# Patient Record
Sex: Male | Born: 1945 | Race: Black or African American | Hispanic: No | Smoking: Former smoker
Health system: Southern US, Community
[De-identification: ages and names within clinical notes are randomized; demographics above are authoritative.]

## PROBLEM LIST (undated history)

## (undated) DIAGNOSIS — I480 Paroxysmal atrial fibrillation: Secondary | ICD-10-CM

## (undated) DIAGNOSIS — E1151 Type 2 diabetes mellitus with diabetic peripheral angiopathy without gangrene: Secondary | ICD-10-CM

## (undated) DIAGNOSIS — E8809 Other disorders of plasma-protein metabolism, not elsewhere classified: Secondary | ICD-10-CM

## (undated) DIAGNOSIS — I5042 Chronic combined systolic (congestive) and diastolic (congestive) heart failure: Secondary | ICD-10-CM

## (undated) DIAGNOSIS — D649 Anemia, unspecified: Secondary | ICD-10-CM

## (undated) DIAGNOSIS — N183 Chronic kidney disease, stage 3 unspecified: Secondary | ICD-10-CM

## (undated) DIAGNOSIS — K7460 Unspecified cirrhosis of liver: Secondary | ICD-10-CM

## (undated) DIAGNOSIS — I639 Cerebral infarction, unspecified: Secondary | ICD-10-CM

## (undated) DIAGNOSIS — I340 Nonrheumatic mitral (valve) insufficiency: Secondary | ICD-10-CM

## (undated) DIAGNOSIS — Z794 Long term (current) use of insulin: Secondary | ICD-10-CM

## (undated) DIAGNOSIS — IMO0001 Reserved for inherently not codable concepts without codable children: Secondary | ICD-10-CM

## (undated) DIAGNOSIS — Z8619 Personal history of other infectious and parasitic diseases: Secondary | ICD-10-CM

## (undated) DIAGNOSIS — R809 Proteinuria, unspecified: Secondary | ICD-10-CM

## (undated) DIAGNOSIS — G629 Polyneuropathy, unspecified: Secondary | ICD-10-CM

## (undated) DIAGNOSIS — E119 Type 2 diabetes mellitus without complications: Secondary | ICD-10-CM

## (undated) DIAGNOSIS — I2510 Atherosclerotic heart disease of native coronary artery without angina pectoris: Secondary | ICD-10-CM

## (undated) DIAGNOSIS — I739 Peripheral vascular disease, unspecified: Secondary | ICD-10-CM

## (undated) HISTORY — PX: BELOW KNEE LEG AMPUTATION: SUR23

## (undated) HISTORY — DX: Reserved for inherently not codable concepts without codable children: IMO0001

## (undated) HISTORY — DX: Anemia, unspecified: D64.9

## (undated) HISTORY — PX: STERNOTOMY: SHX1057

## (undated) HISTORY — DX: Peripheral vascular disease, unspecified: I73.9

## (undated) HISTORY — DX: Other disorders of plasma-protein metabolism, not elsewhere classified: E88.09

## (undated) HISTORY — DX: Personal history of other infectious and parasitic diseases: Z86.19

## (undated) HISTORY — DX: Polyneuropathy, unspecified: G62.9

## (undated) HISTORY — DX: Type 2 diabetes mellitus with diabetic peripheral angiopathy without gangrene: E11.51

## (undated) HISTORY — DX: Unspecified cirrhosis of liver: K74.60

## (undated) HISTORY — DX: Cerebral infarction, unspecified: I63.9

## (undated) HISTORY — DX: Long term (current) use of insulin: Z79.4

## (undated) HISTORY — DX: Paroxysmal atrial fibrillation: I48.0

## (undated) HISTORY — DX: Type 2 diabetes mellitus without complications: E11.9

## (undated) HISTORY — DX: Chronic kidney disease, stage 3 (moderate): N18.3

## (undated) HISTORY — DX: Chronic combined systolic (congestive) and diastolic (congestive) heart failure: I50.42

## (undated) HISTORY — DX: Atherosclerotic heart disease of native coronary artery without angina pectoris: I25.10

## (undated) HISTORY — DX: Chronic kidney disease, stage 3 unspecified: N18.30

## (undated) HISTORY — DX: Nonrheumatic mitral (valve) insufficiency: I34.0

## (undated) HISTORY — DX: Proteinuria, unspecified: R80.9

---
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
## 2003-04-15 HISTORY — PX: CORONARY ARTERY BYPASS GRAFT: SHX141

## 2015-12-25 ENCOUNTER — Encounter: Payer: Self-pay | Admitting: Vascular Surgery

## 2015-12-26 ENCOUNTER — Emergency Department (HOSPITAL_COMMUNITY): Payer: Medicare Other

## 2015-12-26 ENCOUNTER — Inpatient Hospital Stay (HOSPITAL_COMMUNITY)
Admission: EM | Admit: 2015-12-26 | Discharge: 2016-01-14 | DRG: 474 | Disposition: A | Discharge status: Skilled Nursing Facility | Payer: Medicare Other | Attending: Internal Medicine | Admitting: Internal Medicine

## 2015-12-26 DIAGNOSIS — T148 Other injury of unspecified body region: Secondary | ICD-10-CM | POA: Diagnosis not present

## 2015-12-26 DIAGNOSIS — N390 Urinary tract infection, site not specified: Secondary | ICD-10-CM | POA: Diagnosis present

## 2015-12-26 DIAGNOSIS — B182 Chronic viral hepatitis C: Secondary | ICD-10-CM | POA: Diagnosis present

## 2015-12-26 DIAGNOSIS — A09 Infectious gastroenteritis and colitis, unspecified: Secondary | ICD-10-CM | POA: Diagnosis present

## 2015-12-26 DIAGNOSIS — G8918 Other acute postprocedural pain: Secondary | ICD-10-CM

## 2015-12-26 DIAGNOSIS — IMO0002 Reserved for concepts with insufficient information to code with codable children: Secondary | ICD-10-CM

## 2015-12-26 DIAGNOSIS — F17210 Nicotine dependence, cigarettes, uncomplicated: Secondary | ICD-10-CM | POA: Diagnosis present

## 2015-12-26 DIAGNOSIS — Z7901 Long term (current) use of anticoagulants: Secondary | ICD-10-CM

## 2015-12-26 DIAGNOSIS — E118 Type 2 diabetes mellitus with unspecified complications: Secondary | ICD-10-CM

## 2015-12-26 DIAGNOSIS — Z951 Presence of aortocoronary bypass graft: Secondary | ICD-10-CM

## 2015-12-26 DIAGNOSIS — R609 Edema, unspecified: Secondary | ICD-10-CM | POA: Diagnosis present

## 2015-12-26 DIAGNOSIS — I25810 Atherosclerosis of coronary artery bypass graft(s) without angina pectoris: Secondary | ICD-10-CM | POA: Diagnosis not present

## 2015-12-26 DIAGNOSIS — I10 Essential (primary) hypertension: Secondary | ICD-10-CM | POA: Diagnosis present

## 2015-12-26 DIAGNOSIS — R197 Diarrhea, unspecified: Secondary | ICD-10-CM

## 2015-12-26 DIAGNOSIS — A488 Other specified bacterial diseases: Secondary | ICD-10-CM

## 2015-12-26 DIAGNOSIS — E1165 Type 2 diabetes mellitus with hyperglycemia: Secondary | ICD-10-CM

## 2015-12-26 DIAGNOSIS — W19XXXA Unspecified fall, initial encounter: Secondary | ICD-10-CM | POA: Diagnosis present

## 2015-12-26 DIAGNOSIS — I871 Compression of vein: Secondary | ICD-10-CM | POA: Diagnosis present

## 2015-12-26 DIAGNOSIS — Z79899 Other long term (current) drug therapy: Secondary | ICD-10-CM

## 2015-12-26 DIAGNOSIS — I480 Paroxysmal atrial fibrillation: Secondary | ICD-10-CM | POA: Diagnosis present

## 2015-12-26 DIAGNOSIS — D72829 Elevated white blood cell count, unspecified: Secondary | ICD-10-CM

## 2015-12-26 DIAGNOSIS — I2510 Atherosclerotic heart disease of native coronary artery without angina pectoris: Secondary | ICD-10-CM | POA: Diagnosis present

## 2015-12-26 DIAGNOSIS — A498 Other bacterial infections of unspecified site: Secondary | ICD-10-CM

## 2015-12-26 DIAGNOSIS — E1140 Type 2 diabetes mellitus with diabetic neuropathy, unspecified: Secondary | ICD-10-CM | POA: Diagnosis present

## 2015-12-26 DIAGNOSIS — E039 Hypothyroidism, unspecified: Secondary | ICD-10-CM | POA: Diagnosis present

## 2015-12-26 DIAGNOSIS — I739 Peripheral vascular disease, unspecified: Secondary | ICD-10-CM | POA: Diagnosis not present

## 2015-12-26 DIAGNOSIS — I2583 Coronary atherosclerosis due to lipid rich plaque: Secondary | ICD-10-CM | POA: Diagnosis present

## 2015-12-26 DIAGNOSIS — D649 Anemia, unspecified: Secondary | ICD-10-CM | POA: Diagnosis present

## 2015-12-26 DIAGNOSIS — A4151 Sepsis due to Escherichia coli [E. coli]: Secondary | ICD-10-CM | POA: Diagnosis not present

## 2015-12-26 DIAGNOSIS — J156 Pneumonia due to other aerobic Gram-negative bacteria: Secondary | ICD-10-CM | POA: Diagnosis not present

## 2015-12-26 DIAGNOSIS — E8770 Fluid overload, unspecified: Secondary | ICD-10-CM | POA: Diagnosis not present

## 2015-12-26 DIAGNOSIS — Z8249 Family history of ischemic heart disease and other diseases of the circulatory system: Secondary | ICD-10-CM

## 2015-12-26 DIAGNOSIS — L03116 Cellulitis of left lower limb: Secondary | ICD-10-CM | POA: Diagnosis present

## 2015-12-26 DIAGNOSIS — Z794 Long term (current) use of insulin: Secondary | ICD-10-CM | POA: Diagnosis not present

## 2015-12-26 DIAGNOSIS — A4102 Sepsis due to Methicillin resistant Staphylococcus aureus: Secondary | ICD-10-CM | POA: Diagnosis present

## 2015-12-26 DIAGNOSIS — E1152 Type 2 diabetes mellitus with diabetic peripheral angiopathy with gangrene: Secondary | ICD-10-CM | POA: Diagnosis present

## 2015-12-26 DIAGNOSIS — R000 Tachycardia, unspecified: Secondary | ICD-10-CM

## 2015-12-26 DIAGNOSIS — Z720 Tobacco use: Secondary | ICD-10-CM

## 2015-12-26 DIAGNOSIS — N5089 Other specified disorders of the male genital organs: Secondary | ICD-10-CM | POA: Diagnosis not present

## 2015-12-26 DIAGNOSIS — Z833 Family history of diabetes mellitus: Secondary | ICD-10-CM

## 2015-12-26 DIAGNOSIS — T45515A Adverse effect of anticoagulants, initial encounter: Secondary | ICD-10-CM | POA: Diagnosis present

## 2015-12-26 DIAGNOSIS — E8809 Other disorders of plasma-protein metabolism, not elsewhere classified: Secondary | ICD-10-CM | POA: Diagnosis not present

## 2015-12-26 DIAGNOSIS — L97929 Non-pressure chronic ulcer of unspecified part of left lower leg with unspecified severity: Secondary | ICD-10-CM | POA: Diagnosis present

## 2015-12-26 DIAGNOSIS — N179 Acute kidney failure, unspecified: Secondary | ICD-10-CM | POA: Diagnosis present

## 2015-12-26 DIAGNOSIS — B1910 Unspecified viral hepatitis B without hepatic coma: Secondary | ICD-10-CM | POA: Diagnosis present

## 2015-12-26 DIAGNOSIS — E1151 Type 2 diabetes mellitus with diabetic peripheral angiopathy without gangrene: Secondary | ICD-10-CM | POA: Diagnosis not present

## 2015-12-26 DIAGNOSIS — E11622 Type 2 diabetes mellitus with other skin ulcer: Secondary | ICD-10-CM | POA: Diagnosis present

## 2015-12-26 DIAGNOSIS — T148XXA Other injury of unspecified body region, initial encounter: Secondary | ICD-10-CM

## 2015-12-26 DIAGNOSIS — K7469 Other cirrhosis of liver: Secondary | ICD-10-CM | POA: Diagnosis not present

## 2015-12-26 DIAGNOSIS — B9562 Methicillin resistant Staphylococcus aureus infection as the cause of diseases classified elsewhere: Secondary | ICD-10-CM

## 2015-12-26 DIAGNOSIS — E871 Hypo-osmolality and hyponatremia: Secondary | ICD-10-CM | POA: Diagnosis present

## 2015-12-26 DIAGNOSIS — A419 Sepsis, unspecified organism: Secondary | ICD-10-CM

## 2015-12-26 DIAGNOSIS — T8743 Infection of amputation stump, right lower extremity: Secondary | ICD-10-CM | POA: Diagnosis present

## 2015-12-26 DIAGNOSIS — L089 Local infection of the skin and subcutaneous tissue, unspecified: Secondary | ICD-10-CM

## 2015-12-26 DIAGNOSIS — Z8673 Personal history of transient ischemic attack (TIA), and cerebral infarction without residual deficits: Secondary | ICD-10-CM

## 2015-12-26 DIAGNOSIS — N485 Ulcer of penis: Secondary | ICD-10-CM | POA: Diagnosis present

## 2015-12-26 DIAGNOSIS — T879 Unspecified complications of amputation stump: Secondary | ICD-10-CM | POA: Diagnosis not present

## 2015-12-26 DIAGNOSIS — B9620 Unspecified Escherichia coli [E. coli] as the cause of diseases classified elsewhere: Secondary | ICD-10-CM | POA: Diagnosis present

## 2015-12-26 DIAGNOSIS — E1169 Type 2 diabetes mellitus with other specified complication: Secondary | ICD-10-CM | POA: Diagnosis present

## 2015-12-26 DIAGNOSIS — L03119 Cellulitis of unspecified part of limb: Secondary | ICD-10-CM

## 2015-12-26 DIAGNOSIS — M869 Osteomyelitis, unspecified: Secondary | ICD-10-CM

## 2015-12-26 DIAGNOSIS — I96 Gangrene, not elsewhere classified: Secondary | ICD-10-CM | POA: Diagnosis not present

## 2015-12-26 DIAGNOSIS — K7460 Unspecified cirrhosis of liver: Secondary | ICD-10-CM | POA: Diagnosis present

## 2015-12-26 DIAGNOSIS — B1920 Unspecified viral hepatitis C without hepatic coma: Secondary | ICD-10-CM | POA: Diagnosis present

## 2015-12-26 DIAGNOSIS — M79604 Pain in right leg: Secondary | ICD-10-CM

## 2015-12-26 LAB — COMPREHENSIVE METABOLIC PANEL
ALBUMIN: 2.2 g/dL — AB (ref 3.5–5.0)
ALT: 21 U/L (ref 17–63)
AST: 29 U/L (ref 15–41)
Alkaline Phosphatase: 143 U/L — ABNORMAL HIGH (ref 38–126)
Anion gap: 10 (ref 5–15)
BUN: 55 mg/dL — AB (ref 6–20)
CHLORIDE: 104 mmol/L (ref 101–111)
CO2: 20 mmol/L — ABNORMAL LOW (ref 22–32)
Calcium: 9.2 mg/dL (ref 8.9–10.3)
Creatinine, Ser: 2.04 mg/dL — ABNORMAL HIGH (ref 0.61–1.24)
GFR calc Af Amer: 37 mL/min — ABNORMAL LOW (ref >60)
GFR calc non Af Amer: 32 mL/min — ABNORMAL LOW (ref >60)
GLUCOSE: 452 mg/dL — AB (ref 65–99)
POTASSIUM: 4.9 mmol/L (ref 3.5–5.1)
Sodium: 134 mmol/L — ABNORMAL LOW (ref 135–145)
Total Bilirubin: 1 mg/dL (ref 0.3–1.2)
Total Protein: 8.1 g/dL (ref 6.5–8.1)

## 2015-12-26 LAB — CBC WITH DIFFERENTIAL/PLATELET
BASOS ABS: 0 10*3/uL (ref 0.0–0.1)
BASOS PCT: 0 %
EOS PCT: 0 %
Eosinophils Absolute: 0 10*3/uL (ref 0.0–0.7)
HEMATOCRIT: 36.4 % — AB (ref 39.0–52.0)
HEMOGLOBIN: 12.2 g/dL — AB (ref 13.0–17.0)
LYMPHS PCT: 5 %
Lymphs Abs: 1.3 10*3/uL (ref 0.7–4.0)
MCH: 30 pg (ref 26.0–34.0)
MCHC: 33.5 g/dL (ref 30.0–36.0)
MCV: 89.4 fL (ref 78.0–100.0)
MONOS PCT: 6 %
Monocytes Absolute: 1.6 10*3/uL — ABNORMAL HIGH (ref 0.1–1.0)
NEUTROS ABS: 23.2 10*3/uL — AB (ref 1.7–7.7)
Neutrophils Relative %: 89 %
PLATELETS: 337 10*3/uL (ref 150–400)
RBC: 4.07 MIL/uL — ABNORMAL LOW (ref 4.22–5.81)
RDW: 13.2 % (ref 11.5–15.5)
WBC: 26.1 10*3/uL — ABNORMAL HIGH (ref 4.0–10.5)

## 2015-12-26 LAB — URINE MICROSCOPIC-ADD ON

## 2015-12-26 LAB — URINALYSIS, ROUTINE W REFLEX MICROSCOPIC
Bilirubin Urine: NEGATIVE
Glucose, UA: 250 mg/dL — AB
HGB URINE DIPSTICK: NEGATIVE
KETONES UR: NEGATIVE mg/dL
Nitrite: NEGATIVE
PROTEIN: 100 mg/dL — AB
Specific Gravity, Urine: 1.018 (ref 1.005–1.030)
pH: 5 (ref 5.0–8.0)

## 2015-12-26 LAB — I-STAT CG4 LACTIC ACID, ED: Lactic Acid, Venous: 1.54 mmol/L (ref 0.5–1.9)

## 2015-12-26 LAB — SEDIMENTATION RATE: Sed Rate: 107 mm/hr — ABNORMAL HIGH (ref 0–16)

## 2015-12-26 LAB — C-REACTIVE PROTEIN: CRP: 22.4 mg/dL — AB (ref <1.0)

## 2015-12-26 MED ORDER — ACETAMINOPHEN 650 MG RE SUPP
650.0000 mg | Freq: Once | RECTAL | Status: AC
  Administered 2015-12-26: 650 mg via RECTAL

## 2015-12-26 MED ORDER — SODIUM CHLORIDE 0.9 % IV BOLUS (SEPSIS)
1000.0000 mL | Freq: Once | INTRAVENOUS | Status: AC
  Administered 2015-12-26: 1000 mL via INTRAVENOUS

## 2015-12-26 MED ORDER — VANCOMYCIN HCL IN DEXTROSE 1-5 GM/200ML-% IV SOLN
1000.0000 mg | Freq: Once | INTRAVENOUS | Status: AC
  Administered 2015-12-26: 1000 mg via INTRAVENOUS
  Filled 2015-12-26: qty 200

## 2015-12-26 MED ORDER — PIPERACILLIN-TAZOBACTAM 3.375 G IVPB 30 MIN
3.3750 g | Freq: Once | INTRAVENOUS | Status: AC
  Administered 2015-12-26: 3.375 g via INTRAVENOUS
  Filled 2015-12-26: qty 50

## 2015-12-26 NOTE — ED Triage Notes (Signed)
Per [facility]  EMS:  RBKA, wounds on "stump" and left leg. Pt has had strong discolored urine, EMS saw the urine in a urinal by the pt. Pts mother states that the pt is non compliant with meds and has not been actin himself the last few days. Pt has been alert and oriented with EMS.  Lung sounds clear. Mom states that the pt has not been eat the last couple of days.

## 2015-12-26 NOTE — ED Notes (Addendum)
Delay in lab draw, pt not in room 

## 2015-12-26 NOTE — ED Notes (Addendum)
L pedal pulse palpated. Also, used doppler to verify pulse. Pulse weak. Regular. Marked with pen on top of L foot.

## 2015-12-26 NOTE — ED Notes (Signed)
Per nurse cnc istat lactic acid

## 2015-12-26 NOTE — ED Provider Notes (Signed)
[facility]-EMERGENCY DEPT Provider Note   CSN: [id number] Arrival date & time: 12/26/15  1843     History   Chief Complaint Chief Complaint  Patient presents with  . Fever    HPI Ronald Simpson is a 70 y.o. male.  HPI Patient is a 70 year old male past medical history of BKA of right lower extremity, who comes in today brought by his mother with concern for patient's lack of appetite.  Patient states that since this past Friday has had no appetite and has not been eating.  Patient additionally states he has not been taking his medications.  When asked why patient stated that he felt like he was on too many medications.  Patient additionally complains of pain of the stump of the right leg.  Patient states that he had a fall earlier this week and injured his stump.  Patient additionally complains of pain to the first second and third toes of the left foot.  Patient noted to have foul-smelling urine in triage.  Patient febrile on arrival with temperature of greater than 102 rectally.  Patient additionally tachycardic but normotensive.  Patient endorses fevers chills but denies nausea vomiting diarrhea or abdominal pain.  Patient denies chest pain or shortness of breath.  Patient denies dysuria. Past Medical History:  Diagnosis Date  . CAD (coronary artery disease)   . CVA (cerebral infarction)   . Diabetes mellitus without complication (HCC)   . History of hepatitis B   . History of hepatitis C   . Peripheral vascular disease [facility])     Patient Active Problem List   Diagnosis Date Noted  . Sepsis (HCC) 12/26/2015    Past Surgical History:  Procedure Laterality Date  . BELOW KNEE LEG AMPUTATION Right   . CORONARY ARTERY BYPASS GRAFT  2005  . STERNOTOMY         Home Medications    Prior to Admission medications   Medication Sig Start Date End Date Taking? Authorizing Provider  amitriptyline (ELAVIL) 25 MG tablet Take 25 mg by mouth at bedtime.   Yes Historical Provider, MD    amLODipine (NORVASC) 10 MG tablet Take 10 mg by mouth daily.   Yes Historical Provider, MD  atorvastatin (LIPITOR) 40 MG tablet Take 40 mg by mouth daily.   Yes Historical Provider, MD  carvedilol (COREG) 6.25 MG tablet Take 6.25 mg by mouth 2 (two) times daily with a meal.   Yes Historical Provider, MD  ferrous sulfate 325 (65 FE) MG tablet Take 325 mg by mouth 2 (two) times daily with a meal.   Yes Historical Provider, MD  furosemide (LASIX) 20 MG tablet Take 20 mg by mouth daily.   Yes Historical Provider, MD  hydrALAZINE (APRESOLINE) 25 MG tablet Take 25 mg by mouth 3 (three) times daily.   Yes Historical Provider, MD  insulin glargine (LANTUS) 100 unit/mL SOPN Inject 17 Units into the skin at bedtime.   Yes Historical Provider, MD  levothyroxine (SYNTHROID, LEVOTHROID) 25 MCG tablet Take 25 mcg by mouth daily before breakfast.   Yes Historical Provider, MD  lisinopril (PRINIVIL,ZESTRIL) 10 MG tablet Take 10 mg by mouth daily.   Yes Historical Provider, MD  nitroGLYCERIN (NITROSTAT) 0.4 MG SL tablet Place 0.4 mg under the tongue every 5 (five) minutes as needed for chest pain.   Yes Historical Provider, MD  oxyCODONE (OXY IR/ROXICODONE) 5 MG immediate release tablet Take 5 mg by mouth every 4 (four) hours as needed for severe pain.   Yes Historical  Provider, MD  pregabalin (LYRICA) 50 MG capsule Take 100-150 mg by mouth 3 (three) times daily. Take 10mg  every morning and 50mg  at 1400 and bedtime   Yes Historical Provider, MD  spironolactone (ALDACTONE) 25 MG tablet Take 25 mg by mouth daily.   Yes Historical Provider, MD  warfarin (COUMADIN) 3 MG tablet Take 7 mg by mouth daily. Takes along with a 4mg  tablet   Yes Historical Provider, MD    Family History No family history on file.  Social History Social History  Substance Use Topics  . Smoking status: Current Every Day Smoker    Packs/day: 0.50  . Smokeless tobacco: Never Used  . Alcohol use Not on file     Allergies   Review of  patient's allergies indicates no known allergies.   Review of Systems Review of Systems  Constitutional: Positive for appetite change, chills, fatigue and fever.  Respiratory: Negative for chest tightness and shortness of breath.   Cardiovascular: Negative for chest pain.  Gastrointestinal: Negative for abdominal pain.  Genitourinary: Positive for difficulty urinating. Negative for discharge, flank pain and hematuria.     Physical Exam Updated Vital Signs BP 137/88   Pulse 84   Temp 99.9 F (37.7 C) (Oral)   Resp 17   SpO2 100%   Physical Exam  Constitutional: He appears well-developed and well-nourished.  HENT:  Head: Normocephalic and atraumatic.  Eyes: Conjunctivae are normal.  Neck: Neck supple.  Cardiovascular: Normal rate and regular rhythm.   No murmur heard. Pulmonary/Chest: Effort normal and breath sounds normal. No respiratory distress.  Abdominal: Soft. There is no tenderness.  Musculoskeletal: He exhibits no edema.  Neurological: He is alert.  Skin: Skin is warm and dry.  As in MDM  Psychiatric: He has a normal mood and affect.  Nursing note and vitals reviewed.    ED Treatments / Results  Labs (all labs ordered are listed, but only abnormal results are displayed) Labs Reviewed  COMPREHENSIVE METABOLIC PANEL - Abnormal; Notable for the following:       Result Value   Sodium 134 (*)    CO2 20 (*)    Glucose, Bld 452 (*)    BUN 55 (*)    Creatinine, Ser 2.04 (*)    Albumin 2.2 (*)    Alkaline Phosphatase 143 (*)    GFR calc non Af Amer 32 (*)    GFR calc Af Amer 37 (*)    All other components within normal limits  CBC WITH DIFFERENTIAL/PLATELET - Abnormal; Notable for the following:    WBC 26.1 (*)    RBC 4.07 (*)    Hemoglobin 12.2 (*)    HCT 36.4 (*)    Neutro Abs 23.2 (*)    Monocytes Absolute 1.6 (*)    All other components within normal limits  URINALYSIS, ROUTINE W REFLEX MICROSCOPIC (NOT AT [facility]) - Abnormal; Notable for the following:     Color, Urine AMBER (*)    APPearance CLOUDY (*)    Glucose, UA 250 (*)    Protein, ur 100 (*)    Leukocytes, UA LARGE (*)    All other components within normal limits  URINE MICROSCOPIC-ADD ON - Abnormal; Notable for the following:    Squamous Epithelial / LPF 0-5 (*)    Bacteria, UA RARE (*)    All other components within normal limits  SEDIMENTATION RATE - Abnormal; Notable for the following:    Sed Rate 107 (*)    All other components within  normal limits  C-REACTIVE PROTEIN - Abnormal; Notable for the following:    CRP 22.4 (*)    All other components within normal limits  CULTURE, BLOOD (ROUTINE X 2)  CULTURE, BLOOD (ROUTINE X 2)  URINE CULTURE  I-STAT CG4 LACTIC ACID, ED  I-STAT CG4 LACTIC ACID, ED    EKG  EKG Interpretation None       Radiology Dg Chest 2 View  Result Date: 12/26/2015 CLINICAL DATA:  Chest pain, weakness, possible sepsis. EXAM: CHEST  2 VIEW COMPARISON:  None. FINDINGS: Patient is post median sternotomy with prosthetic valve. Lung volumes are low. Mediastinal contours are normal for low volume AP technique. No consolidation. No pulmonary edema, pleural effusion or pneumothorax. No acute osseous abnormalities seen. IMPRESSION: No active cardiopulmonary disease. Electronically Signed   By: Rubye Oaks M.D.   On: 12/26/2015 20:42   Dg Knee 2 Views Right  Result Date: 12/26/2015 CLINICAL DATA:  Stump infection. EXAM: RIGHT KNEE - 1-2 VIEW COMPARISON:  Radiographs 6 days prior. FINDINGS: Post below-the-knee amputation with unchanged soft tissue edema of the stump soft tissues. Resection margins remain smooth without bony destructive change. No periosteal reaction. There are dense vascular calcifications, unchanged from prior. Presumed metallic foreign bodies again seen. No tracking soft tissue air. Age medullary rod in the distal femur is partially included. Development of small knee joint effusion. Mild degenerative change about the knee is again seen.  IMPRESSION: Post right below-the-knee amputation with soft tissue edema of the distal stump. No tracking soft tissue air. No radiographic findings of osteomyelitis. Electronically Signed   By: Rubye Oaks M.D.   On: 12/26/2015 22:06   Dg Foot Complete Left  Result Date: 12/26/2015 CLINICAL DATA:  Stump infection.  Peripheral vascular disease. EXAM: LEFT FOOT - COMPLETE 3+ VIEW COMPARISON:  12/20/2015 FINDINGS: Negative for acute fracture or dislocation. Multiple metallic foreign bodies are again evident about the foot and ankle. Moderate degenerative changes are present at the first MTP. No bony destruction. No soft tissue gas. Extensive vascular calcifications. IMPRESSION: No bony destruction to confirm osteomyelitis.  No soft tissue gas. Electronically Signed   By: Ellery Plunk M.D.   On: 12/26/2015 22:04    Procedures Procedures (including critical care time)  Medications Ordered in ED Medications  acetaminophen (TYLENOL) suppository 650 mg (650 mg Rectal Given 12/26/15 1923)  vancomycin (VANCOCIN) IVPB 1000 mg/200 mL premix (0 mg Intravenous Stopped 12/26/15 2348)  sodium chloride 0.9 % bolus 1,000 mL (1,000 mLs Intravenous New Bag/Given 12/26/15 2211)  piperacillin-tazobactam (ZOSYN) IVPB 3.375 g (3.375 g Intravenous New Bag/Given 12/26/15 2349)     Initial Impression / Assessment and Plan / ED Course  I have reviewed the triage vital signs and the nursing notes.  Pertinent labs & imaging results that were available during my care of the patient were reviewed by me and considered in my medical decision making (see chart for details).  Clinical Course   Patient is a 70 year old male past medical history of BKA of right lower extremity, who comes in today brought by his mother with concern for patient's lack of appetite.  Patient states that since this past Friday has had no appetite and has not been eating.  Patient additionally states he has not been taking his medications.  When  asked why patient stated that he felt like he was on too many medications.  Patient additionally complains of pain of the stump of the right leg.  Patient states that he had a fall earlier  this week and injured his stump.  Patient additionally complains of pain to the first second and third toes of the left foot.  Patient noted to have foul-smelling urine in triage.  Patient febrile on arrival with temperature of greater than 102 rectally.  Patient additionally tachycardic but normotensive.  Patient endorses fevers chills but denies nausea vomiting diarrhea or abdominal pain.  Patient denies chest pain or shortness of breath.  Patient denies dysuria.  Physical exam patient clear to auscultation bilaterally normal S1-S2 no rubs murmurs gallops abdomen soft nontender.  Examination of patient's stump shows abrasions and scabbing across the anterior portion.  There is some noted erythema.  Examination of the toes of the left foot reveal likely necrotic dry lesions on the first second and third toes.  Remainder physical exam within normal limits.  We will initiate code sepsis and broad workup.  Laboratory results show elevated CRP and sed rate, urine concerning for infection, multiple electrolyte derangements, hyperglycemia and AK I.  Patient additionally found to have leukocytosis.  X-rays of patient's stump and left foot showed no signs concerning of osteo-.  Patient started empirically on vancomycin and Zosyn.  Patient admitted to hospitalist for likely urosepsis.    Final Clinical Impressions(s) / ED Diagnoses   Final diagnoses:  None    New Prescriptions New Prescriptions   No medications on file     Caren Griffins, MD 12/27/15 0030    Zadie Rhine, MD 12/27/15 2352

## 2015-12-26 NOTE — ED Provider Notes (Signed)
Patient seen/examined in the Emergency Department in conjunction with Resident Physician Provider Luckey Patient presents with fever/fatigue.   Exam : sleeping but arousable.  S/p right BKA.  He has necrotic toe on left foot.  Distal pulse intact on left LE. Plan: pt found to have UTI, will need admission Patient has been hemodynamically appropriate in the ED     Zadie Rhineonald Alechia Lezama, MD 12/26/15 2354

## 2015-12-26 NOTE — ED Notes (Signed)
Patient just returned to room from xray.

## 2015-12-27 ENCOUNTER — Encounter (HOSPITAL_COMMUNITY): Payer: Medicare Other

## 2015-12-27 ENCOUNTER — Inpatient Hospital Stay (HOSPITAL_COMMUNITY): Payer: Medicare Other

## 2015-12-27 ENCOUNTER — Encounter (HOSPITAL_COMMUNITY): Payer: Self-pay | Admitting: Family Medicine

## 2015-12-27 DIAGNOSIS — E1151 Type 2 diabetes mellitus with diabetic peripheral angiopathy without gangrene: Secondary | ICD-10-CM

## 2015-12-27 DIAGNOSIS — I10 Essential (primary) hypertension: Secondary | ICD-10-CM | POA: Diagnosis present

## 2015-12-27 DIAGNOSIS — B1920 Unspecified viral hepatitis C without hepatic coma: Secondary | ICD-10-CM | POA: Diagnosis present

## 2015-12-27 DIAGNOSIS — N179 Acute kidney failure, unspecified: Secondary | ICD-10-CM | POA: Diagnosis present

## 2015-12-27 DIAGNOSIS — A419 Sepsis, unspecified organism: Secondary | ICD-10-CM

## 2015-12-27 DIAGNOSIS — E039 Hypothyroidism, unspecified: Secondary | ICD-10-CM

## 2015-12-27 DIAGNOSIS — Z8673 Personal history of transient ischemic attack (TIA), and cerebral infarction without residual deficits: Secondary | ICD-10-CM

## 2015-12-27 DIAGNOSIS — N5089 Other specified disorders of the male genital organs: Secondary | ICD-10-CM | POA: Diagnosis present

## 2015-12-27 DIAGNOSIS — I2510 Atherosclerotic heart disease of native coronary artery without angina pectoris: Secondary | ICD-10-CM

## 2015-12-27 DIAGNOSIS — I2583 Coronary atherosclerosis due to lipid rich plaque: Secondary | ICD-10-CM

## 2015-12-27 DIAGNOSIS — B182 Chronic viral hepatitis C: Secondary | ICD-10-CM

## 2015-12-27 LAB — GLUCOSE, CAPILLARY
GLUCOSE-CAPILLARY: 334 mg/dL — AB (ref 65–99)
GLUCOSE-CAPILLARY: 153 mg/dL — AB (ref 65–99)
Glucose-Capillary: 390 mg/dL — ABNORMAL HIGH (ref 65–99)
Glucose-Capillary: 144 mg/dL — ABNORMAL HIGH (ref 65–99)
Glucose-Capillary: 184 mg/dL — ABNORMAL HIGH (ref 65–99)

## 2015-12-27 LAB — PROTIME-INR
INR: 4.82
PROTHROMBIN TIME: 46.5 s — AB (ref 11.4–15.2)

## 2015-12-27 LAB — COMPREHENSIVE METABOLIC PANEL
ALBUMIN: 2 g/dL — AB (ref 3.5–5.0)
ALK PHOS: 139 U/L — AB (ref 38–126)
ALT: 26 U/L (ref 17–63)
ANION GAP: 8 (ref 5–15)
AST: 51 U/L — ABNORMAL HIGH (ref 15–41)
BILIRUBIN TOTAL: 1.1 mg/dL (ref 0.3–1.2)
BUN: 50 mg/dL — ABNORMAL HIGH (ref 6–20)
CALCIUM: 8.8 mg/dL — AB (ref 8.9–10.3)
CO2: 21 mmol/L — ABNORMAL LOW (ref 22–32)
Chloride: 104 mmol/L (ref 101–111)
Creatinine, Ser: 1.66 mg/dL — ABNORMAL HIGH (ref 0.61–1.24)
GFR, EST AFRICAN AMERICAN: 47 mL/min — AB (ref >60)
GFR, EST NON AFRICAN AMERICAN: 41 mL/min — AB (ref >60)
Glucose, Bld: 331 mg/dL — ABNORMAL HIGH (ref 65–99)
POTASSIUM: 4.3 mmol/L (ref 3.5–5.1)
Sodium: 133 mmol/L — ABNORMAL LOW (ref 135–145)
TOTAL PROTEIN: 8 g/dL (ref 6.5–8.1)

## 2015-12-27 LAB — CREATININE, URINE, RANDOM: CREATININE, URINE: 128.95 mg/dL

## 2015-12-27 LAB — CBC
HEMATOCRIT: 34.7 % — AB (ref 39.0–52.0)
HEMOGLOBIN: 11.4 g/dL — AB (ref 13.0–17.0)
MCH: 29.8 pg (ref 26.0–34.0)
MCHC: 32.9 g/dL (ref 30.0–36.0)
MCV: 90.8 fL (ref 78.0–100.0)
Platelets: 354 10*3/uL (ref 150–400)
RBC: 3.82 MIL/uL — AB (ref 4.22–5.81)
RDW: 13.3 % (ref 11.5–15.5)
WBC: 24.5 10*3/uL — AB (ref 4.0–10.5)

## 2015-12-27 LAB — C DIFFICILE QUICK SCREEN W PCR REFLEX
C DIFFICILE (CDIFF) TOXIN: NEGATIVE
C DIFFICLE (CDIFF) ANTIGEN: NEGATIVE
C Diff interpretation: NOT DETECTED

## 2015-12-27 LAB — PREALBUMIN: PREALBUMIN: 3 mg/dL — AB (ref 18–38)

## 2015-12-27 LAB — SODIUM, URINE, RANDOM: SODIUM UR: 31 mmol/L

## 2015-12-27 MED ORDER — VANCOMYCIN HCL IN DEXTROSE 1-5 GM/200ML-% IV SOLN: 1000.0000 mg | INTRAVENOUS | Status: DC

## 2015-12-27 MED ORDER — VANCOMYCIN 50 MG/ML ORAL SOLUTION: 125.0000 mg | Freq: Four times a day (QID) | ORAL | Status: DC

## 2015-12-27 MED ORDER — ONDANSETRON HCL 4 MG PO TABS: 4.0000 mg | ORAL_TABLET | Freq: Four times a day (QID) | ORAL | Status: DC | PRN

## 2015-12-27 MED ORDER — OXYCODONE HCL 5 MG PO TABS
5.0000 mg | ORAL_TABLET | Freq: Four times a day (QID) | ORAL | Status: DC | PRN
  Administered 2015-12-27 – 2016-01-05 (×18): 5 mg via ORAL
  Filled 2015-12-27 (×19): qty 1

## 2015-12-27 MED ORDER — LEVOTHYROXINE SODIUM 25 MCG PO TABS
25.0000 ug | ORAL_TABLET | Freq: Every day | ORAL | Status: DC
  Administered 2015-12-27 – 2016-01-14 (×17): 25 ug via ORAL
  Filled 2015-12-27 (×17): qty 1

## 2015-12-27 MED ORDER — INSULIN ASPART 100 UNIT/ML ~~LOC~~ SOLN
0.0000 [IU] | Freq: Every day | SUBCUTANEOUS | Status: DC
  Administered 2015-12-27: 5 [IU] via SUBCUTANEOUS

## 2015-12-27 MED ORDER — HYDRALAZINE HCL 25 MG PO TABS
25.0000 mg | ORAL_TABLET | Freq: Three times a day (TID) | ORAL | Status: DC
  Administered 2015-12-27 – 2016-01-14 (×54): 25 mg via ORAL
  Filled 2015-12-27 (×55): qty 1

## 2015-12-27 MED ORDER — FERROUS SULFATE 325 (65 FE) MG PO TABS
325.0000 mg | ORAL_TABLET | Freq: Two times a day (BID) | ORAL | Status: DC
  Administered 2015-12-27 – 2016-01-14 (×35): 325 mg via ORAL
  Filled 2015-12-27 (×35): qty 1

## 2015-12-27 MED ORDER — DEXTROSE 5 % IV SOLN
2.0000 g | INTRAVENOUS | Status: DC
  Administered 2015-12-27 – 2015-12-28 (×2): 2 g via INTRAVENOUS
  Filled 2015-12-27 (×3): qty 2

## 2015-12-27 MED ORDER — PREGABALIN 100 MG PO CAPS: 100.0000 mg | ORAL_CAPSULE | Freq: Three times a day (TID) | ORAL | Status: DC

## 2015-12-27 MED ORDER — WARFARIN SODIUM 6 MG PO TABS: 7.0000 mg | ORAL_TABLET | Freq: Every day | ORAL | Status: DC

## 2015-12-27 MED ORDER — ACETAMINOPHEN 650 MG RE SUPP: 650.0000 mg | Freq: Four times a day (QID) | RECTAL | Status: DC | PRN

## 2015-12-27 MED ORDER — INSULIN GLARGINE 100 UNIT/ML ~~LOC~~ SOLN
17.0000 [IU] | Freq: Every day | SUBCUTANEOUS | Status: DC
  Administered 2015-12-27 – 2016-01-13 (×19): 17 [IU] via SUBCUTANEOUS
  Filled 2015-12-27 (×20): qty 0.17

## 2015-12-27 MED ORDER — INSULIN ASPART 100 UNIT/ML ~~LOC~~ SOLN
0.0000 [IU] | Freq: Three times a day (TID) | SUBCUTANEOUS | Status: DC
Start: 2015-12-27 — End: 2016-01-14
  Administered 2015-12-27: 11 [IU] via SUBCUTANEOUS
  Administered 2015-12-27: 3 [IU] via SUBCUTANEOUS
  Administered 2015-12-27: 2 [IU] via SUBCUTANEOUS
  Administered 2015-12-28: 5 [IU] via SUBCUTANEOUS
  Administered 2015-12-28: 3 [IU] via SUBCUTANEOUS
  Administered 2015-12-28 – 2015-12-29 (×2): 5 [IU] via SUBCUTANEOUS
  Administered 2015-12-29 (×2): 3 [IU] via SUBCUTANEOUS
  Administered 2015-12-30: 2 [IU] via SUBCUTANEOUS
  Administered 2015-12-30: 3 [IU] via SUBCUTANEOUS
  Administered 2015-12-30: 2 [IU] via SUBCUTANEOUS
  Administered 2015-12-31: 3 [IU] via SUBCUTANEOUS
  Administered 2015-12-31: 5 [IU] via SUBCUTANEOUS
  Administered 2015-12-31: 2 [IU] via SUBCUTANEOUS
  Administered 2016-01-01 (×2): 3 [IU] via SUBCUTANEOUS
  Administered 2016-01-01: 2 [IU] via SUBCUTANEOUS
  Administered 2016-01-02 (×2): 3 [IU] via SUBCUTANEOUS
  Administered 2016-01-02: 5 [IU] via SUBCUTANEOUS
  Administered 2016-01-03: 2 [IU] via SUBCUTANEOUS
  Administered 2016-01-03 – 2016-01-04 (×2): 3 [IU] via SUBCUTANEOUS
  Administered 2016-01-04: 2 [IU] via SUBCUTANEOUS
  Administered 2016-01-04 – 2016-01-05 (×2): 3 [IU] via SUBCUTANEOUS
  Administered 2016-01-05 – 2016-01-06 (×4): 2 [IU] via SUBCUTANEOUS
  Administered 2016-01-07 – 2016-01-08 (×2): 3 [IU] via SUBCUTANEOUS
  Administered 2016-01-08: 2 [IU] via SUBCUTANEOUS
  Administered 2016-01-08: 3 [IU] via SUBCUTANEOUS
  Administered 2016-01-09: 2 [IU] via SUBCUTANEOUS
  Administered 2016-01-11: 5 [IU] via SUBCUTANEOUS
  Administered 2016-01-12 (×2): 2 [IU] via SUBCUTANEOUS
  Administered 2016-01-13: 3 [IU] via SUBCUTANEOUS

## 2015-12-27 MED ORDER — PREGABALIN 75 MG PO CAPS
150.0000 mg | ORAL_CAPSULE | Freq: Two times a day (BID) | ORAL | Status: DC
  Administered 2015-12-27 – 2016-01-13 (×35): 150 mg via ORAL
  Filled 2015-12-27 (×36): qty 2

## 2015-12-27 MED ORDER — CARVEDILOL 6.25 MG PO TABS
6.2500 mg | ORAL_TABLET | Freq: Two times a day (BID) | ORAL | Status: DC
  Administered 2015-12-27 – 2016-01-14 (×34): 6.25 mg via ORAL
  Filled 2015-12-27 (×34): qty 1

## 2015-12-27 MED ORDER — PIPERACILLIN-TAZOBACTAM 3.375 G IVPB
3.3750 g | Freq: Three times a day (TID) | INTRAVENOUS | Status: DC
  Filled 2015-12-27 (×2): qty 50

## 2015-12-27 MED ORDER — WARFARIN - PHARMACIST DOSING INPATIENT
Freq: Every day | Status: DC
  Administered 2015-12-29 – 2015-12-30 (×2)

## 2015-12-27 MED ORDER — ONDANSETRON HCL 4 MG/2ML IJ SOLN
4.0000 mg | Freq: Four times a day (QID) | INTRAMUSCULAR | Status: DC | PRN
  Administered 2016-01-07: 4 mg via INTRAVENOUS

## 2015-12-27 MED ORDER — INSULIN GLARGINE 100 UNITS/ML SOLOSTAR PEN: 17.0000 [IU] | PEN_INJECTOR | Freq: Every day | SUBCUTANEOUS | Status: DC

## 2015-12-27 MED ORDER — ATORVASTATIN CALCIUM 40 MG PO TABS
40.0000 mg | ORAL_TABLET | Freq: Every day | ORAL | Status: DC
  Administered 2015-12-27 – 2016-01-14 (×18): 40 mg via ORAL
  Filled 2015-12-27 (×19): qty 1

## 2015-12-27 MED ORDER — AMITRIPTYLINE HCL 25 MG PO TABS
25.0000 mg | ORAL_TABLET | Freq: Every day | ORAL | Status: DC
  Administered 2015-12-27 – 2016-01-13 (×18): 25 mg via ORAL
  Filled 2015-12-27 (×18): qty 1

## 2015-12-27 MED ORDER — ACETAMINOPHEN 325 MG PO TABS
650.0000 mg | ORAL_TABLET | Freq: Four times a day (QID) | ORAL | Status: DC | PRN
Start: 2015-12-27 — End: 2016-01-14
  Administered 2015-12-28 – 2016-01-08 (×2): 650 mg via ORAL
  Filled 2015-12-27 (×2): qty 2

## 2015-12-27 MED ORDER — PREGABALIN 100 MG PO CAPS
100.0000 mg | ORAL_CAPSULE | Freq: Every day | ORAL | Status: DC
  Administered 2015-12-27 – 2016-01-14 (×19): 100 mg via ORAL
  Filled 2015-12-27 (×20): qty 1

## 2015-12-27 MED ORDER — SODIUM CHLORIDE 0.9 % IV SOLN
INTRAVENOUS | Status: DC
  Administered 2015-12-27: 18:00:00 via INTRAVENOUS
  Administered 2015-12-27: 1000 mL via INTRAVENOUS
  Administered 2015-12-27 – 2016-01-03 (×7): via INTRAVENOUS
  Administered 2016-01-04: 75 mL/h via INTRAVENOUS
  Administered 2016-01-05 – 2016-01-06 (×2): via INTRAVENOUS

## 2015-12-27 MED ORDER — WARFARIN - PHYSICIAN DOSING INPATIENT: Freq: Every day | Status: DC

## 2015-12-27 NOTE — Progress Notes (Signed)
NURSING PROGRESS NOTE  Ronald Simpson [id number] Admission Data: 12/27/2015 3:26 AM Attending Provider: Alberteen Samhristopher P Danford, MD PCP:No PCP Per Patient Code Status: Full  Allergies:  Review of patient's allergies indicates no known allergies. Past Medical History:   has a past medical history of CAD (coronary artery disease); CVA (cerebral infarction); Diabetes mellitus without complication (HCC); History of hepatitis B; History of hepatitis C; and Peripheral vascular disease (HCC). Past Surgical History:   has a past surgical history that includes Below knee leg amputation (Right); Sternotomy; and Coronary artery bypass graft (2005). Social History:   reports that he has been smoking.  He has been smoking about 0.50 packs per day. He has never used smokeless tobacco. He reports that he uses drugs, including Cocaine and Marijuana.  Ronald Simpson is a 70 y.o. male patient admitted from ED:   Last Documented Vital Signs: Blood pressure (!) 145/75, pulse 81, temperature 100.1 F (37.8 C), temperature source Oral, resp. rate 18, height 5\' 10"  (1.778 m), weight 72.4 kg (159 lb 11.2 oz), SpO2 100 %.  IV Fluids:  IV in place, occlusive dsg intact without redness, IV cath hand left, condition patent and no redness normal saline.   Skin: Appropriate for ethnicity with abrasion on right leg "stump" and MASD to buttocks.  Patient/Family orientated to room. Information packet given to patient/family. Admission inpatient armband information verified with patient/family to include name and date of birth and placed on patient arm. Side rails up x 2, fall assessment and education completed with patient/family. Patient/family able to verbalize understanding of risk associated with falls and verbalized understanding to call for assistance before getting out of bed. Call light within reach. Patient/family able to voice and demonstrate understanding of unit orientation instructions.    Will continue to  evaluate and treat per MD orders.  Sue LushKaelin Romesberg RN, BSN

## 2015-12-27 NOTE — Progress Notes (Signed)
ANTICOAGULATION CONSULT NOTE - Initial Consult  Pharmacy Consult for Coumadin Indication: stroke  No Known Allergies  Patient Measurements: Height: 5\' 10"  (177.8 cm) Weight: 159 lb 11.2 oz (72.4 kg) IBW/kg (Calculated) : 73  Vital Signs: Temp: 100.1 F (37.8 C) (09/14 0146) Temp Source: Oral (09/14 0146) BP: 145/75 (09/14 0146) Pulse Rate: 81 (09/14 0146)  Labs:  Recent Labs  12/26/15 1908  HGB 12.2*  HCT 36.4*  PLT 337  CREATININE 2.04*    Estimated Creatinine Clearance: 35.5 mL/min (by C-G formula based on SCr of 2.04 mg/dL (H)).   Medical History: Past Medical History:  Diagnosis Date  . CAD (coronary artery disease)   . CVA (cerebral infarction)   . Diabetes mellitus without complication (HCC)   . History of hepatitis B   . History of hepatitis C   . Peripheral vascular disease (HCC)     Medications:  Prescriptions Prior to Admission  Medication Sig Dispense Refill Last Dose  . amitriptyline (ELAVIL) 25 MG tablet Take 25 mg by mouth at bedtime.   Past Week at Unknown time  . amLODipine (NORVASC) 10 MG tablet Take 10 mg by mouth daily.   12/25/2015 at Unknown time  . atorvastatin (LIPITOR) 40 MG tablet Take 40 mg by mouth daily.   Past Week at Unknown time  . carvedilol (COREG) 6.25 MG tablet Take 6.25 mg by mouth 2 (two) times daily with a meal.   12/25/2015 at 0730  . ferrous sulfate 325 (65 FE) MG tablet Take 325 mg by mouth 2 (two) times daily with a meal.   12/25/2015 at Unknown time  . furosemide (LASIX) 20 MG tablet Take 20 mg by mouth daily.   Past Week at Unknown time  . hydrALAZINE (APRESOLINE) 25 MG tablet Take 25 mg by mouth 3 (three) times daily.   12/25/2015 at Unknown time  . insulin glargine (LANTUS) 100 unit/mL SOPN Inject 17 Units into the skin at bedtime.   12/24/2015  . levothyroxine (SYNTHROID, LEVOTHROID) 25 MCG tablet Take 25 mcg by mouth daily before breakfast.   12/25/2015 at Unknown time  . lisinopril (PRINIVIL,ZESTRIL) 10 MG tablet Take  10 mg by mouth daily.   12/25/2015 at Unknown time  . nitroGLYCERIN (NITROSTAT) 0.4 MG SL tablet Place 0.4 mg under the tongue every 5 (five) minutes as needed for chest pain.   never  . oxyCODONE (OXY IR/ROXICODONE) 5 MG immediate release tablet Take 5 mg by mouth every 4 (four) hours as needed for severe pain.   Past Month at Unknown time  . pregabalin (LYRICA) 50 MG capsule Take 100-150 mg by mouth 3 (three) times daily. Take 100mg  every morning and 150mg  at 1400 and bedtime   12/25/2015 at Unknown time  . spironolactone (ALDACTONE) 25 MG tablet Take 25 mg by mouth daily.   Past Week at Unknown time  . warfarin (COUMADIN) 3 MG tablet Take 7 mg by mouth daily. Takes along with a 4mg  tablet   12/24/2015   Scheduled:  . amitriptyline  25 mg Oral QHS  . atorvastatin  40 mg Oral Daily  . cefTRIAXone (ROCEPHIN)  IV  2 g Intravenous Q24H  . ferrous sulfate  325 mg Oral BID WC  . hydrALAZINE  25 mg Oral TID  . insulin aspart  0-15 Units Subcutaneous TID WC  . insulin aspart  0-5 Units Subcutaneous QHS  . insulin glargine  17 Units Subcutaneous QHS  . levothyroxine  25 mcg Oral QAC breakfast  . pregabalin  100 mg Oral Daily   And  . pregabalin  150 mg Oral BID  . Warfarin - Pharmacist Dosing Inpatient   Does not apply q1800   Infusions:  . sodium chloride 125 mL/hr at 12/27/15 0157    Assessment: 70yo male admitted for sepsis to continue Coumadin for h/o CVA; last dose was taken 9/11.  Goal of Therapy:  INR 2-3   Plan:  Will obtain current INR prior to ordering doses.  Vernard GamblesVeronda Kameah Rawl, PharmD, BCPS  12/27/2015,3:40 AM

## 2015-12-27 NOTE — Progress Notes (Signed)
PROGRESS NOTE    Ronald Simpson  ION:[id number] DOB: [birth year] DOA: 12/26/2015 PCP: No PCP Per Patient   Brief Narrative: Ronald Simpson is a 70 y.o. with a history of CAD status post CABG in 2016, history of CVA, diabetes mellitus type 2, hepatitis C, chronic anticoagulation for unknown reasons. He presented with symptoms of malaise for 4 days. Admission, he met sepsis criteria with possible source of urine. C. difficile so was ruled out. GI panel pending   Assessment & Plan:   Principal Problem:   Sepsis ([place]) Active Problems:   Hepatitis C without hepatic coma   Coronary artery disease due to lipid rich plaque   Type 2 diabetes mellitus with diabetic peripheral angiopathy without gangrene, without long-term current use of insulin (HCC)   History of CVA (cerebrovascular accident)   Genital lesion, male   AKI (acute kidney injury) ([place])   Hypothyroidism, acquired   Essential hypertension   Sepsis possibly secondary to urinary tract infection Vital signs improved. He status post vancomycin and Zosyn in the emergency department. He was mildly febrile overnight. -Continue ceftriaxone -Follow-up blood and urine cultures -Continue IV fluids  AKI -Follow-up when necessary electrolytes -Continue to hold diuretics and ACEi -Continue fluids -Repeat BMP in the morning  CAD History of CVA Hypertension -Continue hydralazine -Continue to hold Lasix, lactone, lisinopril secondary to AKI -Restart Coreg -Continue to hold home amlodipine, will watch blood pressure -Continue warfarin for presumed stroke prevention  Insulin-dependent diabetes mellitus -Continue Lantus -Continue sliding-scale insulin -Continue Lyrica and amitriptyline for neuropathy  Hypothyroidism -Continue levothyroxine  History of hepatitis C No history of cirrhosis. AST and alkaline phosphatase slightly elevated  Dry gangrene -Follow-up ABI  Penis ulcer   DVT prophylaxis: Warfarin  Code Status:  Full code  Family Communication: None at bedside  Disposition Plan: Likely discharge home in 1-2 days   Consultants:   Physical therapy  Procedures:  None  Antimicrobials:  Vancomycin IV (9/13)  Zosyn (9/13)  Ceftriaxone (9/14>>   Subjective: Patient reports no problems overnight. He has had a low-grade fever.  Objective: Vitals:   12/27/15 0015 12/27/15 0136 12/27/15 0146 12/27/15 0541  BP: 137/88  (!) 145/75 (!) 142/82  Pulse: 84  81 98  Resp:   18 18  Temp:   100.1 F (37.8 C) 100.2 F (37.9 C)  TempSrc:   Oral   SpO2: 100%  100% 96%  Weight:  72.4 kg (159 lb 11.2 oz)    Height:  '5\' 10"'$  (1.778 m)      Intake/Output Summary (Last 24 hours) at 12/27/15 1258 Last data filed at 12/27/15 1008  Gross per 24 hour  Intake          2447.08 ml  Output              350 ml  Net          2097.08 ml   Filed Weights   12/27/15 0136  Weight: 72.4 kg (159 lb 11.2 oz)    Examination:  General exam: Appears calm and comfortable Respiratory system: Clear to auscultation. Respiratory effort normal. Cardiovascular system: S1 & S2 heard, RRR. No murmurs, rubs, gallops or clicks. Gastrointestinal system: Abdomen is nondistended, soft and nontender. Normal bowel sounds heard. Central nervous system: Alert and oriented. No focal neurological deficits. Extremities: No edema. No calf tenderness Skin: No cyanosis. Dry gangrene on the left second and third toes Psychiatry: Judgement and insight appear poor. Mood & affect slightly flat. Patient answers questions appropriately but loses  attention    Data Reviewed: I have personally reviewed following labs and imaging studies  CBC:  Recent Labs Lab 12/26/15 1908 12/27/15 0631  WBC 26.1* 24.5*  NEUTROABS 23.2*  --   HGB 12.2* 11.4*  HCT 36.4* 34.7*  MCV 89.4 90.8  PLT 337 354   Basic Metabolic Panel:  Recent Labs Lab 12/26/15 1908 12/27/15 0631  NA 134* 133*  K 4.9 4.3  CL 104 104  CO2 20* 21*  GLUCOSE 452*  331*  BUN 55* 50*  CREATININE 2.04* 1.66*  CALCIUM 9.2 8.8*   GFR: Estimated Creatinine Clearance: 43.6 mL/min (by C-G formula based on SCr of 1.66 mg/dL (H)). Liver Function Tests:  Recent Labs Lab 12/26/15 1908 12/27/15 0631  AST 29 51*  ALT 21 26  ALKPHOS 143* 139*  BILITOT 1.0 1.1  PROT 8.1 8.0  ALBUMIN 2.2* 2.0*   No results for input(s): LIPASE, AMYLASE in the last 168 hours. No results for input(s): AMMONIA in the last 168 hours. Coagulation Profile:  Recent Labs Lab 12/27/15 0631  INR 4.82*   Cardiac Enzymes: No results for input(s): CKTOTAL, CKMB, CKMBINDEX, TROPONINI in the last 168 hours. BNP (last 3 results) No results for input(s): PROBNP in the last 8760 hours. HbA1C: No results for input(s): HGBA1C in the last 72 hours. CBG:  Recent Labs Lab 12/27/15 0204 12/27/15 0811 12/27/15 1141  GLUCAP 390* 334* 153*   Lipid Profile: No results for input(s): CHOL, HDL, LDLCALC, TRIG, CHOLHDL, LDLDIRECT in the last 72 hours. Thyroid Function Tests: No results for input(s): TSH, T4TOTAL, FREET4, T3FREE, THYROIDAB in the last 72 hours. Anemia Panel: No results for input(s): VITAMINB12, FOLATE, FERRITIN, TIBC, IRON, RETICCTPCT in the last 72 hours. Sepsis Labs:  Recent Labs Lab 12/26/15 1929  LATICACIDVEN 1.54    Recent Results (from the past 240 hour(s))  C difficile quick scan w PCR reflex     Status: None   Collection Time: 12/27/15  1:05 AM  Result Value Ref Range Status   C Diff antigen NEGATIVE NEGATIVE Final   C Diff toxin NEGATIVE NEGATIVE Final   C Diff interpretation No C. difficile detected.  Final         Radiology Studies: Dg Chest 2 View  Result Date: 12/26/2015 CLINICAL DATA:  Chest pain, weakness, possible sepsis. EXAM: CHEST  2 VIEW COMPARISON:  None. FINDINGS: Patient is post median sternotomy with prosthetic valve. Lung volumes are low. Mediastinal contours are normal for low volume AP technique. No consolidation. No  pulmonary edema, pleural effusion or pneumothorax. No acute osseous abnormalities seen. IMPRESSION: No active cardiopulmonary disease. Electronically Signed   By: Rubye Oaks M.D.   On: 12/26/2015 20:42   Dg Knee 2 Views Right  Result Date: 12/26/2015 CLINICAL DATA:  Stump infection. EXAM: RIGHT KNEE - 1-2 VIEW COMPARISON:  Radiographs 6 days prior. FINDINGS: Post below-the-knee amputation with unchanged soft tissue edema of the stump soft tissues. Resection margins remain smooth without bony destructive change. No periosteal reaction. There are dense vascular calcifications, unchanged from prior. Presumed metallic foreign bodies again seen. No tracking soft tissue air. Age medullary rod in the distal femur is partially included. Development of small knee joint effusion. Mild degenerative change about the knee is again seen. IMPRESSION: Post right below-the-knee amputation with soft tissue edema of the distal stump. No tracking soft tissue air. No radiographic findings of osteomyelitis. Electronically Signed   By: Rubye Oaks M.D.   On: 12/26/2015 22:06   Dg Foot Complete  Left  Result Date: 12/26/2015 CLINICAL DATA:  Stump infection.  Peripheral vascular disease. EXAM: LEFT FOOT - COMPLETE 3+ VIEW COMPARISON:  12/20/2015 FINDINGS: Negative for acute fracture or dislocation. Multiple metallic foreign bodies are again evident about the foot and ankle. Moderate degenerative changes are present at the first MTP. No bony destruction. No soft tissue gas. Extensive vascular calcifications. IMPRESSION: No bony destruction to confirm osteomyelitis.  No soft tissue gas. Electronically Signed   By: Andreas Newport M.D.   On: 12/26/2015 22:04   Ct Renal Stone Study  Result Date: 12/27/2015 CLINICAL DATA:  Sepsis.  Abdominal pain and diarrhea. EXAM: CT ABDOMEN AND PELVIS WITHOUT CONTRAST TECHNIQUE: Multidetector CT imaging of the abdomen and pelvis was performed following the standard protocol without IV  contrast. COMPARISON:  None. FINDINGS: Lower chest: Linear atelectasis. No pleural fluid. No focal opacity to suggest pneumonia. Scarring in the periphery of the left lung base. Coronary artery calcifications. 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 Hepatobiliary: Equivocal nodularity of hepatic contours, raising suspicious for cirrhosis. No focal lesion allowing for lack contrast. Probable gallstone within minimally distended gallbladder, no pericholecystic inflammation. No biliary dilatation. Pancreas: Mild motion artifact. No ductal dilatation or inflammation. Spleen: Normal in size without focal abnormality. Adrenals/Urinary Tract: Thickening of the adrenal glands without discrete nodule. Mild bilateral hydroureteronephrosis and bladder is distended. Hydronephrosis likely secondary to bladder distention. No urolithiasis. Cortical scarring in the mid lower left kidney. Mild nonspecific perinephric edema is symmetric. Stomach/Bowel: Small hiatal hernia. Stomach physiologically distended. No small bowel dilatation, obstruction, or inflammation. Surgical clips in the mesentery of the left upper quadrant. Colon is nondistended, difficult to exclude mild wall thickening involving the ascending, descending and sigmoid colon in the setting of diarrhea. No pericolonic soft tissue stranding. A few distal colonic diverticula, no diverticulitis. Vascular/Lymphatic: Small perigastric and prominent periportal nodes. There is enlarged right inguinal node measuring 1.4 cm short axis. No retroperitoneal adenopathy. Abdominal and branch atherosclerosis without aneurysm. Reproductive: Prostate gland normal in size. Other: No ascites or free air. Musculoskeletal: There are no acute or suspicious osseous abnormalities. Intra medullary rod noted in both proximal femur, partially included. IMPRESSION: 1. Colon is decompressed, difficult to exclude mild wall thickening involving the ascending, descending and sigmoid colon in the setting of  diarrhea. No pericolonic inflammation. 2. Urinary bladder distention with subsequent mild bilateral hydronephrosis. No urolithiasis. 3. Question of cirrhotic hepatic morphology. Single gallstone without gallbladder inflammation. Periportal and small perigastric nodes are likely reactive but nonspecific. 4. Aorta and branch atherosclerosis, no aneurysm. R 5 colonic diverticulosis without diverticulitis. 5. Enlarged right inguinal node is nonspecific, suspect this is reactive. Electronically Signed   By: Jeb Levering M.D.   On: 12/27/2015 01:44        Scheduled Meds: . amitriptyline  25 mg Oral QHS  . atorvastatin  40 mg Oral Daily  . cefTRIAXone (ROCEPHIN)  IV  2 g Intravenous Q24H  . ferrous sulfate  325 mg Oral BID WC  . hydrALAZINE  25 mg Oral TID  . insulin aspart  0-15 Units Subcutaneous TID WC  . insulin aspart  0-5 Units Subcutaneous QHS  . insulin glargine  17 Units Subcutaneous QHS  . levothyroxine  25 mcg Oral QAC breakfast  . pregabalin  100 mg Oral Daily   And  . pregabalin  150 mg Oral BID  . Warfarin - Pharmacist Dosing Inpatient   Does not apply q1800   Continuous Infusions: . sodium chloride 1,000 mL (12/27/15 1007)  LOS: 1 day     Cordelia Poche [facility] 12/27/2015, 12:58 PM Pager: [phone]  If 7PM-7AM, please contact night-coverage [email or web address] Password [facility] 12/27/2015, 12:58 PM

## 2015-12-27 NOTE — ED Notes (Signed)
Patient transported to CT via stretcher. Patient to be transported from CT to [address].

## 2015-12-27 NOTE — Progress Notes (Signed)
Received report from ED RN, Kendal HymenBonnie.

## 2015-12-27 NOTE — Progress Notes (Signed)
CRITICAL VALUE ALERT  Critical value received:  INR=4.82  Date of notification:  12/27/2015  Time of notification:  0810  Critical value read back:Yes.    Nurse who received alert:  Lurena NidaGreta Neve Branscomb  MD notified (1st page):  Dr. Caleb PoppNettey  Time of first page:  0811  MD notified (2nd page):  Time of second page:  Responding MD:  Dr. Caleb PoppNettey  Time MD responded:  [id number]

## 2015-12-27 NOTE — Care Management Note (Addendum)
Case Management Note  Patient Details  Name: Ronald Simpson MRN: [id number] Date of Birth: 1945-11-04  Subjective/Objective:                Patient admitted from home. Patient could not clearly answer questions related to home health and needs at discharge. Will attempt to revisit patient tomorrow. Addendum 12-28-15, PCP added to profile, [place] Patient states he has a prosthetic and a WC at home, he states he lives with his mother and [place]. He states he has had HH in the past and he "sent them off" he could not identify any DME he needed in addition to what he has. He gave permission to call his mother and ask if anything else was needed in the home, CM LM with [employer].  Addendum- Spoke with family member she stated [facility]; never called back.     Action/Plan:  01-10-16 Patient with [facility] pta for HHA, PT, OT, RN. Started 12/11/15.   Expected Discharge Date:                  Expected Discharge Plan:  Home w Home Health Services  In-House Referral:     Discharge planning Services  CM Consult  Post Acute Care Choice:    Choice offered to:     DME Arranged:    DME Agency:     HH Arranged:    HH Agency:     Status of Service:  In process, will continue to follow  If discussed at Long Length of Stay Meetings, dates discussed:    Additional Comments:  Lawerance SabalDebbie Milledge Gerding, RN 12/27/2015, 11:47 AM

## 2015-12-27 NOTE — H&P (Signed)
History and Physical  Patient Name: Ronald Simpson     ZOX:[id number]    DOB: [birth year]    DOA: 12/26/2015 PCP: No PCP Per Patient   Patient coming from: Home  Chief Complaint: Malaise  HPI: [name] is a 70 y.o. male with a past medical history significant for CAD status post CABG in 2015, history of CVA, DM 2, hep C, and on chronic anticoagulation for unknown reasons who presents with malaise for four days.  Caveat that all history is collected from patient's daughter at the bedside as well as CareEverywhere, as patient appears disoriented.    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  He was briefly in rehab, but discharged about a week to two weeks ago.  Last Thursday, he was seen in the [facility] ER for pain in his stump (after falling on the stump) and pain in his left toes as well as color change/blackening of the toes.  The patient's daughter and mother state that he was asked to stay in the hospital but refused, although the chart in [facility] states that the case was discussed with his surgeon Dr. Ivor Costa who plan to inform the patient's wound clinic in [facility] and coordinate him with vascular surgery.  Per the chart, the patient was discharged with several days of oxycodone for pain. Over the weekend the patient's mother notes that he was "just lying around", seemed tired, wouldn't eat anything, complained of vague abdominal discomfort and malaise, and just "wasn't himself". Today they called EMS, who found the patient with foul-smelling urine in the commode, and brought him to the ER.  ED course: -Febrile to 102F, heart rate 100s, respirations normal, blood pressure and pulse oximetry normal -Na 134, K 4.9, Cr 2.04 (baseline 0.9), WBC 26.1K, Hgb 12.2 -Lactic acid normal -Urinalysis with leukocytosis, WBC TNTC -CXR clear -Radiographs of the right knee and left foot showed mild edema, no osteomyelitis changes -He had several foul-smelling  watery stools in the ER -He was given empiric vancomycin and Zosyn and TRH were asked to evaluate for admission for sepsis, unknown source        ROS: Review of Systems  Constitutional: Positive for malaise/fatigue.  Gastrointestinal: Positive for nausea (decreased appetite).  Musculoskeletal: Positive for joint pain (discoloration of toes in left foot and foot pain).  Neurological: Positive for weakness (sluggish, inattentive).  Psychiatric/Behavioral: Positive for depression.  All other systems reviewed and are negative.         Past Medical History:  Diagnosis Date  . CAD (coronary artery disease)   . CVA (cerebral infarction)   . Diabetes mellitus without complication (HCC)   . History of hepatitis B   . History of hepatitis C   . Peripheral vascular disease [facility])     Past Surgical History:  Procedure Laterality Date  . BELOW KNEE LEG AMPUTATION Right   . CORONARY ARTERY BYPASS GRAFT  2005  . STERNOTOMY      Social History: Patient lives with his mother currently.  The patient is supposed to be able to walk with a walker right now.  He smokes. Has previous history of alcohol abuse.    No Known Allergies  Family history: family history includes Diabetes in his other; Hypertension in his other; Lupus in his other; Mental illness in his other.  Prior to Admission medications   Medication Sig Start Date End Date Taking? Authorizing Provider  amitriptyline (ELAVIL) 25 MG tablet Take 25 mg by mouth at  bedtime.   Yes Historical Provider, MD  amLODipine (NORVASC) 10 MG tablet Take 10 mg by mouth daily.   Yes Historical Provider, MD  atorvastatin (LIPITOR) 40 MG tablet Take 40 mg by mouth daily.   Yes Historical Provider, MD  carvedilol (COREG) 6.25 MG tablet Take 6.25 mg by mouth 2 (two) times daily with a meal.   Yes Historical Provider, MD  ferrous sulfate 325 (65 FE) MG tablet Take 325 mg by mouth 2 (two) times daily with a meal.   Yes Historical Provider, MD    furosemide (LASIX) 20 MG tablet Take 20 mg by mouth daily.   Yes Historical Provider, MD  hydrALAZINE (APRESOLINE) 25 MG tablet Take 25 mg by mouth 3 (three) times daily.   Yes Historical Provider, MD  insulin glargine (LANTUS) 100 unit/mL SOPN Inject 17 Units into the skin at bedtime.   Yes Historical Provider, MD  levothyroxine (SYNTHROID, LEVOTHROID) 25 MCG tablet Take 25 mcg by mouth daily before breakfast.   Yes Historical Provider, MD  lisinopril (PRINIVIL,ZESTRIL) 10 MG tablet Take 10 mg by mouth daily.   Yes Historical Provider, MD  nitroGLYCERIN (NITROSTAT) 0.4 MG SL tablet Place 0.4 mg under the tongue every 5 (five) minutes as needed for chest pain.   Yes Historical Provider, MD  oxyCODONE (OXY IR/ROXICODONE) 5 MG immediate release tablet Take 5 mg by mouth every 4 (four) hours as needed for severe pain.   Yes Historical Provider, MD  pregabalin (LYRICA) 50 MG capsule Take 100-150 mg by mouth 3 (three) times daily. Take 10mg  every morning and 50mg  at 1400 and bedtime   Yes Historical Provider, MD  spironolactone (ALDACTONE) 25 MG tablet Take 25 mg by mouth daily.   Yes Historical Provider, MD  warfarin (COUMADIN) 3 MG tablet Take 7 mg by mouth daily. Takes along with a 4mg  tablet   Yes Historical Provider, MD       Physical Exam: BP 137/88   Pulse 84   Temp 99.9 F (37.7 C) (Oral)   Resp 17   SpO2 100%  General appearance: Well-developed, adult male, awake but not oriented to year or situation, somewhat inattentive, able to state he is in "[place]".  No acute distress.   Eyes: Anicteric, conjunctiva pink, lids and lashes normal. PERRL.    ENT: No nasal deformity, discharge, epistaxis.  Hearing normal. OP moist without lesions.  Edentulous. Neck: No neck masses.  Trachea midline.  No thyromegaly/tenderness. Lymph: No cervical or supraclavicular lymphadenopathy. Skin: Warm and dry.  The right leg stump appears normal, although it is tender.  The incisions are clean and dry  without redness or swelling or firmness or dehiscence.  There is tenderness to palpation of the leg.  The left 2nd and third toes have distal dry gangrene. Cardiac: Tachycardic, regular, nl S1-S2, no murmurs appreciated.  Capillary refill is brisk.  JVP normal.  No LE edema.  Radial pulses 2+ and symmetric.  LEFT DP pulse diminished. Respiratory: Normal respiratory rate and rhythm.  CTAB without rales or wheezes. Abdomen: Abdomen soft.  Moderate TTP in RLQ, guarding, no rebound or rigidity. No ascites, distension, hepatosplenomegaly.   MSK: No deformities or effusions.  No cyanosis or clubbing. Neuro: Cranial nerves grossly intact.  Sensation intact to light touch seems itnact. Speech is fluent.  Muscle strength globally diminished, no focal deficits.    Psych: Sensorium intact and responding to questions, attention diminished.  Judgment and insight appear poor.     Labs on Admission:  I  have personally reviewed following labs and imaging studies: CBC:  Recent Labs Lab 12/26/15 1908  WBC 26.1*  NEUTROABS 23.2*  HGB 12.2*  HCT 36.4*  MCV 89.4  PLT 337   Basic Metabolic Panel:  Recent Labs Lab 12/26/15 1908  NA 134*  K 4.9  CL 104  CO2 20*  GLUCOSE 452*  BUN 55*  CREATININE 2.04*  CALCIUM 9.2   GFR: CrCl cannot be calculated (Unknown ideal weight.).  Liver Function Tests:  Recent Labs Lab 12/26/15 1908  AST 29  ALT 21  ALKPHOS 143*  BILITOT 1.0  PROT 8.1  ALBUMIN 2.2*   Sepsis Labs: Lactic acid 1.5       Radiological Exams on Admission: Personally reviewed: Dg Chest 2 View  Result Date: 12/26/2015 CLINICAL DATA:  Chest pain, weakness, possible sepsis. EXAM: CHEST  2 VIEW COMPARISON:  None. FINDINGS: Patient is post median sternotomy with prosthetic valve. Lung volumes are low. Mediastinal contours are normal for low volume AP technique. No consolidation. No pulmonary edema, pleural effusion or pneumothorax. No acute osseous abnormalities seen. IMPRESSION:  No active cardiopulmonary disease. Electronically Signed   By: Rubye OaksMelanie  Ehinger M.D.   On: 12/26/2015 20:42   Dg Knee 2 Views Right  Result Date: 12/26/2015 CLINICAL DATA:  Stump infection. EXAM: RIGHT KNEE - 1-2 VIEW COMPARISON:  Radiographs 6 days prior. FINDINGS: Post below-the-knee amputation with unchanged soft tissue edema of the stump soft tissues. Resection margins remain smooth without bony destructive change. No periosteal reaction. There are dense vascular calcifications, unchanged from prior. Presumed metallic foreign bodies again seen. No tracking soft tissue air. Age medullary rod in the distal femur is partially included. Development of small knee joint effusion. Mild degenerative change about the knee is again seen. IMPRESSION: Post right below-the-knee amputation with soft tissue edema of the distal stump. No tracking soft tissue air. No radiographic findings of osteomyelitis. Electronically Signed   By: Rubye OaksMelanie  Ehinger M.D.   On: 12/26/2015 22:06   Dg Foot Complete Left  Result Date: 12/26/2015 CLINICAL DATA:  Stump infection.  Peripheral vascular disease. EXAM: LEFT FOOT - COMPLETE 3+ VIEW COMPARISON:  12/20/2015 FINDINGS: Negative for acute fracture or dislocation. Multiple metallic foreign bodies are again evident about the foot and ankle. Moderate degenerative changes are present at the first MTP. No bony destruction. No soft tissue gas. Extensive vascular calcifications. IMPRESSION: No bony destruction to confirm osteomyelitis.  No soft tissue gas. Electronically Signed   By: Ellery Plunkaniel R Mitchell M.D.   On: 12/26/2015 22:04     Assessment/Plan 1. Sepsis:  Suspected source urine vs C diff, doubt diabetic foot infection or cellulitis. Organism unknown. Patient meets criteria given tachycardia, fever, leukocytosis, and evidence of organ dysfunction (AKI).  Lactate normal. This patient is/is not at high risk of poor outcomes with a SOFA score of 1 (at least 2 of the following clinical  criteria: respiratory rate of 22/min or greater, altered mentation, or systolic blood pressure of 100 mm Hg or less).  Antibiotics delivered in the ED.    -Sepsis bundle utilized:  -Blood and urine cultures drawn  -Narrow antibiotics to ceftriaxone and oral vancomycin, based on suspected source of infection    -Check Cdiff and stop oral vancomycin if normal  -Repeat renal function and complete blood count in AM  -Code SEPSIS called to E-link        2. AKI:  -Urine electrolytes -Hold diuretics -Hold ACEi -Fluids and trend BMP  3. CAD, and hx of CVA and  HTN:  -Continue hydralazine -Hold Lasix, spironolactone, lisinopril given AKI -Hold carvedilol until hemodynamics clearer -Continue warfarin, dosed per pharmacy, presumably for stroke prevention -Obtain records from [facility]  4. IDDM:  -Continue glargine -SSI -Continue Lyrica and amitriptyline for neuropathy  5. Hypothyroidism:  -Continue levothyroxine  6. History of Hep C:  No known cirrhosis.  7. Dry gangrene:  -Obtain ABI -If worsening, will consult vascular surgery while inpatient  8. Penis ulcer: Unclear etiology.  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  9. Other medications: -Continue iron      DVT prophylaxis: Not necessary, on warfarin Code Status: FULL  Family Communication: Daughter at bedside, mother by phone.  Disposition Plan: Anticipate IV fluids and antibiotics.  Follow urine culture.  If foot pain worsens, consult to vascular.   Consults called: None Admission status: INPATIENT, med surg      Medical decision making: Patient seen at 12:32 AM on 12/27/2015.  The patient was discussed with Dr. Ruthell Rummage.  What exists of the patient's chart was reviewed in depth and outside records in [facility] and summarized above, outside records requested.  Clinical condition: tachcyardic and disoriented, but alert and BP normal and respirations normal, improving with fluids and stable for medical surgical  floor.        Alberteen Sam [facility] Pager [phone]    At the time of admission, it appears that the appropriate admission status for this patient is INPATIENT. This is judged to be reasonable and necessary in order to provide the required intensity of service to ensure the patient's safety given the presenting symptoms, physical exam findings, and initial radiographic and laboratory data in the context of their chronic comorbidities.  Together, these circumstances are felt to place her/him at high risk for further clinical deterioration threatening life, limb, or organ. The following factors support the admission status of inpatient:   A. The patient's presenting symptoms include malaise, sluggishness, decreased appetite, stump pain, foul urine B. The worrisome physical exam findings include tachycardia, fever, confusion C. The initial radiographic and laboratory data are worrisome because of leukocytosis, hyperglycemia, acute renal failure, low albumin, pyuria. D. The chronic co-morbidities include hepatitis C, hepatitis B, CAD s/p CABG, history of stroke, insulin dependent diabetes E. Patient requires inpatient status due to high intensity of service, high risk for further deterioration and high frequency of surveillance required because of this acute illness that poses a threat to life or bodily function. F. I certify that at the point of admission it is my clinical judgment that the patient will require inpatient hospital care spanning beyond 2 midnights from the point of admission.

## 2015-12-27 NOTE — ED Notes (Signed)
Attempted to call report to [address] x 1.  

## 2015-12-27 NOTE — Progress Notes (Signed)
Pharmacy Antibiotic Note  Ronald Simpson is a 70 y.o. male admitted on 12/26/2015 with sepsis.  Pharmacy has been consulted for Vancocin and Zosyn dosing.  Plan: Vancomycin 1000mg  IV every 24 hours.  Goal trough 15-20 mcg/mL. Zosyn 3.375g IV q8h (4 hour infusion).  Height: 5\' 10"  (177.8 cm) Weight: 159 lb 11.2 oz (72.4 kg) IBW/kg (Calculated) : 73  Temp (24hrs), Avg:99.9 F (37.7 C), Min:99.9 F (37.7 C), Max:99.9 F (37.7 C)   Recent Labs Lab 12/26/15 1908 12/26/15 1929  WBC 26.1*  --   CREATININE 2.04*  --   LATICACIDVEN  --  1.54    Estimated Creatinine Clearance: 35.5 mL/min (by C-G formula based on SCr of 2.04 mg/dL (H)).    No Known Allergies   Thank you for allowing pharmacy to be a part of this patient's care.  Ronald Simpson, PharmD, BCPS  12/27/2015 1:44 AM

## 2015-12-27 NOTE — Progress Notes (Signed)
Inpatient Diabetes Program Recommendations  AACE/ADA: New Consensus Statement on Inpatient Glycemic Control (2015)  Target Ranges:  Prepandial:   less than 140 mg/dL      Peak postprandial:   less than 180 mg/dL (1-2 hours)      Critically ill patients:  140 - 180 mg/dL   Lab Results  Component Value Date   GLUCAP 153 (H) 12/27/2015    Review of Glycemic Control  Results for [name], Ronald (MRN [id number]) as of 12/27/2015 13:11  Ref. Range 12/27/2015 02:04 12/27/2015 08:11 12/27/2015 11:41  Glucose-Capillary Latest Ref Range: 65 - 99 mg/dL 132390 (H) 440334 (H) 102153 (H)    Diabetes history: Type 2, A1C ordered Outpatient Diabetes medications: Lantus 17 units qday  Current orders for Inpatient glycemic control: Lantus 17 units qday, Novolog 0-15 units tid, Novolog 0-5units tid  Inpatient Diabetes Program Recommendations: A1C ordered.  Fasting blood sugar elevated this morning but Lantus was not given until 0224am when the patient was admitted.  Novolog correction insulin bring CBG down effectively.  Diet ordered.  Will follow for additional needs.   Susette RacerJulie Maston Wight, RN, BA, MHA, CDE Diabetes Coordinator Inpatient Diabetes Program  [phone] (Team Pager) [phone] [facility] Office) 12/27/2015 1:18 PM

## 2015-12-27 NOTE — Progress Notes (Signed)
ANTICOAGULATION CONSULT NOTE - Initial Consult  Pharmacy Consult for Coumadin Indication: stroke  No Known Allergies  Patient Measurements: Height: 5\' 10"  (177.8 cm) Weight: 159 lb 11.2 oz (72.4 kg) IBW/kg (Calculated) : 73  Vital Signs: Temp: 100.2 F (37.9 C) (09/14 0541) Temp Source: Oral (09/14 0146) BP: 142/82 (09/14 0541) Pulse Rate: 98 (09/14 0541)  Labs:  Recent Labs  12/26/15 1908 12/27/15 0631  HGB 12.2* 11.4*  HCT 36.4* 34.7*  PLT 337 354  LABPROT  --  46.5*  INR  --  4.82*  CREATININE 2.04* 1.66*    Estimated Creatinine Clearance: 43.6 mL/min (by C-G formula based on SCr of 1.66 mg/dL (H)).   Medical History: Past Medical History:  Diagnosis Date  . CAD (coronary artery disease)   . CVA (cerebral infarction)   . Diabetes mellitus without complication (HCC)   . History of hepatitis B   . History of hepatitis C   . Peripheral vascular disease (HCC)     Medications:  Prescriptions Prior to Admission  Medication Sig Dispense Refill Last Dose  . amitriptyline (ELAVIL) 25 MG tablet Take 25 mg by mouth at bedtime.   Past Week at Unknown time  . amLODipine (NORVASC) 10 MG tablet Take 10 mg by mouth daily.   12/25/2015 at Unknown time  . atorvastatin (LIPITOR) 40 MG tablet Take 40 mg by mouth daily.   Past Week at Unknown time  . carvedilol (COREG) 6.25 MG tablet Take 6.25 mg by mouth 2 (two) times daily with a meal.   12/25/2015 at 0730  . ferrous sulfate 325 (65 FE) MG tablet Take 325 mg by mouth 2 (two) times daily with a meal.   12/25/2015 at Unknown time  . furosemide (LASIX) 20 MG tablet Take 20 mg by mouth daily.   Past Week at Unknown time  . hydrALAZINE (APRESOLINE) 25 MG tablet Take 25 mg by mouth 3 (three) times daily.   12/25/2015 at Unknown time  . insulin glargine (LANTUS) 100 unit/mL SOPN Inject 17 Units into the skin at bedtime.   12/24/2015  . levothyroxine (SYNTHROID, LEVOTHROID) 25 MCG tablet Take 25 mcg by mouth daily before breakfast.    12/25/2015 at Unknown time  . lisinopril (PRINIVIL,ZESTRIL) 10 MG tablet Take 10 mg by mouth daily.   12/25/2015 at Unknown time  . nitroGLYCERIN (NITROSTAT) 0.4 MG SL tablet Place 0.4 mg under the tongue every 5 (five) minutes as needed for chest pain.   never  . oxyCODONE (OXY IR/ROXICODONE) 5 MG immediate release tablet Take 5 mg by mouth every 4 (four) hours as needed for severe pain.   Past Month at Unknown time  . pregabalin (LYRICA) 50 MG capsule Take 100-150 mg by mouth 3 (three) times daily. Take 100mg  every morning and 150mg  at 1400 and bedtime   12/25/2015 at Unknown time  . spironolactone (ALDACTONE) 25 MG tablet Take 25 mg by mouth daily.   Past Week at Unknown time  . warfarin (COUMADIN) 3 MG tablet Take 7 mg by mouth daily. Takes along with a 4mg  tablet   12/24/2015   Scheduled:  . amitriptyline  25 mg Oral QHS  . atorvastatin  40 mg Oral Daily  . cefTRIAXone (ROCEPHIN)  IV  2 g Intravenous Q24H  . ferrous sulfate  325 mg Oral BID WC  . hydrALAZINE  25 mg Oral TID  . insulin aspart  0-15 Units Subcutaneous TID WC  . insulin aspart  0-5 Units Subcutaneous QHS  . insulin glargine  17 Units Subcutaneous QHS  . levothyroxine  25 mcg Oral QAC breakfast  . pregabalin  100 mg Oral Daily   And  . pregabalin  150 mg Oral BID  . Warfarin - Pharmacist Dosing Inpatient   Does not apply q1800   Infusions:  . sodium chloride 1,000 mL (12/27/15 1007)    Assessment: 70yo male admitted for sepsis to continue Coumadin for h/o CVA; last dose was taken 9/11. INR 4.82, supratherapeutic this morning. hgb 11.4, pltc 354 K  Goal of Therapy:  INR 2-3   Plan:  No coumadin today F/u plans Daily INR  Bayard HuggerMei Aiden Helzer, PharmD, BCPS  Clinical Pharmacist  Pager: [phone]   12/27/2015,10:45 AM

## 2015-12-28 ENCOUNTER — Encounter (HOSPITAL_COMMUNITY): Payer: Medicare Other

## 2015-12-28 DIAGNOSIS — A040 Enteropathogenic Escherichia coli infection: Secondary | ICD-10-CM

## 2015-12-28 LAB — GASTROINTESTINAL PANEL BY PCR, STOOL (REPLACES STOOL CULTURE)
ASTROVIRUS: NOT DETECTED
Adenovirus F40/41: NOT DETECTED
CYCLOSPORA CAYETANENSIS: NOT DETECTED
Campylobacter species: NOT DETECTED
Cryptosporidium: NOT DETECTED
ENTAMOEBA HISTOLYTICA: NOT DETECTED
ENTEROAGGREGATIVE E COLI (EAEC): NOT DETECTED
ENTEROTOXIGENIC E COLI (ETEC): NOT DETECTED
Enteropathogenic E coli (EPEC): DETECTED — AB
GIARDIA LAMBLIA: NOT DETECTED
NOROVIRUS GI/GII: NOT DETECTED
Plesimonas shigelloides: NOT DETECTED
Rotavirus A: NOT DETECTED
SHIGA LIKE TOXIN PRODUCING E COLI (STEC): NOT DETECTED
Salmonella species: NOT DETECTED
Sapovirus (I, II, IV, and V): NOT DETECTED
Shigella/Enteroinvasive E coli (EIEC): NOT DETECTED
VIBRIO CHOLERAE: NOT DETECTED
VIBRIO SPECIES: NOT DETECTED
Yersinia enterocolitica: NOT DETECTED

## 2015-12-28 LAB — GLUCOSE, CAPILLARY
GLUCOSE-CAPILLARY: 171 mg/dL — AB (ref 65–99)
Glucose-Capillary: 212 mg/dL — ABNORMAL HIGH (ref 65–99)
Glucose-Capillary: 205 mg/dL — ABNORMAL HIGH (ref 65–99)
Glucose-Capillary: 180 mg/dL — ABNORMAL HIGH (ref 65–99)

## 2015-12-28 LAB — URINE CULTURE: Culture: 30000 — AB

## 2015-12-28 LAB — HEMOGLOBIN A1C
HEMOGLOBIN A1C: 9.1 % — AB (ref 4.8–5.6)
MEAN PLASMA GLUCOSE: 214 mg/dL

## 2015-12-28 LAB — CBC
HCT: 30.9 % — ABNORMAL LOW (ref 39.0–52.0)
HEMOGLOBIN: 10 g/dL — AB (ref 13.0–17.0)
MCH: 29.3 pg (ref 26.0–34.0)
MCHC: 32.4 g/dL (ref 30.0–36.0)
MCV: 90.6 fL (ref 78.0–100.0)
Platelets: 321 10*3/uL (ref 150–400)
RBC: 3.41 MIL/uL — AB (ref 4.22–5.81)
RDW: 13.4 % (ref 11.5–15.5)
WBC: 18.3 10*3/uL — ABNORMAL HIGH (ref 4.0–10.5)

## 2015-12-28 LAB — PROTIME-INR
INR: 4.61
Prothrombin Time: 43 seconds — ABNORMAL HIGH (ref 11.4–15.2)

## 2015-12-28 LAB — BASIC METABOLIC PANEL
ANION GAP: 5 (ref 5–15)
BUN: 36 mg/dL — ABNORMAL HIGH (ref 6–20)
CALCIUM: 8.6 mg/dL — AB (ref 8.9–10.3)
CHLORIDE: 109 mmol/L (ref 101–111)
CO2: 20 mmol/L — AB (ref 22–32)
CREATININE: 1.13 mg/dL (ref 0.61–1.24)
GFR calc Af Amer: >60 mL/min (ref >60)
GFR calc non Af Amer: >60 mL/min (ref >60)
GLUCOSE: 149 mg/dL — AB (ref 65–99)
Potassium: 4.2 mmol/L (ref 3.5–5.1)
Sodium: 134 mmol/L — ABNORMAL LOW (ref 135–145)

## 2015-12-28 LAB — UREA NITROGEN, URINE: Urea Nitrogen, Ur: 689 mg/dL

## 2015-12-28 MED ORDER — WHITE PETROLATUM GEL
Status: AC
  Administered 2015-12-28: 10:00:00
  Filled 2015-12-28: qty 1

## 2015-12-28 NOTE — Progress Notes (Signed)
CRITICAL VALUE ALERT  Critical value received:  GI panel showed enteropathogenic ecoli  Date of notification:  12/28/15  Time of notification:  1405  Critical value read back:Yes.    Nurse who received alert:  Myan Suit   MD notified (1st page):  Dr. Caleb PoppNettey  Time of first page:  1408  MD notified (2nd page):  Time of second page:  Responding MD:    Time MD responded:

## 2015-12-28 NOTE — Progress Notes (Signed)
ANTICOAGULATION CONSULT NOTE - Initial Consult  Pharmacy Consult for Coumadin Indication: stroke  No Known Allergies  Patient Measurements: Height: 5\' 10"  (177.8 cm) Weight: 159 lb 11.2 oz (72.4 kg) IBW/kg (Calculated) : 73  Vital Signs: Temp: 99.2 F (37.3 C) (09/15 0611) Temp Source: Oral (09/15 0611) BP: 130/58 (09/15 0611) Pulse Rate: 75 (09/15 0611)  Labs:  Recent Labs  12/26/15 1908 12/27/15 0631 12/28/15 0848  HGB 12.2* 11.4* 10.0*  HCT 36.4* 34.7* 30.9*  PLT 337 354 321  LABPROT  --  46.5* 43.0*  INR  --  4.82* 4.61*  CREATININE 2.04* 1.66* 1.13    Estimated Creatinine Clearance: 64.1 mL/min (by C-G formula based on SCr of 1.13 mg/dL).   Assessment: 70yo male admitted for sepsis to continue Coumadin for h/o CVA; last dose was taken 9/11. INR 4.61, remains supratherapeutic this morning. No coumadin given since admission. hgb 10, pltc 321 K  Goal of Therapy:  INR 2-3   Plan:  No coumadin today F/u plans Daily INR  Bayard HuggerMei Shantina Chronister, PharmD, BCPS  Clinical Pharmacist  Pager: [phone]   12/28/2015,10:51 AM

## 2015-12-28 NOTE — Care Management Important Message (Signed)
Important Message  Patient Details  Name: [name] Staten MRN: [id number] Date of Birth: 09/26/1945   Medicare Important Message Given:  Yes    Lawerance Sabalebbie Coty Larsh, RN 12/28/2015, 11:45 AM
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
## 2015-12-28 NOTE — Progress Notes (Addendum)
CRITICAL VALUE ALERT  Critical value received:  PT 44.9 and INR 4.61  Date of notification:  12/28/15  Time of notification:  1048  Critical value read back:Yes.    Nurse who received alert:  Dresden Ament  MD notified (1st page):  Dr. Caleb PoppNettey   Time of first page:  1045  MD notified (2nd page):  Time of second page:  Responding MD:  Dr. Caleb PoppNettey  Time MD responded:  [phone]

## 2015-12-28 NOTE — Progress Notes (Signed)
PROGRESS NOTE    Ronald Simpson  BTD:[id number] DOB: April 12, 1946 DOA: 12/26/2015 PCP: Mayford Knife, MD   Brief Narrative: Ronald Simpson is a 70 y.o. with a history of CAD status post CABG in 2016, history of CVA, diabetes mellitus type 2, hepatitis C, chronic anticoagulation for unknown reasons. He presented with symptoms of malaise for 4 days. Admission, he met sepsis criteria with possible source of urine. C. difficile so was ruled out. GI panel pending   Assessment & Plan:   Principal Problem:   Sepsis ([place]) Active Problems:   Hepatitis C without hepatic coma   Coronary artery disease due to lipid rich plaque   Type 2 diabetes mellitus with diabetic peripheral angiopathy without gangrene, without long-term current use of insulin (HCC)   History of CVA (cerebrovascular accident)   Genital lesion, male   AKI (acute kidney injury) ([place])   Hypothyroidism, acquired   Essential hypertension   Sepsis possibly secondary to urinary tract infection Vital signs improved. He is status post vancomycin and Zosyn in the emergency department. He was mildly febrile overnight. Urine culture significant for candida albicans. GI panel significant for enteropathogenic E. Coli. Blood cultures negative x2 days. Initial presentation likely secondary to infectious gastroenteritis. Leukocytosis improving -Discontinue ceftriaxone -Continue supportive care; continue IV fluids  AKI -Follow-up when necessary electrolytes -Continue to hold diuretics and ACEi -Continue fluids -Repeat BMP in the morning  CAD History of CVA Hypertension INR elevated today. Management is per pharmacology -Continue hydralazine -Continue to hold Lasix, spironolactone, lisinopril secondary to AKI -Continue Coreg -Continue to hold home amlodipine, will watch blood pressure -Continue warfarin for presumed stroke prevention  Insulin-dependent diabetes mellitus -Continue Lantus -Continue sliding-scale  insulin -Continue Lyrica and amitriptyline for neuropathy  Hypothyroidism -Continue levothyroxine  History of hepatitis C No history of cirrhosis. AST and alkaline phosphatase slightly elevated  Dry gangrene X-ray significant for no osteo or gas -Follow-up ABI (pending)  Penis ulcer   DVT prophylaxis: Warfarin  Code Status: Full code  Family Communication: None at bedside  Disposition Plan: Plan for discharge to Home vs SNF. Awaiting PT evaluation.   Consultants:   Physical therapy  Procedures:  None  Antimicrobials:  Vancomycin IV (9/13)  Zosyn (9/13)  Ceftriaxone (9/14>>9/15)   Subjective: Patient reports no problems overnight. He is still having some diarrhea which has improved.  Objective: Vitals:   12/27/15 1715 12/27/15 2202 12/28/15 0611 12/28/15 1430  BP: 138/64 132/64 (!) 130/58 (!) 160/64  Pulse:  77 75 83  Resp:  _0 Temp:  99.4 F (37.4 C) 99.2 F (37.3 C) 99.1 F (37.3 C)  TempSrc:  Oral Oral Oral  SpO2:  100% 99% 100%  Weight:      Height:        Intake/Output Summary (Last 24 hours) at 12/28/15 1823 Last data filed at 12/28/15 1700  Gross per 24 hour  Intake          4025.83 ml  Output             1750 ml  Net          2275.83 ml   Filed Weights   12/27/15 0136  Weight: 72.4 kg (159 lb 11.2 oz)    Examination:  General exam: Appears calm and comfortable Respiratory system: Clear to auscultation. Respiratory effort normal. Cardiovascular system: S1 & S2 heard, RRR. No murmurs, rubs, gallops or clicks. Gastrointestinal system: Abdomen is nondistended, soft and nontender. Normal bowel sounds heard. Central nervous system: Alert and  oriented. No focal neurological deficits. Extremities: No edema. No calf tenderness.  Skin: No cyanosis. Eschar located on left third toe Psychiatry: Judgement and insight appear fair. Mood & affect slightly flat. Patient answers questions appropriately.    Data Reviewed: I have personally  reviewed following labs and imaging studies  CBC:  Recent Labs Lab 12/26/15 1908 12/27/15 0631 12/28/15 0848  WBC 26.1* 24.5* 18.3*  NEUTROABS 23.2*  --   --   HGB 12.2* 11.4* 10.0*  HCT 36.4* 34.7* 30.9*  MCV 89.4 90.8 90.6  PLT 337 354 073   Basic Metabolic Panel:  Recent Labs Lab 12/26/15 1908 12/27/15 0631 12/28/15 0848  NA 134* 133* 134*  K 4.9 4.3 4.2  CL 104 104 109  CO2 20* 21* 20*  GLUCOSE 452* 331* 149*  BUN 55* 50* 36*  CREATININE 2.04* 1.66* 1.13  CALCIUM 9.2 8.8* 8.6*   GFR: Estimated Creatinine Clearance: 64.1 mL/min (by C-G formula based on SCr of 1.13 mg/dL). Liver Function Tests:  Recent Labs Lab 12/26/15 1908 12/27/15 0631  AST 29 51*  ALT 21 26  ALKPHOS 143* 139*  BILITOT 1.0 1.1  PROT 8.1 8.0  ALBUMIN 2.2* 2.0*   No results for input(s): LIPASE, AMYLASE in the last 168 hours. No results for input(s): AMMONIA in the last 168 hours. Coagulation Profile:  Recent Labs Lab 12/27/15 0631 12/28/15 0848  INR 4.82* 4.61*   Cardiac Enzymes: No results for input(s): CKTOTAL, CKMB, CKMBINDEX, TROPONINI in the last 168 hours. BNP (last 3 results) No results for input(s): PROBNP in the last 8760 hours. HbA1C:  Recent Labs  12/27/15 0631  HGBA1C 9.1*   CBG:  Recent Labs Lab 12/27/15 1141 12/27/15 1623 12/27/15 2159 12/28/15 0828 12/28/15 1137  GLUCAP 153* 144* 184* 171* 212*   Lipid Profile: No results for input(s): CHOL, HDL, LDLCALC, TRIG, CHOLHDL, LDLDIRECT in the last 72 hours. Thyroid Function Tests: No results for input(s): TSH, T4TOTAL, FREET4, T3FREE, THYROIDAB in the last 72 hours. Anemia Panel: No results for input(s): VITAMINB12, FOLATE, FERRITIN, TIBC, IRON, RETICCTPCT in the last 72 hours. Sepsis Labs:  Recent Labs Lab 12/26/15 1929  LATICACIDVEN 1.54    Recent Results (from the past 240 hour(s))  Culture, blood (Routine x 2)     Status: None (Preliminary result)   Collection Time: 12/26/15  7:00 PM    Result Value Ref Range Status   Specimen Description BLOOD RIGHT FOREARM  Final   Special Requests BOTTLES DRAWN AEROBIC AND ANAEROBIC 5CC  Final   Culture NO GROWTH 2 DAYS  Final   Report Status PENDING  Incomplete  Culture, blood (Routine x 2)     Status: None (Preliminary result)   Collection Time: 12/26/15  7:08 PM  Result Value Ref Range Status   Specimen Description BLOOD LEFT FOREARM  Final   Special Requests BOTTLES DRAWN AEROBIC AND ANAEROBIC 5CC  Final   Culture NO GROWTH 2 DAYS  Final   Report Status PENDING  Incomplete  Urine culture     Status: Abnormal   Collection Time: 12/26/15  7:42 PM  Result Value Ref Range Status   Specimen Description URINE, CATHETERIZED  Final   Special Requests NONE  Final   Culture 30,000 COLONIES/mL CANDIDA ALBICANS (A)  Final   Report Status 12/28/2015 FINAL  Final  C difficile quick scan w PCR reflex     Status: None   Collection Time: 12/27/15  1:05 AM  Result Value Ref Range Status   C Diff  antigen NEGATIVE NEGATIVE Final   C Diff toxin NEGATIVE NEGATIVE Final   C Diff interpretation No C. difficile detected.  Final  Gastrointestinal Panel by PCR , Stool     Status: Abnormal   Collection Time: 12/27/15  1:05 AM  Result Value Ref Range Status   Campylobacter species NOT DETECTED NOT DETECTED Final   Plesimonas shigelloides NOT DETECTED NOT DETECTED Final   Salmonella species NOT DETECTED NOT DETECTED Final   Yersinia enterocolitica NOT DETECTED NOT DETECTED Final   Vibrio species NOT DETECTED NOT DETECTED Final   Vibrio cholerae NOT DETECTED NOT DETECTED Final   Enteroaggregative E coli (EAEC) NOT DETECTED NOT DETECTED Final   Enteropathogenic E coli (EPEC) DETECTED (A) NOT DETECTED Final    Comment: CRITICAL RESULT CALLED TO, READ BACK BY AND VERIFIED WITH: Ginger Gleeson @ 1354 12/28/15 by [facility]    Enterotoxigenic E coli (ETEC) NOT DETECTED NOT DETECTED Final   Shiga like toxin producing E coli (STEC) NOT DETECTED NOT DETECTED  Final   Shigella/Enteroinvasive E coli (EIEC) NOT DETECTED NOT DETECTED Final   Cryptosporidium NOT DETECTED NOT DETECTED Final   Cyclospora cayetanensis NOT DETECTED NOT DETECTED Final   Entamoeba histolytica NOT DETECTED NOT DETECTED Final   Giardia lamblia NOT DETECTED NOT DETECTED Final   Adenovirus F40/41 NOT DETECTED NOT DETECTED Final   Astrovirus NOT DETECTED NOT DETECTED Final   Norovirus GI/GII NOT DETECTED NOT DETECTED Final   Rotavirus A NOT DETECTED NOT DETECTED Final   Sapovirus (I, II, IV, and V) NOT DETECTED NOT DETECTED Final         Radiology Studies: Dg Chest 2 View  Result Date: 12/26/2015 CLINICAL DATA:  Chest pain, weakness, possible sepsis. EXAM: CHEST  2 VIEW COMPARISON:  None. FINDINGS: Patient is post median sternotomy with prosthetic valve. Lung volumes are low. Mediastinal contours are normal for low volume AP technique. No consolidation. No pulmonary edema, pleural effusion or pneumothorax. No acute osseous abnormalities seen. IMPRESSION: No active cardiopulmonary disease. Electronically Signed   By: Jeb Levering M.D.   On: 12/26/2015 20:42   Dg Knee 2 Views Right  Result Date: 12/26/2015 CLINICAL DATA:  Stump infection. EXAM: RIGHT KNEE - 1-2 VIEW COMPARISON:  Radiographs 6 days prior. FINDINGS: Post below-the-knee amputation with unchanged soft tissue edema of the stump soft tissues. Resection margins remain smooth without bony destructive change. No periosteal reaction. There are dense vascular calcifications, unchanged from prior. Presumed metallic foreign bodies again seen. No tracking soft tissue air. Age medullary rod in the distal femur is partially included. Development of small knee joint effusion. Mild degenerative change about the knee is again seen. IMPRESSION: Post right below-the-knee amputation with soft tissue edema of the distal stump. No tracking soft tissue air. No radiographic findings of osteomyelitis. Electronically Signed   By: Jeb Levering M.D.   On: 12/26/2015 22:06   Dg Foot Complete Left  Result Date: 12/26/2015 CLINICAL DATA:  Stump infection.  Peripheral vascular disease. EXAM: LEFT FOOT - COMPLETE 3+ VIEW COMPARISON:  12/20/2015 FINDINGS: Negative for acute fracture or dislocation. Multiple metallic foreign bodies are again evident about the foot and ankle. Moderate degenerative changes are present at the first MTP. No bony destruction. No soft tissue gas. Extensive vascular calcifications. IMPRESSION: No bony destruction to confirm osteomyelitis.  No soft tissue gas. Electronically Signed   By: Andreas Newport M.D.   On: 12/26/2015 22:04   Ct Renal Stone Study  Result Date: 12/27/2015 CLINICAL DATA:  Sepsis.  Abdominal pain and diarrhea. EXAM: CT ABDOMEN AND PELVIS WITHOUT CONTRAST TECHNIQUE: Multidetector CT imaging of the abdomen and pelvis was performed following the standard protocol without IV contrast. COMPARISON:  None. FINDINGS: Lower chest: Linear atelectasis. No pleural fluid. No focal opacity to suggest pneumonia. Scarring in the periphery of the left lung base. Coronary artery calcifications. 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 Hepatobiliary: Equivocal nodularity of hepatic contours, raising suspicious for cirrhosis. No focal lesion allowing for lack contrast. Probable gallstone within minimally distended gallbladder, no pericholecystic inflammation. No biliary dilatation. Pancreas: Mild motion artifact. No ductal dilatation or inflammation. Spleen: Normal in size without focal abnormality. Adrenals/Urinary Tract: Thickening of the adrenal glands without discrete nodule. Mild bilateral hydroureteronephrosis and bladder is distended. Hydronephrosis likely secondary to bladder distention. No urolithiasis. Cortical scarring in the mid lower left kidney. Mild nonspecific perinephric edema is symmetric. Stomach/Bowel: Small hiatal hernia. Stomach physiologically distended. No small bowel dilatation, obstruction, or  inflammation. Surgical clips in the mesentery of the left upper quadrant. Colon is nondistended, difficult to exclude mild wall thickening involving the ascending, descending and sigmoid colon in the setting of diarrhea. No pericolonic soft tissue stranding. A few distal colonic diverticula, no diverticulitis. Vascular/Lymphatic: Small perigastric and prominent periportal nodes. There is enlarged right inguinal node measuring 1.4 cm short axis. No retroperitoneal adenopathy. Abdominal and branch atherosclerosis without aneurysm. Reproductive: Prostate gland normal in size. Other: No ascites or free air. Musculoskeletal: There are no acute or suspicious osseous abnormalities. Intra medullary rod noted in both proximal femur, partially included. IMPRESSION: 1. Colon is decompressed, difficult to exclude mild wall thickening involving the ascending, descending and sigmoid colon in the setting of diarrhea. No pericolonic inflammation. 2. Urinary bladder distention with subsequent mild bilateral hydronephrosis. No urolithiasis. 3. Question of cirrhotic hepatic morphology. Single gallstone without gallbladder inflammation. Periportal and small perigastric nodes are likely reactive but nonspecific. 4. Aorta and branch atherosclerosis, no aneurysm. R 5 colonic diverticulosis without diverticulitis. 5. Enlarged right inguinal node is nonspecific, suspect this is reactive. Electronically Signed   By: Jeb Levering M.D.   On: 12/27/2015 01:44        Scheduled Meds: . amitriptyline  25 mg Oral QHS  . atorvastatin  40 mg Oral Daily  . carvedilol  6.25 mg Oral BID WC  . cefTRIAXone (ROCEPHIN)  IV  2 g Intravenous Q24H  . ferrous sulfate  325 mg Oral BID WC  . hydrALAZINE  25 mg Oral TID  . insulin aspart  0-15 Units Subcutaneous TID WC  . insulin aspart  0-5 Units Subcutaneous QHS  . insulin glargine  17 Units Subcutaneous QHS  . levothyroxine  25 mcg Oral QAC breakfast  . pregabalin  100 mg Oral Daily   And   . pregabalin  150 mg Oral BID  . Warfarin - Pharmacist Dosing Inpatient   Does not apply q1800  . white petrolatum       Continuous Infusions: . sodium chloride 125 mL/hr at 12/28/15 0220     LOS: 2 days     Cordelia Poche [facility] 12/28/2015, 6:23 PM Pager: [phone]  If 7PM-7AM, please contact night-coverage [email or web address] Password [facility] 12/28/2015, 6:23 PM

## 2015-12-28 NOTE — Progress Notes (Signed)
Inpatient Diabetes Program Recommendations  AACE/ADA: New Consensus Statement on Inpatient Glycemic Control (2015)  Target Ranges:  Prepandial:   less than 140 mg/dL      Peak postprandial:   less than 180 mg/dL (1-2 hours)      Critically ill patients:  140 - 180 mg/dL   Lab Results  Component Value Date   GLUCAP 212 (H) 12/28/2015   HGBA1C 9.1 (H) 12/27/2015    Review of Glycemic Control  Results for Ronald Simpson, Ronald Simpson (MRN [id number]) as of 12/28/2015 13:19  Ref. Range 12/27/2015 11:41 12/27/2015 16:23 12/27/2015 21:59 12/28/2015 08:28 12/28/2015 11:37  Glucose-Capillary Latest Ref Range: 65 - 99 mg/dL 409153 (H) 811144 (H) 914184 (H) 171 (H) 212 (H)    Diabetes history: Type 2, A1C 9.1% Outpatient Diabetes medications: Lantus 17 units qday  Current orders for Inpatient glycemic control: Lantus 17 units qday, Novolog 0-15 units tid, Novolog 0-5units tid  Inpatient Diabetes Program Recommendations: Patient blood sugars remain elevated despite Novolog correction and Lantus insulin.    Fasting blood sugar above ADA guidelines and A1C elevated.  Consider increasing Lantus to 19 units qday.   Susette RacerJulie Chestine Belknap, RN, BA, MHA, CDE Diabetes Coordinator Inpatient Diabetes Program  [phone] (Team Pager) [phone] [facility] Office) 12/28/2015 1:26 PM

## 2015-12-29 ENCOUNTER — Encounter (HOSPITAL_COMMUNITY): Payer: Medicare Other

## 2015-12-29 DIAGNOSIS — A4151 Sepsis due to Escherichia coli [E. coli]: Secondary | ICD-10-CM

## 2015-12-29 LAB — COMPREHENSIVE METABOLIC PANEL
ALBUMIN: 1.6 g/dL — AB (ref 3.5–5.0)
ALK PHOS: 167 U/L — AB (ref 38–126)
ALT: 91 U/L — AB (ref 17–63)
AST: 222 U/L — ABNORMAL HIGH (ref 15–41)
Anion gap: 8 (ref 5–15)
BUN: 26 mg/dL — ABNORMAL HIGH (ref 6–20)
CALCIUM: 8.6 mg/dL — AB (ref 8.9–10.3)
CO2: 18 mmol/L — AB (ref 22–32)
CREATININE: 1.04 mg/dL (ref 0.61–1.24)
Chloride: 111 mmol/L (ref 101–111)
GFR calc Af Amer: >60 mL/min (ref >60)
GFR calc non Af Amer: >60 mL/min (ref >60)
GLUCOSE: 183 mg/dL — AB (ref 65–99)
Potassium: 4.6 mmol/L (ref 3.5–5.1)
SODIUM: 137 mmol/L (ref 135–145)
Total Bilirubin: 0.5 mg/dL (ref 0.3–1.2)
Total Protein: 8 g/dL (ref 6.5–8.1)

## 2015-12-29 LAB — PROTIME-INR
INR: 3.11
Prothrombin Time: 32.7 seconds — ABNORMAL HIGH (ref 11.4–15.2)

## 2015-12-29 LAB — GLUCOSE, CAPILLARY
GLUCOSE-CAPILLARY: 176 mg/dL — AB (ref 65–99)
GLUCOSE-CAPILLARY: 233 mg/dL — AB (ref 65–99)
GLUCOSE-CAPILLARY: 157 mg/dL — AB (ref 65–99)
Glucose-Capillary: 197 mg/dL — ABNORMAL HIGH (ref 65–99)

## 2015-12-29 LAB — CBC
HCT: 33.7 % — ABNORMAL LOW (ref 39.0–52.0)
HEMOGLOBIN: 10.8 g/dL — AB (ref 13.0–17.0)
MCH: 29.5 pg (ref 26.0–34.0)
MCHC: 32 g/dL (ref 30.0–36.0)
MCV: 92.1 fL (ref 78.0–100.0)
PLATELETS: 295 10*3/uL (ref 150–400)
RBC: 3.66 MIL/uL — ABNORMAL LOW (ref 4.22–5.81)
RDW: 13.3 % (ref 11.5–15.5)
WBC: 17 10*3/uL — ABNORMAL HIGH (ref 4.0–10.5)

## 2015-12-29 MED ORDER — WARFARIN SODIUM 3 MG PO TABS
3.5000 mg | ORAL_TABLET | Freq: Once | ORAL | Status: AC
  Administered 2015-12-29: 3.5 mg via ORAL
  Filled 2015-12-29: qty 1

## 2015-12-29 MED ORDER — AMLODIPINE BESYLATE 10 MG PO TABS
10.0000 mg | ORAL_TABLET | Freq: Every day | ORAL | Status: DC
  Administered 2015-12-29 – 2016-01-14 (×16): 10 mg via ORAL
  Filled 2015-12-29 (×17): qty 1

## 2015-12-29 MED ORDER — VANCOMYCIN HCL IN DEXTROSE 1-5 GM/200ML-% IV SOLN
1000.0000 mg | Freq: Two times a day (BID) | INTRAVENOUS | Status: DC
  Administered 2015-12-30 – 2015-12-31 (×4): 1000 mg via INTRAVENOUS
  Filled 2015-12-29 (×5): qty 200

## 2015-12-29 MED ORDER — PIPERACILLIN-TAZOBACTAM 3.375 G IVPB
3.3750 g | Freq: Three times a day (TID) | INTRAVENOUS | Status: DC
  Administered 2015-12-29 – 2016-01-08 (×30): 3.375 g via INTRAVENOUS
  Filled 2015-12-29 (×35): qty 50

## 2015-12-29 MED ORDER — VANCOMYCIN HCL IN DEXTROSE 1-5 GM/200ML-% IV SOLN
1000.0000 mg | Freq: Once | INTRAVENOUS | Status: AC
  Administered 2015-12-29: 1000 mg via INTRAVENOUS
  Filled 2015-12-29: qty 200

## 2015-12-29 NOTE — Progress Notes (Signed)
MD notified that MRI was not able to be done do to fragments seen in L foot. They stated they would need CT ordered.

## 2015-12-29 NOTE — Progress Notes (Signed)
ANTICOAGULATION CONSULT NOTE - Follow-Up Consult  Pharmacy Consult for Coumadin Indication:history of  stroke  No Known Allergies  Patient Measurements: Height: 5\' 10"  (177.8 cm) Weight: 159 lb 11.2 oz (72.4 kg) IBW/kg (Calculated) : 73  Vital Signs: Temp: 98.3 F (36.8 C) (09/16 0610) Temp Source: Oral (09/16 0610) BP: 162/74 (09/16 0610) Pulse Rate: 81 (09/16 0610)  Labs:  Recent Labs  12/27/15 0631 12/28/15 0848 12/29/15 0530  HGB 11.4* 10.0* 10.8*  HCT 34.7* 30.9* 33.7*  PLT 354 321 295  LABPROT 46.5* 43.0* 32.7*  INR 4.82* 4.61* 3.11  CREATININE 1.66* 1.13 1.04    Estimated Creatinine Clearance: 69.6 mL/min (by C-G formula based on SCr of 1.04 mg/dL).   Medical History: Past Medical History:  Diagnosis Date  . CAD (coronary artery disease)   . CVA (cerebral infarction)   . Diabetes mellitus without complication (HCC)   . History of hepatitis B   . History of hepatitis C   . Peripheral vascular disease (HCC)     Medications:  Prescriptions Prior to Admission  Medication Sig Dispense Refill Last Dose  . amitriptyline (ELAVIL) 25 MG tablet Take 25 mg by mouth at bedtime.   Past Week at Unknown time  . amLODipine (NORVASC) 10 MG tablet Take 10 mg by mouth daily.   12/25/2015 at Unknown time  . atorvastatin (LIPITOR) 40 MG tablet Take 40 mg by mouth daily.   Past Week at Unknown time  . carvedilol (COREG) 6.25 MG tablet Take 6.25 mg by mouth 2 (two) times daily with a meal.   12/25/2015 at 0730  . ferrous sulfate 325 (65 FE) MG tablet Take 325 mg by mouth 2 (two) times daily with a meal.   12/25/2015 at Unknown time  . furosemide (LASIX) 20 MG tablet Take 20 mg by mouth daily.   Past Week at Unknown time  . hydrALAZINE (APRESOLINE) 25 MG tablet Take 25 mg by mouth 3 (three) times daily.   12/25/2015 at Unknown time  . insulin glargine (LANTUS) 100 unit/mL SOPN Inject 17 Units into the skin at bedtime.   12/24/2015  . levothyroxine (SYNTHROID, LEVOTHROID) 25 MCG  tablet Take 25 mcg by mouth daily before breakfast.   12/25/2015 at Unknown time  . lisinopril (PRINIVIL,ZESTRIL) 10 MG tablet Take 10 mg by mouth daily.   12/25/2015 at Unknown time  . nitroGLYCERIN (NITROSTAT) 0.4 MG SL tablet Place 0.4 mg under the tongue every 5 (five) minutes as needed for chest pain.   never  . oxyCODONE (OXY IR/ROXICODONE) 5 MG immediate release tablet Take 5 mg by mouth every 4 (four) hours as needed for severe pain.   Past Month at Unknown time  . pregabalin (LYRICA) 50 MG capsule Take 100-150 mg by mouth 3 (three) times daily. Take 100mg  every morning and 150mg  at 1400 and bedtime   12/25/2015 at Unknown time  . spironolactone (ALDACTONE) 25 MG tablet Take 25 mg by mouth daily.   Past Week at Unknown time  . warfarin (COUMADIN) 3 MG tablet Take 7 mg by mouth daily. Takes along with a 4mg  tablet   12/24/2015   Scheduled:  . amitriptyline  25 mg Oral QHS  . atorvastatin  40 mg Oral Daily  . carvedilol  6.25 mg Oral BID WC  . ferrous sulfate  325 mg Oral BID WC  . hydrALAZINE  25 mg Oral TID  . insulin aspart  0-15 Units Subcutaneous TID WC  . insulin aspart  0-5 Units Subcutaneous QHS  .  insulin glargine  17 Units Subcutaneous QHS  . levothyroxine  25 mcg Oral QAC breakfast  . pregabalin  100 mg Oral Daily   And  . pregabalin  150 mg Oral BID  . Warfarin - Pharmacist Dosing Inpatient   Does not apply q1800   Infusions:  . sodium chloride 125 mL/hr at 12/28/15 0220    Assessment: 70yo male admitted for sepsis to continue Coumadin for h/o CVA; last dose was taken 9/11. INR 3.11, slightly supratherapeutic this morning after no warfarin since admission. CBC remains stable and no bleeding is noted in the chart. INR has come down nicely since yesterday so will re-initiate therapy with half of home dose this evening.   PTA dose: 7 mg daily  Goal of Therapy:  INR 2-3   Plan:  Coumadin 3.5 mg X 1 tonight Daily INR/CBC F/U management  Louie BostonEmily Stewart, Pharm  D Pharmacy Resident Pager: [phone]  12/29/2015,9:08 AM

## 2015-12-29 NOTE — Progress Notes (Signed)
Pharmacy Antibiotic Note  Ronald Simpson is a 70 y.o. male admitted on 12/26/2015 with sepsis related to a wound infection. Pharmacy has been consulted for vanc/zosyn dosing. Patient was positive for enteropathogenic e coli as well on his GI panel. Patient did receive 1 dose of vanc and zosyn in the ED and was then switched to ceftriaxone.     Plan: Zosyn 3.375 gm every 8 hours  Vancomycin every 12 hours Monitor cultures, LOT, renal fxn, vancomycin trough as appropriate.   Height: 5\' 10"  (177.8 cm) Weight: 159 lb 11.2 oz (72.4 kg) IBW/kg (Calculated) : 73  Temp (24hrs), Avg:100 F (37.8 C), Min:98.3 F (36.8 C), Max:101.6 F (38.7 C)   Recent Labs Lab 12/26/15 1908 12/26/15 1929 12/27/15 0631 12/28/15 0848 12/29/15 0530  WBC 26.1*  --  24.5* 18.3* 17.0*  CREATININE 2.04*  --  1.66* 1.13 1.04  LATICACIDVEN  --  1.54  --   --   --     Estimated Creatinine Clearance: 69.6 mL/min (by C-G formula based on SCr of 1.04 mg/dL).    No Known Allergies  Antimicrobials this admission: Vanc 9/13 X 1, 9/16>> Zosyn 9/13 x 1, 9/16>> Ceftriaxone 9/14>9/16    Microbiology results: 9/13 BCx: NGTD 9/13 UCx: 30,000 candida albicans 9/16 Wound culture: 9/15 C-diff neg 9/15- GI panel showed enteropathic ecoli  Thank you for allowing pharmacy to be a part of this patient's care.  Delila SpenceEmily A Stewart, Pharm D Pharmacy Resident [phone]:[phone] 12/29/2015 3:06 PM

## 2015-12-29 NOTE — Progress Notes (Signed)
[facility]  PROGRESS NOTE  Ronald Simpson PJA:[id number] DOB: Aug 17, 1945 DOA: 12/26/2015 PCP: Mayford Knife, MD   Brief HPI:  70 y.o. with a history of CAD status post CABG in 2016, history of CVA, diabetes mellitus type 2, hepatitis C, chronic anticoagulation for unknown reasons. He presented with symptoms of malaise for 4 days. Admission, he met sepsis criteria with possible source of urine. C. difficile so was ruled out. GI panel pending    Subjective    Patient says he does not feel good, wants his foot to be examined. Denies pain. No nausea or vomiting.    Assessment/Plan:     1. Sepsis- likely from infected left wound. Wound culture obtained today, start vancomycin and Zosyn. We'll also check MRI of the left foot to rule out underlying osteomyelitis. 2. Wound infection- pus obtained from left third toe, sent for Gram stain and culture. Start vancomycin and Zosyn per pharmacy consultation. Follow culture results/MRI left foot 3. 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 Patient does not have nausea or vomiting. No diarrhea. Will monitor. 4. History of CAD- stable, continue Coreg 5. Hypertension- blood pressure is now elevated, will restart amlodipine. 6. Hypothyroidism-continue Synthroid 7. History of hepatitis C- no history of liver cirrhosis, AST and alkaline phosphatase are slightly elevated. Check CMP in a.m. 8. Diabetes mellitus- continue Lantus and sliding scale insulin with NovoLog. Blood glucose moderately controlled.     DVT prophylaxis: Patient on warfarin  Code Status: Full code  Family Communication: No family at bedside   Disposition Plan: Pending results of MRI foot, Culture   Consultants:  None  Procedures:  None  Antibiotics:   Anti-infectives    Start     Dose/Rate Route Frequency Ordered Stop   12/30/15 0600  vancomycin (VANCOCIN) IVPB 1000 mg/200 mL premix     1,000 mg 200 mL/hr over 60 Minutes  Intravenous Every 12 hours 12/29/15 1505     12/29/15 1800  vancomycin (VANCOCIN) IVPB 1000 mg/200 mL premix     1,000 mg 200 mL/hr over 60 Minutes Intravenous  Once 12/29/15 1505     12/29/15 1600  piperacillin-tazobactam (ZOSYN) IVPB 3.375 g     3.375 g 12.5 mL/hr over 240 Minutes Intravenous Every 8 hours 12/29/15 1505     12/27/15 2200  vancomycin (VANCOCIN) IVPB 1000 mg/200 mL premix  Status:  Discontinued     1,000 mg 200 mL/hr over 60 Minutes Intravenous Every 24 hours 12/27/15 0146 12/27/15 0339   12/27/15 1000  vancomycin (VANCOCIN) 50 mg/mL oral solution 125 mg  Status:  Discontinued     125 mg Oral 4 times daily 12/27/15 0053 12/27/15 0327   12/27/15 0600  piperacillin-tazobactam (ZOSYN) IVPB 3.375 g  Status:  Discontinued     3.375 g 12.5 mL/hr over 240 Minutes Intravenous Every 8 hours 12/27/15 0146 12/27/15 0339   12/27/15 0600  cefTRIAXone (ROCEPHIN) 2 g in dextrose 5 % 50 mL IVPB  Status:  Discontinued     2 g 100 mL/hr over 30 Minutes Intravenous Every 24 hours 12/27/15 0339 12/28/15 1830   12/26/15 2330  piperacillin-tazobactam (ZOSYN) IVPB 3.375 g     3.375 g 100 mL/hr over 30 Minutes Intravenous  Once 12/26/15 2318 12/27/15 0030   12/26/15 2100  vancomycin (VANCOCIN) IVPB 1000 mg/200 mL premix     1,000 mg 200 mL/hr over 60 Minutes Intravenous  Once 12/26/15 2046 12/26/15 2348       Objective   Vitals:   12/28/15 1430  12/28/15 2203 12/29/15 0610 12/29/15 1500  BP: (!) 160/64 138/64 (!) 162/74 (!) 152/83  Pulse: 83 79 81 77  Resp: _0 Temp: 99.1 F (37.3 C) (!) 101.6 F (38.7 C) 98.3 F (36.8 C) 99 F (37.2 C)  TempSrc: Oral Oral Oral Oral  SpO2: 100% 100% 100% 100%  Weight:      Height:        Intake/Output Summary (Last 24 hours) at 12/29/15 1806 Last data filed at 12/29/15 1500  Gross per 24 hour  Intake          2078.75 ml  Output              800 ml  Net          1278.75 ml   Filed Weights   12/27/15 0136  Weight: 72.4 kg (159 lb  11.2 oz)     Physical Examination:  General exam: Appears calm and comfortable. Respiratory system: Clear to auscultation. Respiratory effort normal. Cardiovascular system:  RRR. No  murmurs, rubs, gallops. No pedal edema. GI system: Abdomen is nondistended, soft and nontender. No organomegaly.  Central nervous system. No focal neurological deficits. Musculoskeletal- status post right below-knee amputation Skin: Dry gangrene noted on the medial aspect of the left third toe, pus noted was in from black eschar on deep palpation. Tender to palpation Psychiatry: Alert, oriented x 3.Judgement and insight appear normal. Affect normal.    Data Reviewed: I have personally reviewed following labs and imaging studies  CBG:  Recent Labs Lab 12/28/15 1817 12/28/15 2203 12/29/15 0738 12/29/15 1128 12/29/15 1626  GLUCAP 205* 180* 176* 197* 233*    CBC:  Recent Labs Lab 12/26/15 1908 12/27/15 0631 12/28/15 0848 12/29/15 0530  WBC 26.1* 24.5* 18.3* 17.0*  NEUTROABS 23.2*  --   --   --   HGB 12.2* 11.4* 10.0* 10.8*  HCT 36.4* 34.7* 30.9* 33.7*  MCV 89.4 90.8 90.6 92.1  PLT 337 354 321 701    Basic Metabolic Panel:  Recent Labs Lab 12/26/15 1908 12/27/15 0631 12/28/15 0848 12/29/15 0530  NA 134* 133* 134* 137  K 4.9 4.3 4.2 4.6  CL 104 104 109 111  CO2 20* 21* 20* 18*  GLUCOSE 452* 331* 149* 183*  BUN 55* 50* 36* 26*  CREATININE 2.04* 1.66* 1.13 1.04  CALCIUM 9.2 8.8* 8.6* 8.6*    Recent Results (from the past 240 hour(s))  Culture, blood (Routine x 2)     Status: None (Preliminary result)   Collection Time: 12/26/15  7:00 PM  Result Value Ref Range Status   Specimen Description BLOOD RIGHT FOREARM  Final   Special Requests BOTTLES DRAWN AEROBIC AND ANAEROBIC 5CC  Final   Culture NO GROWTH 3 DAYS  Final   Report Status PENDING  Incomplete  Culture, blood (Routine x 2)     Status: None (Preliminary result)   Collection Time: 12/26/15  7:08 PM  Result Value Ref  Range Status   Specimen Description BLOOD LEFT FOREARM  Final   Special Requests BOTTLES DRAWN AEROBIC AND ANAEROBIC 5CC  Final   Culture NO GROWTH 3 DAYS  Final   Report Status PENDING  Incomplete  Urine culture     Status: Abnormal   Collection Time: 12/26/15  7:42 PM  Result Value Ref Range Status   Specimen Description URINE, CATHETERIZED  Final   Special Requests NONE  Final   Culture 30,000 COLONIES/mL CANDIDA ALBICANS (A)  Final   Report Status  12/28/2015 FINAL  Final  C difficile quick scan w PCR reflex     Status: None   Collection Time: 12/27/15  1:05 AM  Result Value Ref Range Status   C Diff antigen NEGATIVE NEGATIVE Final   C Diff toxin NEGATIVE NEGATIVE Final   C Diff interpretation No C. difficile detected.  Final  Gastrointestinal Panel by PCR , Stool     Status: Abnormal   Collection Time: 12/27/15  1:05 AM  Result Value Ref Range Status   Campylobacter species NOT DETECTED NOT DETECTED Final   Plesimonas shigelloides NOT DETECTED NOT DETECTED Final   Salmonella species NOT DETECTED NOT DETECTED Final   Yersinia enterocolitica NOT DETECTED NOT DETECTED Final   Vibrio species NOT DETECTED NOT DETECTED Final   Vibrio cholerae NOT DETECTED NOT DETECTED Final   Enteroaggregative E coli (EAEC) NOT DETECTED NOT DETECTED Final   Enteropathogenic E coli (EPEC) DETECTED (A) NOT DETECTED Final    Comment: CRITICAL RESULT CALLED TO, READ BACK BY AND VERIFIED WITH: Ginger Gleeson @ 1354 12/28/15 by [facility]    Enterotoxigenic E coli (ETEC) NOT DETECTED NOT DETECTED Final   Shiga like toxin producing E coli (STEC) NOT DETECTED NOT DETECTED Final   Shigella/Enteroinvasive E coli (EIEC) NOT DETECTED NOT DETECTED Final   Cryptosporidium NOT DETECTED NOT DETECTED Final   Cyclospora cayetanensis NOT DETECTED NOT DETECTED Final   Entamoeba histolytica NOT DETECTED NOT DETECTED Final   Giardia lamblia NOT DETECTED NOT DETECTED Final   Adenovirus F40/41 NOT DETECTED NOT DETECTED Final    Astrovirus NOT DETECTED NOT DETECTED Final   Norovirus GI/GII NOT DETECTED NOT DETECTED Final   Rotavirus A NOT DETECTED NOT DETECTED Final   Sapovirus (I, II, IV, and V) NOT DETECTED NOT DETECTED Final  Aerobic Culture (superficial specimen)     Status: None (Preliminary result)   Collection Time: 12/29/15 10:57 AM  Result Value Ref Range Status   Specimen Description WOUND FOOT LEFT  Final   Special Requests Normal  Final   Gram Stain   Final    ABUNDANT WBC PRESENT, PREDOMINANTLY PMN RARE SQUAMOUS EPITHELIAL CELLS PRESENT ABUNDANT GRAM POSITIVE COCCI IN PAIRS IN CLUSTERS FEW GRAM POSITIVE RODS RARE GRAM NEGATIVE RODS    Culture PENDING  Incomplete   Report Status PENDING  Incomplete     Liver Function Tests:  Recent Labs Lab 12/26/15 1908 12/27/15 0631 12/29/15 0530  AST 29 51* 222*  ALT 21 26 91*  ALKPHOS 143* 139* 167*  BILITOT 1.0 1.1 0.5  PROT 8.1 8.0 8.0  ALBUMIN 2.2* 2.0* 1.6*   No results for input(s): LIPASE, AMYLASE in the last 168 hours. No results for input(s): AMMONIA in the last 168 hours.  Cardiac Enzymes: No results for input(s): CKTOTAL, CKMB, CKMBINDEX, TROPONINI in the last 168 hours. BNP (last 3 results) No results for input(s): BNP in the last 8760 hours.  ProBNP (last 3 results) No results for input(s): PROBNP in the last 8760 hours.    Studies: No results found.  Scheduled Meds: . amitriptyline  25 mg Oral QHS  . atorvastatin  40 mg Oral Daily  . carvedilol  6.25 mg Oral BID WC  . ferrous sulfate  325 mg Oral BID WC  . hydrALAZINE  25 mg Oral TID  . insulin aspart  0-15 Units Subcutaneous TID WC  . insulin aspart  0-5 Units Subcutaneous QHS  . insulin glargine  17 Units Subcutaneous QHS  . levothyroxine  25 mcg Oral QAC breakfast  .  piperacillin-tazobactam (ZOSYN)  IV  3.375 g Intravenous Q8H  . pregabalin  100 mg Oral Daily   And  . pregabalin  150 mg Oral BID  . vancomycin  1,000 mg Intravenous Once  . [START ON 12/30/2015]  vancomycin  1,000 mg Intravenous Q12H  . Warfarin - Pharmacist Dosing Inpatient   Does not apply q1800    Continuous Infusions: . sodium chloride 125 mL/hr at 12/29/15 1258    Time spent: 25 min  [facility] Pager [phone]. If 7PM-7AM, please contact night-coverage at [email or web address], Office  [phone]  password [email or web address] 12/29/2015, 6:06 PM  LOS: 3 days

## 2015-12-29 NOTE — Progress Notes (Signed)
PT Cancellation Note  Patient Details Name: Ronald Simpson MRN: [id number] DOB: 02/11/1946   Cancelled Treatment:    Reason Eval/Treat Not Completed: Other (comment) (Pt declined for now and will try later as time allows.).     Ivar DrapeStout, Bell Cai E 12/29/2015, 12:57 PM

## 2015-12-30 ENCOUNTER — Inpatient Hospital Stay (HOSPITAL_COMMUNITY): Payer: Medicare Other

## 2015-12-30 DIAGNOSIS — I739 Peripheral vascular disease, unspecified: Secondary | ICD-10-CM

## 2015-12-30 LAB — GLUCOSE, CAPILLARY
GLUCOSE-CAPILLARY: 134 mg/dL — AB (ref 65–99)
GLUCOSE-CAPILLARY: 144 mg/dL — AB (ref 65–99)
GLUCOSE-CAPILLARY: 191 mg/dL — AB (ref 65–99)
Glucose-Capillary: 165 mg/dL — ABNORMAL HIGH (ref 65–99)

## 2015-12-30 LAB — PROTIME-INR
INR: 2.88
PROTHROMBIN TIME: 30.8 s — AB (ref 11.4–15.2)

## 2015-12-30 MED ORDER — IOPAMIDOL (ISOVUE-300) INJECTION 61%
INTRAVENOUS | Status: AC
  Administered 2015-12-30: 100 mL
  Filled 2015-12-30: qty 100

## 2015-12-30 MED ORDER — WARFARIN SODIUM 1 MG PO TABS: 7.0000 mg | ORAL_TABLET | Freq: Once | ORAL | Status: DC

## 2015-12-30 MED ORDER — WARFARIN SODIUM 3 MG PO TABS
3.5000 mg | ORAL_TABLET | Freq: Once | ORAL | Status: AC
  Administered 2015-12-30: 3.5 mg via ORAL
  Filled 2015-12-30: qty 1

## 2015-12-30 NOTE — Progress Notes (Signed)
ANTICOAGULATION CONSULT NOTE - Follow-Up Consult  Pharmacy Consult for Coumadin Indication:history of  stroke  No Known Allergies  Patient Measurements: Height: 5\' 10"  (177.8 cm) Weight: 159 lb 11.2 oz (72.4 kg) IBW/kg (Calculated) : 73  Vital Signs: Temp: 98.4 F (36.9 C) (09/17 0453) BP: 144/65 (09/17 0453) Pulse Rate: 79 (09/17 0453)  Labs:  Recent Labs  12/28/15 0848 12/29/15 0530 12/30/15 0626  HGB 10.0* 10.8*  --   HCT 30.9* 33.7*  --   PLT 321 295  --   LABPROT 43.0* 32.7* 30.8*  INR 4.61* 3.11 2.88  CREATININE 1.13 1.04  --     Estimated Creatinine Clearance: 69.6 mL/min (by C-G formula based on SCr of 1.04 mg/dL).   Medical History: Past Medical History:  Diagnosis Date  . CAD (coronary artery disease)   . CVA (cerebral infarction)   . Diabetes mellitus without complication (HCC)   . History of hepatitis B   . History of hepatitis C   . Peripheral vascular disease (HCC)     Medications:  Prescriptions Prior to Admission  Medication Sig Dispense Refill Last Dose  . amitriptyline (ELAVIL) 25 MG tablet Take 25 mg by mouth at bedtime.   Past Week at Unknown time  . amLODipine (NORVASC) 10 MG tablet Take 10 mg by mouth daily.   12/25/2015 at Unknown time  . atorvastatin (LIPITOR) 40 MG tablet Take 40 mg by mouth daily.   Past Week at Unknown time  . carvedilol (COREG) 6.25 MG tablet Take 6.25 mg by mouth 2 (two) times daily with a meal.   12/25/2015 at 0730  . ferrous sulfate 325 (65 FE) MG tablet Take 325 mg by mouth 2 (two) times daily with a meal.   12/25/2015 at Unknown time  . furosemide (LASIX) 20 MG tablet Take 20 mg by mouth daily.   Past Week at Unknown time  . hydrALAZINE (APRESOLINE) 25 MG tablet Take 25 mg by mouth 3 (three) times daily.   12/25/2015 at Unknown time  . insulin glargine (LANTUS) 100 unit/mL SOPN Inject 17 Units into the skin at bedtime.   12/24/2015  . levothyroxine (SYNTHROID, LEVOTHROID) 25 MCG tablet Take 25 mcg by mouth daily  before breakfast.   12/25/2015 at Unknown time  . lisinopril (PRINIVIL,ZESTRIL) 10 MG tablet Take 10 mg by mouth daily.   12/25/2015 at Unknown time  . nitroGLYCERIN (NITROSTAT) 0.4 MG SL tablet Place 0.4 mg under the tongue every 5 (five) minutes as needed for chest pain.   never  . oxyCODONE (OXY IR/ROXICODONE) 5 MG immediate release tablet Take 5 mg by mouth every 4 (four) hours as needed for severe pain.   Past Month at Unknown time  . pregabalin (LYRICA) 50 MG capsule Take 100-150 mg by mouth 3 (three) times daily. Take 100mg  every morning and 150mg  at 1400 and bedtime   12/25/2015 at Unknown time  . spironolactone (ALDACTONE) 25 MG tablet Take 25 mg by mouth daily.   Past Week at Unknown time  . warfarin (COUMADIN) 3 MG tablet Take 7 mg by mouth daily. Takes along with a 4mg  tablet   12/24/2015   Scheduled:  . amitriptyline  25 mg Oral QHS  . amLODipine  10 mg Oral Daily  . atorvastatin  40 mg Oral Daily  . carvedilol  6.25 mg Oral BID WC  . ferrous sulfate  325 mg Oral BID WC  . hydrALAZINE  25 mg Oral TID  . insulin aspart  0-15 Units Subcutaneous TID  WC  . insulin aspart  0-5 Units Subcutaneous QHS  . insulin glargine  17 Units Subcutaneous QHS  . levothyroxine  25 mcg Oral QAC breakfast  . piperacillin-tazobactam (ZOSYN)  IV  3.375 g Intravenous Q8H  . pregabalin  100 mg Oral Daily   And  . pregabalin  150 mg Oral BID  . vancomycin  1,000 mg Intravenous Q12H  . Warfarin - Pharmacist Dosing Inpatient   Does not apply q1800   Infusions:  . sodium chloride 125 mL/hr at 12/29/15 1258    Assessment: 70yo male admitted for sepsis related to a wound infection to continue Coumadin for h/o CVA. . INR 2.88, therapeutic this morning after several held doses (Last home dose:9/11) and 1 dose of 3.5 mg last night . CBC remains stable as of yesterday and no bleeding is noted in the chart. INR continues to trend down due to held doses. Will repeat 3.5 mg tonight.  PTA dose: 7 mg daily  Goal  of Therapy:  INR 2-3   Plan:  Coumadin 3.5 mg X 1 tonight Daily INR Re-check CBC F/U management  Bailey MechEmily Stewart, Ilda BassetPharm D Pharmacy Resident Pager: [phone]  12/30/2015,9:04 AM

## 2015-12-30 NOTE — Progress Notes (Signed)
PT Cancellation Note  Patient Details Name: Ronald Simpson MRN: [id number] DOB: [birth year]   Cancelled Treatment:    Pt refused PT for second day stating that he felt too poorly to try to get out of bed or even sit edge of bed. Pt assisted in scooting up in bed and encouraged to participate with PT for the benefits of mobility.   Shreya Lacasse, [place] 12/30/2015, 12:39 PM

## 2015-12-30 NOTE — Progress Notes (Addendum)
VASCULAR LAB PRELIMINARY  ARTERIAL  ABI completed:Right BKA.  Unable to obtain left ABI secondary to noncompressible vessels.  Waveforms are abnormal. Left Great toe pressure is abnormal    RIGHT    LEFT    PRESSURE WAVEFORM  PRESSURE WAVEFORM  BRACHIAL 137 B BRACHIAL  B  DP BKA  DP >300 M  AT   AT    PT   PT >300 M  PER   PER    GREAT TOE  NA GREAT TOE 39 NA    RIGHT LEFT  ABI  noncompressible     Tao Satz, RVT 12/30/2015, 10:39 AM

## 2015-12-30 NOTE — Progress Notes (Signed)
Upon assessment, purulent drainage was noted to previously noted abrasion on RBKA. MD notified. No new orders at this time.

## 2015-12-30 NOTE — Progress Notes (Signed)
[facility]  PROGRESS NOTE  Ronald Simpson:[id number] DOB: November 20, 1945 DOA: 12/26/2015 PCP: [name], MD   Brief HPI:  70 y.o. with a history of CAD status post CABG in 2016, history of CVA, diabetes mellitus type 2, hepatitis C, chronic anticoagulation for unknown reasons. He presented with symptoms of malaise for 4 days. He was found to have sepsis, possibly from the left foot vs diarrhea.    Subjective    Patient reports pain slightly more than yesterday. No nausea or vomiting.    Assessment/Plan:     1. Sepsis- likely from infected left wound. Wound culture obtained today, started vancomycin and Zosyn. We'll also check CT  of the left foot to rule out underlying osteomyelitis. 2. Wound infection- pus obtained from left third toe, sent for Gram stain and culture, PENDING.  Started vancomycin and Zosyn per pharmacy consultation. Follow culture results.  3. 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 Patient does not have nausea or vomiting. No diarrhea. Will monitor. Symptomatic management.  4. History of CAD- stable, continue Coreg 5. Hypertension- sub optimal , restarted anti hypertensive meds. Monitor on medications and titrate as needed.  6. Hypothyroidism-continue Synthroid 7. History of hepatitis C- no history of liver cirrhosis, AST and alkaline phosphatase are slightly elevated. Plan for CMP IN am, follow up repeat AST.  8. Diabetes mellitus- continue Lantus and sliding scale insulin with NovoLog. Blood glucose moderately controlled. CBG (last 3)   Recent Labs  12/29/15 1626 12/29/15 2029 12/30/15 0820  GLUCAP 233* 157* 134*    No change in doses.      DVT prophylaxis: Patient on warfarin  Code Status: Full code  Family Communication: No family at bedside   Disposition Plan: Pending results of CT foot.    Consultants:  None  Procedures:  None  Antibiotics:   Anti-infectives    Start     Dose/Rate Route  Frequency Ordered Stop   12/30/15 0600  vancomycin (VANCOCIN) IVPB 1000 mg/200 mL premix     1,000 mg 200 mL/hr over 60 Minutes Intravenous Every 12 hours 12/29/15 1505     12/29/15 1800  vancomycin (VANCOCIN) IVPB 1000 mg/200 mL premix     1,000 mg 200 mL/hr over 60 Minutes Intravenous  Once 12/29/15 1505 12/29/15 1834   12/29/15 1600  piperacillin-tazobactam (ZOSYN) IVPB 3.375 g     3.375 g 12.5 mL/hr over 240 Minutes Intravenous Every 8 hours 12/29/15 1505     12/27/15 2200  vancomycin (VANCOCIN) IVPB 1000 mg/200 mL premix  Status:  Discontinued     1,000 mg 200 mL/hr over 60 Minutes Intravenous Every 24 hours 12/27/15 0146 12/27/15 0339   12/27/15 1000  vancomycin (VANCOCIN) 50 mg/mL oral solution 125 mg  Status:  Discontinued     125 mg Oral 4 times daily 12/27/15 0053 12/27/15 0327   12/27/15 0600  piperacillin-tazobactam (ZOSYN) IVPB 3.375 g  Status:  Discontinued     3.375 g 12.5 mL/hr over 240 Minutes Intravenous Every 8 hours 12/27/15 0146 12/27/15 0339   12/27/15 0600  cefTRIAXone (ROCEPHIN) 2 g in dextrose 5 % 50 mL IVPB  Status:  Discontinued     2 g 100 mL/hr over 30 Minutes Intravenous Every 24 hours 12/27/15 0339 12/28/15 1830   12/26/15 2330  piperacillin-tazobactam (ZOSYN) IVPB 3.375 g     3.375 g 100 mL/hr over 30 Minutes Intravenous  Once 12/26/15 2318 12/27/15 0030   12/26/15 2100  vancomycin (VANCOCIN) IVPB 1000 mg/200 mL premix  1,000 mg 200 mL/hr over 60 Minutes Intravenous  Once 12/26/15 2046 12/26/15 2348       Objective   Vitals:   12/29/15 0610 12/29/15 1500 12/29/15 2151 12/30/15 0453  BP: (!) 162/74 (!) 152/83 (!) 163/67 (!) 144/65  Pulse: 81 77 82 79  Resp: 17 18 19 20   Temp: 98.3 F (36.8 C) 99 F (37.2 C) 98.4 F (36.9 C) 98.4 F (36.9 C)  TempSrc: Oral Oral    SpO2: 100% 100% 93% 98%  Weight:      Height:        Intake/Output Summary (Last 24 hours) at 12/30/15 1100 Last data filed at 12/30/15 0900  Gross per 24 hour  Intake            2322.5 ml  Output             1830 ml  Net            492.5 ml   Filed Weights   12/27/15 0136  Weight: 72.4 kg (159 lb 11.2 oz)     Physical Examination:  General exam: Appears calm and comfortable. Respiratory system: Clear to auscultation. Respiratory effort normal. Cardiovascular system:  RRR. No  murmurs, rubs, gallops. No pedal edema. GI system: Abdomen is nondistended, soft and nontender. No organomegaly.  Central nervous system. No focal neurological deficits. Musculoskeletal- status post right below-knee amputation Skin: Dry gangrene noted on the medial aspect of the left third toe, pus noted was in from black eschar on deep palpation. Tender to palpation Psychiatry: Alert, oriented x 3.Judgement and insight appear normal. Affect normal.    Data Reviewed: I have personally reviewed following labs and imaging studies  CBG:  Recent Labs Lab 12/29/15 0738 12/29/15 1128 12/29/15 1626 12/29/15 2029 12/30/15 0820  GLUCAP 176* 197* 233* 157* 134*    CBC:  Recent Labs Lab 12/26/15 1908 12/27/15 0631 12/28/15 0848 12/29/15 0530  WBC 26.1* 24.5* 18.3* 17.0*  NEUTROABS 23.2*  --   --   --   HGB 12.2* 11.4* 10.0* 10.8*  HCT 36.4* 34.7* 30.9* 33.7*  MCV 89.4 90.8 90.6 92.1  PLT 337 354 321 295    Basic Metabolic Panel:  Recent Labs Lab 12/26/15 1908 12/27/15 0631 12/28/15 0848 12/29/15 0530  NA 134* 133* 134* 137  K 4.9 4.3 4.2 4.6  CL 104 104 109 111  CO2 20* 21* 20* 18*  GLUCOSE 452* 331* 149* 183*  BUN 55* 50* 36* 26*  CREATININE 2.04* 1.66* 1.13 1.04  CALCIUM 9.2 8.8* 8.6* 8.6*    Recent Results (from the past 240 hour(s))  Culture, blood (Routine x 2)     Status: None (Preliminary result)   Collection Time: 12/26/15  7:00 PM  Result Value Ref Range Status   Specimen Description BLOOD RIGHT FOREARM  Final   Special Requests BOTTLES DRAWN AEROBIC AND ANAEROBIC 5CC  Final   Culture NO GROWTH 3 DAYS  Final   Report Status PENDING   Incomplete  Culture, blood (Routine x 2)     Status: None (Preliminary result)   Collection Time: 12/26/15  7:08 PM  Result Value Ref Range Status   Specimen Description BLOOD LEFT FOREARM  Final   Special Requests BOTTLES DRAWN AEROBIC AND ANAEROBIC 5CC  Final   Culture NO GROWTH 3 DAYS  Final   Report Status PENDING  Incomplete  Urine culture     Status: Abnormal   Collection Time: 12/26/15  7:42 PM  Result Value Ref Range  Status   Specimen Description URINE, CATHETERIZED  Final   Special Requests NONE  Final   Culture 30,000 COLONIES/mL CANDIDA ALBICANS (A)  Final   Report Status 12/28/2015 FINAL  Final  C difficile quick scan w PCR reflex     Status: None   Collection Time: 12/27/15  1:05 AM  Result Value Ref Range Status   C Diff antigen NEGATIVE NEGATIVE Final   C Diff toxin NEGATIVE NEGATIVE Final   C Diff interpretation No C. difficile detected.  Final  Gastrointestinal Panel by PCR , Stool     Status: Abnormal   Collection Time: 12/27/15  1:05 AM  Result Value Ref Range Status   Campylobacter species NOT DETECTED NOT DETECTED Final   Plesimonas shigelloides NOT DETECTED NOT DETECTED Final   Salmonella species NOT DETECTED NOT DETECTED Final   Yersinia enterocolitica NOT DETECTED NOT DETECTED Final   Vibrio species NOT DETECTED NOT DETECTED Final   Vibrio cholerae NOT DETECTED NOT DETECTED Final   Enteroaggregative E coli (EAEC) NOT DETECTED NOT DETECTED Final   Enteropathogenic E coli (EPEC) DETECTED (A) NOT DETECTED Final    Comment: CRITICAL RESULT CALLED TO, READ BACK BY AND VERIFIED WITH: Ginger Gleeson @ 1354 12/28/15 by [facility]    Enterotoxigenic E coli (ETEC) NOT DETECTED NOT DETECTED Final   Shiga like toxin producing E coli (STEC) NOT DETECTED NOT DETECTED Final   Shigella/Enteroinvasive E coli (EIEC) NOT DETECTED NOT DETECTED Final   Cryptosporidium NOT DETECTED NOT DETECTED Final   Cyclospora cayetanensis NOT DETECTED NOT DETECTED Final   Entamoeba histolytica  NOT DETECTED NOT DETECTED Final   Giardia lamblia NOT DETECTED NOT DETECTED Final   Adenovirus F40/41 NOT DETECTED NOT DETECTED Final   Astrovirus NOT DETECTED NOT DETECTED Final   Norovirus GI/GII NOT DETECTED NOT DETECTED Final   Rotavirus A NOT DETECTED NOT DETECTED Final   Sapovirus (I, II, IV, and V) NOT DETECTED NOT DETECTED Final  Aerobic Culture (superficial specimen)     Status: None (Preliminary result)   Collection Time: 12/29/15 10:57 AM  Result Value Ref Range Status   Specimen Description WOUND FOOT LEFT  Final   Special Requests Normal  Final   Gram Stain   Final    ABUNDANT WBC PRESENT, PREDOMINANTLY PMN RARE SQUAMOUS EPITHELIAL CELLS PRESENT ABUNDANT GRAM POSITIVE COCCI IN PAIRS IN CLUSTERS FEW GRAM POSITIVE RODS RARE GRAM NEGATIVE RODS    Culture PENDING  Incomplete   Report Status PENDING  Incomplete     Liver Function Tests:  Recent Labs Lab 12/26/15 1908 12/27/15 0631 12/29/15 0530  AST 29 51* 222*  ALT 21 26 91*  ALKPHOS 143* 139* 167*  BILITOT 1.0 1.1 0.5  PROT 8.1 8.0 8.0  ALBUMIN 2.2* 2.0* 1.6*   No results for input(s): LIPASE, AMYLASE in the last 168 hours. No results for input(s): AMMONIA in the last 168 hours.  Cardiac Enzymes: No results for input(s): CKTOTAL, CKMB, CKMBINDEX, TROPONINI in the last 168 hours. BNP (last 3 results) No results for input(s): BNP in the last 8760 hours.  ProBNP (last 3 results) No results for input(s): PROBNP in the last 8760 hours.    Studies: No results found.  Scheduled Meds: . amitriptyline  25 mg Oral QHS  . amLODipine  10 mg Oral Daily  . atorvastatin  40 mg Oral Daily  . carvedilol  6.25 mg Oral BID WC  . ferrous sulfate  325 mg Oral BID WC  . hydrALAZINE  25 mg Oral  TID  . insulin aspart  0-15 Units Subcutaneous TID WC  . insulin aspart  0-5 Units Subcutaneous QHS  . insulin glargine  17 Units Subcutaneous QHS  . levothyroxine  25 mcg Oral QAC breakfast  . piperacillin-tazobactam  (ZOSYN)  IV  3.375 g Intravenous Q8H  . pregabalin  100 mg Oral Daily   And  . pregabalin  150 mg Oral BID  . vancomycin  1,000 mg Intravenous Q12H  . warfarin  3.5 mg Oral ONCE-1800  . Warfarin - Pharmacist Dosing Inpatient   Does not apply q1800    Continuous Infusions: . sodium chloride 125 mL/hr at 12/30/15 1048    Time spent: 25 min  [facility] Center[facility]ULA,Severina Sykora   [facility] Pager [phone] If 7PM-7AM, please contact night-coverage at [email or web address], Office  [phone]  password [email or web address] 12/30/2015, 11:00 AM  LOS: 4 days

## 2015-12-31 DIAGNOSIS — I96 Gangrene, not elsewhere classified: Secondary | ICD-10-CM

## 2015-12-31 LAB — CBC
HEMATOCRIT: 30 % — AB (ref 39.0–52.0)
HEMOGLOBIN: 9.8 g/dL — AB (ref 13.0–17.0)
MCH: 29.3 pg (ref 26.0–34.0)
MCHC: 32.7 g/dL (ref 30.0–36.0)
MCV: 89.6 fL (ref 78.0–100.0)
Platelets: 290 10*3/uL (ref 150–400)
RBC: 3.35 MIL/uL — ABNORMAL LOW (ref 4.22–5.81)
RDW: 13.1 % (ref 11.5–15.5)
WBC: 15.3 10*3/uL — AB (ref 4.0–10.5)

## 2015-12-31 LAB — CULTURE, BLOOD (ROUTINE X 2)
Culture: NO GROWTH
Culture: NO GROWTH

## 2015-12-31 LAB — COMPREHENSIVE METABOLIC PANEL
ALBUMIN: 1.5 g/dL — AB (ref 3.5–5.0)
ALT: 50 U/L (ref 17–63)
ANION GAP: 6 (ref 5–15)
AST: 50 U/L — AB (ref 15–41)
Alkaline Phosphatase: 176 U/L — ABNORMAL HIGH (ref 38–126)
BILIRUBIN TOTAL: 0.5 mg/dL (ref 0.3–1.2)
BUN: 14 mg/dL (ref 6–20)
CO2: 20 mmol/L — AB (ref 22–32)
Calcium: 8.1 mg/dL — ABNORMAL LOW (ref 8.9–10.3)
Chloride: 104 mmol/L (ref 101–111)
Creatinine, Ser: 1.2 mg/dL (ref 0.61–1.24)
GFR calc Af Amer: >60 mL/min (ref >60)
GFR calc non Af Amer: >60 mL/min (ref >60)
GLUCOSE: 165 mg/dL — AB (ref 65–99)
POTASSIUM: 4 mmol/L (ref 3.5–5.1)
SODIUM: 130 mmol/L — AB (ref 135–145)
TOTAL PROTEIN: 6.3 g/dL — AB (ref 6.5–8.1)

## 2015-12-31 LAB — GLUCOSE, CAPILLARY
GLUCOSE-CAPILLARY: 160 mg/dL — AB (ref 65–99)
GLUCOSE-CAPILLARY: 213 mg/dL — AB (ref 65–99)
GLUCOSE-CAPILLARY: 146 mg/dL — AB (ref 65–99)
Glucose-Capillary: 150 mg/dL — ABNORMAL HIGH (ref 65–99)

## 2015-12-31 LAB — AEROBIC CULTURE W GRAM STAIN (SUPERFICIAL SPECIMEN): Special Requests: NORMAL

## 2015-12-31 LAB — AEROBIC CULTURE  (SUPERFICIAL SPECIMEN)

## 2015-12-31 LAB — PROTIME-INR
INR: 3.41
PROTHROMBIN TIME: 35.2 s — AB (ref 11.4–15.2)

## 2015-12-31 LAB — VANCOMYCIN, TROUGH: Vancomycin Tr: 20 ug/mL (ref 15–20)

## 2015-12-31 MED ORDER — VANCOMYCIN HCL IN DEXTROSE 750-5 MG/150ML-% IV SOLN
750.0000 mg | Freq: Two times a day (BID) | INTRAVENOUS | Status: DC
  Administered 2016-01-01 – 2016-01-09 (×17): 750 mg via INTRAVENOUS
  Filled 2015-12-31 (×19): qty 150

## 2015-12-31 NOTE — Consult Note (Signed)
[facility] [place] CONSULT [place]   MRN : [id number]  Reason for Consult: left foot ischemic changes Referring Physician: Dr. Jomarie LongsJoseph  History of Present Illness: 70 Y/O male seen in consultation regarding ischemic changes in left foot and poorly healing right below-knee amputation. He has a very complex past history. Does have a history of peripheral vascular disease and also coronary disease. Reports a remote history of stroke but reports that he was able to walk and function normally prior to his right below-knee amputation. He is status post coronary bypass grafting in 2015. Had vein harvest from his left thigh. Reports that he initially had the ulceration over his great and second toe on the right and initially underwent debridement. He subsequently developed a gangrenous changes and eventually ended up with a right below-knee amputation and [facility]. Reportedly initially had adequate healing. Over time he reports that he did fall and had episode where he had to have reclosure of his amputation on the right. His medical records indicate that [facility]. He presented to our hospital with fever of 102 and a markedly elevated white count and disorientation with presumed sepsis. Sources possible were respiratory, urinary tract, left foot or right amputation stump. He does report that he had noted changes over the toes of his left foot and does have some pain associated with this as well. He denies any arteriogram evaluation prior to his amputation although do not have records for this. Does report ongoing pain associated with his right below-knee amputation     Current Facility-Administered Medications  Medication Dose Route Frequency Provider Last Rate Last Dose  . 0.9 %  sodium chloride infusion   Intravenous Continuous Kathlen ModyVijaya Akula, MD 75 mL/hr at 12/30/15 1111    .  acetaminophen (TYLENOL) tablet 650 mg  650 mg Oral Q6H PRN Alberteen Samhristopher P Danford, MD   650 mg at 12/28/15 2207   Or  . acetaminophen (TYLENOL) suppository 650 mg  650 mg Rectal Q6H PRN Alberteen Samhristopher P Danford, MD      . amitriptyline (ELAVIL) tablet 25 mg  25 mg Oral QHS Alberteen Samhristopher P Danford, MD   25 mg at 12/30/15 2153  . amLODipine (NORVASC) tablet 10 mg  10 mg Oral Daily Meredeth IdeGagan S Lama, MD   10 mg at 12/31/15 0850  . atorvastatin (LIPITOR) tablet 40 mg  40 mg Oral Daily Alberteen Samhristopher P Danford, MD   40 mg at 12/31/15 0849  . carvedilol (COREG) tablet 6.25 mg  6.25 mg Oral BID WC Narda Bondsalph A Nettey, MD   6.25 mg at 12/31/15 0850  . ferrous sulfate tablet 325 mg  325 mg Oral BID WC Alberteen Samhristopher P Danford, MD   325 mg at 12/31/15 0849  . hydrALAZINE (APRESOLINE) tablet 25 mg  25 mg Oral TID Alberteen Samhristopher P Danford, MD   25 mg at 12/31/15 0849  . insulin aspart (novoLOG) injection 0-15 Units  0-15 Units Subcutaneous TID WC Alberteen Samhristopher P Danford, MD   2 Units at 12/31/15 0848  . insulin aspart (novoLOG) injection 0-5 Units  0-5 Units Subcutaneous QHS Alberteen Samhristopher P Danford, MD   5 Units at 12/27/15 0225  . insulin glargine (LANTUS) injection 17 Units  17 Units Subcutaneous QHS Alberteen Samhristopher P Danford, MD   17 Units at 12/30/15 2255  . levothyroxine (SYNTHROID, LEVOTHROID) tablet 25 mcg  25 mcg Oral QAC breakfast Alberteen Samhristopher P Danford, MD   [phone]  mcg at 12/31/15 0849  . ondansetron (ZOFRAN) tablet 4 mg  4 mg Oral Q6H PRN Alberteen Sam, MD       Or  . ondansetron (ZOFRAN) injection 4 mg  4 mg Intravenous Q6H PRN Alberteen Sam, MD      . oxyCODONE (Oxy IR/ROXICODONE) immediate release tablet 5 mg  5 mg Oral Q6H PRN Narda Bonds, MD   5 mg at 12/31/15 0558  . piperacillin-tazobactam (ZOSYN) IVPB 3.375 g  3.375 g Intravenous Q8H Hillis Range, RPH   3.375 g at 12/31/15 0528  . pregabalin (LYRICA) capsule 100 mg  100 mg Oral Daily Alberteen Sam, MD   100 mg at 12/31/15 0849   And  . pregabalin  (LYRICA) capsule 150 mg  150 mg Oral BID Alberteen Sam, MD   150 mg at 12/30/15 2153  . vancomycin (VANCOCIN) IVPB 1000 mg/200 mL premix  1,000 mg Intravenous Q12H Hillis Range, RPH   1,000 mg at 12/31/15 0528  . Warfarin - Pharmacist Dosing Inpatient   Does not apply q1800 Juliette Mangle, [facility]       *  Past Medical History:  Diagnosis Date  . CAD (coronary artery disease)   . CVA (cerebral infarction)   . Diabetes mellitus without complication (HCC)   . History of hepatitis B   . History of hepatitis C   . Peripheral vascular disease [facility])     Past Surgical History:  Procedure Laterality Date  . BELOW KNEE LEG AMPUTATION Right   . CORONARY ARTERY BYPASS GRAFT  2005  . STERNOTOMY      Social History Social History  Substance Use Topics  . Smoking status: Current Every Day Smoker    Packs/day: 0.50  . Smokeless tobacco: Never Used  . Alcohol use Not on file    Family History Family History  Problem Relation Age of Onset  . Hypertension Other   . Diabetes Other   . Mental illness Other   . Lupus Other     No Known Allergies   REVIEW OF SYSTEMS  Reviewed in his history and physical with nothing to add   Physical Examination Vitals:   12/30/15 0453 12/30/15 2100 12/31/15 0559 12/31/15 0848  BP: (!) 144/65 (!) 142/67 137/71 137/87  Pulse: 79 78 79 82  Resp: 20 20 20    Temp: 98.4 F (36.9 C) 99.1 F (37.3 C) 99 F (37.2 C)   TempSrc:  Oral Oral   SpO2: 98% 100% 100%   Weight:      Height:       Body mass index is 22.91 kg/m.  General:  WDWN in NAD HENT: WNL Eyes: Pupils equal Pulmonary: normal non-labored breathing  Abdomen: soft, NT, no masses Skin: No rashes or ulcers. This is than his right BKA and left foot Right below-knee amputation with an area open in the mid pretibial area. Copious pus draining out of this area with clinical evidence of involvement down to the bone. Left foot with dry gangrenous changes on the tip of his second  and fourth toes. No fluctuance or area of infection noted in his left foot.  Vascular Exam/Pulses: Palpable femoral and 2+ popliteal pulses bilaterally. Absent left pedal pulses  Does have biphasic signal in his left posterior tibial and monophasic in his left dorsalis pedis   Musculoskeletal: no muscle wasting or atrophy; no edema  Neurologic: A&O X 3; Appropriate Affect ;  SENSATION: normal; MOTOR FUNCTION: 5/5 Symmetric Speech  is fluent/normal   Significant Diagnostic Studies: CBC Lab Results  Component Value Date   WBC 15.3 (H) 12/31/2015   HGB 9.8 (L) 12/31/2015   HCT 30.0 (L) 12/31/2015   MCV 89.6 12/31/2015   PLT 290 12/31/2015    BMET    Component Value Date/Time   NA 130 (L) 12/31/2015 0824   K 4.0 12/31/2015 0824   CL 104 12/31/2015 0824   CO2 20 (L) 12/31/2015 0824   GLUCOSE 165 (H) 12/31/2015 0824   BUN 14 12/31/2015 0824   CREATININE 1.20 12/31/2015 0824   CALCIUM 8.1 (L) 12/31/2015 0824   GFRNONAA >60 12/31/2015 0824   GFRAA >60 12/31/2015 0824   Estimated Creatinine Clearance: 60.3 mL/min (by C-G formula based on SCr of 1.2 mg/dL).  COAG Lab Results  Component Value Date   INR 3.41 12/31/2015   INR 2.88 12/30/2015   INR 3.11 12/29/2015     Non-Invasive Vascular Imaging: Has calcified vessels therefore ankle arm indices is unreliable  Underwent a CT of his foot. This shows no evidence of obvious osteomyelitis. He does have complete calcification of his tibial vessels on the CT scan  ASSESSMENT/PLAN: Very complex situation. Discussed this at length with the patient. Regarding his right below-knee amputation, the only option would be extensive revision of this with resection of more tibia and fibula and attempted salvage of the below-knee amputation with very short below-knee amputation stump. Explained that with his level of purulence that this would be unlikely. Also explain the option of right above-knee amputation. Explained that this would have  much higher chance for healing but would change his prosthetic requirements. He reports that he artery has a $9000 prosthesis sitting at home. I explained that even if we were fortunate enough to allow eventual healing of revision of his below-knee amputation that he would have to have probably complete refitting since this would be a very short below-knee prosthesis.  Regarding his left foot. He does have palpable popliteal pulse so therefore has severe tibial disease. Would recommend arteriography for further evaluation to determine if any treatment is possible. Doubt that he will be unreconstructable and suspect that he has distal tibial disease. Explained that he is certainly at risk for limb loss on the left as well related to his distal disease.  The patient is on Coumadin for unknown indication. INR is 3.4 today. Will not be able to do arteriography or vision of his amputation until this is below 2.0. Will defer to medical team. Will hold Coumadin. Would consider reversing Coumadin if felt to be safe from a medical standpoint. Will follow with you   Clinton Gallant [facility] 12/31/2015 11:29 AM I have examined the patient, reviewed and agree with above.  Gretta Began, MD 12/31/2015 12:34 PM
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
## 2015-12-31 NOTE — Progress Notes (Signed)
Pharmacy Antibiotic Note  Ronald Simpson is a 70 y.o. male admitted on 12/26/2015 with sepsis related to a wound infection. Pharmacy has been consulted for vancomycin and Zosyn dosing. Patient was positive for enteropathogenic e coli as well on his GI panel.    Wound culture growing MRSA. Vancomycin trough is 20 and was drawn 1 hr after the due time however the dose due was charted before trough drawn. Spoke with Clydie BraunKaren, RN who contacted the day shift RN - although dose was charted as given at 17:45 it was not hung until 19:15. Would expect trough then to be slightly higher. Renal function improved from admit but need to watch closely.  Plan: Zosyn 3.375 g IV every 8 hrs (infused over 4 hrs) - consider discontinuing this now that organism identified Decrease vancomycin to 750 mg IV q12h Monitor cultures, LOT, renal fxn, vancomycin trough as appropriate  Height: 5\' 10"  (177.8 cm) Weight: 159 lb 11.2 oz (72.4 kg) IBW/kg (Calculated) : 73  Temp (24hrs), Avg:99.2 F (37.3 C), Min:99 F (37.2 C), Max:99.4 F (37.4 C)   Recent Labs Lab 12/26/15 1908 12/26/15 1929 12/27/15 0631 12/28/15 0848 12/29/15 0530 12/31/15 0824 12/31/15 1835  WBC 26.1*  --  24.5* 18.3* 17.0* 15.3*  --   CREATININE 2.04*  --  1.66* 1.13 1.04 1.20  --   LATICACIDVEN  --  1.54  --   --   --   --   --   VANCOTROUGH  --   --   --   --   --   --  20    Estimated Creatinine Clearance: 60.3 mL/min (by C-G formula based on SCr of 1.2 mg/dL).    No Known Allergies  Antimicrobials this admission: Vanc 9/13 X 1, 9/16>> Zosyn 9/13 x 1, 9/16>> Ceftriaxone 9/14>9/16  Levels/Adjustments: 9/18 VT 20 on  (drawn 1 hr late)  Microbiology results: 9/13 BCx: NGTD 9/13 UCx: 30,000 candida albicans 9/15 C-diff neg 9/15- GI panel showed enteropathic ecoli 9/16 Wound culture: MRSA  Thank you for allowing pharmacy to be a part of this patient's care.  Loura BackJennifer Plush, PharmD, BCPS Clinical Pharmacist Phone for today [phone]  [phone] Main pharmacy - [phone] 12/31/2015 8:22 PM

## 2015-12-31 NOTE — Progress Notes (Signed)
ANTICOAGULATION CONSULT NOTE - Follow Up Consult  Pharmacy Consult for Coumadin Indication: history of stroke  No Known Allergies  Patient Measurements: Height: 5\' 10"  (177.8 cm) Weight: 159 lb 11.2 oz (72.4 kg) IBW/kg (Calculated) : 73  Vital Signs: Temp: 99 F (37.2 C) (09/18 0559) Temp Source: Oral (09/18 0559) BP: 137/87 (09/18 0848) Pulse Rate: 82 (09/18 0848)  Labs:  Recent Labs  12/29/15 0530 12/30/15 0626 12/31/15 0824  HGB 10.8*  --  9.8*  HCT 33.7*  --  30.0*  PLT 295  --  290  LABPROT 32.7* 30.8* 35.2*  INR 3.11 2.88 3.41  CREATININE 1.04  --  1.20    Estimated Creatinine Clearance: 60.3 mL/min (by C-G formula based on SCr of 1.2 mg/dL).   Medications:  Prescriptions Prior to Admission  Medication Sig Dispense Refill Last Dose  . amitriptyline (ELAVIL) 25 MG tablet Take 25 mg by mouth at bedtime.   Past Week at Unknown time  . amLODipine (NORVASC) 10 MG tablet Take 10 mg by mouth daily.   12/25/2015 at Unknown time  . atorvastatin (LIPITOR) 40 MG tablet Take 40 mg by mouth daily.   Past Week at Unknown time  . carvedilol (COREG) 6.25 MG tablet Take 6.25 mg by mouth 2 (two) times daily with a meal.   12/25/2015 at 0730  . ferrous sulfate 325 (65 FE) MG tablet Take 325 mg by mouth 2 (two) times daily with a meal.   12/25/2015 at Unknown time  . furosemide (LASIX) 20 MG tablet Take 20 mg by mouth daily.   Past Week at Unknown time  . hydrALAZINE (APRESOLINE) 25 MG tablet Take 25 mg by mouth 3 (three) times daily.   12/25/2015 at Unknown time  . insulin glargine (LANTUS) 100 unit/mL SOPN Inject 17 Units into the skin at bedtime.   12/24/2015  . levothyroxine (SYNTHROID, LEVOTHROID) 25 MCG tablet Take 25 mcg by mouth daily before breakfast.   12/25/2015 at Unknown time  . lisinopril (PRINIVIL,ZESTRIL) 10 MG tablet Take 10 mg by mouth daily.   12/25/2015 at Unknown time  . nitroGLYCERIN (NITROSTAT) 0.4 MG SL tablet Place 0.4 mg under the tongue every 5 (five) minutes  as needed for chest pain.   never  . oxyCODONE (OXY IR/ROXICODONE) 5 MG immediate release tablet Take 5 mg by mouth every 4 (four) hours as needed for severe pain.   Past Month at Unknown time  . pregabalin (LYRICA) 50 MG capsule Take 100-150 mg by mouth 3 (three) times daily. Take 100mg  every morning and 150mg  at 1400 and bedtime   12/25/2015 at Unknown time  . spironolactone (ALDACTONE) 25 MG tablet Take 25 mg by mouth daily.   Past Week at Unknown time  . warfarin (COUMADIN) 3 MG tablet Take 7 mg by mouth daily. Takes along with a 4mg  tablet   12/24/2015   Scheduled:  . amitriptyline  25 mg Oral QHS  . amLODipine  10 mg Oral Daily  . atorvastatin  40 mg Oral Daily  . carvedilol  6.25 mg Oral BID WC  . ferrous sulfate  325 mg Oral BID WC  . hydrALAZINE  25 mg Oral TID  . insulin aspart  0-15 Units Subcutaneous TID WC  . insulin aspart  0-5 Units Subcutaneous QHS  . insulin glargine  17 Units Subcutaneous QHS  . levothyroxine  25 mcg Oral QAC breakfast  . piperacillin-tazobactam (ZOSYN)  IV  3.375 g Intravenous Q8H  . pregabalin  100 mg Oral Daily  And  . pregabalin  150 mg Oral BID  . vancomycin  1,000 mg Intravenous Q12H  . Warfarin - Pharmacist Dosing Inpatient   Does not apply q1800    Assessment: 70 YO male admitted for sepsis. Pharmacy consulted to continue Coumadin for h/o CVA. Coumadin held at admission until 9/15 due to supratherapeutic INR. Patient was given Coumadin 3.5 mg PO on 9/16 and 9/17. Today INR is again supratherapeutic at 3.41. CBC is stable, no signs/symptoms of bleeding noted.  PTA dose: 7 mg daily  Goal of Therapy:  INR 2-3   Plan:  Hold Coumadin dose tonight Daily INR/CBC F/U management  Glyn Gerads, Student-PharmD 12/31/2015,11:10 AM

## 2015-12-31 NOTE — Progress Notes (Signed)
PT Cancellation Note  Patient Details Name: Ronald Simpson MRN: [id number] DOB: [birth year]   Cancelled Treatment:    Reason Eval/Treat Not Completed: Patient declined, no reason specified. Pt continues to refuse PT intervention, only stating that he is just not up to it.  Eval has been attempted x 3 days. PT signing off. Please re-consult, if appropriate.   Ilda FoilGarrow, Bostyn Kunkler Rene 12/31/2015, 11:04 AM

## 2016-01-01 ENCOUNTER — Encounter: Payer: Self-pay | Admitting: Vascular Surgery

## 2016-01-01 LAB — CBC
HCT: 27.7 % — ABNORMAL LOW (ref 39.0–52.0)
Hemoglobin: 8.9 g/dL — ABNORMAL LOW (ref 13.0–17.0)
MCH: 28.8 pg (ref 26.0–34.0)
MCHC: 32.1 g/dL (ref 30.0–36.0)
MCV: 89.6 fL (ref 78.0–100.0)
PLATELETS: 274 10*3/uL (ref 150–400)
RBC: 3.09 MIL/uL — ABNORMAL LOW (ref 4.22–5.81)
RDW: 13.2 % (ref 11.5–15.5)
WBC: 13.4 10*3/uL — ABNORMAL HIGH (ref 4.0–10.5)

## 2016-01-01 LAB — GLUCOSE, CAPILLARY
Glucose-Capillary: 126 mg/dL — ABNORMAL HIGH (ref 65–99)
Glucose-Capillary: 153 mg/dL — ABNORMAL HIGH (ref 65–99)
Glucose-Capillary: 179 mg/dL — ABNORMAL HIGH (ref 65–99)
Glucose-Capillary: 163 mg/dL — ABNORMAL HIGH (ref 65–99)

## 2016-01-01 LAB — PROTIME-INR
INR: 2.84
PROTHROMBIN TIME: 30.4 s — AB (ref 11.4–15.2)

## 2016-01-01 MED ORDER — VITAMIN K1 10 MG/ML IJ SOLN
5.0000 mg | Freq: Once | INTRAVENOUS | Status: AC
  Administered 2016-01-01: 5 mg via INTRAVENOUS
  Filled 2016-01-01: qty 0.5

## 2016-01-01 NOTE — Care Management Important Message (Signed)
Important Message  Patient Details  Name: Ronald Simpson MRN: [id number] Date of Birth: 1945-06-30   Medicare Important Message Given:  Yes    Lawerance Sabalebbie Laurynn Mccorvey, RN 01/01/2016, 12:36 PM

## 2016-01-01 NOTE — Progress Notes (Signed)
[facility]  Subjective  - No change.   Objective (!) 167/85 85 98.4 F (36.9 C) (Oral) 18 94%  Intake/Output Summary (Last 24 hours) at 01/01/16 0739 Last data filed at 01/01/16 0538  Gross per 24 hour  Intake              222 ml  Output              600 ml  Net             -378 ml    Right BKA infected anterior wound with purulent drainage Left second and fourth toes with Dry gangrene.  Doppler PT/DP   Assessment/Planning: BKA infection Left LE dry gangrene second and fourth toes  Pending INR below 2.0 he needs revision of the right BKA to AKA and angiogram of the left LE. INR today is 2.84  Ronald GallantCOLLINS, Ronald Simpson [facility] 01/01/2016 7:39 AM --  Laboratory Lab Results:  Recent Labs  12/31/15 0824 01/01/16 0411  WBC 15.3* 13.4*  HGB 9.8* 8.9*  HCT 30.0* 27.7*  PLT 290 274   BMET  Recent Labs  12/31/15 0824  NA 130*  K 4.0  CL 104  CO2 20*  GLUCOSE 165*  BUN 14  CREATININE 1.20  CALCIUM 8.1*    COAG Lab Results  Component Value Date   INR 2.84 01/01/2016   INR 3.41 12/31/2015   INR 2.88 12/30/2015   No results found for: PTT

## 2016-01-01 NOTE — Progress Notes (Addendum)
[facility]  PROGRESS NOTE  Ronald Simpson ZOX:[id number] DOB: 1945/10/10 DOA: 12/26/2015 PCP: [name], MD   Brief HPI:  70 y.o. with a history of CAD status post CABG in 2016, history of CVA, diabetes mellitus type 2, hepatitis C, chronic anticoagulation for unknown reasons. He presented with symptoms of malaise for 4 days. He was found to have sepsis, possibly from the left foot vs diarrhea.  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 [facility] consulted, may need revision of BKA and arteriogram of L leg  Subjective   Feels ok, wondering when he will have the dye test   Assessment/Plan:   1. Sepsis- likely from cellulitis of  L foot, he has purulence at R BKA stump and gangrenous changes in 2 toes of L foot       -CT with Subcutaneous edema around ankle tracking in the dorsum of the foot and along the ball of the foot.       -ABI inconclusive due to non compressible vessels       -previous BKA in [facility] in May       -Wound Cx from L foot wound: polymicrobial including Staph       -on IV Vancomycin and Zosyn-Day 6       -family/Neice reports that he was just referred to [facility],       -Arterial Duplex from [facility] on 9/7 in [facility]: notes hemodynamically significant stenosis in L superficial fem artery/anterior tibial and proximal R SFA      -appreciate [facility]VS consult, Arteriogram planned for LLE when INR <2 and may need revision of R BKA      -held warfarin, will give Vitamin K x1 today  1. EPEC  Diarrhea     -diarrhea improved,  GI pathogen panel showed enteropathogenic Escherichia coli.      -supportive care  3.   History of CAD- stable, continue Coreg  2. Hypertension- -stable  3. Hypothyroidism-continue Synthroid  4. History of  hepatitis C - Cirrhotic appearance of liver on CT and albumin is 1.6  5. Diabetes mellitus- - continue Lantus and sliding scale insulin with NovoLog. Blood glucose moderately controlled.  7.   Paroxysmal Afib - On anticoagulation with warfarin --pt and family unaware why he was on warfarin, I was able to contact his PCP (Dr.Schneider) who was able to review his Cardiologists note from EMR from 2015 which notes P.Afib as reason for anticoagulation -warfarin on hold for Arteriogram when INR <2 -INR 2.8 today, will give Vitamin K x1 now  DVT prophylaxis: Patient on warfarin  Code Status: Full code  Family Communication: No family at bedside, called and d/w niece [name]  Disposition Plan: pending Surgical eval   Consultants:  [facility]   Procedures:  None  Antibiotics:   Anti-infectives    Start     Dose/Rate Route Frequency Ordered Stop   01/01/16 0600  vancomycin (VANCOCIN) IVPB 750 mg/150 ml premix     750 mg 150 mL/hr over 60 Minutes Intravenous Every 12 hours 12/31/15 2047     12/30/15 0600  vancomycin (VANCOCIN) IVPB 1000 mg/200 mL premix  Status:  Discontinued     1,000 mg 200 mL/hr over 60 Minutes Intravenous Every 12 hours 12/29/15 1505 12/31/15 2047   12/29/15 1800  vancomycin (VANCOCIN) IVPB 1000 mg/200 mL premix     1,000 mg 200 mL/hr over 60 Minutes Intravenous  Once 12/29/15 1505 12/29/15 1834   12/29/15 1600  piperacillin-tazobactam (ZOSYN) IVPB 3.375 g     3.375 g 12.5 mL/hr over 240 Minutes Intravenous Every 8 hours 12/29/15 1505     12/27/15 2200  vancomycin (VANCOCIN) IVPB 1000 mg/200 mL premix  Status:  Discontinued     1,000 mg 200 mL/hr over 60 Minutes Intravenous Every 24 hours 12/27/15 0146 12/27/15 0339   12/27/15 1000  vancomycin (VANCOCIN) 50 mg/mL oral solution 125 mg  Status:  Discontinued     125 mg Oral 4 times daily 12/27/15 0053 12/27/15 0327   12/27/15 0600  piperacillin-tazobactam (ZOSYN) IVPB 3.375 g  Status:  Discontinued     3.375 g 12.5  mL/hr over 240 Minutes Intravenous Every 8 hours 12/27/15 0146 12/27/15 0339   12/27/15 0600  cefTRIAXone (ROCEPHIN) 2 g in dextrose 5 % 50 mL IVPB  Status:  Discontinued     2 g 100 mL/hr over 30 Minutes Intravenous Every 24 hours 12/27/15 0339 12/28/15 1830   12/26/15 2330  piperacillin-tazobactam (ZOSYN) IVPB 3.375 g     3.375 g 100 mL/hr over 30 Minutes Intravenous  Once 12/26/15 2318 12/27/15 0030   12/26/15 2100  vancomycin (VANCOCIN) IVPB 1000 mg/200 mL premix     1,000 mg 200 mL/hr over 60 Minutes Intravenous  Once 12/26/15 2046 12/26/15 2348       Objective   Vitals:   12/31/15 1400 12/31/15 1744 12/31/15 2132 01/01/16 0540  BP: (!) 152/77 (!) 140/91 134/72 (!) 167/85  Pulse: 94 91 77 85  Resp: 20  18 18   Temp: 99.4 F (37.4 C)  99.7 F (37.6 C) 98.4 F (36.9 C)  TempSrc: Oral  Oral Oral  SpO2: 98%  99% 94%  Weight:      Height:        Intake/Output Summary (Last 24 hours) at 01/01/16 1308 Last data filed at 01/01/16 1000  Gross per 24 hour  Intake                0 ml  Output              975 ml  Net             -975 ml   Filed Weights   12/27/15 0136  Weight: 72.4 kg (159 lb 11.2 oz)     Physical Examination:  General exam: Appears calm and comfortable, AAOx3 Respiratory system: Clear to auscultation. Respiratory effort normal. Cardiovascular system:  RRR. No  murmurs, rubs, gallops. No pedal edema. GI system: Abdomen is nondistended, soft and nontender. No organomegaly.  Central nervous system. No focal neurological deficits. Musculoskeletal- status post right below-knee amputation Skin: Dry gangrene noted on the medial aspect of the second and fourth toe, pus noted was in from black eschar on deep palpation. Tender to palpation R BKA noted and ulcer at stump with purulence Psychiatry: Alert, oriented x 3.Judgement and insight appear normal. Affect normal.    Data Reviewed: I have personally reviewed following labs and imaging  studies  CBG:  Recent Labs Lab 12/31/15 0803 12/31/15 1146 12/31/15 1716 12/31/15 2130 01/01/16 0816  GLUCAP 150* 160* 213* 146* 126*    CBC:  Recent Labs Lab 12/26/15 1908 12/27/15 0631 12/28/15 0848 12/29/15 0530 12/31/15 0824 01/01/16 0411  WBC 26.1* 24.5* 18.3* 17.0* 15.3* 13.4*  NEUTROABS 23.2*  --   --   --   --   --  HGB 12.2* 11.4* 10.0* 10.8* 9.8* 8.9*  HCT 36.4* 34.7* 30.9* 33.7* 30.0* 27.7*  MCV 89.4 90.8 90.6 92.1 89.6 89.6  PLT 337 354 321 295 290 274    Basic Metabolic Panel:  Recent Labs Lab 12/26/15 1908 12/27/15 0631 12/28/15 0848 12/29/15 0530 12/31/15 0824  NA 134* 133* 134* 137 130*  K 4.9 4.3 4.2 4.6 4.0  CL 104 104 109 111 104  CO2 20* 21* 20* 18* 20*  GLUCOSE 452* 331* 149* 183* 165*  BUN 55* 50* 36* 26* 14  CREATININE 2.04* 1.66* 1.13 1.04 1.20  CALCIUM 9.2 8.8* 8.6* 8.6* 8.1*    Recent Results (from the past 240 hour(s))  Culture, blood (Routine x 2)     Status: None   Collection Time: 12/26/15  7:00 PM  Result Value Ref Range Status   Specimen Description BLOOD RIGHT FOREARM  Final   Special Requests BOTTLES DRAWN AEROBIC AND ANAEROBIC 5CC  Final   Culture NO GROWTH 5 DAYS  Final   Report Status 12/31/2015 FINAL  Final  Culture, blood (Routine x 2)     Status: None   Collection Time: 12/26/15  7:08 PM  Result Value Ref Range Status   Specimen Description BLOOD LEFT FOREARM  Final   Special Requests BOTTLES DRAWN AEROBIC AND ANAEROBIC 5CC  Final   Culture NO GROWTH 5 DAYS  Final   Report Status 12/31/2015 FINAL  Final  Urine culture     Status: Abnormal   Collection Time: 12/26/15  7:42 PM  Result Value Ref Range Status   Specimen Description URINE, CATHETERIZED  Final   Special Requests NONE  Final   Culture 30,000 COLONIES/mL CANDIDA ALBICANS (A)  Final   Report Status 12/28/2015 FINAL  Final  C difficile quick scan w PCR reflex     Status: None   Collection Time: 12/27/15  1:05 AM  Result Value Ref Range Status    C Diff antigen NEGATIVE NEGATIVE Final   C Diff toxin NEGATIVE NEGATIVE Final   C Diff interpretation No C. difficile detected.  Final  Gastrointestinal Panel by PCR , Stool     Status: Abnormal   Collection Time: 12/27/15  1:05 AM  Result Value Ref Range Status   Campylobacter species NOT DETECTED NOT DETECTED Final   Plesimonas shigelloides NOT DETECTED NOT DETECTED Final   Salmonella species NOT DETECTED NOT DETECTED Final   Yersinia enterocolitica NOT DETECTED NOT DETECTED Final   Vibrio species NOT DETECTED NOT DETECTED Final   Vibrio cholerae NOT DETECTED NOT DETECTED Final   Enteroaggregative E coli (EAEC) NOT DETECTED NOT DETECTED Final   Enteropathogenic E coli (EPEC) DETECTED (A) NOT DETECTED Final    Comment: CRITICAL RESULT CALLED TO, READ BACK BY AND VERIFIED WITH: Ginger Gleeson @ 1354 12/28/15 [facility]    Enterotoxigenic E coli (ETEC) NOT DETECTED NOT DETECTED Final   Shiga like toxin producing E coli (STEC) NOT DETECTED NOT DETECTED Final   Shigella/Enteroinvasive E coli (EIEC) NOT DETECTED NOT DETECTED Final   Cryptosporidium NOT DETECTED NOT DETECTED Final   Cyclospora cayetanensis NOT DETECTED NOT DETECTED Final   Entamoeba histolytica NOT DETECTED NOT DETECTED Final   Giardia lamblia NOT DETECTED NOT DETECTED Final   Adenovirus F40/41 NOT DETECTED NOT DETECTED Final   Astrovirus NOT DETECTED NOT DETECTED Final   Norovirus GI/GII NOT DETECTED NOT DETECTED Final   Rotavirus A NOT DETECTED NOT DETECTED Final   Sapovirus (I, II, IV, and V) NOT DETECTED NOT DETECTED  Final  Aerobic Culture (superficial specimen)     Status: None   Collection Time: 12/29/15 10:57 AM  Result Value Ref Range Status   Specimen Description WOUND FOOT LEFT  Final   Special Requests Normal  Final   Gram Stain   Final    ABUNDANT WBC PRESENT, PREDOMINANTLY PMN RARE SQUAMOUS EPITHELIAL CELLS PRESENT ABUNDANT GRAM POSITIVE COCCI IN PAIRS IN CLUSTERS FEW GRAM POSITIVE RODS RARE GRAM NEGATIVE  RODS    Culture   Final    ABUNDANT METHICILLIN RESISTANT STAPHYLOCOCCUS AUREUS MODERATE SERRATIA MARCESCENS    Report Status 12/31/2015 FINAL  Final   Organism ID, Bacteria METHICILLIN RESISTANT STAPHYLOCOCCUS AUREUS  Final   Organism ID, Bacteria SERRATIA MARCESCENS  Final      Susceptibility   Methicillin resistant staphylococcus aureus - MIC*    CIPROFLOXACIN >=8 RESISTANT Resistant     ERYTHROMYCIN >=8 RESISTANT Resistant     GENTAMICIN <=0.5 SENSITIVE Sensitive     OXACILLIN >=4 RESISTANT Resistant     TETRACYCLINE 2 SENSITIVE Sensitive     VANCOMYCIN <=0.5 SENSITIVE Sensitive     TRIMETH/SULFA <=10 SENSITIVE Sensitive     CLINDAMYCIN <=0.25 SENSITIVE Sensitive     RIFAMPIN <=0.5 SENSITIVE Sensitive     Inducible Clindamycin NEGATIVE Sensitive     * ABUNDANT METHICILLIN RESISTANT STAPHYLOCOCCUS AUREUS   Serratia marcescens - MIC*    CEFAZOLIN >=64 RESISTANT Resistant     CEFEPIME <=1 SENSITIVE Sensitive     CEFTAZIDIME <=1 SENSITIVE Sensitive     CEFTRIAXONE <=1 SENSITIVE Sensitive     CIPROFLOXACIN <=0.25 SENSITIVE Sensitive     GENTAMICIN <=1 SENSITIVE Sensitive     TRIMETH/SULFA <=20 SENSITIVE Sensitive     * MODERATE SERRATIA MARCESCENS     Liver Function Tests:  Recent Labs Lab 12/26/15 1908 12/27/15 0631 12/29/15 0530 12/31/15 0824  AST 29 51* 222* 50*  ALT 21 26 91* 50  ALKPHOS 143* 139* 167* 176*  BILITOT 1.0 1.1 0.5 0.5  PROT 8.1 8.0 8.0 6.3*  ALBUMIN 2.2* 2.0* 1.6* 1.5*   No results for input(s): LIPASE, AMYLASE in the last 168 hours. No results for input(s): AMMONIA in the last 168 hours.  Cardiac Enzymes: No results for input(s): CKTOTAL, CKMB, CKMBINDEX, TROPONINI in the last 168 hours. BNP (last 3 results) No results for input(s): BNP in the last 8760 hours.  ProBNP (last 3 results) No results for input(s): PROBNP in the last 8760 hours.    Studies: No results found.  Scheduled Meds: . amitriptyline  25 mg Oral QHS  . amLODipine   10 mg Oral Daily  . atorvastatin  40 mg Oral Daily  . carvedilol  6.25 mg Oral BID WC  . ferrous sulfate  325 mg Oral BID WC  . hydrALAZINE  25 mg Oral TID  . insulin aspart  0-15 Units Subcutaneous TID WC  . insulin aspart  0-5 Units Subcutaneous QHS  . insulin glargine  17 Units Subcutaneous QHS  . levothyroxine  25 mcg Oral QAC breakfast  . piperacillin-tazobactam (ZOSYN)  IV  3.375 g Intravenous Q8H  . pregabalin  100 mg Oral Daily   And  . pregabalin  150 mg Oral BID  . vancomycin  750 mg Intravenous Q12H    Continuous Infusions: . sodium chloride 75 mL/hr at 12/31/15 1412    Time spent: 25 min  Tilia Faso   [facility] Pager [phone] If 7PM-7AM, please contact night-coverage at [email or web address], Office  [phone]  password [email or web address] 01/01/2016, 1:08 PM  LOS: 6 days

## 2016-01-02 LAB — BASIC METABOLIC PANEL
Anion gap: 5 (ref 5–15)
BUN: 16 mg/dL (ref 6–20)
CHLORIDE: 103 mmol/L (ref 101–111)
CO2: 19 mmol/L — ABNORMAL LOW (ref 22–32)
Calcium: 8.1 mg/dL — ABNORMAL LOW (ref 8.9–10.3)
Creatinine, Ser: 1.27 mg/dL — ABNORMAL HIGH (ref 0.61–1.24)
GFR, EST NON AFRICAN AMERICAN: 56 mL/min — AB (ref >60)
Glucose, Bld: 156 mg/dL — ABNORMAL HIGH (ref 65–99)
POTASSIUM: 4.2 mmol/L (ref 3.5–5.1)
SODIUM: 127 mmol/L — AB (ref 135–145)

## 2016-01-02 LAB — CBC
HCT: 28.1 % — ABNORMAL LOW (ref 39.0–52.0)
HEMOGLOBIN: 9 g/dL — AB (ref 13.0–17.0)
MCH: 28.8 pg (ref 26.0–34.0)
MCHC: 32 g/dL (ref 30.0–36.0)
MCV: 90.1 fL (ref 78.0–100.0)
PLATELETS: 303 10*3/uL (ref 150–400)
RBC: 3.12 MIL/uL — AB (ref 4.22–5.81)
RDW: 13.2 % (ref 11.5–15.5)
WBC: 14.3 10*3/uL — ABNORMAL HIGH (ref 4.0–10.5)

## 2016-01-02 LAB — GLUCOSE, CAPILLARY
GLUCOSE-CAPILLARY: 203 mg/dL — AB (ref 65–99)
Glucose-Capillary: 174 mg/dL — ABNORMAL HIGH (ref 65–99)
Glucose-Capillary: 181 mg/dL — ABNORMAL HIGH (ref 65–99)
Glucose-Capillary: 154 mg/dL — ABNORMAL HIGH (ref 65–99)

## 2016-01-02 LAB — PROTIME-INR
INR: 1.46
PROTHROMBIN TIME: 17.8 s — AB (ref 11.4–15.2)

## 2016-01-02 MED ORDER — CEFUROXIME SODIUM 1.5 G IJ SOLR
1.5000 g | INTRAMUSCULAR | Status: AC
  Filled 2016-01-02: qty 1.5

## 2016-01-02 MED ORDER — SODIUM CHLORIDE 0.9 % IV SOLN: INTRAVENOUS | Status: DC

## 2016-01-02 NOTE — Progress Notes (Signed)
Patient ID: Ronald Simpson, male   DOB: 1945-04-18, 70 y.o.   MRN: [id number] Patient's INR is normalized to 1.4. He continues to have the extreme amount of purulence in his right low knee amputation and is quite tender.  His left foot is stable with dry gangrenous changes to the second and fourth toe.  Explained need for revision. I do not feel that he has any chance for salvage of the below-knee amputation explained this to the patient. Recommend above-knee amputation tomorrow. This will be scheduled at 7:30 in the morning. Patient understands. We will then proceed potentially on Friday for arteriography for evaluation of his left foot. Continues to have a left popliteal pulse is unlikely that he would have any treatment options for improved vascularization on the left

## 2016-01-02 NOTE — Progress Notes (Signed)
      INR is 1.46 today I will discuss plan for right BKA to AKA surgery and left LE angiogram with Ddr. Early today.  Briton Sellman [name] PA-C

## 2016-01-02 NOTE — Progress Notes (Addendum)
[facility]  PROGRESS NOTE  Fadel Clason ZOX:[id number] DOB: April 29, 1945 DOA: 12/26/2015 PCP: [name], MD   Brief HPI:  70 y.o. with a history of CAD status post CABG in 2016, history of CVA, diabetes mellitus type 2, hepatitis C, Pafib on warfarin. He presented with symptoms of malaise for 4 days. He was found to have sepsis, from the left foot.  Started on broad-spectrum antibiotics, ABI was nondiagnostic, GI pathogen panel was collected on admission due to initial diarrhea which came back positive for EPEC, diarrhea improved and resolved now. Left foot showed gangrenous changes in the second and fourth toe and subsequently she CT scan showed subcutaneous edema in left foot. 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 Vascular surgery was consulted were, it was felt that he may need revision of his R BKA due to wound with purulence near stump and arteriogram to evaluate his left leg -gangrenous toes   Subjective   Feels ok, wondering when he will have the dye test   Assessment/Plan:   1. Sepsis- likely from cellulitis of  L foot, he has purulence at R BKA stump and gangrenous changes in 2 toes of L foot       -CT with subcutaneous edema around ankle tracking in the dorsum of the foot and along the ball of the foot.       -ABI inconclusive due to non compressible vessels       -previous BKA in [facility] in May       -Wound Cx from L foot wound: polymicrobial including Staph       -on IV Vancomycin and Zosyn-Day 7       -family/Neice reports that he was just referred to [facility],       -Arterial Duplex from [facility] on 9/7 in Care [facility]: notes hemodynamically significant stenosis in L superficial fem artery/anterior tibial and proximal R SFA      -appreciate [facility]VS consult, Arteriogram planned for LLE when INR <2 and may need revision of R BKA      -held warfarin, given IV Vitamin K   1. EPEC   Diarrhea     -diarrhea improved,  GI pathogen panel showed enteropathogenic Escherichia coli.      -supportive care  3.   History of CAD- stable, continue Coreg  2. Hypertension- -stable  3. Hypothyroidism-continue Synthroid  4. History of hepatitis C - Cirrhotic appearance of liver on CT and albumin is 1.6  5. Diabetes mellitus- - continue Lantus and sliding scale insulin with NovoLog. Blood glucose moderately controlled.  7.   Paroxysmal Afib - On anticoagulation with warfarin --pt and family unaware why he was on warfarin, I was able to contact his PCP (Dr.Schneider) who was able to review his Cardiologists note from EMR from 2015 which notes P.Afib as reason for anticoagulation -warfarin on hold for Arteriogram when INR <2 -INR 2.8 today, will give Vitamin K x1 now  DVT prophylaxis: Patient on warfarin, now held  Code Status: Full code Family Communication: No family at bedside, called and d/w niece [name] 9/19 Disposition Plan: pending Surgical plans   Consultants:  [facility]   Procedures:  None  Antibiotics:   Anti-infectives    Start     Dose/Rate Route Frequency Ordered Stop   01/01/16 0600  vancomycin (VANCOCIN) IVPB 750 mg/150 ml premix     750 mg 150 mL/hr over 60 Minutes Intravenous Every  12 hours 12/31/15 2047     12/30/15 0600  vancomycin (VANCOCIN) IVPB 1000 mg/200 mL premix  Status:  Discontinued     1,000 mg 200 mL/hr over 60 Minutes Intravenous Every 12 hours 12/29/15 1505 12/31/15 2047   12/29/15 1800  vancomycin (VANCOCIN) IVPB 1000 mg/200 mL premix     1,000 mg 200 mL/hr over 60 Minutes Intravenous  Once 12/29/15 1505 12/29/15 1834   12/29/15 1600  piperacillin-tazobactam (ZOSYN) IVPB 3.375 g     3.375 g 12.5 mL/hr over 240 Minutes Intravenous Every 8 hours 12/29/15 1505     12/27/15 2200  vancomycin (VANCOCIN) IVPB 1000 mg/200 mL premix  Status:  Discontinued     1,000 mg 200 mL/hr over 60 Minutes Intravenous Every 24 hours 12/27/15 0146 12/27/15  0339   12/27/15 1000  vancomycin (VANCOCIN) 50 mg/mL oral solution 125 mg  Status:  Discontinued     125 mg Oral 4 times daily 12/27/15 0053 12/27/15 0327   12/27/15 0600  piperacillin-tazobactam (ZOSYN) IVPB 3.375 g  Status:  Discontinued     3.375 g 12.5 mL/hr over 240 Minutes Intravenous Every 8 hours 12/27/15 0146 12/27/15 0339   12/27/15 0600  cefTRIAXone (ROCEPHIN) 2 g in dextrose 5 % 50 mL IVPB  Status:  Discontinued     2 g 100 mL/hr over 30 Minutes Intravenous Every 24 hours 12/27/15 0339 12/28/15 1830   12/26/15 2330  piperacillin-tazobactam (ZOSYN) IVPB 3.375 g     3.375 g 100 mL/hr over 30 Minutes Intravenous  Once 12/26/15 2318 12/27/15 0030   12/26/15 2100  vancomycin (VANCOCIN) IVPB 1000 mg/200 mL premix     1,000 mg 200 mL/hr over 60 Minutes Intravenous  Once 12/26/15 2046 12/26/15 2348       Objective   Vitals:   01/01/16 1540 01/01/16 2318 01/02/16 0736 01/02/16 1200  BP: 103/73 136/74 137/90 (!) 142/61  Pulse: 82 81 82 83  Resp: 18 18 20 18   Temp: 99.5 F (37.5 C) 99.5 F (37.5 C) 98 F (36.7 C) 100 F (37.8 C)  TempSrc: Oral Oral  Oral  SpO2: 98% 98% 100% 98%  Weight:      Height:        Intake/Output Summary (Last 24 hours) at 01/02/16 1515 Last data filed at 01/02/16 1200  Gross per 24 hour  Intake             1600 ml  Output              550 ml  Net             1050 ml   Filed Weights   12/27/15 0136  Weight: 72.4 kg (159 lb 11.2 oz)     Physical Examination:  General exam: Appears calm and comfortable, AAOx2, no distress, chronically ill Respiratory system: Clear to auscultation. Respiratory effort normal. Cardiovascular system:  RRR. No  murmurs, rubs, gallops. No pedal edema. GI system: Abdomen is nondistended, soft and nontender. No organomegaly.  Central nervous system. No focal neurological deficits. Musculoskeletal- status post right below-knee amputation Skin: Dry gangrene noted on the medial aspect of the second and fourth toe,  pus noted was in from black eschar on deep palpation. Tender to palpation R BKA noted and ulcer at stump with purulence Psychiatry: Alert, oriented x 3.Judgement and insight appear normal. Affect normal.    Data Reviewed: I have personally reviewed following labs and imaging studies  CBG:  Recent Labs Lab 01/01/16 1225 01/01/16 1713 01/01/16  2108 01/02/16 0825 01/02/16 1202  GLUCAP 153* 179* 163* 174* 181*    CBC:  Recent Labs Lab 12/26/15 1908  12/28/15 0848 12/29/15 0530 12/31/15 0824 01/01/16 0411 01/02/16 0610  WBC 26.1*  < > 18.3* 17.0* 15.3* 13.4* 14.3*  NEUTROABS 23.2*  --   --   --   --   --   --   HGB 12.2*  < > 10.0* 10.8* 9.8* 8.9* 9.0*  HCT 36.4*  < > 30.9* 33.7* 30.0* 27.7* 28.1*  MCV 89.4  < > 90.6 92.1 89.6 89.6 90.1  PLT 337  < > 321 295 290 274 303  < > = values in this interval not displayed.  Basic Metabolic Panel:  Recent Labs Lab 12/27/15 0631 12/28/15 0848 12/29/15 0530 12/31/15 0824 01/02/16 0610  NA 133* 134* 137 130* 127*  K 4.3 4.2 4.6 4.0 4.2  CL 104 109 111 104 103  CO2 21* 20* 18* 20* 19*  GLUCOSE 331* 149* 183* 165* 156*  BUN 50* 36* 26* 14 16  CREATININE 1.66* 1.13 1.04 1.20 1.27*  CALCIUM 8.8* 8.6* 8.6* 8.1* 8.1*    Recent Results (from the past 240 hour(s))  Culture, blood (Routine x 2)     Status: None   Collection Time: 12/26/15  7:00 PM  Result Value Ref Range Status   Specimen Description BLOOD RIGHT FOREARM  Final   Special Requests BOTTLES DRAWN AEROBIC AND ANAEROBIC 5CC  Final   Culture NO GROWTH 5 DAYS  Final   Report Status 12/31/2015 FINAL  Final  Culture, blood (Routine x 2)     Status: None   Collection Time: 12/26/15  7:08 PM  Result Value Ref Range Status   Specimen Description BLOOD LEFT FOREARM  Final   Special Requests BOTTLES DRAWN AEROBIC AND ANAEROBIC 5CC  Final   Culture NO GROWTH 5 DAYS  Final   Report Status 12/31/2015 FINAL  Final  Urine culture     Status: Abnormal   Collection Time:  12/26/15  7:42 PM  Result Value Ref Range Status   Specimen Description URINE, CATHETERIZED  Final   Special Requests NONE  Final   Culture 30,000 COLONIES/mL CANDIDA ALBICANS (A)  Final   Report Status 12/28/2015 FINAL  Final  C difficile quick scan w PCR reflex     Status: None   Collection Time: 12/27/15  1:05 AM  Result Value Ref Range Status   C Diff antigen NEGATIVE NEGATIVE Final   C Diff toxin NEGATIVE NEGATIVE Final   C Diff interpretation No C. difficile detected.  Final  Gastrointestinal Panel by PCR , Stool     Status: Abnormal   Collection Time: 12/27/15  1:05 AM  Result Value Ref Range Status   Campylobacter species NOT DETECTED NOT DETECTED Final   Plesimonas shigelloides NOT DETECTED NOT DETECTED Final   Salmonella species NOT DETECTED NOT DETECTED Final   Yersinia enterocolitica NOT DETECTED NOT DETECTED Final   Vibrio species NOT DETECTED NOT DETECTED Final   Vibrio cholerae NOT DETECTED NOT DETECTED Final   Enteroaggregative E coli (EAEC) NOT DETECTED NOT DETECTED Final   Enteropathogenic E coli (EPEC) DETECTED (A) NOT DETECTED Final    Comment: CRITICAL RESULT CALLED TO, READ BACK BY AND VERIFIED WITH: Ginger Gleeson @ 1354 12/28/15 [facility]    Enterotoxigenic E coli (ETEC) NOT DETECTED NOT DETECTED Final   Shiga like toxin producing E coli (STEC) NOT DETECTED NOT DETECTED Final   Shigella/Enteroinvasive E coli (EIEC) NOT  DETECTED NOT DETECTED Final   Cryptosporidium NOT DETECTED NOT DETECTED Final   Cyclospora cayetanensis NOT DETECTED NOT DETECTED Final   Entamoeba histolytica NOT DETECTED NOT DETECTED Final   Giardia lamblia NOT DETECTED NOT DETECTED Final   Adenovirus F40/41 NOT DETECTED NOT DETECTED Final   Astrovirus NOT DETECTED NOT DETECTED Final   Norovirus GI/GII NOT DETECTED NOT DETECTED Final   Rotavirus A NOT DETECTED NOT DETECTED Final   Sapovirus (I, II, IV, and V) NOT DETECTED NOT DETECTED Final  Aerobic Culture (superficial specimen)      Status: None   Collection Time: 12/29/15 10:57 AM  Result Value Ref Range Status   Specimen Description WOUND FOOT LEFT  Final   Special Requests Normal  Final   Gram Stain   Final    ABUNDANT WBC PRESENT, PREDOMINANTLY PMN RARE SQUAMOUS EPITHELIAL CELLS PRESENT ABUNDANT GRAM POSITIVE COCCI IN PAIRS IN CLUSTERS FEW GRAM POSITIVE RODS RARE GRAM NEGATIVE RODS    Culture   Final    ABUNDANT METHICILLIN RESISTANT STAPHYLOCOCCUS AUREUS MODERATE SERRATIA MARCESCENS    Report Status 12/31/2015 FINAL  Final   Organism ID, Bacteria METHICILLIN RESISTANT STAPHYLOCOCCUS AUREUS  Final   Organism ID, Bacteria SERRATIA MARCESCENS  Final      Susceptibility   Methicillin resistant staphylococcus aureus - MIC*    CIPROFLOXACIN >=8 RESISTANT Resistant     ERYTHROMYCIN >=8 RESISTANT Resistant     GENTAMICIN <=0.5 SENSITIVE Sensitive     OXACILLIN >=4 RESISTANT Resistant     TETRACYCLINE 2 SENSITIVE Sensitive     VANCOMYCIN <=0.5 SENSITIVE Sensitive     TRIMETH/SULFA <=10 SENSITIVE Sensitive     CLINDAMYCIN <=0.25 SENSITIVE Sensitive     RIFAMPIN <=0.5 SENSITIVE Sensitive     Inducible Clindamycin NEGATIVE Sensitive     * ABUNDANT METHICILLIN RESISTANT STAPHYLOCOCCUS AUREUS   Serratia marcescens - MIC*    CEFAZOLIN >=64 RESISTANT Resistant     CEFEPIME <=1 SENSITIVE Sensitive     CEFTAZIDIME <=1 SENSITIVE Sensitive     CEFTRIAXONE <=1 SENSITIVE Sensitive     CIPROFLOXACIN <=0.25 SENSITIVE Sensitive     GENTAMICIN <=1 SENSITIVE Sensitive     TRIMETH/SULFA <=20 SENSITIVE Sensitive     * MODERATE SERRATIA MARCESCENS     Liver Function Tests:  Recent Labs Lab 12/26/15 1908 12/27/15 0631 12/29/15 0530 12/31/15 0824  AST 29 51* 222* 50*  ALT 21 26 91* 50  ALKPHOS 143* 139* 167* 176*  BILITOT 1.0 1.1 0.5 0.5  PROT 8.1 8.0 8.0 6.3*  ALBUMIN 2.2* 2.0* 1.6* 1.5*   No results for input(s): LIPASE, AMYLASE in the last 168 hours. No results for input(s): AMMONIA in the last 168  hours.  Cardiac Enzymes: No results for input(s): CKTOTAL, CKMB, CKMBINDEX, TROPONINI in the last 168 hours. BNP (last 3 results) No results for input(s): BNP in the last 8760 hours.  ProBNP (last 3 results) No results for input(s): PROBNP in the last 8760 hours.    Studies: No results found.  Scheduled Meds: . amitriptyline  25 mg Oral QHS  . amLODipine  10 mg Oral Daily  . atorvastatin  40 mg Oral Daily  . carvedilol  6.25 mg Oral BID WC  . ferrous sulfate  325 mg Oral BID WC  . hydrALAZINE  25 mg Oral TID  . insulin aspart  0-15 Units Subcutaneous TID WC  . insulin aspart  0-5 Units Subcutaneous QHS  . insulin glargine  17 Units Subcutaneous QHS  . levothyroxine  25  mcg Oral QAC breakfast  . piperacillin-tazobactam (ZOSYN)  IV  3.375 g Intravenous Q8H  . pregabalin  100 mg Oral Daily   And  . pregabalin  150 mg Oral BID  . vancomycin  750 mg Intravenous Q12H    Continuous Infusions: . sodium chloride 75 mL/hr at 12/31/15 1412    Time spent: 25 min  Simuel Stebner   [facility] Pager [phone] If 7PM-7AM, please contact night-coverage at [email or web address], Office  [phone]  password [email or web address] 01/02/2016, 3:15 PM  LOS: 7 days

## 2016-01-03 ENCOUNTER — Encounter (HOSPITAL_COMMUNITY): Payer: Self-pay | Admitting: Anesthesiology

## 2016-01-03 ENCOUNTER — Inpatient Hospital Stay (HOSPITAL_COMMUNITY): Payer: Medicare Other | Admitting: Anesthesiology

## 2016-01-03 ENCOUNTER — Encounter (HOSPITAL_COMMUNITY)
Admission: EM | Disposition: A | Discharge status: Skilled Nursing Facility | Payer: Self-pay | Source: Home / Self Care | Attending: Internal Medicine

## 2016-01-03 DIAGNOSIS — B9562 Methicillin resistant Staphylococcus aureus infection as the cause of diseases classified elsewhere: Secondary | ICD-10-CM

## 2016-01-03 DIAGNOSIS — A488 Other specified bacterial diseases: Secondary | ICD-10-CM

## 2016-01-03 DIAGNOSIS — A498 Other bacterial infections of unspecified site: Secondary | ICD-10-CM

## 2016-01-03 DIAGNOSIS — I2510 Atherosclerotic heart disease of native coronary artery without angina pectoris: Secondary | ICD-10-CM

## 2016-01-03 DIAGNOSIS — L03116 Cellulitis of left lower limb: Secondary | ICD-10-CM

## 2016-01-03 DIAGNOSIS — E1165 Type 2 diabetes mellitus with hyperglycemia: Secondary | ICD-10-CM

## 2016-01-03 DIAGNOSIS — IMO0002 Reserved for concepts with insufficient information to code with codable children: Secondary | ICD-10-CM

## 2016-01-03 DIAGNOSIS — R197 Diarrhea, unspecified: Secondary | ICD-10-CM

## 2016-01-03 DIAGNOSIS — A419 Sepsis, unspecified organism: Secondary | ICD-10-CM

## 2016-01-03 DIAGNOSIS — J156 Pneumonia due to other aerobic Gram-negative bacteria: Secondary | ICD-10-CM

## 2016-01-03 DIAGNOSIS — E118 Type 2 diabetes mellitus with unspecified complications: Secondary | ICD-10-CM

## 2016-01-03 DIAGNOSIS — I480 Paroxysmal atrial fibrillation: Secondary | ICD-10-CM

## 2016-01-03 LAB — BASIC METABOLIC PANEL
Anion gap: 6 (ref 5–15)
BUN: 14 mg/dL (ref 6–20)
CHLORIDE: 104 mmol/L (ref 101–111)
CO2: 20 mmol/L — ABNORMAL LOW (ref 22–32)
CREATININE: 1.26 mg/dL — AB (ref 0.61–1.24)
Calcium: 8.2 mg/dL — ABNORMAL LOW (ref 8.9–10.3)
GFR calc Af Amer: >60 mL/min (ref >60)
GFR calc non Af Amer: 57 mL/min — ABNORMAL LOW (ref >60)
Glucose, Bld: 129 mg/dL — ABNORMAL HIGH (ref 65–99)
Potassium: 4.1 mmol/L (ref 3.5–5.1)
SODIUM: 130 mmol/L — AB (ref 135–145)

## 2016-01-03 LAB — CBC
HCT: 29 % — ABNORMAL LOW (ref 39.0–52.0)
HEMOGLOBIN: 9.2 g/dL — AB (ref 13.0–17.0)
MCH: 28.6 pg (ref 26.0–34.0)
MCHC: 31.7 g/dL (ref 30.0–36.0)
MCV: 90.1 fL (ref 78.0–100.0)
Platelets: 299 10*3/uL (ref 150–400)
RBC: 3.22 MIL/uL — ABNORMAL LOW (ref 4.22–5.81)
RDW: 13 % (ref 11.5–15.5)
WBC: 13.3 10*3/uL — ABNORMAL HIGH (ref 4.0–10.5)

## 2016-01-03 LAB — SURGICAL PCR SCREEN
MRSA, PCR: POSITIVE — AB
Staphylococcus aureus: POSITIVE — AB

## 2016-01-03 LAB — GLUCOSE, CAPILLARY
GLUCOSE-CAPILLARY: 146 mg/dL — AB (ref 65–99)
GLUCOSE-CAPILLARY: 167 mg/dL — AB (ref 65–99)
Glucose-Capillary: 122 mg/dL — ABNORMAL HIGH (ref 65–99)
Glucose-Capillary: 129 mg/dL — ABNORMAL HIGH (ref 65–99)
Glucose-Capillary: 163 mg/dL — ABNORMAL HIGH (ref 65–99)

## 2016-01-03 LAB — PROTIME-INR
INR: 1.4
PROTHROMBIN TIME: 17.3 s — AB (ref 11.4–15.2)

## 2016-01-03 LAB — VANCOMYCIN, TROUGH: VANCOMYCIN TR: 38 ug/mL — AB (ref 15–20)

## 2016-01-03 SURGERY — AMPUTATION, ABOVE KNEE
Anesthesia: General | Laterality: Right

## 2016-01-03 MED ORDER — FENTANYL CITRATE (PF) 100 MCG/2ML IJ SOLN
INTRAMUSCULAR | Status: AC
  Filled 2016-01-03: qty 2

## 2016-01-03 MED ORDER — ADULT MULTIVITAMIN W/MINERALS CH
1.0000 | ORAL_TABLET | Freq: Every day | ORAL | Status: DC
  Administered 2016-01-03 – 2016-01-14 (×10): 1 via ORAL
  Filled 2016-01-03 (×10): qty 1

## 2016-01-03 MED ORDER — MIDAZOLAM HCL 2 MG/2ML IJ SOLN
INTRAMUSCULAR | Status: AC
  Filled 2016-01-03: qty 2

## 2016-01-03 MED ORDER — GLUCERNA SHAKE PO LIQD
237.0000 mL | Freq: Three times a day (TID) | ORAL | Status: DC
  Administered 2016-01-03 – 2016-01-14 (×19): 237 mL via ORAL

## 2016-01-03 MED ORDER — MUPIROCIN 2 % EX OINT
1.0000 | TOPICAL_OINTMENT | Freq: Two times a day (BID) | CUTANEOUS | Status: AC
Start: 2016-01-03 — End: 2016-01-07
  Administered 2016-01-03 – 2016-01-07 (×10): 1 via NASAL
  Filled 2016-01-03 (×3): qty 22

## 2016-01-03 MED ORDER — PROPOFOL 10 MG/ML IV BOLUS
INTRAVENOUS | Status: AC
  Filled 2016-01-03: qty 40

## 2016-01-03 MED ORDER — HEPARIN SODIUM (PORCINE) 5000 UNIT/ML IJ SOLN
5000.0000 [IU] | Freq: Three times a day (TID) | INTRAMUSCULAR | Status: DC
  Administered 2016-01-03 – 2016-01-07 (×12): 5000 [IU] via SUBCUTANEOUS
  Filled 2016-01-03 (×10): qty 1

## 2016-01-03 MED ORDER — CHLORHEXIDINE GLUCONATE CLOTH 2 % EX PADS
6.0000 | MEDICATED_PAD | Freq: Every day | CUTANEOUS | Status: AC
  Administered 2016-01-04 – 2016-01-07 (×4): 6 via TOPICAL

## 2016-01-03 NOTE — Progress Notes (Signed)
Pt transported to OR via bed by [facility]ber[facility].

## 2016-01-03 NOTE — Progress Notes (Addendum)
Pharmacy Antibiotic Note  Ronald Simpson is a 70 y.o. male admitted on 12/26/2015 with sepsis related to a wound infection. Pharmacy has been consulted for vancomycin and Zosyn dosing. Patient was positive for enteropathogenic e coli as well on his GI panel.    Day #6 of broad spectrum abx for infectious gastroenteritis / infected left foot wound. Dry gangrene noted on left third toe. Plan is for R AKA but patient wants more time to think about the surgery. Vascular explained this is really the only option. Procedure delayed until 9/25 for now, but may send patient home with PICC line if he still refuses. Currently afebrile, WBC stable at 13.3. ARF improving overall, back up slightly at 1.26, CrCl ~2555ml/min.   Plan: Continue vancomycin 750 mg IV Q12 Continue Zosyn 3.375 gm IV q8h (4 hour infusion) Monitor clinical picture, renal function, VT today F/U abx deescalation / LOT  Height: 5\' 10"  (177.8 cm) Weight: 159 lb 11.2 oz (72.4 kg) IBW/kg (Calculated) : 73  Temp (24hrs), Avg:99.4 F (37.4 C), Min:98.9 F (37.2 C), Max:100 F (37.8 C)   Recent Labs Lab 12/28/15 0848 12/29/15 0530 12/31/15 0824 12/31/15 1835 01/01/16 0411 01/02/16 0610 01/03/16 0537  WBC 18.3* 17.0* 15.3*  --  13.4* 14.3* 13.3*  CREATININE 1.13 1.04 1.20  --   --  1.27* 1.26*  VANCOTROUGH  --   --   --  20  --   --   --     Estimated Creatinine Clearance: 57.5 mL/min (by C-G formula based on SCr of 1.26 mg/dL (H)).    Not on File  Antimicrobials this admission: Rocephin 9/14  >> 9/15 Vancomycin 9/13 x 1, 9/16 >> Zosyn 9/13 x 1, 9/16 >>  Levels/Adjustments: 9/18 VT 20 on  (drawn 1 hr late)  Microbiology results: 9/13 BCx: ngF 9/13 UCx: 30,000 candida albicans 9/15 C-diff neg 9/15- GI panel showed enteropathic ecoli 9/16 Wound culture: MRSA & serratia marcescens (R-ancef)  Thank you for allowing pharmacy to be a part of this patient's care.  Enzo BiNathan Mischele Detter, PharmD, BCPS Clinical  Pharmacist Pager [phone] 01/03/2016 8:53 AM

## 2016-01-03 NOTE — Progress Notes (Signed)
IV antibiotic Vanc hung by ChiropodistAssistant Director at [phone] who was unaware of vanc trough needing to be drawn. Pharmacy called RN and cancelled lab and retimed for tomorrow. Phlebotomy was unaware and drew lab at 1752 while vanc was infusing. Vanc trough resulted "critical high" at 38. Pharmacy contacted to make aware of level and no change in orders at this time.

## 2016-01-03 NOTE — Progress Notes (Signed)
PROGRESS NOTE    Ronald Simpson  UJW:[id number] DOB: 1946-01-04 DOA: 12/26/2015 PCP: [name], MD     Brief Narrative:  70 y.o. BM PMHx CAD S/P CABG in 2015, CVA, DM  Type 2 uncontrolled with complication, Hep C, on chronic anticoagulation for unknown reasons.   Who presents with malaise for four days.  Caveat that all history is collected from patient's daughter at the bedside as well as CareEverywhere, as patient appears disoriented.    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  He was briefly in rehab, but discharged about a week to two weeks ago.  Last Thursday, he was seen in the [facility] ER for pain in his stump (after falling on the stump) and pain in his left toes as well as color change/blackening of the toes.  The patient's daughter and mother state that he was asked to stay in the hospital but refused, although the chart in [facility]vere[facility]where states that the case was discussed with his surgeon Dr. Ivor Costa who plan to inform the patient's wound clinic in [facility] and coordinate him with vascular surgery.  Per the chart, the patient was discharged with several days of oxycodone for pain. Over the weekend the patient's mother notes that he was "just lying around", seemed tired, wouldn't eat anything, complained of vague abdominal discomfort and malaise, and just "wasn't himself". Today they called EMS, who found the patient with foul-smelling urine in the commode, and brought him to the ER.   Subjective: 9/21 A/O 4, NAD. 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 However feels that as long as he can squeeze the pus out of the stump it would heal on its own.     Assessment & Plan:   Principal Problem:   Sepsis (HCC) Active Problems:   Hepatitis C without hepatic coma   Coronary artery disease due to lipid rich plaque   Type 2 diabetes mellitus with diabetic peripheral angiopathy without  gangrene, without long-term current use of insulin (HCC)   History of CVA (cerebrovascular accident)   Genital lesion, male   AKI (acute kidney injury) (HCC)   Hypothyroidism, acquired   Essential hypertension   Sepsis, unspecified organism (HCC)   MRSA cellulitis of left foot   Serratia marcescens infection (HCC)   Escherichia coli infection   CAD in native artery   Diarrhea   Paroxysmal atrial fibrillation (HCC)   Uncontrolled type 2 diabetes mellitus with complication (HCC)  Sepsis  left foot wound positive MRSA/Serratia Marcescens - likely from cellulitis of  L foot,+Purulence at Rt BKA stump and gangrenous changes in 2 toes of Lt foot --CT with subcutaneous edema around ankle tracking in the dorsum of the foot and along the ball of the foot.  -ABI inconclusive due to non compressible vessels -previous BKA in [facility] in May -Wound Cx from Lt foot wound: polymicrobial including Staph -Culture from right BKA pending -on IV Vancomycin and Zosyn-Day 7 -Arterial Duplex from [facility] on 9/7 in Care everywhere/EPIC: notes hemodynamically significant stenosis in L superficial fem artery/anterior tibial and proximal R SFA -Patient scheduled for Arteriography for evaluation of his left foot on Friday 9/22 -Counseled patient that as long as he refused revision of his right BKA was at risk for eventually dying from his infection. Patient stated he understood this but did not want revision especially since it just paid $9000 for a prosthetic. -Spoke with Dr. Johny Sax ID, recommended  placement of PICC line and discharge on Ceftriaxone and Vancomycin -Arrange for home health, RN to ensure antibiotic administered properly. -Schedule follow-up with D rTodd F Early/Dr.Cain. VVS in 2-3 weeks. -Discharge on 9/22 unless further surgery recommended and accepted by patient  Diarrhea positive Enteropathogenic Escherichia col -diarrhea improved,  GI pathogen panel showed Enteropathogenic  Escherichia coli.  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 Johny Sax ID, phone consult D rTodd F Early/Dr.Cain. VVS    Procedures/Significant Events:  9/14 CT renal stone study:-Colon is decompressed, -Urinary bladder distention with subsequent mild bilateral hydronephrosis.  -Question of cirrhotic hepatic morphology. -Single gallstone without gallbladder inflammation. -diverticulosis without diverticulitis. -Enlarged right inguinal node is nonspecific,  9/17 CT left foot W contrast:-No bony destructive findings to suggest osteomyelitis. -Subcutaneous edema around the ankle and tracking in the dorsum of the foot and along the ball of the foot. Cellulitis? - Diffuse bony demineralization.    Cultures 9/13 blood right/left forearm negative 9/13 urine positive Candida albicans 9/14 C. difficile negative 9/14 stool positive Enteropathogenic Escherichia coli 9/16 left foot wound positive MRSA/Serratia Marcescens 9/21 positive MRSA by PCR   Antimicrobials: Zosyn 9/16>> Vancomycin 9/16>>   Devices    LINES / TUBES:       Continuous Infusions: . sodium chloride 75 mL/hr at 01/03/16 1949     Objective: Vitals:   01/02/16 1200 01/02/16 2223 01/03/16 0513 01/03/16 1721  BP: (!) 142/61 (!) 141/70 131/67 (!) 129/59  Pulse: 83 86 81 95  Resp: 18 18 16 18   Temp: 100 F (37.8 C) 99.3 F (37.4 C) 98.9 F (37.2 C) 97.6 F (36.4 C)  TempSrc: Oral     SpO2: 98% 97% 99% 97%  Weight:      Height:        Intake/Output Summary (Last 24 hours) at 01/03/16 2049 Last data filed at 01/03/16 1847  Gross per 24 hour  Intake          1523.75 ml  Output             1475 ml  Net            48.75 ml   Filed Weights   12/27/15 0136  Weight: 72.4 kg (159 lb 11.2 oz)    Examination:  General: A/O 4, NAD, No acute respiratory distress Eyes: negative scleral hemorrhage, negative anisocoria, negative icterus ENT: Negative Runny nose, negative gingival bleeding, Neck:  Negative scars, masses, torticollis, lymphadenopathy, JVD Lungs: Clear to auscultation bilaterally without wheezes or crackles Cardiovascular: Regular rate and rhythm without murmur gallop or rub normal S1 and S2 Abdomen: negative abdominal pain, nondistended, positive soft, bowel sounds, no rebound, no  ascites, no appreciable mass Extremities: right BKA draining brown purulent material from lesion on the medial aspect of stump. Left foot positive edema with dry gangrenous toes second and fourth, negative pain to palpation of stump or left foot  Psychiatric:  Negative depression, negative anxiety, negative fatigue, negative mania  Central nervous system:  Cranial nerves II through XII intact, tongue/uvula midline, all extremities muscle strength 5/5, sensation intact throughout, negative dysarthria, negative expressive aphasia, negative receptive aphasia.  .     Data Reviewed: Care during the described time interval was provided by me .  I have reviewed this patient's available data, including medical history, events of note, physical  examination, and all test results as part of my evaluation. I have personally reviewed and interpreted all radiology studies.  CBC:  Recent Labs Lab 12/29/15 0530 12/31/15 0824 01/01/16 0411 01/02/16 0610 01/03/16 0537  WBC 17.0* 15.3* 13.4* 14.3* 13.3*  HGB 10.8* 9.8* 8.9* 9.0* 9.2*  HCT 33.7* 30.0* 27.7* 28.1* 29.0*  MCV 92.1 89.6 89.6 90.1 90.1  PLT 295 290 274 303 299   Basic Metabolic Panel:  Recent Labs Lab 12/28/15 0848 12/29/15 0530 12/31/15 0824 01/02/16 0610 01/03/16 0537  NA 134* 137 130* 127* 130*  K 4.2 4.6 4.0 4.2 4.1  CL 109 111 104 103 104  CO2 20* 18* 20* 19* 20*  GLUCOSE 149* 183* 165* 156* 129*  BUN 36* 26* 14 16 14   CREATININE 1.13 1.04 1.20 1.27* 1.26*  CALCIUM 8.6* 8.6* 8.1* 8.1* 8.2*   GFR: Estimated Creatinine Clearance: 57.5 mL/min (by C-G formula based on SCr of 1.26 mg/dL (H)). Liver Function Tests:  Recent Labs Lab 12/29/15 0530 12/31/15 0824  AST 222* 50*  ALT 91* 50  ALKPHOS 167* 176*  BILITOT 0.5 0.5  PROT 8.0 6.3*  ALBUMIN 1.6* 1.5*   No results for input(s): LIPASE, AMYLASE in the last 168 hours. No results for input(s): AMMONIA in the last 168 hours. Coagulation Profile:  Recent Labs Lab 12/30/15 0626 12/31/15 0824 01/01/16 0411 01/02/16 0610 01/03/16 0537  INR 2.88 3.41 2.84 1.46 1.40   Cardiac Enzymes: No results for input(s): CKTOTAL, CKMB, CKMBINDEX, TROPONINI in the last 168 hours. BNP (last 3 results) No results for input(s): PROBNP in the last 8760 hours. HbA1C: No results for input(s): HGBA1C in the last 72 hours. CBG:  Recent Labs Lab 01/02/16 2224 01/03/16 0609 01/03/16 0806 01/03/16 1154 01/03/16 1649  GLUCAP 154* 122* 129* 146* 163*   Lipid Profile: No results for input(s): CHOL, HDL, LDLCALC, TRIG, CHOLHDL, LDLDIRECT in the last 72 hours. Thyroid Function Tests: No results for input(s): TSH, T4TOTAL, FREET4, T3FREE, THYROIDAB in the last 72 hours. Anemia Panel: No results for input(s):  VITAMINB12, FOLATE, FERRITIN, TIBC, IRON, RETICCTPCT in the last 72 hours. Sepsis Labs: No results for input(s): PROCALCITON, LATICACIDVEN in the last 168 hours.  Recent Results (from the past 240 hour(s))  Culture, blood (Routine x 2)     Status: None   Collection Time: 12/26/15  7:00 PM  Result Value Ref Range Status   Specimen Description BLOOD RIGHT FOREARM  Final   Special Requests BOTTLES DRAWN AEROBIC AND ANAEROBIC 5CC  Final   Culture NO GROWTH 5 DAYS  Final   Report Status 12/31/2015 FINAL  Final  Culture, blood (Routine x 2)     Status: None   Collection Time: 12/26/15  7:08 PM  Result Value Ref Range Status   Specimen Description BLOOD LEFT FOREARM  Final   Special Requests  BOTTLES DRAWN AEROBIC AND ANAEROBIC 5CC  Final   Culture NO GROWTH 5 DAYS  Final   Report Status 12/31/2015 FINAL  Final  Urine culture     Status: Abnormal   Collection Time: 12/26/15  7:42 PM  Result Value Ref Range Status   Specimen Description URINE, CATHETERIZED  Final   Special Requests NONE  Final   Culture 30,000 COLONIES/mL CANDIDA ALBICANS (A)  Final   Report Status 12/28/2015 FINAL  Final  C difficile quick scan w PCR reflex     Status: None   Collection Time: 12/27/15  1:05 AM  Result Value Ref Range Status   C Diff antigen NEGATIVE NEGATIVE Final   C Diff toxin NEGATIVE NEGATIVE Final   C Diff interpretation No C. difficile detected.  Final  Gastrointestinal Panel by PCR , Stool     Status: Abnormal   Collection Time: 12/27/15  1:05 AM  Result Value Ref Range Status   Campylobacter species NOT DETECTED NOT DETECTED Final   Plesimonas shigelloides NOT DETECTED NOT DETECTED Final   Salmonella species NOT DETECTED NOT DETECTED Final   Yersinia enterocolitica NOT DETECTED NOT DETECTED Final   Vibrio species NOT DETECTED NOT DETECTED Final   Vibrio cholerae NOT DETECTED NOT DETECTED Final   Enteroaggregative E coli (EAEC) NOT DETECTED NOT DETECTED Final   Enteropathogenic E coli (EPEC)  DETECTED (A) NOT DETECTED Final    Comment: CRITICAL RESULT CALLED TO, READ BACK BY AND VERIFIED WITH: Ginger Gleeson @ 1354 12/28/15 by [facility]    Enterotoxigenic E coli (ETEC) NOT DETECTED NOT DETECTED Final   Shiga like toxin producing E coli (STEC) NOT DETECTED NOT DETECTED Final   Shigella/Enteroinvasive E coli (EIEC) NOT DETECTED NOT DETECTED Final   Cryptosporidium NOT DETECTED NOT DETECTED Final   Cyclospora cayetanensis NOT DETECTED NOT DETECTED Final   Entamoeba histolytica NOT DETECTED NOT DETECTED Final   Giardia lamblia NOT DETECTED NOT DETECTED Final   Adenovirus F40/41 NOT DETECTED NOT DETECTED Final   Astrovirus NOT DETECTED NOT DETECTED Final   Norovirus GI/GII NOT DETECTED NOT DETECTED Final   Rotavirus A NOT DETECTED NOT DETECTED Final   Sapovirus (I, II, IV, and V) NOT DETECTED NOT DETECTED Final  Aerobic Culture (superficial specimen)     Status: None   Collection Time: 12/29/15 10:57 AM  Result Value Ref Range Status   Specimen Description WOUND FOOT LEFT  Final   Special Requests Normal  Final   Gram Stain   Final    ABUNDANT WBC PRESENT, PREDOMINANTLY PMN RARE SQUAMOUS EPITHELIAL CELLS PRESENT ABUNDANT GRAM POSITIVE COCCI IN PAIRS IN CLUSTERS FEW GRAM POSITIVE RODS RARE GRAM NEGATIVE RODS    Culture   Final    ABUNDANT METHICILLIN RESISTANT STAPHYLOCOCCUS AUREUS MODERATE SERRATIA MARCESCENS    Report Status 12/31/2015 FINAL  Final   Organism ID, Bacteria METHICILLIN RESISTANT STAPHYLOCOCCUS AUREUS  Final   Organism ID, Bacteria SERRATIA MARCESCENS  Final      Susceptibility   Methicillin resistant staphylococcus aureus - MIC*    CIPROFLOXACIN >=8 RESISTANT Resistant     ERYTHROMYCIN >=8 RESISTANT Resistant     GENTAMICIN <=0.5 SENSITIVE Sensitive     OXACILLIN >=4 RESISTANT Resistant     TETRACYCLINE 2 SENSITIVE Sensitive     VANCOMYCIN <=0.5 SENSITIVE Sensitive     TRIMETH/SULFA <=10 SENSITIVE Sensitive     CLINDAMYCIN <=0.25 SENSITIVE Sensitive      RIFAMPIN <=0.5 SENSITIVE Sensitive     Inducible Clindamycin NEGATIVE Sensitive     *  ABUNDANT METHICILLIN RESISTANT STAPHYLOCOCCUS AUREUS   Serratia marcescens - MIC*    CEFAZOLIN >=64 RESISTANT Resistant     CEFEPIME <=1 SENSITIVE Sensitive     CEFTAZIDIME <=1 SENSITIVE Sensitive     CEFTRIAXONE <=1 SENSITIVE Sensitive     CIPROFLOXACIN <=0.25 SENSITIVE Sensitive     GENTAMICIN <=1 SENSITIVE Sensitive     TRIMETH/SULFA <=20 SENSITIVE Sensitive     * MODERATE SERRATIA MARCESCENS  Surgical pcr screen     Status: Abnormal   Collection Time: 01/03/16  1:50 AM  Result Value Ref Range Status   MRSA, PCR POSITIVE (A) NEGATIVE Final    Comment: RESULT CALLED TO, READ BACK BY AND VERIFIED WITH: N WYLIE,RN @0645  01/03/16 MKELLY,MLT    Staphylococcus aureus POSITIVE (A) NEGATIVE Final    Comment:        The Xpert SA Assay (FDA approved for NASAL specimens in patients over 68 years of age), is one component of a comprehensive surveillance program.  Test performance has been validated by [facility] for patients greater than or equal to 68 year old. It is not intended to diagnose infection nor to guide or monitor treatment.   Aerobic Culture (superficial specimen)     Status: None (Preliminary result)   Collection Time: 01/03/16 12:56 PM  Result Value Ref Range Status   Specimen Description ABSCESS LEG RIGHT  Final   Special Requests DISCHARGE RBKA  Final   Gram Stain   Final    RARE WBC PRESENT, PREDOMINANTLY PMN NO ORGANISMS SEEN    Culture PENDING  Incomplete   Report Status PENDING  Incomplete         Radiology Studies: No results found.      Scheduled Meds: . amitriptyline  25 mg Oral QHS  . amLODipine  10 mg Oral Daily  . atorvastatin  40 mg Oral Daily  . carvedilol  6.25 mg Oral BID WC  . cefUROXime (ZINACEF)  IV  1.5 g Intravenous On Call to OR  . Chlorhexidine Gluconate Cloth  6 each Topical Q0600  . feeding supplement (GLUCERNA SHAKE)  237 mL Oral TID  BM  . ferrous sulfate  325 mg Oral BID WC  . heparin subcutaneous  5,000 Units Subcutaneous Q8H  . hydrALAZINE  25 mg Oral TID  . insulin aspart  0-15 Units Subcutaneous TID WC  . insulin aspart  0-5 Units Subcutaneous QHS  . insulin glargine  17 Units Subcutaneous QHS  . levothyroxine  25 mcg Oral QAC breakfast  . multivitamin with minerals  1 tablet Oral Daily  . mupirocin ointment  1 application Nasal BID  . piperacillin-tazobactam (ZOSYN)  IV  3.375 g Intravenous Q8H  . pregabalin  100 mg Oral Daily   And  . pregabalin  150 mg Oral BID  . vancomycin  750 mg Intravenous Q12H   Continuous Infusions: . sodium chloride 75 mL/hr at 01/03/16 1949     LOS: 8 days    Time spent:40 min    WOODS, Roselind MessierURTIS J, MD [facility] Pager [phone]  If 7PM-7AM, please contact night-coverage [email or web address] Password [email or web address] 01/03/2016, 8:49 PM

## 2016-01-03 NOTE — Progress Notes (Signed)
   After discussing case with patient this a.m he does not want to proceed and desires more time to think about it I discussed my concern that this will not heal and he demonstrates good understanding of this but continues to believe it will heal primarily.  The next available day to operate on Mr Ronald Simpson will be Monday should he be amenable at that time. Will continue to follow.   Brandon C. Randie Heinzain, MD [facility] Office: [phone] Pager: [phone]

## 2016-01-03 NOTE — Progress Notes (Signed)
Patient ID: Ronald Simpson, male   DOB: Mar 01, 1946, 70 y.o.   MRN: [id number] Had several discussions with the patient explaining and no option other than revision to above-knee amputation. This was scheduled for this morning with Dr.Cain. Patient is now refusing this. I again discussed this with him telling him that there was no other option. He states that he just isn't mentally prepared for this. I explained that there is no time on the operative schedule tomorrow in the next opportunity would be Monday. He does not appear to be toxic currently. Will follow and tentatively scheduled for right above-knee amputation on Monday

## 2016-01-03 NOTE — Progress Notes (Signed)
Initial Nutrition Assessment  DOCUMENTATION CODES:   Not applicable  INTERVENTION:   -Glucerna Shake po TID, each supplement provides 220 kcal and 10 grams of protein -MVI daily  NUTRITION DIAGNOSIS:   Increased nutrient needs related to wound healing as evidenced by estimated needs.  GOAL:   Patient will meet greater than or equal to 90% of their needs  MONITOR:   PO intake, Supplement acceptance, Labs, Weight trends, Skin, I & O's  REASON FOR ASSESSMENT:   Malnutrition Screening Tool    ASSESSMENT:   70 y.o. with a history of CAD status post CABG in 2016, history of CVA, diabetes mellitus type 2, hepatitis C, chronic anticoagulation for unknown reasons. He presented with symptoms of malaise for 4 days. He was found to have sepsis, possibly from the left foot vs diarrhea.   Pt admitted with sepsis, secondary to lt foot cellulitis.   Spoke with pt at bedside, who was sleeping and not very talkative at time of visit. He reports he is "starving" due to PO's being held for potential surgery today (pt refused rt AKA this morning). Lunch tray delivered at conclusion of RD visit, which this RD helped pt with set up. Pt estimates he has been consuming about 25% of meals this admission. Pt estimated poor appetite for the past 1-2 weeks PTA due to not feeling well.   Pt denies any recent wt loss. No wt hx available to assess.   Nutrition-Focused physical exam completed. Findings are no fat depletion, no muscle depletion, and no edema.   Per vascular surgery service, pt is re-scheduled for rt AKA on Monday, 01/07/16. Pt also scheduled for arteriography for lt foot on Friday, 01/04/16.   Labs reviewed: CBGS: 122-163.   Diet Order:  Diet heart healthy/carb modified Room service appropriate? Yes; Fluid consistency: Thin; Fluid restriction: 1200 mL Fluid  1900-Skin:  Wound (see comment) (lt foot cellulitis, rt BKA)  Last BM:  12/29/15  Height:   Ht Readings from Last 1 Encounters:   12/27/15 5\' 10"  (1.778 m)    Weight:   Wt Readings from Last 1 Encounters:  12/27/15 159 lb 11.2 oz (72.4 kg)    Ideal Body Weight:  70.6 kg  BMI:  Body mass index is 22.91 kg/m.  Estimated Nutritional Needs:   Kcal:  1900-2100  Protein:  100-115 grams  Fluid:  1.9-2.1 L  EDUCATION NEEDS:   Education needs addressed  Hellen Shanley A. Mayford KnifeWilliams, RD, LDN, CDE Pager: [phone] After hours Pager: [phone]

## 2016-01-04 LAB — GLUCOSE, CAPILLARY
Glucose-Capillary: 130 mg/dL — ABNORMAL HIGH (ref 65–99)
Glucose-Capillary: 158 mg/dL — ABNORMAL HIGH (ref 65–99)
Glucose-Capillary: 181 mg/dL — ABNORMAL HIGH (ref 65–99)
Glucose-Capillary: 163 mg/dL — ABNORMAL HIGH (ref 65–99)

## 2016-01-04 LAB — CBC
HCT: 26 % — ABNORMAL LOW (ref 39.0–52.0)
Hemoglobin: 8.2 g/dL — ABNORMAL LOW (ref 13.0–17.0)
MCH: 28.6 pg (ref 26.0–34.0)
MCHC: 31.5 g/dL (ref 30.0–36.0)
MCV: 90.6 fL (ref 78.0–100.0)
Platelets: 307 K/uL (ref 150–400)
RBC: 2.87 MIL/uL — ABNORMAL LOW (ref 4.22–5.81)
RDW: 13.3 % (ref 11.5–15.5)
WBC: 11.1 K/uL — ABNORMAL HIGH (ref 4.0–10.5)

## 2016-01-04 LAB — VANCOMYCIN, TROUGH: VANCOMYCIN TR: 17 ug/mL (ref 15–20)

## 2016-01-04 LAB — PROTIME-INR
INR: 1.44
Prothrombin Time: 17.6 s — ABNORMAL HIGH (ref 11.4–15.2)

## 2016-01-04 NOTE — Progress Notes (Signed)
PROGRESS NOTE    Ronald Simpson  VQQ:[id number] DOB: [birth year] DOA: 12/26/2015 PCP: [name], MD   Brief Narrative:  70 y.o. BM PMHx CAD S/P CABG in 2015, CVA, DM  Type 2 uncontrolled with complication, Hep C, on chronic anticoagulation for unknown reasons.   Who presents with malaise for four days.  Caveat that all history is collected from patient's daughter at the bedside as well as CareEverywhere, as patient appears disoriented.    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  He was briefly in rehab, but discharged about a week to two weeks ago.  Last Thursday, he was seen in the [facility] ER for pain in his stump (after falling on the stump) and pain in his left toes as well as color change/blackening of the toes.  The patient's daughter and mother state that he was asked to stay in the hospital but refused, although the chart in [facility] states that the case was discussed with his surgeon Dr. Ivor Costa who plan to inform the patient's wound clinic in [facility] and coordinate him with vascular surgery.  Per the chart, the patient was discharged with several days of oxycodone for pain. Over the weekend the patient's mother notes that he was "just lying around", seemed tired, wouldn't eat anything, complained of vague abdominal discomfort and malaise, and just "wasn't himself". Today they called EMS, who found the patient with foul-smelling urine in the commode, and brought him to the ER.   Subjective: Pt has no new complaints. He was awaiting to hear from the vascular surgeon regarding plan for further care   Assessment & Plan:   Principal Problem:   Sepsis (HCC) Active Problems:   Hepatitis C without hepatic coma   Coronary artery disease due to lipid rich plaque   Type 2 diabetes mellitus with diabetic peripheral angiopathy without gangrene, without long-term current use of insulin (HCC)   History of CVA (cerebrovascular accident)  Genital lesion, male   AKI (acute kidney injury) (HCC)   Hypothyroidism, acquired   Essential hypertension   Sepsis, unspecified organism (HCC)   MRSA cellulitis of left foot   Serratia marcescens infection (HCC)   Escherichia coli infection   CAD in native artery   Diarrhea   Paroxysmal atrial fibrillation (HCC)   Uncontrolled type 2 diabetes mellitus with complication (HCC)  Sepsis  left foot wound positive MRSA/Serratia Marcescens - likely from cellulitis of  L foot,+Purulence at Rt BKA stump and gangrenous changes in 2 toes of Lt foot. Vascular states patient may require arteriography for further evaluation. --CT with subcutaneous edema around ankle tracking in the dorsum of the foot and along the ball of the foot.  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 Patient stated he understood this but did not want revision especially since it just paid $9000 for a prosthetic. -Spoke with Dr. Johny Sax ID, recommended placement of PICC line and discharge on Ceftriaxone and Vancomycin -Arrange for home health, RN to ensure antibiotic administered  properly. -Schedule follow-up with D rTodd F Early/Dr.Cain. VVS in 2-3 weeks. -Vascular recommending surgery this monday  Diarrhea positive Enteropathogenic Escherichia col -diarrhea improved,  GI pathogen panel showed Enteropathogenic Escherichia coli.  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 Johny SaxJeffrey Hatcher ID, phone consult D rTodd F Early/Dr.Cain. VVS   Procedures/Significant Events:  9/14 CT renal stone study:-Colon is decompressed, -Urinary bladder distention with subsequent mild bilateral hydronephrosis.  -Question of cirrhotic hepatic morphology. -Single gallstone without gallbladder inflammation. -diverticulosis without diverticulitis. -Enlarged right inguinal node is nonspecific,  9/17 CT left foot W contrast:-No bony destructive findings to suggest osteomyelitis. -Subcutaneous edema around the ankle and tracking in the dorsum of the foot and along the ball of the foot. Cellulitis? - Diffuse bony demineralization.    Cultures 9/13 blood right/left forearm negative 9/13 urine positive Candida albicans 9/14 C. difficile negative 9/14 stool positive Enteropathogenic Escherichia coli 9/16 left foot wound positive MRSA/Serratia Marcescens 9/21 positive MRSA by PCR   Antimicrobials: Zosyn 9/16>> Vancomycin 9/16>>   Devices    LINES / TUBES:      Continuous Infusions: . sodium chloride 75 mL/hr at 01/03/16 1949      Objective: Vitals:   01/03/16 2207 01/04/16 0603 01/04/16 0941 01/04/16 1337  BP: 124/66 131/67 (!) 147/75 115/63  Pulse: 87 85 90 85  Resp: 18 18 18 17   Temp: (!) 100.7 F (38.2 C) 99 F (37.2 C)  99.3 F (37.4 C)  TempSrc:  Oral  Oral  SpO2: 98% 97% 98% 100%  Weight:      Height:        Intake/Output Summary (Last 24 hours) at 01/04/16 1706 Last data filed at 01/04/16 1022  Gross per 24 hour  Intake          2151.25 ml  Output                0 ml  Net          2151.25 ml   Filed Weights   12/27/15 0136  Weight: 72.4 kg (159 lb 11.2 oz)    Examination:  General: A/O 4, NAD, No acute respiratory distress Eyes: negative scleral hemorrhage, negative anisocoria, negative icterus ENT: Negative Runny nose, negative gingival bleeding, Neck:  Negative scars, masses, torticollis, lymphadenopathy, JVD Lungs: Clear to auscultation bilaterally without wheezes or crackles, equal chest rise Cardiovascular: Regular rate and rhythm without murmur gallop or rub normal S1 and S2 Abdomen: negative abdominal pain, nondistended, positive soft, bowel sounds, no rebound, no ascites, no appreciable mass Extremities: right BKA draining brown purulent material from lesion on the medial aspect of stump. Left foot positive edema with dry gangrenous toes second and fourth, negative pain to  palpation of stump or left foot  Psychiatric:  Negative depression, negative anxiety, negative fatigue, negative mania  Central nervous system:  No facial asymmetry, tongue/uvula midline,  sensation intact throughout, negative dysarthria, negative expressive aphasia, negative receptive aphasia.    Data Reviewed: Care during the described time interval was provided by me .  I have reviewed this patient's available data, including medical history, events of note, physical examination, and all test results as part of my evaluation.  CBC:  Recent Labs Lab 12/31/15 0824 01/01/16 0411 01/02/16 0610  01/03/16 0537 01/04/16 0539  WBC 15.3* 13.4* 14.3* 13.3* 11.1*  HGB 9.8* 8.9* 9.0* 9.2* 8.2*  HCT 30.0* 27.7* 28.1* 29.0* 26.0*  MCV 89.6 89.6 90.1 90.1 90.6  PLT 290 274 303 299 307   Basic Metabolic Panel:  Recent Labs Lab 12/29/15 0530 12/31/15 0824 01/02/16 0610 01/03/16 0537  NA 137 130* 127* 130*  K 4.6 4.0 4.2 4.1  CL 111 104 103 104  CO2 18* 20* 19* 20*  GLUCOSE 183* 165* 156* 129*  BUN 26* 14 16 14   CREATININE 1.04 1.20 1.27* 1.26*  CALCIUM 8.6* 8.1* 8.1* 8.2*   GFR: Estimated Creatinine Clearance: 57.5 mL/min (by C-G formula based on SCr of 1.26 mg/dL (H)). Liver Function Tests:  Recent Labs Lab 12/29/15 0530 12/31/15 0824  AST 222* 50*  ALT 91* 50  ALKPHOS 167* 176*  BILITOT 0.5 0.5  PROT 8.0 6.3*  ALBUMIN 1.6* 1.5*   No results for input(s): LIPASE, AMYLASE in the last 168 hours. No results for input(s): AMMONIA in the last 168 hours. Coagulation Profile:  Recent Labs Lab 12/31/15 0824 01/01/16 0411 01/02/16 0610 01/03/16 0537 01/04/16 0539  INR 3.41 2.84 1.46 1.40 1.44   Cardiac Enzymes: No results for input(s): CKTOTAL, CKMB, CKMBINDEX, TROPONINI in the last 168 hours. BNP (last 3 results) No results for input(s): PROBNP in the last 8760 hours. HbA1C: No results for input(s): HGBA1C in the last 72 hours. CBG:  Recent Labs Lab 01/03/16 1649 01/03/16 2205 01/04/16 0753 01/04/16 1157 01/04/16 1656  GLUCAP 163* 167* 130* 158* 181*   Lipid Profile: No results for input(s): CHOL, HDL, LDLCALC, TRIG, CHOLHDL, LDLDIRECT in the last 72 hours. Thyroid Function Tests: No results for input(s): TSH, T4TOTAL, FREET4, T3FREE, THYROIDAB in the last 72 hours. Anemia Panel: No results for input(s): VITAMINB12, FOLATE, FERRITIN, TIBC, IRON, RETICCTPCT in the last 72 hours. Sepsis Labs: No results for input(s): PROCALCITON, LATICACIDVEN in the last 168 hours.  Recent Results (from the past 240 hour(s))  Culture, blood (Routine x 2)      Status: None   Collection Time: 12/26/15  7:00 PM  Result Value Ref Range Status   Specimen Description BLOOD RIGHT FOREARM  Final   Special Requests BOTTLES DRAWN AEROBIC AND ANAEROBIC 5CC  Final   Culture NO GROWTH 5 DAYS  Final   Report Status 12/31/2015 FINAL  Final  Culture, blood (Routine x 2)     Status: None   Collection Time: 12/26/15  7:08 PM  Result Value Ref Range Status   Specimen Description BLOOD LEFT FOREARM  Final   Special Requests BOTTLES DRAWN AEROBIC AND ANAEROBIC 5CC  Final   Culture NO GROWTH 5 DAYS  Final   Report Status 12/31/2015 FINAL  Final  Urine culture     Status: Abnormal   Collection Time: 12/26/15  7:42 PM  Result Value Ref Range Status   Specimen Description URINE, CATHETERIZED  Final   Special Requests NONE  Final   Culture 30,000 COLONIES/mL CANDIDA ALBICANS (A)  Final   Report Status 12/28/2015 FINAL  Final  C difficile quick scan w PCR reflex     Status: None   Collection Time: 12/27/15  1:05 AM  Result Value Ref Range Status   C Diff antigen NEGATIVE NEGATIVE Final   C Diff toxin NEGATIVE NEGATIVE Final   C Diff interpretation No C. difficile detected.  Final  Gastrointestinal Panel by PCR , Stool     Status: Abnormal   Collection Time: 12/27/15  1:05 AM  Result Value Ref Range Status   Campylobacter species NOT DETECTED NOT DETECTED Final   Plesimonas shigelloides NOT DETECTED NOT DETECTED Final   Salmonella species NOT DETECTED NOT DETECTED Final   Yersinia enterocolitica NOT DETECTED NOT DETECTED Final   Vibrio species NOT DETECTED NOT DETECTED Final   Vibrio cholerae NOT DETECTED NOT DETECTED Final   Enteroaggregative E coli (EAEC) NOT DETECTED NOT DETECTED Final   Enteropathogenic E coli (EPEC) DETECTED (A) NOT DETECTED Final    Comment: CRITICAL RESULT CALLED TO, READ BACK BY AND VERIFIED WITH: Ginger Gleeson @ 1354 12/28/15 by [facility]    Enterotoxigenic E coli (ETEC) NOT DETECTED NOT DETECTED Final   Shiga like toxin producing E coli  (STEC) NOT DETECTED NOT DETECTED Final   Shigella/Enteroinvasive E coli (EIEC) NOT DETECTED NOT DETECTED Final   Cryptosporidium NOT DETECTED NOT DETECTED Final   Cyclospora cayetanensis NOT DETECTED NOT DETECTED Final   Entamoeba histolytica NOT DETECTED NOT DETECTED Final   Giardia lamblia NOT DETECTED NOT DETECTED Final   Adenovirus F40/41 NOT DETECTED NOT DETECTED Final   Astrovirus NOT DETECTED NOT DETECTED Final   Norovirus GI/GII NOT DETECTED NOT DETECTED Final   Rotavirus A NOT DETECTED NOT DETECTED Final   Sapovirus (I, II, IV, and V) NOT DETECTED NOT DETECTED Final  Aerobic Culture (superficial specimen)     Status: None   Collection Time: 12/29/15 10:57 AM  Result Value Ref Range Status   Specimen Description WOUND FOOT LEFT  Final   Special Requests Normal  Final   Gram Stain   Final    ABUNDANT WBC PRESENT, PREDOMINANTLY PMN RARE SQUAMOUS EPITHELIAL CELLS PRESENT ABUNDANT GRAM POSITIVE COCCI IN PAIRS IN CLUSTERS FEW GRAM POSITIVE RODS RARE GRAM NEGATIVE RODS    Culture   Final    ABUNDANT METHICILLIN RESISTANT STAPHYLOCOCCUS AUREUS MODERATE SERRATIA MARCESCENS    Report Status 12/31/2015 FINAL  Final   Organism ID, Bacteria METHICILLIN RESISTANT STAPHYLOCOCCUS AUREUS  Final   Organism ID, Bacteria SERRATIA MARCESCENS  Final      Susceptibility   Methicillin resistant staphylococcus aureus - MIC*    CIPROFLOXACIN >=8 RESISTANT Resistant     ERYTHROMYCIN >=8 RESISTANT Resistant     GENTAMICIN <=0.5 SENSITIVE Sensitive     OXACILLIN >=4 RESISTANT Resistant     TETRACYCLINE 2 SENSITIVE Sensitive     VANCOMYCIN <=0.5 SENSITIVE Sensitive     TRIMETH/SULFA <=10 SENSITIVE Sensitive     CLINDAMYCIN <=0.25 SENSITIVE Sensitive     RIFAMPIN <=0.5 SENSITIVE Sensitive     Inducible Clindamycin NEGATIVE Sensitive     * ABUNDANT METHICILLIN RESISTANT STAPHYLOCOCCUS AUREUS   Serratia marcescens - MIC*    CEFAZOLIN >=64 RESISTANT Resistant     CEFEPIME <=1 SENSITIVE Sensitive      CEFTAZIDIME <=1 SENSITIVE Sensitive     CEFTRIAXONE <=1 SENSITIVE Sensitive     CIPROFLOXACIN <=0.25 SENSITIVE Sensitive     GENTAMICIN <=1 SENSITIVE Sensitive  TRIMETH/SULFA <=20 SENSITIVE Sensitive     * MODERATE SERRATIA MARCESCENS  Surgical pcr screen     Status: Abnormal   Collection Time: 01/03/16  1:50 AM  Result Value Ref Range Status   MRSA, PCR POSITIVE (A) NEGATIVE Final    Comment: RESULT CALLED TO, READ BACK BY AND VERIFIED WITH: N WYLIE,RN @0645  01/03/16 MKELLY,MLT    Staphylococcus aureus POSITIVE (A) NEGATIVE Final    Comment:        The Xpert SA Assay (FDA approved for NASAL specimens in patients over 63 years of age), is one component of a comprehensive surveillance program.  Test performance has been validated by [facility] for patients greater than or equal to 61 year old. It is not intended to diagnose infection nor to guide or monitor treatment.   Aerobic Culture (superficial specimen)     Status: None (Preliminary result)   Collection Time: 01/03/16 12:56 PM  Result Value Ref Range Status   Specimen Description ABSCESS LEG RIGHT  Final   Special Requests DISCHARGE RBKA  Final   Gram Stain   Final    RARE WBC PRESENT, PREDOMINANTLY PMN NO ORGANISMS SEEN    Culture NO GROWTH 1 DAY  Final   Report Status PENDING  Incomplete         Radiology Studies: No results found.      Scheduled Meds: . amitriptyline  25 mg Oral QHS  . amLODipine  10 mg Oral Daily  . atorvastatin  40 mg Oral Daily  . carvedilol  6.25 mg Oral BID WC  . Chlorhexidine Gluconate Cloth  6 each Topical Q0600  . feeding supplement (GLUCERNA SHAKE)  237 mL Oral TID BM  . ferrous sulfate  325 mg Oral BID WC  . heparin subcutaneous  5,000 Units Subcutaneous Q8H  . hydrALAZINE  25 mg Oral TID  . insulin aspart  0-15 Units Subcutaneous TID WC  . insulin aspart  0-5 Units Subcutaneous QHS  . insulin glargine  17 Units Subcutaneous QHS  . levothyroxine  25 mcg Oral  QAC breakfast  . multivitamin with minerals  1 tablet Oral Daily  . mupirocin ointment  1 application Nasal BID  . piperacillin-tazobactam (ZOSYN)  IV  3.375 g Intravenous Q8H  . pregabalin  100 mg Oral Daily   And  . pregabalin  150 mg Oral BID  . vancomycin  750 mg Intravenous Q12H   Continuous Infusions: . sodium chloride 75 mL/hr at 01/03/16 1949     LOS: 9 days    Time spent:35 min  Penny Pia, MD [facility] Pager [phone]  If 7PM-7AM, please contact night-coverage [email or web address] Password [facility] 01/04/2016, 5:06 PM

## 2016-01-04 NOTE — Progress Notes (Addendum)
     Surgery for right AKA plan for Mon.   Non change in exam right BKA purulent drainage. His left foot is stable with dry gangrenous changes to the second and fourth toe.  COLLINS, EMMA [name] PA-C
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
## 2016-01-04 NOTE — Progress Notes (Signed)
Pharmacy Antibiotic Note  Ronald Simpson is a 70 y.o. male admitted on 12/26/2015 with sepsis related to a wound infection. Pharmacy was consulted for vancomycin and Zosyn dosing. Patient was positive for enteropathogenic E. coli on his GI panel.    Day #7 of broad spectrum abx for infectious gastroenteritis / infected left foot wound. Dry gangrene noted on left second and fourth toes. R AKA is now scheduled for Monday, 9/25. Vascular explained this is really the only option, but may send patient home with PICC line if he still refuses. Currently afebrile (spiked fever to 100.7 last night), WBC stable at 11.1. ARF improving overall, back up slightly at 1.26, CrCl ~3255ml/min. Properly drawn vancomycin trough was 17.  Plan: Continue vancomycin 750 mg IV Q12 Continue Zosyn 3.375 gm IV q8h (4 hour infusion) Monitor clinical picture and renal function F/U abx deescalation / LOT  Height: 5\' 10"  (177.8 cm) Weight: 159 lb 11.2 oz (72.4 kg) IBW/kg (Calculated) : 73  Temp (24hrs), Avg:99.1 F (37.3 C), Min:97.6 F (36.4 C), Max:100.7 F (38.2 C)   Recent Labs Lab 12/29/15 0530 12/31/15 0824  01/01/16 0411 01/02/16 0610 01/03/16 0537 01/03/16 1752 01/04/16 0539  WBC 17.0* 15.3*  --  13.4* 14.3* 13.3*  --  11.1*  CREATININE 1.04 1.20  --   --  1.27* 1.26*  --   --   VANCOTROUGH  --   --   < >  --   --   --  38* 17  < > = values in this interval not displayed.  Estimated Creatinine Clearance: 57.5 mL/min (by C-G formula based on SCr of 1.26 mg/dL (H)).    Not on File  Antimicrobials this admission: Rocephin 9/14  >> 9/15 Vancomycin 9/13 x 1, 9/16 >> Zosyn 9/13 x 1, 9/16 >>  Levels/Adjustments: 9/18 VT 20 (drawn 1 hr late) 9/21 VT 38 (drawn while vancomycin was infusing) 9/22 VT 17  Microbiology results: 9/13 BCx: ngF 9/13 UCx: 30,000 candida albicans 9/15 C-diff neg 9/15- GI panel showed enteropathic ecoli 9/16 Wound culture: MRSA & serratia marcescens (R-ancef)  Thank you  for allowing pharmacy to be a part of this patient's care.  Steffanie RainwaterKathryn Riaan [place], Student-PharmD 01/04/2016 8:55 AM

## 2016-01-05 LAB — GLUCOSE, CAPILLARY
GLUCOSE-CAPILLARY: 148 mg/dL — AB (ref 65–99)
GLUCOSE-CAPILLARY: 148 mg/dL — AB (ref 65–99)
Glucose-Capillary: 110 mg/dL — ABNORMAL HIGH (ref 65–99)
Glucose-Capillary: 152 mg/dL — ABNORMAL HIGH (ref 65–99)
Glucose-Capillary: 158 mg/dL — ABNORMAL HIGH (ref 65–99)

## 2016-01-05 LAB — CBC
HCT: 25.3 % — ABNORMAL LOW (ref 39.0–52.0)
Hemoglobin: 8.2 g/dL — ABNORMAL LOW (ref 13.0–17.0)
MCH: 29.1 pg (ref 26.0–34.0)
MCHC: 32.4 g/dL (ref 30.0–36.0)
MCV: 89.7 fL (ref 78.0–100.0)
PLATELETS: 284 10*3/uL (ref 150–400)
RBC: 2.82 MIL/uL — ABNORMAL LOW (ref 4.22–5.81)
RDW: 13.3 % (ref 11.5–15.5)
WBC: 10.8 10*3/uL — ABNORMAL HIGH (ref 4.0–10.5)

## 2016-01-05 LAB — PROTIME-INR
INR: 1.42
PROTHROMBIN TIME: 17.5 s — AB (ref 11.4–15.2)

## 2016-01-05 MED ORDER — OXYCODONE HCL 5 MG PO TABS
10.0000 mg | ORAL_TABLET | Freq: Four times a day (QID) | ORAL | Status: DC | PRN
  Administered 2016-01-05 – 2016-01-14 (×14): 10 mg via ORAL
  Filled 2016-01-05 (×16): qty 2

## 2016-01-05 NOTE — Progress Notes (Signed)
PROGRESS NOTE    Ronald Simpson  ZOX:[id number] DOB: Oct 20, 1945 DOA: 12/26/2015 PCP: Tilden DomeSCHNEIDER,RICHARD, MD   Brief Narrative:  70 y.o. BM PMHx CAD S/P CABG in 2015, CVA, DM  Type 2 uncontrolled with complication, Hep C, on chronic anticoagulation for unknown reasons.   Who presents with malaise for four days.  Caveat that all history is collected from patient's daughter at the bedside as well as CareEverywhere, as patient appears disoriented.    The patient [facility].  He was briefly in rehab, but discharged about a week to two weeks ago.  Last Thursday, he was [facility] for pain in his stump (after falling on the stump) and pain in his left toes as well as color change/blackening of the toes.  The patient's daughter and mother state that he was asked to stay in the hospital but refused, although the chart in CareEvereywhere states that the case was discussed with his surgeon Dr. Ivor CostaSchweizer who plan to inform the [facility] and coordinate him with vascular surgery.  Per the chart, the patient was discharged with several days of oxycodone for pain. Over the weekend the patient's mother notes that he was "just lying around", seemed tired, wouldn't eat anything, complained of vague abdominal discomfort and malaise, and just "wasn't himself". Today they called EMS, who found the patient with foul-smelling urine in the commode, and brought him to the ER.   Subjective: He states that Monday he will have operation per his discussion with the surgeons  Assessment & Plan:   Principal Problem:   Sepsis (HCC) Active Problems:   Hepatitis C without hepatic coma   Coronary artery disease due to lipid rich plaque   Type 2 diabetes mellitus with diabetic peripheral angiopathy without gangrene, without long-term current use of insulin (HCC)   History of CVA (cerebrovascular accident)   Genital lesion, male   AKI  (acute kidney injury) (HCC)   Hypothyroidism, acquired   Essential hypertension   Sepsis, unspecified organism (HCC)   MRSA cellulitis of left foot   Serratia marcescens infection (HCC)   Escherichia coli infection   CAD in native artery   Diarrhea   Paroxysmal atrial fibrillation (HCC)   Uncontrolled type 2 diabetes mellitus with complication (HCC)  Sepsis  left foot wound positive MRSA/Serratia Marcescens - likely from cellulitis of  L foot,+Purulence at Rt BKA stump and gangrenous changes in 2 toes of Lt foot. Vascular states patient may require arteriography for further evaluation. --CT with subcutaneous edema around ankle tracking in the dorsum of the foot and along the ball of the foot.  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 Patient stated he understood this but did not want revision especially since it just paid $9000 for a prosthetic. -Spoke with Dr. Johny SaxJeffrey Hatcher ID, recommended placement of PICC line and discharge on Ceftriaxone and Vancomycin -Arrange for home health, RN to ensure antibiotic administered properly. -Schedule follow-up with  D rTodd F Early/Dr.Cain. [facility]VS in 2-3 weeks. -Vascular recommending surgery this monday  Diarrhea positive Enteropathogenic Escherichia col -diarrhea improved,  GI pathogen panel showed Enteropathogenic Escherichia coli.  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 Johny Sax ID, phone consult D rTodd F Early/Dr.Cain. [facility]   Procedures/Significant Events:  9/14 CT renal stone study:-Colon is decompressed, -Urinary bladder distention with subsequent mild bilateral hydronephrosis.  -Question of cirrhotic hepatic morphology. -Single gallstone without gallbladder inflammation. -diverticulosis without diverticulitis. -Enlarged right inguinal node is nonspecific,  9/17 CT left foot W contrast:-No bony destructive findings to suggest osteomyelitis. -Subcutaneous edema around the ankle and tracking in the dorsum of the foot and along the ball of the foot. Cellulitis? - Diffuse bony demineralization.    Cultures 9/13 blood right/left forearm negative 9/13 urine positive Candida albicans 9/14 C. difficile negative 9/14 stool positive Enteropathogenic Escherichia coli 9/16 left foot wound positive MRSA/Serratia Marcescens 9/21 positive MRSA by PCR   Antimicrobials: Zosyn 9/16>> Vancomycin 9/16>>   Devices    LINES / TUBES:      Continuous Infusions: . sodium chloride 75 mL/hr at 01/05/16 1210     Objective: Vitals:   01/04/16 1713  01/04/16 2130 01/05/16 0603 01/05/16 0846  BP: 127/68 123/65 132/64 (!) 142/68  Pulse: 87 80 85 82  Resp:  18 18   Temp:  99.7 F (37.6 C) (!) 100.8 F (38.2 C)   TempSrc:  Oral Oral   SpO2:  99% 98%   Weight:      Height:        Intake/Output Summary (Last 24 hours) at 01/05/16 1439 Last data filed at 01/05/16 0900  Gross per 24 hour  Intake          2538.75 ml  Output              600 ml  Net          1938.75 ml   Filed Weights   12/27/15 0136  Weight: 72.4 kg (159 lb 11.2 oz)    Examination:  General: A/O 4, NAD, No acute respiratory distress Eyes: negative scleral hemorrhage, negative anisocoria, negative icterus ENT: Negative Runny nose, negative gingival bleeding, Neck:  Negative scars, masses, torticollis, lymphadenopathy, JVD Lungs: Clear to auscultation bilaterally without wheezes or crackles, equal chest rise Cardiovascular: Regular rate and rhythm without murmur gallop or rub normal S1 and S2 Abdomen: negative abdominal pain, nondistended, positive soft, bowel sounds, no rebound, no ascites, no appreciable mass Extremities: right BKA draining brown purulent material from lesion on the medial aspect of stump. Left foot positive edema with dry gangrenous toes second and fourth, negative pain to palpation of stump or left foot  Psychiatric:  Negative depression, negative anxiety, negative fatigue, negative mania  Central nervous system:  No facial asymmetry, tongue/uvula midline,  sensation intact throughout, negative dysarthria, negative expressive aphasia, negative receptive aphasia.    Data Reviewed: Care during the described time interval was provided by me .  I have reviewed this patient's available data, including medical history, events of note, physical examination, and all test results as part of my evaluation.  CBC:  Recent Labs Lab 01/01/16 0411 01/02/16 0610 01/03/16 0537 01/04/16 0539 01/05/16 0459  WBC 13.4* 14.3* 13.3* 11.1* 10.8*  HGB 8.9*  9.0* 9.2* 8.2* 8.2*  HCT 27.7* 28.1* 29.0* 26.0* 25.3*  MCV 89.6 90.1 90.1 90.6 89.7  PLT 274 303 299 307 284   Basic Metabolic Panel:  Recent Labs Lab 12/31/15 0824 01/02/16 0610 01/03/16 0537  NA 130* 127* 130*  K 4.0 4.2 4.1  CL 104 103 104  CO2 20* 19* 20*  GLUCOSE 165* 156* 129*  BUN 14 16 14   CREATININE 1.20 1.27* 1.26*  CALCIUM 8.1* 8.1* 8.2*   GFR: Estimated Creatinine Clearance: 57.5 mL/min (by C-G formula based on SCr of 1.26 mg/dL (H)). Liver Function Tests:  Recent Labs Lab 12/31/15 0824  AST 50*  ALT 50  ALKPHOS 176*  BILITOT 0.5  PROT 6.3*  ALBUMIN 1.5*   No results for input(s): LIPASE, AMYLASE in the last 168 hours. No results for input(s): AMMONIA in the last 168 hours. Coagulation Profile:  Recent Labs Lab 01/01/16 0411 01/02/16 0610 01/03/16 0537 01/04/16 0539 01/05/16 0459  INR 2.84 1.46 1.40 1.44 1.42   Cardiac Enzymes: No results for input(s): CKTOTAL, CKMB, CKMBINDEX, TROPONINI in the last 168 hours. BNP (last 3 results) No results for input(s): PROBNP in the last 8760 hours. HbA1C: No results for input(s): HGBA1C in the last 72 hours. CBG:  Recent Labs Lab 01/04/16 1157 01/04/16 1656 01/04/16 2114 01/05/16 0802 01/05/16 1138  GLUCAP 158* 181* 163* 110* 148*   Lipid Profile: No results for input(s): CHOL, HDL, LDLCALC, TRIG, CHOLHDL, LDLDIRECT in the last 72 hours. Thyroid Function Tests: No results for input(s): TSH, T4TOTAL, FREET4, T3FREE, THYROIDAB in the last 72 hours. Anemia Panel: No results for input(s): VITAMINB12, FOLATE, FERRITIN, TIBC, IRON, RETICCTPCT in the last 72 hours. Sepsis Labs: No results for input(s): PROCALCITON, LATICACIDVEN in the last 168 hours.  Recent Results (from the past 240 hour(s))  Culture, blood (Routine x 2)     Status: None   Collection Time: 12/26/15  7:00 PM  Result Value Ref Range Status   Specimen Description BLOOD RIGHT FOREARM  Final   Special Requests BOTTLES DRAWN AEROBIC  AND ANAEROBIC 5CC  Final   Culture NO GROWTH 5 DAYS  Final   Report Status 12/31/2015 FINAL  Final  Culture, blood (Routine x 2)     Status: None   Collection Time: 12/26/15  7:08 PM  Result Value Ref Range Status   Specimen Description BLOOD LEFT FOREARM  Final   Special Requests BOTTLES DRAWN AEROBIC AND ANAEROBIC 5CC  Final   Culture NO GROWTH 5 DAYS  Final   Report Status 12/31/2015 FINAL  Final  Urine culture     Status: Abnormal   Collection Time: 12/26/15  7:42 PM  Result Value Ref Range Status   Specimen Description URINE, CATHETERIZED  Final   Special Requests NONE  Final   Culture 30,000 COLONIES/mL CANDIDA ALBICANS (A)  Final   Report Status 12/28/2015 FINAL  Final  C difficile quick scan w PCR reflex  Status: None   Collection Time: 12/27/15  1:05 AM  Result Value Ref Range Status   C Diff antigen NEGATIVE NEGATIVE Final   C Diff toxin NEGATIVE NEGATIVE Final   C Diff interpretation No C. difficile detected.  Final  Gastrointestinal Panel by PCR , Stool     Status: Abnormal   Collection Time: 12/27/15  1:05 AM  Result Value Ref Range Status   Campylobacter species NOT DETECTED NOT DETECTED Final   Plesimonas shigelloides NOT DETECTED NOT DETECTED Final   Salmonella species NOT DETECTED NOT DETECTED Final   Yersinia enterocolitica NOT DETECTED NOT DETECTED Final   Vibrio species NOT DETECTED NOT DETECTED Final   Vibrio cholerae NOT DETECTED NOT DETECTED Final   Enteroaggregative E coli (EAEC) NOT DETECTED NOT DETECTED Final   Enteropathogenic E coli (EPEC) DETECTED (A) NOT DETECTED Final    Comment: CRITICAL RESULT CALLED TO, READ BACK BY AND VERIFIED WITH: Ginger Gleeson @ 1354 12/28/15 by [facility]    Enterotoxigenic E coli (ETEC) NOT DETECTED NOT DETECTED Final   Shiga like toxin producing E coli (STEC) NOT DETECTED NOT DETECTED Final   Shigella/Enteroinvasive E coli (EIEC) NOT DETECTED NOT DETECTED Final   Cryptosporidium NOT DETECTED NOT DETECTED Final    Cyclospora cayetanensis NOT DETECTED NOT DETECTED Final   Entamoeba histolytica NOT DETECTED NOT DETECTED Final   Giardia lamblia NOT DETECTED NOT DETECTED Final   Adenovirus F40/41 NOT DETECTED NOT DETECTED Final   Astrovirus NOT DETECTED NOT DETECTED Final   Norovirus GI/GII NOT DETECTED NOT DETECTED Final   Rotavirus A NOT DETECTED NOT DETECTED Final   Sapovirus (I, II, IV, and V) NOT DETECTED NOT DETECTED Final  Aerobic Culture (superficial specimen)     Status: None   Collection Time: 12/29/15 10:57 AM  Result Value Ref Range Status   Specimen Description WOUND FOOT LEFT  Final   Special Requests Normal  Final   Gram Stain   Final    ABUNDANT WBC PRESENT, PREDOMINANTLY PMN RARE SQUAMOUS EPITHELIAL CELLS PRESENT ABUNDANT GRAM POSITIVE COCCI IN PAIRS IN CLUSTERS FEW GRAM POSITIVE RODS RARE GRAM NEGATIVE RODS    Culture   Final    ABUNDANT METHICILLIN RESISTANT STAPHYLOCOCCUS AUREUS MODERATE SERRATIA MARCESCENS    Report Status 12/31/2015 FINAL  Final   Organism ID, Bacteria METHICILLIN RESISTANT STAPHYLOCOCCUS AUREUS  Final   Organism ID, Bacteria SERRATIA MARCESCENS  Final      Susceptibility   Methicillin resistant staphylococcus aureus - MIC*    CIPROFLOXACIN >=8 RESISTANT Resistant     ERYTHROMYCIN >=8 RESISTANT Resistant     GENTAMICIN <=0.5 SENSITIVE Sensitive     OXACILLIN >=4 RESISTANT Resistant     TETRACYCLINE 2 SENSITIVE Sensitive     VANCOMYCIN <=0.5 SENSITIVE Sensitive     TRIMETH/SULFA <=10 SENSITIVE Sensitive     CLINDAMYCIN <=0.25 SENSITIVE Sensitive     RIFAMPIN <=0.5 SENSITIVE Sensitive     Inducible Clindamycin NEGATIVE Sensitive     * ABUNDANT METHICILLIN RESISTANT STAPHYLOCOCCUS AUREUS   Serratia marcescens - MIC*    CEFAZOLIN >=64 RESISTANT Resistant     CEFEPIME <=1 SENSITIVE Sensitive     CEFTAZIDIME <=1 SENSITIVE Sensitive     CEFTRIAXONE <=1 SENSITIVE Sensitive     CIPROFLOXACIN <=0.25 SENSITIVE Sensitive     GENTAMICIN <=1 SENSITIVE  Sensitive     TRIMETH/SULFA <=20 SENSITIVE Sensitive     * MODERATE SERRATIA MARCESCENS  Surgical pcr screen     Status: Abnormal   Collection Time: 01/03/16  1:50 AM  Result Value Ref Range Status   MRSA, PCR POSITIVE (A) NEGATIVE Final    Comment: RESULT CALLED TO, READ BACK BY AND VERIFIED WITH: N WYLIE,RN @0645  01/03/16 MKELLY,MLT    Staphylococcus aureus POSITIVE (A) NEGATIVE Final    Comment:        The Xpert SA Assay (FDA approved for NASAL specimens in patients over 78 years of age), is one component of a comprehensive surveillance program.  Test performance has been validated by [facility] for patients greater than or equal to 58 year old. It is not intended to diagnose infection nor to guide or monitor treatment.   Aerobic Culture (superficial specimen)     Status: None (Preliminary result)   Collection Time: 01/03/16 12:56 PM  Result Value Ref Range Status   Specimen Description ABSCESS LEG RIGHT  Final   Special Requests DISCHARGE RBKA  Final   Gram Stain   Final    RARE WBC PRESENT, PREDOMINANTLY PMN NO ORGANISMS SEEN    Culture NO GROWTH 2 DAYS  Final   Report Status PENDING  Incomplete         Radiology Studies: No results found.      Scheduled Meds: . amitriptyline  25 mg Oral QHS  . amLODipine  10 mg Oral Daily  . atorvastatin  40 mg Oral Daily  . carvedilol  6.25 mg Oral BID WC  . Chlorhexidine Gluconate Cloth  6 each Topical Q0600  . feeding supplement (GLUCERNA SHAKE)  237 mL Oral TID BM  . ferrous sulfate  325 mg Oral BID WC  . heparin subcutaneous  5,000 Units Subcutaneous Q8H  . hydrALAZINE  25 mg Oral TID  . insulin aspart  0-15 Units Subcutaneous TID WC  . insulin aspart  0-5 Units Subcutaneous QHS  . insulin glargine  17 Units Subcutaneous QHS  . levothyroxine  25 mcg Oral QAC breakfast  . multivitamin with minerals  1 tablet Oral Daily  . mupirocin ointment  1 application Nasal BID  . piperacillin-tazobactam (ZOSYN)  IV   3.375 g Intravenous Q8H  . pregabalin  100 mg Oral Daily   And  . pregabalin  150 mg Oral BID  . vancomycin  750 mg Intravenous Q12H   Continuous Infusions: . sodium chloride 75 mL/hr at 01/05/16 1210     LOS: 10 days    Time spent:35 min  Penny Pia, MD [facility] Pager [phone]  If 7PM-7AM, please contact night-coverage [email or web address] Password [facility] 01/05/2016, 2:39 PM

## 2016-01-06 LAB — AEROBIC CULTURE  (SUPERFICIAL SPECIMEN): CULTURE: NO GROWTH

## 2016-01-06 LAB — CBC
HCT: 29.8 % — ABNORMAL LOW (ref 39.0–52.0)
HEMATOCRIT: 24.5 % — AB (ref 39.0–52.0)
HEMOGLOBIN: 7.7 g/dL — AB (ref 13.0–17.0)
Hemoglobin: 9.8 g/dL — ABNORMAL LOW (ref 13.0–17.0)
MCH: 28.6 pg (ref 26.0–34.0)
MCH: 29.4 pg (ref 26.0–34.0)
MCHC: 31.4 g/dL (ref 30.0–36.0)
MCHC: 32.9 g/dL (ref 30.0–36.0)
MCV: 91.1 fL (ref 78.0–100.0)
MCV: 89.5 fL (ref 78.0–100.0)
Platelets: 288 10*3/uL (ref 150–400)
Platelets: 276 10*3/uL (ref 150–400)
RBC: 2.69 MIL/uL — AB (ref 4.22–5.81)
RBC: 3.33 MIL/uL — ABNORMAL LOW (ref 4.22–5.81)
RDW: 13.6 % (ref 11.5–15.5)
RDW: 13.6 % (ref 11.5–15.5)
WBC: 9.8 10*3/uL (ref 4.0–10.5)
WBC: 11.2 10*3/uL — ABNORMAL HIGH (ref 4.0–10.5)

## 2016-01-06 LAB — PROTIME-INR
INR: 1.41
Prothrombin Time: 17.3 seconds — ABNORMAL HIGH (ref 11.4–15.2)

## 2016-01-06 LAB — PREPARE RBC (CROSSMATCH)

## 2016-01-06 LAB — ABO/RH: ABO/RH(D): O POS

## 2016-01-06 LAB — GLUCOSE, CAPILLARY
GLUCOSE-CAPILLARY: 138 mg/dL — AB (ref 65–99)
GLUCOSE-CAPILLARY: 132 mg/dL — AB (ref 65–99)
GLUCOSE-CAPILLARY: 189 mg/dL — AB (ref 65–99)
Glucose-Capillary: 141 mg/dL — ABNORMAL HIGH (ref 65–99)

## 2016-01-06 LAB — AEROBIC CULTURE W GRAM STAIN (SUPERFICIAL SPECIMEN)

## 2016-01-06 MED ORDER — SODIUM CHLORIDE 0.9 % IV SOLN
Freq: Once | INTRAVENOUS | Status: AC
Start: 2016-01-06 — End: 2016-01-07
  Administered 2016-01-06: 08:00:00 via INTRAVENOUS

## 2016-01-06 NOTE — Progress Notes (Signed)
Patient received two units of blood today. Pre temp- 98.5, post temp-100.2. Current temp- 100. Physician notified.

## 2016-01-06 NOTE — Progress Notes (Signed)
Patient is still unsure about surgery and is not signing the informed consent form tonight. CHG bath done. Day nurse will be notified.

## 2016-01-06 NOTE — Progress Notes (Signed)
Attempted to have pt sign consent for surgical procedure scheduled for tomorrow. Pt stated he did not remember MD speaking with him about procedure. MD, Edilia Boickson, contacted and came to speak with pt. Per MD, pt is still not decided if he was going to follow through with procedure. Consent held until pt makes decision. Will report to oncoming nurse.
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
## 2016-01-06 NOTE — Progress Notes (Addendum)
   VASCULAR SURGERY ASSESSMENT & PLAN:  For Right AKA tomorrow by Dr. Arbie CookeyEarly.  Pt appears to understand and does not have any questions.   The patient is on Vanco and Zosyn  Patient significantly anemic. Hgb = 7.7. Will type and cross for 2 units and transfuse preop if OK with primary service.   SUBJECTIVE: No complaints.   PHYSICAL EXAM: Vitals:   01/05/16 1544 01/05/16 2051 01/05/16 2125 01/06/16 0631  BP: 133/62 133/79 120/75 135/77  Pulse:  75 79 81  Resp:  18 19 18   Temp:  99.1 F (37.3 C) 99.6 F (37.6 C) 99.2 F (37.3 C)  TempSrc:  Oral Oral Oral  SpO2:  100% 100% 99%  Weight:      Height:       Right BKA still with large fluctuant area.  Dressing reapplied.   LABS: Lab Results  Component Value Date   WBC 9.8 01/06/2016   HGB 7.7 (L) 01/06/2016   HCT 24.5 (L) 01/06/2016   MCV 91.1 01/06/2016   PLT 288 01/06/2016   Lab Results  Component Value Date   CREATININE 1.26 (H) 01/03/2016   Lab Results  Component Value Date   INR 1.41 01/06/2016   CBG (last 3)   Recent Labs  01/05/16 1645 01/05/16 2133 01/05/16 2209  GLUCAP 152* 158* 148*    Principal Problem:   Sepsis (HCC) Active Problems:   Hepatitis C without hepatic coma   Coronary artery disease due to lipid rich plaque   Type 2 diabetes mellitus with diabetic peripheral angiopathy without gangrene, without long-term current use of insulin (HCC)   History of CVA (cerebrovascular accident)   Genital lesion, male   AKI (acute kidney injury) (HCC)   Hypothyroidism, acquired   Essential hypertension   Sepsis, unspecified organism (HCC)   MRSA cellulitis of left foot   Serratia marcescens infection (HCC)   Escherichia coli infection   CAD in native artery   Diarrhea   Paroxysmal atrial fibrillation (HCC)   Uncontrolled type 2 diabetes mellitus with complication (HCC)    Ronald Simpson Beeper: [phone]: [phone] 01/06/2016

## 2016-01-07 ENCOUNTER — Inpatient Hospital Stay (HOSPITAL_COMMUNITY): Payer: Medicare Other | Admitting: Certified Registered"

## 2016-01-07 ENCOUNTER — Telehealth: Payer: Self-pay | Admitting: Vascular Surgery

## 2016-01-07 ENCOUNTER — Encounter (HOSPITAL_COMMUNITY)
Admission: EM | Disposition: A | Discharge status: Skilled Nursing Facility | Payer: Self-pay | Source: Home / Self Care | Attending: Internal Medicine

## 2016-01-07 HISTORY — PX: AMPUTATION: SHX166

## 2016-01-07 LAB — COMPREHENSIVE METABOLIC PANEL
ALBUMIN: 1.5 g/dL — AB (ref 3.5–5.0)
ALT: 19 U/L (ref 17–63)
ANION GAP: 4 — AB (ref 5–15)
AST: 28 U/L (ref 15–41)
Alkaline Phosphatase: 165 U/L — ABNORMAL HIGH (ref 38–126)
BILIRUBIN TOTAL: 0.8 mg/dL (ref 0.3–1.2)
BUN: 12 mg/dL (ref 6–20)
CO2: 21 mmol/L — AB (ref 22–32)
Calcium: 8.4 mg/dL — ABNORMAL LOW (ref 8.9–10.3)
Chloride: 107 mmol/L (ref 101–111)
Creatinine, Ser: 1.1 mg/dL (ref 0.61–1.24)
GFR calc Af Amer: >60 mL/min (ref >60)
GFR calc non Af Amer: >60 mL/min (ref >60)
GLUCOSE: 132 mg/dL — AB (ref 65–99)
POTASSIUM: 4.4 mmol/L (ref 3.5–5.1)
SODIUM: 132 mmol/L — AB (ref 135–145)
TOTAL PROTEIN: 6.9 g/dL (ref 6.5–8.1)

## 2016-01-07 LAB — GLUCOSE, CAPILLARY
GLUCOSE-CAPILLARY: 149 mg/dL — AB (ref 65–99)
GLUCOSE-CAPILLARY: 160 mg/dL — AB (ref 65–99)
GLUCOSE-CAPILLARY: 147 mg/dL — AB (ref 65–99)
Glucose-Capillary: 123 mg/dL — ABNORMAL HIGH (ref 65–99)

## 2016-01-07 LAB — PROTIME-INR
INR: 1.25
PROTHROMBIN TIME: 15.7 s — AB (ref 11.4–15.2)

## 2016-01-07 LAB — TYPE AND SCREEN
ABO/RH(D): O POS
Antibody Screen: NEGATIVE
UNIT DIVISION: 0
Unit division: 0

## 2016-01-07 LAB — CBC
HEMATOCRIT: 31.4 % — AB (ref 39.0–52.0)
HEMOGLOBIN: 10.2 g/dL — AB (ref 13.0–17.0)
MCH: 29.2 pg (ref 26.0–34.0)
MCHC: 32.5 g/dL (ref 30.0–36.0)
MCV: 90 fL (ref 78.0–100.0)
Platelets: 294 10*3/uL (ref 150–400)
RBC: 3.49 MIL/uL — ABNORMAL LOW (ref 4.22–5.81)
RDW: 13.8 % (ref 11.5–15.5)
WBC: 11.9 10*3/uL — AB (ref 4.0–10.5)

## 2016-01-07 SURGERY — AMPUTATION, ABOVE KNEE
Anesthesia: General | Site: Leg Upper | Laterality: Right

## 2016-01-07 MED ORDER — LIDOCAINE 2% (20 MG/ML) 5 ML SYRINGE
INTRAMUSCULAR | Status: AC
  Filled 2016-01-07: qty 5

## 2016-01-07 MED ORDER — HEPARIN SODIUM (PORCINE) 1000 UNIT/ML IJ SOLN
INTRAMUSCULAR | Status: AC
  Filled 2016-01-07: qty 1

## 2016-01-07 MED ORDER — ONDANSETRON HCL 4 MG/2ML IJ SOLN: 4.0000 mg | Freq: Once | INTRAMUSCULAR | Status: DC | PRN

## 2016-01-07 MED ORDER — EPHEDRINE 5 MG/ML INJ
INTRAVENOUS | Status: AC
  Filled 2016-01-07: qty 10

## 2016-01-07 MED ORDER — FENTANYL CITRATE (PF) 100 MCG/2ML IJ SOLN
INTRAMUSCULAR | Status: AC
  Filled 2016-01-07: qty 4

## 2016-01-07 MED ORDER — DOCUSATE SODIUM 100 MG PO CAPS
100.0000 mg | ORAL_CAPSULE | Freq: Every day | ORAL | Status: DC
Start: 2016-01-08 — End: 2016-01-11
  Administered 2016-01-08 – 2016-01-11 (×4): 100 mg via ORAL
  Filled 2016-01-07 (×6): qty 1

## 2016-01-07 MED ORDER — WARFARIN - PHARMACIST DOSING INPATIENT: Freq: Every day | Status: DC

## 2016-01-07 MED ORDER — PHENYLEPHRINE HCL 10 MG/ML IJ SOLN
INTRAVENOUS | Status: DC | PRN
  Administered 2016-01-07: 20 ug/min via INTRAVENOUS

## 2016-01-07 MED ORDER — WARFARIN SODIUM 5 MG PO TABS
10.0000 mg | ORAL_TABLET | Freq: Once | ORAL | Status: AC
  Administered 2016-01-07: 10 mg via ORAL
  Filled 2016-01-07: qty 2

## 2016-01-07 MED ORDER — PANTOPRAZOLE SODIUM 40 MG PO TBEC
40.0000 mg | DELAYED_RELEASE_TABLET | Freq: Every day | ORAL | Status: DC
  Administered 2016-01-07 – 2016-01-14 (×8): 40 mg via ORAL
  Filled 2016-01-07 (×9): qty 1

## 2016-01-07 MED ORDER — 0.9 % SODIUM CHLORIDE (POUR BTL) OPTIME
TOPICAL | Status: DC | PRN
  Administered 2016-01-07: 1000 mL

## 2016-01-07 MED ORDER — LACTATED RINGERS IV SOLN
INTRAVENOUS | Status: DC
  Administered 2016-01-07: 50 mL/h via INTRAVENOUS
  Administered 2016-01-07: 13:00:00 via INTRAVENOUS

## 2016-01-07 MED ORDER — PROTAMINE SULFATE 10 MG/ML IV SOLN
INTRAVENOUS | Status: AC
  Filled 2016-01-07: qty 5

## 2016-01-07 MED ORDER — PROPOFOL 10 MG/ML IV BOLUS
INTRAVENOUS | Status: DC | PRN
  Administered 2016-01-07: 100 mg via INTRAVENOUS

## 2016-01-07 MED ORDER — MORPHINE SULFATE (PF) 2 MG/ML IV SOLN
2.0000 mg | INTRAVENOUS | Status: DC | PRN
  Administered 2016-01-07 – 2016-01-13 (×24): 2 mg via INTRAVENOUS
  Filled 2016-01-07 (×25): qty 1

## 2016-01-07 MED ORDER — LACTATED RINGERS IV SOLN: INTRAVENOUS | Status: DC

## 2016-01-07 MED ORDER — SUGAMMADEX SODIUM 200 MG/2ML IV SOLN
INTRAVENOUS | Status: AC
  Filled 2016-01-07: qty 2

## 2016-01-07 MED ORDER — SUGAMMADEX SODIUM 200 MG/2ML IV SOLN
INTRAVENOUS | Status: DC | PRN
  Administered 2016-01-07: 150 mg via INTRAVENOUS

## 2016-01-07 MED ORDER — SODIUM CHLORIDE 0.9 % IV SOLN
INTRAVENOUS | Status: DC
  Administered 2016-01-07: 16:00:00 via INTRAVENOUS

## 2016-01-07 MED ORDER — HEPARIN SODIUM (PORCINE) 5000 UNIT/ML IJ SOLN
5000.0000 [IU] | Freq: Three times a day (TID) | INTRAMUSCULAR | Status: DC
  Administered 2016-01-08 – 2016-01-10 (×7): 5000 [IU] via SUBCUTANEOUS
  Filled 2016-01-07 (×7): qty 1

## 2016-01-07 MED ORDER — FENTANYL CITRATE (PF) 100 MCG/2ML IJ SOLN
INTRAMUSCULAR | Status: DC | PRN
  Administered 2016-01-07: 100 ug via INTRAVENOUS
  Administered 2016-01-07 (×2): 50 ug via INTRAVENOUS

## 2016-01-07 MED ORDER — FENTANYL CITRATE (PF) 100 MCG/2ML IJ SOLN
25.0000 ug | INTRAMUSCULAR | Status: DC | PRN
  Administered 2016-01-07 (×2): 25 ug via INTRAVENOUS
  Administered 2016-01-07: 50 ug via INTRAVENOUS

## 2016-01-07 MED ORDER — PHENOL 1.4 % MT LIQD
1.0000 | OROMUCOSAL | Status: DC | PRN
Start: 2016-01-07 — End: 2016-01-14

## 2016-01-07 MED ORDER — ROCURONIUM 10MG/ML (10ML) SYRINGE FOR MEDFUSION PUMP - OPTIME
INTRAVENOUS | Status: DC | PRN
  Administered 2016-01-07: 35 mg via INTRAVENOUS

## 2016-01-07 MED ORDER — ONDANSETRON HCL 4 MG/2ML IJ SOLN
INTRAMUSCULAR | Status: AC
  Filled 2016-01-07: qty 2

## 2016-01-07 MED ORDER — ROCURONIUM BROMIDE 10 MG/ML (PF) SYRINGE
PREFILLED_SYRINGE | INTRAVENOUS | Status: AC
  Filled 2016-01-07: qty 10

## 2016-01-07 MED ORDER — LIDOCAINE HCL (CARDIAC) 20 MG/ML IV SOLN
INTRAVENOUS | Status: DC | PRN
  Administered 2016-01-07: 100 mg via INTRATRACHEAL

## 2016-01-07 MED ORDER — PROPOFOL 10 MG/ML IV BOLUS
INTRAVENOUS | Status: AC
  Filled 2016-01-07: qty 20

## 2016-01-07 MED ORDER — FENTANYL CITRATE (PF) 100 MCG/2ML IJ SOLN
INTRAMUSCULAR | Status: AC
  Filled 2016-01-07: qty 2

## 2016-01-07 SURGICAL SUPPLY — 53 items
BANDAGE ACE 4X5 VEL STRL LF (GAUZE/BANDAGES/DRESSINGS) ×3 IMPLANT
BANDAGE ACE 6X5 VEL STRL LF (GAUZE/BANDAGES/DRESSINGS) ×3 IMPLANT
BANDAGE ELASTIC 6 VELCRO ST LF (GAUZE/BANDAGES/DRESSINGS) ×3 IMPLANT
BNDG GAUZE ELAST 4 BULKY (GAUZE/BANDAGES/DRESSINGS) ×3 IMPLANT
CANISTER SUCTION 2500CC (MISCELLANEOUS) ×3 IMPLANT
CLIP LIGATING EXTRA MED SLVR (CLIP) ×3 IMPLANT
CLIP LIGATING EXTRA SM BLUE (MISCELLANEOUS) ×3 IMPLANT
COVER SURGICAL LIGHT HANDLE (MISCELLANEOUS) ×6 IMPLANT
CUFF TOURNIQUET SINGLE 34IN LL (TOURNIQUET CUFF) IMPLANT
CUFF TOURNIQUET SINGLE 44IN (TOURNIQUET CUFF) IMPLANT
DRAIN SNY 10X20 3/4 PERF (WOUND CARE) IMPLANT
DRAPE INCISE IOBAN 66X45 STRL (DRAPES) ×3 IMPLANT
DRAPE ORTHO SPLIT 77X108 STRL (DRAPES) ×4
DRAPE PROXIMA HALF (DRAPES) ×3 IMPLANT
DRAPE SURG ORHT 6 SPLT 77X108 (DRAPES) ×2 IMPLANT
DRSG ADAPTIC 3X8 NADH LF (GAUZE/BANDAGES/DRESSINGS) ×3 IMPLANT
ELECT REM PT RETURN 9FT ADLT (ELECTROSURGICAL) ×3
ELECTRODE REM PT RTRN 9FT ADLT (ELECTROSURGICAL) ×1 IMPLANT
EVACUATOR SILICONE 100CC (DRAIN) IMPLANT
GAUZE SPONGE 4X4 12PLY STRL (GAUZE/BANDAGES/DRESSINGS) ×6 IMPLANT
GAUZE XEROFORM 5X9 LF (GAUZE/BANDAGES/DRESSINGS) ×3 IMPLANT
GLOVE BIO SURGEON STRL SZ 6.5 (GLOVE) ×4 IMPLANT
GLOVE BIO SURGEONS STRL SZ 6.5 (GLOVE) ×2
GLOVE BIOGEL PI IND STRL 6.5 (GLOVE) ×1 IMPLANT
GLOVE BIOGEL PI IND STRL 7.0 (GLOVE) ×2 IMPLANT
GLOVE BIOGEL PI INDICATOR 6.5 (GLOVE) ×2
GLOVE BIOGEL PI INDICATOR 7.0 (GLOVE) ×4
GLOVE ECLIPSE 6.5 STRL STRAW (GLOVE) ×3 IMPLANT
GLOVE SS BIOGEL STRL SZ 7.5 (GLOVE) ×1 IMPLANT
GLOVE SUPERSENSE BIOGEL SZ 7.5 (GLOVE) ×2
GOWN STRL REUS W/ TWL LRG LVL3 (GOWN DISPOSABLE) ×3 IMPLANT
GOWN STRL REUS W/TWL LRG LVL3 (GOWN DISPOSABLE) ×6
KIT BASIN OR (CUSTOM PROCEDURE TRAY) ×3 IMPLANT
KIT ROOM TURNOVER OR (KITS) ×3 IMPLANT
NS IRRIG 1000ML POUR BTL (IV SOLUTION) ×3 IMPLANT
PACK GENERAL/GYN (CUSTOM PROCEDURE TRAY) ×3 IMPLANT
PAD ARMBOARD 7.5X6 YLW CONV (MISCELLANEOUS) ×6 IMPLANT
PADDING CAST COTTON 6X4 STRL (CAST SUPPLIES) IMPLANT
RASP HELIOCORDIAL MED (MISCELLANEOUS) ×3 IMPLANT
SAW GIGLI STERILE 20 (MISCELLANEOUS) ×6 IMPLANT
SPONGE GAUZE 4X4 12PLY STER LF (GAUZE/BANDAGES/DRESSINGS) ×3 IMPLANT
STAPLER VISISTAT 35W (STAPLE) ×3 IMPLANT
STOCKINETTE IMPERVIOUS LG (DRAPES) ×3 IMPLANT
SUT ETHILON 3 0 PS 1 (SUTURE) IMPLANT
SUT VIC AB 0 CT1 18XCR BRD 8 (SUTURE) ×2 IMPLANT
SUT VIC AB 0 CT1 8-18 (SUTURE) ×4
SUT VICRYL AB 2 0 TIES (SUTURE) ×3 IMPLANT
SWAB COLLECTION DEVICE MRSA (MISCELLANEOUS) ×3 IMPLANT
SWAB CULTURE ESWAB REG 1ML (MISCELLANEOUS) ×3 IMPLANT
TOWEL OR 17X24 6PK STRL BLUE (TOWEL DISPOSABLE) ×3 IMPLANT
TOWEL OR 17X26 10 PK STRL BLUE (TOWEL DISPOSABLE) ×3 IMPLANT
UNDERPAD 30X30 (UNDERPADS AND DIAPERS) ×3 IMPLANT
WATER STERILE IRR 1000ML POUR (IV SOLUTION) ×3 IMPLANT

## 2016-01-07 NOTE — Telephone Encounter (Signed)
spoke with family member and left info for f/u appt 01/07/16 beg appt is 02/05/16 @ 8:45 am

## 2016-01-07 NOTE — Op Note (Signed)
    OPERATIVE REPORT  DATE OF SURGERY: 01/07/2016  PATIENT: Ronald Simpson, 70 y.o. male MRN: [id number]  DOB: [birth year]  PRE-OPERATIVE DIAGNOSIS: Nonhealing right below-knee amputation  POST-OPERATIVE DIAGNOSIS:  Same  PROCEDURE: Right above-knee amputation  SURGEON:  Gretta Beganodd Dyllan Kats, M.D.  PHYSICIAN ASSISTANT: Samantha Rhyne PA-C  ANESTHESIA:  Gen.  EBL: 100 ml  Total I/O In: 1000 [I.V.:1000] Out: 925 [Urine:825; Blood:100]  BLOOD ADMINISTERED: None  DRAINS: None  SPECIMEN: AKA specimen  COUNTS CORRECT:  YES  PLAN OF CARE: PACU   PATIENT DISPOSITION:  PACU - hemodynamically stable  PROCEDURE DETAILS: Patient's a 70 year old gentleman who is status post right [place]knee [place]. He presented 1 week ago to our hospital with the posterior draining from his right amputation stump. Apparently he had fallen and this had been reclosed. He did have palpable popliteal pulse. He had obvious gross infection with frank pus draining from an incision and tenderness at the popliteal space and in his muscle. His INR was markedly elevated due to Coumadin therapy. This was held. He initially was scheduled for surgery last week but to change his mind and refused surgery. He now consents. Understands there is no other option then above-knee amputation. Of note he does have an old history of a femoral rod and this is involved with the surgery as well. Plan would be to divide this with the bur at the time of surgery.  Patient was placed supine position where the area of the right leg prepped and draped in usual sterile fashion. A fishmouth incision was made on the distal femur just above the knee. This was carried down through the fascia with electrocautery. Patient had extensive edema and his stump. This was carried down to the femur and the periosteum was elevated proximally. The superficial femoral artery and femoral vein were ligated with 0 Vicryl  ties and divided. The saphenous vein was ligated with 0 Vicryl ties and divided. The sciatic nerve was ligated with 0 Vicryl ties and divided. The remaining muscle bodies were controlled with the electrocautery. The periosteum was elevated above the skin incision and a Gigli saw was used to cut through the bone down to the level of the metal rod. The specimen was loosened by using a Codman in the Gigli saw incision. The femur was loosened from the distal part of the bone rod and was specimen was passed off the field. The resulting expose rod was approximately 4-5 cm in length. This was divided using a bur drill. This was shortened to the level of the femur itself and the edges were smoothed to reassure that this would not cut the fascia. The wound was irrigated with saline and hemostasis tablet cautery. The anterior fascia was closed the posterior fascia with interrupted 0 Vicryl figure-of-eight sutures. The skin was closed with skin staples. Sterile dressing was applied the patient was transferred to the recovery room in stable condition   [name], M.D., [facility] 01/07/2016 2:06 PM

## 2016-01-07 NOTE — Anesthesia Postprocedure Evaluation (Signed)
Anesthesia Post Note  Patient: [name] Whitacre  Procedure(s) Performed: Procedure(s) (LRB): AMPUTATION ABOVE KNEE (Right)  Patient location during evaluation: PACU Anesthesia Type: General Level of consciousness: awake and alert Pain management: pain level controlled Vital Signs Assessment: post-procedure vital signs reviewed and stable Respiratory status: spontaneous breathing, nonlabored ventilation, respiratory function stable and patient connected to nasal cannula oxygen Cardiovascular status: blood pressure returned to baseline and stable Postop Assessment: no signs of nausea or vomiting Anesthetic complications: no    Last Vitals:  Vitals:   01/07/16 1400 01/07/16 1516  BP: (!) 159/86 (!) 160/82  Pulse:  88  Resp:  17  Temp:  36.7 C    Last Pain:  Vitals:   01/07/16 1628  TempSrc:   PainSc: 6                  Cecile HearingStephen Edward Bernerd Terhune

## 2016-01-07 NOTE — Progress Notes (Signed)
[facility]  PROGRESS NOTE  Ronald Simpson WJX:[id number] DOB: [birth year] DOA: 12/26/2015 PCP: [name], MD   Brief HPI:  70 y.o. with a history of CAD status post CABG in 2016, PVD s/p R BLA, history of CVA, diabetes mellitus type 2, hepatitis C, Pafib on warfarin. He presented with symptoms of malaise for 4 days. He was found to have sepsis, from the left foot.  Started on broad-spectrum antibiotics, ABI was nondiagnostic, GI pathogen panel was collected on admission due to initial diarrhea which came back positive for EPEC, diarrhea improved and resolved now. Left foot showed gangrenous changes in the second and fourth toe and subsequently she CT scan showed subcutaneous edema in left foot. he has history of R BKA in [place] in May earlier this year, now moved to [place] to be with his mother. Vascular surgery was consulted were, it was felt that he may need revision of his R BKA due to wound with purulence near stump and arteriogram to evaluate his left leg -gangrenous toes. Pt has gone back and forth with his discussions with vascular surgery and plan at this time is finally to undergo R AKA, await VVS plans regarding Arteriogram of LLE   Subjective   Feels ok, wondering when he will have the dye test   Assessment/Plan:   1. Sepsis- likely from cellulitis, also has wound with purulence at R BKA stump and gangrenous changes in 2 toes of L foot       -CT with subcutaneous edema around ankle tracking in the dorsum of the foot and along the ball of the foot.       -ABI inconclusive due to non compressible vessels       -previous BKA in [place] in May       -Wound Cx from L foot wound: polymicrobial including MRSA and Seratia marcescens       -on IV Vancomycin and Zosyn-Day 12       -family/Neice reported that he was just referred to VVS prior to admission       -Arterial Duplex from [facility] on 9/7 in Care [facility]: notes hemodynamically significant  stenosis in L superficial fem artery/anterior tibial and proximal R SFA      -appreciate VVS consult, plan for R AKA today and will need arteriogram planned for LLE per VVS-will d/w Dr.Early      -warfarin on hold  1. EPEC  Diarrhea     -diarrhea improved,  GI pathogen panel showed enteropathogenic Escherichia coli.      -supportive care  3.   History of CAD- stable, continue Coreg  2. Hypertension- -stable  3. Hypothyroidism-continue Synthroid  4. History of hepatitis C - Cirrhotic appearance of liver on CT and albumin is 1.6  5. Diabetes mellitus- - continue Lantus and sliding scale insulin with NovoLog. Blood glucose moderately controlled.  7.   Paroxysmal Afib - On anticoagulation with warfarin --pt and family unaware why he was on warfarin, I was able to contact his PCP (Dr.Schneider) who was able to review his Cardiologists note from EMR from 2015 which notes P.Afib as reason for anticoagulation -warfarin on hold for vascular surgery  DVT prophylaxis: On Heparin for DVT proph, warfarin on hold  Code Status: Full code Family Communication: No family at bedside, called and d/w niece [name] 9/19, 9/20 Disposition Plan: pending Surgical plans   Consultants:  VVS   Procedures:  None  Antibiotics:   Anti-infectives    Start     Dose/Rate  Route Frequency Ordered Stop   01/03/16 0600  cefUROXime (ZINACEF) 1.5 g in dextrose 5 % 50 mL IVPB     1.5 g 100 mL/hr over 30 Minutes Intravenous On call to O.R. 01/02/16 1555 01/04/16 0559   01/01/16 0600  [MAR Hold]  vancomycin (VANCOCIN) IVPB 750 mg/150 ml premix     (MAR Hold since 01/07/16 0958)   750 mg 150 mL/hr over 60 Minutes Intravenous Every 12 hours 12/31/15 2047     12/30/15 0600  vancomycin (VANCOCIN) IVPB 1000 mg/200 mL premix  Status:  Discontinued     1,000 mg 200 mL/hr over 60 Minutes Intravenous Every 12 hours 12/29/15 1505 12/31/15 2047   12/29/15 1800  vancomycin (VANCOCIN) IVPB 1000 mg/200 mL premix      1,000 mg 200 mL/hr over 60 Minutes Intravenous  Once 12/29/15 1505 12/29/15 1834   12/29/15 1600  [MAR Hold]  piperacillin-tazobactam (ZOSYN) IVPB 3.375 g     (MAR Hold since 01/07/16 0958)   3.375 g 12.5 mL/hr over 240 Minutes Intravenous Every 8 hours 12/29/15 1505     12/27/15 2200  vancomycin (VANCOCIN) IVPB 1000 mg/200 mL premix  Status:  Discontinued     1,000 mg 200 mL/hr over 60 Minutes Intravenous Every 24 hours 12/27/15 0146 12/27/15 0339   12/27/15 1000  vancomycin (VANCOCIN) 50 mg/mL oral solution 125 mg  Status:  Discontinued     125 mg Oral 4 times daily 12/27/15 0053 12/27/15 0327   12/27/15 0600  piperacillin-tazobactam (ZOSYN) IVPB 3.375 g  Status:  Discontinued     3.375 g 12.5 mL/hr over 240 Minutes Intravenous Every 8 hours 12/27/15 0146 12/27/15 0339   12/27/15 0600  cefTRIAXone (ROCEPHIN) 2 g in dextrose 5 % 50 mL IVPB  Status:  Discontinued     2 g 100 mL/hr over 30 Minutes Intravenous Every 24 hours 12/27/15 0339 12/28/15 1830   12/26/15 2330  piperacillin-tazobactam (ZOSYN) IVPB 3.375 g     3.375 g 100 mL/hr over 30 Minutes Intravenous  Once 12/26/15 2318 12/27/15 0030   12/26/15 2100  vancomycin (VANCOCIN) IVPB 1000 mg/200 mL premix     1,000 mg 200 mL/hr over 60 Minutes Intravenous  Once 12/26/15 2046 12/26/15 2348       Objective   Vitals:   01/06/16 1431 01/06/16 1653 01/06/16 2122 01/07/16 0530  BP: (!) 150/68 (!) 142/67 134/66 139/70  Pulse: 88 80 84 83  Resp: 16 18  20   Temp: 98.5 F (36.9 C) 100.2 F (37.9 C) 99.5 F (37.5 C) 98.7 F (37.1 C)  TempSrc: Oral Oral Oral Oral  SpO2: 100% 100% 99% 90%  Weight:      Height:        Intake/Output Summary (Last 24 hours) at 01/07/16 1251 Last data filed at 01/07/16 1245  Gross per 24 hour  Intake             3531 ml  Output             1700 ml  Net             1831 ml   Filed Weights   12/27/15 0136  Weight: 72.4 kg (159 lb 11.2 oz)     Physical Examination:  General exam: Appears  calm and comfortable, AAOx2, no distress, chronically ill Respiratory system: Clear to auscultation. Respiratory effort normal. Cardiovascular system:  RRR. No  murmurs, rubs, gallops. No pedal edema. GI system: Abdomen is nondistended, soft and nontender. No organomegaly.  Central nervous system. No focal neurological deficits. Musculoskeletal- status post right below-knee amputation Skin: Dry gangrene noted on the medial aspect of the second and fourth toe, pus noted was in from black eschar on deep palpation. Tender to palpation R BKA noted and ulcer at stump with purulence Psychiatry: Alert, oriented x 3.Judgement and insight appear normal. Affect normal.    Data Reviewed: I have personally reviewed following labs and imaging studies  CBG:  Recent Labs Lab 01/06/16 0750 01/06/16 1156 01/06/16 1646 01/06/16 2122 01/07/16 0903  GLUCAP 141* 138* 132* 189* 123*    CBC:  Recent Labs Lab 01/04/16 0539 01/05/16 0459 01/06/16 0512 01/06/16 1913 01/07/16 0732  WBC 11.1* 10.8* 9.8 11.2* 11.9*  HGB 8.2* 8.2* 7.7* 9.8* 10.2*  HCT 26.0* 25.3* 24.5* 29.8* 31.4*  MCV 90.6 89.7 91.1 89.5 90.0  PLT 307 284 288 276 294    Basic Metabolic Panel:  Recent Labs Lab 01/02/16 0610 01/03/16 0537 01/07/16 0732  NA 127* 130* 132*  K 4.2 4.1 4.4  CL 103 104 107  CO2 19* 20* 21*  GLUCOSE 156* 129* 132*  BUN 16 14 12   CREATININE 1.27* 1.26* 1.10  CALCIUM 8.1* 8.2* 8.4*    Recent Results (from the past 240 hour(s))  Aerobic Culture (superficial specimen)     Status: None   Collection Time: 12/29/15 10:57 AM  Result Value Ref Range Status   Specimen Description WOUND FOOT LEFT  Final   Special Requests Normal  Final   Gram Stain   Final    ABUNDANT WBC PRESENT, PREDOMINANTLY PMN RARE SQUAMOUS EPITHELIAL CELLS PRESENT ABUNDANT GRAM POSITIVE COCCI IN PAIRS IN CLUSTERS FEW GRAM POSITIVE RODS RARE GRAM NEGATIVE RODS    Culture   Final    ABUNDANT METHICILLIN RESISTANT  STAPHYLOCOCCUS AUREUS MODERATE SERRATIA MARCESCENS    Report Status 12/31/2015 FINAL  Final   Organism ID, Bacteria METHICILLIN RESISTANT STAPHYLOCOCCUS AUREUS  Final   Organism ID, Bacteria SERRATIA MARCESCENS  Final      Susceptibility   Methicillin resistant staphylococcus aureus - MIC*    CIPROFLOXACIN >=8 RESISTANT Resistant     ERYTHROMYCIN >=8 RESISTANT Resistant     GENTAMICIN <=0.5 SENSITIVE Sensitive     OXACILLIN >=4 RESISTANT Resistant     TETRACYCLINE 2 SENSITIVE Sensitive     VANCOMYCIN <=0.5 SENSITIVE Sensitive     TRIMETH/SULFA <=10 SENSITIVE Sensitive     CLINDAMYCIN <=0.25 SENSITIVE Sensitive     RIFAMPIN <=0.5 SENSITIVE Sensitive     Inducible Clindamycin NEGATIVE Sensitive     * ABUNDANT METHICILLIN RESISTANT STAPHYLOCOCCUS AUREUS   Serratia marcescens - MIC*    CEFAZOLIN >=64 RESISTANT Resistant     CEFEPIME <=1 SENSITIVE Sensitive     CEFTAZIDIME <=1 SENSITIVE Sensitive     CEFTRIAXONE <=1 SENSITIVE Sensitive     CIPROFLOXACIN <=0.25 SENSITIVE Sensitive     GENTAMICIN <=1 SENSITIVE Sensitive     TRIMETH/SULFA <=20 SENSITIVE Sensitive     * MODERATE SERRATIA MARCESCENS  Surgical pcr screen     Status: Abnormal   Collection Time: 01/03/16  1:50 AM  Result Value Ref Range Status   MRSA, PCR POSITIVE (A) NEGATIVE Final    Comment: RESULT CALLED TO, READ BACK BY AND VERIFIED WITH: N WYLIE,RN @0645  01/03/16 MKELLY,MLT    Staphylococcus aureus POSITIVE (A) NEGATIVE Final    Comment:        The Xpert SA Assay (FDA approved for NASAL specimens in patients over 54 years of age), is one  component of a comprehensive surveillance program.  Test performance has been validated by [facility] for patients greater than or equal to 68 year old. It is not intended to diagnose infection nor to guide or monitor treatment.   Aerobic Culture (superficial specimen)     Status: None   Collection Time: 01/03/16 12:56 PM  Result Value Ref Range Status   Specimen  Description ABSCESS LEG RIGHT  Final   Special Requests DISCHARGE RBKA  Final   Gram Stain   Final    RARE WBC PRESENT, PREDOMINANTLY PMN NO ORGANISMS SEEN    Culture NO GROWTH 2 DAYS  Final   Report Status 01/06/2016 FINAL  Final     Liver Function Tests:  Recent Labs Lab 01/07/16 0732  AST 28  ALT 19  ALKPHOS 165*  BILITOT 0.8  PROT 6.9  ALBUMIN 1.5*   No results for input(s): LIPASE, AMYLASE in the last 168 hours. No results for input(s): AMMONIA in the last 168 hours.  Cardiac Enzymes: No results for input(s): CKTOTAL, CKMB, CKMBINDEX, TROPONINI in the last 168 hours. BNP (last 3 results) No results for input(s): BNP in the last 8760 hours.  ProBNP (last 3 results) No results for input(s): PROBNP in the last 8760 hours.    Studies: No results found.  Scheduled Meds: . [MAR Hold] amitriptyline  25 mg Oral QHS  . [MAR Hold] amLODipine  10 mg Oral Daily  . [MAR Hold] atorvastatin  40 mg Oral Daily  . [MAR Hold] carvedilol  6.25 mg Oral BID WC  . Chlorhexidine Gluconate Cloth  6 each Topical Q0600  . [MAR Hold] feeding supplement (GLUCERNA SHAKE)  237 mL Oral TID BM  . [MAR Hold] ferrous sulfate  325 mg Oral BID WC  . [MAR Hold] heparin subcutaneous  5,000 Units Subcutaneous Q8H  . [MAR Hold] hydrALAZINE  25 mg Oral TID  . [MAR Hold] insulin aspart  0-15 Units Subcutaneous TID WC  . [MAR Hold] insulin aspart  0-5 Units Subcutaneous QHS  . [MAR Hold] insulin glargine  17 Units Subcutaneous QHS  . [MAR Hold] levothyroxine  25 mcg Oral QAC breakfast  . [MAR Hold] multivitamin with minerals  1 tablet Oral Daily  . [MAR Hold] mupirocin ointment  1 application Nasal BID  . [MAR Hold] piperacillin-tazobactam (ZOSYN)  IV  3.375 g Intravenous Q8H  . [MAR Hold] pregabalin  100 mg Oral Daily   And  . [MAR Hold] pregabalin  150 mg Oral BID  . [MAR Hold] vancomycin  750 mg Intravenous Q12H    Continuous Infusions: . lactated ringers 50 mL/hr (01/07/16 1032)  .  lactated ringers      Time spent: 25 min  Sabella Traore   [facility] Pager [phone] If 7PM-7AM, please contact night-coverage at [email or web address], Office  [phone]  password [email or web address] 01/07/2016, 12:51 PM  LOS: 12 days

## 2016-01-07 NOTE — Transfer of Care (Signed)
Immediate Anesthesia Transfer of Care Note  Patient: [name] Huaracha  Procedure(s) Performed: Procedure(s): AMPUTATION ABOVE KNEE (Right)  Patient Location: PACU  Anesthesia Type:General  Level of Consciousness: awake, alert , oriented and patient cooperative  Airway & Oxygen Therapy: Patient Spontanous Breathing  Post-op Assessment: Report given to RN and Post -op Vital signs reviewed and stable  Post vital signs: Reviewed and stable  Last Vitals:  Vitals:   01/06/16 2122 01/07/16 0530  BP: 134/66 139/70  Pulse: 84 83  Resp:  20  Temp: 37.5 C 37.1 C    Last Pain:  Vitals:   01/07/16 0957  TempSrc:   PainSc: 2       Patients Stated Pain Goal: 2 (01/07/16 0957)  Complications: No apparent anesthesia complications

## 2016-01-07 NOTE — Progress Notes (Signed)
Pharmacy Antibiotic Note  Ronald Simpson is a 70 y.o. male admitted on 12/26/2015 with sepsis related to a wound infection. Pharmacy was consulted for vancomycin and Zosyn dosing. Patient was positive for enteropathogenic E. coli on his GI panel.    Day #10 of broad spectrum abx for infectious gastroenteritis / infected left foot wound. Dry gangrene noted on left second and fourth toes. He had right AKA today. Unable to check a vancomycin trough today due to OR staff giving 18:00 dose at 11:45 - will await steady-state level again. SCr improved.  Plan: Continue vancomycin 750 mg IV q12h Continue Zosyn 3.375 gm IV q8h (4 hour infusion) Monitor clinical picture and renal function F/U abx deescalation / LOT planned  Height: 5\' 10"  (177.8 cm) Weight: 159 lb 11.2 oz (72.4 kg) IBW/kg (Calculated) : 73  Temp (24hrs), Avg:99 F (37.2 C), Min:98.1 F (36.7 C), Max:100.2 F (37.9 C)   Recent Labs Lab 01/02/16 0610 01/03/16 0537 01/03/16 1752 01/04/16 0539 01/05/16 0459 01/06/16 0512 01/06/16 1913 01/07/16 0732  WBC 14.3* 13.3*  --  11.1* 10.8* 9.8 11.2* 11.9*  CREATININE 1.27* 1.26*  --   --   --   --   --  1.10  VANCOTROUGH  --   --  38* 17  --   --   --   --     Estimated Creatinine Clearance: 65.8 mL/min (by C-G formula based on SCr of 1.1 mg/dL).    Not on File  Antimicrobials this admission: Rocephin 9/14  >> 9/15 Vancomycin 9/13 x 1, 9/16 >> Zosyn 9/13 x 1, 9/16 >>  Levels/Adjustments: 9/18 VT 20 (drawn 1 hr late) 9/21 VT 38 (drawn while vancomycin was infusing) 9/22 VT 17  Microbiology results: 9/13 BCx: ngF 9/13 UCx: 30,000 candida albicans 9/15 C-diff neg 9/15- GI panel showed enteropathic ecoli 9/16 Wound culture: MRSA & serratia marcescens (R-ancef) 9/21 Leg abscess: neg 9/25 R stump wound:  Thank you for allowing pharmacy to be a part of this patient's care.  Loura BackJennifer Benedict, PharmD, BCPS Clinical Pharmacist Phone for today [phone] Main pharmacy -  [phone] 01/07/2016 4:20 PM

## 2016-01-07 NOTE — Anesthesia Procedure Notes (Signed)
Procedure Name: Intubation Date/Time: 01/07/2016 11:27 AM Performed by: Rosiland OzMEYERS, Marlon Vonruden Pre-anesthesia Checklist: Emergency Drugs available, Patient identified, Timeout performed, Patient being monitored and Suction available Patient Re-evaluated:Patient Re-evaluated prior to inductionOxygen Delivery Method: Circle system utilized Preoxygenation: Pre-oxygenation with 100% oxygen Intubation Type: IV induction Ventilation: Mask ventilation without difficulty and Oral airway inserted - appropriate to patient size Laryngoscope Size: Hyacinth MeekerMiller and 2 Grade View: Grade I Tube type: Oral Tube size: 7.5 mm Number of attempts: 1 Airway Equipment and Method: Stylet Placement Confirmation: ETT inserted through vocal cords under direct vision,  positive ETCO2 and breath sounds checked- equal and bilateral Secured at: 24 cm Tube secured with: Tape Dental Injury: Teeth and Oropharynx as per pre-operative assessment

## 2016-01-07 NOTE — Telephone Encounter (Signed)
-----   Message from Sharee PimpleMarilyn K McChesney, RN sent at 01/07/2016  2:38 PM EDT ----- Regarding: 4 weeks    ----- Message ----- From: Dara LordsSamantha J Rhyne, PA-C Sent: 01/07/2016   1:16 PM To: Vvs Charge Pool  S/p right AKA 01/07/16.  F/u with Dr. Arbie CookeyEarly in 4 weeks.  Thanks, Lelon MastSamantha

## 2016-01-07 NOTE — Progress Notes (Signed)
ANTICOAGULATION CONSULT NOTE - Initial Consult  Pharmacy Consult for Coumadin Indication: atrial fibrillation / stroke history  Not on File  Patient Measurements: Height: 5\' 10"  (177.8 cm) Weight: 159 lb 11.2 oz (72.4 kg) IBW/kg (Calculated) : 73   Vital Signs: Temp: 98.1 F (36.7 C) (09/25 1516) Temp Source: Oral (09/25 1516) BP: 160/82 (09/25 1516) Pulse Rate: 88 (09/25 1516)  Labs:  Recent Labs  01/05/16 0459 01/06/16 0512 01/06/16 1913 01/07/16 0732  HGB 8.2* 7.7* 9.8* 10.2*  HCT 25.3* 24.5* 29.8* 31.4*  PLT 284 288 276 294  LABPROT 17.5* 17.3*  --  15.7*  INR 1.42 1.41  --  1.25  CREATININE  --   --   --  1.10    Estimated Creatinine Clearance: 65.8 mL/min (by C-G formula based on SCr of 1.1 mg/dL).   Medical History: Past Medical History:  Diagnosis Date  . CAD (coronary artery disease)   . CVA (cerebral infarction)   . Diabetes mellitus without complication (HCC)   . History of hepatitis B   . History of hepatitis C   . Peripheral vascular disease [facility](HCC)     Assessment: 70 year old male on Coumadin PTA for Afib and stroke history.  Now s/p AKA and to resume Coumadin tonight  Dose PTA = 7 mg po daily Received 5 mg of vitamin K 9/19  Goal of Therapy:  INR 2-3 Monitor platelets by anticoagulation protocol: Yes   Plan:  Coumadin 10 mg po x 1 dose  Daily INR  Thank you Okey RegalLisa Morene Cecilio, PharmD [phone]  01/07/2016,4:21 PM

## 2016-01-07 NOTE — Anesthesia Preprocedure Evaluation (Addendum)
Anesthesia Evaluation  Patient identified by MRN, date of birth, ID band Patient awake    Reviewed: Allergy & Precautions, NPO status , Patient's Chart, lab work & pertinent test results  History of Anesthesia Complications Negative for: history of anesthetic complications  Airway Mallampati: II  TM Distance: >3 FB Neck ROM: Full    Dental  (+) Dental Advisory Given, Partial Lower, Partial Upper   Pulmonary Current Smoker,    Pulmonary exam normal breath sounds clear to auscultation       Cardiovascular hypertension, Pt. on medications and Pt. on home beta blockers + CAD, + CABG and + Peripheral Vascular Disease  Normal cardiovascular exam+ dysrhythmias Atrial Fibrillation  Rhythm:Regular Rate:Normal     Neuro/Psych CVA, No Residual Symptoms negative psych ROS   GI/Hepatic negative GI ROS, (+) Hepatitis -, C, B  Endo/Other  diabetes, Type 2, Insulin DependentHypothyroidism   Renal/GU Renal disease (AKI)     Musculoskeletal negative musculoskeletal ROS (+)   Abdominal   Peds  Hematology  (+) Blood dyscrasia (Warfarin), anemia ,   Anesthesia Other Findings Day of surgery medications reviewed with the patient.  Reproductive/Obstetrics                            Anesthesia Physical Anesthesia Plan  ASA: III  Anesthesia Plan: General   Post-op Pain Management:    Induction: Intravenous  Airway Management Planned: Oral ETT  Additional Equipment:   Intra-op Plan:   Post-operative Plan: Possible Post-op intubation/ventilation and Extubation in OR  Informed Consent: I have reviewed the patients History and Physical, chart, labs and discussed the procedure including the risks, benefits and alternatives for the proposed anesthesia with the patient or authorized representative who has indicated his/her understanding and acceptance.   Dental advisory given  Plan Discussed with:  CRNA  Anesthesia Plan Comments: (Risks/benefits of general anesthesia discussed with patient including risk of damage to teeth, lips, gum, and tongue, nausea/vomiting, allergic reactions to medications, and the possibility of heart attack, stroke and death.  All patient questions answered.  Patient wishes to proceed.)       Anesthesia Quick Evaluation

## 2016-01-08 ENCOUNTER — Encounter (HOSPITAL_COMMUNITY): Payer: Self-pay | Admitting: Vascular Surgery

## 2016-01-08 ENCOUNTER — Encounter: Payer: Self-pay | Admitting: Vascular Surgery

## 2016-01-08 DIAGNOSIS — R000 Tachycardia, unspecified: Secondary | ICD-10-CM

## 2016-01-08 DIAGNOSIS — E1165 Type 2 diabetes mellitus with hyperglycemia: Secondary | ICD-10-CM

## 2016-01-08 DIAGNOSIS — T148XXA Other injury of unspecified body region, initial encounter: Secondary | ICD-10-CM

## 2016-01-08 DIAGNOSIS — E46 Unspecified protein-calorie malnutrition: Secondary | ICD-10-CM

## 2016-01-08 DIAGNOSIS — I2583 Coronary atherosclerosis due to lipid rich plaque: Secondary | ICD-10-CM

## 2016-01-08 DIAGNOSIS — E1159 Type 2 diabetes mellitus with other circulatory complications: Secondary | ICD-10-CM

## 2016-01-08 DIAGNOSIS — E871 Hypo-osmolality and hyponatremia: Secondary | ICD-10-CM

## 2016-01-08 DIAGNOSIS — A09 Infectious gastroenteritis and colitis, unspecified: Secondary | ICD-10-CM

## 2016-01-08 DIAGNOSIS — A498 Other bacterial infections of unspecified site: Secondary | ICD-10-CM

## 2016-01-08 DIAGNOSIS — D72829 Elevated white blood cell count, unspecified: Secondary | ICD-10-CM

## 2016-01-08 DIAGNOSIS — L03119 Cellulitis of unspecified part of limb: Secondary | ICD-10-CM

## 2016-01-08 DIAGNOSIS — L02619 Cutaneous abscess of unspecified foot: Secondary | ICD-10-CM

## 2016-01-08 DIAGNOSIS — T148 Other injury of unspecified body region: Secondary | ICD-10-CM

## 2016-01-08 DIAGNOSIS — G8918 Other acute postprocedural pain: Secondary | ICD-10-CM

## 2016-01-08 DIAGNOSIS — I798 Other disorders of arteries, arterioles and capillaries in diseases classified elsewhere: Secondary | ICD-10-CM

## 2016-01-08 DIAGNOSIS — F17200 Nicotine dependence, unspecified, uncomplicated: Secondary | ICD-10-CM

## 2016-01-08 DIAGNOSIS — I739 Peripheral vascular disease, unspecified: Secondary | ICD-10-CM

## 2016-01-08 DIAGNOSIS — Z720 Tobacco use: Secondary | ICD-10-CM

## 2016-01-08 DIAGNOSIS — I25810 Atherosclerosis of coronary artery bypass graft(s) without angina pectoris: Secondary | ICD-10-CM

## 2016-01-08 DIAGNOSIS — L089 Local infection of the skin and subcutaneous tissue, unspecified: Secondary | ICD-10-CM

## 2016-01-08 DIAGNOSIS — E8809 Other disorders of plasma-protein metabolism, not elsewhere classified: Secondary | ICD-10-CM

## 2016-01-08 LAB — CBC
HEMATOCRIT: 34.7 % — AB (ref 39.0–52.0)
HEMOGLOBIN: 11.1 g/dL — AB (ref 13.0–17.0)
MCH: 29.2 pg (ref 26.0–34.0)
MCHC: 32 g/dL (ref 30.0–36.0)
MCV: 91.3 fL (ref 78.0–100.0)
Platelets: 334 10*3/uL (ref 150–400)
RBC: 3.8 MIL/uL — ABNORMAL LOW (ref 4.22–5.81)
RDW: 13.7 % (ref 11.5–15.5)
WBC: 18.4 10*3/uL — ABNORMAL HIGH (ref 4.0–10.5)

## 2016-01-08 LAB — PROTIME-INR
INR: 1.61
PROTHROMBIN TIME: 19.4 s — AB (ref 11.4–15.2)

## 2016-01-08 LAB — BASIC METABOLIC PANEL
Anion gap: 7 (ref 5–15)
BUN: 12 mg/dL (ref 6–20)
CALCIUM: 8.5 mg/dL — AB (ref 8.9–10.3)
CO2: 19 mmol/L — ABNORMAL LOW (ref 22–32)
CREATININE: 1.29 mg/dL — AB (ref 0.61–1.24)
Chloride: 105 mmol/L (ref 101–111)
GFR calc Af Amer: >60 mL/min (ref >60)
GFR, EST NON AFRICAN AMERICAN: 55 mL/min — AB (ref >60)
GLUCOSE: 151 mg/dL — AB (ref 65–99)
Potassium: 4.5 mmol/L (ref 3.5–5.1)
SODIUM: 131 mmol/L — AB (ref 135–145)

## 2016-01-08 LAB — GLUCOSE, CAPILLARY
GLUCOSE-CAPILLARY: 154 mg/dL — AB (ref 65–99)
Glucose-Capillary: 158 mg/dL — ABNORMAL HIGH (ref 65–99)
Glucose-Capillary: 144 mg/dL — ABNORMAL HIGH (ref 65–99)
Glucose-Capillary: 183 mg/dL — ABNORMAL HIGH (ref 65–99)

## 2016-01-08 MED ORDER — DEXTROSE 5 % IV SOLN
2.0000 g | INTRAVENOUS | Status: DC
  Administered 2016-01-08 – 2016-01-12 (×5): 2 g via INTRAVENOUS
  Filled 2016-01-08 (×6): qty 2

## 2016-01-08 NOTE — [place] FL2 (Signed)
[place] MEDICAID FL2 LEVEL OF CARE SCREENING TOOL     IDENTIFICATION  Patient Name: Ronald Simpson Birthdate: 1945/11/13 Sex: male Admission Date (Current Location): 12/26/2015  [facility] and [place] Number:  [employer] and Address:  [facility], [address] [place], [place] [address]      Provider Number: [id number]  Attending Physician Name and Address:  Zannie Cove, MD  Relative Name and Phone Number:       Current Level of Care: Hospital Recommended Level of Care: Skilled Nursing Facility Prior Approval Number:    Date Approved/Denied:   PASRR Number:    Discharge Plan: SNF    Current Diagnoses: Patient Active Problem List   Diagnosis Date Noted  . Infected wound (HCC)   . 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 Tobacco abuse   . PVD (peripheral vascular disease) (HCC)   . Post-operative pain   . Cellulitis of foot   . Tachycardia   . Hyponatremia   . Hypoalbuminemia   . Leukocytosis   . Sepsis, unspecified organism (HCC)   . MRSA cellulitis of left foot   . Serratia marcescens infection (HCC)   . Escherichia coli infection   . CAD in native artery   . Diarrhea   . Paroxysmal atrial fibrillation (HCC)   . Uncontrolled type 2 diabetes mellitus with complication (HCC)   . Hepatitis C without hepatic coma 12/27/2015  . Coronary artery disease due to lipid rich plaque 12/27/2015  . Type 2 diabetes mellitus with diabetic peripheral angiopathy without gangrene, without long-term current use of insulin (HCC) 12/27/2015  . History of CVA (cerebrovascular accident) 12/27/2015  . Genital lesion, male 12/27/2015  . AKI (acute kidney injury) (HCC) 12/27/2015  . Hypothyroidism, acquired 12/27/2015  . Benign essential HTN 12/27/2015  . Sepsis (HCC) 12/26/2015    Orientation RESPIRATION BLADDER Height & Weight     Self, Time, Situation, Place  Normal Continent  Weight: 72.4 kg (159 lb 11.2 oz) Height:  5\' 10"  (177.8 cm)  BEHAVIORAL SYMPTOMS/MOOD NEUROLOGICAL BOWEL NUTRITION STATUS      Continent Diet (Please see DC summary)  AMBULATORY STATUS COMMUNICATION OF NEEDS Skin   Limited Assist Verbally Surgical wounds (Closed incision on leg)                       Personal Care Assistance Level of Assistance  Bathing, Feeding, Dressing Bathing Assistance: Limited assistance Feeding assistance: Independent Dressing Assistance: Limited assistance     Functional Limitations Info             SPECIAL CARE FACTORS FREQUENCY  PT (By licensed PT)     PT Frequency: 5x/week              Contractures      Additional Factors Info  Code Status, Allergies, Isolation Precautions, Insulin Sliding Scale Code Status Info: Full Allergies Info: Not on file   Insulin Sliding Scale Info: insulin aspart (novoLOG) injection 0-15 Units;insulin aspart (novoLOG) injection 0-5 Units;insulin glargine (LANTUS) injection 17 Units Isolation Precautions Info: MRSA     Current Medications (01/08/2016):  This is the current hospital active medication list Current Facility-Administered Medications  Medication Dose Route Frequency Provider Last Rate Last Dose  . acetaminophen (TYLENOL) tablet 650 mg  650 mg Oral Q6H PRN Alberteen Sam, MD   650 mg at 12/28/15 2207   Or  . acetaminophen (TYLENOL) suppository 650  mg  650 mg Rectal Q6H PRN Alberteen Sam, MD      . amitriptyline (ELAVIL) tablet 25 mg  25 mg Oral QHS Alberteen Sam, MD   25 mg at 01/07/16 2237  . amLODipine (NORVASC) tablet 10 mg  10 mg Oral Daily Meredeth Ide, MD   10 mg at 01/08/16 1610  . atorvastatin (LIPITOR) tablet 40 mg  40 mg Oral Daily Alberteen Sam, MD   40 mg at 01/08/16 0921  . carvedilol (COREG) tablet 6.25 mg  6.25 mg Oral BID WC Narda Bonds, MD   6.25 mg at 01/08/16 9604  . cefTRIAXone (ROCEPHIN) 2 g in dextrose 5 % 50 mL IVPB  2 g Intravenous Q24H  Kimberly B Hammons, RPH   2 g at 01/08/16 1542  . docusate sodium (COLACE) capsule 100 mg  100 mg Oral Daily Ames Coupe Rhyne, PA-C   100 mg at 01/08/16 5409  . feeding supplement (GLUCERNA SHAKE) (GLUCERNA SHAKE) liquid 237 mL  237 mL Oral TID BM Drema Dallas, MD   237 mL at 01/08/16 1551  . ferrous sulfate tablet 325 mg  325 mg Oral BID WC Alberteen Sam, MD   325 mg at 01/08/16 0921  . heparin injection 5,000 Units  5,000 Units Subcutaneous Q8H Samantha J Rhyne, PA-C   5,000 Units at 01/08/16 1541  . hydrALAZINE (APRESOLINE) tablet 25 mg  25 mg Oral TID Alberteen Sam, MD   25 mg at 01/08/16 1539  . insulin aspart (novoLOG) injection 0-15 Units  0-15 Units Subcutaneous TID WC Alberteen Sam, MD   3 Units at 01/08/16 1314  . insulin aspart (novoLOG) injection 0-5 Units  0-5 Units Subcutaneous QHS Alberteen Sam, MD   5 Units at 12/27/15 0225  . insulin glargine (LANTUS) injection 17 Units  17 Units Subcutaneous QHS Alberteen Sam, MD   17 Units at 01/07/16 2237  . levothyroxine (SYNTHROID, LEVOTHROID) tablet 25 mcg  25 mcg Oral QAC breakfast Alberteen Sam, MD   25 mcg at 01/08/16 [phone]  . morphine 2 MG/ML injection 2-3 mg  2-3 mg Intravenous Q2H PRN Ames Coupe Rhyne, PA-C   2 mg at 01/08/16 1315  . multivitamin with minerals tablet 1 tablet  1 tablet Oral Daily Drema Dallas, MD   1 tablet at 01/08/16 [phone]  . ondansetron (ZOFRAN) tablet 4 mg  4 mg Oral Q6H PRN Alberteen Sam, MD       Or  . ondansetron (ZOFRAN) injection 4 mg  4 mg Intravenous Q6H PRN Alberteen Sam, MD   4 mg at 01/07/16 1253  . oxyCODONE (Oxy IR/ROXICODONE) immediate release tablet 10 mg  10 mg Oral Q6H PRN Penny Pia, MD   10 mg at 01/08/16 0921  . pantoprazole (PROTONIX) EC tablet 40 mg  40 mg Oral Daily Ames Coupe Rhyne, PA-C   40 mg at 01/08/16 2956  . phenol (CHLORASEPTIC) mouth spray 1 spray  1 spray Mouth/Throat PRN Samantha J Rhyne, PA-C      . pregabalin  (LYRICA) capsule 100 mg  100 mg Oral Daily Alberteen Sam, MD   100 mg at 01/08/16 2130   And  . pregabalin (LYRICA) capsule 150 mg  150 mg Oral BID Alberteen Sam, MD   150 mg at 01/08/16 1539  . vancomycin (VANCOCIN) IVPB 750 mg/150 ml premix  750 mg Intravenous Q12H Lynita Lombard Bellefontaine Neighbors, RPH   750 mg at 01/08/16 [phone]  Discharge Medications: Please see discharge summary for a list of discharge medications.  Relevant Imaging Results:  Relevant Lab Results:   Additional Information SSN: [id number]  Ronald Simpson, [place]

## 2016-01-08 NOTE — Progress Notes (Signed)
Pharmacy Antibiotic Note  Ronald Simpson is a 70 y.o. male admitted on 12/26/2015 with sepsis related to a wound infection. Pharmacy was consulted for vancomycin and Zosyn dosing. To change to Vancomycin and Rocephin today.  Day #11 of broad spectrum abx for infectious gastroenteritis / infected left foot wound. Dry gangrene noted on left second and fourth toes. 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 Bosie HelperEarly's note.  Also, WBC increased 18 today. Plan to continue IV antibiotics.  Plan: Continue vancomycin 750 mg IV q12h Will check Vancomycin level with AM labs. Rocephin 2gm IV q24h Monitor clinical picture and renal function F/U abx deescalation / LOT planned  Height: 5\' 10"  (177.8 cm) Weight: 159 lb 11.2 oz (72.4 kg) IBW/kg (Calculated) : 73  Temp (24hrs), Avg:99.8 F (37.7 C), Min:98.1 F (36.7 C), Max:100.6 F (38.1 C)   Recent Labs Lab 01/02/16 0610 01/03/16 0537 01/03/16 1752 01/04/16 0539 01/05/16 0459 01/06/16 0512 01/06/16 1913 01/07/16 0732 01/08/16 1036  WBC 14.3* 13.3*  --  11.1* 10.8* 9.8 11.2* 11.9* 18.4*  CREATININE 1.27* 1.26*  --   --   --   --   --  1.10 1.29*  VANCOTROUGH  --   --  38* 17  --   --   --   --   --     Estimated Creatinine Clearance: 56.1 mL/min (by C-G formula based on SCr of 1.29 mg/dL (H)).    Not on File  Antimicrobials this admission: Rocephin 9/14  >> 9/15, restarted 9/26 Vancomycin 9/13 x 1, 9/16 >> Zosyn 9/13 x 1, 9/16 >> 9/26  Levels/Adjustments: 9/18 VT 20 (drawn 1 hr late) 9/21 VT 38 (drawn while vancomycin was infusing) 9/22 VT 17  Microbiology results: 9/13 BCx: ngF 9/13 UCx: 30,000 candida albicans 9/15 C-diff neg 9/15- GI panel showed enteropathic ecoli 9/16 Wound culture: MRSA & serratia marcescens (R-ancef) 9/21 Leg abscess: neg 9/21 MRSA PCR positive 9/25 R stump wound:  Thank you for allowing pharmacy to be a part of this patient's care.  Toys 'R' UsKimberly Rannie Craney, Pharm.D.,  BCPS Clinical Pharmacist Pager [phone] 01/08/2016 2:24 PM

## 2016-01-08 NOTE — Progress Notes (Signed)
[facility]  PROGRESS NOTE  Ronald Simpson RUE:[id number] DOB: 05/16/1945 DOA: 12/26/2015 PCP: [name], MD   Brief HPI:  70 y.o. with a history of CAD status post CABG in 2016, PVD s/p R BLA, history of CVA, diabetes mellitus type 2, hepatitis C, Pafib on warfarin. He presented with symptoms of malaise for 4 days. He was found to have sepsis, from the left foot.  Started on broad-spectrum antibiotics, ABI was nondiagnostic, GI pathogen panel was collected on admission due to initial diarrhea which came back positive for EPEC, diarrhea improved and resolved now. Left foot showed gangrenous changes in the second and fourth toe and subsequently she CT scan showed subcutaneous edema in left foot. 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 Vascular surgery was consulted were, it was felt that he may need revision of his R BKA due to wound with purulence near stump and arteriogram to evaluate his left leg -gangrenous toes. Pt has gone back and forth with his discussions with vascular surgery and finally agreed to undergo R AKA, this was completed 9/25,  awaiting VVS plans regarding Arteriogram of LLE   Subjective   Feels ok, eating breakfast, upset abt scrotal swelling   Assessment/Plan:   1. Sepsis- from wound with purulence at R BKA stump and gangrenous changes in 2 toes of L foot       -CT with subcutaneous edema around ankle tracking in the dorsum of the foot and along the ball of the foot.       -ABI inconclusive due to non compressible vessels       -previous BKA in [facility] in May       -Wound Cx from L foot wound: polymicrobial including MRSA and Seratia marcescens       -on IV Vancomycin and Zosyn-Day 13, change to Vancomycin and Ceftraixone today, d/w Dr.Early today he recommends few more days of Abx for his R leg       -Arterial Duplex from [facility] on 9/7 in [facility]: notes  hemodynamically significant stenosis in L superficial fem artery/anterior tibial and proximal R SFA      -appreciate VVS consult, s/p R AKA on 9/25 and will need arteriogram planned for LLE per VVS-d/w Dr.Early today, this will be done later this week      -warfarin on hold for arteriogram now  1. EPEC  Diarrhea     -diarrhea resolved,  GI pathogen panel showed enteropathogenic Escherichia coli.      -supportive care  3.   History of CAD- stable, continue Coreg  2. Hypertension- -stable  3. Hypothyroidism-continue Synthroid  4. History of hepatitis C - Cirrhotic appearance of liver on CT and albumin is 1.6  5. Diabetes mellitus- - continue Lantus and sliding scale insulin with NovoLog. Blood glucose moderately controlled.  7.   Paroxysmal Afib - On anticoagulation with warfarin --pt and family unaware why he was on warfarin, I was able to contact his PCP (Dr.Schneider) who was able to review his Cardiologists note from EMR from 2015 which notes P.Afib as reason for anticoagulation -warfarin on held for BKA and now for arteriogram, resume warfarin post arteriogram  DVT prophylaxis: On Heparin for DVT proph, warfarin on hold  Code Status: Full code Family Communication: No family at bedside, called and d/w mother 9/25 Disposition Plan: SNF pending Surgical plans   Consultants:  VVS   Procedures:  R AKA  on 9/25 Dr.Early  Antibiotics:   Anti-infectives    Start     Dose/Rate Route Frequency Ordered Stop   01/03/16 0600  cefUROXime (ZINACEF) 1.5 g in dextrose 5 % 50 mL IVPB     1.5 g 100 mL/hr over 30 Minutes Intravenous On call to O.R. 01/02/16 1555 01/04/16 0559   01/01/16 0600  vancomycin (VANCOCIN) IVPB 750 mg/150 ml premix     750 mg 150 mL/hr over 60 Minutes Intravenous Every 12 hours 12/31/15 2047     12/30/15 0600  vancomycin (VANCOCIN) IVPB 1000 mg/200 mL premix  Status:  Discontinued     1,000 mg 200 mL/hr over 60 Minutes Intravenous Every 12 hours 12/29/15  1505 12/31/15 2047   12/29/15 1800  vancomycin (VANCOCIN) IVPB 1000 mg/200 mL premix     1,000 mg 200 mL/hr over 60 Minutes Intravenous  Once 12/29/15 1505 12/29/15 1834   12/29/15 1600  piperacillin-tazobactam (ZOSYN) IVPB 3.375 g     3.375 g 12.5 mL/hr over 240 Minutes Intravenous Every 8 hours 12/29/15 1505     12/27/15 2200  vancomycin (VANCOCIN) IVPB 1000 mg/200 mL premix  Status:  Discontinued     1,000 mg 200 mL/hr over 60 Minutes Intravenous Every 24 hours 12/27/15 0146 12/27/15 0339   12/27/15 1000  vancomycin (VANCOCIN) 50 mg/mL oral solution 125 mg  Status:  Discontinued     125 mg Oral 4 times daily 12/27/15 0053 12/27/15 0327   12/27/15 0600  piperacillin-tazobactam (ZOSYN) IVPB 3.375 g  Status:  Discontinued     3.375 g 12.5 mL/hr over 240 Minutes Intravenous Every 8 hours 12/27/15 0146 12/27/15 0339   12/27/15 0600  cefTRIAXone (ROCEPHIN) 2 g in dextrose 5 % 50 mL IVPB  Status:  Discontinued     2 g 100 mL/hr over 30 Minutes Intravenous Every 24 hours 12/27/15 0339 12/28/15 1830   12/26/15 2330  piperacillin-tazobactam (ZOSYN) IVPB 3.375 g     3.375 g 100 mL/hr over 30 Minutes Intravenous  Once 12/26/15 2318 12/27/15 0030   12/26/15 2100  vancomycin (VANCOCIN) IVPB 1000 mg/200 mL premix     1,000 mg 200 mL/hr over 60 Minutes Intravenous  Once 12/26/15 2046 12/26/15 2348       Objective   Vitals:   01/07/16 1516 01/07/16 2235 01/08/16 0223 01/08/16 0640  BP: (!) 160/82 (!) 159/88 (!) 167/86 (!) 163/82  Pulse: 88 (!) 103 (!) 108 (!) 110  Resp: 17 18 20 16   Temp: 98.1 F (36.7 C) 100 F (37.8 C) (!) 100.6 F (38.1 C) 100.3 F (37.9 C)  TempSrc: Oral Oral Oral   SpO2: 98% 99% 100% 96%  Weight:      Height:        Intake/Output Summary (Last 24 hours) at 01/08/16 1319 Last data filed at 01/08/16 0654  Gross per 24 hour  Intake           1472.5 ml  Output              650 ml  Net            822.5 ml   Filed Weights   12/27/15 0136  Weight: 72.4 kg (159  lb 11.2 oz)     Physical Examination:  General exam: Appears calm and comfortable, AAOx2, no distress, chronically ill Respiratory system: Clear to auscultation. Respiratory effort normal. Cardiovascular system:  RRR. No  murmurs, rubs, gallops. No pedal edema. GI system: Abdomen is nondistended, soft and nontender. No organomegaly.  Central nervous system. No focal neurological deficits. Musculoskeletal- R AKA with dressing Skin: Dry gangrene noted on the medial aspect of the second and fourth toe, minimal pus noted was from black eschar on deep palpation. Tender to palpation R AKA with dressing Psychiatry: normal affect    Data Reviewed: I have personally reviewed following labs and imaging studies  CBG:  Recent Labs Lab 01/07/16 1351 01/07/16 1728 01/07/16 2153 01/08/16 0811 01/08/16 1146  GLUCAP 149* 160* 147* 158* 154*    CBC:  Recent Labs Lab 01/05/16 0459 01/06/16 0512 01/06/16 1913 01/07/16 0732 01/08/16 1036  WBC 10.8* 9.8 11.2* 11.9* 18.4*  HGB 8.2* 7.7* 9.8* 10.2* 11.1*  HCT 25.3* 24.5* 29.8* 31.4* 34.7*  MCV 89.7 91.1 89.5 90.0 91.3  PLT 284 288 276 294 334    Basic Metabolic Panel:  Recent Labs Lab 01/02/16 0610 01/03/16 0537 01/07/16 0732 01/08/16 1036  NA 127* 130* 132* 131*  K 4.2 4.1 4.4 4.5  CL 103 104 107 105  CO2 19* 20* 21* 19*  GLUCOSE 156* 129* 132* 151*  BUN 16 14 12 12   CREATININE 1.27* 1.26* 1.10 1.29*  CALCIUM 8.1* 8.2* 8.4* 8.5*    Recent Results (from the past 240 hour(s))  Surgical pcr screen     Status: Abnormal   Collection Time: 01/03/16  1:50 AM  Result Value Ref Range Status   MRSA, PCR POSITIVE (A) NEGATIVE Final    Comment: RESULT CALLED TO, READ BACK BY AND VERIFIED WITH: N WYLIE,RN @0645  01/03/16 MKELLY,MLT    Staphylococcus aureus POSITIVE (A) NEGATIVE Final    Comment:        The Xpert SA Assay (FDA approved for NASAL specimens in patients over 33 years of age), is one component of a comprehensive  surveillance program.  Test performance has been validated by [facility] for patients greater than or equal to 63 year old. It is not intended to diagnose infection nor to guide or monitor treatment.   Aerobic Culture (superficial specimen)     Status: None   Collection Time: 01/03/16 12:56 PM  Result Value Ref Range Status   Specimen Description ABSCESS LEG RIGHT  Final   Special Requests DISCHARGE RBKA  Final   Gram Stain   Final    RARE WBC PRESENT, PREDOMINANTLY PMN NO ORGANISMS SEEN    Culture NO GROWTH 2 DAYS  Final   Report Status 01/06/2016 FINAL  Final  Aerobic/Anaerobic Culture (surgical/deep wound)     Status: None (Preliminary result)   Collection Time: 01/07/16 12:17 PM  Result Value Ref Range Status   Specimen Description WOUND FLUID ON SWAB  Final   Special Requests RIGHT BELOW KNEE AMPUTATION  Final   Gram Stain   Final    MODERATE WBC PRESENT, PREDOMINANTLY PMN NO ORGANISMS SEEN    Culture NO GROWTH < 24 HOURS  Final   Report Status PENDING  Incomplete     Liver Function Tests:  Recent Labs Lab 01/07/16 0732  AST 28  ALT 19  ALKPHOS 165*  BILITOT 0.8  PROT 6.9  ALBUMIN 1.5*   No results for input(s): LIPASE, AMYLASE in the last 168 hours. No results for input(s): AMMONIA in the last 168 hours.  Cardiac Enzymes: No results for input(s): CKTOTAL, CKMB, CKMBINDEX, TROPONINI in the last 168 hours. BNP (last 3 results) No results for input(s): BNP in the last 8760 hours.  ProBNP (last 3 results) No results for input(s): PROBNP in the last 8760 hours.  Studies: No results found.  Scheduled Meds: . amitriptyline  25 mg Oral QHS  . amLODipine  10 mg Oral Daily  . atorvastatin  40 mg Oral Daily  . carvedilol  6.25 mg Oral BID WC  . docusate sodium  100 mg Oral Daily  . feeding supplement (GLUCERNA SHAKE)  237 mL Oral TID BM  . ferrous sulfate  325 mg Oral BID WC  . heparin subcutaneous  5,000 Units Subcutaneous Q8H  . hydrALAZINE  25 mg  Oral TID  . insulin aspart  0-15 Units Subcutaneous TID WC  . insulin aspart  0-5 Units Subcutaneous QHS  . insulin glargine  17 Units Subcutaneous QHS  . levothyroxine  25 mcg Oral QAC breakfast  . multivitamin with minerals  1 tablet Oral Daily  . pantoprazole  40 mg Oral Daily  . piperacillin-tazobactam (ZOSYN)  IV  3.375 g Intravenous Q8H  . pregabalin  100 mg Oral Daily   And  . pregabalin  150 mg Oral BID  . vancomycin  750 mg Intravenous Q12H  . Warfarin - Pharmacist Dosing Inpatient   Does not apply q1800    Continuous Infusions: . sodium chloride 50 mL/hr at 01/07/16 1627    Time spent: 25 min  Sylvan Sookdeo   [facility] Pager [phone] If 7PM-7AM, please contact night-coverage at [email or web address], Office  [phone]  password [email or web address] 01/08/2016, 1:19 PM  LOS: 13 days

## 2016-01-08 NOTE — Progress Notes (Signed)
Nutrition Follow-up  DOCUMENTATION CODES:   Not applicable  INTERVENTION:   -Continue Glucerna Shake po TID, each supplement provides 220 kcal and 10 grams of protein  NUTRITION DIAGNOSIS:   Increased nutrient needs related to wound healing as evidenced by estimated needs.  Ongoing  GOAL:   Patient will meet greater than or equal to 90% of their needs  Progressing  MONITOR:   PO intake, Supplement acceptance, Labs, Weight trends, Skin, I & O's  REASON FOR ASSESSMENT:   Malnutrition Screening Tool    ASSESSMENT:   70 y.o. with a history of CAD status post CABG in 2016, history of CVA, diabetes mellitus type 2, hepatitis C, chronic anticoagulation for unknown reasons. He presented with symptoms of malaise for 4 days. He was found to have sepsis, possibly from the left foot vs diarrhea.   S/p Procedure(s) (LRB) on 01/07/16: AMPUTATION ABOVE KNEE (Right)  Per vascular surgery notes, lt foot wounds (gangrenous toes) are stable and plan to for arteriogram on 01/10/16.   Pt sleeping soundly at time of visit. RD did not wake.   Spoke with RN, who reports that pt requires set up for meals. Pt did not eat a lot of breakfast due to sleepiness. Noted 25-100% meal completion. RN confirmed pt is consuming Glucerna supplements. RD will continue order.   Reviewed therapy notes, noted potential admission to CIR.   Labs reviewed: Na: 131, CBGS: 147-160.   Diet Order:  Diet Carb Modified Fluid consistency: Thin; Room service appropriate? Yes  Skin:  Wound (see comment) (rt AKA, DM ulcers on lt foot)  Last BM:  01/07/16  Height:   Ht Readings from Last 1 Encounters:  12/27/15 5\' 10"  (1.778 m)    Weight:   Wt Readings from Last 1 Encounters:  12/27/15 159 lb 11.2 oz (72.4 kg)    Ideal Body Weight:  69.4 kg  BMI:  Body mass index is 22.91 kg/m.  Estimated Nutritional Needs:   Kcal:  1700-1900  Protein:  90-105 grams  Fluid:  1.7-1.9 L  EDUCATION NEEDS:    Education needs addressed  Lorilynn Lehr A. Mayford KnifeWilliams, RD, LDN, CDE Pager: [phone] After hours Pager: [phone]

## 2016-01-08 NOTE — Consult Note (Signed)
Physical Medicine and Rehabilitation Consult Reason for Consult: Right AKA Referring Physician: [facility]   HPI: Ronald Simpson is a 70 y.o. right handed male with history of hepatitis C, CAD status post CABG 2015 maintained on Coumadin, CVA with little residual deficits, diabetes mellitus, tobacco abuse, PVD with recent right [facility]. Per chart review patient lives with 68 year old mother. Used a wheelchair walker prior to admission. 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 One level home. Presented 12/27/2015 with low-grade fever, general malaise, WBC 24,500, BUN 50, creatinine 1.66, dry gangrenous changes to the second and fourth left toe and ischemic changes to right BKA site. Family reported recent fall on right stump. Blood cultures no growth. X-ray right lower extremity showed soft tissue edema of the distal stomach. No tracking soft tissue air. No radiographic findings of osteomyelitis. Left foot with no bony destruction to confirm osteomyelitis. No soft tissue gas.  GI panel positive for enteropathic Escherichia coli. Placed on broad-spectrum antibiotics. 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 Arbie CookeyEarly. Hospital course pain management. Subcutaneous heparin for DVT prophylaxis and plan to resume chronic Coumadin.Marland Kitchen. Physical and occupational therapy evaluations pending. M.D. has requested physical medicine rehabilitation consult   Review of Systems  Constitutional: Positive for fever. Negative for chills.  HENT: Negative for hearing loss.   Eyes: Negative for blurred vision and double vision.  Respiratory: Negative for cough and shortness of breath.   Cardiovascular: Positive for leg swelling. Negative for chest pain.  Gastrointestinal: Positive for constipation. Negative for nausea and vomiting.  Genitourinary: Positive for urgency. Negative for dysuria  and hematuria.  Musculoskeletal: Positive for joint pain and myalgias.  Skin: Negative for rash.  Neurological: Positive for weakness. Negative for seizures, loss of consciousness and headaches.  Psychiatric/Behavioral: The patient has insomnia.   All other systems reviewed and are negative.  Past Medical History:  Diagnosis Date  . CAD (coronary artery disease)   . CVA (cerebral infarction)   . Diabetes mellitus without complication (HCC)   . History of hepatitis B   . History of hepatitis C   . Peripheral vascular disease [facility](HCC)    Past Surgical History:  Procedure Laterality Date  . BELOW KNEE LEG AMPUTATION Right   . CORONARY ARTERY BYPASS GRAFT  2005  . STERNOTOMY     Family History  Problem Relation Age of Onset  . Hypertension Other   . Diabetes Other   . Mental illness Other   . Lupus Other    Social History:  reports that he has been smoking.  He has been smoking about 0.50 packs per day. He has never used smokeless tobacco. He reports that he uses drugs, including Cocaine and Marijuana. His alcohol history is not on file. Allergies: Not on File Medications Prior to Admission  Medication Sig Dispense Refill  . amitriptyline (ELAVIL) 25 MG tablet Take 25 mg by mouth at bedtime.    Marland Kitchen. amLODipine (NORVASC) 10 MG tablet Take 10 mg by mouth daily.    Marland Kitchen. atorvastatin (LIPITOR) 40 MG tablet Take 40 mg by mouth daily.    . carvedilol (COREG) 6.25 MG tablet Take 6.25 mg by mouth 2 (two) times daily with a meal.    . ferrous sulfate 325 (65 FE) MG tablet Take 325 mg by mouth 2 (two) times daily with a meal.    .  furosemide (LASIX) 20 MG tablet Take 20 mg by mouth daily.    . hydrALAZINE (APRESOLINE) 25 MG tablet Take 25 mg by mouth 3 (three) times daily.    . insulin glargine (LANTUS) 100 unit/mL SOPN Inject 17 Units into the skin at bedtime.    Marland Kitchen levothyroxine (SYNTHROID, LEVOTHROID) 25 MCG tablet Take 25 mcg by mouth daily before breakfast.    . lisinopril (PRINIVIL,ZESTRIL)  10 MG tablet Take 10 mg by mouth daily.    . nitroGLYCERIN (NITROSTAT) 0.4 MG SL tablet Place 0.4 mg under the tongue every 5 (five) minutes as needed for chest pain.    Marland Kitchen oxyCODONE (OXY IR/ROXICODONE) 5 MG immediate release tablet Take 5 mg by mouth every 4 (four) hours as needed for severe pain.    . pregabalin (LYRICA) 50 MG capsule Take 100-150 mg by mouth 3 (three) times daily. Take 100mg  every morning and 150mg  at 1400 and bedtime    . spironolactone (ALDACTONE) 25 MG tablet Take 25 mg by mouth daily.    Marland Kitchen warfarin (COUMADIN) 3 MG tablet Take 7 mg by mouth daily. Takes along with a 4mg  tablet      Home: Home Living Living Arrangements: Alone  Functional History:   Functional Status:  Mobility:          ADL:    Cognition: Cognition Orientation Level: Oriented X4    Blood pressure (!) 167/86, pulse (!) 108, temperature (!) 100.6 F (38.1 C), temperature source Oral, resp. rate 20, height 5\' 10"  (1.778 m), weight 72.4 kg (159 lb 11.2 oz), SpO2 100 %. Physical Exam  Vitals reviewed. Constitutional: He is oriented to person, place, and time. He appears well-developed and well-nourished.  HENT:  Head: Normocephalic and atraumatic.  Eyes: Conjunctivae and EOM are normal.  Neck: Normal range of motion. Neck supple. No thyromegaly present.  Cardiovascular: Normal rate and regular rhythm.   Respiratory: Effort normal and breath sounds normal. No respiratory distress.  GI: Soft. Bowel sounds are normal. He exhibits no distension.  Musculoskeletal: He exhibits edema and tenderness.  Right AKA +1 edema left foot as well  Neurological: He is alert and oriented to person, place, and time.  Sensation intact to light touch Motor: B/l UE, LLE: 5/5 proximal to distal RLE: pain inhibition  Skin: Skin is warm and dry.  Right AKA is dressed appropriately tender. Ischemic changes left foot  Psychiatric:  Irritable    Results for orders placed or performed during the hospital  encounter of 12/26/15 (from the past 24 hour(s))  Protime-INR     Status: Abnormal   Collection Time: 01/07/16  7:32 AM  Result Value Ref Range   Prothrombin Time 15.7 (H) 11.4 - 15.2 seconds   INR 1.25   CBC     Status: Abnormal   Collection Time: 01/07/16  7:32 AM  Result Value Ref Range   WBC 11.9 (H) 4.0 - 10.5 K/uL   RBC 3.49 (L) 4.22 - 5.81 MIL/uL   Hemoglobin 10.2 (L) 13.0 - 17.0 g/dL   HCT 16.1 (L) 09.6 - 04.5 %   MCV 90.0 78.0 - 100.0 fL   MCH 29.2 26.0 - 34.0 pg   MCHC 32.5 30.0 - 36.0 g/dL   RDW 40.9 81.1 - 91.4 %   Platelets 294 150 - 400 K/uL  Comprehensive metabolic panel     Status: Abnormal   Collection Time: 01/07/16  7:32 AM  Result Value Ref Range   Sodium 132 (L) 135 - 145 mmol/L  Potassium 4.4 3.5 - 5.1 mmol/L   Chloride 107 101 - 111 mmol/L   CO2 21 (L) 22 - 32 mmol/L   Glucose, Bld 132 (H) 65 - 99 mg/dL   BUN 12 6 - 20 mg/dL   Creatinine, Ser 4.54 0.61 - 1.24 mg/dL   Calcium 8.4 (L) 8.9 - 10.3 mg/dL   Total Protein 6.9 6.5 - 8.1 g/dL   Albumin 1.5 (L) 3.5 - 5.0 g/dL   AST 28 15 - 41 U/L   ALT 19 17 - 63 U/L   Alkaline Phosphatase 165 (H) 38 - 126 U/L   Total Bilirubin 0.8 0.3 - 1.2 mg/dL   GFR calc non Af Amer >60 >60 mL/min   GFR calc Af Amer >60 >60 mL/min   Anion gap 4 (L) 5 - 15  Glucose, capillary     Status: Abnormal   Collection Time: 01/07/16  9:03 AM  Result Value Ref Range   Glucose-Capillary 123 (H) 65 - 99 mg/dL  Aerobic/Anaerobic Culture (surgical/deep wound)     Status: None (Preliminary result)   Collection Time: 01/07/16 12:17 PM  Result Value Ref Range   Specimen Description WOUND FLUID ON SWAB    Special Requests RIGHT BELOW KNEE AMPUTATION    Gram Stain      MODERATE WBC PRESENT, PREDOMINANTLY PMN NO ORGANISMS SEEN    Culture PENDING    Report Status PENDING   Glucose, capillary     Status: Abnormal   Collection Time: 01/07/16  1:51 PM  Result Value Ref Range   Glucose-Capillary 149 (H) 65 - 99 mg/dL  Glucose,  capillary     Status: Abnormal   Collection Time: 01/07/16  5:28 PM  Result Value Ref Range   Glucose-Capillary 160 (H) 65 - 99 mg/dL  Glucose, capillary     Status: Abnormal   Collection Time: 01/07/16  9:53 PM  Result Value Ref Range   Glucose-Capillary 147 (H) 65 - 99 mg/dL   No results found.  Assessment/Plan: Diagnosis:  Right AKA Labs and images independently reviewed.  Records reviewed and summated above. Clean amputation daily with soap and water Monitor incision site for signs of infection or impending skin breakdown. Staples to remain in place for 3-4 weeks Stump shrinker, for edema control  Scar mobilization massaging to prevent soft tissue adherence Stump protector during therapies Prevent flexion contractures by implementing the following:   Encourage prone lying for 20-30 mins per day BID to avoid hip flexion  Contractures if medically appropriate;  Avoid prolonged sitting Post surgical pain control with oral medication Phantom limb pain control with physical modalities including desensitization techniques (gentle self massage to the residual stump,hot packs if sensation intact, [place]) and mirror therapy, TENS. If ineffective, consider pharmacological treatment for neuropathic pain (e.g gabapentin, pregabalin, amytriptalyine, duloxetine).  Avoid injury to contralateral side  1. Does the need for close, 24 hr/day medical supervision in concert with the patient's rehab needs make it unreasonable for this patient to be served in a less intensive setting? Potentially  2. Co-Morbidities requiring supervision/potential complications: hepatitis C (avoid hepatotoxic meds), CAD status post CABG (cont meds, Monitor in accordance with increased physical activity and avoid UE resistance excercises), CVA with little residual deficits, diabetes mellitus (Monitor in accordance with exercise and adjust meds as necessary), tobacco abuse (counsel), PVD (cont meds), post-op pain management  (Biofeedback training with therapies to help reduce reliance on opiate pain medications, monitor pain control during therapies, and sedation at rest and titrate to maximum  efficacy to ensure participation and gains in therapies), infectious diarrhea (cont abx), wound infection (cont abx), HTN (monitor and provide prns in accordance with increased physical exertion and pain), fevers (Sepsis) (cont to monitor for signs and symptoms of infection, further workup if indicated), Tachycardia (monitor in accordance with pain and increasing activity), hyponatremia (cont to monitor, treat as necessary), hypoalbuminemia (supplement), leukocytosis (cont to monitor for signs and symptoms of infection, further workup if indicated) 3. Due to safety, skin/wound care, disease management, pain management and patient education, does the patient require 24 hr/day rehab nursing? Yes 4. Does the patient require coordinated care of a physician, rehab nurse, PT (1-2 hrs/day, 5 days/week) and OT (1-2 hrs/day, 5 days/week) to address physical and functional deficits in the context of the above medical diagnosis(es)? Potentially Addressing deficits in the following areas: balance, endurance, locomotion, transferring, bathing, dressing, toileting and psychosocial support 5. Can the patient actively participate in an intensive therapy program of at least 3 hrs of therapy per day at least 5 days per week? Potentially 6. The potential for patient to make measurable gains while on inpatient rehab is TBD 7. Anticipated functional outcomes upon discharge from inpatient rehab are TBD  with PT, TBD with OT, n/a with SLP. 8. Estimated rehab length of stay to reach the above functional goals is: TBD 9. Does the patient have adequate social supports and living environment to accommodate these discharge functional goals? Potentially 10. Anticipated D/C setting: Home 11. Anticipated post D/C treatments: HH therapy and Home excercise  program 12. Overall Rehab/Functional Prognosis: good  RECOMMENDATIONS: This patient's condition is appropriate for continued rehabilitative care in the following setting: Likely CIR after medically stable, plan for LLE, pt demonstartes ability and willingness to participate in 3 hours therapy/day.  Will await therapy evals. Patient has agreed to participate in recommended program. Potentially Note that insurance prior authorization may be required for reimbursement for recommended care.  Comment: Rehab Admissions Coordinator to follow up.  Maryla Morrow, MD, [place] 01/08/2016

## 2016-01-08 NOTE — Clinical Social Work Note (Signed)
Clinical Social Work Assessment  Patient Details  Name: Ronald Simpson MRN: [id number] Date of Birth: [birth year]  Date of referral:  01/08/16               Reason for consult:  Facility Placement                Permission sought to share information with:  Facility Art therapist granted to share information::  Yes, Verbal Permission Granted  Name::        Agency::  SNFs  Relationship::     Contact Information:     Housing/Transportation Living arrangements for the past 2 months:  Single Family Home Source of Information:  Patient Patient Interpreter Needed:  None Criminal Activity/Legal Involvement Pertinent to Current Situation/Hospitalization:  No - Comment as needed Significant Relationships:  Parents Lives with:  Self Do you feel safe going back to the place where you live?  No Need for family participation in patient care:  No (Coment)  Care giving concerns:  CSW received consult for possible SNF placement at time of discharge if CIR is unable to admit. CSW met with patient regarding PT recommendation of SNF placement at time of discharge. Patient appeared reluctant to speak with CSW, though agreed that he is currently unable to care for himself given patient's current physical needs and fall risk. Patient expressed understanding of PT recommendation and is agreeable to SNF placement at time of discharge. CSW to continue to follow and assist with discharge planning needs.   Social Worker assessment / plan:  CSW spoke with patient concerning possibility of rehab at [facility] before returning home.  Employment status:  Retired Forensic scientist:  [employer] PT Recommendations:  [place], [place] / Referral to community resources:  [place]  Patient/Family's Response to care:  Patient recognized need for rehab before returning home and is agreeable to a SNF in the event that CIR is unable to  admit.  Patient/Family's Understanding of and Emotional Response to Diagnosis, Current Treatment, and Prognosis:  Patient/family is realistic regarding therapy needs. Patient expressed understanding of CSW role and discharge process. No questions/concerns about plan or treatment.    Emotional Assessment Appearance:  Appears stated age Attitude/Demeanor/Rapport:  Avoidant, Unresponsive, Hostile, Guarded Affect (typically observed):  Flat, Guarded, Irritable Orientation:  Oriented to Self, Oriented to Situation, Oriented to Place, Oriented to  Time Alcohol / Substance use:  Not Applicable Psych involvement (Current and /or in the community):  No (Comment)  Discharge Needs  Concerns to be addressed:  Care Coordination Readmission within the last 30 days:  No Current discharge risk:  Dependent with Mobility Barriers to Discharge:  Continued Medical Work up   [employer], [place] 01/08/2016, 5:06 PM

## 2016-01-08 NOTE — Evaluation (Signed)
Occupational Therapy Evaluation Patient Details Name: Ronald Simpson MRN: [id number] DOB: 05/23/1945 Today's Date: 01/08/2016    History of Present Illness 70 y.o. with a history of CAD status post CABG in 2016, history of CVA, diabetes mellitus type 2, hepatitis C, chronic anticoagulation for unknown reasons. He presented with symptoms of malaise for 4 days. Admission, he met sepsis criteria with possible source of urine. Pt had R BKA in 5/17 and now with left foot wound infection.  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   Clinical Impression   PTA, pt required some assistance for basic ADLs from his mother and was mobilizing very little at home per his report. Pt currently requires mod +2 assist for safety for bed mobility and to complete grooming tasks seated EOB. Pt declined to mobilize further than sitting EOB today due to pain and fatigue, but expressed his goal of being able to transfer to the chair during the next therapy session. Feel pt is a good candidate for CIR as he has goals that he would liek to achieve to reach more independent level of function and he has 24/7 supervision from his mother at home. Will continue to follow acutely.   Follow Up Recommendations  CIR;Supervision/Assistance - 24 hour    Equipment Recommendations  Other (comment) (TBD at next venue of care)    Recommendations for Other Services       Precautions / Restrictions Precautions Precautions: Fall      Mobility Bed Mobility Overal bed mobility: Needs Assistance Bed Mobility: Supine to Sit;Sit to Supine     Supine to sit: Mod assist;+2 for safety/equipment Sit to supine: Min assist;+2 for safety/equipment   General bed mobility comments: Pt required increased time to roll and sit EOB with mod assist provided to pivot and scoot hips to EOB and for trunk support to come to sitting position. On return to bed, pt able to come down onto elbow and scoot back into bed with min assist.    Transfers                 General transfer comment: Pt declined to mobilize further than sitting EOB this session. Pt agreeable to transfer to chair next session.    Balance Overall balance assessment: Needs assistance Sitting-balance support: Bilateral upper extremity supported;Feet supported Sitting balance-Leahy Scale: Poor Sitting balance - Comments: sat EOB about 10 minutes to wash face, brush teeth and change gown with encouragement,  pt sat with S and L UE assist throughout leaning L due to R LE pain and sitting on incline in bed Postural control: Left lateral lean                                  ADL Overall ADL's : Needs assistance/impaired Eating/Feeding: Set up;Bed level   Grooming: Wash/dry hands;Wash/dry face;Set up;Sitting   Upper Body Bathing: Minimal assitance;Sitting   Lower Body Bathing: Maximal assistance;Sitting/lateral leans   Upper Body Dressing : Minimal assistance;Sitting                     General ADL Comments: Pt declined to mobilize further than sitting EOB this session.      Vision Vision Assessment?: No apparent visual deficits   Perception     Praxis      Pertinent Vitals/Pain Pain Assessment: Faces Faces Pain Scale: Hurts even more Pain Location: RLE Pain Descriptors / Indicators: Discomfort;Grimacing;Guarding Pain  Intervention(s): Limited activity within patient's tolerance;Monitored during session;Repositioned;Premedicated before session     Hand Dominance Right   Extremity/Trunk Assessment Upper Extremity Assessment Upper Extremity Assessment: RUE deficits/detail RUE Deficits / Details: Grossly WFL, but strength elbow extension 3+/5   Lower Extremity Assessment Lower Extremity Assessment: LLE deficits/detail RLE Deficits / Details: AKA on pillow, able to lift antigravity, but with pain and increased time LLE Deficits / Details: edema L foot and noted eschar on plantar surface of middle toes, AAROM  limited hip flexion, strength at least 3/5   Cervical / Trunk Assessment Cervical / Trunk Assessment: Kyphotic   Communication Communication Communication: No difficulties   Cognition Arousal/Alertness: Awake/alert Behavior During Therapy: WFL for tasks assessed/performed Overall Cognitive Status: Impaired/Different from baseline Area of Impairment: Orientation;Safety/judgement;Following commands Orientation Level: Disoriented to;Time;Place     Following Commands: Follows multi-step commands with increased time Safety/Judgement: Decreased awareness of deficits         General Comments       Exercises       Shoulder Instructions      Home Living Family/patient expects to be discharged to:: Inpatient rehab Living Arrangements: Parent Available Help at Discharge: Family;Available PRN/intermittently Type of Home: House Home Access: Stairs to enter [employer] of Steps: 2 Entrance Stairs-Rails: None Home Layout: One level     Bathroom Shower/Tub: Teacher, early years/pre: Standard     Home Equipment: Wheelchair - [employer] - 2 wheels          Prior Functioning/Environment Level of Independence: Needs assistance  Gait / Transfers Assistance Needed: states mainly laid around at home ADL's / Homemaking Assistance Needed: mother assist with meals, pt was sponge bathing            OT Problem List: Decreased strength;Decreased activity tolerance;Impaired balance (sitting and/or standing);Decreased safety awareness;Decreased knowledge of use of DME or AE;Decreased cognition;Pain   OT Treatment/Interventions: Self-care/ADL training;Therapeutic exercise;DME and/or AE instruction;Therapeutic activities;Patient/family education;Balance training    OT Goals(Current goals can be found in the care plan section) Acute Rehab OT Goals Patient Stated Goal: To go home OT Goal Formulation: With patient Time For Goal Achievement: 01/22/16 Potential to  Achieve Goals: Good ADL Goals Pt Will Perform Grooming: with set-up;sitting Pt Will Perform Upper Body Bathing: with set-up;sitting Pt Will Perform Lower Body Bathing: with min assist;sit to/from stand Pt Will Transfer to Toilet: with min assist;stand pivot transfer;bedside commode Pt Will Perform Toileting - Clothing Manipulation and hygiene: with min assist;sit to/from stand Pt/caregiver will Perform Home Exercise Program: Increased strength;Both right and left upper extremity;With theraband;With written HEP provided;Independently  OT Frequency: Min 2X/week   Barriers to D/C: Decreased caregiver support  Unsure if pt's mother will be able to provide any physical assistance       Co-evaluation PT/OT/SLP Co-Evaluation/Treatment: Yes Reason for Co-Treatment: For patient/therapist safety   OT goals addressed during session: ADL's and self-care;Proper use of Adaptive equipment and DME      End of Session Nurse Communication: Mobility status  Activity Tolerance: Patient limited by fatigue;Patient limited by pain Patient left: in bed;with call bell/phone within reach;with bed alarm set   Time: 0350-0938 OT Time Calculation (min): 21 min Charges:  OT General Charges $OT Visit: 1 Procedure OT Evaluation $OT Eval Moderate Complexity: 1 Procedure G-Codes:    Redmond Baseman, OTR/L Pager: [phone] 01/08/2016, 3:40 PM

## 2016-01-08 NOTE — Progress Notes (Addendum)
  Progress Note    01/08/2016 7:58 AM 1 Day Post-Op  Subjective:  C/o pain  Tm 100.6 now 100.3 HR  100's-110's  150's-160's systolic 96% RA  Vitals:   01/08/16 0223 01/08/16 0640  BP: (!) 167/86 (!) 163/82  Pulse: (!) 108 (!) 110  Resp: 20 16  Temp: (!) 100.6 F (38.1 C) 100.3 F (37.9 C)    Physical Exam: Incisions:  Dressing is clean and dry   CBC    Component Value Date/Time   WBC 11.9 (H) 01/07/2016 0732   RBC 3.49 (L) 01/07/2016 0732   HGB 10.2 (L) 01/07/2016 0732   HCT 31.4 (L) 01/07/2016 0732   PLT 294 01/07/2016 0732   MCV 90.0 01/07/2016 0732   MCH 29.2 01/07/2016 0732   MCHC 32.5 01/07/2016 0732   RDW 13.8 01/07/2016 0732   LYMPHSABS 1.3 12/26/2015 1908   MONOABS 1.6 (H) 12/26/2015 1908   EOSABS 0.0 12/26/2015 1908   BASOSABS 0.0 12/26/2015 1908    BMET    Component Value Date/Time   NA 132 (L) 01/07/2016 0732   K 4.4 01/07/2016 0732   CL 107 01/07/2016 0732   CO2 21 (L) 01/07/2016 0732   GLUCOSE 132 (H) 01/07/2016 0732   BUN 12 01/07/2016 0732   CREATININE 1.10 01/07/2016 0732   CALCIUM 8.4 (L) 01/07/2016 0732   GFRNONAA >60 01/07/2016 0732   GFRAA >60 01/07/2016 0732    INR    Component Value Date/Time   INR 1.25 01/07/2016 0732     Intake/Output Summary (Last 24 hours) at 01/08/16 0758 Last data filed at 01/08/16 0654  Gross per 24 hour  Intake           2472.5 ml  Output             1000 ml  Net           1472.5 ml     Assessment/Plan:  70 y.o. male is s/p right above knee amputation  1 Day Post-Op  -pt's dressing is dry  -c/o pain-receiving morphine - will benefit from taking oral OxyIR, which is ordered -will take down dressing tomorrow -continue abx given purulence in joint space -will need arteriogram to evaluate LLE later this week.   Doreatha MassedSamantha Rhyne, PA-C [facility] [phone] 01/08/2016 7:58 AM   I have examined the patient, reviewed and agree with above.Plan left lower ext angio  Thursday  Gretta BeganEarly, Mohsin Crum, MD 01/08/2016 8:10 AM

## 2016-01-08 NOTE — Evaluation (Signed)
Physical Therapy Evaluation Patient Details Name: Colleen Donahoe MRN: [id number] DOB: 06-24-1945 Today's Date: 01/08/2016   History of Present Illness  70 y.o. with a history of CAD status post CABG in 2016, history of CVA, diabetes mellitus type 2, hepatitis C, chronic anticoagulation for unknown reasons. He presented with symptoms of malaise for 4 days. Admission, he met sepsis criteria with possible source of urine. Pt had R BKA in 5/17 and now with left foot wound infection.  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  Clinical Impression  Patient presents with decreased mobility due to deficits listed in PT problem list.  He will benefit from skilled PT in the acute setting to allow return home with family support following CIR level rehab stay.     Follow Up Recommendations CIR    Equipment Recommendations  Other (comment) (will need ramp for home entry)    Recommendations for Other Services Rehab consult     Precautions / Restrictions Precautions Precautions: Fall      Mobility  Bed Mobility Overal bed mobility: Needs Assistance Bed Mobility: Supine to Sit;Sit to Supine     Supine to sit: Mod assist;+2 for safety/equipment Sit to supine: Min assist;+2 for safety/equipment   General bed mobility comments: increased time to roll and come up to sit with assist for scooting hips and lifting trunk; to supine using railing and scooting up and over in bed wiht bed pad  Transfers                 General transfer comment: agreeable to OOB next session  Ambulation/Gait                Stairs            Wheelchair Mobility    Modified Rankin (Stroke Patients Only)       Balance Overall balance assessment: Needs assistance Sitting-balance support: Bilateral upper extremity supported;Single extremity supported;Feet unsupported Sitting balance-Leahy Scale: Poor Sitting balance - Comments: sat EOB about 10 minutes to wash face, brush teeth and change  gown with encouragement,  pt sat with S and L UE assist throughout leaning L due to R LE pain and sitting on incline in bed Postural control: Left lateral lean                                   Pertinent Vitals/Pain Pain Assessment: Faces Faces Pain Scale: Hurts even more Pain Location: R LE Pain Descriptors / Indicators: Discomfort;Grimacing;Guarding Pain Intervention(s): Limited activity within patient's tolerance;Monitored during session;Repositioned;Premedicated before session    Home Living Family/patient expects to be discharged to:: Inpatient rehab Living Arrangements: Parent Available Help at Discharge: Family;Available PRN/intermittently Type of Home: House Home Access: Stairs to enter Entrance Stairs-Rails: None Entrance Stairs-Number of Steps: 2 Home Layout: One level Home Equipment: Wheelchair - [employer] - 2 wheels      Prior Function Level of Independence: Needs assistance   Gait / Transfers Assistance Needed: states mainly laid around at home  ADL's / Homemaking Assistance Needed: mother assist with meals, pt was sponge bathing        Hand Dominance   Dominant Hand: Right    Extremity/Trunk Assessment   Upper Extremity Assessment: RUE deficits/detail RUE Deficits / Details: Grossly WFL, but strength elbow extension 3+/5         Lower Extremity Assessment: RLE deficits/detail;LLE deficits/detail RLE Deficits / Details: AKA on pillow, able to  lift antigravity, but with pain and increased time LLE Deficits / Details: edema L foot and noted eschar on plantar surface of middle toes, AAROM limited hip flexion, strength at least 3/5     Communication   Communication: No difficulties  Cognition Arousal/Alertness: Awake/alert Behavior During Therapy: WFL for tasks assessed/performed Overall Cognitive Status: Impaired/Different from baseline Area of Impairment: Orientation;Safety/judgement;Following commands Orientation Level:  Disoriented to;Time;Place     Following Commands: Follows multi-step commands with increased time Safety/Judgement: Decreased awareness of deficits          General Comments      Exercises     Assessment/Plan    PT Assessment Patient needs continued PT services  PT Problem List Decreased strength;Decreased activity tolerance;Decreased balance;Decreased knowledge of use of DME;Decreased mobility;Decreased safety awareness;Pain          PT Treatment Interventions DME instruction;Therapeutic activities;Patient/family education;Therapeutic exercise;Balance training;Wheelchair mobility training;Functional mobility training    PT Goals (Current goals can be found in the Care Plan section)  Acute Rehab PT Goals Patient Stated Goal: To go home PT Goal Formulation: With patient Time For Goal Achievement: 01/22/16 Potential to Achieve Goals: Fair    Frequency Min 3X/week   Barriers to discharge        Co-evaluation PT/OT/SLP Co-Evaluation/Treatment: Yes Reason for Co-Treatment: For patient/therapist safety PT goals addressed during session: Mobility/safety with mobility;Balance         End of Session   Activity Tolerance: Patient limited by pain Patient left: in bed;with call bell/phone within reach;with bed alarm set           Time: 1015-1043 PT Time Calculation (min) (ACUTE ONLY): 28 min   Charges:   PT Evaluation $PT Eval Moderate Complexity: 1 Procedure     PT G CodesReginia Naas January 29, 2016, 11:37 AM  Magda Kiel, [place] January 29, 2016

## 2016-01-08 NOTE — Progress Notes (Signed)
Inpatient Rehabilitation  I attempted to discuss a potential CIR admission with the patient, however he refused to answer any questions regarding home support. He closed his eyes and ceased communication with me.  I left informational booklets and told him I would return at a later date to see if he is interested in discussing plans for some type of rehab. I discussed the need for a back up plan with Lawerance Sabalebbie Swist, RNCM.   Please call if questions.  Weldon PickingSusan Lennon Boutwell PT Inpatient Rehab Admissions Coordinator Cell [phone] Office [phone]

## 2016-01-09 LAB — GLUCOSE, CAPILLARY
GLUCOSE-CAPILLARY: 90 mg/dL (ref 65–99)
Glucose-Capillary: 137 mg/dL — ABNORMAL HIGH (ref 65–99)
Glucose-Capillary: 114 mg/dL — ABNORMAL HIGH (ref 65–99)
Glucose-Capillary: 138 mg/dL — ABNORMAL HIGH (ref 65–99)

## 2016-01-09 LAB — CBC
HEMATOCRIT: 30.6 % — AB (ref 39.0–52.0)
HEMOGLOBIN: 9.7 g/dL — AB (ref 13.0–17.0)
MCH: 29 pg (ref 26.0–34.0)
MCHC: 31.7 g/dL (ref 30.0–36.0)
MCV: 91.3 fL (ref 78.0–100.0)
Platelets: 270 10*3/uL (ref 150–400)
RBC: 3.35 MIL/uL — AB (ref 4.22–5.81)
RDW: 13.7 % (ref 11.5–15.5)
WBC: 15 10*3/uL — ABNORMAL HIGH (ref 4.0–10.5)

## 2016-01-09 LAB — BASIC METABOLIC PANEL
Anion gap: 4 — ABNORMAL LOW (ref 5–15)
BUN: 12 mg/dL (ref 6–20)
CALCIUM: 8.3 mg/dL — AB (ref 8.9–10.3)
CHLORIDE: 106 mmol/L (ref 101–111)
CO2: 22 mmol/L (ref 22–32)
Creatinine, Ser: 1.34 mg/dL — ABNORMAL HIGH (ref 0.61–1.24)
GFR calc Af Amer: >60 mL/min (ref >60)
GFR, EST NON AFRICAN AMERICAN: 53 mL/min — AB (ref >60)
Glucose, Bld: 135 mg/dL — ABNORMAL HIGH (ref 65–99)
POTASSIUM: 4.7 mmol/L (ref 3.5–5.1)
SODIUM: 132 mmol/L — AB (ref 135–145)

## 2016-01-09 LAB — PROTIME-INR
INR: 2.21
PROTHROMBIN TIME: 24.9 s — AB (ref 11.4–15.2)

## 2016-01-09 LAB — VANCOMYCIN, TROUGH: VANCOMYCIN TR: 23 ug/mL — AB (ref 15–20)

## 2016-01-09 MED ORDER — VANCOMYCIN HCL 10 G IV SOLR
1250.0000 mg | INTRAVENOUS | Status: DC
  Administered 2016-01-10 – 2016-01-13 (×4): 1250 mg via INTRAVENOUS
  Filled 2016-01-09 (×4): qty 1250

## 2016-01-09 MED ORDER — PHYTONADIONE 5 MG PO TABS
5.0000 mg | ORAL_TABLET | Freq: Once | ORAL | Status: AC
  Administered 2016-01-09: 5 mg via ORAL
  Filled 2016-01-09: qty 1

## 2016-01-09 NOTE — Progress Notes (Signed)
PASRR#: [id number] [id number]  CSW had to call [place] [facility] because patient's SSN# did not match the one they had on file. Patient stated he did not know his SSN and did not have any insurance cards with him to check. CSW confirmed with financial counseling that SSN on file was correct. CSW then called [place] to discuss how to get a PASRR. They looked him up and had [facility] [place] [facility] instructed CSW to use PASRR on file which is listed above.  Osborne Cascoadia Treg Diemer LCSWA [phone]

## 2016-01-09 NOTE — Progress Notes (Addendum)
[facility] [place]  Subjective  - No new complaints.   Objective 114/62 81 98 F (36.7 C) (Oral) 18 99%  Intake/Output Summary (Last 24 hours) at 01/09/16 0754 Last data filed at 01/09/16 [id number]  Gross per 24 hour  Intake              487 ml  Output              275 ml  Net              212 ml    Right AKA stump incision C/D/I Dry dressing applied   Assessment/Planning: POD # 2 right AKA  Ordered sock from biotech  Well healing AKA He has been pre-op for angiogram tomorrow  By Dr. Diamond NickelBrabham  [facility]onald [facility] 01/09/2016 7:54 AM --  Laboratory Lab Results:  Recent Labs  01/08/16 1036 01/09/16 0516  WBC 18.4* 15.0*  HGB 11.1* 9.7*  HCT 34.7* 30.6*  PLT 334 270   BMET  Recent Labs  01/08/16 1036 01/09/16 0516  NA 131* 132*  K 4.5 4.7  CL 105 106  CO2 19* 22  GLUCOSE 151* 135*  BUN 12 12  CREATININE 1.29* 1.34*  CALCIUM 8.5* 8.3*    COAG Lab Results  Component Value Date   INR 2.21 01/09/2016   INR 1.61 01/08/2016   INR 1.25 01/07/2016   No results found for: PTT

## 2016-01-09 NOTE — Progress Notes (Signed)
Pharmacy Antibiotic Note  Ronald Simpson is a 70 y.o. male admitted on 12/26/2015 with sepsis related to a wound infection. Pharmacy was consulted for vancomycin and Rocephin dosing.   Day #12 of broad spectrum abx for infectious gastroenteritis / infected left foot wound. Dry gangrene noted on left second and fourth toes. 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 Bosie HelperEarly's note. WBC still elevated at 15. SCr today is 1.34 and is trending up. VT was 23 (drawn appropriately). Plan to continue IV antibiotics.  Plan: Start vancomycin 1250 mg IV q24h beginning at 0600 on 01/10/16 Rocephin 2gm IV q24h Monitor clinical picture and renal function Obtain vancomycin trough at steady state F/U abx deescalation / LOT planned  Height: 5\' 10"  (177.8 cm) Weight: 159 lb 11.2 oz (72.4 kg) IBW/kg (Calculated) : 73  Temp (24hrs), Avg:100.2 F (37.9 C), Min:98 F (36.7 C), Max:101.6 F (38.7 C)   Recent Labs Lab 01/03/16 0537  01/04/16 0539  01/06/16 0512 01/06/16 1913 01/07/16 0732 01/08/16 1036 01/09/16 0516  WBC 13.3*  --  11.1*  < > 9.8 11.2* 11.9* 18.4* 15.0*  CREATININE 1.26*  --   --   --   --   --  1.10 1.29* 1.34*  VANCOTROUGH  --   < > 17  --   --   --   --   --  23*  < > = values in this interval not displayed.  Estimated Creatinine Clearance: 54 mL/min (by C-G formula based on SCr of 1.34 mg/dL (H)).    Not on File  Antimicrobials this admission: Rocephin 9/14  >> 9/15, restarted 9/26 >> Vancomycin 9/13 x 1, 9/16 >> Zosyn 9/13 x 1, 9/16 >> 9/26  Levels/Adjustments: 9/18 VT 20 (drawn 1 hr late) 9/21 VT 38 (drawn while vancomycin was infusing) 9/22 VT 17 9/27 VT 23  Microbiology results: 9/13 BCx: ngF 9/13 UCx: 30,000 candida albicans 9/15 C-diff neg 9/15- GI panel showed enteropathic ecoli 9/16 Wound culture: MRSA & serratia marcescens (R-ancef) 9/21 Leg abscess: neg 9/21 MRSA PCR positive 9/25 R stump wound:  Thank you for allowing pharmacy  to be a part of this patient's care.  Katie Shelaine Frie, Student-PharmD 01/09/2016 11:53 AM

## 2016-01-09 NOTE — Clinical Social Work Note (Signed)
Clinical Social Worker continuing to follow patient for support and discharge planning needs.  Upon CSW arrival, patient requested return at a later time.  CSW explained that bed offers were available and left them at bedside for patient to review at a better time.  CSW to follow up with patient to determine bed choice.  CSW remains available for support and to facilitate patient discharge needs once medically stable.  Macario GoldsJesse Zygmunt Mcglinn, Kentucky[place]CS[place] [phone]

## 2016-01-09 NOTE — Progress Notes (Signed)
Orthopedic Tech Progress Note Patient Details:  [name] Hasegawa 04/02/1946 [id number]  Patient ID: [name] [place], male   DOB: 04/02/1946, 70 y.o.   MRN: [id number]   Nikki DomCrawford, Oaklan Persons 01/09/2016, 9:40 AM Called in bio-tech brace order; [facility]

## 2016-01-09 NOTE — Progress Notes (Signed)
Inpatient Rehabilitation  Per [facility] note from today, pt. has had pre-op for angiogram tomorrow. Pt. did not want to discuss rehab today, stating he felt poorly.  Fae PippinMelissa Bowie will follow up Thursday/Friday in my absence and can be reached at [phone].  Please call if questions.  Weldon PickingSusan Emilina Smarr PT Inpatient Rehab Admissions Coordinator Cell [phone] Office [phone]

## 2016-01-09 NOTE — Progress Notes (Signed)
PT Cancellation Note  Patient Details Name: Ronald Simpson MRN: [id number] DOB: [birth year]   Cancelled Treatment:    Reason Eval/Treat Not Completed: Patient declined, no reason specifiedPt educated on importance of OOB mobility and agreed to attempt transfer OOB tomorrow. PT will continue to follow acutely.    Derek MoundKellyn R Elgin Carn Sander Speckman, PTA Pager: [phone]   01/09/2016, 3:59 PM

## 2016-01-09 NOTE — Progress Notes (Signed)
[facility]  PROGRESS NOTE  Ronald Simpson ZOX:[id number] DOB: Jun 17, 1945 DOA: 12/26/2015 PCP: [name], MD   Brief HPI:  70 y.o. with a history of CAD status post CABG in 2016, PVD s/p R BLA, history of CVA, diabetes mellitus type 2, hepatitis C, Pafib on warfarin. He presented with symptoms of malaise for 4 days. He was found to have sepsis, from the left foot.  Started on broad-spectrum antibiotics, ABI was nondiagnostic, GI pathogen panel was collected on admission due to initial diarrhea which came back positive for EPEC, diarrhea improved and resolved now. Left foot showed gangrenous changes in the second and fourth toe and subsequently she CT scan showed subcutaneous edema in left foot. he has history of R BKA in [place] in May earlier this year, now moved to [place] to be with his mother. Vascular surgery was consulted were, it was felt that he may need revision of his R BKA due to wound with purulence near stump and arteriogram to evaluate his left leg -gangrenous toes. Pt has gone back and forth with his discussions with vascular surgery and finally agreed to undergo R AKA, this was completed 9/25,  awaiting VVS plans regarding Arteriogram of LLE   Subjective   Feels ok, eating breakfast, upset abt scrotal swelling   Assessment/Plan:   1. Sepsis- from wound with purulence at R BKA stump and gangrenous changes in 2 toes of L foot       -CT with subcutaneous edema around ankle tracking in the dorsum of the foot and along the ball of the foot.       -ABI inconclusive due to non compressible vessels       -previous BKA in [facility] in May       -Wound Cx from L foot wound: polymicrobial including MRSA and Seratia marcescens       -on IV Vancomycin and Zosyn-Day 13, change to Vancomycin and Ceftraixone on 01/09/2016, d/w Dr.Early today he recommends few more days of Abx for his R leg       -Arterial Duplex from [facility] on 9/7 in Care everywhere/EPIC: notes  hemodynamically significant stenosis in L superficial fem artery/anterior tibial and proximal R SFA      -Appreciate VVS consult, s/p R AKA on 9/25 and will need arteriogram planned for LLE per VVS      -Arteriogram scheduled for Thurs 01/10/2016, INR=2.21, will give 5 mg PO Vit K x 1 and continue holding Warfarin  1. Enteropathogenic Ecoli Diarrhea     -diarrhea resolved, GI pathogen panel showed enteropathogenic Escherichia coli.      -supportive care  3.   History of CAD- stable, continue Coreg  2. Hypertension- -stable  3. Hypothyroidism-continue Synthroid  4. History of hepatitis C - Cirrhotic appearance of liver on CT and albumin is 1.6  5. Diabetes mellitus- - continue Lantus and sliding scale insulin with NovoLog. Blood glucose moderately controlled.  7.   Paroxysmal Afib - On anticoagulation with warfarin --pt and family unaware why he was on warfarin, I was able to contact his PCP (Dr.Schneider) who was able to review his Cardiologists note from EMR from 2015 which notes P.Afib as reason for anticoagulation -warfarin on held for BKA and now for arteriogram, resume warfarin post arteriogram  DVT prophylaxis: On Heparin for DVT proph, warfarin on hold  Code Status: Full code Family Communication: No family at bedside, called and d/w mother 9/25 Disposition Plan: SNF pending Surgical plans   Consultants:  VVS  Procedures:  R AKA on 9/25 Dr.Early  Antibiotics:   Anti-infectives    Start     Dose/Rate Route Frequency Ordered Stop   01/10/16 0600  vancomycin (VANCOCIN) 1,250 mg in sodium chloride 0.9 % 250 mL IVPB     1,250 mg 166.7 mL/hr over 90 Minutes Intravenous Every 24 hours 01/09/16 1214     01/08/16 1500  cefTRIAXone (ROCEPHIN) 2 g in dextrose 5 % 50 mL IVPB     2 g 100 mL/hr over 30 Minutes Intravenous Every 24 hours 01/08/16 1427     01/03/16 0600  cefUROXime (ZINACEF) 1.5 g in dextrose 5 % 50 mL IVPB     1.5 g 100 mL/hr over 30 Minutes Intravenous  On call to O.R. 01/02/16 1555 01/04/16 0559   01/01/16 0600  vancomycin (VANCOCIN) IVPB 750 mg/150 ml premix  Status:  Discontinued     750 mg 150 mL/hr over 60 Minutes Intravenous Every 12 hours 12/31/15 2047 01/09/16 1214   12/30/15 0600  vancomycin (VANCOCIN) IVPB 1000 mg/200 mL premix  Status:  Discontinued     1,000 mg 200 mL/hr over 60 Minutes Intravenous Every 12 hours 12/29/15 1505 12/31/15 2047   12/29/15 1800  vancomycin (VANCOCIN) IVPB 1000 mg/200 mL premix     1,000 mg 200 mL/hr over 60 Minutes Intravenous  Once 12/29/15 1505 12/29/15 1834   12/29/15 1600  piperacillin-tazobactam (ZOSYN) IVPB 3.375 g  Status:  Discontinued     3.375 g 12.5 mL/hr over 240 Minutes Intravenous Every 8 hours 12/29/15 1505 01/08/16 1338   12/27/15 2200  vancomycin (VANCOCIN) IVPB 1000 mg/200 mL premix  Status:  Discontinued     1,000 mg 200 mL/hr over 60 Minutes Intravenous Every 24 hours 12/27/15 0146 12/27/15 0339   12/27/15 1000  vancomycin (VANCOCIN) 50 mg/mL oral solution 125 mg  Status:  Discontinued     125 mg Oral 4 times daily 12/27/15 0053 12/27/15 0327   12/27/15 0600  piperacillin-tazobactam (ZOSYN) IVPB 3.375 g  Status:  Discontinued     3.375 g 12.5 mL/hr over 240 Minutes Intravenous Every 8 hours 12/27/15 0146 12/27/15 0339   12/27/15 0600  cefTRIAXone (ROCEPHIN) 2 g in dextrose 5 % 50 mL IVPB  Status:  Discontinued     2 g 100 mL/hr over 30 Minutes Intravenous Every 24 hours 12/27/15 0339 12/28/15 1830   12/26/15 2330  piperacillin-tazobactam (ZOSYN) IVPB 3.375 g     3.375 g 100 mL/hr over 30 Minutes Intravenous  Once 12/26/15 2318 12/27/15 0030   12/26/15 2100  vancomycin (VANCOCIN) IVPB 1000 mg/200 mL premix     1,000 mg 200 mL/hr over 60 Minutes Intravenous  Once 12/26/15 2046 12/26/15 2348       Objective   Vitals:   01/08/16 2143 01/08/16 2202 01/09/16 0012 01/09/16 0518  BP: (!) 135/7 135/74  114/62  Pulse:  (!) 102  81  Resp:  16  18  Temp:  (!) 101.6 F (38.7  C) 100 F (37.8 C) 98 F (36.7 C)  TempSrc:  Oral Oral Oral  SpO2:  98%  99%  Weight:      Height:        Intake/Output Summary (Last 24 hours) at 01/09/16 1338 Last data filed at 01/09/16 1000  Gross per 24 hour  Intake              967 ml  Output              275 ml  Net              692 ml   Filed Weights   12/27/15 0136  Weight: 72.4 kg (159 lb 11.2 oz)     Physical Examination:  General exam: Appears calm and comfortable, AAOx2, no distress, chronically ill appearing Respiratory system: Clear to auscultation. Respiratory effort normal. Cardiovascular system:  RRR. No  murmurs, rubs, gallops. No pedal edema. GI system: Abdomen is nondistended, soft and nontender. No organomegaly.  Central nervous system. No focal neurological deficits. Musculoskeletal- R AKA with dressing Skin: Dry gangrene noted on the medial aspect of the second and fourth toe, minimal pus noted was from black eschar on deep palpation. Tender to palpation R AKA with dressing Psychiatry: normal affect    Data Reviewed: I have personally reviewed following labs and imaging studies  CBG:  Recent Labs Lab 01/08/16 1146 01/08/16 1710 01/08/16 2256 01/09/16 0805 01/09/16 1208  GLUCAP 154* 144* 183* 137* 90    CBC:  Recent Labs Lab 01/06/16 0512 01/06/16 1913 01/07/16 0732 01/08/16 1036 01/09/16 0516  WBC 9.8 11.2* 11.9* 18.4* 15.0*  HGB 7.7* 9.8* 10.2* 11.1* 9.7*  HCT 24.5* 29.8* 31.4* 34.7* 30.6*  MCV 91.1 89.5 90.0 91.3 91.3  PLT 288 276 294 334 270    Basic Metabolic Panel:  Recent Labs Lab 01/03/16 0537 01/07/16 0732 01/08/16 1036 01/09/16 0516  NA 130* 132* 131* 132*  K 4.1 4.4 4.5 4.7  CL 104 107 105 106  CO2 20* 21* 19* 22  GLUCOSE 129* 132* 151* 135*  BUN 14 12 12 12   CREATININE 1.26* 1.10 1.29* 1.34*  CALCIUM 8.2* 8.4* 8.5* 8.3*    Recent Results (from the past 240 hour(s))  Surgical pcr screen     Status: Abnormal   Collection Time: 01/03/16  1:50 AM   Result Value Ref Range Status   MRSA, PCR POSITIVE (A) NEGATIVE Final    Comment: RESULT CALLED TO, READ BACK BY AND VERIFIED WITH: N WYLIE,RN @0645  01/03/16 MKELLY,MLT    Staphylococcus aureus POSITIVE (A) NEGATIVE Final    Comment:        The Xpert SA Assay (FDA approved for NASAL specimens in patients over 13 years of age), is one component of a comprehensive surveillance program.  Test performance has been validated by [facility] for patients greater than or equal to 73 year old. It is not intended to diagnose infection nor to guide or monitor treatment.   Aerobic Culture (superficial specimen)     Status: None   Collection Time: 01/03/16 12:56 PM  Result Value Ref Range Status   Specimen Description ABSCESS LEG RIGHT  Final   Special Requests DISCHARGE RBKA  Final   Gram Stain   Final    RARE WBC PRESENT, PREDOMINANTLY PMN NO ORGANISMS SEEN    Culture NO GROWTH 2 DAYS  Final   Report Status 01/06/2016 FINAL  Final  Aerobic/Anaerobic Culture (surgical/deep wound)     Status: None (Preliminary result)   Collection Time: 01/07/16 12:17 PM  Result Value Ref Range Status   Specimen Description WOUND FLUID ON SWAB  Final   Special Requests RIGHT BELOW KNEE AMPUTATION  Final   Gram Stain   Final    MODERATE WBC PRESENT, PREDOMINANTLY PMN NO ORGANISMS SEEN    Culture   Final    NO GROWTH 2 DAYS NO ANAEROBES ISOLATED; CULTURE IN PROGRESS FOR 5 DAYS   Report Status PENDING  Incomplete     Liver Function Tests:  Recent Labs Lab 01/07/16 0732  AST 28  ALT 19  ALKPHOS 165*  BILITOT 0.8  PROT 6.9  ALBUMIN 1.5*   No results for input(s): LIPASE, AMYLASE in the last 168 hours. No results for input(s): AMMONIA in the last 168 hours.  Cardiac Enzymes: No results for input(s): CKTOTAL, CKMB, CKMBINDEX, TROPONINI in the last 168 hours. BNP (last 3 results) No results for input(s): BNP in the last 8760 hours.  ProBNP (last 3 results) No results for input(s):  PROBNP in the last 8760 hours.    Studies: No results found.  Scheduled Meds: . amitriptyline  25 mg Oral QHS  . amLODipine  10 mg Oral Daily  . atorvastatin  40 mg Oral Daily  . carvedilol  6.25 mg Oral BID WC  . cefTRIAXone (ROCEPHIN)  IV  2 g Intravenous Q24H  . docusate sodium  100 mg Oral Daily  . feeding supplement (GLUCERNA SHAKE)  237 mL Oral TID BM  . ferrous sulfate  325 mg Oral BID WC  . heparin subcutaneous  5,000 Units Subcutaneous Q8H  . hydrALAZINE  25 mg Oral TID  . insulin aspart  0-15 Units Subcutaneous TID WC  . insulin aspart  0-5 Units Subcutaneous QHS  . insulin glargine  17 Units Subcutaneous QHS  . levothyroxine  25 mcg Oral QAC breakfast  . multivitamin with minerals  1 tablet Oral Daily  . pantoprazole  40 mg Oral Daily  . pregabalin  100 mg Oral Daily   And  . pregabalin  150 mg Oral BID  . [START ON 01/10/2016] vancomycin  1,250 mg Intravenous Q24H    Continuous Infusions:    Time spent: 25 min  Ronald Simpson   [facility] Pager [phone] If 7PM-7AM, please contact night-coverage at [email or web address], Office  [phone]  password [email or web address] 01/09/2016, 1:38 PM  LOS: 14 days

## 2016-01-09 NOTE — Progress Notes (Signed)
Orthopedic Tech Progress Note Patient Details:  Ronald Simpson 06-02-1945 [id number] Brace order completed by bio-tech vendor Patient ID: Ronald Simpson, male   DOB: 06-02-1945, 70 y.o.   MRN: [id number]   Jennye MoccasinHughes, Amandine Covino Craig 01/09/2016, 3:10 PM

## 2016-01-10 ENCOUNTER — Encounter (HOSPITAL_COMMUNITY)
Admission: EM | Disposition: A | Discharge status: Skilled Nursing Facility | Payer: Self-pay | Source: Home / Self Care | Attending: Internal Medicine

## 2016-01-10 LAB — PROTIME-INR
INR: 2.2
INR: 1.78
Prothrombin Time: 24.8 seconds — ABNORMAL HIGH (ref 11.4–15.2)
Prothrombin Time: 20.9 seconds — ABNORMAL HIGH (ref 11.4–15.2)

## 2016-01-10 LAB — BASIC METABOLIC PANEL
Anion gap: 3 — ABNORMAL LOW (ref 5–15)
BUN: 14 mg/dL (ref 6–20)
CHLORIDE: 106 mmol/L (ref 101–111)
CO2: 22 mmol/L (ref 22–32)
Calcium: 8.4 mg/dL — ABNORMAL LOW (ref 8.9–10.3)
Creatinine, Ser: 1.57 mg/dL — ABNORMAL HIGH (ref 0.61–1.24)
GFR calc non Af Amer: 44 mL/min — ABNORMAL LOW (ref >60)
GFR, EST AFRICAN AMERICAN: 51 mL/min — AB (ref >60)
Glucose, Bld: 126 mg/dL — ABNORMAL HIGH (ref 65–99)
POTASSIUM: 4.7 mmol/L (ref 3.5–5.1)
SODIUM: 131 mmol/L — AB (ref 135–145)

## 2016-01-10 LAB — CBC
HEMATOCRIT: 31 % — AB (ref 39.0–52.0)
Hemoglobin: 9.8 g/dL — ABNORMAL LOW (ref 13.0–17.0)
MCH: 29 pg (ref 26.0–34.0)
MCHC: 31.6 g/dL (ref 30.0–36.0)
MCV: 91.7 fL (ref 78.0–100.0)
Platelets: 254 10*3/uL (ref 150–400)
RBC: 3.38 MIL/uL — ABNORMAL LOW (ref 4.22–5.81)
RDW: 13.8 % (ref 11.5–15.5)
WBC: 11.6 10*3/uL — AB (ref 4.0–10.5)

## 2016-01-10 LAB — GLUCOSE, CAPILLARY
GLUCOSE-CAPILLARY: 115 mg/dL — AB (ref 65–99)
GLUCOSE-CAPILLARY: 92 mg/dL (ref 65–99)
Glucose-Capillary: 84 mg/dL (ref 65–99)
Glucose-Capillary: 85 mg/dL (ref 65–99)

## 2016-01-10 SURGERY — LOWER EXTREMITY ANGIOGRAPHY

## 2016-01-10 MED ORDER — FUROSEMIDE 10 MG/ML IJ SOLN
40.0000 mg | Freq: Once | INTRAMUSCULAR | Status: AC
  Administered 2016-01-10: 40 mg via INTRAVENOUS
  Filled 2016-01-10: qty 4

## 2016-01-10 MED ORDER — SODIUM CHLORIDE 0.9 % IV SOLN: Freq: Once | INTRAVENOUS | Status: DC

## 2016-01-10 NOTE — Progress Notes (Signed)
Temp 100.5 md notified and is ok with proceeding with blood.   Brandon Scarbrough, Charlaine DaltonAnn Brooke RN

## 2016-01-10 NOTE — Progress Notes (Signed)
[facility]  PROGRESS NOTE  Ronald Simpson ZOX:[id number] DOB: April 01, 1946 DOA: 12/26/2015 PCP: [name], MD   Brief HPI:  70 y.o. with a history of CAD status post CABG in 2016, PVD s/p R BLA, history of CVA, diabetes mellitus type 2, hepatitis C, Pafib on warfarin. He presented with symptoms of malaise for 4 days. He was found to have sepsis, from the left foot.  Started on broad-spectrum antibiotics, ABI was nondiagnostic, GI pathogen panel was collected on admission due to initial diarrhea which came back positive for EPEC, diarrhea improved and resolved now. Left foot showed gangrenous changes in the second and fourth toe and subsequently she CT scan showed subcutaneous edema in left foot. 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 Vascular surgery was consulted were, it was felt that he may need revision of his R BKA due to wound with purulence near stump and arteriogram to evaluate his left leg -gangrenous toes. Pt has gone back and forth with his discussions with vascular surgery and finally agreed to undergo R AKA, this was completed 9/25,  awaiting VVS plans regarding Arteriogram of LLE   Subjective   He expresses concerns over scrotal edema, denies chest pain, shortness of breath, nausea, vomiting   Assessment/Plan:   1. Sepsis- from wound with purulence at R BKA stump and gangrenous changes in 2 toes of L foot       -CT with subcutaneous edema around ankle tracking in the dorsum of the foot and along the ball of the foot.       -ABI inconclusive due to non compressible vessels       -previous BKA in [facility] in May       -Wound Cx from L foot wound: polymicrobial including MRSA and Seratia marcescens       -on IV Vancomycin and Zosyn-Day 13, change to Vancomycin and Ceftraixone on 01/09/2016, d/w Dr.Early today he recommends few more days of Abx for his R leg       -Arterial Duplex from [facility] on 9/7 in Care everywhere/EPIC: notes hemodynamically significant stenosis in L superficial fem artery/anterior tibial and proximal R SFA      -Appreciate VVS consult, s/p R AKA on 9/25 and will need arteriogram planned for LLE per VVS      -Had been scheduled for Arteriogram scheduled for Thurs 01/10/2016, however, INR=2.21, despite giving 5 mg PO of Vit K on 01/09/2016. VVS giving FFP as INR is too high for procedure to be done.   1. Enteropathogenic Ecoli Diarrhea     -diarrhea resolved, GI pathogen panel showed enteropathogenic Escherichia coli.      -supportive care  3.   History of CAD- stable, continue Coreg  2. Hypertension- -stable  3. Hypothyroidism-continue Synthroid  4. History of hepatitis C - Cirrhotic appearance of liver on CT and albumin is 1.6 -Complaining of worsening edema  -Will give 40 mg of IV Lasix 1 today  5. Diabetes mellitus- - continue Lantus and sliding scale insulin with NovoLog. Blood glucose moderately controlled.  7.   Paroxysmal Afib - On anticoagulation with warfarin --pt and family unaware why he was on warfarin, I was able to contact his PCP (Dr.Schneider) who was able to review his Cardiologists note from EMR from 2015 which notes P.Afib as reason for anticoagulation -warfarin on held for BKA and now for arteriogram, resume warfarin post arteriogram  8.  Volume  overload/Cirrhosis -Physical exam appears volume overloaded with significant scrotal edema -Previous CT scan of abdomen performed on 12/27/2015 showing findings consistent with cirrhosis. Labs showing albumen level I.5 on 01/07/2016 -Monitor kidney function -Will provide 40 mg of IV Lasix 1  DVT prophylaxis: On Heparin for DVT proph, warfarin on hold  Code Status: Full code Family Communication: No family at bedside, called and d/w mother 9/25 Disposition Plan: SNF pending Surgical plans   Consultants:  VVS   Procedures:  R AKA on 9/25 Dr.Early  Antibiotics:    Anti-infectives    Start     Dose/Rate Route Frequency Ordered Stop   01/10/16 0600  vancomycin (VANCOCIN) 1,250 mg in sodium chloride 0.9 % 250 mL IVPB     1,250 mg 166.7 mL/hr over 90 Minutes Intravenous Every 24 hours 01/09/16 1214     01/08/16 1500  cefTRIAXone (ROCEPHIN) 2 g in dextrose 5 % 50 mL IVPB     2 g 100 mL/hr over 30 Minutes Intravenous Every 24 hours 01/08/16 1427     01/03/16 0600  cefUROXime (ZINACEF) 1.5 g in dextrose 5 % 50 mL IVPB     1.5 g 100 mL/hr over 30 Minutes Intravenous On call to O.R. 01/02/16 1555 01/04/16 0559   01/01/16 0600  vancomycin (VANCOCIN) IVPB 750 mg/150 ml premix  Status:  Discontinued     750 mg 150 mL/hr over 60 Minutes Intravenous Every 12 hours 12/31/15 2047 01/09/16 1214   12/30/15 0600  vancomycin (VANCOCIN) IVPB 1000 mg/200 mL premix  Status:  Discontinued     1,000 mg 200 mL/hr over 60 Minutes Intravenous Every 12 hours 12/29/15 1505 12/31/15 2047   12/29/15 1800  vancomycin (VANCOCIN) IVPB 1000 mg/200 mL premix     1,000 mg 200 mL/hr over 60 Minutes Intravenous  Once 12/29/15 1505 12/29/15 1834   12/29/15 1600  piperacillin-tazobactam (ZOSYN) IVPB 3.375 g  Status:  Discontinued     3.375 g 12.5 mL/hr over 240 Minutes Intravenous Every 8 hours 12/29/15 1505 01/08/16 1338   12/27/15 2200  vancomycin (VANCOCIN) IVPB 1000 mg/200 mL premix  Status:  Discontinued     1,000 mg 200 mL/hr over 60 Minutes Intravenous Every 24 hours 12/27/15 0146 12/27/15 0339   12/27/15 1000  vancomycin (VANCOCIN) 50 mg/mL oral solution 125 mg  Status:  Discontinued     125 mg Oral 4 times daily 12/27/15 0053 12/27/15 0327   12/27/15 0600  piperacillin-tazobactam (ZOSYN) IVPB 3.375 g  Status:  Discontinued     3.375 g 12.5 mL/hr over 240 Minutes Intravenous Every 8 hours 12/27/15 0146 12/27/15 0339   12/27/15 0600  cefTRIAXone (ROCEPHIN) 2 g in dextrose 5 % 50 mL IVPB  Status:  Discontinued     2 g 100 mL/hr over 30 Minutes Intravenous Every 24 hours  12/27/15 0339 12/28/15 1830   12/26/15 2330  piperacillin-tazobactam (ZOSYN) IVPB 3.375 g     3.375 g 100 mL/hr over 30 Minutes Intravenous  Once 12/26/15 2318 12/27/15 0030   12/26/15 2100  vancomycin (VANCOCIN) IVPB 1000 mg/200 mL premix     1,000 mg 200 mL/hr over 60 Minutes Intravenous  Once 12/26/15 2046 12/26/15 2348       Objective   Vitals:   01/10/16 1210 01/10/16 1332 01/10/16 1411 01/10/16 1427  BP:  (!) 145/74 (!) 152/82 134/78  Pulse:  96 96 96  Resp:  16 16   Temp: 100.3 F (37.9 C) 99.9 F (37.7 C) (!) 100.5 F (38.1 C) 100.3  F (37.9 C)  TempSrc:  Oral Oral Oral  SpO2:  99% 100% 100%  Weight:      Height:        Intake/Output Summary (Last 24 hours) at 01/10/16 1602 Last data filed at 01/10/16 1211  Gross per 24 hour  Intake              500 ml  Output             1900 ml  Net            -1400 ml   Filed Weights   12/27/15 0136  Weight: 72.4 kg (159 lb 11.2 oz)     Physical Examination:  General exam: Appears calm and comfortable, AAOx2, no distress, chronically ill appearing Respiratory system: Clear to auscultation. Respiratory effort normal. Cardiovascular system:  RRR. No  murmurs, rubs, gallops. No pedal edema. GI system: Abdomen is nondistended, soft and nontender. No organomegaly.  Central nervous system. No focal neurological deficits. Musculoskeletal- R AKA with dressing Skin: Dry gangrene noted on the medial aspect of the second and fourth toe, minimal pus noted was from black eschar on deep palpation. Tender to palpation R AKA with dressing Psychiatry: normal affect    Data Reviewed: I have personally reviewed following labs and imaging studies  CBG:  Recent Labs Lab 01/09/16 1208 01/09/16 1745 01/09/16 2118 01/10/16 0745 01/10/16 1204  GLUCAP 90 114* 138* 115* 92    CBC:  Recent Labs Lab 01/06/16 1913 01/07/16 0732 01/08/16 1036 01/09/16 0516 01/10/16 0532  WBC 11.2* 11.9* 18.4* 15.0* 11.6*  HGB 9.8* 10.2*  11.1* 9.7* 9.8*  HCT 29.8* 31.4* 34.7* 30.6* 31.0*  MCV 89.5 90.0 91.3 91.3 91.7  PLT 276 294 334 270 254    Basic Metabolic Panel:  Recent Labs Lab 01/07/16 0732 01/08/16 1036 01/09/16 0516 01/10/16 0532  NA 132* 131* 132* 131*  K 4.4 4.5 4.7 4.7  CL 107 105 106 106  CO2 21* 19* 22 22  GLUCOSE 132* 151* 135* 126*  BUN 12 12 12 14   CREATININE 1.10 1.29* 1.34* 1.57*  CALCIUM 8.4* 8.5* 8.3* 8.4*    Recent Results (from the past 240 hour(s))  Surgical pcr screen     Status: Abnormal   Collection Time: 01/03/16  1:50 AM  Result Value Ref Range Status   MRSA, PCR POSITIVE (A) NEGATIVE Final    Comment: RESULT CALLED TO, READ BACK BY AND VERIFIED WITH: N WYLIE,RN @0645  01/03/16 MKELLY,MLT    Staphylococcus aureus POSITIVE (A) NEGATIVE Final    Comment:        The Xpert SA Assay (FDA approved for NASAL specimens in patients over 109 years of age), is one component of a comprehensive surveillance program.  Test performance has been validated by [facility] for patients greater than or equal to 110 year old. It is not intended to diagnose infection nor to guide or monitor treatment.   Aerobic Culture (superficial specimen)     Status: None   Collection Time: 01/03/16 12:56 PM  Result Value Ref Range Status   Specimen Description ABSCESS LEG RIGHT  Final   Special Requests DISCHARGE RBKA  Final   Gram Stain   Final    RARE WBC PRESENT, PREDOMINANTLY PMN NO ORGANISMS SEEN    Culture NO GROWTH 2 DAYS  Final   Report Status 01/06/2016 FINAL  Final  Aerobic/Anaerobic Culture (surgical/deep wound)     Status: None (Preliminary result)   Collection Time: 01/07/16 12:17 PM  Result Value Ref Range Status   Specimen Description WOUND FLUID ON SWAB  Final   Special Requests RIGHT BELOW KNEE AMPUTATION  Final   Gram Stain   Final    MODERATE WBC PRESENT, PREDOMINANTLY PMN NO ORGANISMS SEEN    Culture   Final    NO GROWTH 2 DAYS NO ANAEROBES ISOLATED; CULTURE IN PROGRESS  FOR 5 DAYS   Report Status PENDING  Incomplete     Liver Function Tests:  Recent Labs Lab 01/07/16 0732  AST 28  ALT 19  ALKPHOS 165*  BILITOT 0.8  PROT 6.9  ALBUMIN 1.5*   No results for input(s): LIPASE, AMYLASE in the last 168 hours. No results for input(s): AMMONIA in the last 168 hours.  Cardiac Enzymes: No results for input(s): CKTOTAL, CKMB, CKMBINDEX, TROPONINI in the last 168 hours. BNP (last 3 results) No results for input(s): BNP in the last 8760 hours.  ProBNP (last 3 results) No results for input(s): PROBNP in the last 8760 hours.    Studies: No results found.  Scheduled Meds: . sodium chloride   Intravenous Once  . amitriptyline  25 mg Oral QHS  . amLODipine  10 mg Oral Daily  . atorvastatin  40 mg Oral Daily  . carvedilol  6.25 mg Oral BID WC  . cefTRIAXone (ROCEPHIN)  IV  2 g Intravenous Q24H  . docusate sodium  100 mg Oral Daily  . feeding supplement (GLUCERNA SHAKE)  237 mL Oral TID BM  . ferrous sulfate  325 mg Oral BID WC  . hydrALAZINE  25 mg Oral TID  . insulin aspart  0-15 Units Subcutaneous TID WC  . insulin aspart  0-5 Units Subcutaneous QHS  . insulin glargine  17 Units Subcutaneous QHS  . levothyroxine  25 mcg Oral QAC breakfast  . multivitamin with minerals  1 tablet Oral Daily  . pantoprazole  40 mg Oral Daily  . pregabalin  100 mg Oral Daily   And  . pregabalin  150 mg Oral BID  . vancomycin  1,250 mg Intravenous Q24H    Continuous Infusions:    Time spent: 35 min  Jeralyn BennettZAMORA, Ronald Simpson   [facility] Pager [phone] If 7PM-7AM, please contact night-coverage at [email or web address], Office  [phone]  password [email or web address] 01/10/2016, 4:02 PM  LOS: 15 days

## 2016-01-10 NOTE — Progress Notes (Signed)
PT Cancellation Note  Patient Details Name: Ronald Simpson MRN: [id number] DOB: 08/16/1945   Cancelled Treatment:    Reason Eval/Treat Not Completed: Patient declined, no reason specified PT will continue to follow acutely.    Derek MoundKellyn R Neriah Brott Shamon Lobo, PTA Pager: [phone]   01/10/2016, 1:25 PM

## 2016-01-10 NOTE — Progress Notes (Signed)
PT Cancellation Note  Patient Details Name: Ronald Simpson MRN: [id number] DOB: 01/04/1946   Cancelled Treatment:    Reason Eval/Treat Not Completed: Patient declined, no reason specified PT will check on pt later as time allows.    Derek MoundKellyn R Zuriah Bordas Jalesa Thien, PTA Pager: [phone]   01/10/2016, 9:42 AM

## 2016-01-10 NOTE — Progress Notes (Signed)
Inpatient Rehabilitation  Continuing to follow patient for timing of medical readiness, activity tolerance/willingness, and rehab bed availability.  Aware that angiogram is now scheduled for tomorrow.  Plan to follow after procedure.    Charlane FerrettiMelissa Colie Fugitt, M.A., CCC/SLP Admission Coordinator  [facility]  Cell [phone]

## 2016-01-10 NOTE — Progress Notes (Signed)
Pt resting in bed. FFP infusing. No current complaints. Call bell and phone within reach. Will continue to monitor.

## 2016-01-10 NOTE — Progress Notes (Signed)
Pt's INR today is 2.2, which is too high for aortogram today.  Will give 2 units of FFP and check INR after infusion as if it is not corrected, he may need more FFP.    Will advance diet today and make npo after MN tonight.  Procedure tomorrow by Dr. Brabham.  Labs in am.   Nike Southers, PAC 01/10/2016 10:54 AM  

## 2016-01-10 NOTE — Progress Notes (Signed)
Dr. Randie Heinzain was notified of patient PT 20.9and INR 1.78. Ilean SkillVeronica Kennidi Yoshida LPN

## 2016-01-11 ENCOUNTER — Encounter (HOSPITAL_COMMUNITY): Payer: Self-pay | Admitting: Surgery

## 2016-01-11 ENCOUNTER — Encounter (HOSPITAL_COMMUNITY)
Admission: EM | Disposition: A | Discharge status: Skilled Nursing Facility | Payer: Self-pay | Source: Home / Self Care | Attending: Internal Medicine

## 2016-01-11 DIAGNOSIS — T879 Unspecified complications of amputation stump: Secondary | ICD-10-CM

## 2016-01-11 DIAGNOSIS — K7469 Other cirrhosis of liver: Secondary | ICD-10-CM

## 2016-01-11 DIAGNOSIS — I739 Peripheral vascular disease, unspecified: Secondary | ICD-10-CM | POA: Diagnosis present

## 2016-01-11 HISTORY — PX: PERIPHERAL VASCULAR CATHETERIZATION: SHX172C

## 2016-01-11 LAB — PROTIME-INR
INR: 1.91
PROTHROMBIN TIME: 22.1 s — AB (ref 11.4–15.2)

## 2016-01-11 LAB — BASIC METABOLIC PANEL
ANION GAP: 4 — AB (ref 5–15)
BUN: 15 mg/dL (ref 6–20)
CALCIUM: 8.4 mg/dL — AB (ref 8.9–10.3)
CHLORIDE: 107 mmol/L (ref 101–111)
CO2: 24 mmol/L (ref 22–32)
CREATININE: 1.55 mg/dL — AB (ref 0.61–1.24)
GFR calc Af Amer: 51 mL/min — ABNORMAL LOW (ref >60)
GFR calc non Af Amer: 44 mL/min — ABNORMAL LOW (ref >60)
GLUCOSE: 101 mg/dL — AB (ref 65–99)
Potassium: 4.1 mmol/L (ref 3.5–5.1)
Sodium: 135 mmol/L (ref 135–145)

## 2016-01-11 LAB — CBC
HCT: 27.5 % — ABNORMAL LOW (ref 39.0–52.0)
Hemoglobin: 8.8 g/dL — ABNORMAL LOW (ref 13.0–17.0)
MCH: 29.3 pg (ref 26.0–34.0)
MCHC: 32 g/dL (ref 30.0–36.0)
MCV: 91.7 fL (ref 78.0–100.0)
PLATELETS: 236 10*3/uL (ref 150–400)
RBC: 3 MIL/uL — ABNORMAL LOW (ref 4.22–5.81)
RDW: 13.8 % (ref 11.5–15.5)
WBC: 7.7 10*3/uL (ref 4.0–10.5)

## 2016-01-11 LAB — PREPARE FRESH FROZEN PLASMA
UNIT DIVISION: 0
Unit division: 0

## 2016-01-11 LAB — GLUCOSE, CAPILLARY
GLUCOSE-CAPILLARY: 79 mg/dL (ref 65–99)
GLUCOSE-CAPILLARY: 99 mg/dL (ref 65–99)
GLUCOSE-CAPILLARY: 220 mg/dL — AB (ref 65–99)
GLUCOSE-CAPILLARY: 132 mg/dL — AB (ref 65–99)
Glucose-Capillary: 92 mg/dL (ref 65–99)

## 2016-01-11 SURGERY — LOWER EXTREMITY ANGIOGRAPHY
Anesthesia: LOCAL

## 2016-01-11 MED ORDER — FENTANYL CITRATE (PF) 100 MCG/2ML IJ SOLN
INTRAMUSCULAR | Status: AC
  Filled 2016-01-11: qty 2

## 2016-01-11 MED ORDER — MIDAZOLAM HCL 2 MG/2ML IJ SOLN
INTRAMUSCULAR | Status: AC
  Filled 2016-01-11: qty 2

## 2016-01-11 MED ORDER — HEPARIN (PORCINE) IN NACL 2-0.9 UNIT/ML-% IJ SOLN
INTRAMUSCULAR | Status: AC
  Filled 2016-01-11: qty 1000

## 2016-01-11 MED ORDER — LABETALOL HCL 5 MG/ML IV SOLN: 10.0000 mg | INTRAVENOUS | Status: DC | PRN

## 2016-01-11 MED ORDER — WARFARIN SODIUM 5 MG PO TABS
7.0000 mg | ORAL_TABLET | Freq: Once | ORAL | Status: AC
  Administered 2016-01-11: 7 mg via ORAL
  Filled 2016-01-11: qty 1

## 2016-01-11 MED ORDER — WARFARIN - PHARMACIST DOSING INPATIENT
Freq: Every day | Status: DC
  Administered 2016-01-11: 18:00:00

## 2016-01-11 MED ORDER — DOCUSATE SODIUM 100 MG PO CAPS: 100.0000 mg | ORAL_CAPSULE | Freq: Every day | ORAL | Status: DC

## 2016-01-11 MED ORDER — BISACODYL 10 MG RE SUPP
10.0000 mg | Freq: Once | RECTAL | Status: AC
  Administered 2016-01-11: 10 mg via RECTAL
  Filled 2016-01-11: qty 1

## 2016-01-11 MED ORDER — ACETAMINOPHEN 325 MG RE SUPP: 325.0000 mg | RECTAL | Status: DC | PRN

## 2016-01-11 MED ORDER — FUROSEMIDE 10 MG/ML IJ SOLN
60.0000 mg | Freq: Once | INTRAMUSCULAR | Status: AC
  Administered 2016-01-11: 60 mg via INTRAVENOUS
  Filled 2016-01-11: qty 6

## 2016-01-11 MED ORDER — HYDRALAZINE HCL 20 MG/ML IJ SOLN
5.0000 mg | INTRAMUSCULAR | Status: DC | PRN
  Filled 2016-01-11: qty 1

## 2016-01-11 MED ORDER — ALUM & MAG HYDROXIDE-SIMETH 200-200-20 MG/5ML PO SUSP: 15.0000 mL | ORAL | Status: DC | PRN

## 2016-01-11 MED ORDER — SODIUM CHLORIDE 0.9 % IV SOLN
INTRAVENOUS | Status: DC
  Administered 2016-01-11: 1000 mL via INTRAVENOUS

## 2016-01-11 MED ORDER — HEPARIN (PORCINE) IN NACL 2-0.9 UNIT/ML-% IJ SOLN
INTRAMUSCULAR | Status: DC | PRN
  Administered 2016-01-11: 1000 mL via INTRA_ARTERIAL

## 2016-01-11 MED ORDER — LIDOCAINE HCL (PF) 1 % IJ SOLN
INTRAMUSCULAR | Status: AC
  Filled 2016-01-11: qty 30

## 2016-01-11 MED ORDER — DOCUSATE SODIUM 100 MG PO CAPS
100.0000 mg | ORAL_CAPSULE | Freq: Two times a day (BID) | ORAL | Status: DC
  Administered 2016-01-11 – 2016-01-14 (×6): 100 mg via ORAL
  Filled 2016-01-11 (×6): qty 1

## 2016-01-11 MED ORDER — METOPROLOL TARTRATE 5 MG/5ML IV SOLN
2.0000 mg | INTRAVENOUS | Status: DC | PRN
Start: 2016-01-11 — End: 2016-01-14

## 2016-01-11 MED ORDER — IODIXANOL 320 MG/ML IV SOLN
INTRAVENOUS | Status: DC | PRN
  Administered 2016-01-11: 86 mL via INTRA_ARTERIAL

## 2016-01-11 MED ORDER — ACETAMINOPHEN 325 MG PO TABS: 325.0000 mg | ORAL_TABLET | ORAL | Status: DC | PRN

## 2016-01-11 MED ORDER — LACTULOSE 10 GM/15ML PO SOLN: 20.0000 g | Freq: Two times a day (BID) | ORAL | Status: DC | PRN

## 2016-01-11 MED ORDER — PHENOL 1.4 % MT LIQD: 1.0000 | OROMUCOSAL | Status: DC | PRN

## 2016-01-11 MED ORDER — FENTANYL CITRATE (PF) 100 MCG/2ML IJ SOLN
INTRAMUSCULAR | Status: DC | PRN
  Administered 2016-01-11 (×3): 25 ug via INTRAVENOUS

## 2016-01-11 MED ORDER — MIDAZOLAM HCL 2 MG/2ML IJ SOLN
INTRAMUSCULAR | Status: DC | PRN
  Administered 2016-01-11 (×3): 1 mg via INTRAVENOUS

## 2016-01-11 MED ORDER — GUAIFENESIN-DM 100-10 MG/5ML PO SYRP: 15.0000 mL | ORAL_SOLUTION | ORAL | Status: DC | PRN

## 2016-01-11 MED ORDER — ONDANSETRON HCL 4 MG/2ML IJ SOLN: 4.0000 mg | Freq: Four times a day (QID) | INTRAMUSCULAR | Status: DC | PRN

## 2016-01-11 MED ORDER — SODIUM CHLORIDE 0.9 % IV SOLN: Freq: Once | INTRAVENOUS | Status: AC

## 2016-01-11 MED ORDER — LIDOCAINE HCL (PF) 1 % IJ SOLN
INTRAMUSCULAR | Status: DC | PRN
  Administered 2016-01-11: 10 mL via SUBCUTANEOUS

## 2016-01-11 SURGICAL SUPPLY — 14 items
CATH OMNI FLUSH 5F 65CM (CATHETERS) ×2 IMPLANT
CATH SOFT-VU 4F 65 STRAIGHT (CATHETERS) ×1 IMPLANT
CATH SOFT-VU STRAIGHT 4F 65CM (CATHETERS) ×1
COVER PRB 48X5XTLSCP FOLD TPE (BAG) ×1 IMPLANT
COVER PROBE 5X48 (BAG) ×1
DEVICE TORQUE H2O (MISCELLANEOUS) ×2 IMPLANT
GUIDEWIRE ANGLED .035X150CM (WIRE) ×2 IMPLANT
KIT MICROINTRODUCER STIFF 5F (SHEATH) ×2 IMPLANT
KIT PV (KITS) ×2 IMPLANT
SHEATH PINNACLE 5F 10CM (SHEATH) ×2 IMPLANT
SYR MEDRAD MARK V 150ML (SYRINGE) ×2 IMPLANT
TRANSDUCER W/STOPCOCK (MISCELLANEOUS) ×2 IMPLANT
TRAY PV CATH (CUSTOM PROCEDURE TRAY) ×2 IMPLANT
WIRE BENTSON .035X145CM (WIRE) ×2 IMPLANT

## 2016-01-11 NOTE — Progress Notes (Signed)
[facility]  PROGRESS NOTE  Ronald Simpson ZOX:[id number] DOB: April 04, 1946 DOA: 12/26/2015 PCP: [name], MD   Brief HPI:  70 y.o. with a history of CAD status post CABG in 2016, PVD s/p R BLA, history of CVA, diabetes mellitus type 2, hepatitis C, Pafib on warfarin. He presented with symptoms of malaise for 4 days. He was found to have sepsis, from the left foot.  Started on broad-spectrum antibiotics, ABI was nondiagnostic, GI pathogen panel was collected on admission due to initial diarrhea which came back positive for EPEC, diarrhea improved and resolved now. Left foot showed gangrenous changes in the second and fourth toe and subsequently she CT scan showed subcutaneous edema in left foot. 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 Vascular surgery was consulted were, it was felt that he may need revision of his R BKA due to wound with purulence near stump and arteriogram to evaluate his left leg -gangrenous toes. Pt has gone back and forth with his discussions with vascular surgery and finally agreed to undergo R AKA, this was completed 9/25,  awaiting VVS plans regarding Arteriogram of LLE   Subjective   He continues to express concern for scrotal edema    Assessment/Plan:   1. Sepsis- from wound with purulence at R BKA stump and gangrenous changes in 2 toes of L foot       -CT with subcutaneous edema around ankle tracking in the dorsum of the foot and along the ball of the foot.       -ABI inconclusive due to non compressible vessels       -previous BKA in [facility] in May       -Wound Cx from L foot wound: polymicrobial including MRSA and Seratia marcescens       -on IV Vancomycin and Zosyn-Day 13, change to Vancomycin and Ceftraixone on 01/09/2016, d/w Dr.Early today he recommends few more days of Abx for his R leg       -Arterial Duplex from [facility] on 9/7 in Care everywhere/EPIC: notes  hemodynamically significant stenosis in L superficial fem artery/anterior tibial and proximal R SFA      -Appreciate VVS consult, s/p R AKA on 9/25 and will need arteriogram planned for LLE per VVS      -Had been scheduled for Arteriogram scheduled for Thurs 01/10/2016, however, INR=2.21, despite giving 5 mg PO of Vit K on 01/09/2016. VVS giving FFP as INR is too high for procedure to be done.        -On 01/11/2016 he underwent Arteriogram that did not reveal significant stenosis within the left superficial femoral and popliteal artery nor stenosis within the aortoiliac section.   1. Enteropathogenic Ecoli Diarrhea     -diarrhea resolved, GI pathogen panel showed enteropathogenic Escherichia coli.      -supportive care  3.   History of CAD- stable, continue Coreg  2. Hypertension- -stable  3. Hypothyroidism-continue Synthroid  4. History of hepatitis C - Cirrhotic appearance of liver on CT and albumin is 1.6 -Complaining of worsening edema  -He is getting diuresed with IV lasix  5. Diabetes mellitus- - continue Lantus and sliding scale insulin with NovoLog. Blood glucose moderately controlled.  7.   Paroxysmal Afib - On anticoagulation with warfarin --pt and family unaware why he was on warfarin, I was able to contact his PCP (Dr.Schneider) who was able to review his Cardiologists note from EMR from  2015 which notes P.Afib as reason for anticoagulation -warfarin on held for BKA and now for arteriogram, resume warfarin post arteriogram  8.  Volume overload/Cirrhosis -Physical exam appears volume overloaded with significant scrotal edema -Previous CT scan of abdomen performed on 12/27/2015 showing findings consistent with cirrhosis. Labs showing albumen level I.5 on 01/07/2016 -On 01/11/2016 continues to have evidence of volume overload with significant scrotal edema, will give 60 mg of IV lasix x 1. His Ins and outs reviewed he has a positive net negative fluid balance of 10.7 liters.     DVT prophylaxis: On Heparin for DVT proph, warfarin on hold  Code Status: Full code Family Communication: No family at bedside, called and d/w mother 9/25 Disposition Plan: SNF pending Surgical plans   Consultants:  VVS   Procedures:  R AKA on 9/25 Dr.Early  Angiogram  Impression: -single vessel runoff via the peroneal artery -no hemodynamically significant stenosis identified within the left superficial femoral and popliteal artery. -no hemodynamically significant stenosis within the aortoiliac section.  Antibiotics:   Anti-infectives    Start     Dose/Rate Route Frequency Ordered Stop   01/10/16 0600  vancomycin (VANCOCIN) 1,250 mg in sodium chloride 0.9 % 250 mL IVPB     1,250 mg 166.7 mL/hr over 90 Minutes Intravenous Every 24 hours 01/09/16 1214     01/08/16 1500  cefTRIAXone (ROCEPHIN) 2 g in dextrose 5 % 50 mL IVPB     2 g 100 mL/hr over 30 Minutes Intravenous Every 24 hours 01/08/16 1427     01/03/16 0600  cefUROXime (ZINACEF) 1.5 g in dextrose 5 % 50 mL IVPB     1.5 g 100 mL/hr over 30 Minutes Intravenous On call to O.R. 01/02/16 1555 01/04/16 0559   01/01/16 0600  vancomycin (VANCOCIN) IVPB 750 mg/150 ml premix  Status:  Discontinued     750 mg 150 mL/hr over 60 Minutes Intravenous Every 12 hours 12/31/15 2047 01/09/16 1214   12/30/15 0600  vancomycin (VANCOCIN) IVPB 1000 mg/200 mL premix  Status:  Discontinued     1,000 mg 200 mL/hr over 60 Minutes Intravenous Every 12 hours 12/29/15 1505 12/31/15 2047   12/29/15 1800  vancomycin (VANCOCIN) IVPB 1000 mg/200 mL premix     1,000 mg 200 mL/hr over 60 Minutes Intravenous  Once 12/29/15 1505 12/29/15 1834   12/29/15 1600  piperacillin-tazobactam (ZOSYN) IVPB 3.375 g  Status:  Discontinued     3.375 g 12.5 mL/hr over 240 Minutes Intravenous Every 8 hours 12/29/15 1505 01/08/16 1338   12/27/15 2200  vancomycin (VANCOCIN) IVPB 1000 mg/200 mL premix  Status:  Discontinued     1,000 mg 200 mL/hr over 60 Minutes  Intravenous Every 24 hours 12/27/15 0146 12/27/15 0339   12/27/15 1000  vancomycin (VANCOCIN) 50 mg/mL oral solution 125 mg  Status:  Discontinued     125 mg Oral 4 times daily 12/27/15 0053 12/27/15 0327   12/27/15 0600  piperacillin-tazobactam (ZOSYN) IVPB 3.375 g  Status:  Discontinued     3.375 g 12.5 mL/hr over 240 Minutes Intravenous Every 8 hours 12/27/15 0146 12/27/15 0339   12/27/15 0600  cefTRIAXone (ROCEPHIN) 2 g in dextrose 5 % 50 mL IVPB  Status:  Discontinued     2 g 100 mL/hr over 30 Minutes Intravenous Every 24 hours 12/27/15 0339 12/28/15 1830   12/26/15 2330  piperacillin-tazobactam (ZOSYN) IVPB 3.375 g     3.375 g 100 mL/hr over 30 Minutes Intravenous  Once 12/26/15 2318 12/27/15 0030  12/26/15 2100  vancomycin (VANCOCIN) IVPB 1000 mg/200 mL premix     1,000 mg 200 mL/hr over 60 Minutes Intravenous  Once 12/26/15 2046 12/26/15 2348       Objective   Vitals:   01/11/16 1246 01/11/16 1318 01/11/16 1336 01/11/16 1409  BP: (!) 147/84 (!) 170/101 (!) 180/116 (!) 150/92  Pulse: (!) 106 (!) 111 (!) 112 (!) 107  Resp: 12 (!) 22 18 15   Temp:      TempSrc:      SpO2: 95% 97% 98% 97%  Weight:      Height:        Intake/Output Summary (Last 24 hours) at 01/11/16 1504 Last data filed at 01/11/16 1610  Gross per 24 hour  Intake           643.67 ml  Output             1050 ml  Net          -406.33 ml   Filed Weights   12/27/15 0136  Weight: 72.4 kg (159 lb 11.2 oz)     Physical Examination:  General exam: Appears calm and comfortable, AAOx2, no distress, chronically ill appearing Respiratory system: Clear to auscultation. Respiratory effort normal. Cardiovascular system:  RRR. No  murmurs, rubs, gallops. No pedal edema. GI system: Abdomen is nondistended, soft and nontender. No organomegaly.  Central nervous system. No focal neurological deficits. Musculoskeletal- R AKA with dressing Skin: Dry gangrene noted on the medial aspect of the second and fourth toe,  minimal pus noted was from black eschar on deep palpation. Tender to palpation R AKA with dressing He continues to have 2+ pitting edema to left lower extremity  Significant scrotal edema present Psychiatry: normal affect    Data Reviewed: I have personally reviewed following labs and imaging studies  CBG:  Recent Labs Lab 01/10/16 1810 01/10/16 2121 01/11/16 0728 01/11/16 0941 01/11/16 1126  GLUCAP 84 85 79 92 99    CBC:  Recent Labs Lab 01/07/16 0732 01/08/16 1036 01/09/16 0516 01/10/16 0532 01/11/16 0456  WBC 11.9* 18.4* 15.0* 11.6* 7.7  HGB 10.2* 11.1* 9.7* 9.8* 8.8*  HCT 31.4* 34.7* 30.6* 31.0* 27.5*  MCV 90.0 91.3 91.3 91.7 91.7  PLT 294 334 270 254 236    Basic Metabolic Panel:  Recent Labs Lab 01/07/16 0732 01/08/16 1036 01/09/16 0516 01/10/16 0532 01/11/16 0456  NA 132* 131* 132* 131* 135  K 4.4 4.5 4.7 4.7 4.1  CL 107 105 106 106 107  CO2 21* 19* 22 22 24   GLUCOSE 132* 151* 135* 126* 101*  BUN 12 12 12 14 15   CREATININE 1.10 1.29* 1.34* 1.57* 1.55*  CALCIUM 8.4* 8.5* 8.3* 8.4* 8.4*    Recent Results (from the past 240 hour(s))  Surgical pcr screen     Status: Abnormal   Collection Time: 01/03/16  1:50 AM  Result Value Ref Range Status   MRSA, PCR POSITIVE (A) NEGATIVE Final    Comment: RESULT CALLED TO, READ BACK BY AND VERIFIED WITH: N WYLIE,RN @0645  01/03/16 MKELLY,MLT    Staphylococcus aureus POSITIVE (A) NEGATIVE Final    Comment:        The Xpert SA Assay (FDA approved for NASAL specimens in patients over 72 years of age), is one component of a comprehensive surveillance program.  Test performance has been validated by [facility] for patients greater than or equal to 63 year old. It is not intended to diagnose infection nor to guide or monitor  treatment.   Aerobic Culture (superficial specimen)     Status: None   Collection Time: 01/03/16 12:56 PM  Result Value Ref Range Status   Specimen Description ABSCESS LEG RIGHT   Final   Special Requests DISCHARGE RBKA  Final   Gram Stain   Final    RARE WBC PRESENT, PREDOMINANTLY PMN NO ORGANISMS SEEN    Culture NO GROWTH 2 DAYS  Final   Report Status 01/06/2016 FINAL  Final  Aerobic/Anaerobic Culture (surgical/deep wound)     Status: None (Preliminary result)   Collection Time: 01/07/16 12:17 PM  Result Value Ref Range Status   Specimen Description WOUND FLUID ON SWAB  Final   Special Requests RIGHT BELOW KNEE AMPUTATION  Final   Gram Stain   Final    MODERATE WBC PRESENT, PREDOMINANTLY PMN NO ORGANISMS SEEN    Culture   Final    NO GROWTH 2 DAYS NO ANAEROBES ISOLATED; CULTURE IN PROGRESS FOR 5 DAYS   Report Status PENDING  Incomplete     Liver Function Tests:  Recent Labs Lab 01/07/16 0732  AST 28  ALT 19  ALKPHOS 165*  BILITOT 0.8  PROT 6.9  ALBUMIN 1.5*   No results for input(s): LIPASE, AMYLASE in the last 168 hours. No results for input(s): AMMONIA in the last 168 hours.  Cardiac Enzymes: No results for input(s): CKTOTAL, CKMB, CKMBINDEX, TROPONINI in the last 168 hours. BNP (last 3 results) No results for input(s): BNP in the last 8760 hours.  ProBNP (last 3 results) No results for input(s): PROBNP in the last 8760 hours.    Studies: No results found.  Scheduled Meds: . sodium chloride   Intravenous Once  . amitriptyline  25 mg Oral QHS  . amLODipine  10 mg Oral Daily  . atorvastatin  40 mg Oral Daily  . bisacodyl  10 mg Rectal Once  . carvedilol  6.25 mg Oral BID WC  . cefTRIAXone (ROCEPHIN)  IV  2 g Intravenous Q24H  . docusate sodium  100 mg Oral BID  . feeding supplement (GLUCERNA SHAKE)  237 mL Oral TID BM  . ferrous sulfate  325 mg Oral BID WC  . furosemide  60 mg Intravenous Once  . hydrALAZINE  25 mg Oral TID  . insulin aspart  0-15 Units Subcutaneous TID WC  . insulin aspart  0-5 Units Subcutaneous QHS  . insulin glargine  17 Units Subcutaneous QHS  . levothyroxine  25 mcg Oral QAC breakfast  . multivitamin  with minerals  1 tablet Oral Daily  . pantoprazole  40 mg Oral Daily  . pregabalin  100 mg Oral Daily   And  . pregabalin  150 mg Oral BID  . vancomycin  1,250 mg Intravenous Q24H  . warfarin  7 mg Oral ONCE-1800  . Warfarin - Pharmacist Dosing Inpatient   Does not apply q1800    Continuous Infusions: . sodium chloride 1,000 mL (01/11/16 1135)    Time spent: 35 min  Jeralyn BennettZAMORA, Reve Crocket   [facility] Pager [phone] If 7PM-7AM, please contact night-coverage at [email or web address], Office  [phone]  password [email or web address] 01/11/2016, 3:04 PM  LOS: 16 days

## 2016-01-11 NOTE — Progress Notes (Signed)
Pt currently not available for OT as pt is in procedure. Will recheck on pt as schedule allows.  Breaux BridgeLori Shahidah Nesbitt, [place]OT [phone]

## 2016-01-11 NOTE — Progress Notes (Signed)
ANTICOAGULATION CONSULT NOTE - Follow Up Consult  Pharmacy Consult for coumadin Indication: atrial fibrillation  Not on File  Patient Measurements: Height: 5\' 10"  (177.8 cm) Weight: 159 lb 11.2 oz (72.4 kg) IBW/kg (Calculated) : 73  Vital Signs: Temp: 97.8 F (36.6 C) (09/29 1047) Temp Source: Oral (09/29 1047) BP: 170/95 (09/29 1047) Pulse Rate: 101 (09/29 1047)  Labs:  Recent Labs  01/09/16 0516 01/10/16 0532 01/10/16 2047 01/11/16 0456  HGB 9.7* 9.8*  --  8.8*  HCT 30.6* 31.0*  --  27.5*  PLT 270 254  --  236  LABPROT 24.9* 24.8* 20.9* 22.1*  INR 2.21 2.20 1.78 1.91  CREATININE 1.34* 1.57*  --  1.55*    Estimated Creatinine Clearance: 46.7 mL/min (by C-G formula based on SCr of 1.55 mg/dL (H)).   Medications:  Scheduled:  . sodium chloride   Intravenous Once  . sodium chloride   Intravenous Once  . amitriptyline  25 mg Oral QHS  . amLODipine  10 mg Oral Daily  . atorvastatin  40 mg Oral Daily  . carvedilol  6.25 mg Oral BID WC  . cefTRIAXone (ROCEPHIN)  IV  2 g Intravenous Q24H  . docusate sodium  100 mg Oral Daily  . feeding supplement (GLUCERNA SHAKE)  237 mL Oral TID BM  . ferrous sulfate  325 mg Oral BID WC  . hydrALAZINE  25 mg Oral TID  . insulin aspart  0-15 Units Subcutaneous TID WC  . insulin aspart  0-5 Units Subcutaneous QHS  . insulin glargine  17 Units Subcutaneous QHS  . levothyroxine  25 mcg Oral QAC breakfast  . multivitamin with minerals  1 tablet Oral Daily  . pantoprazole  40 mg Oral Daily  . pregabalin  100 mg Oral Daily   And  . pregabalin  150 mg Oral BID  . vancomycin  1,250 mg Intravenous Q24H    Assessment: 70 yo male with wound infection and s/p R AKA on 9/25. He is now s/p angiogram. He was on coumadin PTA for afib. Spoke with Dr. Myra GianottiBrabham and ok to resume coumadin today, Patient is s/p FFP on 9/29 and 9/29 and was also given vitamin K po on 9/27. -INR- 1.91. Hg= 8.8  Home coumadin dose: 7mg  po daily   Goal of Therapy:   INR 2-3 Monitor platelets by anticoagulation protocol: Yes   Plan:  -Coumadin 7mg  po today -PT/INR daily  Harland Germanndrew Lorea Kupfer, Pharm D 01/11/2016 11:26 AM

## 2016-01-11 NOTE — Progress Notes (Signed)
Subjective  -   No complaints   Physical Exam:  R AKA       Assessment/Plan:     Discussed proceeding with angio and intervention for limb salvage.  Patient agrees.  INR down to 1.9.  I will proceed and give a unit of FFP as we do the procedure.     Durene Cal 01/11/2016 8:08 AM --  Vitals:   01/11/16 0640 01/11/16 0712  BP: 120/61 120/67  Pulse: 80 78  Resp: 17   Temp: 98.8 F (37.1 C)     Intake/Output Summary (Last 24 hours) at 01/11/16 0808 Last data filed at 01/10/16 1845  Gross per 24 hour  Intake           643.67 ml  Output             1300 ml  Net          -656.33 ml     Laboratory CBC    Component Value Date/Time   WBC 7.7 01/11/2016 0456   HGB 8.8 (L) 01/11/2016 0456   HCT 27.5 (L) 01/11/2016 0456   PLT 236 01/11/2016 0456    BMET    Component Value Date/Time   NA 135 01/11/2016 0456   K 4.1 01/11/2016 0456   CL 107 01/11/2016 0456   CO2 24 01/11/2016 0456   GLUCOSE 101 (H) 01/11/2016 0456   BUN 15 01/11/2016 0456   CREATININE 1.55 (H) 01/11/2016 0456   CALCIUM 8.4 (L) 01/11/2016 0456   GFRNONAA 44 (L) 01/11/2016 0456   GFRAA 51 (L) 01/11/2016 0456    COAG Lab Results  Component Value Date   INR 1.91 01/11/2016   INR 1.78 01/10/2016   INR 2.20 01/10/2016   No results found for: PTT  Antibiotics Anti-infectives    Start     Dose/Rate Route Frequency Ordered Stop   01/10/16 0600  [MAR Hold]  vancomycin (VANCOCIN) 1,250 mg in sodium chloride 0.9 % 250 mL IVPB     (MAR Hold since 01/11/16 0747)   1,250 mg 166.7 mL/hr over 90 Minutes Intravenous Every 24 hours 01/09/16 1214     01/08/16 1500  [MAR Hold]  cefTRIAXone (ROCEPHIN) 2 g in dextrose 5 % 50 mL IVPB     (MAR Hold since 01/11/16 0747)   2 g 100 mL/hr over 30 Minutes Intravenous Every 24 hours 01/08/16 1427     01/03/16 0600  cefUROXime (ZINACEF) 1.5 g in dextrose 5 % 50 mL IVPB     1.5 g 100 mL/hr over 30 Minutes Intravenous On call to O.R. 01/02/16 1555  01/04/16 0559   01/01/16 0600  vancomycin (VANCOCIN) IVPB 750 mg/150 ml premix  Status:  Discontinued     750 mg 150 mL/hr over 60 Minutes Intravenous Every 12 hours 12/31/15 2047 01/09/16 1214   12/30/15 0600  vancomycin (VANCOCIN) IVPB 1000 mg/200 mL premix  Status:  Discontinued     1,000 mg 200 mL/hr over 60 Minutes Intravenous Every 12 hours 12/29/15 1505 12/31/15 2047   12/29/15 1800  vancomycin (VANCOCIN) IVPB 1000 mg/200 mL premix     1,000 mg 200 mL/hr over 60 Minutes Intravenous  Once 12/29/15 1505 12/29/15 1834   12/29/15 1600  piperacillin-tazobactam (ZOSYN) IVPB 3.375 g  Status:  Discontinued     3.375 g 12.5 mL/hr over 240 Minutes Intravenous Every 8 hours 12/29/15 1505 01/08/16 1338   12/27/15 2200  vancomycin (VANCOCIN) IVPB 1000 mg/200 mL premix  Status:  Discontinued  1,000 mg 200 mL/hr over 60 Minutes Intravenous Every 24 hours 12/27/15 0146 12/27/15 0339   12/27/15 1000  vancomycin (VANCOCIN) 50 mg/mL oral solution 125 mg  Status:  Discontinued     125 mg Oral 4 times daily 12/27/15 0053 12/27/15 0327   12/27/15 0600  piperacillin-tazobactam (ZOSYN) IVPB 3.375 g  Status:  Discontinued     3.375 g 12.5 mL/hr over 240 Minutes Intravenous Every 8 hours 12/27/15 0146 12/27/15 0339   12/27/15 0600  cefTRIAXone (ROCEPHIN) 2 g in dextrose 5 % 50 mL IVPB  Status:  Discontinued     2 g 100 mL/hr over 30 Minutes Intravenous Every 24 hours 12/27/15 0339 12/28/15 1830   12/26/15 2330  piperacillin-tazobactam (ZOSYN) IVPB 3.375 g     3.375 g 100 mL/hr over 30 Minutes Intravenous  Once 12/26/15 2318 12/27/15 0030   12/26/15 2100  vancomycin (VANCOCIN) IVPB 1000 mg/200 mL premix     1,000 mg 200 mL/hr over 60 Minutes Intravenous  Once 12/26/15 2046 12/26/15 2348       V. Charlena CrossWells Brabham IV, M.D. [facility] [place] Office: [phone] Pager:  [phone]

## 2016-01-11 NOTE — Progress Notes (Signed)
Physical Therapy Cancellation   01/11/16 0819  PT Visit Information  Last PT Received On 01/11/16  Reason Eval/Treat Not Completed Patient at procedure or test/unavailable  History of Present Illness 70 y.o. with a history of CAD status post CABG in 2016, history of CVA, diabetes mellitus type 2, hepatitis C, chronic anticoagulation for unknown reasons. He presented with symptoms of malaise for 4 days. Admission, he met sepsis criteria with possible source of urine. Pt had R BKA in 5/17 and now with left foot wound infection.  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  Earney Navy, PTA Pager: [phone]

## 2016-01-11 NOTE — Interval H&P Note (Signed)
History and Physical Interval Note:  01/11/2016 8:07 AM  [name] Vaillancourt  has presented today for surgery, with the diagnosis of pv  The various methods of treatment have been discussed with the patient and family. After consideration of risks, benefits and other options for treatment, the patient has consented to  Procedure(s): Lower Extremity Angiography (N/A) as a surgical intervention .  The patient's history has been reviewed, patient examined, no change in status, stable for surgery.  I have reviewed the patient's chart and labs.  Questions were answered to the patient's satisfaction.     Durene CalBrabham, Wells  Discussed proceeding with angio and intervention for limb salvage.  Patient agrees.  INR down to 1.9.  I will proceed and give a unit of FFP as we do the procedure.

## 2016-01-11 NOTE — Op Note (Addendum)
    Patient name: Ronald Simpson MRN: [id number] DOB: November 11, 1945 Sex: male  12/26/2015 - 01/11/2016 Pre-operative Diagnosis: left leg ulcer Post-operative diagnosis:  Same Surgeon:  Durene CalBrabham, Wells Procedure Performed:  1.  U/s guided access right femoral artery  2.  Abdominal aortogram  3.  Second order catheterization  4.  Left lower extremity runoff  5.  Conscious sedation(40 min)    Indications:  The patient comes in today for further evaluation of a left leg ulcer.  Procedure:  The patient was identified in the holding area and taken to room 8.  The patient was then placed supine on the table and prepped and draped in the usual sterile fashion.  A time out was called.  Conscious sedation was performed with use of IV fentanyl and Versed under continuous physician and nurse monitoring.  Heart rate and blood pressure and oxygen saturations were continuously monitored.  Ultrasound was used to evaluate the right common femoral artery.  It was patent .  A digital ultrasound image was acquired.  A micropuncture needle was used to access the right common femoral artery under ultrasound guidance.  An 018 wire was advanced without resistance and a micropuncture sheath was placed.  The 018 wire was removed and a benson wire was placed.  The micropuncture sheath was exchanged for a 5 french sheath.  An omniflush catheter was advanced over the wire to the level of L-1.  An abdominal angiogram was obtained.  Next, using the omniflush catheter and a benson wire, the aortic bifurcation was crossed and the catheter was placed into theleft external iliac artery and left runoff was obtained.    Findings:   Aortogram:  No significant renal artery stenosis.  No significant infrarenal aortic stenosis.  Bilateral common and external iliac arteries are widely patent.  Right Lower Extremity:  Not evaluated  Left Lower Extremity:  Left common femoral and profunda femoral artery are widely patent.  The left  superficial femoral artery is calcified without heme and no significant stenosis.  There is single vessel runoff via the peroneal artery which reconstitutes the posterior tibial artery at the ankle.  Intervention:  None  Impression:  #1  single vessel runoff via the peroneal artery  #2  no hemodynamically significant stenosis identified within the left superficial femoral and popliteal artery.  #3  no hemodynamically significant stenosis within the aortoiliac section.    Juleen ChinaV. Wells Brabham, M.D. [facility] [place] Office: [phone] Pager:  [phone]

## 2016-01-11 NOTE — H&P (View-Only) (Signed)
Pt's INR today is 2.2, which is too high for aortogram today.  Will give 2 units of FFP and check INR after infusion as if it is not corrected, he may need more FFP.    Will advance diet today and make npo after MN tonight.  Procedure tomorrow by Dr. Myra GianottiBrabham.  Labs in am.   Doreatha MassedSamantha Olon Russ, [facility] 01/10/2016 10:54 AM

## 2016-01-11 NOTE — Progress Notes (Signed)
Patient ID: Ronald Simpson, male   DOB: [birth year], [age] y.o.   MRN: [id number] Reviewed arteriogram. Discussed with the patient. This does show patency of the femoral artery, popliteal artery and single-vessel runoff through a large peroneal artery to the foot. No possibility for and no need for revascularization. Stable dry gangrene on the tips of his second and fourth toe. Allowed to demarcate.  Right above-knee amputation healing.  We will not follow actively. Please call for concerns. I will see him in the office in 3 weeks for staple removal

## 2016-01-11 NOTE — Progress Notes (Signed)
Pharmacy Note  Ronald Simpson is a 70 y.o. male admitted on 12/26/2015 with sepsis related to a wound infection. Pharmacy consulted for Vancomycin and Rocephin.  Day 14 abx for infectious gastroenteritis / infected left foot wound. Dry gangrene noted on left second and fourth toes. 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 Bosie HelperEarly's note.  Patient is noted s/p angiogram -WBC= 7.7, tmax= 101, SCr= 1.55 (has been trending up)   Plan: Continue vancomycin 1250mg  IV q24h Rocephin 2gm IV q24h Monitor clinical picture and renal function F/U abx deescalation / LOT planned  Height: 5\' 10"  (177.8 cm) Weight: 159 lb 11.2 oz (72.4 kg) IBW/kg (Calculated) : 73  Temp (24hrs), Avg:99.6 F (37.6 C), Min:97.7 F (36.5 C), Max:101 F (38.3 C)   Recent Labs Lab 01/07/16 0732 01/08/16 1036 01/09/16 0516 01/10/16 0532 01/11/16 0456  WBC 11.9* 18.4* 15.0* 11.6* 7.7  CREATININE 1.10 1.29* 1.34* 1.57* 1.55*  VANCOTROUGH  --   --  23*  --   --     Estimated Creatinine Clearance: 46.7 mL/min (by C-G formula based on SCr of 1.55 mg/dL (H)).    Not on File  Antimicrobials this admission: Rocephin 9/14  >> 9/15, restarted 9/26 Vancomycin 9/13 x 1, 9/16 >> Zosyn 9/13 x 1, 9/16 >> 9/26  Levels/Adjustments: 9/18 VT 20 (drawn 1 hr late) 9/21 VT 38 (drawn while vancomycin was infusing) 9/22 VT 17  Microbiology results: 9/13 BCx: ngF 9/13 UCx: 30,000 candida albicans 9/15 C-diff neg 9/15- GI panel showed enteropathic ecoli 9/16 Wound culture: MRSA & serratia marcescens (R-ancef) 9/21 Leg abscess: neg 9/21 MRSA PCR positive 9/25 R stump wound:  Thank you for allowing pharmacy to be a part of this patient's care.Harland GermanAndrew Merlin Golden, Pharm D 01/11/2016 11:23 AM

## 2016-01-12 LAB — PROTIME-INR
INR: 2.25
Prothrombin Time: 25.3 seconds — ABNORMAL HIGH (ref 11.4–15.2)

## 2016-01-12 LAB — CBC
HCT: 29.5 % — ABNORMAL LOW (ref 39.0–52.0)
HEMOGLOBIN: 9.1 g/dL — AB (ref 13.0–17.0)
MCH: 28.5 pg (ref 26.0–34.0)
MCHC: 30.8 g/dL (ref 30.0–36.0)
MCV: 92.5 fL (ref 78.0–100.0)
PLATELETS: 242 10*3/uL (ref 150–400)
RBC: 3.19 MIL/uL — AB (ref 4.22–5.81)
RDW: 13.6 % (ref 11.5–15.5)
WBC: 7.5 10*3/uL (ref 4.0–10.5)

## 2016-01-12 LAB — PREPARE FRESH FROZEN PLASMA: UNIT DIVISION: 0

## 2016-01-12 LAB — AEROBIC/ANAEROBIC CULTURE W GRAM STAIN (SURGICAL/DEEP WOUND): Culture: NO GROWTH

## 2016-01-12 LAB — BASIC METABOLIC PANEL
ANION GAP: 7 (ref 5–15)
BUN: 13 mg/dL (ref 6–20)
CALCIUM: 8.3 mg/dL — AB (ref 8.9–10.3)
CHLORIDE: 104 mmol/L (ref 101–111)
CO2: 24 mmol/L (ref 22–32)
CREATININE: 1.53 mg/dL — AB (ref 0.61–1.24)
GFR calc non Af Amer: 45 mL/min — ABNORMAL LOW (ref >60)
GFR, EST AFRICAN AMERICAN: 52 mL/min — AB (ref >60)
Glucose, Bld: 148 mg/dL — ABNORMAL HIGH (ref 65–99)
Potassium: 4 mmol/L (ref 3.5–5.1)
SODIUM: 135 mmol/L (ref 135–145)

## 2016-01-12 LAB — AEROBIC/ANAEROBIC CULTURE (SURGICAL/DEEP WOUND)

## 2016-01-12 LAB — GLUCOSE, CAPILLARY
GLUCOSE-CAPILLARY: 124 mg/dL — AB (ref 65–99)
GLUCOSE-CAPILLARY: 109 mg/dL — AB (ref 65–99)
Glucose-Capillary: 128 mg/dL — ABNORMAL HIGH (ref 65–99)
Glucose-Capillary: 109 mg/dL — ABNORMAL HIGH (ref 65–99)

## 2016-01-12 MED ORDER — WARFARIN SODIUM 5 MG PO TABS
5.0000 mg | ORAL_TABLET | Freq: Once | ORAL | Status: AC
  Administered 2016-01-12: 5 mg via ORAL
  Filled 2016-01-12: qty 1

## 2016-01-12 MED ORDER — FUROSEMIDE 10 MG/ML IJ SOLN
60.0000 mg | Freq: Once | INTRAMUSCULAR | Status: AC
  Administered 2016-01-12: 60 mg via INTRAVENOUS
  Filled 2016-01-12: qty 6

## 2016-01-12 NOTE — Progress Notes (Signed)
Patient's daughter, [name] called to check on patient.  Per patient ok for RN to speak with [name] and give update.

## 2016-01-12 NOTE — Plan of Care (Signed)
Problem: Education: Goal: Knowledge of [facility] Education information/materials will improve Outcome: Progressing Patient aware of plan of care.  Patient has experienced pain this shift.  Patient able to rate pain using numeric pain scale with RN instruction (see MAR and flowsheets for detailed pain information).  RN provided medication education on all medication administered thus far this shift.  Patient stated understanding.

## 2016-01-12 NOTE — Plan of Care (Signed)
Problem: Activity: Goal: Risk for activity intolerance will decrease Outcome: Progressing Educated on incentive spirometry. Encouraged to comple 10x/day/ Pt reaching spirometry goal of 1020m  Problem: Activity: Goal: Will remain free from falls Outcome: Completed/Met Date Met: 01/12/16 Pt free from falls this shift

## 2016-01-12 NOTE — Progress Notes (Signed)
ANTICOAGULATION CONSULT NOTE - Follow Up Consult  Pharmacy Consult for coumadin Indication: atrial fibrillation  Patient Measurements: Height: 5\' 10"  (177.8 cm) Weight: 159 lb 11.2 oz (72.4 kg) IBW/kg (Calculated) : 73  Vital Signs: Temp: 98.2 F (36.8 C) (09/30 0502) BP: 132/72 (09/30 0502) Pulse Rate: 86 (09/30 0502)  Labs:  Recent Labs  01/10/16 0532 01/10/16 2047 01/11/16 0456 01/12/16 0305  HGB 9.8*  --  8.8* 9.1*  HCT 31.0*  --  27.5* 29.5*  PLT 254  --  236 242  LABPROT 24.8* 20.9* 22.1* 25.3*  INR 2.20 1.78 1.91 2.25  CREATININE 1.57*  --  1.55* 1.53*    Estimated Creatinine Clearance: 47.3 mL/min (by C-G formula based on SCr of 1.53 mg/dL (H)).   Medications:  Scheduled:  . sodium chloride   Intravenous Once  . amitriptyline  25 mg Oral QHS  . amLODipine  10 mg Oral Daily  . atorvastatin  40 mg Oral Daily  . carvedilol  6.25 mg Oral BID WC  . cefTRIAXone (ROCEPHIN)  IV  2 g Intravenous Q24H  . docusate sodium  100 mg Oral BID  . feeding supplement (GLUCERNA SHAKE)  237 mL Oral TID BM  . ferrous sulfate  325 mg Oral BID WC  . hydrALAZINE  25 mg Oral TID  . insulin aspart  0-15 Units Subcutaneous TID WC  . insulin aspart  0-5 Units Subcutaneous QHS  . insulin glargine  17 Units Subcutaneous QHS  . levothyroxine  25 mcg Oral QAC breakfast  . multivitamin with minerals  1 tablet Oral Daily  . pantoprazole  40 mg Oral Daily  . pregabalin  100 mg Oral Daily   And  . pregabalin  150 mg Oral BID  . vancomycin  1,250 mg Intravenous Q24H  . 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 Coumadin PTA for afib - held initially for elevated INR. Now s/p angiogram and coumadin resumed. Patient is also s/p FFP on 9/28 and 9/29 as well as PO vitamin K x1 on 9/27. No overt signs or symptoms of bleeding noted.  -INR: 1.91 to 2.25 after 7mg  x1. Hgb low, but stable at 9.1.   Home  coumadin dose: 7mg  po daily  Goal of Therapy:  INR 2-3 Monitor platelets by anticoagulation protocol: Yes   Plan:  -Coumadin 5 mg po x1 today -PT/INR daily -Monitor for signs and symptoms of bleeding   York CeriseKatherine Cook, PharmD Pharmacy Resident  Pager [phone] 01/12/16 7:35 AM

## 2016-01-12 NOTE — Progress Notes (Signed)
[facility]  PROGRESS NOTE  Ronald PunaFletcher Simpson JXB:[id number] DOB: 02/19/1946 DOA: 12/26/2015 PCP: Tilden DomeSCHNEIDER,RICHARD, MD   Brief HPI:  70 y.o. with a history of CAD status post CABG in 2016, PVD s/p R BLA, history of CVA, diabetes mellitus type 2, hepatitis C, Pafib on warfarin. He presented with symptoms of malaise for 4 days. He was found to have sepsis, from the left foot.  Started on broad-spectrum antibiotics, ABI was nondiagnostic, GI pathogen panel was collected on admission due to initial diarrhea which came back positive for EPEC, diarrhea improved and resolved now. Left foot showed gangrenous changes in the second and fourth toe and subsequently she CT scan showed subcutaneous edema in left foot. [place], [place] with [place]. Vascular surgery was consulted were, it was felt that he may need revision of his R BKA due to wound with purulence near stump and arteriogram to evaluate his left leg -gangrenous toes. Pt has gone back and forth with his discussions with vascular surgery and finally agreed to undergo R AKA, this was completed 9/25,  awaiting VVS plans regarding Arteriogram of LLE   Subjective   He continues to express concern for scrotal edema    Assessment/Plan:   1. Sepsis- from wound with purulence at R BKA stump and gangrenous changes in 2 toes of L foot       -CT with subcutaneous edema around ankle tracking in the dorsum of the foot and along the ball of the foot.       -ABI inconclusive due to non compressible vessels       -previous BKA in [facility] in May       -Wound Cx from L foot wound: polymicrobial including MRSA and Seratia marcescens       -on IV Vancomycin and Zosyn-Day 13, change to Vancomycin and Ceftraixone on 01/09/2016, d/w Dr.Early today he recommends few more days of Abx for his R leg       -[facility]7 in Care [facility]: notes  hemodynamically significant stenosis in L superficial fem artery/anterior tibial and proximal R SFA      -Appreciate VVS consult, s/p R AKA on 9/25 and will need arteriogram planned for LLE per VVS      -Had been scheduled for Arteriogram scheduled for Thurs 01/10/2016, however, INR=2.21, despite giving 5 mg PO of Vit K on 01/09/2016. VVS giving FFP as INR is too high for procedure to be done.        -On 01/11/2016 he underwent Arteriogram that did not reveal significant stenosis within the left superficial femoral and popliteal artery nor stenosis within the aortoiliac section. Vascular surgery reporting stable dry gangrene on tips of second and fourth toe, recommended outpatient follow-up in the office.  1. Enteropathogenic Ecoli Diarrhea     -diarrhea resolved, GI pathogen panel showed enteropathogenic Escherichia coli.      -supportive care  3.   History of CAD- stable, continue Coreg  2. Hypertension- -stable  3. Hypothyroidism-continue Synthroid  4. History of hepatitis C - Cirrhotic appearance of liver on CT and albumin is 1.6 -Complaining of worsening edema  -He is getting diuresed with IV lasix  5. Diabetes mellitus- - continue Lantus and sliding scale insulin with NovoLog. Blood glucose moderately controlled.  7.   Paroxysmal Afib - On anticoagulation with warfarin --pt and family unaware why he was on warfarin, I  was able to contact his PCP (Dr.Schneider) who was able to review his Cardiologists note from EMR from 2015 which notes P.Afib as reason for anticoagulation -warfarin on held for BKA and now for arteriogram, resume warfarin post arteriogram  8.  Volume overload/Cirrhosis -Physical exam appears volume overloaded with significant scrotal edema -Previous CT scan of abdomen performed on 12/27/2015 showing findings consistent with cirrhosis. Labs showing albumen level I.5 on 01/07/2016 -On 01/11/2016 continues to have evidence of volume overload with significant scrotal  edema, will give 60 mg of IV lasix x 1. His Ins and outs reviewed he has a positive net fluid balance of 10.7 liters.   -On 01/12/2016 continues to have evidence of volume overload. Ins and outs were reviewed has a net positive fluid balance of 7.5 L, with urine output of 1.7 L in the last 24 hours. Will administer 60 mg of IV Lasix. A.m. lab work reviewed, his creatinine remained stable at 1.5.  DVT prophylaxis: Anticoagulated with warfarin  Code Status: Full code Family Communication: No family at bedside, called and d/w mother 9/25 Disposition Plan: Diuresis with IV Lasix, discharged to SNF in the next 24-48 hours   Consultants:  VVS   Procedures:  R AKA on 9/25 Dr.Early  Angiogram  Impression: -single vessel runoff via the peroneal artery -no hemodynamically significant stenosis identified within the left superficial femoral and popliteal artery. -no hemodynamically significant stenosis within the aortoiliac section.  Antibiotics:   Anti-infectives    Start     Dose/Rate Route Frequency Ordered Stop   01/10/16 0600  vancomycin (VANCOCIN) 1,250 mg in sodium chloride 0.9 % 250 mL IVPB     1,250 mg 166.7 mL/hr over 90 Minutes Intravenous Every 24 hours 01/09/16 1214     01/08/16 1500  cefTRIAXone (ROCEPHIN) 2 g in dextrose 5 % 50 mL IVPB     2 g 100 mL/hr over 30 Minutes Intravenous Every 24 hours 01/08/16 1427     01/03/16 0600  cefUROXime (ZINACEF) 1.5 g in dextrose 5 % 50 mL IVPB     1.5 g 100 mL/hr over 30 Minutes Intravenous On call to O.R. 01/02/16 1555 01/04/16 0559   01/01/16 0600  vancomycin (VANCOCIN) IVPB 750 mg/150 ml premix  Status:  Discontinued     750 mg 150 mL/hr over 60 Minutes Intravenous Every 12 hours 12/31/15 2047 01/09/16 1214   12/30/15 0600  vancomycin (VANCOCIN) IVPB 1000 mg/200 mL premix  Status:  Discontinued     1,000 mg 200 mL/hr over 60 Minutes Intravenous Every 12 hours 12/29/15 1505 12/31/15 2047   12/29/15 1800  vancomycin (VANCOCIN) IVPB  1000 mg/200 mL premix     1,000 mg 200 mL/hr over 60 Minutes Intravenous  Once 12/29/15 1505 12/29/15 1834   12/29/15 1600  piperacillin-tazobactam (ZOSYN) IVPB 3.375 g  Status:  Discontinued     3.375 g 12.5 mL/hr over 240 Minutes Intravenous Every 8 hours 12/29/15 1505 01/08/16 1338   12/27/15 2200  vancomycin (VANCOCIN) IVPB 1000 mg/200 mL premix  Status:  Discontinued     1,000 mg 200 mL/hr over 60 Minutes Intravenous Every 24 hours 12/27/15 0146 12/27/15 0339   12/27/15 1000  vancomycin (VANCOCIN) 50 mg/mL oral solution 125 mg  Status:  Discontinued     125 mg Oral 4 times daily 12/27/15 0053 12/27/15 0327   12/27/15 0600  piperacillin-tazobactam (ZOSYN) IVPB 3.375 g  Status:  Discontinued     3.375 g 12.5 mL/hr over 240 Minutes Intravenous Every 8 hours  12/27/15 0146 12/27/15 0339   12/27/15 0600  cefTRIAXone (ROCEPHIN) 2 g in dextrose 5 % 50 mL IVPB  Status:  Discontinued     2 g 100 mL/hr over 30 Minutes Intravenous Every 24 hours 12/27/15 0339 12/28/15 1830   12/26/15 2330  piperacillin-tazobactam (ZOSYN) IVPB 3.375 g     3.375 g 100 mL/hr over 30 Minutes Intravenous  Once 12/26/15 2318 12/27/15 0030   12/26/15 2100  vancomycin (VANCOCIN) IVPB 1000 mg/200 mL premix     1,000 mg 200 mL/hr over 60 Minutes Intravenous  Once 12/26/15 2046 12/26/15 2348       Objective   Vitals:   01/11/16 1800 01/11/16 1959 01/11/16 2100 01/12/16 0502  BP:  (!) 151/76 136/73 132/72  Pulse: (!) 103 (!) 102 99 86  Resp: 18 17 16 14   Temp:  100.1 F (37.8 C)  98.2 F (36.8 C)  TempSrc:      SpO2: 100% 97% 95% 98%  Weight:      Height:        Intake/Output Summary (Last 24 hours) at 01/12/16 0852 Last data filed at 01/12/16 0615  Gross per 24 hour  Intake           1002.5 ml  Output             1250 ml  Net           -247.5 ml   Filed Weights   12/27/15 0136  Weight: 72.4 kg (159 lb 11.2 oz)     Physical Examination:  General exam: Appears calm and comfortable, AAOx2, no  distress, chronically ill appearing Respiratory system: Clear to auscultation. Respiratory effort normal. Cardiovascular system:  RRR. No  murmurs, rubs, gallops. No pedal edema. GI system: Abdomen is nondistended, soft and nontender. No organomegaly.  Central nervous system. No focal neurological deficits. Musculoskeletal- R AKA with dressing Skin: Dry gangrene noted on the medial aspect of the second and fourth toe, minimal pus noted was from black eschar on deep palpation. Tender to palpation R AKA with dressing He continues to have 2+ pitting edema to left lower extremity  Significant scrotal edema present Psychiatry: normal affect    Data Reviewed: I have personally reviewed following labs and imaging studies  CBG:  Recent Labs Lab 01/11/16 0941 01/11/16 1126 01/11/16 1624 01/11/16 2151 01/12/16 0734  GLUCAP 92 99 220* 132* 128*    CBC:  Recent Labs Lab 01/08/16 1036 01/09/16 0516 01/10/16 0532 01/11/16 0456 01/12/16 0305  WBC 18.4* 15.0* 11.6* 7.7 7.5  HGB 11.1* 9.7* 9.8* 8.8* 9.1*  HCT 34.7* 30.6* 31.0* 27.5* 29.5*  MCV 91.3 91.3 91.7 91.7 92.5  PLT 334 270 254 236 242    Basic Metabolic Panel:  Recent Labs Lab 01/08/16 1036 01/09/16 0516 01/10/16 0532 01/11/16 0456 01/12/16 0305  NA 131* 132* 131* 135 135  K 4.5 4.7 4.7 4.1 4.0  CL 105 106 106 107 104  CO2 19* 22 22 24 24   GLUCOSE 151* 135* 126* 101* 148*  BUN 12 12 14 15 13   CREATININE 1.29* 1.34* 1.57* 1.55* 1.53*  CALCIUM 8.5* 8.3* 8.4* 8.4* 8.3*    Recent Results (from the past 240 hour(s))  Surgical pcr screen     Status: Abnormal   Collection Time: 01/03/16  1:50 AM  Result Value Ref Range Status   MRSA, PCR POSITIVE (A) NEGATIVE Final    Comment: RESULT CALLED TO, READ BACK BY AND VERIFIED WITH: N WYLIE,RN @0645  01/03/16 MKELLY,MLT  Staphylococcus aureus POSITIVE (A) NEGATIVE Final    Comment:        The Xpert SA Assay (FDA approved for NASAL specimens in patients over 64  years of age), is one component of a comprehensive surveillance program.  Test performance has been validated by [facility] for patients greater than or equal to 51 year old. It is not intended to diagnose infection nor to guide or monitor treatment.   Aerobic Culture (superficial specimen)     Status: None   Collection Time: 01/03/16 12:56 PM  Result Value Ref Range Status   Specimen Description ABSCESS LEG RIGHT  Final   Special Requests DISCHARGE RBKA  Final   Gram Stain   Final    RARE WBC PRESENT, PREDOMINANTLY PMN NO ORGANISMS SEEN    Culture NO GROWTH 2 DAYS  Final   Report Status 01/06/2016 FINAL  Final  Aerobic/Anaerobic Culture (surgical/deep wound)     Status: None (Preliminary result)   Collection Time: 01/07/16 12:17 PM  Result Value Ref Range Status   Specimen Description WOUND FLUID ON SWAB  Final   Special Requests RIGHT BELOW KNEE AMPUTATION  Final   Gram Stain   Final    MODERATE WBC PRESENT, PREDOMINANTLY PMN NO ORGANISMS SEEN    Culture   Final    NO GROWTH 2 DAYS NO ANAEROBES ISOLATED; CULTURE IN PROGRESS FOR 5 DAYS   Report Status PENDING  Incomplete     Liver Function Tests:  Recent Labs Lab 01/07/16 0732  AST 28  ALT 19  ALKPHOS 165*  BILITOT 0.8  PROT 6.9  ALBUMIN 1.5*   No results for input(s): LIPASE, AMYLASE in the last 168 hours. No results for input(s): AMMONIA in the last 168 hours.  Cardiac Enzymes: No results for input(s): CKTOTAL, CKMB, CKMBINDEX, TROPONINI in the last 168 hours. BNP (last 3 results) No results for input(s): BNP in the last 8760 hours.  ProBNP (last 3 results) No results for input(s): PROBNP in the last 8760 hours.    Studies: No results found.  Scheduled Meds: . sodium chloride   Intravenous Once  . amitriptyline  25 mg Oral QHS  . amLODipine  10 mg Oral Daily  . atorvastatin  40 mg Oral Daily  . carvedilol  6.25 mg Oral BID WC  . cefTRIAXone (ROCEPHIN)  IV  2 g Intravenous Q24H  . docusate  sodium  100 mg Oral BID  . feeding supplement (GLUCERNA SHAKE)  237 mL Oral TID BM  . ferrous sulfate  325 mg Oral BID WC  . furosemide  60 mg Intravenous Once  . hydrALAZINE  25 mg Oral TID  . insulin aspart  0-15 Units Subcutaneous TID WC  . insulin aspart  0-5 Units Subcutaneous QHS  . insulin glargine  17 Units Subcutaneous QHS  . levothyroxine  25 mcg Oral QAC breakfast  . multivitamin with minerals  1 tablet Oral Daily  . pantoprazole  40 mg Oral Daily  . pregabalin  100 mg Oral Daily   And  . pregabalin  150 mg Oral BID  . vancomycin  1,250 mg Intravenous Q24H  . warfarin  5 mg Oral ONCE-1800  . Warfarin - Pharmacist Dosing Inpatient   Does not apply q1800    Continuous Infusions:    Time spent: 35 min  Jeralyn Bennett   [facility] Pager [phone] If 7PM-7AM, please contact night-coverage at [email or web address], Office  [phone]  password [email or web address] 01/12/2016, 8:52 AM  LOS: 17 days

## 2016-01-13 LAB — GLUCOSE, CAPILLARY
GLUCOSE-CAPILLARY: 104 mg/dL — AB (ref 65–99)
GLUCOSE-CAPILLARY: 153 mg/dL — AB (ref 65–99)
Glucose-Capillary: 160 mg/dL — ABNORMAL HIGH (ref 65–99)
Glucose-Capillary: 172 mg/dL — ABNORMAL HIGH (ref 65–99)

## 2016-01-13 LAB — CBC
HEMATOCRIT: 29.3 % — AB (ref 39.0–52.0)
HEMOGLOBIN: 9 g/dL — AB (ref 13.0–17.0)
MCH: 28.3 pg (ref 26.0–34.0)
MCHC: 30.7 g/dL (ref 30.0–36.0)
MCV: 92.1 fL (ref 78.0–100.0)
Platelets: 248 10*3/uL (ref 150–400)
RBC: 3.18 MIL/uL — ABNORMAL LOW (ref 4.22–5.81)
RDW: 13.6 % (ref 11.5–15.5)
WBC: 7.3 10*3/uL (ref 4.0–10.5)

## 2016-01-13 LAB — BASIC METABOLIC PANEL
Anion gap: 6 (ref 5–15)
BUN: 14 mg/dL (ref 6–20)
CALCIUM: 8.2 mg/dL — AB (ref 8.9–10.3)
CO2: 23 mmol/L (ref 22–32)
CREATININE: 1.45 mg/dL — AB (ref 0.61–1.24)
Chloride: 108 mmol/L (ref 101–111)
GFR, EST AFRICAN AMERICAN: 56 mL/min — AB (ref >60)
GFR, EST NON AFRICAN AMERICAN: 48 mL/min — AB (ref >60)
Glucose, Bld: 113 mg/dL — ABNORMAL HIGH (ref 65–99)
Potassium: 4.1 mmol/L (ref 3.5–5.1)
Sodium: 137 mmol/L (ref 135–145)

## 2016-01-13 LAB — PROTIME-INR
INR: 2.14
PROTHROMBIN TIME: 24.2 s — AB (ref 11.4–15.2)

## 2016-01-13 MED ORDER — BISACODYL 10 MG RE SUPP
10.0000 mg | Freq: Every day | RECTAL | Status: DC | PRN
  Administered 2016-01-13: 10 mg via RECTAL
  Filled 2016-01-13: qty 1

## 2016-01-13 MED ORDER — NALOXEGOL OXALATE 12.5 MG PO TABS
12.5000 mg | ORAL_TABLET | Freq: Every day | ORAL | Status: DC
  Administered 2016-01-14: 12.5 mg via ORAL
  Filled 2016-01-13: qty 1

## 2016-01-13 MED ORDER — WARFARIN SODIUM 5 MG PO TABS
7.0000 mg | ORAL_TABLET | Freq: Once | ORAL | Status: AC
  Administered 2016-01-13: 7 mg via ORAL
  Filled 2016-01-13: qty 1

## 2016-01-13 MED ORDER — NALOXEGOL OXALATE 25 MG PO TABS
25.0000 mg | ORAL_TABLET | Freq: Every day | ORAL | Status: DC
  Administered 2016-01-13: 25 mg via ORAL
  Filled 2016-01-13: qty 1

## 2016-01-13 MED ORDER — FUROSEMIDE 10 MG/ML IJ SOLN
60.0000 mg | Freq: Once | INTRAMUSCULAR | Status: AC
  Administered 2016-01-13: 60 mg via INTRAVENOUS
  Filled 2016-01-13: qty 6

## 2016-01-13 MED ORDER — DOXYCYCLINE HYCLATE 100 MG PO TABS
100.0000 mg | ORAL_TABLET | Freq: Two times a day (BID) | ORAL | Status: DC
  Administered 2016-01-13 – 2016-01-14 (×3): 100 mg via ORAL
  Filled 2016-01-13 (×3): qty 1

## 2016-01-13 NOTE — Progress Notes (Signed)
[facility]  PROGRESS NOTE  [name] Bubolz OZH:[id number] DOB: [birth year] DOA: 12/26/2015 PCP: Tilden DomeSCHNEIDER,RICHARD, MD   Brief HPI:  70 y.o. with a history of CAD status post CABG in 2016, PVD s/p R BLA, history of CVA, diabetes mellitus type 2, hepatitis C, Pafib on warfarin. He presented with symptoms of malaise for 4 days. He was found to have sepsis, from the left foot.  Started on broad-spectrum antibiotics, ABI was nondiagnostic, GI pathogen panel was collected on admission due to initial diarrhea which came back positive for EPEC, diarrhea improved and resolved now. Left foot showed gangrenous changes in the second and fourth toe and subsequently she CT scan showed subcutaneous edema in left foot. [place] [place], [place] with [place]. Vascular surgery was consulted were, it was felt that he may need revision of his R BKA due to wound with purulence near stump and arteriogram to evaluate his left leg -gangrenous toes. Pt has gone back and forth with his discussions with vascular surgery and finally agreed to undergo R AKA, this was completed 9/25,  awaiting VVS plans regarding Arteriogram of LLE   Subjective   He thinks he is getting better, swelling coming down    Assessment/Plan:   1. Sepsis- from wound with purulence at R BKA stump and gangrenous changes in 2 toes of L foot       -CT with subcutaneous edema around ankle tracking in the dorsum of the foot and along the ball of the foot.       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 VVS giving FFP as INR is too high for procedure to be done.        -On 01/11/2016 he underwent Arteriogram that did not reveal significant stenosis within the left superficial femoral and popliteal artery nor stenosis within the aortoiliac section. Vascular surgery reporting stable dry gangrene on tips of second and fourth toe, recommended outpatient follow-up in the office.WIll stop IV Vancomycin and Ceftriaxone, transition him to Doxy 100 mg PO BID.   1. Enteropathogenic Ecoli Diarrhea     -diarrhea resolved, GI pathogen panel showed enteropathogenic Escherichia coli.      -supportive care  3.   History of CAD- stable, continue Coreg  2. Hypertension- -stable  3. Hypothyroidism-continue Synthroid  4. History of hepatitis C - Cirrhotic appearance of liver on CT and albumin is 1.6 -Complaining of worsening edema  -He is getting diuresed with IV lasix  5. Diabetes mellitus- - continue Lantus and sliding scale insulin with NovoLog. Blood glucose moderately controlled.  7.   Paroxysmal Afib -  On anticoagulation with warfarin --pt and family unaware why he was on warfarin, I was able to contact his PCP (Dr.Schneider) who was able to review his Cardiologists note from EMR from 2015 which notes P.Afib as reason for anticoagulation  8.  Volume overload/Cirrhosis -Physical exam appears volume overloaded with significant scrotal edema -Previous CT scan of abdomen performed on 12/27/2015 showing findings consistent with cirrhosis. Labs showing albumen level I.5 on 01/07/2016 -On 01/11/2016 continues to have evidence of volume overload with significant scrotal edema,  will give 60 mg of IV lasix x 1. His Ins and outs reviewed he has a positive net fluid balance of 10.7 liters.   -On 01/12/2016 continues to have evidence of volume overload. Ins and outs were reviewed has a net positive fluid balance of 7.5 L, with urine output of 1.7 L in the last 24 hours. Will administer 60 mg of IV Lasix. A.m. lab work reviewed, his creatinine remained stable at 1.5. -On 01/13/2016 ins and out showing + fluid balance of 5.9 L, having urinary output of 1.7 L in the last 24 hours. Continues to have evidence of fluid overload on exam with significant scrotal edema. Continue IV lasix.   DVT prophylaxis: Anticoagulated with warfarin  Code Status: Full code Family Communication: No family at bedside, called and d/w mother 9/25 Disposition Plan: Diuresis with IV Lasix, discharged to SNF in the next 24 hours   Consultants:  VVS   Procedures:  R AKA on 9/25 Dr.Early  Angiogram  Impression: -single vessel runoff via the peroneal artery -no hemodynamically significant stenosis identified within the left superficial femoral and popliteal artery. -no hemodynamically significant stenosis within the aortoiliac section.  Antibiotics:   Anti-infectives    Start     Dose/Rate Route Frequency Ordered Stop   01/10/16 0600  vancomycin (VANCOCIN) 1,250 mg in sodium chloride 0.9 % 250 mL IVPB     1,250 mg 166.7 mL/hr over 90 Minutes Intravenous Every 24 hours 01/09/16 1214     01/08/16 1500  cefTRIAXone (ROCEPHIN) 2 g in dextrose 5 % 50 mL IVPB     2 g 100 mL/hr over 30 Minutes Intravenous Every 24 hours 01/08/16 1427     01/03/16 0600  cefUROXime (ZINACEF) 1.5 g in dextrose 5 % 50 mL IVPB     1.5 g 100 mL/hr over 30 Minutes Intravenous On call to O.R. 01/02/16 1555 01/04/16 0559   01/01/16 0600  vancomycin (VANCOCIN) IVPB 750 mg/150 ml premix  Status:  Discontinued     750 mg 150 mL/hr over 60 Minutes Intravenous Every 12 hours 12/31/15 2047 01/09/16 1214   12/30/15 0600   vancomycin (VANCOCIN) IVPB 1000 mg/200 mL premix  Status:  Discontinued     1,000 mg 200 mL/hr over 60 Minutes Intravenous Every 12 hours 12/29/15 1505 12/31/15 2047   12/29/15 1800  vancomycin (VANCOCIN) IVPB 1000 mg/200 mL premix     1,000 mg 200 mL/hr over 60 Minutes Intravenous  Once 12/29/15 1505 12/29/15 1834   12/29/15 1600  piperacillin-tazobactam (ZOSYN) IVPB 3.375 g  Status:  Discontinued     3.375 g 12.5 mL/hr over 240 Minutes Intravenous Every 8 hours 12/29/15 1505 01/08/16 1338   12/27/15 2200  vancomycin (VANCOCIN) IVPB 1000 mg/200 mL premix  Status:  Discontinued     1,000 mg 200 mL/hr over 60 Minutes Intravenous Every 24 hours 12/27/15 0146 12/27/15 0339   12/27/15 1000  vancomycin (VANCOCIN) 50 mg/mL oral solution 125 mg  Status:  Discontinued  125 mg Oral 4 times daily 12/27/15 0053 12/27/15 0327   12/27/15 0600  piperacillin-tazobactam (ZOSYN) IVPB 3.375 g  Status:  Discontinued     3.375 g 12.5 mL/hr over 240 Minutes Intravenous Every 8 hours 12/27/15 0146 12/27/15 0339   12/27/15 0600  cefTRIAXone (ROCEPHIN) 2 g in dextrose 5 % 50 mL IVPB  Status:  Discontinued     2 g 100 mL/hr over 30 Minutes Intravenous Every 24 hours 12/27/15 0339 12/28/15 1830   12/26/15 2330  piperacillin-tazobactam (ZOSYN) IVPB 3.375 g     3.375 g 100 mL/hr over 30 Minutes Intravenous  Once 12/26/15 2318 12/27/15 0030   12/26/15 2100  vancomycin (VANCOCIN) IVPB 1000 mg/200 mL premix     1,000 mg 200 mL/hr over 60 Minutes Intravenous  Once 12/26/15 2046 12/26/15 2348       Objective   Vitals:   01/12/16 2228 01/13/16 0458 01/13/16 0800 01/13/16 0802  BP:  (!) 145/80 134/82 134/82  Pulse: 98 90 (!) 106   Resp: 18 (!) 9 (!) 21   Temp: 98.5 F (36.9 C) 98.4 F (36.9 C)    TempSrc:      SpO2: 99% 95% 91%   Weight:      Height:        Intake/Output Summary (Last 24 hours) at 01/13/16 1237 Last data filed at 01/13/16 1000  Gross per 24 hour  Intake             1047 ml  Output              1575 ml  Net             -528 ml   Filed Weights   12/27/15 0136  Weight: 72.4 kg (159 lb 11.2 oz)     Physical Examination:  General exam: Appears calm and comfortable, AAOx2, no distress, chronically ill appearing Respiratory system: Clear to auscultation. Respiratory effort normal. Cardiovascular system:  RRR. No  murmurs, rubs, gallops. No pedal edema. GI system: Abdomen is nondistended, soft and nontender. No organomegaly.  Central nervous system. No focal neurological deficits. Musculoskeletal- R AKA with dressing Skin: Dry gangrene noted on the medial aspect of the second and fourth toe, minimal pus noted was from black eschar on deep palpation. Tender to palpation R AKA with dressing He continues to have 2+ pitting edema to left lower extremity  Significant scrotal edema present Psychiatry: normal affect    Data Reviewed: I have personally reviewed following labs and imaging studies  CBG:  Recent Labs Lab 01/12/16 1107 01/12/16 1558 01/12/16 2227 01/13/16 0727 01/13/16 1102  GLUCAP 124* 109* 109* 104* 160*    CBC:  Recent Labs Lab 01/09/16 0516 01/10/16 0532 01/11/16 0456 01/12/16 0305 01/13/16 0538  WBC 15.0* 11.6* 7.7 7.5 7.3  HGB 9.7* 9.8* 8.8* 9.1* 9.0*  HCT 30.6* 31.0* 27.5* 29.5* 29.3*  MCV 91.3 91.7 91.7 92.5 92.1  PLT 270 254 236 242 248    Basic Metabolic Panel:  Recent Labs Lab 01/08/16 1036 01/09/16 0516 01/10/16 0532 01/11/16 0456 01/12/16 0305  NA 131* 132* 131* 135 135  K 4.5 4.7 4.7 4.1 4.0  CL 105 106 106 107 104  CO2 19* 22 22 24 24   GLUCOSE 151* 135* 126* 101* 148*  BUN 12 12 14 15 13   CREATININE 1.29* 1.34* 1.57* 1.55* 1.53*  CALCIUM 8.5* 8.3* 8.4* 8.4* 8.3*    Recent Results (from the past 240 hour(s))  Aerobic Culture (superficial specimen)  Status: None   Collection Time: 01/03/16 12:56 PM  Result Value Ref Range Status   Specimen Description ABSCESS LEG RIGHT  Final   Special Requests  DISCHARGE RBKA  Final   Gram Stain   Final    RARE WBC PRESENT, PREDOMINANTLY PMN NO ORGANISMS SEEN    Culture NO GROWTH 2 DAYS  Final   Report Status 01/06/2016 FINAL  Final  Aerobic/Anaerobic Culture (surgical/deep wound)     Status: None   Collection Time: 01/07/16 12:17 PM  Result Value Ref Range Status   Specimen Description WOUND FLUID ON SWAB  Final   Special Requests RIGHT BELOW KNEE AMPUTATION  Final   Gram Stain   Final    MODERATE WBC PRESENT, PREDOMINANTLY PMN NO ORGANISMS SEEN    Culture No growth aerobically or anaerobically.  Final   Report Status 01/12/2016 FINAL  Final     Liver Function Tests:  Recent Labs Lab 01/07/16 0732  AST 28  ALT 19  ALKPHOS 165*  BILITOT 0.8  PROT 6.9  ALBUMIN 1.5*   No results for input(s): LIPASE, AMYLASE in the last 168 hours. No results for input(s): AMMONIA in the last 168 hours.  Cardiac Enzymes: No results for input(s): CKTOTAL, CKMB, CKMBINDEX, TROPONINI in the last 168 hours. BNP (last 3 results) No results for input(s): BNP in the last 8760 hours.  ProBNP (last 3 results) No results for input(s): PROBNP in the last 8760 hours.    Studies: No results found.  Scheduled Meds: . sodium chloride   Intravenous Once  . amitriptyline  25 mg Oral QHS  . amLODipine  10 mg Oral Daily  . atorvastatin  40 mg Oral Daily  . carvedilol  6.25 mg Oral BID WC  . cefTRIAXone (ROCEPHIN)  IV  2 g Intravenous Q24H  . docusate sodium  100 mg Oral BID  . feeding supplement (GLUCERNA SHAKE)  237 mL Oral TID BM  . ferrous sulfate  325 mg Oral BID WC  . furosemide  60 mg Intravenous Once  . hydrALAZINE  25 mg Oral TID  . insulin aspart  0-15 Units Subcutaneous TID WC  . insulin aspart  0-5 Units Subcutaneous QHS  . insulin glargine  17 Units Subcutaneous QHS  . levothyroxine  25 mcg Oral QAC breakfast  . multivitamin with minerals  1 tablet Oral Daily  . naloxegol oxalate  25 mg Oral Daily  . pantoprazole  40 mg Oral Daily  .  pregabalin  100 mg Oral Daily   And  . pregabalin  150 mg Oral BID  . vancomycin  1,250 mg Intravenous Q24H  . warfarin  7 mg Oral ONCE-1800  . Warfarin - Pharmacist Dosing Inpatient   Does not apply q1800    Continuous Infusions:    Time spent: 35 min  Jeralyn Bennett   [facility] Pager [phone] If 7PM-7AM, please contact night-coverage at [email or web address], Office  [phone]  password [email or web address] 01/13/2016, 12:37 PM  LOS: 18 days

## 2016-01-13 NOTE — Progress Notes (Signed)
ANTICOAGULATION CONSULT NOTE - Follow Up Consult  Pharmacy Consult for coumadin Indication: atrial fibrillation  Patient Measurements: Height: 5\' 10"  (177.8 cm) Weight: 159 lb 11.2 oz (72.4 kg) IBW/kg (Calculated) : 73  Vital Signs: Temp: 98.4 F (36.9 C) (10/01 0458) BP: 134/82 (10/01 0802) Pulse Rate: 106 (10/01 0800)  Labs:  Recent Labs  01/11/16 0456 01/12/16 0305 01/13/16 0538  HGB 8.8* 9.1* 9.0*  HCT 27.5* 29.5* 29.3*  PLT 236 242 248  LABPROT 22.1* 25.3* 24.2*  INR 1.91 2.25 2.14  CREATININE 1.55* 1.53*  --     Estimated Creatinine Clearance: 47.3 mL/min (by C-G formula based on SCr of 1.53 mg/dL (H)).   Medications:  Scheduled:  . sodium chloride   Intravenous Once  . amitriptyline  25 mg Oral QHS  . amLODipine  10 mg Oral Daily  . atorvastatin  40 mg Oral Daily  . carvedilol  6.25 mg Oral BID WC  . cefTRIAXone (ROCEPHIN)  IV  2 g Intravenous Q24H  . docusate sodium  100 mg Oral BID  . feeding supplement (GLUCERNA SHAKE)  237 mL Oral TID BM  . ferrous sulfate  325 mg Oral BID WC  . hydrALAZINE  25 mg Oral TID  . insulin aspart  0-15 Units Subcutaneous TID WC  . insulin aspart  0-5 Units Subcutaneous QHS  . insulin glargine  17 Units Subcutaneous QHS  . levothyroxine  25 mcg Oral QAC breakfast  . multivitamin with minerals  1 tablet Oral Daily  . naloxegol oxalate  25 mg Oral Daily  . pantoprazole  40 mg Oral Daily  . pregabalin  100 mg Oral Daily   And  . pregabalin  150 mg Oral BID  . vancomycin  1,250 mg Intravenous Q24H  . warfarin  7 mg Oral ONCE-1800  . 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 Coumadin PTA for afib - held initially for elevated INR. Now s/p angiogram and coumadin resumed. Patient is also s/p FFP on 9/28 and 9/29 as well as PO vitamin K x1 on 9/27. No overt signs or symptoms of bleeding noted. Of note - INR labile. Hgb low, but  stable.  Home coumadin dose: 7mg  po daily  Goal of Therapy:  INR 2-3 Monitor platelets by anticoagulation protocol: Yes   Plan:  -Coumadin 7 mg po x1 today -PT/INR daily -Monitor for signs and symptoms of bleeding   York CeriseKatherine Cook, PharmD Pharmacy Resident  Pager [phone] 01/13/16 8:36 AM

## 2016-01-13 NOTE — Progress Notes (Signed)
No cosign is needed.

## 2016-01-13 NOTE — Progress Notes (Signed)
CSW spoke with patient regarding discharge plans. CSW provided patient a list of facilities that currently have a bed available. Patient was unsure of facilities and asked CSW to contact niece, [name]. Patients niece did not answer and CSW left voicemail to call back. CSW will attempt again.  Stacy GardnerErin Breannah Kratt, LCSWA Clinical Social Worker [phone]

## 2016-01-14 ENCOUNTER — Telehealth: Payer: Self-pay | Admitting: Vascular Surgery

## 2016-01-14 LAB — CBC
HCT: 30.1 % — ABNORMAL LOW (ref 39.0–52.0)
Hemoglobin: 9.2 g/dL — ABNORMAL LOW (ref 13.0–17.0)
MCH: 28.3 pg (ref 26.0–34.0)
MCHC: 30.6 g/dL (ref 30.0–36.0)
MCV: 92.6 fL (ref 78.0–100.0)
Platelets: 268 10*3/uL (ref 150–400)
RBC: 3.25 MIL/uL — ABNORMAL LOW (ref 4.22–5.81)
RDW: 13.7 % (ref 11.5–15.5)
WBC: 8 10*3/uL (ref 4.0–10.5)

## 2016-01-14 LAB — BASIC METABOLIC PANEL
ANION GAP: 5 (ref 5–15)
BUN: 14 mg/dL (ref 6–20)
CALCIUM: 8.4 mg/dL — AB (ref 8.9–10.3)
CO2: 25 mmol/L (ref 22–32)
Chloride: 108 mmol/L (ref 101–111)
Creatinine, Ser: 1.48 mg/dL — ABNORMAL HIGH (ref 0.61–1.24)
GFR, EST AFRICAN AMERICAN: 54 mL/min — AB (ref >60)
GFR, EST NON AFRICAN AMERICAN: 47 mL/min — AB (ref >60)
GLUCOSE: 128 mg/dL — AB (ref 65–99)
POTASSIUM: 4 mmol/L (ref 3.5–5.1)
Sodium: 138 mmol/L (ref 135–145)

## 2016-01-14 LAB — GLUCOSE, CAPILLARY: Glucose-Capillary: 98 mg/dL (ref 65–99)

## 2016-01-14 LAB — PROTIME-INR
INR: 2.15
PROTHROMBIN TIME: 24.4 s — AB (ref 11.4–15.2)

## 2016-01-14 MED ORDER — ISOSORBIDE MONONITRATE ER 30 MG PO TB24: 30.0000 mg | ORAL_TABLET | Freq: Every day | ORAL | 1 refills | Status: DC

## 2016-01-14 MED ORDER — DOXYCYCLINE HYCLATE 100 MG PO TABS: 100.0000 mg | ORAL_TABLET | Freq: Two times a day (BID) | ORAL | 0 refills | Status: DC

## 2016-01-14 MED ORDER — ZOLPIDEM TARTRATE 5 MG PO TABS: 5.0000 mg | ORAL_TABLET | Freq: Every evening | ORAL | Status: DC | PRN

## 2016-01-14 MED ORDER — OXYCODONE HCL 10 MG PO TABS: 10.0000 mg | ORAL_TABLET | Freq: Four times a day (QID) | ORAL | 0 refills | Status: DC | PRN

## 2016-01-14 MED ORDER — FUROSEMIDE 20 MG PO TABS: 60.0000 mg | ORAL_TABLET | Freq: Every day | ORAL | 0 refills | Status: DC

## 2016-01-14 MED ORDER — NALOXEGOL OXALATE 12.5 MG PO TABS
12.5000 mg | ORAL_TABLET | Freq: Every day | ORAL | 0 refills | Status: DC
Start: 2016-01-15 — End: 2016-03-26

## 2016-01-14 MED ORDER — WARFARIN SODIUM 5 MG PO TABS: 7.0000 mg | ORAL_TABLET | Freq: Once | ORAL | Status: DC

## 2016-01-14 MED ORDER — DOCUSATE SODIUM 100 MG PO CAPS: 100.0000 mg | ORAL_CAPSULE | Freq: Two times a day (BID) | ORAL | 0 refills | Status: DC

## 2016-01-14 NOTE — Clinical Social Work Note (Signed)
[facility] called again for [facility] for [facility]. CSW spoke with patient's niece and notified her of plan. She is listed as primary contact. She stated that she had spoken with facility as well.  Charlynn CourtSarah Linzi Ohlinger, CSW [phone]

## 2016-01-14 NOTE — Clinical Social Work Note (Signed)
CSW facilitated patient discharge including contacting patient family and facility to confirm patient discharge plans. Clinical information faxed to facility and family agreeable with plan. CSW arranged ambulance transport via [facility] to [facility]. RN to call report prior to discharge ([phone]).  CSW will sign off for now as social work intervention is no longer needed. Please consult us again if new needs arise.  Denzell Colasanti, CSW [phone]   

## 2016-01-14 NOTE — Progress Notes (Signed)
[facility] arrived to pick up patient for discharge. Pt refusing to leave until he spoke with Dr. Vanessa BarbaraZamora about his discharge. This RN was at the bedside this am with Dr. Vanessa BarbaraZamora for roughly 10 minutes discussing discharge plan and follow up appointments. I tried to recap this morning's conversation with Ronald Simpson however my explanations were not sufficient. Dr. Vanessa BarbaraZamora paged and he spoke with patient over the phone regarding his concerns. Dr. Vanessa BarbaraZamora asked that I page vascular and ask if they will come back and speak with the patient regarding the dry gangrene areas on his left foot as they had signed off on 9/29. I spoke with Lelon MastSamantha, [place] with vascular who stated she will come by and speak with Ronald Simpson.  I also paged the ortho tech per order in regards to getting patient a new stump sock.  Ronald Simpson was updated on the above.

## 2016-01-14 NOTE — Progress Notes (Signed)
ANTICOAGULATION CONSULT NOTE - Follow Up Consult  Pharmacy Consult for coumadin Indication: atrial fibrillation  Patient Measurements: Height: 5\' 10"  (177.8 cm) Weight: 159 lb 11.2 oz (72.4 kg) IBW/kg (Calculated) : 73  Vital Signs: Temp: 98.6 F (37 C) (10/02 0416) BP: 150/85 (10/02 0416) Pulse Rate: 86 (10/02 0416)  Labs:  Recent Labs  01/12/16 0305 01/13/16 0538 01/13/16 1147 01/14/16 0528  HGB 9.1* 9.0*  --  9.2*  HCT 29.5* 29.3*  --  30.1*  PLT 242 248  --  268  LABPROT 25.3* 24.2*  --  24.4*  INR 2.25 2.14  --  2.15  CREATININE 1.53*  --  1.45* 1.48*    Estimated Creatinine Clearance: 48.9 mL/min (by C-G formula based on SCr of 1.48 mg/dL (H)).  Assessment: 70 yo male with wound infection and s/p R AKA on 9/25. Coumadin 7mg  daily for Afib and hx of CVA. INR on admission was 4.82. Coumadin held for R AKA 9/25.  Coumadin restarted 9/25 (10mg  x 1) then the decision was made to hold again for arteriogram on 9/29 (no hemodynamically significant stenosis). Planning to discharge home today, INR stable at 2.15. Hgb stable at 9.2, plts wnl.  Goal of Therapy:  INR 2-3 Monitor platelets by anticoagulation protocol: Yes   Plan:  Continue Coumadin 7mg  x 1 tonight if not discharged yet Monitor daily INR, CBC, s/s of bleed  Enzo BiNathan Tarynn Garling, PharmD, [facility] SystemBCPS Clinical Pharmacist Pager [phone] 01/14/2016 9:56 AM
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
## 2016-01-14 NOTE — Care Management Note (Signed)
Case Management Note  Patient Details  Name: Ronald Simpson Start MRN: [id number] Date of Birth: 22-Mar-1946  Subjective/Objective:  Pt presented for Sepsis. Plan will be [facility].                   Action/Plan: CSW assisting with disposition needs. No needs identified by CM at this time.  Expected Discharge Date:                  Expected Discharge Plan:  Skilled Nursing Facility  In-House Referral:  Clinical Social Work  Discharge planning Services  CM Consult  Post Acute Care Choice:  NA Choice offered to:  NA  DME Arranged:  N/A DME Agency:  NA  HH Arranged:  NA HH Agency:  NA  Status of Service:  Completed, signed off  If discussed at Long Length of Stay Meetings, dates discussed:    Additional Comments:  Gala LewandowskyGraves-Bigelow, Ja Pistole Kaye, RN 01/14/2016, 11:03 AM

## 2016-01-14 NOTE — Clinical Social Work Note (Signed)
CSW met with patient and discussed bed offers. CIR unable to take him. Patient has chosen [facility]. RN and facility notified. CSW called and left voicemail for patient's mother per request from patient. Patient requesting PTAR.  Dayton Scrape, [place]

## 2016-01-14 NOTE — Progress Notes (Signed)
Inpatient Rehabilitation  Pt. has declined therapies on multiple occasions recently, including 2 refusals this am.  Pt. would likely not be able or willing to participate in the intensity of IP Rehab.  He has selected a SNF bed.  I will sign off.  Please call if questions.  Lenville Hibberd PT [facility] Admissions Coordinator Cell [phone] Office [phone]

## 2016-01-14 NOTE — Telephone Encounter (Signed)
-----   Message from Sharee PimpleMarilyn K McChesney, RN sent at 01/11/2016  3:55 PM EDT ----- Regarding: 2-3 weeks   ----- Message ----- From: Larina Earthlyodd F Early, MD Sent: 01/11/2016   3:14 PM To: Vvs Charge Pool  Need to see this patient in 2-3 weeks for staple removal from right above-knee amputation. He can stay on the screening list in brackets

## 2016-01-14 NOTE — Telephone Encounter (Signed)
appt moved to 10/17 due to staple removal

## 2016-01-14 NOTE — Progress Notes (Signed)
[facility] Progress Note Patient Details:  [name] Frasco [birth year] [id number]  Patient ID: [name] Armenti, male   DOB: [birth year], 70 y.o.   MRN: [id number]   Saul FordyceJennifer C Vallie Fayette 01/14/2016, 1:24 PMCalled [employer] for Stump sock.

## 2016-01-14 NOTE — Progress Notes (Signed)
OT Cancellation Note  Patient Details Name: [name] Carrozza MRN: [id number] DOB: [birth year]   Cancelled Treatment:    Reason Eval/Treat Not Completed: Patient declined, no reason specified . Pt has declined x 2 this morning Galen ManilaSpencer, Loyalty Brashier Jeanette 01/14/2016, 10:32 AM

## 2016-01-14 NOTE — Clinical Social Work Placement (Signed)
   CLINICAL SOCIAL WORK PLACEMENT  NOTE  Date:  01/14/2016  Patient Details  Name: Ronald Simpson MRN: [id number] Date of Birth: 03-05-1946  Clinical Social Work is seeking post-discharge placement for this patient at the Skilled  Nursing Facility level of care (*CSW will initial, date and re-position this form in  chart as items are completed):  Yes   Patient/family provided with [facility] Clinical Social Work Department's list of facilities offering this level of care within the geographic area requested by the patient (or if unable, by the patient's family).  Yes   Patient/family informed of their freedom to choose among providers that offer the needed level of care, that participate in Medicare, Medicaid or managed care program needed by the patient, have an available bed and are willing to accept the patient.  Yes   Patient/family informed of [facility]'s ownership interest in [facility] and [facility], as well as of the fact that they are under no obligation to receive care at these facilities.  PASRR submitted to EDS on 01/08/16     PASRR number received on 01/08/16     Existing PASRR number confirmed on       FL2 transmitted to all facilities in geographic area requested by pt/family on 01/08/16     FL2 transmitted to all facilities within larger geographic area on       Patient informed that his/her managed care company has contracts with or will negotiate with certain facilities, including the following:        Yes   Patient/family informed of bed offers received.  Patient chooses bed at [facility]     Physician recommends and patient chooses bed at      Patient to be transferred to [facility] on 01/14/16.  Patient to be transferred to facility by [facility]     Patient family notified on 01/14/16 of transfer.  Name of family member notified:  Santo Heldlementine Cantrall (Mother: Left voicemail)     PHYSICIAN Please sign FL2, Please  prepare prescriptions     Additional Comment:    _______________________________________________ Margarito LinerSarah C Seleni Meller, LCSW 01/14/2016, 10:47 AM

## 2016-01-14 NOTE — Care Management Important Message (Signed)
Important Message  Patient Details  Name: Ronald Simpson MRN: [id number] Date of Birth: 01-Mar-1946   Medicare Important Message Given:  Yes    Gala LewandowskyGraves-Bigelow, Lakiyah Arntson Kaye, RN 01/14/2016, 11:02 AM

## 2016-01-15 ENCOUNTER — Non-Acute Institutional Stay (SKILLED_NURSING_FACILITY): Payer: Medicare Other | Admitting: Internal Medicine

## 2016-01-15 ENCOUNTER — Encounter: Payer: Self-pay | Admitting: Internal Medicine

## 2016-01-15 DIAGNOSIS — N5089 Other specified disorders of the male genital organs: Secondary | ICD-10-CM | POA: Insufficient documentation

## 2016-01-15 DIAGNOSIS — N289 Disorder of kidney and ureter, unspecified: Secondary | ICD-10-CM | POA: Insufficient documentation

## 2016-01-15 DIAGNOSIS — E1151 Type 2 diabetes mellitus with diabetic peripheral angiopathy without gangrene: Secondary | ICD-10-CM

## 2016-01-15 DIAGNOSIS — A419 Sepsis, unspecified organism: Secondary | ICD-10-CM | POA: Diagnosis not present

## 2016-01-15 DIAGNOSIS — I96 Gangrene, not elsewhere classified: Secondary | ICD-10-CM | POA: Diagnosis not present

## 2016-01-15 DIAGNOSIS — I480 Paroxysmal atrial fibrillation: Secondary | ICD-10-CM | POA: Diagnosis not present

## 2016-01-15 NOTE — Assessment & Plan Note (Signed)
   Doxycycline twice a day 14 days as an outpatient

## 2016-01-15 NOTE — Progress Notes (Signed)
Facility Location: [facility]  Room Number: 223-A  Code Status:  Full Code   PCP: [name], MD No address on file    This is a comprehensive admission note to [facility] performed on this date less than 30 days from date of admission. Included are preadmission medical/surgical history;reconciled medication list; family history; social history and comprehensive review of systems.  Corrections and additions to the records were documented . Comprehensive physical exam was also performed. Additionally a clinical summary was entered for each active diagnosis pertinent to this admission in the Problem List to enhance continuity of care.  HPI: The patient was hospitalized 9/13-10/2/17 with sepsis. He presented with malaise present for 4 days. He had an incredibly long and complicated course. He had right lower extremity amputation in May in [place]. He did fall  the week prior to admission on the stump and also injured the left toes. He was seen in emergency room for blackening discoloration of the toes. He refused admission at that time. He was prescribed oxycodone in the emergency room. Subsequent to this he developed the malaise with decreased appetite and mental status changes. EMS was called and he was taken to the emergency room and was admitted. He was found to be septic related to gangrenous changes in 2 toes of the left foot area.  CT revealed subcutaneous edema around the ankle tracking up into the foot. Left foot wound revealed polymicrobial organisms on C&S including MRSA and Serratia marcescens.Vancomycin and Zosyn was transitioned to vancomycin and ceftriaxone. He was discharged on doxycycline for 14 days. 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 Arbie Cookey one month after discharge Hospital course was, complicated by volume overload manifested as significant  scrotal edema. He was discharged on Lasix 60 mg daily along with spironolactone. Lab abnormalities of the time of discharge included a creatinine of 1.48, hematocrit 30.1 and glucose of 128. INR was therapeutic at 2.15.  Past medical and surgical history: Past history is also  incredibly complicated. Diagnoses include hypertension, hypothyroidism, history of hepatitis C and hepatitis B with hepatic cirrhosis , insulin-dependent diabetes, paroxysmal atrial fibrillation (on warfarin), coronary artery disease, and history of stroke  Social history: He was smoking a half a pack a day until the day of admission. He states he drinks 2 beers per day on average.  Family history:  His father was diabetic  Review of systems: He describes constipation for the last 2 days. Stools are small, hard, and occasionally black. He is not describing frank melena. He has no other GI symptoms. His is pain in the scrotal area at the site of the swelling with any movement. He denies any other genitourinary symptoms. He has occasional productive cough.  Constitutional: No fever,significant weight change, fatigue  Eyes: No redness, discharge, pain, vision change ENT/mouth: No nasal congestion,  purulent discharge, earache,change in hearing ,sore throat  Cardiovascular: No chest pain, palpitations,paroxysmal nocturnal dyspnea, claudication, edema  Respiratory: No hemoptysis, DOE , significant snoring,apnea   Gastrointestinal: No heartburn,dysphagia,abdominal pain, nausea / vomiting,rectal bleeding Genitourinary: No dysuria,hematuria, pyuria,  incontinence, nocturia Musculoskeletal: No joint stiffness, joint swelling, weakness,pain Dermatologic: No rash, pruritus Neurologic: No dizziness,headache,syncope, seizures, numbness , tingling Psychiatric: No significant anxiety , depression, insomnia, anorexia Endocrine: No change in hair/skin/ nails, excessive thirst, excessive hunger, excessive urination    Hematologic/lymphatic: No significant bruising, lymphadenopathy,abnormal bleeding Allergy/immunology: No itchy/ watery eyes, significant sneezing, urticaria, angioedema  Physical  exam:  Pertinent or positive findings: Pupils are small but reactive to light. He has a beard and mustache. The maxilla is edentulous. He has a few remaining mandibular teeth. He has an occasional premature beat. There is massive edema of the scrotum. He has 1+ edema of the left foot. Pedal pulses are decreased on the left. The second and fourth toes on the left are hyperpigmented and reveal exfoliative dermatitis. He has amputation of the right lower extremity.Clubbing of nail beds present. General appearance:Adequately nourished; no acute distress , increased work of breathing is present.   Lymphatic: No lymphadenopathy about the head, neck, axilla . Eyes: No conjunctival inflammation or lid edema is present. There is no scleral icterus. Ears:  External ear exam shows no significant lesions or deformities.   Nose:  External nasal examination shows no deformity or inflammation. Nasal mucosa are pink and moist without lesions ,exudates Oral exam: lips and gums are healthy appearing.There is no oropharyngeal erythema or exudate . Neck:  No thyromegaly, masses, tenderness noted.    Heart:  Normal rate and regular rhythm. S1 and S2 normal without gallop, murmur, click, rub .  Lungs:Chest clear to auscultation without wheezes, rhonchi,rales , rubs. Abdomen:Bowel sounds are normal. Abdomen is soft and nontender with no organomegaly, hernias,masses. Neurologic exam : Strength equal  in upper extremities Balance,Rhomberg,finger to nose testing could not be completed due to clinical state Deep tendon reflexes are equal in UE Psych: he began cursing when informed Vasc F/U appt was 4 weeks post discharge Skin: Warm & dry w/o tenting. No significant rash.  See clinical summary under each active problem in the Problem List with  associated updated therapeutic plan

## 2016-01-15 NOTE — Patient Instructions (Signed)
Complete 14 days doxycycline. [facility] Wound care consult for the dry gangrene of the toes. Protein supplement to treat the scrotal edema and hypoalbuminemia. Monitor renal function on spironolactone.

## 2016-01-15 NOTE — Assessment & Plan Note (Signed)
Protein supplementation. °

## 2016-01-15 NOTE — Assessment & Plan Note (Signed)
12/27/15 A1c 9.1% Monitor glucoses ; adjust insulin as indicated

## 2016-01-15 NOTE — Assessment & Plan Note (Signed)
Monitor renal function closely in view of spironolactone therapy

## 2016-01-15 NOTE — Assessment & Plan Note (Signed)
01/15/16 Clinically in regular rhythm with controlled ventricular rate at this time Continue warfarin

## 2016-01-15 NOTE — Assessment & Plan Note (Signed)
01/15/16 Dr. Bosie HelperEarly's  recommendations discussed with the patient Patient indicated he was dissatisfied with follow-up interval. Wound care will be consulted

## 2016-01-18 LAB — BASIC METABOLIC PANEL
BUN: 13 mg/dL (ref 4–21)
BUN: 13 mg/dL (ref 4–21)
CREATININE: 1.2 mg/dL (ref 0.6–1.3)
Creatinine: 1.2 mg/dL (ref 0.6–1.3)
GLUCOSE: 183 mg/dL
Glucose: 183 mg/dL
POTASSIUM: 4.1 mmol/L (ref 3.4–5.3)
Potassium: 4.1 mmol/L (ref 3.4–5.3)
Sodium: 135 mmol/L — AB (ref 137–147)
Sodium: 135 mmol/L — AB (ref 137–147)

## 2016-01-24 ENCOUNTER — Encounter: Payer: Self-pay | Admitting: Vascular Surgery

## 2016-01-24 LAB — PROTIME-INR: Protime: 32.7 seconds — AB (ref 10.0–13.8)

## 2016-01-24 LAB — POCT INR: INR: 3.2 — AB (ref 0.9–1.1)

## 2016-01-29 ENCOUNTER — Encounter: Payer: Self-pay | Admitting: Vascular Surgery

## 2016-01-29 ENCOUNTER — Ambulatory Visit (INDEPENDENT_AMBULATORY_CARE_PROVIDER_SITE_OTHER): Payer: Medicare Other | Admitting: Vascular Surgery

## 2016-01-29 VITALS — BP 122/79 | HR 98 | Temp 98.4°F | Resp 16 | Ht 70.0 in | Wt 160.0 lb

## 2016-01-29 DIAGNOSIS — Z89611 Acquired absence of right leg above knee: Secondary | ICD-10-CM

## 2016-01-29 NOTE — Progress Notes (Signed)
Patient name: Ronald Simpson MRN: [id number] DOB: 24-Sep-1945 Sex: male  REASON FOR VISIT: Hospital follow-up from right above-knee amputationBonner Simpson  HPI: Ronald PunaFletcher Delker is a 70 y.o. male for follow-up. Very complex patient. He initially had had a gangrenous changes to the toes of his right foot and the probably [employer]. Underwent education of these with nonhealing and eventual right below-knee amputation. He [facility] to [facility] with the gangrenous changes and severe infection of this with pus pouring out of his amputation site. He initially was resistant to surgery but then agreed to above-knee amputation conversion. He also had dry gangrene on the tips of his left second and fourth toe.  Current Outpatient Prescriptions  Medication Sig Dispense Refill  . amitriptyline (ELAVIL) 25 MG tablet Take 25 mg by mouth at bedtime.    Ronald Simpson. atorvastatin (LIPITOR) 40 MG tablet Take 40 mg by mouth daily.    . carvedilol (COREG) 6.25 MG tablet Take 6.25 mg by mouth 2 (two) times daily with a meal.    . docusate sodium (COLACE) 100 MG capsule Take 1 capsule (100 mg total) by mouth 2 (two) times daily. 10 capsule 0  . doxycycline (VIBRA-TABS) 100 MG tablet Take 1 tablet (100 mg total) by mouth 2 (two) times daily. 28 tablet 0  . ferrous sulfate 325 (65 FE) MG tablet Take 325 mg by mouth 2 (two) times daily with a meal.    . furosemide (LASIX) 20 MG tablet Take 3 tablets (60 mg total) by mouth daily. 30 tablet 0  . hydrALAZINE (APRESOLINE) 25 MG tablet Take 25 mg by mouth 3 (three) times daily.    . insulin glargine (LANTUS) 100 unit/mL SOPN Inject 17 Units into the skin at bedtime.    . isosorbide mononitrate (IMDUR) 30 MG 24 hr tablet Take 1 tablet (30 mg total) by mouth daily. 30 tablet 1  . levothyroxine (SYNTHROID, LEVOTHROID) 25 MCG tablet Take 25 mcg by mouth daily before breakfast.    . naloxegol oxalate (MOVANTIK) 12.5 MG TABS tablet Take 1 tablet (12.5 mg  total) by mouth daily. 30 tablet 0  . nitroGLYCERIN (NITROSTAT) 0.4 MG SL tablet Place 0.4 mg under the tongue every 5 (five) minutes as needed for chest pain.    Ronald Simpson. oxyCODONE 10 MG TABS Take 1 tablet (10 mg total) by mouth every 6 (six) hours as needed for severe pain. 15 tablet 0  . pregabalin (LYRICA) 50 MG capsule Take 150 mg three times daily    . spironolactone (ALDACTONE) 25 MG tablet Take 25 mg by mouth daily.    Ronald Simpson. warfarin (COUMADIN) 3 MG tablet Take 7 mg by mouth daily. Takes along with a 4mg  tablet     No current facility-administered medications for this visit.      PHYSICAL EXAM: Vitals:   01/29/16 1133  BP: 122/79  Pulse: 98  Resp: 16  Temp: 98.4 F (36.9 C)  TempSrc: Oral  SpO2: 99%  Weight: 160 lb (72.6 kg)  Height: 5\' 10"  (1.778 m)    GENERAL: The patient is a well-nourished male, in no acute distress. The vital signs are documented above. Well-healed right above-knee amputation stump for staples out today left leg with easily palpable popliteal pulse. Dry gangrene on the undersurface of the tip of his second toe and he'll aspect of his left toe. Surrounding erythema   MEDICAL ISSUES: Stable overall. Will DC his staples from his AKA today. Does have dry gangrene with palpable popliteal pulse. 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 Would recommend continued daily bathing of this and dry dressing. We'll see him again in one month. Explained that he may have to have amputation of his toes. He is A quite concerned since this is how he initially presented and ended up with above-knee amputation. Reassured that he should have adequate flow for healing this and will see him again in one month  Ronald Earthly, MD [facility] Tel [phone] Pager [phone]

## 2016-01-31 ENCOUNTER — Encounter: Payer: Self-pay | Admitting: Internal Medicine

## 2016-01-31 ENCOUNTER — Non-Acute Institutional Stay (SKILLED_NURSING_FACILITY): Payer: Medicare Other | Admitting: Internal Medicine

## 2016-01-31 DIAGNOSIS — N289 Disorder of kidney and ureter, unspecified: Secondary | ICD-10-CM

## 2016-01-31 DIAGNOSIS — I96 Gangrene, not elsewhere classified: Secondary | ICD-10-CM

## 2016-01-31 DIAGNOSIS — I480 Paroxysmal atrial fibrillation: Secondary | ICD-10-CM | POA: Diagnosis not present

## 2016-01-31 DIAGNOSIS — G63 Polyneuropathy in diseases classified elsewhere: Secondary | ICD-10-CM | POA: Diagnosis not present

## 2016-01-31 DIAGNOSIS — G629 Polyneuropathy, unspecified: Secondary | ICD-10-CM | POA: Insufficient documentation

## 2016-01-31 DIAGNOSIS — E1151 Type 2 diabetes mellitus with diabetic peripheral angiopathy without gangrene: Secondary | ICD-10-CM

## 2016-01-31 LAB — POCT INR: INR: 4.1 — AB (ref <=1.1)

## 2016-01-31 LAB — PROTIME-INR: PROTIME: 41.2 s — AB (ref 10.0–13.8)

## 2016-01-31 NOTE — Patient Instructions (Signed)
PT/INR and BMET 10/23

## 2016-01-31 NOTE — Assessment & Plan Note (Signed)
PT/INR high therapeutic;warfarin dose adjusted PT/INR 10/23

## 2016-01-31 NOTE — Assessment & Plan Note (Signed)
GFR of 52 warts reinstituting pre-Gamblin at a dose of 100 mg 3 times a day. Renal function will be reassessed to verify stability.

## 2016-01-31 NOTE — Progress Notes (Signed)
   PCP: Dr Ilsa IhaSnyder, [place]  This is a nursing facility follow up for assessment of polypharmacy and hepatorenal risk.  Interim medical record and care since last [facility] visit was updated with review of diagnostic studies and change in clinical status since last visit were documented.  HPI: The patient had been on generic Lyrica 150 mg 3 times a day for at least 2 years from Dr. [place]ls[place] for peripheral neuropathy ;but he has had some renal insufficiency. His creatinine has varied from a high of 1.53 to 1.48 on 10/2. GFR was 52 and 54, mildly reduced. The Lyrica was changed to gabapentin which has not provided efficacious response for the patient. Up to Date recommends a maximum of 300 mg of pregabalin with a GFR at this level.  His most recent A1c was 9.1% on 12/27/15. He is on Lantus 17 units at bedtime He saw his vascular surgeon 01/29/16 for follow-up of the above the knee amputation conversion necessitated by frankly purulent wounds. Staples were discontinued; dry gangrene was diagnosed. Popliteal pulse was palpable. Follow-up was recommended approximately 11/17. He is on warfarin without any active bleeding dyscrasias. PT/INR was 4.1 today warfarin was adjusted on 10/23.  Review of systems: Epistaxis, hemoptysis, hematuria, melena, or rectal bleeding denied. No unexplained weight loss, significant dyspepsia,dysphagia, or abdominal pain.  There is no abnormal bruising , bleeding, or difficulty stopping bleeding with injury. The severe scrotal swelling has resolved.  Physical exam:  Pertinent or positive findings: His head is shaven. His face is unshaven. He has no upper teeth, he has a few lower teeth in fair repair. The AKA amputation of the right reveals no evidence of cellulitis.There is atrophy of the left lower extremity. Pedal pulses are decreased on the left. He has scattered hyperpigmentation over the anterior left lower extremity General  appearance:Adequately nourished; no acute distress , increased work of breathing is present.   Lymphatic: No lymphadenopathy about the head, neck, axilla Eyes: No conjunctival inflammation or lid edema is present. There is no scleral icterus. Ears:  External ear exam shows no significant lesions or deformities.   Nose:  External nasal examination shows no deformity or inflammation. Nasal mucosa are pink and moist without lesions ,exudates Oral exam: lips and gums are healthy appearing.There is no oropharyngeal erythema or exudate . Neck:  No thyromegaly, masses, tenderness noted.    Heart:  Normal rate and regular rhythm. S1 and S2 normal without gallop, murmur, click, rub .  Lungs:Chest clear to auscultation without wheezes, rhonchi,rales , rubs. Abdomen:Bowel sounds are normal. Abdomen is soft and nontender with no organomegaly, hernias,masses. Extremities:  No cyanosis, clubbing,edema  Neurologic exam : Strength equal  in upper extremities Balance,Rhomberg,finger to nose testing could not be completed due to clinical state Skin: Warm & dry w/o tenting. No significant lesions or rash.    See summary under each active problem in the Problem List with associated updated therapeutic plan

## 2016-01-31 NOTE — Assessment & Plan Note (Signed)
Stable as per Dr. Arbie CookeyEarly 10/17;F/U 11/17

## 2016-01-31 NOTE — Assessment & Plan Note (Signed)
Continue Lantus 17 units at bedtim;eBMET 10/23

## 2016-01-31 NOTE — Assessment & Plan Note (Signed)
Decrease Lyrica to 100 mg 3 times a day and recheck renal function 10/23

## 2016-02-04 LAB — BASIC METABOLIC PANEL
BUN: 28 mg/dL — AB (ref 4–21)
CREATININE: 1.6 mg/dL — AB (ref <=1.3)
Glucose: 195 mg/dL
Potassium: 4.9 mmol/L (ref 3.4–5.3)
Sodium: 132 mmol/L — AB (ref 137–147)

## 2016-02-04 LAB — POCT INR: INR: 3 — AB (ref <=1.1)

## 2016-02-04 LAB — PROTIME-INR: PROTIME: 30.2 s — AB (ref 10.0–13.8)

## 2016-02-05 ENCOUNTER — Encounter: Payer: Medicare Other | Admitting: Vascular Surgery

## 2016-02-08 ENCOUNTER — Other Ambulatory Visit: Payer: Self-pay | Admitting: *Deleted

## 2016-02-08 MED ORDER — PREGABALIN 100 MG PO CAPS: ORAL_CAPSULE | ORAL | 0 refills | Status: DC

## 2016-02-08 NOTE — Telephone Encounter (Signed)
[facility] [phone] Fax: [phone]  

## 2016-02-11 LAB — PROTIME-INR: PROTIME: 16.5 s — AB (ref 10.0–13.8)

## 2016-02-11 LAB — POCT INR: INR: 1.6 — AB (ref 0.9–1.1)

## 2016-02-14 ENCOUNTER — Encounter: Payer: Self-pay | Admitting: Internal Medicine

## 2016-02-14 ENCOUNTER — Non-Acute Institutional Stay (SKILLED_NURSING_FACILITY): Payer: Medicare Other | Admitting: Internal Medicine

## 2016-02-14 DIAGNOSIS — G8918 Other acute postprocedural pain: Secondary | ICD-10-CM

## 2016-02-14 DIAGNOSIS — E889 Metabolic disorder, unspecified: Secondary | ICD-10-CM

## 2016-02-14 DIAGNOSIS — Z9189 Other specified personal risk factors, not elsewhere classified: Secondary | ICD-10-CM

## 2016-02-14 DIAGNOSIS — N289 Disorder of kidney and ureter, unspecified: Secondary | ICD-10-CM

## 2016-02-14 DIAGNOSIS — G990 Autonomic neuropathy in diseases classified elsewhere: Secondary | ICD-10-CM

## 2016-02-14 DIAGNOSIS — G63 Polyneuropathy in diseases classified elsewhere: Secondary | ICD-10-CM

## 2016-02-14 NOTE — Assessment & Plan Note (Signed)
Recheck BMET see if Lyrica dose can be increased. Up to Date did state that gabapentin has not been effective for phantom pain The OxyContin will be changed every 8 hours as needed in anticipation of him being discharged next week In reference to his comment about rather dying than giving up his pain pill, I emphasized my responsibility not to increase his health risk I informed him that I would not be able to provide a long term prescription for controlled substances once he leaves SNF A prescriber will need to establish a controlled substance contract if he continues on this medicine.

## 2016-02-14 NOTE — Assessment & Plan Note (Signed)
Recheck BMET to see if there could dose can be increased

## 2016-02-14 NOTE — Patient Instructions (Signed)
See Current Assessment & Plan in Problem List under specific Diagnosis 

## 2016-02-14 NOTE — Progress Notes (Signed)
   This is a nursing facility follow up for specific acute issue of increased adverse drug reaction related to scheduled opioid use.  Interim medical record and care since last [facility] visit was updated with review of diagnostic studies and change in clinical status since last visit were documented.  HPI: Oxycodone 5 mg, 1-2 every 6 hours was ordered for postoperative pain. Despite the interval since his surgery, he complains of persistent pain in the toes of the left foot. He also describes phantom pain on the right following a BKA 01/07/16. He has dry gangrene of the toes. Gabapentin did not provide adequate relief and he was switched back to Lyrica. Problematic has been the creatinine and GFR. GFR has ranged from 51-56. Creatinine has been as high as 1.55. The most recent creatinine was 1.2. Based on the GFR total Lyrica dose should be less than 300 mg per day. Based on him receiving oxycodone 10 mg every 6 hours on a regular basis as per the staff, he is on 60  milligrams of MME.  Based on his comorbidities he has at least a 30% risk of serious adverse reaction or even respiratory suppression and death over the next  6 months. The risk may actually be over 80%; it depends on whether he has COPD and whether use of NicoDerm is equivalent to continued smoking.  Review of systems: He describes a phantom pain in the right leg which has been amputated. He describes throbbing pain in the toes of the left foot dry gangrene. Over the last 3 weeks he has lost 20.6 pounds; at the time of admission he had massive edema of the scrotum and 1+ edema of the left foot. Both of these have resolved.  Physical exam:  Pertinent or positive findings: He has pattern alopecia. He has a beard and mustache His speech is slurred. He has intermittent resting exotropia of the left eye. The maxilla is edentulous. He has few remaining lower teeth.  Breath sounds are decreased.  He is wearing NicoDerm patch  over the right upper extremity. There is hyperpigmentation over the left shin. Pedal pulses on the left are decreased. Sites of dry gangrene of the left foot are dressed. He became very argumentative when I expressed concerns about his health risk with the oxycodone. I explained the opioid risk calculation tool to him.  He stated "I'd rather die be in pain". He states he is familiar with the opioid epidemic based on TV programs.  General appearance:Adequately nourished; no acute distress , increased work of breathing is present.   Lymphatic: No lymphadenopathy about the head, neck, axilla . Eyes: No conjunctival inflammation or lid edema is present. There is no scleral icterus. Ears:  External ear exam shows no significant lesions or deformities.   Oral exam: lips and gums are healthy appearing.There is no oropharyngeal erythema or exudate . Neck:  No thyromegaly, masses, tenderness noted.    Heart:  Normal rate and regular rhythm. S1 and S2 normal without gallop, murmur, click, rub .  Lungs:Chest clear to auscultation without wheezes, rhonchi,rales , rubs. Abdomen:Bowel sounds are normal. Abdomen is soft and nontender with no organomegaly, hernias,masses. GU: deferred  Extremities:  No cyanosis, edema  Neurologic exam : Balance,Rhomberg,finger to nose testing could not be completed due to RLE amputation Skin: Warm & dry w/o tenting. No significant rash.    See summary under each active problem in the Problem List with associated updated therapeutic plan

## 2016-02-14 NOTE — Assessment & Plan Note (Signed)
Recheck BMET to see if Lyrica dose can be increased

## 2016-02-16 LAB — BASIC METABOLIC PANEL
BUN: 30 mg/dL — AB (ref 4–21)
Creatinine: 1.8 mg/dL — AB (ref 0.6–1.3)
GLUCOSE: 165 mg/dL
POTASSIUM: 4.4 mmol/L (ref 3.4–5.3)
SODIUM: 137 mmol/L (ref 137–147)

## 2016-02-16 LAB — CBC AND DIFFERENTIAL
HCT: 27 % — AB (ref 41–53)
HEMOGLOBIN: 8.6 g/dL — AB (ref 13.5–17.5)
Platelets: 158 10*3/uL (ref 150–399)
WBC: 7.1 10^3/mL

## 2016-02-19 LAB — PROTIME-INR: PROTIME: 34.6 s — AB (ref 10.0–13.8)

## 2016-02-19 LAB — POCT INR: INR: 3.4 — AB (ref 0.9–1.1)

## 2016-02-21 ENCOUNTER — Encounter: Payer: Self-pay | Admitting: Internal Medicine

## 2016-02-21 ENCOUNTER — Non-Acute Institutional Stay (SKILLED_NURSING_FACILITY): Payer: Medicare Other | Admitting: Internal Medicine

## 2016-02-21 DIAGNOSIS — N289 Disorder of kidney and ureter, unspecified: Secondary | ICD-10-CM

## 2016-02-21 DIAGNOSIS — Z9189 Other specified personal risk factors, not elsewhere classified: Secondary | ICD-10-CM

## 2016-02-21 DIAGNOSIS — G63 Polyneuropathy in diseases classified elsewhere: Secondary | ICD-10-CM | POA: Diagnosis not present

## 2016-02-21 DIAGNOSIS — G8918 Other acute postprocedural pain: Secondary | ICD-10-CM

## 2016-02-21 NOTE — Progress Notes (Signed)
    This is an acute  Follow up visit for medication management  PCP: He lists his primary care physician as Dr Ilsa IhaSnyder who [place] in [place], [place]. The patient [place] a doctor in [place], [place].  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 He had postop pain following the  BKA surgical procedure. He has been taking his opioid pain medicines every 6 hours by the clock. On 11/2 the opioids were decreased to every 8 hours as needed from every 6. This was done because of opiod-induced constipation for which he was taking Movantik and opioid risk of over 80% risk of adverse effects or respiratory suppression. Renal function was reassessed and creatinine is 1.2. Lyrica had been decreased to 100 mg every 8 hours because of the elevated creatinine. His diabetes is poorly controlled as evidenced by an A1c of 9.1%. His basal insulin dose had been increased.  Past medical and surgical history;Social history reviewed:  Review of systems: He feels the BKA surgical wound is well-healed.The wound care nurse continues to follow the dry gangrene. She reports progressive improvement without complications.  He has stopped his participation in physical therapy due to limitation of days as per his insurance . He can get in and out of bed and go to the bathroom by himself employing a wheelchair. He wants to have his prosthesis fitted and then reenter physical therapy. Apparently the prosthetic specialist is awaiting OK from the vascular surgeon, Dr. Arbie CookeyEarly. He has a follow-up appointment with Dr. Arbie CookeyEarly in 3 weeks. He states his fasting blood sugars range from 130-200. It was in the 140s today. He denies any hypoglycemic spells. No excessive thirst, excessive hunger, excessive urination. The scrotal swelling which was so dramatic admission has completely resolved.  Physical exam:  Pertinent or positive  findings: Head is shaven. He has a beard and mustache. The maxilla is edentulous. He has a few mandibular teeth. Speech is slightly slurred. S4 is present. BKA on the right with well-healed surgical wound. Decreased left pedal pulses. Dry gangrene lesions dressed  General appearance:Adequately nourished; no acute distress , increased work of breathing is present.   Lymphatic: No lymphadenopathy about the head, neck, axilla . Eyes: No conjunctival inflammation or lid edema is present. There is no scleral icterus. Oral exam: lips and gums are healthy appearing.There is no oropharyngeal erythema or exudate . Neck:  No thyromegaly, masses, tenderness noted.    Heart:  Normal rate and regular rhythm.  S2 normal without gallop, murmur, click, rub .  Lungs:Chest clear to auscultation without wheezes, rhonchi,rales , rubs. Abdomen:Bowel sounds are normal. Abdomen is soft and nontender with no organomegaly, hernias,masses. GU: deferred as previously addressed. Extremities:  No cyanosis, clubbing,edema  Neurologic exam : Strength equal  in upper  extremities Balance,Rhomberg,finger to nose testing could not be completed due to clinical state Skin: Warm & dry w/o tenting. No significant rash.  See clinical summary under each active problem in the Problem List with associated updated therapeutic plan subsequent

## 2016-02-21 NOTE — Patient Instructions (Signed)
See Current Assessment & Plan in Problem List under specific Diagnosis Contact Dr. Arbie CookeyEarly concerning prosthesis placement for right BKA

## 2016-02-21 NOTE — Assessment & Plan Note (Signed)
He has components of phantom pain, literature provides little guidance as to optimal treatment. He is on amitriptyline at bedtime. He also has peripheral neuropathy for which opiods are not indicated. The pain related to his BKA 9/25 should have essentially resolved. His renal function is improved, pregabalin will be increased to 150 mg every 8 hours as needed and tramadol will be prescribed at 50 mg every 8 hours for breakthrough pain. Use of this agent would preclude changing the amitriptyline to Duloxetine because of potential drug drug interaction with the centrally acting pain blocker Duloxetine and tramadol.

## 2016-02-21 NOTE — Assessment & Plan Note (Signed)
Pregabalin dose increased as noted

## 2016-02-21 NOTE — Assessment & Plan Note (Signed)
Creatinine now normal, pregabalin will be increased to 150 mg 3 times a day

## 2016-02-21 NOTE — Assessment & Plan Note (Addendum)
Opioid risk & therapeutic changes discussed with the patient, he was receptive to addressing pain physiologically and optimally  His major goal is pursuing prosthesis placement to allow reinstituting physical therapy

## 2016-02-23 LAB — BASIC METABOLIC PANEL
BUN: 40 mg/dL — AB (ref 4–21)
CREATININE: 1.5 mg/dL — AB (ref 0.6–1.3)
Glucose: 99 mg/dL
Potassium: 4.9 mmol/L (ref 3.4–5.3)
Sodium: 137 mmol/L (ref 137–147)

## 2016-02-25 LAB — POCT INR: INR: 2.6 — AB (ref 0.9–1.1)

## 2016-02-25 LAB — PROTIME-INR: PROTIME: 27.8 s — AB (ref 10.0–13.8)

## 2016-02-27 ENCOUNTER — Encounter: Payer: Self-pay | Admitting: Vascular Surgery

## 2016-02-29 ENCOUNTER — Encounter: Payer: Self-pay | Admitting: Nurse Practitioner

## 2016-02-29 ENCOUNTER — Non-Acute Institutional Stay (SKILLED_NURSING_FACILITY): Payer: Medicare Other | Admitting: Nurse Practitioner

## 2016-02-29 DIAGNOSIS — N289 Disorder of kidney and ureter, unspecified: Secondary | ICD-10-CM

## 2016-02-29 DIAGNOSIS — E1151 Type 2 diabetes mellitus with diabetic peripheral angiopathy without gangrene: Secondary | ICD-10-CM | POA: Diagnosis not present

## 2016-02-29 DIAGNOSIS — I480 Paroxysmal atrial fibrillation: Secondary | ICD-10-CM

## 2016-02-29 DIAGNOSIS — E889 Metabolic disorder, unspecified: Secondary | ICD-10-CM | POA: Diagnosis not present

## 2016-02-29 DIAGNOSIS — I96 Gangrene, not elsewhere classified: Secondary | ICD-10-CM

## 2016-02-29 DIAGNOSIS — G63 Polyneuropathy in diseases classified elsewhere: Secondary | ICD-10-CM

## 2016-02-29 DIAGNOSIS — D649 Anemia, unspecified: Secondary | ICD-10-CM | POA: Diagnosis not present

## 2016-02-29 DIAGNOSIS — G990 Autonomic neuropathy in diseases classified elsewhere: Secondary | ICD-10-CM

## 2016-02-29 NOTE — Progress Notes (Signed)
Nursing Home Location:  [facility]  Place of Service: SNF (31)  PCP: Tilden DomeSCHNEIDER,RICHARD, MD  No Known Allergies  Chief Complaint  Patient presents with  . Medical Management of Chronic Issues    Routine Visit    HPI:  Patient is a 70 y.o. male [facility] up. Pt with hx of CVA, hep b and C, PVD, AKA, CAD, DM  Pt has had a lot of phantom pain, earlier this month lyrica was increase to 150 mg PO TID which he reports has helped. Does not feel like he gets significant benefit from oxycodone 10 mg and there has been attempts to wean, he has been cut back fom q 6 to q 8 hours.  No constipation noted.  Cont on coumadin 7 mg for a fib, rate controlled.  No swelling.   Review of Systems:  Review of Systems  Constitutional: Negative for chills and fever.  HENT: Negative for tinnitus.   Respiratory: Negative for cough and shortness of breath.   Cardiovascular: Negative for chest pain, palpitations and leg swelling.  Gastrointestinal: Negative for abdominal pain, constipation and diarrhea.  Genitourinary: Negative for dysuria, frequency and urgency.  Musculoskeletal: Positive for arthralgias. Negative for back pain and myalgias.  Skin: Positive for wound.       Gangrene to left toes, stable.   Neurological: Negative for dizziness and headaches.       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 CAD (coronary artery disease)    01/15/16 he states he has had a heart attack  . CVA (cerebral infarction)   . DM (diabetes mellitus), type 2 with peripheral vascular complications (HCC)   . History of hepatitis B   . History of hepatitis C   . Peripheral vascular disease [facility](HCC)    Past Surgical History:  Procedure Laterality Date  . AMPUTATION Right 01/07/2016   Procedure: AMPUTATION ABOVE KNEE;  Surgeon: Larina Earthlyodd F Early, MD;  [facility];  Service: Vascular;  Laterality: Right;  . BELOW KNEE LEG AMPUTATION  Right   . CORONARY ARTERY BYPASS GRAFT  2005  . PERIPHERAL VASCULAR CATHETERIZATION N/A 01/11/2016   Procedure: Lower Extremity Angiography;  Surgeon: Nada LibmanVance W Brabham, MD;  [facility];  Service: Cardiovascular;  Laterality: N/A;  . STERNOTOMY     Social History:   reports that he has been smoking.  He has been smoking about 0.50 packs per day. He has never used smokeless tobacco. He reports that he drinks about 8.4 oz of alcohol per week . He reports that he uses drugs, including Cocaine and Marijuana.  Family History  Problem Relation Age of Onset  . Hypertension Other   . Diabetes Other   . Mental illness Other   . Lupus Other   . Diabetes Father   . Cancer Neg Hx   . Heart disease Neg Hx   . Stroke Neg Hx     Medications: Patient's Medications  New Prescriptions   No medications on file  Previous Medications   ACETAMINOPHEN (TYLENOL) 325 MG TABLET    Take 650 mg by mouth every 6 (six) hours as needed for mild pain, moderate pain or fever.   AMBULATORY NON FORMULARY MEDICATION    Magic Cup Sig: One twice daily   AMITRIPTYLINE (ELAVIL) 25 MG TABLET    Take 25 mg by mouth at bedtime.   ATORVASTATIN (LIPITOR) 40 MG  TABLET    Take 40 mg by mouth daily.   CARVEDILOL (COREG) 6.25 MG TABLET    Take 6.25 mg by mouth 2 (two) times daily with a meal.   DOCUSATE SODIUM (COLACE) 100 MG CAPSULE    Take 1 capsule (100 mg total) by mouth 2 (two) times daily.   FERROUS SULFATE 325 (65 FE) MG TABLET    Take 325 mg by mouth 2 (two) times daily with a meal.   FUROSEMIDE (LASIX) 20 MG TABLET    Take 3 tablets (60 mg total) by mouth daily.   HYDRALAZINE (APRESOLINE) 25 MG TABLET    Take 25 mg by mouth 3 (three) times daily.   INSULIN GLARGINE (LANTUS) 100 UNIT/ML SOPN    Inject 17 Units into the skin at bedtime.   IPRATROPIUM-ALBUTEROL (DUONEB) 0.5-2.5 (3) MG/3ML SOLN    Take 3 mLs by nebulization every 6 (six) hours as needed.   ISOSORBIDE MONONITRATE (IMDUR) 30 MG 24 HR TABLET     Take 1 tablet (30 mg total) by mouth daily.   LEVOTHYROXINE (SYNTHROID, LEVOTHROID) 25 MCG TABLET    Take 25 mcg by mouth daily before breakfast.   NALOXEGOL OXALATE (MOVANTIK) 12.5 MG TABS TABLET    Take 1 tablet (12.5 mg total) by mouth daily.   NITROGLYCERIN (NITROSTAT) 0.4 MG SL TABLET    Place 0.4 mg under the tongue every 5 (five) minutes as needed for chest pain.   OXYCODONE 10 MG TABS    Take 1 tablet (10 mg total) by mouth every 6 (six) hours as needed for severe pain.   OXYGEN    Inhale into the lungs. 2lpm via [place] prn for shortness of breath or O2 sat <90%   PREGABALIN (LYRICA) 150 MG CAPSULE    Take 150 mg by mouth every 8 (eight) hours.   WARFARIN (COUMADIN) 1 MG TABLET    Take 7 mg by mouth daily.  Modified Medications   No medications on file  Discontinued Medications   AMBULATORY NON FORMULARY MEDICATION    Med Pass Sig: Give 120ml by mouth three times daily for supplement   PREGABALIN (LYRICA) 100 MG CAPSULE    Take 100 mg by mouth 3 (three) times daily.     Physical Exam: Vitals:   02/29/16 1048  BP: 135/75  Pulse: 70  Resp: 18  Temp: 99.1 F (37.3 C)  SpO2: 94%  Weight: 164 lb 3.2 oz (74.5 kg)  Height: 5\' 10"  (1.778 m)    Physical Exam  Constitutional: He is oriented to person, place, and time. He appears well-developed and well-nourished. No distress.  HENT:  Head: Normocephalic and atraumatic.  Mouth/Throat: Oropharynx is clear and moist. No oropharyngeal exudate.  Eyes: Conjunctivae and EOM are normal. Pupils are equal, round, and reactive to light.  Neck: Normal range of motion. Neck supple.  Cardiovascular: Normal rate, regular rhythm and normal heart sounds.   Diminished pedal pulse to left   Pulmonary/Chest: Effort normal and breath sounds normal.  Abdominal: Soft. Bowel sounds are normal.  Musculoskeletal: He exhibits no edema or tenderness.  Right AKA with well healed incision   Neurological: He is alert and oriented to person, place, and time.    Skin: Skin is warm and dry. He is not diaphoretic.  Psychiatric: He has a normal mood and affect.    Labs reviewed: Basic Metabolic Panel:  Recent Labs  16/01/9608/30/17 0305 01/13/16 1147 01/14/16 0528  02/04/16 02/16/16 02/23/16  NA 135 137 138  < >  132* 137 137  K 4.0 4.1 4.0  < > 4.9 4.4 4.9  CL 104 108 108  --   --   --   --   CO2 24 23 25   --   --   --   --   GLUCOSE 148* 113* 128*  --   --   --   --   BUN 13 14 14   < > 28* 30* 40*  CREATININE 1.53* 1.45* 1.48*  < > 1.6* 1.8* 1.5*  CALCIUM 8.3* 8.2* 8.4*  --   --   --   --   < > = values in this interval not displayed. Liver Function Tests:  Recent Labs  12/29/15 0530 12/31/15 0824 01/07/16 0732  AST 222* 50* 28  ALT 91* 50 19  ALKPHOS 167* 176* 165*  BILITOT 0.5 0.5 0.8  PROT 8.0 6.3* 6.9  ALBUMIN 1.6* 1.5* 1.5*   No results for input(s): LIPASE, AMYLASE in the last 8760 hours. No results for input(s): AMMONIA in the last 8760 hours. CBC:  Recent Labs  12/26/15 1908  01/12/16 0305 01/13/16 0538 01/14/16 0528 02/16/16  WBC 26.1*  < > 7.5 7.3 8.0 7.1  NEUTROABS 23.2*  --   --   --   --   --   HGB 12.2*  < > 9.1* 9.0* 9.2* 8.6*  HCT 36.4*  < > 29.5* 29.3* 30.1* 27*  MCV 89.4  < > 92.5 92.1 92.6  --   PLT 337  < > 242 248 268 158  < > = values in this interval not displayed. TSH: No results for input(s): TSH in the last 8760 hours. A1C: Lab Results  Component Value Date   HGBA1C 9.1 (H) 12/27/2015   Lipid Panel: No results for input(s): CHOL, HDL, LDLCALC, TRIG, CHOLHDL, LDLDIRECT in the last 8760 hours.  Assessment/Plan 1. Paroxysmal atrial fibrillation (HCC) Rate controlled, cont coumadin, (INR pending today), coreg for rate   2. Type 2 diabetes mellitus with diabetic peripheral angiopathy without gangrene, without long-term current use of insulin (HCC) A1c elevated in sept, will start monitoring CBGs BID   3. Dry gangrene (HCC) Stable, reports pain is stable.   4. Renal insufficiency BMP being  followed, will decrease lasix to 40 mg daily, swelling has improved at this time.   5. Peripheral neuropathy due to metabolic disorder conts on lyrica 150 mg TID   6. Anemia, unspecified type Not iron deficient on lab, taking iron supplements, no signs of acute blood loss, follow up CBC   7. hypothyroid  -will follow up TSH     Genee Rann K. Biagio Borg  [facility] [phone] 8 am - 5 pm) [phone] (after hours)

## 2016-03-03 LAB — TSH: TSH: 4.44 u[IU]/mL (ref 0.41–5.90)

## 2016-03-03 LAB — BASIC METABOLIC PANEL
BUN: 37 mg/dL — AB (ref 4–21)
Creatinine: 1.7 mg/dL — AB (ref 0.6–1.3)
GLUCOSE: 145 mg/dL
POTASSIUM: 5 mmol/L (ref 3.4–5.3)
Sodium: 139 mmol/L (ref 137–147)

## 2016-03-04 LAB — CBC AND DIFFERENTIAL
HEMATOCRIT: 30 % — AB (ref 41–53)
HEMOGLOBIN: 9.1 g/dL — AB (ref 13.5–17.5)
PLATELETS: 129 10*3/uL — AB (ref 150–399)
WBC: 4.9 10*3/mL

## 2016-03-04 LAB — POCT INR: INR: 2.1 — AB (ref 0.9–1.1)

## 2016-03-04 LAB — PROTIME-INR: PROTIME: 23 s — AB (ref 10.0–13.8)

## 2016-03-10 ENCOUNTER — Other Ambulatory Visit: Payer: Self-pay

## 2016-03-10 MED ORDER — OXYCODONE HCL 5 MG PO TABS: 5.0000 mg | ORAL_TABLET | Freq: Three times a day (TID) | ORAL | 0 refills | Status: DC | PRN

## 2016-03-10 NOTE — Telephone Encounter (Signed)
Faxed to [facility] Fax Number: [phone], Phone Number [phone]  

## 2016-03-11 ENCOUNTER — Ambulatory Visit (INDEPENDENT_AMBULATORY_CARE_PROVIDER_SITE_OTHER): Payer: Medicare Other | Admitting: Vascular Surgery

## 2016-03-11 ENCOUNTER — Encounter: Payer: Self-pay | Admitting: Vascular Surgery

## 2016-03-11 VITALS — BP 121/73 | HR 91 | Temp 98.0°F | Resp 18 | Ht 70.0 in | Wt 185.0 lb

## 2016-03-11 DIAGNOSIS — Z89611 Acquired absence of right leg above knee: Secondary | ICD-10-CM

## 2016-03-11 LAB — PROTIME-INR: PROTIME: 25.2 s — AB (ref 10.0–13.8)

## 2016-03-11 LAB — POCT INR: INR: 2.3 — AB (ref 0.9–1.1)

## 2016-03-11 NOTE — Progress Notes (Signed)
Patient name: Ronald Simpson MRN: [id number] DOB: 05/29/1945 Sex: male  REASON FOR VISIT: Follow-up right above-knee amputation and gangrene left second and fourth toes  HPI: Ronald Simpson is a 70 y.o. male here for follow-up. 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 Eventually underwent right above-knee amputation without incident. Have a dry gangrenous changes to his left second and fourth toe. When arteriography revealing flow throughout the SFA popliteal and single-vessel runoff via peroneal  Current Outpatient Prescriptions  Medication Sig Dispense Refill  . acetaminophen (TYLENOL) 325 MG tablet Take 650 mg by mouth every 6 (six) hours as needed for mild pain, moderate pain or fever.    . AMBULATORY NON FORMULARY MEDICATION Magic Cup Sig: One twice daily    . amitriptyline (ELAVIL) 25 MG tablet Take 25 mg by mouth at bedtime.    Marland Kitchen. atorvastatin (LIPITOR) 40 MG tablet Take 40 mg by mouth daily.    . carvedilol (COREG) 6.25 MG tablet Take 6.25 mg by mouth 2 (two) times daily with a meal.    . docusate sodium (COLACE) 100 MG capsule Take 1 capsule (100 mg total) by mouth 2 (two) times daily. 10 capsule 0  . ferrous sulfate 325 (65 FE) MG tablet Take 325 mg by mouth 2 (two) times daily with a meal.    . furosemide (LASIX) 20 MG tablet Take 3 tablets (60 mg total) by mouth daily. 30 tablet 0  . hydrALAZINE (APRESOLINE) 25 MG tablet Take 25 mg by mouth 3 (three) times daily.    . insulin glargine (LANTUS) 100 unit/mL SOPN Inject 17 Units into the skin at bedtime.    Marland Kitchen. ipratropium-albuterol (DUONEB) 0.5-2.5 (3) MG/3ML SOLN Take 3 mLs by nebulization every 6 (six) hours as needed.    . isosorbide mononitrate (IMDUR) 30 MG 24 hr tablet Take 1 tablet (30 mg total) by mouth daily. 30 tablet 1  . levothyroxine (SYNTHROID, LEVOTHROID) 25 MCG tablet Take 25 mcg by mouth daily before breakfast.    . nitroGLYCERIN  (NITROSTAT) 0.4 MG SL tablet Place 0.4 mg under the tongue every 5 (five) minutes as needed for chest pain.    Marland Kitchen. oxyCODONE (ROXICODONE) 5 MG immediate release tablet Take 1 tablet (5 mg total) by mouth every 8 (eight) hours as needed for severe pain. 90 tablet 0  . OXYGEN Inhale into the lungs. 2lpm via [place] prn for shortness of breath or O2 sat <90%    . pregabalin (LYRICA) 150 MG capsule Take 150 mg by mouth every 8 (eight) hours.    . traMADol (ULTRAM) 50 MG tablet Take 50 mg by mouth every 6 (six) hours as needed.    . warfarin (COUMADIN) 1 MG tablet Take 7 mg by mouth daily.    . naloxegol oxalate (MOVANTIK) 12.5 MG TABS tablet Take 1 tablet (12.5 mg total) by mouth daily. (Patient not taking: Reported on 03/11/2016) 30 tablet 0  . oxyCODONE 10 MG TABS Take 1 tablet (10 mg total) by mouth every 6 (six) hours as needed for severe pain. (Patient not taking: Reported on 03/11/2016) 15 tablet 0   No current facility-administered medications for this visit.      PHYSICAL EXAM: Vitals:   03/11/16 1217  BP: 121/73  Pulse: 91  Resp: 18  Temp: 98 F (36.7 C)  TempSrc: Oral  SpO2: 98%  Weight: 185 lb (83.9 kg)  Height: 5\' 10"  (1.778 m)    GENERAL: The patient is a well-nourished male, in  no acute distress. The vital signs are documented above. Looks good today. Above-knee amputation completely healed. On the left he does have a palpable popliteal pulse. He has dry gangrenous changes on the medial tip of his second toe. Has a complete take dry gangrenous changes of the distal half of his fourth toe with Savina Olshefski autoamputation  MEDICAL ISSUES: Stable overall. Instructed on continued local wound care. He is currently nursing facility and wishes to be discharged. I explained that this would be up to the caregivers at the nursing facility. Explained that he will in all likelihood of patient office second and fourth toe. Explained that local care with keeping this area clean and dry after bathing it  routinely. We'll see him again in 2 months for continued follow-up   Larina Earthlyodd F. Braxxton Stoudt, MD [facility] Tel [phone] Pager [phone]

## 2016-03-12 ENCOUNTER — Other Ambulatory Visit: Payer: Self-pay | Admitting: *Deleted

## 2016-03-12 MED ORDER — OXYCODONE HCL 5 MG PO TABS: 5.0000 mg | ORAL_TABLET | Freq: Three times a day (TID) | ORAL | 0 refills | Status: DC | PRN

## 2016-03-12 NOTE — Telephone Encounter (Signed)
[facility] [phone] Fax: [phone]  

## 2016-03-19 LAB — PROTIME-INR: PROTIME: 34 s — AB (ref 10.0–13.8)

## 2016-03-19 LAB — POCT INR: INR: 3.4 — AB (ref 0.9–1.1)

## 2016-03-25 LAB — POCT INR: INR: 2.8 — AB (ref 0.9–1.1)

## 2016-03-25 LAB — PROTIME-INR: PROTIME: 29.1 s — AB (ref 10.0–13.8)

## 2016-03-26 ENCOUNTER — Encounter: Payer: Self-pay | Admitting: Nurse Practitioner

## 2016-03-26 ENCOUNTER — Non-Acute Institutional Stay (SKILLED_NURSING_FACILITY): Payer: Medicare Other | Admitting: Nurse Practitioner

## 2016-03-26 DIAGNOSIS — Z794 Long term (current) use of insulin: Secondary | ICD-10-CM

## 2016-03-26 DIAGNOSIS — G894 Chronic pain syndrome: Secondary | ICD-10-CM

## 2016-03-26 DIAGNOSIS — G990 Autonomic neuropathy in diseases classified elsewhere: Secondary | ICD-10-CM | POA: Diagnosis not present

## 2016-03-26 DIAGNOSIS — J0120 Acute ethmoidal sinusitis, unspecified: Secondary | ICD-10-CM | POA: Diagnosis not present

## 2016-03-26 DIAGNOSIS — I480 Paroxysmal atrial fibrillation: Secondary | ICD-10-CM | POA: Diagnosis not present

## 2016-03-26 DIAGNOSIS — E889 Metabolic disorder, unspecified: Secondary | ICD-10-CM

## 2016-03-26 DIAGNOSIS — E1152 Type 2 diabetes mellitus with diabetic peripheral angiopathy with gangrene: Secondary | ICD-10-CM | POA: Diagnosis not present

## 2016-03-26 DIAGNOSIS — G63 Polyneuropathy in diseases classified elsewhere: Secondary | ICD-10-CM

## 2016-03-26 NOTE — Progress Notes (Signed)
Nursing Home Location:  [facility]  Place of Service: SNF (31)  PCP: Tilden DomeSCHNEIDER,RICHARD, MD  No Known Allergies  Chief Complaint  Patient presents with  . Medical Management of Chronic Issues    Routine Visit    HPI:  Patient is a 70 y.o. male seen today at [facility] for routine follow up. Pt with hx of CVA, hep b and C, PVD, AKA, CAD, DM. Pt reports he has been feeling poorly the last week. Increase pressure to his sinuses with headache. Dry nose. Denies fevers or chills.  No constipation or diarrhea.  Urine is dark, urinal at bedside.  No increase in pain. Staff reports he is not using pain medication as routine at this time.   Review of Systems:  Review of Systems  Constitutional: Positive for fatigue. Negative for chills and fever.  HENT: Positive for congestion, postnasal drip and sinus pressure. Negative for nosebleeds, sore throat and tinnitus.   Respiratory: Negative for cough and shortness of breath.   Cardiovascular: Negative for chest pain, palpitations and leg swelling.  Gastrointestinal: Negative for abdominal pain, constipation and diarrhea.  Genitourinary: Negative for dysuria, frequency and urgency.  Musculoskeletal: Positive for arthralgias. Negative for back pain and myalgias.  Skin: Positive for wound.       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 CAD (coronary artery disease)    01/15/16 he states he has had a heart attack  . CVA (cerebral infarction)   . DM (diabetes mellitus), type 2 with peripheral vascular complications (HCC)   . History of hepatitis B   . History of hepatitis C   . Peripheral vascular disease [facility](HCC)    Past Surgical History:  Procedure Laterality Date  . AMPUTATION Right 01/07/2016   Procedure: AMPUTATION ABOVE KNEE;  Surgeon: Larina Earthlyodd F Early, MD;  [facility];  Service:  Vascular;  Laterality: Right;  . BELOW KNEE LEG AMPUTATION Right   . CORONARY ARTERY BYPASS GRAFT  2005  . PERIPHERAL VASCULAR CATHETERIZATION N/A 01/11/2016   Procedure: Lower Extremity Angiography;  Surgeon: Nada LibmanVance W Brabham, MD;  [facility];  Service: Cardiovascular;  Laterality: N/A;  . STERNOTOMY     Social History:   reports that he has been smoking.  He has been smoking about 0.50 packs per day. He has never used smokeless tobacco. He reports that he drinks about 8.4 oz of alcohol per week . He reports that he uses drugs, including Cocaine and Marijuana.  Family History  Problem Relation Age of Onset  . Hypertension Other   . Diabetes Other   . Mental illness Other   . Lupus Other   . Diabetes Father   . Cancer Neg Hx   . Heart disease Neg Hx   . Stroke Neg Hx     Medications: Patient's Medications  New Prescriptions   No medications on file  Previous Medications   ACETAMINOPHEN (TYLENOL) 325 MG TABLET    Take 650 mg by mouth every 6 (six) hours as needed for mild pain, moderate pain or fever.   AMITRIPTYLINE (ELAVIL) 25 MG TABLET    Take 25 mg by mouth at bedtime.   ATORVASTATIN (LIPITOR) 40 MG TABLET    Take 40 mg by mouth daily.   CARVEDILOL (COREG) 6.25 MG TABLET  Take 6.25 mg by mouth 2 (two) times daily with a meal.   DOCUSATE SODIUM (COLACE) 100 MG CAPSULE    Take 1 capsule (100 mg total) by mouth 2 (two) times daily.   FERROUS SULFATE 325 (65 FE) MG TABLET    Take 325 mg by mouth 2 (two) times daily with a meal.   FUROSEMIDE (LASIX) 40 MG TABLET    Take 40 mg by mouth.   GABAPENTIN (NEURONTIN) 100 MG CAPSULE    Take 100 mg by mouth 3 (three) times daily.   HYDRALAZINE (APRESOLINE) 25 MG TABLET    Take 25 mg by mouth 3 (three) times daily.   INSULIN GLARGINE (LANTUS) 100 UNIT/ML SOPN    Inject 17 Units into the skin at bedtime.   IPRATROPIUM-ALBUTEROL (DUONEB) 0.5-2.5 (3) MG/3ML SOLN    Take 3 mLs by nebulization every 6 (six) hours as needed.    ISOSORBIDE MONONITRATE (IMDUR) 30 MG 24 HR TABLET    Take 1 tablet (30 mg total) by mouth daily.   LEVOTHYROXINE (SYNTHROID, LEVOTHROID) 25 MCG TABLET    Take 25 mcg by mouth daily before breakfast.   NICOTINE (NICODERM CQ - DOSED IN MG/24 HOURS) 14 MG/24HR PATCH    Place 14 mg onto the skin daily.   NITROGLYCERIN (NITROSTAT) 0.4 MG SL TABLET    Place 0.4 mg under the tongue every 5 (five) minutes as needed for chest pain.   OXYCODONE (ROXICODONE) 5 MG IMMEDIATE RELEASE TABLET    Take 1 tablet (5 mg total) by mouth every 8 (eight) hours as needed for severe pain.   OXYGEN    Inhale into the lungs. 2lpm via [place] prn for shortness of breath or O2 sat <90%   PREGABALIN (LYRICA) 100 MG CAPSULE    Take 100 mg by mouth every 8 (eight) hours.   TRAMADOL (ULTRAM) 50 MG TABLET    Take 50 mg by mouth every 8 (eight) hours as needed.    WARFARIN (COUMADIN) 1 MG TABLET    Take 5 mg by mouth daily.   Modified Medications   No medications on file  Discontinued Medications   AMBULATORY NON FORMULARY MEDICATION    Magic Cup Sig: One twice daily   FUROSEMIDE (LASIX) 20 MG TABLET    Take 3 tablets (60 mg total) by mouth daily.   NALOXEGOL OXALATE (MOVANTIK) 12.5 MG TABS TABLET    Take 1 tablet (12.5 mg total) by mouth daily.   OXYCODONE 10 MG TABS    Take 1 tablet (10 mg total) by mouth every 6 (six) hours as needed for severe pain.   PREGABALIN (LYRICA) 150 MG CAPSULE    Take 150 mg by mouth every 8 (eight) hours.     Physical Exam: Vitals:   03/26/16 1127  BP: 126/84  Pulse: 84  Resp: 20  Temp: 98.5 F (36.9 C)  SpO2: 99%  Weight: 184 lb (83.5 kg)  Height: 5\' 10"  (1.778 m)    Physical Exam  Constitutional: He is oriented to person, place, and time. He appears well-developed and well-nourished. No distress.  HENT:  Head: Normocephalic and atraumatic.  Mouth/Throat: Oropharynx is clear and moist. No oropharyngeal exudate.  Eyes: Conjunctivae and EOM are normal. Pupils are equal, round, and  reactive to light.  Neck: Normal range of motion. Neck supple.  Cardiovascular: Normal rate, regular rhythm and normal heart sounds.   Diminished pedal pulse to left   Pulmonary/Chest: Effort normal and breath sounds normal.  Abdominal: Soft. Bowel sounds  are normal.  Musculoskeletal: He exhibits no edema or tenderness.  Right AKA with well healed incision   Neurological: He is alert and oriented to person, place, and time.  Skin: Skin is warm and dry. He is not diaphoretic.  Psychiatric: He has a normal mood and affect.    Labs reviewed: Basic Metabolic Panel:  Recent Labs  16/01/9608/30/17 0305 01/13/16 1147 01/14/16 0528  02/16/16 02/23/16 03/03/16  NA 135 137 138  < > 137 137 139  K 4.0 4.1 4.0  < > 4.4 4.9 5.0  CL 104 108 108  --   --   --   --   CO2 24 23 25   --   --   --   --   GLUCOSE 148* 113* 128*  --   --   --   --   BUN 13 14 14   < > 30* 40* 37*  CREATININE 1.53* 1.45* 1.48*  < > 1.8* 1.5* 1.7*  CALCIUM 8.3* 8.2* 8.4*  --   --   --   --   < > = values in this interval not displayed. Liver Function Tests:  Recent Labs  12/29/15 0530 12/31/15 0824 01/07/16 0732  AST 222* 50* 28  ALT 91* 50 19  ALKPHOS 167* 176* 165*  BILITOT 0.5 0.5 0.8  PROT 8.0 6.3* 6.9  ALBUMIN 1.6* 1.5* 1.5*   No results for input(s): LIPASE, AMYLASE in the last 8760 hours. No results for input(s): AMMONIA in the last 8760 hours. CBC:  Recent Labs  12/26/15 1908  01/12/16 0305 01/13/16 0538 01/14/16 0528 02/16/16 03/04/16  WBC 26.1*  < > 7.5 7.3 8.0 7.1 4.9  NEUTROABS 23.2*  --   --   --   --   --   --   HGB 12.2*  < > 9.1* 9.0* 9.2* 8.6* 9.1*  HCT 36.4*  < > 29.5* 29.3* 30.1* 27* 30*  MCV 89.4  < > 92.5 92.1 92.6  --   --   PLT 337  < > 242 248 268 158 129*  < > = values in this interval not displayed. TSH:  Recent Labs  03/03/16  TSH 4.44   A1C: Lab Results  Component Value Date   HGBA1C 9.1 (H) 12/27/2015   Lipid Panel: No results for input(s): CHOL, HDL, LDLCALC,  TRIG, CHOLHDL, LDLDIRECT in the last 8760 hours.  Assessment/Plan 1. Acute ethmoidal sinusitis, recurrence not specified augmentina 875-125 mg BID for 7 days due to sinusitis, may use plain saline to bilateral nares PRN for dryness To increase hydration.   2. Paroxysmal atrial fibrillation (HCC) Rate controlled. Cont on coumadin, last INR 2.6, follow up INR in 2 days   3. 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 conts on lyrica 100 mg TID  4. Type 2 diabetes mellitus with diabetic peripheral angiopathy with gangrene, with long-term current use of insulin (HCC) Stable, will need follow up A1c  5. Chronic pain syndrome Pain controlled at this time. Attempts made to decrease pain regimen, will decrease Oxycodone to 5 mg q 12 hour PRN severe pain     Jessica K. Biagio BorgEubanks, AGNP  [facility] [phone](Monday-Friday 8 am - 5 pm) [phone] (after hours)

## 2016-03-31 LAB — POCT INR: INR: 11.8 — AB (ref 0.9–1.1)

## 2016-03-31 LAB — PROTIME-INR: Protime: 92.8 seconds — AB (ref 10.0–13.8)

## 2016-04-02 ENCOUNTER — Non-Acute Institutional Stay (SKILLED_NURSING_FACILITY): Payer: Medicare Other | Admitting: Nurse Practitioner

## 2016-04-02 ENCOUNTER — Encounter: Payer: Self-pay | Admitting: Nurse Practitioner

## 2016-04-02 ENCOUNTER — Encounter (HOSPITAL_COMMUNITY): Payer: Self-pay | Admitting: Emergency Medicine

## 2016-04-02 ENCOUNTER — Inpatient Hospital Stay (HOSPITAL_COMMUNITY)
Admission: EM | Admit: 2016-04-02 | Discharge: 2016-04-08 | DRG: 256 | Disposition: A | Discharge status: Skilled Nursing Facility | Payer: Medicare Other | Attending: Internal Medicine | Admitting: Internal Medicine

## 2016-04-02 ENCOUNTER — Emergency Department (HOSPITAL_COMMUNITY): Payer: Medicare Other

## 2016-04-02 DIAGNOSIS — R4182 Altered mental status, unspecified: Secondary | ICD-10-CM

## 2016-04-02 DIAGNOSIS — L03116 Cellulitis of left lower limb: Secondary | ICD-10-CM

## 2016-04-02 DIAGNOSIS — T40601A Poisoning by unspecified narcotics, accidental (unintentional), initial encounter: Secondary | ICD-10-CM

## 2016-04-02 DIAGNOSIS — B1910 Unspecified viral hepatitis B without hepatic coma: Secondary | ICD-10-CM | POA: Diagnosis present

## 2016-04-02 DIAGNOSIS — I480 Paroxysmal atrial fibrillation: Secondary | ICD-10-CM | POA: Diagnosis present

## 2016-04-02 DIAGNOSIS — R791 Abnormal coagulation profile: Secondary | ICD-10-CM | POA: Diagnosis not present

## 2016-04-02 DIAGNOSIS — N179 Acute kidney failure, unspecified: Secondary | ICD-10-CM | POA: Diagnosis present

## 2016-04-02 DIAGNOSIS — I96 Gangrene, not elsewhere classified: Secondary | ICD-10-CM

## 2016-04-02 DIAGNOSIS — Z79899 Other long term (current) drug therapy: Secondary | ICD-10-CM

## 2016-04-02 DIAGNOSIS — Z23 Encounter for immunization: Secondary | ICD-10-CM

## 2016-04-02 DIAGNOSIS — E1142 Type 2 diabetes mellitus with diabetic polyneuropathy: Secondary | ICD-10-CM | POA: Diagnosis present

## 2016-04-02 DIAGNOSIS — G894 Chronic pain syndrome: Secondary | ICD-10-CM | POA: Diagnosis not present

## 2016-04-02 DIAGNOSIS — I10 Essential (primary) hypertension: Secondary | ICD-10-CM | POA: Diagnosis present

## 2016-04-02 DIAGNOSIS — E1152 Type 2 diabetes mellitus with diabetic peripheral angiopathy with gangrene: Secondary | ICD-10-CM | POA: Diagnosis not present

## 2016-04-02 DIAGNOSIS — Z7901 Long term (current) use of anticoagulants: Secondary | ICD-10-CM

## 2016-04-02 DIAGNOSIS — I129 Hypertensive chronic kidney disease with stage 1 through stage 4 chronic kidney disease, or unspecified chronic kidney disease: Secondary | ICD-10-CM | POA: Diagnosis present

## 2016-04-02 DIAGNOSIS — Z794 Long term (current) use of insulin: Secondary | ICD-10-CM

## 2016-04-02 DIAGNOSIS — Z8673 Personal history of transient ischemic attack (TIA), and cerebral infarction without residual deficits: Secondary | ICD-10-CM

## 2016-04-02 DIAGNOSIS — N5089 Other specified disorders of the male genital organs: Secondary | ICD-10-CM

## 2016-04-02 DIAGNOSIS — F17210 Nicotine dependence, cigarettes, uncomplicated: Secondary | ICD-10-CM | POA: Diagnosis present

## 2016-04-02 DIAGNOSIS — I252 Old myocardial infarction: Secondary | ICD-10-CM

## 2016-04-02 DIAGNOSIS — N4889 Other specified disorders of penis: Secondary | ICD-10-CM | POA: Diagnosis not present

## 2016-04-02 DIAGNOSIS — B1920 Unspecified viral hepatitis C without hepatic coma: Secondary | ICD-10-CM | POA: Diagnosis present

## 2016-04-02 DIAGNOSIS — I2510 Atherosclerotic heart disease of native coronary artery without angina pectoris: Secondary | ICD-10-CM | POA: Diagnosis present

## 2016-04-02 DIAGNOSIS — Z951 Presence of aortocoronary bypass graft: Secondary | ICD-10-CM

## 2016-04-02 DIAGNOSIS — E1122 Type 2 diabetes mellitus with diabetic chronic kidney disease: Secondary | ICD-10-CM | POA: Diagnosis present

## 2016-04-02 DIAGNOSIS — Z8249 Family history of ischemic heart disease and other diseases of the circulatory system: Secondary | ICD-10-CM

## 2016-04-02 DIAGNOSIS — E8809 Other disorders of plasma-protein metabolism, not elsewhere classified: Secondary | ICD-10-CM | POA: Diagnosis not present

## 2016-04-02 DIAGNOSIS — N3090 Cystitis, unspecified without hematuria: Secondary | ICD-10-CM

## 2016-04-02 DIAGNOSIS — G629 Polyneuropathy, unspecified: Secondary | ICD-10-CM

## 2016-04-02 DIAGNOSIS — E1151 Type 2 diabetes mellitus with diabetic peripheral angiopathy without gangrene: Secondary | ICD-10-CM | POA: Diagnosis present

## 2016-04-02 DIAGNOSIS — Z89511 Acquired absence of right leg below knee: Secondary | ICD-10-CM

## 2016-04-02 DIAGNOSIS — I739 Peripheral vascular disease, unspecified: Secondary | ICD-10-CM | POA: Diagnosis present

## 2016-04-02 DIAGNOSIS — E785 Hyperlipidemia, unspecified: Secondary | ICD-10-CM | POA: Diagnosis present

## 2016-04-02 DIAGNOSIS — E039 Hypothyroidism, unspecified: Secondary | ICD-10-CM | POA: Diagnosis present

## 2016-04-02 DIAGNOSIS — E1169 Type 2 diabetes mellitus with other specified complication: Secondary | ICD-10-CM | POA: Diagnosis present

## 2016-04-02 DIAGNOSIS — F1210 Cannabis abuse, uncomplicated: Secondary | ICD-10-CM | POA: Diagnosis present

## 2016-04-02 DIAGNOSIS — R400 Somnolence: Secondary | ICD-10-CM | POA: Diagnosis present

## 2016-04-02 DIAGNOSIS — F1410 Cocaine abuse, uncomplicated: Secondary | ICD-10-CM | POA: Diagnosis present

## 2016-04-02 DIAGNOSIS — N189 Chronic kidney disease, unspecified: Secondary | ICD-10-CM | POA: Diagnosis present

## 2016-04-02 DIAGNOSIS — Z89611 Acquired absence of right leg above knee: Secondary | ICD-10-CM

## 2016-04-02 LAB — CBC WITH DIFFERENTIAL/PLATELET
BASOS ABS: 0 10*3/uL (ref 0.0–0.1)
BASOS PCT: 0 %
Eosinophils Absolute: 0.1 10*3/uL (ref 0.0–0.7)
Eosinophils Relative: 1 %
HEMATOCRIT: 28.9 % — AB (ref 39.0–52.0)
Hemoglobin: 9.1 g/dL — ABNORMAL LOW (ref 13.0–17.0)
Lymphocytes Relative: 11 %
Lymphs Abs: 1.4 10*3/uL (ref 0.7–4.0)
MCH: 29.5 pg (ref 26.0–34.0)
MCHC: 31.5 g/dL (ref 30.0–36.0)
MCV: 93.8 fL (ref 78.0–100.0)
MONO ABS: 1.7 10*3/uL — AB (ref 0.1–1.0)
Monocytes Relative: 13 %
NEUTROS ABS: 9.7 10*3/uL — AB (ref 1.7–7.7)
NEUTROS PCT: 75 %
PLATELETS: 185 10*3/uL (ref 150–400)
RBC: 3.08 MIL/uL — ABNORMAL LOW (ref 4.22–5.81)
RDW: 15.7 % — AB (ref 11.5–15.5)
WBC: 12.9 10*3/uL — ABNORMAL HIGH (ref 4.0–10.5)

## 2016-04-02 LAB — URINALYSIS, ROUTINE W REFLEX MICROSCOPIC
BILIRUBIN URINE: NEGATIVE
GLUCOSE, UA: NEGATIVE mg/dL
HGB URINE DIPSTICK: NEGATIVE
Ketones, ur: NEGATIVE mg/dL
NITRITE: NEGATIVE
PH: 5 (ref 5.0–8.0)
Protein, ur: 100 mg/dL — AB
SPECIFIC GRAVITY, URINE: 1.017 (ref 1.005–1.030)
Squamous Epithelial / LPF: NONE SEEN

## 2016-04-02 LAB — RAPID URINE DRUG SCREEN, HOSP PERFORMED
Amphetamines: NOT DETECTED
BARBITURATES: NOT DETECTED
Benzodiazepines: NOT DETECTED
Cocaine: NOT DETECTED
Opiates: NOT DETECTED
Tetrahydrocannabinol: NOT DETECTED

## 2016-04-02 LAB — COMPREHENSIVE METABOLIC PANEL
ALK PHOS: 119 U/L (ref 38–126)
ALT: 12 U/L — ABNORMAL LOW (ref 17–63)
ANION GAP: 6 (ref 5–15)
AST: 16 U/L (ref 15–41)
Albumin: 2.7 g/dL — ABNORMAL LOW (ref 3.5–5.0)
BILIRUBIN TOTAL: 1 mg/dL (ref 0.3–1.2)
BUN: 47 mg/dL — ABNORMAL HIGH (ref 6–20)
CALCIUM: 8.5 mg/dL — AB (ref 8.9–10.3)
CO2: 22 mmol/L (ref 22–32)
Chloride: 110 mmol/L (ref 101–111)
Creatinine, Ser: 1.84 mg/dL — ABNORMAL HIGH (ref 0.61–1.24)
GFR calc non Af Amer: 36 mL/min — ABNORMAL LOW (ref >60)
GFR, EST AFRICAN AMERICAN: 42 mL/min — AB (ref >60)
GLUCOSE: 158 mg/dL — AB (ref 65–99)
Potassium: 4.7 mmol/L (ref 3.5–5.1)
Sodium: 138 mmol/L (ref 135–145)
TOTAL PROTEIN: 6.8 g/dL (ref 6.5–8.1)

## 2016-04-02 LAB — BASIC METABOLIC PANEL
Anion gap: 7 (ref 5–15)
BUN: 45 mg/dL — AB (ref 6–20)
CALCIUM: 8.7 mg/dL — AB (ref 8.9–10.3)
CO2: 22 mmol/L (ref 22–32)
CREATININE: 1.69 mg/dL — AB (ref 0.61–1.24)
Chloride: 111 mmol/L (ref 101–111)
GFR calc Af Amer: 46 mL/min — ABNORMAL LOW (ref >60)
GFR, EST NON AFRICAN AMERICAN: 40 mL/min — AB (ref >60)
GLUCOSE: 154 mg/dL — AB (ref 65–99)
Potassium: 4.7 mmol/L (ref 3.5–5.1)
Sodium: 140 mmol/L (ref 135–145)

## 2016-04-02 LAB — I-STAT VENOUS BLOOD GAS, ED
Acid-base deficit: 4 mmol/L — ABNORMAL HIGH (ref 0.0–2.0)
BICARBONATE: 20.6 mmol/L (ref 20.0–28.0)
O2 Saturation: 80 %
PCO2 VEN: 35.2 mmHg — AB (ref 44.0–60.0)
PH VEN: 7.376 (ref 7.250–7.430)
PO2 VEN: 45 mmHg (ref 32.0–45.0)
TCO2: 22 mmol/L (ref 0–100)

## 2016-04-02 LAB — I-STAT TROPONIN, ED: Troponin i, poc: 0.03 ng/mL (ref 0.00–0.08)

## 2016-04-02 LAB — GLUCOSE, CAPILLARY: GLUCOSE-CAPILLARY: 174 mg/dL — AB (ref 65–99)

## 2016-04-02 LAB — I-STAT CG4 LACTIC ACID, ED: Lactic Acid, Venous: 0.66 mmol/L (ref 0.5–1.9)

## 2016-04-02 LAB — AMMONIA: Ammonia: 53 umol/L — ABNORMAL HIGH (ref 9–35)

## 2016-04-02 MED ORDER — IPRATROPIUM-ALBUTEROL 0.5-2.5 (3) MG/3ML IN SOLN
3.0000 mL | Freq: Four times a day (QID) | RESPIRATORY_TRACT | Status: DC | PRN
  Filled 2016-04-02 (×2): qty 3

## 2016-04-02 MED ORDER — NICOTINE 14 MG/24HR TD PT24
14.0000 mg | MEDICATED_PATCH | Freq: Every day | TRANSDERMAL | Status: DC
  Administered 2016-04-03 – 2016-04-08 (×6): 14 mg via TRANSDERMAL
  Filled 2016-04-02 (×6): qty 1

## 2016-04-02 MED ORDER — WHITE PETROLATUM GEL
Status: AC
  Administered 2016-04-02: 22:00:00
  Filled 2016-04-02: qty 1

## 2016-04-02 MED ORDER — SODIUM CHLORIDE 0.9 % IV SOLN
1000.0000 mL | INTRAVENOUS | Status: DC
  Administered 2016-04-02: 1000 mL via INTRAVENOUS

## 2016-04-02 MED ORDER — ATORVASTATIN CALCIUM 40 MG PO TABS
40.0000 mg | ORAL_TABLET | Freq: Every day | ORAL | Status: DC
  Administered 2016-04-02 – 2016-04-08 (×7): 40 mg via ORAL
  Filled 2016-04-02 (×7): qty 1

## 2016-04-02 MED ORDER — INSULIN GLARGINE 100 UNIT/ML ~~LOC~~ SOLN
14.0000 [IU] | Freq: Every day | SUBCUTANEOUS | Status: DC
  Administered 2016-04-02 – 2016-04-07 (×6): 14 [IU] via SUBCUTANEOUS
  Filled 2016-04-02 (×7): qty 0.14

## 2016-04-02 MED ORDER — LEVOTHYROXINE SODIUM 25 MCG PO TABS
25.0000 ug | ORAL_TABLET | Freq: Every day | ORAL | Status: DC
  Administered 2016-04-03 – 2016-04-08 (×6): 25 ug via ORAL
  Filled 2016-04-02 (×6): qty 1

## 2016-04-02 MED ORDER — ACETAMINOPHEN 325 MG PO TABS
650.0000 mg | ORAL_TABLET | Freq: Four times a day (QID) | ORAL | Status: DC | PRN
  Administered 2016-04-07: 650 mg via ORAL
  Filled 2016-04-02: qty 2

## 2016-04-02 MED ORDER — HYDRALAZINE HCL 25 MG PO TABS
25.0000 mg | ORAL_TABLET | Freq: Three times a day (TID) | ORAL | Status: DC
  Administered 2016-04-02 – 2016-04-08 (×19): 25 mg via ORAL
  Filled 2016-04-02 (×19): qty 1

## 2016-04-02 MED ORDER — INSULIN ASPART 100 UNIT/ML ~~LOC~~ SOLN
0.0000 [IU] | Freq: Three times a day (TID) | SUBCUTANEOUS | Status: DC
  Administered 2016-04-03: 2 [IU] via SUBCUTANEOUS
  Administered 2016-04-03: 5 [IU] via SUBCUTANEOUS
  Administered 2016-04-03 – 2016-04-05 (×4): 2 [IU] via SUBCUTANEOUS
  Administered 2016-04-05: 1 [IU] via SUBCUTANEOUS
  Administered 2016-04-05: 3 [IU] via SUBCUTANEOUS
  Administered 2016-04-06 (×2): 2 [IU] via SUBCUTANEOUS
  Administered 2016-04-07 (×2): 1 [IU] via SUBCUTANEOUS

## 2016-04-02 MED ORDER — NALOXONE HCL 2 MG/2ML IJ SOSY
1.0000 mg | PREFILLED_SYRINGE | Freq: Once | INTRAMUSCULAR | Status: AC
  Administered 2016-04-02: 1 mg via INTRAVENOUS
  Filled 2016-04-02: qty 2

## 2016-04-02 MED ORDER — TRAMADOL HCL 50 MG PO TABS: 50.0000 mg | ORAL_TABLET | Freq: Three times a day (TID) | ORAL | Status: DC | PRN

## 2016-04-02 MED ORDER — CARVEDILOL 6.25 MG PO TABS
6.2500 mg | ORAL_TABLET | Freq: Two times a day (BID) | ORAL | Status: DC
  Administered 2016-04-02 – 2016-04-08 (×13): 6.25 mg via ORAL
  Filled 2016-04-02 (×13): qty 1

## 2016-04-02 MED ORDER — NALOXONE HCL 0.4 MG/ML IJ SOLN
0.4000 mg | Freq: Once | INTRAMUSCULAR | Status: AC
  Administered 2016-04-02: 0.4 mg via INTRAVENOUS
  Filled 2016-04-02: qty 1

## 2016-04-02 MED ORDER — INSULIN ASPART 100 UNIT/ML ~~LOC~~ SOLN: 0.0000 [IU] | Freq: Every day | SUBCUTANEOUS | Status: DC

## 2016-04-02 MED ORDER — VITAMIN K1 10 MG/ML IJ SOLN
10.0000 mg | Freq: Once | INTRAVENOUS | Status: AC
  Administered 2016-04-02: 10 mg via INTRAVENOUS
  Filled 2016-04-02: qty 1

## 2016-04-02 NOTE — ED Triage Notes (Signed)
Pt here from Nursing home with c/o aloc ,according to EMS and nursing home pt became altered 2 days ago , pt will arouse to painful stimuli,

## 2016-04-02 NOTE — Consult Note (Signed)
[facility] [place]  Patient name: Ronald Simpson MRN: [id number] DOB: Feb 06, 1946 Sex: male  REASON FOR CONSULT: gangrenous left toes, consult is from EDP  HPI: Ronald Simpson is a 70 y.o. male, who presents from his SNF with altered mental status and supratherpeutic INR. 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 Arbie Cookey. He has been followed by Dr. Arbie Cookey for dry gangrene at the tips of his left second and fourth toes. He was seen recently on 03/11/16 by Dr. Arbie Cookey. At that time, local wound care was recommended as his gangrenous toes were auto amputating.   The patient reports that he was receiving too much pain medication at the SNF.   He is on coumadin for atrial fibrillation. He has type two diabetes.   Past Medical History:  Diagnosis Date  . CAD (coronary artery disease)    01/15/16 he states he has had a heart attack  . CVA (cerebral infarction)   . DM (diabetes mellitus), type 2 with peripheral vascular complications (HCC)   . History of hepatitis B   . History of hepatitis C   . Peripheral vascular disease (HCC)     Family History  Problem Relation Age of Onset  . Hypertension Other   . Diabetes Other   . Mental illness Other   . Lupus Other   . Diabetes Father   . Cancer Neg Hx   . Heart disease Neg Hx   . Stroke Neg Hx     SOCIAL HISTORY: Social History   Social History  . Marital status: Single    Spouse name: N/A  . Number of children: N/A  . Years of education: N/A   Occupational History  . Not on file.   Social History Main Topics  . Smoking status: Former Smoker    Packs/day: 0.50  . Smokeless tobacco: Never Used     Comment: On NicoDerm patch at SNF  . Alcohol use 8.4 oz/week    14 Cans of beer per week     Comment: Averages 2 beers per day  . Drug use:     Types: Cocaine, Marijuana     Comment: marijuana use daily, rare cocain use  . Sexual activity: Not Currently    Other Topics Concern  . Not on file   Social History Narrative  . No narrative on file    No Known Allergies  Current Facility-Administered Medications  Medication Dose Route Frequency Provider Last Rate Last Dose  . 0.9 %  sodium chloride infusion  1,000 mL Intravenous Continuous Nira Conn, MD 125 mL/hr at 04/02/16 1052 1,000 mL at 04/02/16 1052  . acetaminophen (TYLENOL) tablet 650 mg  650 mg Oral Q6H PRN Tasrif Ahmed, MD      . atorvastatin (LIPITOR) tablet 40 mg  40 mg Oral Daily Tasrif Ahmed, MD      . carvedilol (COREG) tablet 6.25 mg  6.25 mg Oral BID WC Tasrif Ahmed, MD      . hydrALAZINE (APRESOLINE) tablet 25 mg  25 mg Oral TID Tasrif Ahmed, MD      . insulin aspart (novoLOG) injection 0-5 Units  0-5 Units Subcutaneous QHS Tasrif Ahmed, MD      . Melene Muller ON 04/03/2016] insulin aspart (novoLOG) injection 0-9 Units  0-9 Units Subcutaneous TID WC Tasrif Ahmed, MD      . insulin glargine (LANTUS) Solostar Pen 14 Units  14 Units Subcutaneous QHS Tasrif  Ahmed, MD      . ipratropium-albuterol (DUONEB) 0.5-2.5 (3) MG/3ML nebulizer solution 3 mL  3 mL Nebulization Q6H PRN Tasrif Ahmed, MD      . Melene Muller[START ON 04/03/2016] levothyroxine (SYNTHROID, LEVOTHROID) tablet 25 mcg  25 mcg Oral QAC breakfast Tasrif Ahmed, MD      . Melene Muller[START ON 04/03/2016] nicotine (NICODERM CQ - dosed in mg/24 hours) patch 14 mg  14 mg Transdermal Daily Tasrif Ahmed, MD       Current Outpatient Prescriptions  Medication Sig Dispense Refill  . amitriptyline (ELAVIL) 25 MG tablet Take 25 mg by mouth at bedtime.    Marland Kitchen. amoxicillin-clavulanate (AUGMENTIN) 875-125 MG tablet Take 1 tablet by mouth 2 (two) times daily.    Marland Kitchen. atorvastatin (LIPITOR) 40 MG tablet Take 40 mg by mouth daily.    . carvedilol (COREG) 6.25 MG tablet Take 6.25 mg by mouth 2 (two) times daily with a meal.    . docusate sodium (COLACE) 100 MG capsule Take 1 capsule (100 mg total) by mouth 2 (two) times daily. (Patient taking differently:  Take 100 mg by mouth daily. ) 10 capsule 0  . ferrous sulfate 325 (65 FE) MG tablet Take 325 mg by mouth 2 (two) times daily with a meal.    . furosemide (LASIX) 40 MG tablet Take 40 mg by mouth daily.     . hydrALAZINE (APRESOLINE) 25 MG tablet Take 25 mg by mouth 3 (three) times daily.    . insulin glargine (LANTUS) 100 unit/mL SOPN Inject 17 Units into the skin at bedtime.    . isosorbide mononitrate (IMDUR) 30 MG 24 hr tablet Take 1 tablet (30 mg total) by mouth daily. 30 tablet 1  . levothyroxine (SYNTHROID, LEVOTHROID) 25 MCG tablet Take 25 mcg by mouth daily before breakfast.    . nicotine (NICODERM CQ - DOSED IN MG/24 HOURS) 14 mg/24hr patch Place 14 mg onto the skin daily.    . OXYGEN Inhale into the lungs. 2lpm via [place] prn for shortness of breath or O2 sat <90%    . pregabalin (LYRICA) 100 MG capsule Take 100 mg by mouth every 8 (eight) hours.    Marland Kitchen. warfarin (COUMADIN) 6 MG tablet Take 6 mg by mouth daily at 6 PM.     . acetaminophen (TYLENOL) 325 MG tablet Take 650 mg by mouth every 6 (six) hours as needed for mild pain, moderate pain or fever.    Marland Kitchen. ipratropium-albuterol (DUONEB) 0.5-2.5 (3) MG/3ML SOLN Take 3 mLs by nebulization every 6 (six) hours as needed.    . nitroGLYCERIN (NITROSTAT) 0.4 MG SL tablet Place 0.4 mg under the tongue every 5 (five) minutes as needed for chest pain.    Marland Kitchen. oxyCODONE (OXY IR/ROXICODONE) 5 MG immediate release tablet Take 5 mg by mouth every 12 (twelve) hours as needed for severe pain.    . traMADol (ULTRAM) 50 MG tablet Take 50 mg by mouth every 8 (eight) hours as needed.       REVIEW OF SYSTEMS:  [X]  denotes positive finding, [ ]  denotes negative finding Cardiac  Comments:  Chest pain or chest pressure:    Shortness of breath upon exertion:    Short of breath when lying flat:    Irregular heart rhythm:        Vascular    Pain in calf, thigh, or hip brought on by ambulation:    Pain in feet at night that wakes you up from your sleep:     Blood  clot in your veins:    Leg swelling:         Pulmonary    Oxygen at home:    Productive cough:     Wheezing:         Neurologic    Sudden weakness in arms or legs:     Sudden numbness in arms or legs:     Sudden onset of difficulty speaking or slurred speech:    Temporary loss of vision in one eye:     Problems with dizziness:         Gastrointestinal    Blood in stool:     Vomited blood:         Genitourinary    Burning when urinating:     Blood in urine:        Psychiatric    Major depression:         Hematologic    Bleeding problems:    Problems with blood clotting too easily:        Skin    Rashes or ulcers:        Constitutional    Fever or chills:      PHYSICAL EXAM: Vitals:   04/02/16 1615 04/02/16 1630 04/02/16 1700 04/02/16 1715  BP: (!) 166/103 157/97 182/92 (!) 141/106  Pulse: 89 90 86 87  Resp: 16 18 14 15   Temp:      TempSrc:      SpO2: 96% 100% 99% 98%    GENERAL: The patient is a well-nourished male, in no acute distress. The vital signs are documented above. VASCULAR: right AKA, left foot with dry gangrene of left 4th toe and dry gangrene to distal 2nd toe. No drainage seen. No odor. There is some erythema to the plantar aspect of his left foot.  PULMONARY: Non labored respiratory effort.  MUSCULOSKELETAL: right BKA. 2+ pitting edema left leg.  NEUROLOGIC: No focal deficits. Patient appears alert and oriented.  SKIN: See above. PSYCHIATRIC: The patient has a normal affect.   MEDICAL ISSUES: Dry gangrene left 2nd and 4th toes  The patient's toes do not appear infected. His toes will likely continue to demarcate and auto-amputate. Dry gauze between left toes to wick moisture. No indication for abx. Agree with admission for work-up of supra therapeutic INR and AMS (although he does appear to be alert and oriented at this time). Will follow along.    Maris BergerKimberly Abbe Bula, PA-C [facility] [place] [phone]

## 2016-04-02 NOTE — ED Notes (Signed)
Pt has a superficial lac to lip, cleaned dry blood.  Checked for pt cell phone which I did not find.  [facility] to see if he left it behind.  Await call back

## 2016-04-02 NOTE — H&P (Signed)
Date: 04/02/2016               Patient Name:  Ronald Simpson MRN: [id number]  DOB: 1945-05-07 Age / Sex: 70 y.o., male   PCP: Tilden Domeichard Schneider, MD         Medical Service: Internal Medicine Teaching Service         Attending Physician: Dr. Doneen PoissonLawrence Klima, MD    First Contact: Dr. Reymundo Pollarolyn Hakeem Frazzini Pager: [phone]  Second Contact: Dr. Valentino NoseNathan Boswell  Pager: [phone]       After Hours (After 5p/  First Contact Pager: [phone]  weekends / holidays): Second Contact Pager: [phone]   Chief Complaint: AMS   History of Present Illness: Patient is a 70 yo M with pmhx of HTN, DM II, CAD s/p CABG, CVA, Hep B & C, paroxysmal a-fib, polysubstance abuse (cocaine, marijuana), and PVD s/p right AKA who presents with complaint of AMS. Patient reports he has been at [facility] living facility after his right AKA surgery. When he arrived to the ED the patient was very lethargic and difficult to arouse per ED provider. He contacted the facility who reported the patient had been increasingly lethargic for the past several days that acutely worsened this morning. His INR was check yesterday at the facility and found to be 11.7. He was send to the ED today for evaluation. Per chart review it appears patient has been receiving narcotic pain medication at his facility for pain. The ED provider administered 0.4 mg of narcan with no change in mental status, followed by an additional 1 mg to which patient responded immediately. He became alert and oriented and was able to communicate with the ED provider. By the time he was being evaluated for admission, patient was completely back to his baseline. The patient reported that the pain medicine was making him "hallucinate". He describes seeing people in his room that weren't really there and admits to feeling confused. He says he asked the facility to stop giving it to him. It is unclear when his last dose was.   In the ED he was hemodynamically stable (BP  141/106, HR 87, RR 15, T 97.4, and oxygen 98% on room air). He was noted to have gangrene of his 4th left toe with some spread of the gangrene to the plantar aspect of his foot. Per chart review, he is established with Dr. Arbie CookeyEarly who is aware and following him for this problem. Plain films of his left foot showed no evidence of osteomyelitis. Labs were significant for an elevated creatinine 1.8 (appears to be his baseline), mild leukocytosis 12.9, hemoglobin of 9.1 unchanged from prior labs, and INR > 10. CT head was negative for intracranial bleed. CXR with no acute cardiopulmonary disease.   Meds:  Current Meds  Medication Sig  . amitriptyline (ELAVIL) 25 MG tablet Take 25 mg by mouth at bedtime.  Marland Kitchen. amoxicillin-clavulanate (AUGMENTIN) 875-125 MG tablet Take 1 tablet by mouth 2 (two) times daily.  Marland Kitchen. atorvastatin (LIPITOR) 40 MG tablet Take 40 mg by mouth daily.  . carvedilol (COREG) 6.25 MG tablet Take 6.25 mg by mouth 2 (two) times daily with a meal.  . docusate sodium (COLACE) 100 MG capsule Take 1 capsule (100 mg total) by mouth 2 (two) times daily. (Patient taking differently: Take 100 mg by mouth daily. )  . ferrous sulfate 325 (65 FE) MG tablet Take 325 mg by mouth 2 (two) times daily with a meal.  . furosemide (LASIX) 40 MG  tablet Take 40 mg by mouth daily.   . hydrALAZINE (APRESOLINE) 25 MG tablet Take 25 mg by mouth 3 (three) times daily.  . insulin glargine (LANTUS) 100 unit/mL SOPN Inject 17 Units into the skin at bedtime.  . isosorbide mononitrate (IMDUR) 30 MG 24 hr tablet Take 1 tablet (30 mg total) by mouth daily.  Marland Kitchen. levothyroxine (SYNTHROID, LEVOTHROID) 25 MCG tablet Take 25 mcg by mouth daily before breakfast.  . nicotine (NICODERM CQ - DOSED IN MG/24 HOURS) 14 mg/24hr patch Place 14 mg onto the skin daily.  . OXYGEN Inhale into the lungs. 2lpm via [place] prn for shortness of breath or O2 sat <90%  . pregabalin (LYRICA) 100 MG capsule Take 100 mg by mouth every 8 (eight) hours.  Marland Kitchen.  warfarin (COUMADIN) 6 MG tablet Take 6 mg by mouth daily at 6 PM.      Allergies: Allergies as of 04/02/2016  . (No Known Allergies)   Past Medical History:  Diagnosis Date  . CAD (coronary artery disease)    01/15/16 he states he has had a heart attack  . CVA (cerebral infarction)   . DM (diabetes mellitus), type 2 with peripheral vascular complications (HCC)   . History of hepatitis B   . History of hepatitis C   . Peripheral vascular disease (HCC)     Family History: DM, HTN.   Social History: Currently residing at [facility] assisted [facility] facility for rehab. Reports he used to drink regularly and use cocaine and marijuana on a daily basis prior to going to his rehab facility. Current smoker, down to 1 cigarette from 10-15 a day.   Review of Systems: A complete ROS was negative except as per HPI.   Physical Exam: Blood pressure (!) 141/106, pulse 87, temperature 97.4 F (36.3 C), temperature source Rectal, resp. rate 15, SpO2 98 %. Constitutional: NAD, appears comfortable HEENT: Atraumatic, normocephalic. PERRL, anicteric sclera.  Neck: Supple, trachea midline.  Cardiovascular: Regular rate but irregular rhythm, no murmurs, rubs, or gallops.  Pulmonary/Chest: CTAB, no wheezes, rales, or rhonchi.  Abdominal: Soft, non tender, non distended. +BS.  Extremities: Right AKA with well healed incision. Left lower extremity with gangrene of his 4th toe, extending to the plantar aspect of his foot. Extremity is cool to touch but DP and PT pulses are intact on doppler. Non purulent. No evidence of drainage or infection.  Neurological: A&Ox3, CN II - XII grossly intact. No focal deficits.  Skin: No rashes or erythema       EKG: Personally reviewed. Low voltage ECG. NSR. T wave inversions in II, III, aVF, and V3-V6. No ST elevation or depression. No priors for comparison.   CXR: Personally reviewed. Prior sternotomy wires from CABG. Overall normal CXR.   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 Follows closely with Dr. Arbie CookeyEarly. Has follow up scheduled 05/14/2015. VVS consulted in ED who felt his toe will continue to demarcate and auto-amputate. No indication for antibiotics.  -- VVS following, appreciate recommendations  -- Encouraged smoking and cocaine cessation   Supratheurapeutic INR: Patient is on warfarin for paroxsymal a-fib. INR last checked at his facility 12/18 and found to be 11.8. Unfortunately lab had not resulted before patient was given his next dose of coumadin yesterday evening. INR on admission > 10. S/p 10 mg IV Vit K in the ED.  -- Hold warfarin; will  consult pharmacy -- Monitor for signs of bleeding  -- Daily INR, CBC  AMS: Likely medication induced from his narcotic pain medication, although it is unclear when he received his last dose. Mental status cleared s/p 2 doses of Narcan (0.4 mg, then 1 mg). Patient says he has asked his facility not to give him the medicine anymore as he feels like it makes him hallucinate.  -- Resolved -- UDS   HTN: -- Continue Hydralazine 25 mg TID -- Coreg 6.25 mg BID  HLD: -- Continue Lipitor 40 mg daily   Hypothyroidism: -- Continue Synthroid 25 mcg daily   DM II: Patient normally takes Lantus 17 QHS.  -- Lantus 14 units QHS -- SSI TID with meals and bedtime  -- CBG monitoring   FEN: NS 125 cc/hr, replete lytes prn, heart healthy diet  VTE ppx: SCDs Code Status: FULL   Dispo: Admit patient to Observation with expected length of stay less than 2 midnights.  Signed: Reymundo Poll, MD 04/02/2016, 5:48 PM  Pager: [phone]

## 2016-04-02 NOTE — ED Notes (Signed)
Pt sleeping. 

## 2016-04-02 NOTE — ED Notes (Signed)
MD aware of patient INR. RN ask EDP about Cath due to INR EDP advised to hold. EDP also made aware of patient bleeding in the mouth

## 2016-04-02 NOTE — ED Notes (Signed)
Patient transported to CT 

## 2016-04-02 NOTE — ED Notes (Signed)
Admitting doctors at the bedside  Were here for an hour they have just left  Family with pt

## 2016-04-02 NOTE — ED Notes (Signed)
First attempt to call report.

## 2016-04-02 NOTE — ED Notes (Signed)
Patient not in room at this time.

## 2016-04-02 NOTE — Progress Notes (Signed)
Nursing Home Location:  [facility]  Place of Service: SNF (31)  PCP: [name], MD  No Known Allergies  Chief Complaint  Patient presents with  . Acute Visit    Behavioral changes    HPI:  Patient is a 70 y.o. male seen today at [facility] for elevated INR with behavioral changes. Pt with hx of CVA, hep b and C, PVD, AKA, CAD, DM.  Pt was seen last week and treated with Augmentin for sinusitis after he had been feeling poorly with increase sinus congestion for several days.  Pt also on coumadin for a fib, previous INR 2.8 and recheck scheduled for 12/15 however pt refused recheck and was done on 12/18 which was elevated at 11.8, lab came today to redraw to verify but pt refused. Pt also received coumadin last night because INR value was not back. Staff reports he has been more combative and confused over the last few days. Today he is more lethargic and hard to arousal. Alert to self only.   Also noted pt had a fall last week, no injury was noted.    Review of Systems:  Review of Systems  Unable to perform ROS: Acuity of condition    Past Medical History:  Diagnosis Date  . CAD (coronary artery disease)    01/15/16 he states he has had a heart attack  . CVA (cerebral infarction)   . DM (diabetes mellitus), type 2 with peripheral vascular complications (HCC)   . History of hepatitis B   . History of hepatitis C   . Peripheral vascular disease [facility])    Past Surgical History:  Procedure Laterality Date  . AMPUTATION Right 01/07/2016   Procedure: AMPUTATION ABOVE KNEE;  Surgeon: Larina Earthly, MD;  Location: [facility] OR;  Service: Vascular;  Laterality: Right;  . BELOW KNEE LEG AMPUTATION Right   . CORONARY ARTERY BYPASS GRAFT  2005  . PERIPHERAL VASCULAR CATHETERIZATION N/A 01/11/2016   Procedure: Lower Extremity Angiography;  Surgeon: Nada Libman, MD;  Location: [facility] INVASIVE CV LAB;  Service: Cardiovascular;  Laterality: N/A;  . STERNOTOMY     Social  History:   reports that he has been smoking.  He has been smoking about 0.50 packs per day. He has never used smokeless tobacco. He reports that he drinks about 8.4 oz of alcohol per week . He reports that he uses drugs, including Cocaine and Marijuana.  Family History  Problem Relation Age of Onset  . Hypertension Other   . Diabetes Other   . Mental illness Other   . Lupus Other   . Diabetes Father   . Cancer Neg Hx   . Heart disease Neg Hx   . Stroke Neg Hx     Medications: Patient's Medications  New Prescriptions   No medications on file  Previous Medications   ACETAMINOPHEN (TYLENOL) 325 MG TABLET    Take 650 mg by mouth every 6 (six) hours as needed for mild pain, moderate pain or fever.   AMITRIPTYLINE (ELAVIL) 25 MG TABLET    Take 25 mg by mouth at bedtime.   ATORVASTATIN (LIPITOR) 40 MG TABLET    Take 40 mg by mouth daily.   CARVEDILOL (COREG) 6.25 MG TABLET    Take 6.25 mg by mouth 2 (two) times daily with a meal.   DOCUSATE SODIUM (COLACE) 100 MG CAPSULE    Take 1 capsule (100 mg total) by mouth 2 (two) times daily.   FERROUS SULFATE 325 (  65 FE) MG TABLET    Take 325 mg by mouth 2 (two) times daily with a meal.   FUROSEMIDE (LASIX) 40 MG TABLET    Take 40 mg by mouth.   GABAPENTIN (NEURONTIN) 100 MG CAPSULE    Take 100 mg by mouth 3 (three) times daily.   HYDRALAZINE (APRESOLINE) 25 MG TABLET    Take 25 mg by mouth 3 (three) times daily.   INSULIN GLARGINE (LANTUS) 100 UNIT/ML SOPN    Inject 17 Units into the skin at bedtime.   IPRATROPIUM-ALBUTEROL (DUONEB) 0.5-2.5 (3) MG/3ML SOLN    Take 3 mLs by nebulization every 6 (six) hours as needed.   ISOSORBIDE MONONITRATE (IMDUR) 30 MG 24 HR TABLET    Take 1 tablet (30 mg total) by mouth daily.   LEVOTHYROXINE (SYNTHROID, LEVOTHROID) 25 MCG TABLET    Take 25 mcg by mouth daily before breakfast.   NICOTINE (NICODERM CQ - DOSED IN MG/24 HOURS) 14 MG/24HR PATCH    Place 14 mg onto the skin daily.   NITROGLYCERIN (NITROSTAT) 0.4 MG  SL TABLET    Place 0.4 mg under the tongue every 5 (five) minutes as needed for chest pain.   OXYCODONE (OXY IR/ROXICODONE) 5 MG IMMEDIATE RELEASE TABLET    Take 5 mg by mouth every 12 (twelve) hours as needed for severe pain.   OXYGEN    Inhale into the lungs. 2lpm via [place] prn for shortness of breath or O2 sat <90%   PREGABALIN (LYRICA) 100 MG CAPSULE    Take 100 mg by mouth every 8 (eight) hours.   TRAMADOL (ULTRAM) 50 MG TABLET    Take 50 mg by mouth every 8 (eight) hours as needed.    WARFARIN (COUMADIN) 1 MG TABLET    Take 5 mg by mouth daily.   Modified Medications   No medications on file  Discontinued Medications   OXYCODONE (ROXICODONE) 5 MG IMMEDIATE RELEASE TABLET    Take 1 tablet (5 mg total) by mouth every 8 (eight) hours as needed for severe pain.     Physical Exam: Vitals:   04/02/16 0937  BP: 114/80  Pulse: 70  Resp: 18  Temp: 97.8 F (36.6 C)  SpO2: 92%  Weight: 184 lb (83.5 kg)  Height: 5\' 10"  (1.778 m)    Physical Exam  Constitutional: He appears well-developed. He appears lethargic.  HENT:  Head: Normocephalic and atraumatic.  Mouth/Throat: Mucous membranes are pale and dry. No oropharyngeal exudate.  Eyes: Conjunctivae and EOM are normal. Pupils are equal, round, and reactive to light.  Neck: Normal range of motion. Neck supple.  Cardiovascular: Normal rate, regular rhythm and normal heart sounds.   Diminished pedal pulse to left   Pulmonary/Chest: Effort normal. Tachypnea noted.  Diminished throughout   Abdominal: Soft. Bowel sounds are decreased.  Musculoskeletal: He exhibits no edema or tenderness.  Right AKA with well healed incision   Neurological: He appears lethargic. Abnormal reflex:    Pupils equal but slow to react.  Weakness noted to upper extremities but strength equal  Skin: Skin is warm and dry. He is not diaphoretic. There is pallor.  Psychiatric: He has a normal mood and affect.    Labs reviewed: Basic Metabolic Panel:  Recent  Labs  01/12/16 0305 01/13/16 1147 01/14/16 0528  02/16/16 02/23/16 03/03/16  NA 135 137 138  < > 137 137 139  K 4.0 4.1 4.0  < > 4.4 4.9 5.0  CL 104 108 108  --   --   --   --  CO2 24 23 25   --   --   --   --   GLUCOSE 148* 113* 128*  --   --   --   --   BUN 13 14 14   < > 30* 40* 37*  CREATININE 1.53* 1.45* 1.48*  < > 1.8* 1.5* 1.7*  CALCIUM 8.3* 8.2* 8.4*  --   --   --   --   < > = values in this interval not displayed. Liver Function Tests:  Recent Labs  12/29/15 0530 12/31/15 0824 01/07/16 0732  AST 222* 50* 28  ALT 91* 50 19  ALKPHOS 167* 176* 165*  BILITOT 0.5 0.5 0.8  PROT 8.0 6.3* 6.9  ALBUMIN 1.6* 1.5* 1.5*   No results for input(s): LIPASE, AMYLASE in the last 8760 hours. No results for input(s): AMMONIA in the last 8760 hours. CBC:  Recent Labs  12/26/15 1908  01/12/16 0305 01/13/16 0538 01/14/16 0528 02/16/16 03/04/16  WBC 26.1*  < > 7.5 7.3 8.0 7.1 4.9  NEUTROABS 23.2*  --   --   --   --   --   --   HGB 12.2*  < > 9.1* 9.0* 9.2* 8.6* 9.1*  HCT 36.4*  < > 29.5* 29.3* 30.1* 27* 30*  MCV 89.4  < > 92.5 92.1 92.6  --   --   PLT 337  < > 242 248 268 158 129*  < > = values in this interval not displayed. TSH:  Recent Labs  03/03/16  TSH 4.44   A1C: Lab Results  Component Value Date   HGBA1C 9.1 (H) 12/27/2015   Lipid Panel: No results for input(s): CHOL, HDL, LDLCALC, TRIG, CHOLHDL, LDLDIRECT in the last 8760 hours.  Assessment/Plan 1. Supratherapeutic INR INR of 11.8 yesterday, pt refused lab today for recheck. Coumadin was given yesterday.   2. Altered mental status, unspecified altered mental status type With supratheraputic INR and changes in mental status will send to ED for further evaluation  3. Chronic pain syndrome Pt with chronic pain on oxycodone which is being titrated. lyrica also decreased to 100 mg TID due to worsening renal function. Oxycodone was decreased to every 12 hours as needed last week and he has not received a dose  per MAR since 12/17.     Janene HarveyJessica K. Biagio BorgEubanks, AGNP  [facility] [phone](Monday-Friday 8 am - 5 pm) [phone] (after hours)

## 2016-04-02 NOTE — ED Notes (Signed)
EKG given to Dr. Cardama  
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
## 2016-04-02 NOTE — ED Notes (Signed)
Patient suction airway.

## 2016-04-02 NOTE — ED Notes (Signed)
Patient responds to voice.

## 2016-04-03 ENCOUNTER — Observation Stay (HOSPITAL_COMMUNITY): Payer: Medicare Other

## 2016-04-03 DIAGNOSIS — F1421 Cocaine dependence, in remission: Secondary | ICD-10-CM

## 2016-04-03 DIAGNOSIS — I10 Essential (primary) hypertension: Secondary | ICD-10-CM

## 2016-04-03 DIAGNOSIS — Z89611 Acquired absence of right leg above knee: Secondary | ICD-10-CM

## 2016-04-03 DIAGNOSIS — Z7901 Long term (current) use of anticoagulants: Secondary | ICD-10-CM

## 2016-04-03 DIAGNOSIS — F1221 Cannabis dependence, in remission: Secondary | ICD-10-CM

## 2016-04-03 DIAGNOSIS — E785 Hyperlipidemia, unspecified: Secondary | ICD-10-CM

## 2016-04-03 DIAGNOSIS — B9689 Other specified bacterial agents as the cause of diseases classified elsewhere: Secondary | ICD-10-CM

## 2016-04-03 DIAGNOSIS — Z951 Presence of aortocoronary bypass graft: Secondary | ICD-10-CM

## 2016-04-03 DIAGNOSIS — Z8673 Personal history of transient ischemic attack (TIA), and cerebral infarction without residual deficits: Secondary | ICD-10-CM

## 2016-04-03 DIAGNOSIS — Z794 Long term (current) use of insulin: Secondary | ICD-10-CM

## 2016-04-03 DIAGNOSIS — I2510 Atherosclerotic heart disease of native coronary artery without angina pectoris: Secondary | ICD-10-CM

## 2016-04-03 DIAGNOSIS — N390 Urinary tract infection, site not specified: Secondary | ICD-10-CM

## 2016-04-03 DIAGNOSIS — I480 Paroxysmal atrial fibrillation: Secondary | ICD-10-CM

## 2016-04-03 DIAGNOSIS — F17210 Nicotine dependence, cigarettes, uncomplicated: Secondary | ICD-10-CM

## 2016-04-03 DIAGNOSIS — R791 Abnormal coagulation profile: Secondary | ICD-10-CM

## 2016-04-03 DIAGNOSIS — E1152 Type 2 diabetes mellitus with diabetic peripheral angiopathy with gangrene: Secondary | ICD-10-CM | POA: Diagnosis not present

## 2016-04-03 DIAGNOSIS — Z79899 Other long term (current) drug therapy: Secondary | ICD-10-CM

## 2016-04-03 DIAGNOSIS — E039 Hypothyroidism, unspecified: Secondary | ICD-10-CM

## 2016-04-03 DIAGNOSIS — N3090 Cystitis, unspecified without hematuria: Secondary | ICD-10-CM

## 2016-04-03 LAB — PROTIME-INR
INR: 1.69
Prothrombin Time: 20.1 seconds — ABNORMAL HIGH (ref 11.4–15.2)

## 2016-04-03 LAB — GLUCOSE, CAPILLARY
GLUCOSE-CAPILLARY: 152 mg/dL — AB (ref 65–99)
GLUCOSE-CAPILLARY: 177 mg/dL — AB (ref 65–99)
Glucose-Capillary: 257 mg/dL — ABNORMAL HIGH (ref 65–99)
Glucose-Capillary: 160 mg/dL — ABNORMAL HIGH (ref 65–99)

## 2016-04-03 LAB — HEMOGLOBIN A1C
HEMOGLOBIN A1C: 6.5 % — AB (ref 4.8–5.6)
Mean Plasma Glucose: 140 mg/dL

## 2016-04-03 LAB — URINE CULTURE: Culture: >=100000 — AB

## 2016-04-03 LAB — MRSA PCR SCREENING: MRSA by PCR: POSITIVE — AB

## 2016-04-03 MED ORDER — SULFAMETHOXAZOLE-TRIMETHOPRIM 800-160 MG PO TABS
1.0000 | ORAL_TABLET | Freq: Two times a day (BID) | ORAL | Status: DC
  Administered 2016-04-03: 1 via ORAL
  Filled 2016-04-03: qty 1

## 2016-04-03 MED ORDER — VANCOMYCIN HCL IN DEXTROSE 750-5 MG/150ML-% IV SOLN
750.0000 mg | Freq: Two times a day (BID) | INTRAVENOUS | Status: DC
  Administered 2016-04-04 – 2016-04-05 (×3): 750 mg via INTRAVENOUS
  Filled 2016-04-03 (×3): qty 150

## 2016-04-03 MED ORDER — MUPIROCIN 2 % EX OINT
1.0000 "application " | TOPICAL_OINTMENT | Freq: Two times a day (BID) | CUTANEOUS | Status: AC
  Administered 2016-04-03 – 2016-04-07 (×10): 1 via NASAL
  Filled 2016-04-03 (×3): qty 22

## 2016-04-03 MED ORDER — PNEUMOCOCCAL VAC POLYVALENT 25 MCG/0.5ML IJ INJ
0.5000 mL | INJECTION | INTRAMUSCULAR | Status: AC
  Administered 2016-04-05: 0.5 mL via INTRAMUSCULAR
  Filled 2016-04-03: qty 0.5

## 2016-04-03 MED ORDER — WHITE PETROLATUM GEL
Status: AC
  Administered 2016-04-03: 18:00:00
  Filled 2016-04-03: qty 1

## 2016-04-03 MED ORDER — IOPAMIDOL (ISOVUE-300) INJECTION 61%
INTRAVENOUS | Status: AC
  Administered 2016-04-03: 75 mL
  Filled 2016-04-03: qty 75

## 2016-04-03 MED ORDER — VANCOMYCIN HCL 10 G IV SOLR
1250.0000 mg | Freq: Once | INTRAVENOUS | Status: AC
  Administered 2016-04-03: 1250 mg via INTRAVENOUS
  Filled 2016-04-03: qty 1250

## 2016-04-03 MED ORDER — PIPERACILLIN-TAZOBACTAM 3.375 G IVPB
3.3750 g | Freq: Three times a day (TID) | INTRAVENOUS | Status: DC
  Administered 2016-04-03 – 2016-04-05 (×6): 3.375 g via INTRAVENOUS
  Filled 2016-04-03 (×7): qty 50

## 2016-04-03 MED ORDER — CHLORHEXIDINE GLUCONATE CLOTH 2 % EX PADS
6.0000 | MEDICATED_PAD | Freq: Every day | CUTANEOUS | Status: AC
  Administered 2016-04-03 – 2016-04-06 (×4): 6 via TOPICAL
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
## 2016-04-03 NOTE — Consult Note (Signed)
WOC consulted for left foot wound, reviewed images and vascular consult as well.  VVS does not plan on any surgical intervention and it is noted that this is dry gangrene to the 2nd toe with no drainage.    Per VVS note: The patient's toes do not appear infected. His toes will likely continue to demarcate and auto-amputate. Dry gauze between left toes to wick moisture.   WOC will make sure orders for dry gauze in the webspaces is ordered and will add to paint the toe with betadine swab daily to continue to keep dry and stable.   Re consult if needed, will not follow at this time. Thanks  Kushal Saunders M.D.[employer] MSN, RN,CWOCN, CNS [phone])
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
## 2016-04-03 NOTE — Clinical Social Work Note (Signed)
Clinical Social Work Assessment  Patient Details  Name: Ronald Simpson MRN: [id number] Date of Birth: 1945/06/04  Date of referral:  04/03/16               Reason for consult:  Discharge Planning (Ad[facility])                Permission sought to share information with:  Case Manager, Magazine features editoracility Contact Representative, Family Supports Permission granted to share information::  Yes, Verbal Permission Granted  Name::        Agency::  [facility]  Relationship::  neice  Contact Information:     Housing/Transportation Living arrangements for the past 2 months:  Skilled Building surveyorursing Facility Source of Information:  Patient, Medical Team, Sports coachCase Manager, Facility Patient Interpreter Needed:  None Criminal Activity/Legal Involvement Pertinent to Current Situation/Hospitalization:  No - Comment as needed Significant Relationships:  Other Family Members Lives with:  Facility Resident Do you feel safe going back to the place where you live?  Yes Need for family participation in patient care:  No (Coment)  Care giving concerns:  No concerns noted at this time by patient or facility. Patient is a [facility].  He will return at [place].  [facility] reports he has limited supports, but his niece is involved when necessary, but otherwise patient makes his own decisions.   Social Worker assessment / plan:  [facility] and [facility]W received consult that he is from SNF. Verified with SNF pt return and LTC in which reports they are agreeable.  No midnight stay required for return or authorization. [facility] once medically stable.  FL2 has been updated.  Employment status:  Retired, Disabled (Comment on whether or not currently receiving Disability) Insurance information:  Medicare PT Recommendations:    not assessed Information / Referral to community resources:    none  Patient/Family's Response to care:  Agreeable to plan  Patient/Family's  Understanding of and Emotional Response to Diagnosis, Current Treatment, and Prognosis:  Patient understanding of reason of admission due to wound.  Complying with treatment and agreeable for return.    Emotional Assessment Appearance:  Appears stated age Attitude/Demeanor/Rapport:  Other (calm and cooperative) Affect (typically observed):  Accepting, Adaptable Orientation:  Oriented to Self, Oriented to Place, Oriented to  Time, Oriented to Situation Alcohol / Substance use:  Not Applicable Psych involvement (Current and /or in the community):  No (Comment)  Discharge Needs  Concerns to be addressed:  No discharge needs identified Readmission within the last 30 days:  No Current discharge risk:  None Barriers to Discharge:  Continued Medical Work up   Raye SorrowCoble, Cristofer Yaffe N, LCSW 04/03/2016, 10:46 AM

## 2016-04-03 NOTE — Progress Notes (Signed)
Pharmacy Antibiotic Note  Ronald Simpson is a 70 y.o. male admitted on 04/02/2016.  Pt noted to have LLE gangrene of 4th toe.  To start Vancomycin and Zosyn with possible plans for amputation 12/22.    Plan: Vanc 1250mg  IV x 1, then 750mg  IVq12h Zosyn 3.375 gm IV q8h (4 hour infusion). Follow-up cx data and clinical progress Vancomycin trough at steady state  Height: 5\' 10"  (177.8 cm) Weight: 187 lb 6.3 oz (85 kg) IBW/kg (Calculated) : 73  Temp (24hrs), Avg:99.4 F (37.4 C), Min:98 F (36.7 C), Max:100.4 F (38 C)   Recent Labs Lab 04/02/16 1046 04/02/16 1105 04/02/16 1530  WBC 12.9*  --   --   CREATININE 1.84*  --  1.69*  LATICACIDVEN  --  0.66  --     Estimated Creatinine Clearance: 43.2 mL/min (by C-G formula based on SCr of 1.69 mg/dL (H)).    No Known Allergies  Antimicrobials this admission: Vanc 12/21 >> Zosyn 12/21 >> Septra PO x 1 12/21  Dose adjustments this admission:   Microbiology results: 12/21 BCx: pending 12/20 UCx: >100K yeast  12/21 MRSA PCR: positive  Thank you for allowing pharmacy to be a part of this patient's care.  Brandon MelnickHammons, Ranika Mcniel Ballard 04/03/2016 4:59 PM

## 2016-04-03 NOTE — NC FL2 (Addendum)
[place] MEDICAID FL2 LEVEL OF CARE SCREENING TOOL     IDENTIFICATION  Patient Name: Ronald Simpson Birthdate: [birth year] Sex: male Admission Date (Current Location): 04/02/2016  [facility] [place]   Facility and Address:  [facility], [address], [place], [place] [address]      Provider Number: [id number]  Attending Physician Name and Address:  Ronald PoissonLawrence Klima, MD  Relative Name and Phone Number:       Current Level of Care: Hospital Recommended Level of Care: Skilled Nursing Facility Prior Approval Number:    Date Approved/Denied:   PASRR Number:  [id number] [id number]  Discharge Plan: SNF    Current Diagnoses: Patient Active Problem List   Diagnosis Date Noted  . Cystitis   . 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 Somnolence 04/02/2016  . At risk for adverse drug event 02/14/2016  . Peripheral neuropathy (HCC) 01/31/2016  . Scrotal edema 01/15/2016  . Dry gangrene (HCC) 01/15/2016  . Renal insufficiency 01/15/2016  . PAD (peripheral artery disease) (HCC) 01/11/2016  . 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 Tobacco abuse   . Post-operative pain   . Tachycardia   . Hyponatremia   . Hypoalbuminemia   . MRSA cellulitis of left foot   . Serratia marcescens infection (HCC)   . Escherichia coli infection   . CAD in native artery   . Paroxysmal atrial fibrillation (HCC)   . Uncontrolled type 2 diabetes mellitus with complication (HCC)   . Hepatitis C without hepatic coma 12/27/2015  . Coronary artery disease due to lipid rich plaque 12/27/2015  . Type 2 diabetes mellitus with diabetic peripheral angiopathy without gangrene, without long-term current use of insulin (HCC) 12/27/2015  . History of CVA (cerebrovascular accident) 12/27/2015  . AKI (acute kidney injury) (HCC) 12/27/2015  . Hypothyroidism, acquired 12/27/2015  . Benign essential  HTN 12/27/2015  . Sepsis (HCC) 12/26/2015    Orientation RESPIRATION BLADDER Height & Weight     Self, Time, Situation, Place  Normal Continent Weight: 187 lb 6.3 oz (85 kg) Height:  5\' 10"  (177.8 cm)  BEHAVIORAL SYMPTOMS/MOOD NEUROLOGICAL BOWEL NUTRITION STATUS      Continent Diet (regular/see DC summary)  AMBULATORY STATUS COMMUNICATION OF NEEDS Skin   Limited Assist Verbally Other (Comment) (diabetic toe wound)                       Personal Care Assistance Level of Assistance  Bathing, Feeding, Dressing Bathing Assistance: Limited assistance Feeding assistance: Limited assistance Dressing Assistance: Limited assistance     Functional Limitations Info  Sight, Hearing, Speech Sight Info: Adequate Hearing Info: Adequate Speech Info: Adequate    SPECIAL CARE FACTORS FREQUENCY                       Contractures Contractures Info: Not present    Additional Factors Info  Code Status, Allergies, Isolation Precautions Code Status Info: Full Code Allergies Info: NKA     Isolation Precautions Info: on contact for MRSA     Current Medications (04/03/2016):  This is the current hospital active medication list Current Facility-Administered Medications  Medication Dose Route Frequency Provider Last Rate Last Dose  . acetaminophen (TYLENOL) tablet 650 mg  650 mg Oral Q6H PRN Ronald Ahmed, MD      . atorvastatin (LIPITOR) tablet 40 mg  40 mg Oral Daily Ronald Meekerasrif Ahmed, MD  40 mg at 04/03/16 1004  . carvedilol (COREG) tablet 6.25 mg  6.25 mg Oral BID WC Ronald Ahmed, MD   6.25 mg at 04/03/16 0848  . hydrALAZINE (APRESOLINE) tablet 25 mg  25 mg Oral TID Ronald Meekerasrif Ahmed, MD   25 mg at 04/03/16 1004  . insulin aspart (novoLOG) injection 0-5 Units  0-5 Units Subcutaneous QHS Ronald Ahmed, MD      . insulin aspart (novoLOG) injection 0-9 Units  0-9 Units Subcutaneous TID WC Ronald Meekerasrif Ahmed, MD   2 Units at 04/03/16 0848  . insulin glargine (LANTUS) injection 14 Units  14 Units  Subcutaneous QHS Ronald Ahmed, MD   14 Units at 04/02/16 2310  . ipratropium-albuterol (DUONEB) 0.5-2.5 (3) MG/3ML nebulizer solution 3 mL  3 mL Nebulization Q6H PRN Ronald Ahmed, MD      . levothyroxine (SYNTHROID, LEVOTHROID) tablet 25 mcg  25 mcg Oral QAC breakfast Ronald Meekerasrif Ahmed, MD   25 mcg at 04/03/16 0848  . nicotine (NICODERM CQ - dosed in mg/24 hours) patch 14 mg  14 mg Transdermal Daily Ronald Ahmed, MD   14 mg at 04/03/16 1004  . [START ON 04/04/2016] pneumococcal 23 valent vaccine (PNU-IMMUNE) injection 0.5 mL  0.5 mL Intramuscular Tomorrow-1000 Ronald PoissonLawrence Klima, MD         Discharge Medications: Please see discharge summary for a list of discharge medications.  Relevant Imaging Results:  Relevant Lab Results:   Additional Information SSN: [id number]84 0434  Ronald Simpson, Ronald Simpson, [place]

## 2016-04-03 NOTE — Care Management Obs Status (Signed)
MEDICARE OBSERVATION STATUS NOTIFICATION   Patient Details  Name: Ronald Simpson MRN: [id number] Date of Birth: 01-22-1946   Medicare Observation Status Notification Given:  Yes    Kingsley PlanWile, Zeanna Sunde Marie, RN 04/03/2016, 2:39 PM

## 2016-04-03 NOTE — Progress Notes (Addendum)
Subjective: Patient feels well today and back to his baseline. No events overnight and no complaints today.   Objective:  Vital signs in last 24 hours: Vitals:   04/03/16 0353 04/03/16 0600 04/03/16 0645 04/03/16 1005  BP: (!) 160/80 (!) 151/85 (!) 146/77 139/69  Pulse: 95 93 92 90  Resp: 20 18 18    Temp: (!) 100.4 F (38 C) 99 F (37.2 C) (!) 100.4 F (38 C)   TempSrc: Oral Oral Oral   SpO2: 98% 99% 99% 99%  Weight:      Height:       Physical Exam: Constitutional: NAD, appears comfortable HEENT: Atraumatic, normocephalic. PERRL, anicteric sclera.  Neck: Supple, trachea midline.  Cardiovascular: Regular rate but irregular rhythm, no murmurs, rubs, or gallops.  Pulmonary/Chest: CTAB, no wheezes, rales, or rhonchi.  Abdominal: Soft, non tender, non distended. +BS.  Extremities: Right AKA with well healed incision. Left lower extremity with gangrene of his 4th toe, extending to the plantar aspect of his foot. Extremity is cool to touch. Non purulent. No evidence of drainage or infection.  Neurological: A&Ox3, CN II - XII grossly intact. No focal deficits.  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 Paged vascular with results, planning to start broad spectrum antibiotics. NPO after midnight. VVS will discuss toe amputation first thing tomorrow morning.   -- VVS following, appreciate recommendations  -- Broad spectrum antibiotics -- NPO at midnight  -- Encouraged smoking and cocaine cessation   Supratheurapeutic INR: On warfarin for paroxsymal a-fib. INR on admission > 10. S/p 10 mg IV Vit K in the ED yesterday. INR is down to 1.69 today. -- Hold warfarin for possible surgery tomorrow  -- Daily INR, CBC  AMS: Initially felt to be medication induced as patient's mental status improved in the ED after the second round of narcan.  However UDS was later negative. Patient's last dose of pain medication was reportedly two days prior to admission. Further work up has revealed pyuria (although asymptomatic) and CT of his left foot today revealed osteomyelitis of his left 4th toe, which is most likely the culprit.  -- Currently resolved -- VVS consult as above   Yeast Cystitis: Likely candiduria. Admission UA revealed pyuria and this was Initially felt to be contributing to his AMS. He was started on bactrim but this was subsequently stopped after pharmacy made us aware of its interaction with warfarin. Urine culture later resulted as > 100,000 colonies of yeast. Will hold off on treatment for now. Patient does not have a foley in place and denied dysuria on admission. We will reassess tomorrow. If asymptomatic he may not require treatment. Otherwise for fluconazole susceptible candida, treatment would be 200 - 400 mg daily for 14 days.  -- Reassess tomorrow   Renal Dysfunction: Unclear baseline, likely some component of CKD -- Daily labs  -- Avoid nephrotoxic meds  HTN: -- Continue Hydralazine 25 mg TID -- Coreg 6.25 mg BID  HLD: -- Continue Lipitor 40 mg daily   Hypothyroidism: -- Continue Synthroid 25 mcg daily   DM II: Patient normally takes Lantus 17 QHS.  -- Lantus 14 units QHS -- SSI TID with meals and bedtime  -- CBG monitoring   FEN: No fluids, replete lytes prn, NPO at midnight  VTE ppx: SCDs Code Status: FULL   Dispo: Anticipated discharge pending VVS  recommendations for his left 4th toe osteomyelitis.   Reymundo Pollarolyn Zafirah Vanzee, MD 04/03/2016, 10:41 AM Pager: [phone]
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
## 2016-04-04 ENCOUNTER — Encounter (HOSPITAL_COMMUNITY)
Admission: EM | Disposition: A | Discharge status: Skilled Nursing Facility | Payer: Self-pay | Source: Home / Self Care | Attending: Internal Medicine

## 2016-04-04 ENCOUNTER — Observation Stay (HOSPITAL_COMMUNITY): Payer: Medicare Other | Admitting: Anesthesiology

## 2016-04-04 ENCOUNTER — Encounter (HOSPITAL_COMMUNITY): Payer: Self-pay | Admitting: Anesthesiology

## 2016-04-04 DIAGNOSIS — N390 Urinary tract infection, site not specified: Secondary | ICD-10-CM | POA: Diagnosis not present

## 2016-04-04 DIAGNOSIS — E1122 Type 2 diabetes mellitus with diabetic chronic kidney disease: Secondary | ICD-10-CM | POA: Diagnosis present

## 2016-04-04 DIAGNOSIS — Z89611 Acquired absence of right leg above knee: Secondary | ICD-10-CM | POA: Diagnosis not present

## 2016-04-04 DIAGNOSIS — N5089 Other specified disorders of the male genital organs: Secondary | ICD-10-CM | POA: Diagnosis not present

## 2016-04-04 DIAGNOSIS — I129 Hypertensive chronic kidney disease with stage 1 through stage 4 chronic kidney disease, or unspecified chronic kidney disease: Secondary | ICD-10-CM | POA: Diagnosis present

## 2016-04-04 DIAGNOSIS — N189 Chronic kidney disease, unspecified: Secondary | ICD-10-CM | POA: Diagnosis present

## 2016-04-04 DIAGNOSIS — F1210 Cannabis abuse, uncomplicated: Secondary | ICD-10-CM | POA: Diagnosis present

## 2016-04-04 DIAGNOSIS — Z7901 Long term (current) use of anticoagulants: Secondary | ICD-10-CM | POA: Diagnosis not present

## 2016-04-04 DIAGNOSIS — R791 Abnormal coagulation profile: Secondary | ICD-10-CM | POA: Diagnosis present

## 2016-04-04 DIAGNOSIS — I480 Paroxysmal atrial fibrillation: Secondary | ICD-10-CM | POA: Diagnosis present

## 2016-04-04 DIAGNOSIS — B3749 Other urogenital candidiasis: Secondary | ICD-10-CM | POA: Diagnosis not present

## 2016-04-04 DIAGNOSIS — F17210 Nicotine dependence, cigarettes, uncomplicated: Secondary | ICD-10-CM | POA: Diagnosis present

## 2016-04-04 DIAGNOSIS — B1910 Unspecified viral hepatitis B without hepatic coma: Secondary | ICD-10-CM | POA: Diagnosis present

## 2016-04-04 DIAGNOSIS — I96 Gangrene, not elsewhere classified: Secondary | ICD-10-CM

## 2016-04-04 DIAGNOSIS — E1169 Type 2 diabetes mellitus with other specified complication: Secondary | ICD-10-CM | POA: Diagnosis present

## 2016-04-04 DIAGNOSIS — N4889 Other specified disorders of penis: Secondary | ICD-10-CM | POA: Diagnosis not present

## 2016-04-04 DIAGNOSIS — E8809 Other disorders of plasma-protein metabolism, not elsewhere classified: Secondary | ICD-10-CM | POA: Diagnosis not present

## 2016-04-04 DIAGNOSIS — E1129 Type 2 diabetes mellitus with other diabetic kidney complication: Secondary | ICD-10-CM | POA: Diagnosis not present

## 2016-04-04 DIAGNOSIS — E785 Hyperlipidemia, unspecified: Secondary | ICD-10-CM | POA: Diagnosis present

## 2016-04-04 DIAGNOSIS — Z23 Encounter for immunization: Secondary | ICD-10-CM | POA: Diagnosis present

## 2016-04-04 DIAGNOSIS — R400 Somnolence: Secondary | ICD-10-CM | POA: Diagnosis present

## 2016-04-04 DIAGNOSIS — E1152 Type 2 diabetes mellitus with diabetic peripheral angiopathy with gangrene: Secondary | ICD-10-CM | POA: Diagnosis present

## 2016-04-04 DIAGNOSIS — F1410 Cocaine abuse, uncomplicated: Secondary | ICD-10-CM | POA: Diagnosis present

## 2016-04-04 DIAGNOSIS — R339 Retention of urine, unspecified: Secondary | ICD-10-CM | POA: Diagnosis not present

## 2016-04-04 DIAGNOSIS — I252 Old myocardial infarction: Secondary | ICD-10-CM | POA: Diagnosis not present

## 2016-04-04 DIAGNOSIS — Z89422 Acquired absence of other left toe(s): Secondary | ICD-10-CM

## 2016-04-04 DIAGNOSIS — Z794 Long term (current) use of insulin: Secondary | ICD-10-CM | POA: Diagnosis not present

## 2016-04-04 DIAGNOSIS — E1142 Type 2 diabetes mellitus with diabetic polyneuropathy: Secondary | ICD-10-CM | POA: Diagnosis present

## 2016-04-04 DIAGNOSIS — N179 Acute kidney failure, unspecified: Secondary | ICD-10-CM | POA: Diagnosis present

## 2016-04-04 DIAGNOSIS — E039 Hypothyroidism, unspecified: Secondary | ICD-10-CM | POA: Diagnosis present

## 2016-04-04 DIAGNOSIS — B1920 Unspecified viral hepatitis C without hepatic coma: Secondary | ICD-10-CM | POA: Diagnosis present

## 2016-04-04 DIAGNOSIS — R4182 Altered mental status, unspecified: Secondary | ICD-10-CM | POA: Diagnosis present

## 2016-04-04 DIAGNOSIS — I2510 Atherosclerotic heart disease of native coronary artery without angina pectoris: Secondary | ICD-10-CM | POA: Diagnosis present

## 2016-04-04 DIAGNOSIS — N289 Disorder of kidney and ureter, unspecified: Secondary | ICD-10-CM

## 2016-04-04 HISTORY — PX: AMPUTATION: SHX166

## 2016-04-04 LAB — GLUCOSE, CAPILLARY
GLUCOSE-CAPILLARY: 177 mg/dL — AB (ref 65–99)
GLUCOSE-CAPILLARY: 138 mg/dL — AB (ref 65–99)
Glucose-Capillary: 189 mg/dL — ABNORMAL HIGH (ref 65–99)
Glucose-Capillary: 206 mg/dL — ABNORMAL HIGH (ref 65–99)
Glucose-Capillary: 150 mg/dL — ABNORMAL HIGH (ref 65–99)
Glucose-Capillary: 172 mg/dL — ABNORMAL HIGH (ref 65–99)

## 2016-04-04 LAB — CBC
HEMATOCRIT: 28.8 % — AB (ref 39.0–52.0)
Hemoglobin: 9 g/dL — ABNORMAL LOW (ref 13.0–17.0)
MCH: 28.9 pg (ref 26.0–34.0)
MCHC: 31.3 g/dL (ref 30.0–36.0)
MCV: 92.6 fL (ref 78.0–100.0)
Platelets: 213 10*3/uL (ref 150–400)
RBC: 3.11 MIL/uL — AB (ref 4.22–5.81)
RDW: 15.8 % — ABNORMAL HIGH (ref 11.5–15.5)
WBC: 12 10*3/uL — AB (ref 4.0–10.5)

## 2016-04-04 LAB — BASIC METABOLIC PANEL
ANION GAP: 7 (ref 5–15)
BUN: 39 mg/dL — ABNORMAL HIGH (ref 6–20)
CO2: 20 mmol/L — AB (ref 22–32)
Calcium: 8.3 mg/dL — ABNORMAL LOW (ref 8.9–10.3)
Chloride: 108 mmol/L (ref 101–111)
Creatinine, Ser: 1.74 mg/dL — ABNORMAL HIGH (ref 0.61–1.24)
GFR calc Af Amer: 45 mL/min — ABNORMAL LOW (ref >60)
GFR calc non Af Amer: 39 mL/min — ABNORMAL LOW (ref >60)
GLUCOSE: 188 mg/dL — AB (ref 65–99)
POTASSIUM: 4.3 mmol/L (ref 3.5–5.1)
Sodium: 135 mmol/L (ref 135–145)

## 2016-04-04 SURGERY — AMPUTATION, FOOT, RAY
Anesthesia: General | Site: Foot | Laterality: Left

## 2016-04-04 MED ORDER — WARFARIN - PHARMACIST DOSING INPATIENT
Freq: Every day | Status: DC
  Administered 2016-04-05 – 2016-04-08 (×2)

## 2016-04-04 MED ORDER — 0.9 % SODIUM CHLORIDE (POUR BTL) OPTIME
TOPICAL | Status: DC | PRN
  Administered 2016-04-04: 1000 mL

## 2016-04-04 MED ORDER — FENTANYL CITRATE (PF) 250 MCG/5ML IJ SOLN
INTRAMUSCULAR | Status: AC
  Filled 2016-04-04: qty 5

## 2016-04-04 MED ORDER — PROPOFOL 10 MG/ML IV BOLUS
INTRAVENOUS | Status: DC | PRN
Start: 2016-04-04 — End: 2016-04-04
  Administered 2016-04-04: 150 mg via INTRAVENOUS

## 2016-04-04 MED ORDER — LACTATED RINGERS IV SOLN: INTRAVENOUS | Status: DC

## 2016-04-04 MED ORDER — LIDOCAINE 2% (20 MG/ML) 5 ML SYRINGE
INTRAMUSCULAR | Status: AC
  Filled 2016-04-04: qty 5

## 2016-04-04 MED ORDER — EPHEDRINE 5 MG/ML INJ
INTRAVENOUS | Status: AC
  Filled 2016-04-04: qty 20

## 2016-04-04 MED ORDER — FENTANYL CITRATE (PF) 100 MCG/2ML IJ SOLN
INTRAMUSCULAR | Status: AC
  Filled 2016-04-04: qty 2

## 2016-04-04 MED ORDER — ONDANSETRON HCL 4 MG/2ML IJ SOLN
INTRAMUSCULAR | Status: AC
  Filled 2016-04-04: qty 12

## 2016-04-04 MED ORDER — MORPHINE SULFATE (PF) 2 MG/ML IV SOLN
2.0000 mg | INTRAVENOUS | Status: DC | PRN
  Administered 2016-04-04: 2 mg via INTRAVENOUS
  Filled 2016-04-04: qty 1

## 2016-04-04 MED ORDER — ONDANSETRON HCL 4 MG/2ML IJ SOLN: 4.0000 mg | Freq: Four times a day (QID) | INTRAMUSCULAR | Status: DC | PRN

## 2016-04-04 MED ORDER — OXYCODONE HCL 5 MG/5ML PO SOLN: 5.0000 mg | Freq: Once | ORAL | Status: DC | PRN

## 2016-04-04 MED ORDER — FENTANYL CITRATE (PF) 100 MCG/2ML IJ SOLN
25.0000 ug | INTRAMUSCULAR | Status: DC | PRN
  Administered 2016-04-04 (×2): 25 ug via INTRAVENOUS

## 2016-04-04 MED ORDER — HYDROMORPHONE HCL 2 MG/ML IJ SOLN
0.5000 mg | INTRAMUSCULAR | Status: DC | PRN
  Administered 2016-04-04 – 2016-04-05 (×3): 0.5 mg via INTRAVENOUS
  Filled 2016-04-04 (×3): qty 1

## 2016-04-04 MED ORDER — PROPOFOL 10 MG/ML IV BOLUS
INTRAVENOUS | Status: AC
  Filled 2016-04-04: qty 20

## 2016-04-04 MED ORDER — FENTANYL CITRATE (PF) 100 MCG/2ML IJ SOLN
INTRAMUSCULAR | Status: DC | PRN
  Administered 2016-04-04 (×2): 25 ug via INTRAVENOUS

## 2016-04-04 MED ORDER — GLYCOPYRROLATE 0.2 MG/ML IJ SOLN
INTRAMUSCULAR | Status: DC | PRN
  Administered 2016-04-04: 0.2 mg via INTRAVENOUS

## 2016-04-04 MED ORDER — PHENYLEPHRINE HCL 10 MG/ML IJ SOLN
INTRAMUSCULAR | Status: DC | PRN
  Administered 2016-04-04 (×5): 80 ug via INTRAVENOUS

## 2016-04-04 MED ORDER — PHENYLEPHRINE HCL 10 MG/ML IJ SOLN
INTRAMUSCULAR | Status: DC | PRN
  Administered 2016-04-04: 50 ug/min via INTRAVENOUS

## 2016-04-04 MED ORDER — EPHEDRINE SULFATE 50 MG/ML IJ SOLN
INTRAMUSCULAR | Status: DC | PRN
  Administered 2016-04-04 (×3): 10 mg via INTRAVENOUS
  Administered 2016-04-04: 20 mg via INTRAVENOUS
  Administered 2016-04-04: 10 mg via INTRAVENOUS
  Administered 2016-04-04: 20 mg via INTRAVENOUS
  Administered 2016-04-04: 30 mg via INTRAVENOUS
  Administered 2016-04-04 (×2): 10 mg via INTRAVENOUS

## 2016-04-04 MED ORDER — MIDAZOLAM HCL 2 MG/2ML IJ SOLN
INTRAMUSCULAR | Status: AC
  Filled 2016-04-04: qty 2

## 2016-04-04 MED ORDER — SUGAMMADEX SODIUM 500 MG/5ML IV SOLN
INTRAVENOUS | Status: AC
  Filled 2016-04-04: qty 5

## 2016-04-04 MED ORDER — OXYCODONE HCL 5 MG PO TABS
5.0000 mg | ORAL_TABLET | ORAL | Status: DC | PRN
  Administered 2016-04-04 – 2016-04-07 (×6): 5 mg via ORAL
  Filled 2016-04-04 (×6): qty 1

## 2016-04-04 MED ORDER — PHENYLEPHRINE HCL 10 MG/ML IJ SOLN
INTRAMUSCULAR | Status: AC
  Filled 2016-04-04: qty 2

## 2016-04-04 MED ORDER — WARFARIN SODIUM 3 MG PO TABS
3.0000 mg | ORAL_TABLET | Freq: Once | ORAL | Status: AC
  Administered 2016-04-05: 3 mg via ORAL
  Filled 2016-04-04: qty 1

## 2016-04-04 MED ORDER — SODIUM CHLORIDE 0.9 % IV SOLN
INTRAVENOUS | Status: DC
  Administered 2016-04-05: 04:00:00 via INTRAVENOUS

## 2016-04-04 MED ORDER — LACTATED RINGERS IV SOLN
INTRAVENOUS | Status: DC
  Administered 2016-04-04 (×2): via INTRAVENOUS

## 2016-04-04 MED ORDER — OXYCODONE HCL 5 MG PO TABS: 5.0000 mg | ORAL_TABLET | Freq: Once | ORAL | Status: DC | PRN

## 2016-04-04 MED ORDER — PHENYLEPHRINE 40 MCG/ML (10ML) SYRINGE FOR IV PUSH (FOR BLOOD PRESSURE SUPPORT)
PREFILLED_SYRINGE | INTRAVENOUS | Status: AC
  Filled 2016-04-04: qty 10

## 2016-04-04 MED ORDER — ALBUMIN HUMAN 5 % IV SOLN
INTRAVENOUS | Status: DC | PRN
  Administered 2016-04-04: 15:00:00 via INTRAVENOUS

## 2016-04-04 SURGICAL SUPPLY — 34 items
BNDG CONFORM 2 STRL LF (GAUZE/BANDAGES/DRESSINGS) ×3 IMPLANT
BNDG GAUZE ELAST 4 BULKY (GAUZE/BANDAGES/DRESSINGS) ×3 IMPLANT
CANISTER SUCTION 2500CC (MISCELLANEOUS) ×3 IMPLANT
CLIP LIGATING EXTRA MED SLVR (CLIP) ×3 IMPLANT
CLIP LIGATING EXTRA SM BLUE (MISCELLANEOUS) IMPLANT
COVER SURGICAL LIGHT HANDLE (MISCELLANEOUS) ×6 IMPLANT
DRAPE HALF SHEET 40X57 (DRAPES) ×3 IMPLANT
ELECT REM PT RETURN 9FT ADLT (ELECTROSURGICAL) ×3
ELECTRODE REM PT RTRN 9FT ADLT (ELECTROSURGICAL) ×1 IMPLANT
GAUZE SPONGE 4X4 12PLY STRL (GAUZE/BANDAGES/DRESSINGS) ×3 IMPLANT
GLOVE BIOGEL PI IND STRL 6.5 (GLOVE) ×2 IMPLANT
GLOVE BIOGEL PI INDICATOR 6.5 (GLOVE) ×4
GLOVE ECLIPSE 6.5 STRL STRAW (GLOVE) ×3 IMPLANT
GLOVE SS BIOGEL STRL SZ 7.5 (GLOVE) ×1 IMPLANT
GLOVE SUPERSENSE BIOGEL SZ 7.5 (GLOVE) ×2
GOWN STRL REUS W/ TWL LRG LVL3 (GOWN DISPOSABLE) ×3 IMPLANT
GOWN STRL REUS W/TWL LRG LVL3 (GOWN DISPOSABLE) ×6
KIT BASIN OR (CUSTOM PROCEDURE TRAY) ×3 IMPLANT
KIT ROOM TURNOVER OR (KITS) ×3 IMPLANT
NEEDLE 22X1 1/2 (OR ONLY) (NEEDLE) IMPLANT
NS IRRIG 1000ML POUR BTL (IV SOLUTION) ×3 IMPLANT
PACK GENERAL/GYN (CUSTOM PROCEDURE TRAY) ×3 IMPLANT
PAD ARMBOARD 7.5X6 YLW CONV (MISCELLANEOUS) ×6 IMPLANT
SPECIMEN JAR SMALL (MISCELLANEOUS) IMPLANT
SPONGE GAUZE 4X4 12PLY STER LF (GAUZE/BANDAGES/DRESSINGS) ×3 IMPLANT
SUT ETHILON 3 0 PS 1 (SUTURE) ×3 IMPLANT
SUT VIC AB 3-0 SH 27 (SUTURE)
SUT VIC AB 3-0 SH 27X BRD (SUTURE) IMPLANT
SYR CONTROL 10ML LL (SYRINGE) IMPLANT
TOWEL OR 17X24 6PK STRL BLUE (TOWEL DISPOSABLE) IMPLANT
TOWEL OR 17X26 10 PK STRL BLUE (TOWEL DISPOSABLE) ×3 IMPLANT
TUBE ANAEROBIC SPECIMEN COL (MISCELLANEOUS) IMPLANT
UNDERPAD 30X30 (UNDERPADS AND DIAPERS) ×3 IMPLANT
WATER STERILE IRR 1000ML POUR (IV SOLUTION) ×3 IMPLANT

## 2016-04-04 NOTE — Transfer of Care (Signed)
Immediate Anesthesia Transfer of Care Note  Patient: Ronald Simpson  Procedure(s) Performed: Procedure(s): LEFT FOURTH AND FIFTH  TOE AMPUTATION (Left)  Patient Location: PACU  Anesthesia Type:General  Level of Consciousness: awake, pateint uncooperative and confused  Airway & Oxygen Therapy: Patient Spontanous Breathing and Patient connected to face mask oxygen  Post-op Assessment: Report given to RN, Post -op Vital signs reviewed and stable and Patient moving all extremities  Post vital signs: Reviewed and stable  Last Vitals:  Vitals:   04/04/16 1100 04/04/16 1520  BP: (!) 148/77 102/73  Pulse:  90  Resp:  12  Temp:  36.4 C    Last Pain:  Vitals:   04/04/16 1520  TempSrc:   PainSc: 0-No pain         Complications: No apparent anesthesia complications

## 2016-04-04 NOTE — Progress Notes (Signed)
ANTICOAGULATION CONSULT NOTE - Initial Consult  Pharmacy Consult for coumadin Indication: atrial fibrillation  No Known Allergies  Patient Measurements: Height: 5\' 10"  (177.8 cm) Weight: 187 lb 6.3 oz (85 kg) IBW/kg (Calculated) : 73 Heparin Dosing Weight:   Vital Signs: Temp: 99 F (37.2 C) (12/22 2135) Temp Source: Oral (12/22 2135) BP: 125/76 (12/22 2135) Pulse Rate: 83 (12/22 2135)  Labs:  Recent Labs  04/02/16 1046 04/02/16 1530 04/03/16 0445 04/04/16 0356  HGB 9.1*  --   --  9.0*  HCT 28.9*  --   --  28.8*  PLT 185  --   --  213  LABPROT >90.0*  --  20.1*  --   INR >10.00*  --  1.69  --   CREATININE 1.84* 1.69*  --  1.74*    Estimated Creatinine Clearance: 42 mL/min (by C-G formula based on SCr of 1.74 mg/dL (H)).   Medical History: Past Medical History:  Diagnosis Date  . CAD (coronary artery disease)    01/15/16 he states he has had a heart attack  . CVA (cerebral infarction)   . DM (diabetes mellitus), type 2 with peripheral vascular complications (HCC)   . History of hepatitis B   . History of hepatitis C   . Peripheral vascular disease (HCC)     Medications:  Scheduled:  . atorvastatin  40 mg Oral Daily  . carvedilol  6.25 mg Oral BID WC  . Chlorhexidine Gluconate Cloth  6 each Topical Q0600  . fentaNYL      . hydrALAZINE  25 mg Oral TID  . insulin aspart  0-5 Units Subcutaneous QHS  . insulin aspart  0-9 Units Subcutaneous TID WC  . insulin glargine  14 Units Subcutaneous QHS  . levothyroxine  25 mcg Oral QAC breakfast  . mupirocin ointment  1 application Nasal BID  . nicotine  14 mg Transdermal Daily  . piperacillin-tazobactam (ZOSYN)  IV  3.375 g Intravenous Q8H  . pneumococcal 23 valent vaccine  0.5 mL Intramuscular Tomorrow-1000  . vancomycin  750 mg Intravenous Q12H  . warfarin  3 mg Oral Once  . Warfarin - Pharmacist Dosing Inpatient   Does not apply q1800   Infusions:  . sodium chloride 50 mL/hr at 04/04/16 1702     Assessment: 70 yo male with hx of afib will be resumed on coumadin.  INR yesterday was 1.69.  Home coumadin dose was 6 mg po daily but admitting INR was 11.8.  Goal of Therapy:  INR 2-3 Monitor platelets by anticoagulation protocol: Yes   Plan:  - coumadin 3 mg po x1 - INR in am  Trampas Stettner, Tsz-Yin 04/04/2016,10:14 PM

## 2016-04-04 NOTE — H&P (View-Only) (Signed)
Patient ID: Ronald Simpson, male   DOB: 07/30/1945, 70 y.o.   MRN: [id number] Comfortable this morning. No change in his foot. Does have dry gangrene with some erythema at the base and some over his metatarsal head. Discussed CT scan findings with the gassiness fourth toe and obvious osteopenia. No evidence of fluid in his foot. Will take a the operating room today for open fourth toe amputation. Also has an eschar over the second toe tip. We will debride this as well. Understands we may find more infection and will probably treat this at the time of surgery. Continues to have palpable popliteal pulse.

## 2016-04-04 NOTE — Progress Notes (Signed)
   Subjective:  Patient feels well today and back to his baseline. No events overnight and no complaints today. Not excited about the prospect of surgery today.  Objective:  Vital signs in last 24 hours: Vitals:   04/04/16 1550 04/04/16 1602 04/04/16 1634 04/04/16 2135  BP: 114/78 105/67 114/72 125/76  Pulse: 87 89  83  Resp: 15 18 18 18   Temp:  98.4 F (36.9 C) 97.5 F (36.4 C) 99 F (37.2 C)  TempSrc:    Oral  SpO2: 94% 95% 99% 92%  Weight:      Height:       Physical Exam: Constitutional: NAD, appears comfortable HEENT: Atraumatic, normocephalic. PERRL, anicteric sclera.  Neck: Supple, trachea midline.  Cardiovascular: Regular rate but irregular rhythm, no murmurs, rubs, or gallops.  Pulmonary/Chest: CTAB, no wheezes, rales, or rhonchi.  Abdominal: Soft, non tender, non distended. +BS.  Extremities: Right AKA with well healed incision. Left lower extremity with gangrene of his 4th toe, extending to the plantar aspect of his foot. Extremity is cool to touch. Non purulent. No evidence of drainage or infection.  Neurological: A&Ox3, CN II - XII grossly intact. No focal deficits.  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 Underwent amputation of his 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s of his left foot today. Tolerated well.  -- VVS following, appreciate recommendations  -- Broad spectrum antibioticS -- Encouraged smoking and cocaine cessation   Supratheurapeutic INR: On warfarin for paroxsymal a-fib. INR on admission > 10. S/p 10 mg IV Vit K in the ED. INR is down to 1.69 yesterday. -- Restart warfarin this evening -- Daily INR, CBC  AMS: Initially felt to be medication induced as patient's mental status improved in the ED after the second round of narcan. However UDS was later negative. Patient's last dose of pain medication was reportedly two days  prior to admission. Further work up has revealed pyuria (although asymptomatic) and CT of his left foot today revealed osteomyelitis of his left 4th toe, which is most likely the culprit.  -- Currently resolved -- VVS consult as above   Yeast Cystitis: Likely candiduria. Admission UA revealed pyuria and this was Initially felt to be contributing to his AMS. He was started on bactrim but this was subsequently stopped after pharmacy made us aware of its interaction with warfarin. Urine culture later resulted as > 100,000 colonies of yeast. No indication for treatment as he is not neutropenic and has not had any urinary tract manipulation.   Renal Dysfunction: Unclear baseline, likely some component of CKD -- Daily labs  -- Avoid nephrotoxic meds  KGM:WNUUVOHTN:Stable -- Continue Hydralazine 25 mg TID -- Coreg 6.25 mg BID  HLD: -- Continue Lipitor 40 mg daily   Hypothyroidism: -- Continue Synthroid 25 mcg daily   DM II: Patient normally takes Lantus 17 QHS.  -- Lantus 14 units QHS -- SSI TID with meals and bedtime  -- CBG monitoring   FEN: No fluids, replete lytes prn, Carb Mod  VTE ppx: SCDs Code Status: FULL   Dispo: Anticipated discharge to SNF once cleared by surgery  Ronald NoseNathan Zakery Normington, MD 04/04/2016, 10:04 PM Pager: [phone]

## 2016-04-04 NOTE — Anesthesia Preprocedure Evaluation (Signed)
Anesthesia Evaluation  Patient identified by MRN, date of birth, ID band Patient awake    Reviewed: Allergy & Precautions, NPO status , Patient's Chart, lab work & pertinent test results  Airway Mallampati: II   Neck ROM: full    Dental   Pulmonary former smoker,    breath sounds clear to auscultation       Cardiovascular hypertension, + CAD, + CABG and + Peripheral Vascular Disease   Rhythm:regular Rate:Normal     Neuro/Psych  Neuromuscular disease    GI/Hepatic (+)     substance abuse  cocaine use and marijuana use, Hepatitis -, C  Endo/Other  diabetes, Type 2Hypothyroidism   Renal/GU Renal InsufficiencyRenal disease     Musculoskeletal   Abdominal   Peds  Hematology  (+) anemia ,   Anesthesia Other Findings   Reproductive/Obstetrics                             Anesthesia Physical Anesthesia Plan  ASA: III  Anesthesia Plan: General   Post-op Pain Management:    Induction: Intravenous  Airway Management Planned: LMA  Additional Equipment:   Intra-op Plan:   Post-operative Plan:   Informed Consent: I have reviewed the patients History and Physical, chart, labs and discussed the procedure including the risks, benefits and alternatives for the proposed anesthesia with the patient or authorized representative who has indicated his/her understanding and acceptance.     Plan Discussed with: CRNA, Anesthesiologist and Surgeon  Anesthesia Plan Comments:         Anesthesia Quick Evaluation

## 2016-04-04 NOTE — Op Note (Signed)
    OPERATIVE REPORT  DATE OF SURGERY: 04/04/2016  PATIENT: Ronald Simpson, 70 y.o. male MRN: [id number]  DOB: 1945-05-23  PRE-OPERATIVE DIAGNOSIS: Gangrene left fourth and possible fifth toe  POST-OPERATIVE DIAGNOSIS:  Same  PROCEDURE: Open amputation of left fourth and fifth toe including metatarsal head  SURGEON:  Gretta Beganodd Early, M.D.  PHYSICIAN ASSISTANT: Nurse  ANESTHESIA:  Gen.  EBL: Minimal ml  Total I/O In: 1050 [I.V.:800; IV Piggyback:250] Out: 10 [Blood:10]  BLOOD ADMINISTERED: None  DRAINS: None  SPECIMEN: None  COUNTS CORRECT:  YES  PLAN OF CARE: PACU   PATIENT DISPOSITION:  PACU - hemodynamically stable  PROCEDURE DETAILS: Patient was taken to the operative placed supine position the area of the left foot February sterile fashion there was a dry gangrene of the fourth toe with erythema including down into the metatarsal head area and base of the fourth toe. Incision made at the base of the fourth toe and there was obvious necrotic tissue that extended down through the tendons into the metatarsal head. On further debridement this became quite close to the third metatarsal head but no frank necrosis there and there was obvious frank necrosis involving the fifth metatarsal head. The skin was not viable on the plantar aspect over the metatarsal head of the fourth and the all nonviable skin was removed as well. The metatarsal bones were divided with the Roger and debrided further back in the area of the subcutaneous tissue. There was no other area of purulence. The obviously nonviable tissue was sharply debrided the wound was irrigated with saline and hemostasis tablet cautery. The wound was packed with Betadine soaked 4 x 4 and the patient was transferred to the recovery room in stable condition   Larina Earthlyodd F. Early, M.D., [facility] 04/04/2016 3:42 PM

## 2016-04-04 NOTE — Anesthesia Procedure Notes (Signed)
Procedure Name: LMA Insertion Date/Time: 04/04/2016 2:15 PM Performed by: Fransisca KaufmannMEYER, Mykela Mewborn E Pre-anesthesia Checklist: Patient identified, Emergency Drugs available, Suction available and Patient being monitored Patient Re-evaluated:Patient Re-evaluated prior to inductionOxygen Delivery Method: Circle System Utilized Preoxygenation: Pre-oxygenation with 100% oxygen Intubation Type: IV induction Ventilation: Mask ventilation without difficulty LMA: LMA inserted LMA Size: 5.0 Number of attempts: 1 Airway Equipment and Method: Bite block Placement Confirmation: positive ETCO2 Tube secured with: Tape Dental Injury: Teeth and Oropharynx as per pre-operative assessment

## 2016-04-04 NOTE — Progress Notes (Signed)
Internal Medicine Attending  Date: 04/04/2016  Patient name: Ronald Simpson Medical record number: [id number] Date of birth: [birth year] Age: 70 y.o. Gender: male  I saw and evaluated the patient. I discussed the assessment and plan with Dr. Karma GreaserBoswell and his note is to follow with the details.  When seen on rounds this morning Mr. [name] was without new complaints. He subsequently underwent amputation of the fourth and fifth toes of the left foot. There did not appear to be other infected tissue. We will await vascular surgery's postoperative assessment tomorrow to see if they feel he would be safe for discharge back to his skilled nursing facility. As his yeast urinary tract infection is asymptomatic, he is not neutropenic, and there has been no urinary tract manipulation it does not require therapy at this time.

## 2016-04-04 NOTE — Progress Notes (Signed)
Patient ID: Ronald Simpson, male   DOB: 09/18/1945, 70 y.o.   MRN: [id number] Comfortable this morning. No change in his foot. Does have dry gangrene with some erythema at the base and some over his metatarsal head. Discussed CT scan findings with the gassiness fourth toe and obvious osteopenia. No evidence of fluid in his foot. Will take a the operating room today for open fourth toe amputation. Also has an eschar over the second toe tip. We will debride this as well. Understands we may find more infection and will probably treat this at the time of surgery. Continues to have palpable popliteal pulse. 

## 2016-04-04 NOTE — Anesthesia Postprocedure Evaluation (Signed)
Anesthesia Post Note  Patient: Ronald Simpson  Procedure(s) Performed: Procedure(s) (LRB): LEFT FOURTH AND FIFTH  TOE AMPUTATION (Left)  Patient location during evaluation: PACU Anesthesia Type: General Level of consciousness: awake and alert and patient cooperative Pain management: pain level controlled Vital Signs Assessment: post-procedure vital signs reviewed and stable Respiratory status: spontaneous breathing and respiratory function stable Cardiovascular status: stable Anesthetic complications: no       Last Vitals:  Vitals:   04/04/16 1550 04/04/16 1602  BP: 114/78 105/67  Pulse: 87 89  Resp: 15 18  Temp:  36.9 C    Last Pain:  Vitals:   04/04/16 1600  TempSrc:   PainSc: Asleep                 Colandra Ohanian S

## 2016-04-04 NOTE — Progress Notes (Signed)
Patient arrived back from PACU, left fourth and fifth digits amputated, wrapped in gauze, mild pain, alert and oriented, hooked to IV fluids. Received report from PACU nurse at bedside, VSS, tele connected back. Will continue to monitor.

## 2016-04-04 NOTE — Progress Notes (Signed)
Pt refused CPAP for the night.   

## 2016-04-04 NOTE — Interval H&P Note (Signed)
History and Physical Interval Note:  04/04/2016 1:45 PM  Ronald Simpson  has presented today for surgery, with the diagnosis of Wound  The various methods of treatment have been discussed with the patient and family. After consideration of risks, benefits and other options for treatment, the patient has consented to  Procedure(s): LEFT FOURTH TOE AMPUTATION (Left) as a surgical intervention .  The patient's history has been reviewed, patient examined, no change in status, stable for surgery.  I have reviewed the patient's chart and labs.  Questions were answered to the patient's satisfaction.     Gretta BeganEarly, Noreta Kue

## 2016-04-05 ENCOUNTER — Encounter (HOSPITAL_COMMUNITY): Payer: Self-pay | Admitting: Vascular Surgery

## 2016-04-05 DIAGNOSIS — R4182 Altered mental status, unspecified: Secondary | ICD-10-CM

## 2016-04-05 DIAGNOSIS — R339 Retention of urine, unspecified: Secondary | ICD-10-CM

## 2016-04-05 DIAGNOSIS — N179 Acute kidney failure, unspecified: Secondary | ICD-10-CM

## 2016-04-05 LAB — CBC
HEMATOCRIT: 29 % — AB (ref 39.0–52.0)
HEMOGLOBIN: 8.8 g/dL — AB (ref 13.0–17.0)
MCH: 28.6 pg (ref 26.0–34.0)
MCHC: 30.3 g/dL (ref 30.0–36.0)
MCV: 94.2 fL (ref 78.0–100.0)
Platelets: 217 10*3/uL (ref 150–400)
RBC: 3.08 MIL/uL — ABNORMAL LOW (ref 4.22–5.81)
RDW: 16 % — ABNORMAL HIGH (ref 11.5–15.5)
WBC: 10.3 10*3/uL (ref 4.0–10.5)

## 2016-04-05 LAB — BASIC METABOLIC PANEL
ANION GAP: 7 (ref 5–15)
BUN: 38 mg/dL — ABNORMAL HIGH (ref 6–20)
CO2: 22 mmol/L (ref 22–32)
Calcium: 8.5 mg/dL — ABNORMAL LOW (ref 8.9–10.3)
Chloride: 107 mmol/L (ref 101–111)
Creatinine, Ser: 2.23 mg/dL — ABNORMAL HIGH (ref 0.61–1.24)
GFR calc Af Amer: 33 mL/min — ABNORMAL LOW (ref >60)
GFR, EST NON AFRICAN AMERICAN: 29 mL/min — AB (ref >60)
GLUCOSE: 181 mg/dL — AB (ref 65–99)
POTASSIUM: 4.7 mmol/L (ref 3.5–5.1)
Sodium: 136 mmol/L (ref 135–145)

## 2016-04-05 LAB — URINALYSIS, ROUTINE W REFLEX MICROSCOPIC
Bilirubin Urine: NEGATIVE
Glucose, UA: NEGATIVE mg/dL
KETONES UR: NEGATIVE mg/dL
NITRITE: NEGATIVE
PROTEIN: 100 mg/dL — AB
Specific Gravity, Urine: 1.029 (ref 1.005–1.030)
pH: 5 (ref 5.0–8.0)

## 2016-04-05 LAB — GLUCOSE, CAPILLARY
GLUCOSE-CAPILLARY: 193 mg/dL — AB (ref 65–99)
GLUCOSE-CAPILLARY: 147 mg/dL — AB (ref 65–99)
GLUCOSE-CAPILLARY: 115 mg/dL — AB (ref 65–99)
Glucose-Capillary: 211 mg/dL — ABNORMAL HIGH (ref 65–99)

## 2016-04-05 LAB — PROTIME-INR
INR: 1.98
Prothrombin Time: 22.8 seconds — ABNORMAL HIGH (ref 11.4–15.2)

## 2016-04-05 MED ORDER — WARFARIN SODIUM 3 MG PO TABS
3.0000 mg | ORAL_TABLET | Freq: Once | ORAL | Status: AC
Start: 2016-04-05 — End: 2016-04-05
  Administered 2016-04-05: 3 mg via ORAL
  Filled 2016-04-05 (×2): qty 1

## 2016-04-05 MED ORDER — DEXTROSE 5 % IV SOLN
1.0000 g | INTRAVENOUS | Status: DC
  Administered 2016-04-05 – 2016-04-07 (×3): 1 g via INTRAVENOUS
  Filled 2016-04-05 (×3): qty 10

## 2016-04-05 MED ORDER — SODIUM CHLORIDE 0.9 % IV SOLN
INTRAVENOUS | Status: AC
  Administered 2016-04-05: 20:00:00 via INTRAVENOUS

## 2016-04-05 NOTE — Progress Notes (Signed)
Patient continues to refuse CPAP.  Patient states that he does not wear one at home.

## 2016-04-05 NOTE — Progress Notes (Signed)
Patient bladder scanned due to limited output over night. Read 900 cc fluid. MD contacted and awaiting call back. Will continue to monitor.

## 2016-04-05 NOTE — Progress Notes (Signed)
Ordered to place foley catheter in patient due to urinary retention. Upon inserting foley patient returned 200 mL of urine in foley bag but there was resistant when inserting 10 cc of water in the balloon. Removed foley catheter. Urinalysis was collected and sent to lab. Report given to Fleet Contrasachel, RN who will attempt to insert foley a second time.

## 2016-04-05 NOTE — Progress Notes (Signed)
Orthopedic Tech Progress Note Patient Details:  [name] Smolenski November 27, 1945 [id number]  Ortho Devices Type of Ortho Device: Arm sling Ortho Device/Splint Location: lue Ortho Device/Splint Interventions: Application   Nikki DomCrawford, Lenaya Pietsch 04/05/2016, 12:10 PM Viewed order from rn order list

## 2016-04-05 NOTE — Progress Notes (Signed)
   VASCULAR SURGERY ASSESSMENT & PLAN:  1 Day Post-Op s/p: Open 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 amps.  Continue daily dressing changes.  Cont IV Rocephin.   No options for revascularization. (Peroneal runoff only).  SUBJECTIVE: Pain well controlled  PHYSICAL EXAM: Vitals:   04/04/16 1602 04/04/16 1634 04/04/16 2135 04/05/16 0600  BP: 105/67 114/72 125/76 114/67  Pulse: 89  83 75  Resp: 18 18 18 18   Temp: 98.4 F (36.9 C) 97.5 F (36.4 C) 99 F (37.2 C) 98.4 F (36.9 C)  TempSrc:   Oral Oral  SpO2: 95% 99% 92% 96%  Weight:      Height:       Dressing was just changed by nurse. She reports some serosanguinous drainage.  Mild cellulitis.  LABS: Lab Results  Component Value Date   WBC 10.3 04/05/2016   HGB 8.8 (L) 04/05/2016   HCT 29.0 (L) 04/05/2016   MCV 94.2 04/05/2016   PLT 217 04/05/2016   Lab Results  Component Value Date   CREATININE 2.23 (H) 04/05/2016   Lab Results  Component Value Date   INR 1.98 04/05/2016   PROTIME 92.8 (A) 03/31/2016   CBG (last 3)   Recent Labs  04/04/16 1654 04/04/16 2134 04/05/16 0756  GLUCAP 172* 138* 193*    Active Problems:   Hepatitis C without hepatic coma   Type 2 diabetes mellitus with diabetic peripheral angiopathy without gangrene, without long-term current use of insulin (HCC)   Benign essential HTN   CAD in native artery   Paroxysmal atrial fibrillation (HCC)   PAD (peripheral artery disease) (HCC)   Peripheral neuropathy (HCC)   Gangrene associated with diabetes mellitus (HCC)   Somnolence   Cystitis   Gangrene of toe of left foot [facility](HCC)    Cari CarawayChris Aza Dantes Beeper: [phone]: [phone] 04/05/2016

## 2016-04-05 NOTE — Progress Notes (Signed)
Internal Medicine Attending:   I saw and examined the patient. I reviewed the resident's note and I agree with the resident's findings and plan as documented in the resident's note. Patient underwent transmetatarsal amputation yesterday.  This morning nursing staff concerned for increased confusion and decreased UOP.  Bladderscan was obtained which revealed 900cc in bladder.  On my evaluation Ronald Simpson is AAOx4, able to preform simple calculation however unable to spell World backwards.  He has an obese abdomen that is non tender, normoactive bowel sounds.  Left foot is bandaged and c/d/i. Will plan to continue ceftriaxone as outlined by Dr Vincente LibertyMolt.  We will place a foley given concern for bladder stretch injury, I suspect his urinary retention is due to pain medication/ post op but he did have leukocytosis on his prior U/A.  UTI should have been well covered by his broad spectrum antibiotics however we will also repeat a UA and urine culture.

## 2016-04-05 NOTE — Progress Notes (Signed)
ANTICOAGULATION CONSULT NOTE - Follow Up Consult  Pharmacy Consult for coumadin Indication: atrial fibrillation  No Known Allergies  Patient Measurements: Height: 5\' 10"  (177.8 cm) Weight: 187 lb 6.3 oz (85 kg) IBW/kg (Calculated) : 73 Heparin Dosing Weight:   Vital Signs: Temp: 98.4 F (36.9 C) (12/23 0600) Temp Source: Oral (12/23 0600) BP: 124/69 (12/23 0844) Pulse Rate: 75 (12/23 0600)  Labs:  Recent Labs  04/02/16 1530 04/03/16 0445 04/04/16 0356 04/05/16 0423  HGB  --   --  9.0* 8.8*  HCT  --   --  28.8* 29.0*  PLT  --   --  213 217  LABPROT  --  20.1*  --  22.8*  INR  --  1.69  --  1.98  CREATININE 1.69*  --  1.74* 2.23*    Estimated Creatinine Clearance: 32.7 mL/min (by C-G formula based on SCr of 2.23 mg/dL (H)).   Medical History: Past Medical History:  Diagnosis Date  . CAD (coronary artery disease)    01/15/16 he states he has had a heart attack  . CVA (cerebral infarction)   . DM (diabetes mellitus), type 2 with peripheral vascular complications (HCC)   . History of hepatitis B   . History of hepatitis C   . Peripheral vascular disease (HCC)     Medications:  Scheduled:  . atorvastatin  40 mg Oral Daily  . carvedilol  6.25 mg Oral BID WC  . cefTRIAXone (ROCEPHIN)  IV  1 g Intravenous Q24H  . Chlorhexidine Gluconate Cloth  6 each Topical Q0600  . hydrALAZINE  25 mg Oral TID  . insulin aspart  0-5 Units Subcutaneous QHS  . insulin aspart  0-9 Units Subcutaneous TID WC  . insulin glargine  14 Units Subcutaneous QHS  . levothyroxine  25 mcg Oral QAC breakfast  . mupirocin ointment  1 application Nasal BID  . nicotine  14 mg Transdermal Daily  . Warfarin - Pharmacist Dosing Inpatient   Does not apply q1800   Infusions:  . sodium chloride 50 mL/hr at 04/05/16 [id number]   Assessment: 70 yo male with hx of afib will be resumed on coumadin.  INR yesterday was 1.69.  Home coumadin dose was 6 mg po daily but admitting INR was 11.8.  Today he is  trending back toward goal after just one dose of 3mg .  INR - 1.98.  H/H is down slightly 8.8/29.0 but no noted bleeding.  Goal of Therapy:  INR 2-3 Monitor platelets by anticoagulation protocol: Yes   Plan:  Repeat coumadin 3 mg po x1 Check INR in am  Nadara MustardNita Kiyan Burmester, PharmD., MS Clinical Pharmacist Pager:  [phone] Thank you for allowing pharmacy to be part of this patients care team. 04/05/2016,10:46 AM

## 2016-04-05 NOTE — Progress Notes (Signed)
Subjective: [name] was seen and evaluated today at bedside. Reports he is feeling confused and that "something isnt right." He otherwise reports his pain is well-controlled. He denied any SOB, CP, abd pain, nausea, vomiting or dysuria. Was notified by RN that patient had 900 cc urinary retention.   Objective:  Vital signs in last 24 hours: Vitals:   04/04/16 1634 04/04/16 2135 04/05/16 0600 04/05/16 0844  BP: 114/72 125/76 114/67 124/69  Pulse:  83 75   Resp: 18 18 18    Temp: 97.5 F (36.4 C) 99 F (37.2 C) 98.4 F (36.9 C)   TempSrc:  Oral Oral   SpO2: 99% 92% 96%   Weight:      Height:       CBC Latest Ref Rng & Units 04/05/2016 04/04/2016 04/02/2016  WBC 4.0 - 10.5 K/uL 10.3 12.0(H) 12.9(H)  Hemoglobin 13.0 - 17.0 g/dL 7.8(G8.8(L) 9.5(A9.0(L) 2.1(H9.1(L)  Hematocrit 39.0 - 52.0 % 29.0(L) 28.8(L) 28.9(L)  Platelets 150 - 400 K/uL 217 213 185   CMP Latest Ref Rng & Units 04/05/2016 04/04/2016 04/02/2016  Glucose 65 - 99 mg/dL 086(V181(H) 784(O188(H) 962(X154(H)  BUN 6 - 20 mg/dL 52(W38(H) 41(L39(H) 24(M45(H)  Creatinine 0.61 - 1.24 mg/dL 0.10(U2.23(H) 7.25(D1.74(H) 6.64(Q1.69(H)  Sodium 135 - 145 mmol/L 136 135 140  Potassium 3.5 - 5.1 mmol/L 4.7 4.3 4.7  Chloride 101 - 111 mmol/L 107 108 111  CO2 22 - 32 mmol/L 22 20(L) 22  Calcium 8.9 - 10.3 mg/dL 0.3(K8.5(L) 8.3(L) 8.7(L)  Total Protein 6.5 - 8.1 g/dL - - -  Total Bilirubin 0.3 - 1.2 mg/dL - - -  Alkaline Phos 38 - 126 U/L - - -  AST 15 - 41 U/L - - -  ALT 17 - 63 U/L - - -   General: Chronically-ill appearing african [place] male. Resting comfortably in bed. In no acute distress. Said at end of exam "something doesn't feel right" HENT: Normocephalic and atraumatic. EOM-I. No conjunctival injection, icterus or ptosis. Exophthalmos.  Cardiovascular: Regular rate however irregular rhythm. No murmur or rub appreciated. Pulmonary: CTA BL on anterior chest. Unlabored breathing.  Abdomen: Soft, non-tender and perhaps mild lower abdominal distention. No guarding or rigidity.  +bowel sounds.  Extremities: Right AKA, well-healed incision. LLE wrapped in clean, dry dressings up to ankle. No erythema extending above bandage to suggest progressing dx. Extremity cool to touch.  Skin: Dry. No cyanosis, suspicious rashes. Neuro: Alert and oriented x3. Slow to answer questions however mostly answered appropriately. Pt appeared fatigued on examination.  Psych: Speech without evidence for hallucinations or delusions.   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 Surgery report did not note any complications. Afebrile overnight and AM labs without leukocytosis. Per nursing note, during dressing change this AM there was some serosanguinous discharge however no purulence. Vascular surgery continues to follow and recommends continuing IV Rocephin at this time and continuing daily dressing changes, we appreciate their recommendations.  -Continue  IV Rocephin -Daily dressing changes -Pain well controlled with IV Dilaudid 0.5 mg however in setting of patients confusion will change therapy. D/c dilaudid and will continue PO Oxy 5 mg  Altered Mental Status This was present on admission and was thought to be medication-induced as mental status improved with 2 doses of narcan. UDS was actually negative and pt reported his last dose of pain medication was 2 days prior to admission. Work-up so far indicated 2 infectious sources, urine and osteomyelitis. Source control  with amputation of digits for the osteomyelitis and patient has been on broad spec abx for several days which should cover any urinary tract infection. That being said, the patient denied any symptoms consistent with UTI except for AMS. Today the patient appeared fatigue and had slow mentation however was oriented x3. Appeared confused however. Pt had only received a small dose of IV Dilaudid 4 hours prior to our examination, doubt this as a significant cause. At this point, suspect UTI underlying cause of AMS and will obtain repeat UA + culture, and will continue abx with IV Rocephin.  -Continue to monitor pts mental status  UTI, AKI As described above. Cr increased today at 2.2 from baseline of 1.8. Pt does not have hx of known HF. Will gently rehydrate with IVF.  -UA + culture. Cx from 12/20 grew yeast only.  -Foley  -Consider treating yeast with Fluconazole if pt does not improve clinically.   Acute Urinary Retention RN noted 900 cc retained urine on bladder scan this morning. Management as above.  Supratherapeutic INR, PAF Currently 1.98, was >10 on admission. S/p  IV Vit K in ED with prompt correction. Received 1 dose of 3 mg Warfarin yesterday with some improvement. Goal 2-3.  -Pharmacy dosing Warfarin, pt appears to be responding appropriately -repeat INR tomorrow AM  Dispo: Anticipated discharge in approximately 1-2 days. Was anticipated to be d/c today however will not d/c with pts continued AMS. Pt will return to SNF [facility]) upon discharge.   Ronald Mansel, DO 04/05/2016, 10:40 AM Pager: [phone]

## 2016-04-06 ENCOUNTER — Inpatient Hospital Stay (HOSPITAL_COMMUNITY): Payer: Medicare Other

## 2016-04-06 LAB — CBC
HEMATOCRIT: 28.6 % — AB (ref 39.0–52.0)
HEMOGLOBIN: 8.8 g/dL — AB (ref 13.0–17.0)
MCH: 28.7 pg (ref 26.0–34.0)
MCHC: 30.8 g/dL (ref 30.0–36.0)
MCV: 93.2 fL (ref 78.0–100.0)
Platelets: 234 10*3/uL (ref 150–400)
RBC: 3.07 MIL/uL — ABNORMAL LOW (ref 4.22–5.81)
RDW: 15.9 % — AB (ref 11.5–15.5)
WBC: 12.2 10*3/uL — AB (ref 4.0–10.5)

## 2016-04-06 LAB — BASIC METABOLIC PANEL
ANION GAP: 7 (ref 5–15)
BUN: 38 mg/dL — ABNORMAL HIGH (ref 6–20)
CALCIUM: 8.3 mg/dL — AB (ref 8.9–10.3)
CHLORIDE: 109 mmol/L (ref 101–111)
CO2: 19 mmol/L — AB (ref 22–32)
Creatinine, Ser: 2.38 mg/dL — ABNORMAL HIGH (ref 0.61–1.24)
GFR calc non Af Amer: 26 mL/min — ABNORMAL LOW (ref >60)
GFR, EST AFRICAN AMERICAN: 31 mL/min — AB (ref >60)
Glucose, Bld: 139 mg/dL — ABNORMAL HIGH (ref 65–99)
Potassium: 4.7 mmol/L (ref 3.5–5.1)
Sodium: 135 mmol/L (ref 135–145)

## 2016-04-06 LAB — GLUCOSE, CAPILLARY
GLUCOSE-CAPILLARY: 166 mg/dL — AB (ref 65–99)
GLUCOSE-CAPILLARY: 120 mg/dL — AB (ref 65–99)
Glucose-Capillary: 153 mg/dL — ABNORMAL HIGH (ref 65–99)

## 2016-04-06 LAB — PROTIME-INR
INR: >10
INR: 2.21
Prothrombin Time: >90 seconds — ABNORMAL HIGH (ref 11.4–15.2)
Prothrombin Time: 24.9 seconds — ABNORMAL HIGH (ref 11.4–15.2)

## 2016-04-06 LAB — PROTEIN / CREATININE RATIO, URINE
CREATININE, URINE: 200.81 mg/dL
Protein Creatinine Ratio: 1.65 mg/mg{Cre} — ABNORMAL HIGH (ref 0.00–0.15)
TOTAL PROTEIN, URINE: 332 mg/dL

## 2016-04-06 MED ORDER — FUROSEMIDE 40 MG PO TABS
40.0000 mg | ORAL_TABLET | Freq: Every day | ORAL | Status: DC
  Administered 2016-04-06 – 2016-04-08 (×3): 40 mg via ORAL
  Filled 2016-04-06 (×3): qty 1

## 2016-04-06 MED ORDER — WARFARIN SODIUM 3 MG PO TABS
3.0000 mg | ORAL_TABLET | Freq: Once | ORAL | Status: AC
  Administered 2016-04-06: 3 mg via ORAL
  Filled 2016-04-06: qty 1

## 2016-04-06 MED ORDER — HYDROMORPHONE HCL 2 MG/ML IJ SOLN
0.5000 mg | INTRAMUSCULAR | Status: DC | PRN
  Administered 2016-04-06 – 2016-04-07 (×3): 0.5 mg via INTRAVENOUS
  Filled 2016-04-06 (×3): qty 1

## 2016-04-06 MED ORDER — HYDROMORPHONE HCL 2 MG/ML IJ SOLN
0.5000 mg | Freq: Once | INTRAMUSCULAR | Status: AC
  Administered 2016-04-06: 0.5 mg via INTRAVENOUS
  Filled 2016-04-06: qty 1

## 2016-04-06 MED ORDER — HYDROMORPHONE HCL 2 MG/ML IJ SOLN: 0.5000 mg | INTRAMUSCULAR | Status: DC | PRN

## 2016-04-06 NOTE — Discharge Summary (Signed)
Name: Ronald Simpson MRN: [id number] DOB: [birth year] 70 y.o. PCP: [name], MD  Date of Admission: 04/02/2016 10:15 AM Date of Discharge: 04/08/2016 Attending Physician: Gust Rung, DO  Discharge Diagnosis: 1. Dry gangrene of left extremity fourth and fifth digit, status post amputation 2. Altered mental status 3. Scrotal edema 4. Acute kidney injury 5. Supratherapeutic INR 6. Paroxysmal atrial fibrillation Active Problems:   Hepatitis C without hepatic coma   Type 2 diabetes mellitus with diabetic peripheral angiopathy without gangrene, without long-term current use of insulin (HCC)   AKI (acute kidney injury) (HCC)   Benign essential HTN   CAD in native artery   Paroxysmal atrial fibrillation (HCC)   PAD (peripheral artery disease) (HCC)   Peripheral neuropathy (HCC)   Gangrene associated with diabetes mellitus (HCC)   Somnolence   Cystitis   Gangrene of toe of left foot (HCC)   Discharge Medications: Allergies as of 04/08/2016   No Known Allergies     Medication List    STOP taking these medications   amoxicillin-clavulanate 875-125 MG tablet Commonly known as:  AUGMENTIN     TAKE these medications   acetaminophen 325 MG tablet Commonly known as:  TYLENOL Take 650 mg by mouth every 6 (six) hours as needed for mild pain, moderate pain or fever.   amitriptyline 25 MG tablet Commonly known as:  ELAVIL Take 25 mg by mouth at bedtime.   atorvastatin 40 MG tablet Commonly known as:  LIPITOR Take 40 mg by mouth daily.   carvedilol 6.25 MG tablet Commonly known as:  COREG Take 6.25 mg by mouth 2 (two) times daily with a meal.   docusate sodium 100 MG capsule Commonly known as:  COLACE Take 1 capsule (100 mg total) by mouth 2 (two) times daily. What changed:  when to take this   ferrous sulfate 325 (65 FE) MG tablet Take 325 mg by mouth 2 (two) times daily with a meal.   furosemide 40 MG tablet Commonly known as:  LASIX Take 40 mg by  mouth daily.   hydrALAZINE 25 MG tablet Commonly known as:  APRESOLINE Take 25 mg by mouth 3 (three) times daily.   insulin glargine 100 unit/mL Sopn Commonly known as:  LANTUS Inject 17 Units into the skin at bedtime.   ipratropium-albuterol 0.5-2.5 (3) MG/3ML Soln Commonly known as:  DUONEB Take 3 mLs by nebulization every 6 (six) hours as needed.   isosorbide mononitrate 30 MG 24 hr tablet Commonly known as:  IMDUR Take 1 tablet (30 mg total) by mouth daily.   levothyroxine 25 MCG tablet Commonly known as:  SYNTHROID, LEVOTHROID Take 25 mcg by mouth daily before breakfast.   nicotine 14 mg/24hr patch Commonly known as:  NICODERM CQ - dosed in mg/24 hours Place 14 mg onto the skin daily.   nitroGLYCERIN 0.4 MG SL tablet Commonly known as:  NITROSTAT Place 0.4 mg under the tongue every 5 (five) minutes as needed for chest pain.   oxyCODONE 5 MG immediate release tablet Commonly known as:  Oxy IR/ROXICODONE Take 5 mg by mouth every 12 (twelve) hours as needed for severe pain.   OXYGEN Inhale into the lungs. 2lpm via  prn for shortness of breath or O2 sat <90%   pregabalin 100 MG capsule Commonly known as:  LYRICA Take 100 mg by mouth every 8 (eight) hours.   traMADol 50 MG tablet Commonly known as:  ULTRAM Take 50 mg by mouth every 8 (eight) hours as needed.  warfarin 6 MG tablet Commonly known as:  COUMADIN Take 6 mg by mouth daily at 6 PM.       Disposition and follow-up:   Mr.Casmer [name] [facility] St[facility].  At the hospital follow up visit please address:  1.  Wound VAC placement and follow-up with wound care. Mental status, and as it relates to narcotic pain medication administration. Scrotal edema and results of 24-hour urine creatinine. Would benefit from nephrology  2.  Labs / imaging needed at time of follow-up: INR, BMET 3-4 days to monitor renal function with increased diuresis  3.  Pending  labs/ test needing follow-up: 24-hour urine creatinine   Hospital Course by problem list: Active Problems:   Hepatitis C without hepatic coma   Type 2 diabetes mellitus with diabetic peripheral angiopathy without gangrene, without long-term current use of insulin (HCC)   AKI (acute kidney injury) (HCC)   Benign essential HTN   CAD in native artery   Paroxysmal atrial fibrillation (HCC)   PAD (peripheral artery disease) (HCC)   Peripheral neuropathy (HCC)   Gangrene associated with diabetes mellitus (HCC)   Somnolence   Cystitis   Gangrene of toe of left foot (HCC)   1. Dry gangrene of left lower extremity fourth and fifth digit, status post amputation Mr. [name] is a 70 year old male with medical history of hypertension, type 2 diabetes, coronary artery disease status-post CABG, paroxysmal atrial fibrillation, and hepatitis B & C who presented 04/02/16 from [place] for evaluation of altered mental status. On initial examination he was noted to have gangrene of his left fourth toe and CT of the left foot showed osteomyelitis of the fourth phalanx. He subsequently underwent amputation of the left fourth and fifth digit with vascular surgery on 12/22 without complication other than acute urinary retention requiring Foley placement. His pain was well controlled with oral narcotics and was maintained in the hospital for placement of wound VAC.   Altered mental status The patient was initially brought to the emergency department as he had been increasingly lethargic for the past several days at his skilled nursing facility with acute worsening day of presentation. On examination he was difficult to arouse however responded immediately to 2 doses of Narcan. After the patient regained clarity he reported that the pain medication was making him hallucinat out a lot of e and feel strange. During his hospitalization his mental status improved with the escalating his doses of narcotic pain medication.  His mental status seemed to be stable on oxycodone IR 5 mg or 10 mg.   Urinary retention, Scrotal swelling Scrotal swelling developed after administration of approximately 1.5 L of IV fluids for treatment of acute kidney injury. Chart review shows that this has happened previously after administration of IV fluids. Workup for this included evaluating the cause of the patient's hypoalbuminemia including urine protein-creatinine ratio and a 24-hour urine collection. The urine protein creatinine ratio was elevated to the degree that the estimated protein loss per day was 2.6 grams suggesting underlying renal dysfunction/nephrotic disease. This workup will need to be continued as an outpatient.  Supratherapeutic INR, paroxysmal atrial fibrillation INR check from day prior to admission at facility was 11.7. This is treated with vitamin K in the emergency department with appropriate response. With the assistance of pharmacy, At time of discharge his INR was 3.2.  Acute kidney injury Initially suspected patient may have UTI as UA demonstrated leukocytes and white blood cells and was given several  days of IV ceftriaxone. 2 urine cultures were obtained, both of which grew yeast only after which time antibiotics were discontinued. Unlikely that this is a fungal urinary tract infection as patient has not had recent urologic surgical procedure.  Discharge Vitals:   BP 136/73 (BP Location: Left Arm)   Pulse 89   Temp 98.5 F (36.9 C) (Oral)   Resp 18   Ht 5\' 10"  (1.778 m)   Wt 187 lb 6.3 oz (85 kg)   SpO2 100%   BMI 26.89 kg/m   Pertinent Labs, Studies, and Procedures:  CT left foot: Osteomyelitis of fourth phalanx Urine cultures 2: Greater than 100,000 yeast Scrotal ultrasound: No evidence for testicular torsion. Scrotal edema Spot urine creatinine ratio elevated, will need follow-up 24-hour urine creatinine   Discharge Instructions: PATIENT REQUIRES WOUND-VAC PLACEMENT.  PLEASE TAKE lasix twice  daily until scrotal swelling improves. Then, resume usual home dose of once daily. Please be sure to check BMET to monitor renal function.   Please follow-up with vascular surgery (Dr. early) January 10 at 9:30 AM.   Signed: Noemi ChapelBethany Porchia Sinkler, DO 04/06/2016, 6:35 PM   Pager: [phone]

## 2016-04-06 NOTE — Progress Notes (Addendum)
Subjective: Ronald Simpson was seen andJudithann Graves evaluated today at bedside. Patient reports that overnight he developed painful scrotal swelling. Reports he's had this once before "one year ago" and it was due to some infection. He denies any fevers, chills, nausea, vomiting and reports his pain from his amputations are well controlled however requests IV pain medications for his scrotal pain.  Objective:  Vital signs in last 24 hours: Vitals:   04/05/16 1216 04/05/16 1405 04/05/16 2210 04/06/16 0610  BP: (!) 124/91 134/71 130/80 127/72  Pulse: 78 78 79 86  Resp: 20 18 18 18   Temp:  98.5 F (36.9 C) 99 F (37.2 C) 99.2 F (37.3 C)  TempSrc:  Oral Oral Oral  SpO2: 97% 100% 96% 99%  Weight:      Height:       General: Chronically-ill appearing african [place] male. Resting comfortably in bed yet complains of scrotal discomfort.  HENT: normocephalic, atraumatic. EOMI. No conjunctival injection, icterus or ptosis. Exophthalmos. Cardiovascular: Irregularly irregular. No murmur or rub appreciated. Pulmonary: CTA BL on anterior chest, no wheezing, crackles or rhonchi appreciated. Unlabored breathing.  Abdomen: Soft, non-tender and non-distended. No guarding or rigidity. +bowel sounds.  GU: Significant scrotal swelling, new from yesterdays examination. Does not appear to be significantly tender. Foley catheter in place.  Extremities: right AKA, left foot wrapped in clean, dry dressings. Skin: Warm, dry Psych: Mood normal and affect was mood congruent.   Assessment/Plan:  Active Problems:   Hepatitis C without hepatic coma   Type 2 diabetes mellitus with diabetic peripheral angiopathy without gangrene, without long-term current use of insulin (HCC)   AKI (acute kidney injury) (HCC)   Benign essential HTN   CAD in native artery   Paroxysmal atrial fibrillation (HCC)   PAD (peripheral artery disease) (HCC)   Peripheral neuropathy (HCC)   Gangrene associated with diabetes mellitus (HCC)  Somnolence   Cystitis   Gangrene of toe of left foot (HCC)  Gangrene of left foot, status post amputation of left fourth and fifth digit Patient is 2 days status-post transmetatarsal amputation of left fourth and fifth digits. Site was examined during dressing change and was without purulent drainage. Patient reports that his pain from the amputation site is controlled on current regimen. Surg will likely place wound-vac to expedite healing -F/u recs from surg  Altered Mental Status Not altered during todays examination. Will monitor  UTI, AKI, Acute Urinary Retention Baseline cr 1.8, 2.4 today, up from 2.2 yesterday despite fluid administration.  -UA with rare bacteria and moderate leukocytes  -Foley still in place -Awaiting results of repeat urine culture, previously grew yeast -Continue IV Ceftriaxone until ucx results  Scrotal swelling  Chart review showed that patient actually had the scrotal swelling approximately 2 months ago during treatment for sepsis secondary to UTI. His significant scrotal edema was thought to be due to fluid overload secondary to patient's suspected underlying cirrhosis and was diuresed for several days before being discharged on an increased Lasix dose of 60 mg by mouth daily and continuation of spironolactone. Chart review shows only a prescription for Lasix 40 mg by mouth daily which was not continued on admission as patient reports he has not been taking this medication. Spironolactone does not appear on his home medication list either. Pt does have low albumin (2.7 on adm however without other liver fxn abn.) Patient did receive slow rehydration yesterday ordered by our team with IVF at 100 mL/h 10 hours for acute kidney injury. Chart review shows  that [place] ordered continuous IV fluids last night at a rate of 50 mL per hour. In total, he received 1.5 L of IVF over 24 hrs.Today the patient's renal function has worsened with creatinine at 2.38, up from 2.23  yesterday, up from baseline of 1.8. -Scrotal US showed small BL hydroceles + diffuse scrotal skin thickening and edema. Negative for torsion. -Hold IV fluids, suspect patient's significant scrotal swelling secondary to volume overload -Daily weights, strict ins and outs -Will diurese PO with 40 mg Lasix daily -1 x dose of IV Dilaudid 0.5 mg, encourage by mouth pain control following this -work-up hypoalbuminemia with spot protein and creatinine followed by a 24 hr urine creatinine evaluation.  Supratherapeutic INR, PAF INR 2.2 today. Continue warfarin dosing per pharmacy.  Dispo: Anticipated discharge once cleared for d/c by vascular surgery.   Jola Critzer, DO 04/06/2016, 7:45 AM Pager: [phone]

## 2016-04-06 NOTE — Progress Notes (Signed)
Internal Medicine Attending:   I saw and examined the patient. I reviewed the resident's note and I agree with the resident's findings and plan as documented in the resident's note. Patient only complaint is scrotal swelling. On exam his scrotum is quite swollen especially compaired to yesterday, he now has a foley catheter placed.  His left foot surgical wound appears clean. I suspect his scrotal swelling is due to IVF and his hypoalbuminemia (albumin of 2.7 on admission).  We did obtain a scrotal ultrasound that showed no acute testicular abnormality.   The cause of his hypoalbuminemia is unknown.  A previous CT of his abdomen in September noted possible cirrhotic appearing liver.  He also has underlying renal disease. We will start his workup with urine studies checking protein and creatinine, if this is elevated we may proceed with a full 24 hours urine evaluation to further quantify renal losses. We spoke with dr Edilia Bodickson who plans for a surgical foam and wound vac to be placed.  Following this I suspect he may can be discharged back to his SNF.

## 2016-04-06 NOTE — Progress Notes (Signed)
   VASCULAR SURGERY ASSESSMENT & PLAN:  2 Days Post-Op s/p: Open amputation of left 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s. Wound is clean and has some granulation tissue. I have recommended placement of a negative pressure dressing. He could go back to SNF once this is approved.  On IV Rocephin.   SUBJECTIVE: No specific complaints.   PHYSICAL EXAM: Vitals:   04/05/16 1216 04/05/16 1405 04/05/16 2210 04/06/16 0610  BP: (!) 124/91 134/71 130/80 127/72  Pulse: 78 78 79 86  Resp: 20 18 18 18   Temp:  98.5 F (36.9 C) 99 F (37.2 C) 99.2 F (37.3 C)  TempSrc:  Oral Oral Oral  SpO2: 97% 100% 96% 99%  Weight:      Height:       Wound on left foot inspected. It is clean and starting to show some granulation tissue.   LABS: Lab Results  Component Value Date   WBC 12.2 (H) 04/06/2016   HGB 8.8 (L) 04/06/2016   HCT 28.6 (L) 04/06/2016   MCV 93.2 04/06/2016   PLT 234 04/06/2016   Lab Results  Component Value Date   CREATININE 2.38 (H) 04/06/2016   Lab Results  Component Value Date   INR 2.21 04/06/2016   PROTIME 92.8 (A) 03/31/2016   CBG (last 3)   Recent Labs  04/05/16 1659 04/05/16 2214 04/06/16 0810  GLUCAP 147* 115* 166*    Active Problems:   Hepatitis C without hepatic coma   Type 2 diabetes mellitus with diabetic peripheral angiopathy without gangrene, without long-term current use of insulin (HCC)   AKI (acute kidney injury) (HCC)   Benign essential HTN   CAD in native artery   Paroxysmal atrial fibrillation (HCC)   PAD (peripheral artery disease) (HCC)   Peripheral neuropathy (HCC)   Gangrene associated with diabetes mellitus (HCC)   Somnolence   Cystitis   Gangrene of toe of left foot [facility](HCC)    Cari CarawayChris Dickson Beeper: [phone]: [phone] 04/06/2016

## 2016-04-06 NOTE — Progress Notes (Signed)
ANTICOAGULATION CONSULT NOTE - Follow Up Consult  Pharmacy Consult for coumadin Indication: atrial fibrillation  No Known Allergies  Patient Measurements: Height: 5\' 10"  (177.8 cm) Weight: 187 lb 6.3 oz (85 kg) IBW/kg (Calculated) : 73 Heparin Dosing Weight:   Vital Signs: Temp: 99.2 F (37.3 C) (12/24 0610) Temp Source: Oral (12/24 0610) BP: 127/72 (12/24 0610) Pulse Rate: 86 (12/24 0610)  Labs:  Recent Labs  04/04/16 0356 04/05/16 0423 04/06/16 0524 04/06/16 0615  HGB 9.0* 8.8*  --  8.8*  HCT 28.8* 29.0*  --  28.6*  PLT 213 217  --  234  LABPROT  --  22.8* 24.9*  --   INR  --  1.98 2.21  --   CREATININE 1.74* 2.23*  --  2.38*    Estimated Creatinine Clearance: 30.7 mL/min (by C-G formula based on SCr of 2.38 mg/dL (H)).   Medical History: Past Medical History:  Diagnosis Date  . CAD (coronary artery disease)    01/15/16 he states he has had a heart attack  . CVA (cerebral infarction)   . DM (diabetes mellitus), type 2 with peripheral vascular complications (HCC)   . History of hepatitis B   . History of hepatitis C   . Peripheral vascular disease (HCC)     Medications:  Scheduled:  . atorvastatin  40 mg Oral Daily  . carvedilol  6.25 mg Oral BID WC  . cefTRIAXone (ROCEPHIN)  IV  1 g Intravenous Q24H  . Chlorhexidine Gluconate Cloth  6 each Topical Q0600  . hydrALAZINE  25 mg Oral TID  . insulin aspart  0-5 Units Subcutaneous QHS  . insulin aspart  0-9 Units Subcutaneous TID WC  . insulin glargine  14 Units Subcutaneous QHS  . levothyroxine  25 mcg Oral QAC breakfast  . mupirocin ointment  1 application Nasal BID  . nicotine  14 mg Transdermal Daily  . Warfarin - Pharmacist Dosing Inpatient   Does not apply q1800   Infusions:   Assessment: 70 yo male with hx of afib will be resumed on coumadin.  INR yesterday was 1.69.  Home coumadin dose was 6 mg po daily but admitting INR was 11.8 requiring reversal with Vitamin K. Warfarin was resumed 12/22.   Today his INR is therapeutic at 2.21 after 3 mg x2 days.  His H/H is low but stable for admission. Platelets are stable. Remains on Rocephin for now. On Carb modified diet with 50% intake.   Goal of Therapy:  INR 2-3 Monitor platelets by anticoagulation protocol: Yes   Plan:  Repeat coumadin 3 mg po x1 again today. Check INR in am  *MD- Please consider stopping Rocephin with only yeast in Urine culture and s/p broad spectrum antibiotics.   Link SnufferJessica Akaisha Truman, PharmD, BCPS Clinical Pharmacist [phone] until 3;30 PM today [phone] after hours 04/06/2016,8:47 AM

## 2016-04-07 DIAGNOSIS — N5089 Other specified disorders of the male genital organs: Secondary | ICD-10-CM

## 2016-04-07 LAB — GLUCOSE, CAPILLARY
GLUCOSE-CAPILLARY: 129 mg/dL — AB (ref 65–99)
Glucose-Capillary: 104 mg/dL — ABNORMAL HIGH (ref 65–99)
Glucose-Capillary: 131 mg/dL — ABNORMAL HIGH (ref 65–99)
Glucose-Capillary: 157 mg/dL — ABNORMAL HIGH (ref 65–99)

## 2016-04-07 LAB — BASIC METABOLIC PANEL
Anion gap: 8 (ref 5–15)
BUN: 38 mg/dL — ABNORMAL HIGH (ref 6–20)
CHLORIDE: 107 mmol/L (ref 101–111)
CO2: 19 mmol/L — AB (ref 22–32)
CREATININE: 2.4 mg/dL — AB (ref 0.61–1.24)
Calcium: 8.3 mg/dL — ABNORMAL LOW (ref 8.9–10.3)
GFR calc non Af Amer: 26 mL/min — ABNORMAL LOW (ref >60)
GFR, EST AFRICAN AMERICAN: 30 mL/min — AB (ref >60)
Glucose, Bld: 119 mg/dL — ABNORMAL HIGH (ref 65–99)
Potassium: 4.6 mmol/L (ref 3.5–5.1)
Sodium: 134 mmol/L — ABNORMAL LOW (ref 135–145)

## 2016-04-07 LAB — PROTIME-INR
INR: 3.5
Prothrombin Time: 36 seconds — ABNORMAL HIGH (ref 11.4–15.2)

## 2016-04-07 LAB — URINE CULTURE: Culture: >=100000 — AB

## 2016-04-07 MED ORDER — FUROSEMIDE 40 MG PO TABS
40.0000 mg | ORAL_TABLET | Freq: Once | ORAL | Status: AC
  Administered 2016-04-07: 40 mg via ORAL
  Filled 2016-04-07: qty 1

## 2016-04-07 MED ORDER — TRAMADOL HCL 50 MG PO TABS
50.0000 mg | ORAL_TABLET | Freq: Three times a day (TID) | ORAL | Status: DC | PRN
  Administered 2016-04-07: 50 mg via ORAL
  Filled 2016-04-07: qty 1

## 2016-04-07 MED ORDER — OXYCODONE HCL 5 MG PO TABS
10.0000 mg | ORAL_TABLET | ORAL | Status: DC | PRN
  Administered 2016-04-07 – 2016-04-08 (×3): 10 mg via ORAL
  Filled 2016-04-07 (×3): qty 2

## 2016-04-07 MED ORDER — OXYCODONE HCL 5 MG PO TABS
5.0000 mg | ORAL_TABLET | ORAL | Status: DC | PRN
  Administered 2016-04-07: 5 mg via ORAL
  Filled 2016-04-07: qty 1

## 2016-04-07 NOTE — Progress Notes (Signed)
Internal Medicine Attending:   I saw and examined the patient. I reviewed the resident's note and I agree with the resident's findings and plan as documented in the resident's note. Scrotal edema possibly mildly worse than yesterday.  Otherwise patient denies complaints.  Urine protein.creatine is elevated and we are obtaining 24 hr collection to quantify.  Can D/C ceftriaxone.  We are awaiting wound vac placement.  He appears to have asymptomatic candiduria, I am not inclined to start treatment at this time. We also adjusted his pain mediation to 5mg  oxycodone for mod pain, 10mg  for severe pain and D/C of dilaudid.

## 2016-04-07 NOTE — Progress Notes (Signed)
   Subjective: [name] was seen and evaluated at bedside. He has no acute complaints. Reports surgical site pain well-controlled. Denied any events overnight  Objective:  Vital signs in last 24 hours: Vitals:   04/06/16 0610 04/06/16 1427 04/06/16 2137 04/07/16 0504  BP: 127/72 136/73 123/86 129/72  Pulse: 86 89 80 76  Resp: 18 18 18 19   Temp: 99.2 F (37.3 C) 98.5 F (36.9 C) 98.6 F (37 C) 98.4 F (36.9 C)  TempSrc: Oral Oral Oral Oral  SpO2: 99% 100% 100% 98%  Weight:   229 lb 0.9 oz (103.9 kg)   Height:       General: Chronically-ill appearing african [place] male.In no acute distress. Resting comfortably in bed. Fatigued HENT: EOMI. No conjunctival injection, icterus or ptosis. Exophthalmos  Cardiovascular: Irregularly irregular. No murmur or rub appreciated. Pulmonary: CTA BL on anterior chest. Unlabored breathing.  Abdomen: Soft, non-tender and non-distended. No guarding or rigidity. +bowel sounds.  Extremities: Right AKA, left foot wrapped in clean, dry dressings.  Skin: Warm, dry. No cyanosis. Psych: Mood normal and affect was mood congruent.  Assessment/Plan:  Active Problems:   Hepatitis C without hepatic coma   Type 2 diabetes mellitus with diabetic peripheral angiopathy without gangrene, without long-term current use of insulin (HCC)   AKI (acute kidney injury) (HCC)   Benign essential HTN   CAD in native artery   Paroxysmal atrial fibrillation (HCC)   PAD (peripheral artery disease) (HCC)   Peripheral neuropathy (HCC)   Gangrene associated with diabetes mellitus (HCC)   Somnolence   Cystitis   Gangrene of toe of left foot (HCC)  Gangrene of toes of left foot 3 days s/p ampuation of 4th and 5th digit. Patient appears to be recovering well from this operation. Site was covered in clean, dry dressings that had been changed earlier this morning. Plan seems to be wound-vac placement to facilitate healing.  -F/u recs from [facility] re: wound-vac  placement  Scrotal Edema Appears slightly worsened from yesterday. Pain appears to be controlled at this time. Urine protein-creatinine ratio elevated and estimated protein loss is about 2.6 g/day. Currently obtaining 24-hr urine protein collection to help quantify and further evaluate cause.  -Continue diuresis  -Pain medications adjusted, IV Dilaudid d/c, tramadol d/c and pt has been ordered either Oxy 5 IR or Oxy 10 ir  AKI Baseline Cr 1.8, 2.4 today.  -monitor renal fxn in setting of diuresis  -D/c IV ceftriaxone based on no bacterial growth x2   Asymptomatic Candiduria  2 urine cultures growing >100,000 units however pt asymptomatic. Not inclined to start treatment with fluconazole at this time.   Supratherapeutic INR INR 3.5 today, continue dosing per pharmacy.  Dispo: Anticipated discharge to SNF pending wound-vac placement.   Ronald Nyce, DO 04/07/2016, 11:46 AM Pager: [phone]

## 2016-04-07 NOTE — Progress Notes (Signed)
ANTICOAGULATION CONSULT NOTE - Follow Up Consult  Pharmacy Consult for Coumadin Indication: atrial fibrillation  No Known Allergies  Patient Measurements: Height: 5\' 10"  (177.8 cm) Weight: 229 lb 0.9 oz (103.9 kg) IBW/kg (Calculated) : 73   Vital Signs: Temp: 98.4 F (36.9 C) (12/25 0504) Temp Source: Oral (12/25 0504) BP: 129/72 (12/25 0504) Pulse Rate: 76 (12/25 0504)  Labs:  Recent Labs  04/05/16 0423 04/06/16 0524 04/06/16 0615 04/07/16 0332  HGB 8.8*  --  8.8*  --   HCT 29.0*  --  28.6*  --   PLT 217  --  234  --   LABPROT 22.8* 24.9*  --  36.0*  INR 1.98 2.21  --  3.50  CREATININE 2.23*  --  2.38* 2.40*    Estimated Creatinine Clearance: 35.6 mL/min (by C-G formula based on SCr of 2.4 mg/dL (H)).   Medications:  Scheduled:  . atorvastatin  40 mg Oral Daily  . carvedilol  6.25 mg Oral BID WC  . cefTRIAXone (ROCEPHIN)  IV  1 g Intravenous Q24H  . Chlorhexidine Gluconate Cloth  6 each Topical Q0600  . furosemide  40 mg Oral Daily  . hydrALAZINE  25 mg Oral TID  . insulin aspart  0-5 Units Subcutaneous QHS  . insulin aspart  0-9 Units Subcutaneous TID WC  . insulin glargine  14 Units Subcutaneous QHS  . levothyroxine  25 mcg Oral QAC breakfast  . mupirocin ointment  1 application Nasal BID  . nicotine  14 mg Transdermal Daily  . Warfarin - Pharmacist Dosing Inpatient   Does not apply q1800    Assessment: 70yo male with AFib, on Coumadin 6mg  daily pta with last dose at that time given on 12/19.  Of note, he had a POC INR 11.8 on 1218, then admitted 11/20 with INR > 10 & bleeding gums.  He received Vit K 10mg  IV on 12/20 and Coumadin was resumed 12/22.  Today, INR is up to 3.5.  Per d/w RN, there has been no bleeding.  Pt is also on Ceftriaxone for UTI.  I spoke with MD today who is awaiting repeat urine cx results prior to stopping Ceftriaxone.  Initial urine culture was (+) > 100K yeast.  Goal of Therapy:  INR 2-3 Monitor platelets by anticoagulation  protocol: Yes   Plan:  No Coumadin today Watch for s/s of bleeding. Daily INR  Consider stopping Ceftriaxone and starting Fluconazole for UTI, if pt was initially symptomatic.   Marisue HumbleKendra Sammye Staff, PharmD Clinical Pharmacist [facility]

## 2016-04-07 NOTE — Progress Notes (Signed)
   VASCULAR SURGERY ASSESSMENT & PLAN:  3 Days Post-Op s/p: Amputation of Left 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s.   Waiting on VAC  SUBJECTIVE: Mental status unchanged.   PHYSICAL EXAM: Vitals:   04/06/16 0610 04/06/16 1427 04/06/16 2137 04/07/16 0504  BP: 127/72 136/73 123/86 129/72  Pulse: 86 89 80 76  Resp: 18 18 18 19   Temp: 99.2 F (37.3 C) 98.5 F (36.9 C) 98.6 F (37 C) 98.4 F (36.9 C)  TempSrc: Oral Oral Oral Oral  SpO2: 99% 100% 100% 98%  Weight:   229 lb 0.9 oz (103.9 kg)   Height:       Dressing dry. No cellulitis   LABS: Lab Results  Component Value Date   WBC 12.2 (H) 04/06/2016   HGB 8.8 (L) 04/06/2016   HCT 28.6 (L) 04/06/2016   MCV 93.2 04/06/2016   PLT 234 04/06/2016   Lab Results  Component Value Date   CREATININE 2.40 (H) 04/07/2016   Lab Results  Component Value Date   INR 3.50 04/07/2016   PROTIME 92.8 (A) 03/31/2016   CBG (last 3)   Recent Labs  04/06/16 1209 04/06/16 1720 04/07/16 0804  GLUCAP 153* 120* 104*    Active Problems:   Hepatitis C without hepatic coma   Type 2 diabetes mellitus with diabetic peripheral angiopathy without gangrene, without long-term current use of insulin (HCC)   AKI (acute kidney injury) (HCC)   Benign essential HTN   CAD in native artery   Paroxysmal atrial fibrillation (HCC)   PAD (peripheral artery disease) (HCC)   Peripheral neuropathy (HCC)   Gangrene associated with diabetes mellitus (HCC)   Somnolence   Cystitis   Gangrene of toe of left foot [facility](HCC)    Ronald CarawayChris Jaaliyah Lucatero Beeper: [phone]: [phone] 04/07/2016

## 2016-04-07 NOTE — Progress Notes (Signed)
Initiated 24-hour urine collection at 0600; collection containers on ice, receiving RN informed.

## 2016-04-07 NOTE — Progress Notes (Signed)
Patient needs encouragement with ambulation and repositioning on bed.  Still waiting for wound vac.

## 2016-04-07 NOTE — Progress Notes (Signed)
Placed patient on CPAP set at 10cm for the night  

## 2016-04-08 ENCOUNTER — Telehealth: Payer: Self-pay | Admitting: Vascular Surgery

## 2016-04-08 LAB — CREATININE, URINE, 24 HOUR
CREATININE, URINE: 110.38 mg/dL
Collection Interval-UCRE24: 24 hours
Creatinine, 24H Ur: 26 mg/d — ABNORMAL LOW (ref 800–2000)
Urine Total Volume-UCRE24: 24 mL

## 2016-04-08 LAB — GLUCOSE, CAPILLARY
GLUCOSE-CAPILLARY: 104 mg/dL — AB (ref 65–99)
Glucose-Capillary: 120 mg/dL — ABNORMAL HIGH (ref 65–99)

## 2016-04-08 LAB — CULTURE, BLOOD (ROUTINE X 2)
CULTURE: NO GROWTH
CULTURE: NO GROWTH

## 2016-04-08 LAB — BASIC METABOLIC PANEL
Anion gap: 5 (ref 5–15)
BUN: 37 mg/dL — ABNORMAL HIGH (ref 6–20)
CO2: 21 mmol/L — AB (ref 22–32)
CREATININE: 1.9 mg/dL — AB (ref 0.61–1.24)
Calcium: 8.3 mg/dL — ABNORMAL LOW (ref 8.9–10.3)
Chloride: 108 mmol/L (ref 101–111)
GFR, EST AFRICAN AMERICAN: 40 mL/min — AB (ref >60)
GFR, EST NON AFRICAN AMERICAN: 35 mL/min — AB (ref >60)
Glucose, Bld: 126 mg/dL — ABNORMAL HIGH (ref 65–99)
Potassium: 4.7 mmol/L (ref 3.5–5.1)
SODIUM: 134 mmol/L — AB (ref 135–145)

## 2016-04-08 LAB — PROTIME-INR
INR: 3.25
Prothrombin Time: 33.9 seconds — ABNORMAL HIGH (ref 11.4–15.2)

## 2016-04-08 MED ORDER — WARFARIN SODIUM 1 MG PO TABS
1.0000 mg | ORAL_TABLET | Freq: Once | ORAL | Status: AC
  Administered 2016-04-08: 1 mg via ORAL
  Filled 2016-04-08: qty 1

## 2016-04-08 NOTE — Progress Notes (Signed)
ANTICOAGULATION CONSULT NOTE - Follow Up Consult  Pharmacy Consult for Coumadin Indication: atrial fibrillation  No Known Allergies  Patient Measurements: Height: 5\' 10"  (177.8 cm) Weight: 229 lb 0.9 oz (103.9 kg) IBW/kg (Calculated) : 73  Vital Signs: Temp: 98 F (36.7 C) (12/26 0541) Temp Source: Oral (12/26 0541) BP: 137/62 (12/26 0541) Pulse Rate: 78 (12/26 0541)  Labs:  Recent Labs  04/06/16 0524 04/06/16 0615 04/07/16 0332 04/08/16 0557 04/08/16 0932  HGB  --  8.8*  --   --   --   HCT  --  28.6*  --   --   --   PLT  --  234  --   --   --   LABPROT 24.9*  --  36.0* 33.9*  --   INR 2.21  --  3.50 3.25  --   CREATININE  --  2.38* 2.40*  --  1.90*    Estimated Creatinine Clearance: 44.9 mL/min (by C-G formula based on SCr of 1.9 mg/dL (H)).   Medications:  Scheduled:  . atorvastatin  40 mg Oral Daily  . carvedilol  6.25 mg Oral BID WC  . furosemide  40 mg Oral Daily  . hydrALAZINE  25 mg Oral TID  . insulin aspart  0-5 Units Subcutaneous QHS  . insulin aspart  0-9 Units Subcutaneous TID WC  . insulin glargine  14 Units Subcutaneous QHS  . levothyroxine  25 mcg Oral QAC breakfast  . nicotine  14 mg Transdermal Daily  . Warfarin - Pharmacist Dosing Inpatient   Does not apply q1800    Assessment: 70yo male with AFib, on Coumadin 6mg  daily pta with last dose at that time given on 12/19.  Of note, he had a POC INR 11.8 on 1218, then admitted 11/20 with INR > 10 & bleeding gums.  He received Vit K 10mg  IV on 12/20, a dose of Septra DS on 12/21 and Coumadin was resumed 12/22.  Today, INR decreased after held yesterday.  No bleeding.  Goal of Therapy:  INR 2-3 Monitor platelets by anticoagulation protocol: Yes   Plan:  Coumadin 1mg  today, a small dose as INR dropped this AM Daily INR Watch for s/s of bleeding  Marisue HumbleKendra Neomia Herbel, PharmD Clinical Pharmacist [facility]

## 2016-04-08 NOTE — Progress Notes (Signed)
Internal Medicine Attending:   I saw and examined the patient. I reviewed the resident's note and I agree with the resident's findings and plan as documented in the resident's note. Mr Ronald Simpson is feeling a little better today, he feels his scrotal swelling has decreased some.  His pain is well controlled and otherwise he is feeling well. On my exam his foley catheter remains in placed with light yellow urine draining, his scrotal swelling is grossly unchanged as far as I can tell.  His bandage of his left LE is c/d/i.  His lungs are clear. Apparently his wound vac may be placed at SNF and therefore we will arrange for discharge today,  We will need to follow up his 24 hour urine collection, if he does have nephrotic range proteinuria then he would need referral to Nephrology, start ACEi, and consideration of a high protein supplement.  His foley will need to stay in place until his edema has resolved.

## 2016-04-08 NOTE — Telephone Encounter (Signed)
-----   Message from Sharee PimpleMarilyn K McChesney, RN sent at 04/08/2016  9:25 AM EST ----- Regarding: 2-3 weeks for toe amp   ----- Message ----- From: Raymond GurneyKimberly A Trinh, PA-C Sent: 04/08/2016   7:51 AM To: [facility] Charge Pool  S/p Open amputation of left fourth and fifth toe including metatarsal head 04/04/16  F/u with Dr. Arbie CookeyEarly in next 2-3 weeks  Thanks Selena BattenKim

## 2016-04-08 NOTE — Progress Notes (Addendum)
[facility] Progress Note  Subjective  - POD #4  No complaints.   Objective Vitals:   04/07/16 1924 04/08/16 0541  BP: 135/74 137/62  Pulse: 80 78  Resp: 19 18  Temp: 98.7 F (37.1 C) 98 F (36.7 C)    Intake/Output Summary (Last 24 hours) at 04/08/16 0749 Last data filed at 04/08/16 0543  Gross per 24 hour  Intake              220 ml  Output             2300 ml  Net            -2080 ml   Left 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 amputation site clean with good granulation tissue. No purulence.   Assessment/Planning: 70 y.o. male is s/p: Open amputation of left fourth and fifth toe including metatarsal head 4 Days Post-Op   Amputation site healing.  Awaiting placement of VAC. CSW to arrange.  [facility]   Raymond GurneyKimberly A Trinh 04/08/2016 7:49 AM --  Laboratory CBC    Component Value Date/Time   WBC 12.2 (H) 04/06/2016 0615   HGB 8.8 (L) 04/06/2016 0615   HCT 28.6 (L) 04/06/2016 0615   PLT 234 04/06/2016 0615    BMET    Component Value Date/Time   NA 134 (L) 04/07/2016 0332   NA 139 03/03/2016   K 4.6 04/07/2016 0332   CL 107 04/07/2016 0332   CO2 19 (L) 04/07/2016 0332   GLUCOSE 119 (H) 04/07/2016 0332   BUN 38 (H) 04/07/2016 0332   BUN 37 (A) 03/03/2016   CREATININE 2.40 (H) 04/07/2016 0332   CALCIUM 8.3 (L) 04/07/2016 0332   GFRNONAA 26 (L) 04/07/2016 0332   GFRAA 30 (L) 04/07/2016 0332    COAG Lab Results  Component Value Date   INR 3.25 04/08/2016   INR 3.50 04/07/2016   INR 2.21 04/06/2016   PROTIME 92.8 (A) 03/31/2016   PROTIME 29.1 (A) 03/25/2016   PROTIME 34.0 (A) 03/19/2016   No results found for: PTT  Antibiotics Anti-infectives    Start     Dose/Rate Route Frequency Ordered Stop   04/05/16 1000  cefTRIAXone (ROCEPHIN) 1 g in dextrose 5 % 50 mL IVPB  Status:  Discontinued     1 g 100 mL/hr over 30 Minutes Intravenous Every 24 hours 04/05/16 0818 04/07/16 1026   04/04/16 0600  vancomycin (VANCOCIN)  IVPB 750 mg/150 ml premix  Status:  Discontinued     750 mg 150 mL/hr over 60 Minutes Intravenous Every 12 hours 04/03/16 1703 04/05/16 0708   04/03/16 1800  vancomycin (VANCOCIN) 1,250 mg in sodium chloride 0.9 % 250 mL IVPB     1,250 mg 166.7 mL/hr over 90 Minutes Intravenous  Once 04/03/16 1703 04/03/16 1926   04/03/16 1715  piperacillin-tazobactam (ZOSYN) IVPB 3.375 g  Status:  Discontinued     3.375 g 12.5 mL/hr over 240 Minutes Intravenous Every 8 hours 04/03/16 1703 04/05/16 0708   04/03/16 1215  sulfamethoxazole-trimethoprim (BACTRIM DS,SEPTRA DS) 800-160 MG per tablet 1 tablet  Status:  Discontinued     1 tablet Oral Every 12 hours 04/03/16 1207 04/03/16 1418       Maris BergerKimberly Trinh, PA-C [facility] Office: [phone] Pager: [phone] 04/08/2016 7:49 AM  I have interviewed the patient and examined the patient. I agree with the findings by the PA. Awaiting VAC.   Cari Carawayhris Gamaliel Charney, MD  [phone]    

## 2016-04-08 NOTE — Clinical Social Work Note (Addendum)
[facility] once [facility]ound Vac available @ [facility]F. [facility], spoke to PecosRhonda, wound vac will be ordered ok to send pt this afternoon. CSW will inform MD for DC summary. Pt has no other needs at this time. CSW will remain available for needs/support as they are identified.  CSW spoke to [place] after MD questioned if SNF nurse could place wound vac. MD informed CSW pt would have to stay a few more days to get wound vac placed here. CSW contacted SNF, Per Bjorn Loserhonda nurse at SNF can place vac and vac has been ordered. Ok for pt to come today.  CSW set up transport with [facility]. Notified Nurse and family (message). DC Summary/Transfr Rpt faxed to facility. Transport forms given to receptionist on 6N. Pt has no other needs at this time.   CSW is signing off.   Ronald Simpson,MSW, LCSWA Clinical Social Work Dept Weekend Social Worker [phone] 12:13 PM

## 2016-04-08 NOTE — Discharge Instructions (Signed)
PATIENT REQUIRES WOUND-VAC PLACEMENT     Information on my medicine - Coumadin   (Warfarin)  This medication education was reviewed with me or my healthcare representative as part of my discharge preparation.  The pharmacist that spoke with me during my hospital stay was:  Chinita GreenlandJohnston, Nita Faye, [facility] CountyRPH  Why was Coumadin prescribed for you? Coumadin was prescribed for you because you have a blood clot or a medical condition that can cause an increased risk of forming blood clots. Blood clots can cause serious health problems by blocking the flow of blood to the heart, lung, or brain. Coumadin can prevent harmful blood clots from forming. As a reminder your indication for Coumadin is:   Select from menu  What test will check on my response to Coumadin? While on Coumadin (warfarin) you will need to have an INR test regularly to ensure that your dose is keeping you in the desired range. The INR (international normalized ratio) number is calculated from the result of the laboratory test called prothrombin time (PT).  If an INR APPOINTMENT HAS NOT ALREADY BEEN MADE FOR YOU please schedule an appointment to have this lab work done by your health care provider within 7 days. Your INR goal is usually a number between:  2 to 3 or your provider may give you a more narrow range like 2-2.5.  Ask your health care provider during an office visit what your goal INR is.  What  do you need to  know  About  COUMADIN? Take Coumadin (warfarin) exactly as prescribed by your healthcare provider about the same time each day.  DO NOT stop taking without talking to the doctor who prescribed the medication.  Stopping without other blood clot prevention medication to take the place of Coumadin may increase your risk of developing a new clot or stroke.  Get refills before you run out.  What do you do if you miss a dose? If you miss a dose, take it as soon as you remember on the same day then continue your regularly scheduled  regimen the next day.  Do not take two doses of Coumadin at the same time.  Important Safety Information A possible side effect of Coumadin (Warfarin) is an increased risk of bleeding. You should call your healthcare provider right away if you experience any of the following: ? Bleeding from an injury or your nose that does not stop. ? Unusual colored urine (red or dark brown) or unusual colored stools (red or black). ? Unusual bruising for unknown reasons. ? A serious fall or if you hit your head (even if there is no bleeding).  Some foods or medicines interact with Coumadin (warfarin) and might alter your response to warfarin. To help avoid this: ? Eat a balanced diet, maintaining a consistent amount of Vitamin K. ? Notify your provider about major diet changes you plan to make. ? Avoid alcohol or limit your intake to 1 drink for women and 2 drinks for men per day. (1 drink is 5 oz. wine, 12 oz. beer, or 1.5 oz. liquor.)  Make sure that ANY health care provider who prescribes medication for you knows that you are taking Coumadin (warfarin).  Also make sure the healthcare provider who is monitoring your Coumadin knows when you have started a new medication including herbals and non-prescription products.  Coumadin (Warfarin)  Major Drug Interactions  Increased Warfarin Effect Decreased Warfarin Effect  Alcohol (large quantities) Antibiotics (esp. Septra/Bactrim, Flagyl, Cipro) Amiodarone (Cordarone) Aspirin (ASA) Cimetidine (  Tagamet) Megestrol (Megace) NSAIDs (ibuprofen, naproxen, etc.) Piroxicam (Feldene) Propafenone (Rythmol SR) Propranolol (Inderal) Isoniazid (INH) Posaconazole (Noxafil) Barbiturates (Phenobarbital) Carbamazepine (Tegretol) Chlordiazepoxide (Librium) Cholestyramine (Questran) Griseofulvin Oral Contraceptives Rifampin Sucralfate (Carafate) Vitamin K   Coumadin (Warfarin) Major Herbal Interactions  Increased Warfarin Effect Decreased Warfarin Effect    Garlic Ginseng Ginkgo biloba Coenzyme Q10 Green tea St. Johns wort    Coumadin (Warfarin) FOOD Interactions  Eat a consistent number of servings per week of foods HIGH in Vitamin K (1 serving =  cup)  Collards (cooked, or boiled & drained) Kale (cooked, or boiled & drained) Mustard greens (cooked, or boiled & drained) Parsley *serving size only =  cup Spinach (cooked, or boiled & drained) Swiss chard (cooked, or boiled & drained) Turnip greens (cooked, or boiled & drained)  Eat a consistent number of servings per week of foods MEDIUM-HIGH in Vitamin K (1 serving = 1 cup)  Asparagus (cooked, or boiled & drained) Broccoli (cooked, boiled & drained, or raw & chopped) Brussel sprouts (cooked, or boiled & drained) *serving size only =  cup Lettuce, raw (green leaf, endive, romaine) Spinach, raw Turnip greens, raw & chopped   These websites have more information on Coumadin (warfarin):  http://www.king-russell.com/www.coumadin.com; https://www.hines.net/www.ahrq.gov/consumer/coumadin.htm;

## 2016-04-08 NOTE — Clinical Social Work Placement (Signed)
   CLINICAL SOCIAL WORK PLACEMENT  NOTE  Date:  04/08/2016  Patient Details  Name: Ronald Simpson MRN: [id number] Date of Birth: [birth year]  Clinical Social Work is seeking post-discharge placement for this patient at the Skilled  Nursing Facility level of care (*CSW will initial, date and re-position this form in  chart as items are completed):  Yes   Patient/family provided with [facility] Clinical Social Work Department's list of facilities offering this level of care within the geographic area requested by the patient (or if unable, by the patient's family).  Yes   Patient/family informed of their freedom to choose among providers that offer the needed level of care, that participate in Medicare, Medicaid or managed care program needed by the patient, have an available bed and are willing to accept the patient.  Yes   Patient/family informed of [facility]'s ownership interest in [facility] and [facility], as well as of the fact that they are under no obligation to receive care at these facilities.  PASRR submitted to EDS on       PASRR number received on       Existing PASRR number confirmed on 04/08/16     FL2 transmitted to all facilities in geographic area requested by pt/family on       FL2 transmitted to all facilities within larger geographic area on       Patient informed that his/her managed care company has contracts with or will negotiate with certain facilities, including the following:        Yes   Patient/family informed of bed offers received.  Patient chooses bed at       Physician recommends and patient chooses bed at      Patient to be [facility] to  [facility] on 04[facility]08/16.  Patient to be transferred to facility by       Patient family notified on 04/08/16 of transfer.  Name of family member notified:  Message left with Dtr [name] to contact CSW about DC planning.     PHYSICIAN       Additional Comment:     _______________________________________________ Norlene DuelBROWN, Levone Otten B, LCSWA 04/08/2016, 5:00 PM

## 2016-04-08 NOTE — Telephone Encounter (Signed)
sched appt 04/23/16 at 9:30. Spoke to pt to inform them of appt.

## 2016-04-08 NOTE — Progress Notes (Signed)
[facility] with [facility] Report given to ScotiaJoan. Made aware of scrotal and penile edema. A small skin tear at the base of the scrotum, cleansed with soap and water. Foley cath care done. Left foot dressing dry and intact. Discharged pt via [facility].

## 2016-04-08 NOTE — Care Management Note (Signed)
Case Management Note  Patient Details  Name: Ronald Simpson MRN: [id number] Date of Birth: 1945-12-01  Subjective/Objective:                    Action/Plan:  [facility] SW arranges same. Consulted SW. Expected Discharge Date:                  Expected Discharge Plan:  Skilled Nursing Facility  In-House Referral:  Clinical Social Work  Discharge planning Services     Post Acute Care Choice:    Choice offered to:     DME Arranged:    DME Agency:     HH Arranged:    HH Agency:     Status of Service:  Completed, signed off  If discussed at [employer] Meetings, dates discussed:    Additional Comments:  Ronald Simpson, Ronald Frisbee Marie, RN 04/08/2016, 7:44 AM

## 2016-04-08 NOTE — Progress Notes (Signed)
   Subjective: The patient was seen and evaluated today at bedside. Patient was in lighter spirits than usual and reports that his foot pain and scrotal pain is controlled on current regimen. Reports that he feels his scrotal edema has improved overnight. Denies any current complaints.  Objective:  Vital signs in last 24 hours: Vitals:   04/06/16 2137 04/07/16 0504 04/07/16 1924 04/08/16 0541  BP: 123/86 129/72 135/74 137/62  Pulse: 80 76 80 78  Resp: '18 19 19 18  '$ Temp: 98.6 F (37 C) 98.4 F (36.9 C) 98.7 F (37.1 C) 98 F (36.7 C)  TempSrc: Oral Oral Oral Oral  SpO2: 100% 98% 98% 99%  Weight: 229 lb 0.9 oz (103.9 kg)     Height:       General: In no acute distress. Resting comfortably in bed. More alert than yesterday's examination HENT: EOMI. No conjunctival injection, icterus or ptosis. Exophthalmos  Cardiovascular: Regular rate and rhythm. No murmur or rub appreciated. Pulmonary: CTA BL on anterior chest. Abdomen: Soft, non-tender and non-distended. No guarding or rigidity. +bowel sounds.  Genitourinary: Scrotal edema present however improved from yesterday's examination. Penile swelling present however may appear slightly worse than yesterday's examination. Foley catheter in place Extremities: Right AKA. Left foot was wrapped in clean dry and intact dressings. Skin: Warm, dry. No cyanosis Psych: Mood normal and affect was mood congruent. Responds to questions appropriately.   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 Amputation site was clean with good granulation tissue per vascular surgery. Presently awaiting placement of VAC and plan is to discharge back to skilled nursing facility once this is placed. -Continue to follow-up recommendations from vascular surgery  Scrotal edema Scrotal edema itself appears improved since yesterday however penile swelling appears to have worsened. Patient reports his pain is controlled on current medication regimen. Yesterday's urine protein creatinine ratio was elevated with an estimated protein loss of about 2.6 g per day. 24 hour urine protein collection was obtained and am currently awaiting results of this. Continue diuresis with current regimen -Continue pain medications with current regimen  Acute kidney injury Baseline 1.8 was 2.4 yesterday. We'll follow-up E met tomorrow morning  Subtherapeutic INR INR 3.25 today was 3.5 yesterday. Continue warfarin dosing per pharmacy  Dispo: Anticipated discharge to skilled nursing facility once wound VAC is placed.  Kate Sweetman, DO 04/08/2016, 9:22 AM Pager: [phone]

## 2016-04-10 ENCOUNTER — Encounter: Payer: Self-pay | Admitting: Vascular Surgery

## 2016-04-10 ENCOUNTER — Encounter: Payer: Self-pay | Admitting: Internal Medicine

## 2016-04-10 ENCOUNTER — Non-Acute Institutional Stay (SKILLED_NURSING_FACILITY): Payer: Medicare Other | Admitting: Internal Medicine

## 2016-04-10 DIAGNOSIS — E1151 Type 2 diabetes mellitus with diabetic peripheral angiopathy without gangrene: Secondary | ICD-10-CM

## 2016-04-10 DIAGNOSIS — R4182 Altered mental status, unspecified: Secondary | ICD-10-CM | POA: Insufficient documentation

## 2016-04-10 DIAGNOSIS — Z9189 Other specified personal risk factors, not elsewhere classified: Secondary | ICD-10-CM | POA: Diagnosis not present

## 2016-04-10 DIAGNOSIS — N5089 Other specified disorders of the male genital organs: Secondary | ICD-10-CM

## 2016-04-10 DIAGNOSIS — I96 Gangrene, not elsewhere classified: Secondary | ICD-10-CM

## 2016-04-10 NOTE — Assessment & Plan Note (Signed)
This is high risk , controlled substance as per Dr Monia SabalBeer' s list & history as recorded. Orders were written that no opiods will be called in by on call providers. If somnolence occurs, Narcan will be administered and urine drug screen will be performed. Attempts will be made to wean the opioid further.

## 2016-04-10 NOTE — Patient Instructions (Signed)
See Current Assessment & Plan in Problem List under specific Diagnosis 

## 2016-04-10 NOTE — Progress Notes (Signed)
[facility] Room [facility]A  PCP: Tilden DomeSCHNEIDER,RICHARD, MD No address on file    This is a comprehensive readmission note to [facility] performed on this date less than 30 days from date of hospital admission. Included are preadmission medical/surgical history;reconciled medication list; family history; social history and comprehensive review of systems.  Corrections and additions to the records were documented . Comprehensive physical exam was also performed. Additionally a clinical summary was entered for each active diagnosis pertinent to this admission in the Problem List to enhance continuity of care.  HPI: The patient was hospitalized 12/20-12/26/17, he was admitted with altered mental status manifested as progressive lethargy over several days with acute worsening the day of admission. 2 doses of Narcan resulted in immediate improvement in responsiveness.. After the Narcan the patient reported that the pain medicine was making him "hallucinate" and "feel strange". Oxycodone doses were weaned. Significantly he has a history of cocaine abuse He received approximately 1.5 L of IV fluids and developed scrotal swelling. Evaluation suggested underlying renal dysfunction/nephrotic disease. He was found to have a supratherapeutic INH with a PT/INR of 11.7. He received vitamin K in the emergency room.At discharge INR was 3.25. He was found to have gangrene of his left fourth toe, CT of the left foot showed osteomyelitis of the fourth phalanx. The left fourth and fifth toes were amputated 04/04/16 ;he had  had an AKA right 01/07/16. Nursing states that prior to his admission his intake of opioids had been minimal .They report that yesterday he was very garrulous & asking staff to massage his testicles. When they declined , he called them a "bitch".  Past medical and surgical history: He has history of hepatitis B and C,cerebral infarction, &  diabetes. He has had coronary artery bypass  grafting in 2005  Social history: Reviewed , presently on nicotine patch.  Family history: Reviewed  Review of systems:Could not be completed due to profound lethargy and somnolence.  Physical exam:  Pertinent or positive findings: He was soundly sleeping when I entered room and slept throughout the exam. His breakfast tray was uneaten and the TV was blaring. He did not even wake when I opened his eyes. Opening  his eyes revealed unsustained horizontal nystagmus. Pattern alopecia is present. Proptosis suggested. Digital hygiene is extremely poor. Oropharynx cannot be visualized as the tongue limited visualization. Tongue is dry. Cardiac rhythm clinically is regular. Breath sounds are decreased with minor rales. He exhibits intermittent low-grade snoring and apnea. Bowel sounds are decreased. Scrotum and penis are massively edematous. Foley catheter is in place. AKA is present on the right. The left foot is dressed. The fourth and fifth toes on the left are amputated. Toenails are thickened. No pulses are not palpable on the left   he has one half-three fourths pitting up to the knee on the left. He has scattered irregular hyperpigmentation over the left shin. Even after the exam was completed he did not a wake up.   General appearance:   Lymphatic: No lymphadenopathy about the head, neck, axilla . Eyes: No conjunctival inflammation or lid edema is present. There is no scleral icterus. Ears:  External ear exam shows no significant lesions or deformities.   Nose:  External nasal examination shows no deformity or inflammation. Nasal mucosa are pink and moist without lesions ,exudates Oral exam: lips and gums are healthy appearing. Neck:  No thyromegaly, masses, tenderness noted.    Heart:  No gallop, murmur, click, rub .  Abdomen:Bowel sounds are normal.  Abdomen is soft and nontender with no organomegaly, hernias,masses. Extremities:  No cyanosis  Neurologic exam : Strength  in upper & lower  extremities,balance,Rhomberg,finger to nose testing could not be completed due to clinical state Skin: Warm & dry w/o tenting. No significant rash.  See clinical summary under each active problem in the Problem List with associated updated therapeutic plan

## 2016-04-10 NOTE — Assessment & Plan Note (Signed)
Diabetes is presently controlled, monitor  for hypoglycemia

## 2016-04-10 NOTE — Assessment & Plan Note (Signed)
Wound Care Nurse  monitor at SNF. 

## 2016-04-10 NOTE — Assessment & Plan Note (Addendum)
Patient is at high risk for adverse reaction to opioids, he has a history of a cocaine abuse. See 04/10/16 exam suggest drug adverse reaction Urine for UDS Narcan 1 amp Opioids and sedatives will be discontinued

## 2016-04-10 NOTE — Assessment & Plan Note (Signed)
Correct hypoalbuminemia & diurese as needed

## 2016-04-13 LAB — BASIC METABOLIC PANEL
BUN: 20 mg/dL (ref 4–21)
CREATININE: 1.1 mg/dL (ref 0.6–1.3)
Glucose: 129 mg/dL
POTASSIUM: 4.5 mmol/L (ref 3.4–5.3)
SODIUM: 137 mmol/L (ref 137–147)

## 2016-04-13 LAB — POCT INR: INR: 3.9 — AB (ref 0.9–1.1)

## 2016-04-13 LAB — PROTIME-INR: Protime: 37.2 seconds — AB (ref 10.0–13.8)

## 2016-04-14 LAB — PROTIME-INR: PROTIME: 37.3 s — AB (ref 10.0–13.8)

## 2016-04-14 LAB — POCT INR: INR: 3.8 — AB (ref 0.9–1.1)

## 2016-04-16 LAB — POCT INR: INR: 2.4 — AB (ref 0.9–1.1)

## 2016-04-16 LAB — PROTIME-INR: Protime: 26 seconds — AB (ref 10.0–13.8)

## 2016-04-17 ENCOUNTER — Telehealth: Payer: Self-pay

## 2016-04-17 NOTE — Telephone Encounter (Signed)
rec'd phone call from [facility] at [facility]  Requested clarification on weight bearing restriction of left LE.  Discussed with Dr. Arbie CookeyEarly; advised to place pt. in an off-loading shoe (L) foot, for ambulation.  Notified Stacy, PT, of Dr. Bosie HelperEarly's recommendations.  Verb. Understanding.

## 2016-04-18 LAB — BASIC METABOLIC PANEL
BUN: 21 mg/dL (ref 4–21)
CREATININE: 1.1 mg/dL (ref 0.6–1.3)
GLUCOSE: 150 mg/dL
Potassium: 4.8 mmol/L (ref 3.4–5.3)
Sodium: 141 mmol/L (ref 137–147)

## 2016-04-18 LAB — PROTIME-INR: Protime: 25.4 seconds — AB (ref 10.0–13.8)

## 2016-04-18 LAB — POCT INR: INR: 2.3 — AB (ref 0.9–1.1)

## 2016-04-23 ENCOUNTER — Ambulatory Visit (INDEPENDENT_AMBULATORY_CARE_PROVIDER_SITE_OTHER): Payer: Medicare Other | Admitting: Vascular Surgery

## 2016-04-23 ENCOUNTER — Encounter: Payer: Self-pay | Admitting: Vascular Surgery

## 2016-04-23 VITALS — BP 130/81 | HR 92 | Temp 99.0°F | Resp 18 | Ht 70.0 in | Wt 229.0 lb

## 2016-04-23 DIAGNOSIS — I739 Peripheral vascular disease, unspecified: Secondary | ICD-10-CM

## 2016-04-23 LAB — POCT INR: INR: 2.6 — AB (ref 0.9–1.1)

## 2016-04-23 LAB — PROTIME-INR: Protime: 27.8 s — AB (ref 10.0–13.8)

## 2016-04-23 NOTE — Progress Notes (Signed)
Patient name: Ronald Simpson MRN: [id number] DOB: Jan 06, 1946 Sex: male  REASON FOR VISIT: Left fourth and fifth toe amputation from 04/04/2016  HPI: Ronald Simpson is a 71 y.o. male here for follow-up. He is in a nursing facility. Has a well-healed right above-knee amputation. He reports difficulty with pain control and his amputation site  Current Outpatient Prescriptions  Medication Sig Dispense Refill  . acetaminophen (TYLENOL) 325 MG tablet Take 650 mg by mouth every 6 (six) hours as needed for mild pain, moderate pain or fever.    Marland Kitchen amitriptyline (ELAVIL) 25 MG tablet Take 25 mg by mouth at bedtime.    Marland Kitchen atorvastatin (LIPITOR) 40 MG tablet Take 40 mg by mouth daily.    . carvedilol (COREG) 6.25 MG tablet Take 6.25 mg by mouth 2 (two) times daily with a meal.    . docusate sodium (COLACE) 100 MG capsule Take 1 capsule (100 mg total) by mouth 2 (two) times daily. (Patient taking differently: Take 100 mg by mouth daily. ) 10 capsule 0  . ferrous sulfate 325 (65 FE) MG tablet Take 325 mg by mouth 2 (two) times daily with a meal.    . furosemide (LASIX) 40 MG tablet Take 40 mg by mouth daily.     . hydrALAZINE (APRESOLINE) 25 MG tablet Take 25 mg by mouth 3 (three) times daily.    . insulin glargine (LANTUS) 100 unit/mL SOPN Inject 17 Units into the skin at bedtime.    Marland Kitchen ipratropium-albuterol (DUONEB) 0.5-2.5 (3) MG/3ML SOLN Take 3 mLs by nebulization every 6 (six) hours as needed.    . isosorbide mononitrate (IMDUR) 30 MG 24 hr tablet Take 1 tablet (30 mg total) by mouth daily. 30 tablet 1  . levothyroxine (SYNTHROID, LEVOTHROID) 25 MCG tablet Take 25 mcg by mouth daily before breakfast.    . nicotine (NICODERM CQ - DOSED IN MG/24 HOURS) 14 mg/24hr patch Place 14 mg onto the skin daily.    . nitroGLYCERIN (NITROSTAT) 0.4 MG SL tablet Place 0.4 mg under the tongue every 5 (five) minutes as needed for chest pain.    . OXYGEN Inhale into the lungs. 2lpm via  [place] prn for shortness of breath or O2 sat <90%    . pregabalin (LYRICA) 100 MG capsule Take 100 mg by mouth every 8 (eight) hours.    . traMADol (ULTRAM) 50 MG tablet Take 50 mg by mouth every 8 (eight) hours as needed. For breakthrough pain    . warfarin (COUMADIN) 6 MG tablet Take 6 mg by mouth daily at 6 PM.      No current facility-administered medications for this visit.      PHYSICAL EXAM: Vitals:   04/23/16 0919  BP: 130/81  Pulse: 92  Resp: 18  Temp: 99 F (37.2 C)  TempSrc: Oral  SpO2: 96%  Weight: 229 lb (103.9 kg)  Height: 5\' 10"  (1.778 m)    GENERAL: The patient is a well-nourished male, in no acute distress. The vital signs are documented above. He does have excellent Angelynn Lemus granulating base to his open toe amputations on the fourth and fifth toe. There is some necrosis at the third metatarsal head.  MEDICAL ISSUES: Stable overall. Apparently was unable to tolerateVAC. Feel he would be best served with local wound care anyway. Difficult to maintain a VAC over this area. Recommend normal saline wet-to-dry dressings twice a day. Also recommended Percocet 08/15/2023 one to 2 every 4-6 hours for pain. We'll see him in one month for  continued follow-up   Larina Earthlyodd F. Kellis Mcadam, MD [facility] Office Tel [phone] Pager [phone]

## 2016-04-28 ENCOUNTER — Other Ambulatory Visit: Payer: Self-pay | Admitting: *Deleted

## 2016-04-28 MED ORDER — PREGABALIN 100 MG PO CAPS: ORAL_CAPSULE | ORAL | 0 refills | Status: DC

## 2016-04-28 NOTE — Telephone Encounter (Signed)
[facility] [phone] Fax: [phone]  

## 2016-04-30 LAB — PROTIME-INR: Protime: 26.1 seconds — AB (ref 10.0–13.8)

## 2016-04-30 LAB — POCT INR: INR: 2.4 — AB (ref 0.9–1.1)

## 2016-05-07 ENCOUNTER — Encounter: Payer: Self-pay | Admitting: Nurse Practitioner

## 2016-05-07 ENCOUNTER — Non-Acute Institutional Stay (SKILLED_NURSING_FACILITY): Payer: Medicare Other | Admitting: Nurse Practitioner

## 2016-05-07 DIAGNOSIS — F329 Major depressive disorder, single episode, unspecified: Secondary | ICD-10-CM

## 2016-05-07 DIAGNOSIS — I739 Peripheral vascular disease, unspecified: Secondary | ICD-10-CM

## 2016-05-07 DIAGNOSIS — G63 Polyneuropathy in diseases classified elsewhere: Secondary | ICD-10-CM

## 2016-05-07 DIAGNOSIS — E1151 Type 2 diabetes mellitus with diabetic peripheral angiopathy without gangrene: Secondary | ICD-10-CM

## 2016-05-07 DIAGNOSIS — G894 Chronic pain syndrome: Secondary | ICD-10-CM

## 2016-05-07 DIAGNOSIS — I480 Paroxysmal atrial fibrillation: Secondary | ICD-10-CM | POA: Diagnosis not present

## 2016-05-07 NOTE — Progress Notes (Signed)
Nursing Home Location:  [facility]  Place of Service: SNF (31)  PCP: Tilden DomeSCHNEIDER,RICHARD, MD  No Known Allergies  Chief Complaint  Patient presents with  . Medical Management of Chronic Issues    Routine Visit    HPI:  Patient is a 71 y.o. male [facility] routine follow up on chronic conditions. . Pt with hx of CVA, hep b and C, PVD, AKA, CAD, DM. Pt was hospitalized in December and had amputation of 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. Treatment nurse and wound NP following wound which has been stable. Pt following with Dr Arbie CookeyEarly.  Pt remains in bed most of the day. Staff reports he gets very agitated when he has to sit up for any extended period of time.  Pt has been given narcan twice at facility due to oversedation from pain medication. Percocet has been stopped and will be added to intolerances.  Pt reports he does have some depression due to being dependant on staff. Weight loss noted. No N/V, pt denies constipation or diarrhea.  Occasional shortness of breath. Takes O2 off a lot.   Review of Systems:  Review of Systems  Constitutional: Positive for fatigue. Negative for chills and fever.  HENT: Positive for congestion. Negative for nosebleeds, sore throat and tinnitus.   Respiratory: Negative for cough and shortness of breath.   Cardiovascular: Negative for chest pain, palpitations and leg swelling.  Gastrointestinal: Negative for abdominal pain, constipation and diarrhea.  Genitourinary: Negative for dysuria, frequency and urgency.  Musculoskeletal: Positive for arthralgias. Negative for back pain and myalgias.  Skin: Positive for wound.       Amputated 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, wound covered and dressing CDI  Neurological: Negative for dizziness and headaches.       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 CAD (coronary artery disease)    01/15/16 he states he has had a heart attack  . CVA (cerebral infarction)   . DM (diabetes  mellitus), type 2 with peripheral vascular complications (HCC)   . History of hepatitis B   . History of hepatitis C   . Peripheral vascular disease [facility](HCC)    Past Surgical History:  Procedure Laterality Date  . AMPUTATION Right 01/07/2016   Procedure: AMPUTATION ABOVE KNEE;  Surgeon: Larina Earthlyodd F Early, MD;  [facility];  Service: Vascular;  Laterality: Right;  . AMPUTATION Left 04/04/2016   Procedure: LEFT FOURTH AND FIFTH  TOE AMPUTATION;  Surgeon: Larina Earthlyodd F Early, MD;  [facility];  Service: Vascular;  Laterality: Left;  . BELOW KNEE LEG AMPUTATION Right   . CORONARY ARTERY BYPASS GRAFT  2005  . PERIPHERAL VASCULAR CATHETERIZATION N/A 01/11/2016   Procedure: Lower Extremity Angiography;  Surgeon: Nada LibmanVance W Brabham, MD;  [facility];  Service: Cardiovascular;  Laterality: N/A;  . STERNOTOMY     Social History:   reports that he has quit smoking. His smoking use included Cigarettes. He has a 5.40 pack-year smoking history. He has never used smokeless tobacco. He reports that he drinks alcohol. He reports that he uses drugs, including Cocaine and Marijuana.  Family History  Problem Relation Age of Onset  . Hypertension Other   . Diabetes Other   . Mental illness Other   . Lupus Other   . Diabetes Father   . Cancer Neg Hx   . Heart disease Neg Hx   . Stroke Neg Hx  Medications: Patient's Medications  New Prescriptions   No medications on file  Previous Medications   ACETAMINOPHEN (TYLENOL) 325 MG TABLET    Take 650 mg by mouth every 6 (six) hours as needed for mild pain, moderate pain or fever.   AMITRIPTYLINE (ELAVIL) 25 MG TABLET    Take 25 mg by mouth at bedtime.   ATORVASTATIN (LIPITOR) 40 MG TABLET    Take 40 mg by mouth daily.   CARVEDILOL (COREG) 6.25 MG TABLET    Take 6.25 mg by mouth 2 (two) times daily with a meal.   DOCUSATE SODIUM (COLACE) 100 MG CAPSULE    Take 1 capsule (100 mg total) by mouth 2 (two) times daily.   FERROUS SULFATE 325 (65 FE) MG  TABLET    Take 325 mg by mouth 2 (two) times daily with a meal.   FUROSEMIDE (LASIX) 40 MG TABLET    Take 40 mg by mouth daily.    HYDRALAZINE (APRESOLINE) 25 MG TABLET    Take 25 mg by mouth 3 (three) times daily.   INSULIN GLARGINE (LANTUS) 100 UNIT/ML SOPN    Inject 17 Units into the skin at bedtime.   IPRATROPIUM-ALBUTEROL (DUONEB) 0.5-2.5 (3) MG/3ML SOLN    Take 3 mLs by nebulization every 6 (six) hours as needed.   ISOSORBIDE MONONITRATE (IMDUR) 30 MG 24 HR TABLET    Take 1 tablet (30 mg total) by mouth daily.   LEVOTHYROXINE (SYNTHROID, LEVOTHROID) 25 MCG TABLET    Take 25 mcg by mouth daily before breakfast.   NICOTINE (NICODERM CQ - DOSED IN MG/24 HOURS) 14 MG/24HR PATCH    Place 14 mg onto the skin daily.   NITROGLYCERIN (NITROSTAT) 0.4 MG SL TABLET    Place 0.4 mg under the tongue every 5 (five) minutes as needed for chest pain.   OXYGEN    Inhale into the lungs. 2lpm via [place] prn for shortness of breath or O2 sat <90%   PREGABALIN (LYRICA) 100 MG CAPSULE    Take one capsule by mouth every 8 hours for pains. Do not crush (control)   TRAMADOL (ULTRAM) 50 MG TABLET    Take 50 mg by mouth every 8 (eight) hours as needed. For breakthrough pain   WARFARIN (COUMADIN) 4 MG TABLET    Take 4 mg by mouth daily.  Modified Medications   No medications on file  Discontinued Medications   WARFARIN (COUMADIN) 6 MG TABLET    Take 6 mg by mouth daily at 6 PM.      Physical Exam: Vitals:   05/07/16 1342  BP: 132/82  Pulse: 84  Resp: 20  Temp: (!) 96.8 F (36 C)  SpO2: 99%  Weight: 206 lb 3.2 oz (93.5 kg)  Height: 5\' 10"  (1.778 m)    Physical Exam  Constitutional: He is oriented to person, place, and time. He appears well-developed and well-nourished. No distress.  HENT:  Head: Normocephalic and atraumatic.  Mouth/Throat: Oropharynx is clear and moist. No oropharyngeal exudate.  Eyes: Conjunctivae and EOM are normal. Pupils are equal, round, and reactive to light.  Neck: Normal range of  motion. Neck supple.  Cardiovascular: Normal rate, regular rhythm and normal heart sounds.   Diminished pedal pulse to left   Pulmonary/Chest: Effort normal and breath sounds normal.  Abdominal: Soft. Bowel sounds are normal.  Musculoskeletal: He exhibits no edema or tenderness.  Right AKA with well healed incision   Neurological: He is alert and oriented to person, place, and time.  Skin: Skin is warm and dry. He is not diaphoretic.  Dressing to left foot CDI  Psychiatric: He has a normal mood and affect.    Labs reviewed: Basic Metabolic Panel:  Recent Labs  40/98/11 0615 04/07/16 0332 04/08/16 0932 04/13/16 04/18/16  NA 135 134* 134* 137 141  K 4.7 4.6 4.7 4.5 4.8  CL 109 107 108  --   --   CO2 19* 19* 21*  --   --   GLUCOSE 139* 119* 126*  --   --   BUN 38* 38* 37* 20 21  CREATININE 2.38* 2.40* 1.90* 1.1 1.1  CALCIUM 8.3* 8.3* 8.3*  --   --    Liver Function Tests:  Recent Labs  12/31/15 0824 01/07/16 0732 04/02/16 1046  AST 50* 28 16  ALT 50 19 12*  ALKPHOS 176* 165* 119  BILITOT 0.5 0.8 1.0  PROT 6.3* 6.9 6.8  ALBUMIN 1.5* 1.5* 2.7*   No results for input(s): LIPASE, AMYLASE in the last 8760 hours.  Recent Labs  04/02/16 1121  AMMONIA 53*   CBC:  Recent Labs  12/26/15 1908  04/02/16 1046 04/04/16 0356 04/05/16 0423 04/06/16 0615  WBC 26.1*  < > 12.9* 12.0* 10.3 12.2*  NEUTROABS 23.2*  --  9.7*  --   --   --   HGB 12.2*  < > 9.1* 9.0* 8.8* 8.8*  HCT 36.4*  < > 28.9* 28.8* 29.0* 28.6*  MCV 89.4  < > 93.8 92.6 94.2 93.2  PLT 337  < > 185 213 217 234  < > = values in this interval not displayed. TSH:  Recent Labs  03/03/16  TSH 4.44   A1C: Lab Results  Component Value Date   HGBA1C 6.5 (H) 04/02/2016   Lipid Panel: No results for input(s): CHOL, HDL, LDLCALC, TRIG, CHOLHDL, LDLDIRECT in the last 8760 hours. Lab Results  Component Value Date   INR 2.4 (A) 04/30/2016   INR 2.6 (A) 04/23/2016   INR 2.3 (A) 04/18/2016   PROTIME 26.1  (A) 04/30/2016   PROTIME 27.8 (A) 04/23/2016   PROTIME 25.4 (A) 04/18/2016    Assessment/Plan 1. Paroxysmal atrial fibrillation (HCC) Rate controlled, conts on coumadin, INR drawn today, no signs of bleeding or bruising noted   2. Polyneuropathy associated with underlying disease (HCC) conts on lyrica every 8 hours   3. Diabetes mellitus with peripheral vascular disease (HCC) A1c at goal in December. To cont lantus.  4. Reactive depression Will start cymbalta 30 mg daily to help with mood. Also encouraged pt to get OOB more frequently.   5. PAD (peripheral artery disease) (HCC) S/p amputation of 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, following by vascular and wound care in facility   6. Chronic pain syndrome Percocet has been stopped due to multiple uses of narcan due to oversedation. Pt reports pain is controlled at this time. Has ultram if needed     Jessica K. Biagio Borg  [facility] [phone] 8 am - 5 pm) [phone] (after hours)

## 2016-05-13 ENCOUNTER — Ambulatory Visit: Payer: Medicare Other | Admitting: Vascular Surgery

## 2016-05-15 ENCOUNTER — Inpatient Hospital Stay (HOSPITAL_COMMUNITY)
Admission: EM | Admit: 2016-05-15 | Discharge: 2016-05-21 | DRG: 378 | Disposition: A | Discharge status: Skilled Nursing Facility | Payer: Medicare Other | Attending: Internal Medicine | Admitting: Internal Medicine

## 2016-05-15 ENCOUNTER — Encounter: Payer: Self-pay | Admitting: Internal Medicine

## 2016-05-15 ENCOUNTER — Non-Acute Institutional Stay (SKILLED_NURSING_FACILITY): Payer: Medicare Other | Admitting: Internal Medicine

## 2016-05-15 ENCOUNTER — Encounter (HOSPITAL_COMMUNITY): Payer: Self-pay | Admitting: Emergency Medicine

## 2016-05-15 DIAGNOSIS — K921 Melena: Principal | ICD-10-CM | POA: Diagnosis present

## 2016-05-15 DIAGNOSIS — I480 Paroxysmal atrial fibrillation: Secondary | ICD-10-CM | POA: Diagnosis present

## 2016-05-15 DIAGNOSIS — Z89611 Acquired absence of right leg above knee: Secondary | ICD-10-CM

## 2016-05-15 DIAGNOSIS — Z87891 Personal history of nicotine dependence: Secondary | ICD-10-CM

## 2016-05-15 DIAGNOSIS — B182 Chronic viral hepatitis C: Secondary | ICD-10-CM | POA: Diagnosis present

## 2016-05-15 DIAGNOSIS — R188 Other ascites: Secondary | ICD-10-CM | POA: Diagnosis present

## 2016-05-15 DIAGNOSIS — K7290 Hepatic failure, unspecified without coma: Secondary | ICD-10-CM | POA: Diagnosis present

## 2016-05-15 DIAGNOSIS — Z8673 Personal history of transient ischemic attack (TIA), and cerebral infarction without residual deficits: Secondary | ICD-10-CM

## 2016-05-15 DIAGNOSIS — K625 Hemorrhage of anus and rectum: Secondary | ICD-10-CM

## 2016-05-15 DIAGNOSIS — I252 Old myocardial infarction: Secondary | ICD-10-CM

## 2016-05-15 DIAGNOSIS — Z9189 Other specified personal risk factors, not elsewhere classified: Secondary | ICD-10-CM

## 2016-05-15 DIAGNOSIS — Z720 Tobacco use: Secondary | ICD-10-CM | POA: Diagnosis present

## 2016-05-15 DIAGNOSIS — Z7901 Long term (current) use of anticoagulants: Secondary | ICD-10-CM

## 2016-05-15 DIAGNOSIS — K5710 Diverticulosis of small intestine without perforation or abscess without bleeding: Secondary | ICD-10-CM | POA: Diagnosis present

## 2016-05-15 DIAGNOSIS — I2510 Atherosclerotic heart disease of native coronary artery without angina pectoris: Secondary | ICD-10-CM | POA: Diagnosis present

## 2016-05-15 DIAGNOSIS — Z794 Long term (current) use of insulin: Secondary | ICD-10-CM

## 2016-05-15 DIAGNOSIS — E118 Type 2 diabetes mellitus with unspecified complications: Secondary | ICD-10-CM

## 2016-05-15 DIAGNOSIS — IMO0002 Reserved for concepts with insufficient information to code with codable children: Secondary | ICD-10-CM | POA: Diagnosis present

## 2016-05-15 DIAGNOSIS — E1165 Type 2 diabetes mellitus with hyperglycemia: Secondary | ICD-10-CM | POA: Diagnosis present

## 2016-05-15 DIAGNOSIS — Z951 Presence of aortocoronary bypass graft: Secondary | ICD-10-CM

## 2016-05-15 DIAGNOSIS — K449 Diaphragmatic hernia without obstruction or gangrene: Secondary | ICD-10-CM | POA: Diagnosis present

## 2016-05-15 DIAGNOSIS — D62 Acute posthemorrhagic anemia: Secondary | ICD-10-CM | POA: Diagnosis present

## 2016-05-15 DIAGNOSIS — E039 Hypothyroidism, unspecified: Secondary | ICD-10-CM | POA: Diagnosis not present

## 2016-05-15 DIAGNOSIS — K922 Gastrointestinal hemorrhage, unspecified: Secondary | ICD-10-CM | POA: Diagnosis not present

## 2016-05-15 DIAGNOSIS — K7460 Unspecified cirrhosis of liver: Secondary | ICD-10-CM | POA: Diagnosis present

## 2016-05-15 DIAGNOSIS — E1151 Type 2 diabetes mellitus with diabetic peripheral angiopathy without gangrene: Secondary | ICD-10-CM | POA: Diagnosis present

## 2016-05-15 LAB — COMPREHENSIVE METABOLIC PANEL
ALBUMIN: 2.2 g/dL — AB (ref 3.5–5.0)
ALT: 9 U/L — ABNORMAL LOW (ref 17–63)
ANION GAP: 6 (ref 5–15)
AST: 16 U/L (ref 15–41)
Alkaline Phosphatase: 109 U/L (ref 38–126)
BUN: 35 mg/dL — AB (ref 6–20)
CHLORIDE: 107 mmol/L (ref 101–111)
CO2: 27 mmol/L (ref 22–32)
Calcium: 8.2 mg/dL — ABNORMAL LOW (ref 8.9–10.3)
Creatinine, Ser: 1.08 mg/dL (ref 0.61–1.24)
GFR calc Af Amer: >60 mL/min (ref >60)
GFR calc non Af Amer: >60 mL/min (ref >60)
GLUCOSE: 110 mg/dL — AB (ref 65–99)
POTASSIUM: 4.1 mmol/L (ref 3.5–5.1)
Sodium: 140 mmol/L (ref 135–145)
TOTAL PROTEIN: 6.5 g/dL (ref 6.5–8.1)
Total Bilirubin: 0.6 mg/dL (ref 0.3–1.2)

## 2016-05-15 LAB — I-STAT CHEM 8, ED
BUN: 36 mg/dL — ABNORMAL HIGH (ref 6–20)
CALCIUM ION: 1.13 mmol/L — AB (ref 1.15–1.40)
Chloride: 104 mmol/L (ref 101–111)
Creatinine, Ser: 1.1 mg/dL (ref 0.61–1.24)
Glucose, Bld: 105 mg/dL — ABNORMAL HIGH (ref 65–99)
HEMATOCRIT: 26 % — AB (ref 39.0–52.0)
Hemoglobin: 8.8 g/dL — ABNORMAL LOW (ref 13.0–17.0)
Potassium: 4 mmol/L (ref 3.5–5.1)
SODIUM: 141 mmol/L (ref 135–145)
TCO2: 29 mmol/L (ref 0–100)

## 2016-05-15 LAB — CBC WITH DIFFERENTIAL/PLATELET
BASOS PCT: 0 %
Basophils Absolute: 0 10*3/uL (ref 0.0–0.1)
EOS ABS: 0.3 10*3/uL (ref 0.0–0.7)
Eosinophils Relative: 4 %
HCT: 26.6 % — ABNORMAL LOW (ref 39.0–52.0)
Hemoglobin: 8.2 g/dL — ABNORMAL LOW (ref 13.0–17.0)
Lymphocytes Relative: 23 %
Lymphs Abs: 1.5 10*3/uL (ref 0.7–4.0)
MCH: 26.8 pg (ref 26.0–34.0)
MCHC: 30.8 g/dL (ref 30.0–36.0)
MCV: 86.9 fL (ref 78.0–100.0)
MONO ABS: 0.8 10*3/uL (ref 0.1–1.0)
MONOS PCT: 11 %
NEUTROS PCT: 62 %
Neutro Abs: 4 10*3/uL (ref 1.7–7.7)
Platelets: 230 10*3/uL (ref 150–400)
RBC: 3.06 MIL/uL — ABNORMAL LOW (ref 4.22–5.81)
RDW: 16 % — ABNORMAL HIGH (ref 11.5–15.5)
WBC: 6.6 10*3/uL (ref 4.0–10.5)

## 2016-05-15 LAB — CBC
HEMATOCRIT: 24.7 % — AB (ref 39.0–52.0)
Hemoglobin: 7.5 g/dL — ABNORMAL LOW (ref 13.0–17.0)
MCH: 26.1 pg (ref 26.0–34.0)
MCHC: 30.4 g/dL (ref 30.0–36.0)
MCV: 86.1 fL (ref 78.0–100.0)
Platelets: 236 10*3/uL (ref 150–400)
RBC: 2.87 MIL/uL — AB (ref 4.22–5.81)
RDW: 16 % — AB (ref 11.5–15.5)
WBC: 6.1 10*3/uL (ref 4.0–10.5)

## 2016-05-15 LAB — PROTIME-INR
INR: 2.46
PROTHROMBIN TIME: 27.1 s — AB (ref 11.4–15.2)

## 2016-05-15 LAB — POC OCCULT BLOOD, ED: FECAL OCCULT BLD: POSITIVE — AB

## 2016-05-15 LAB — GLUCOSE, CAPILLARY: Glucose-Capillary: 94 mg/dL (ref 65–99)

## 2016-05-15 MED ORDER — ACETAMINOPHEN 325 MG PO TABS
650.0000 mg | ORAL_TABLET | Freq: Four times a day (QID) | ORAL | Status: DC
  Administered 2016-05-15 – 2016-05-21 (×21): 650 mg via ORAL
  Filled 2016-05-15 (×22): qty 2

## 2016-05-15 MED ORDER — NICOTINE 14 MG/24HR TD PT24
14.0000 mg | MEDICATED_PATCH | Freq: Every day | TRANSDERMAL | Status: DC
  Administered 2016-05-16 – 2016-05-21 (×5): 14 mg via TRANSDERMAL
  Filled 2016-05-15 (×6): qty 1

## 2016-05-15 MED ORDER — NITROGLYCERIN 0.4 MG SL SUBL
0.4000 mg | SUBLINGUAL_TABLET | SUBLINGUAL | Status: DC | PRN
Start: 2016-05-15 — End: 2016-05-21

## 2016-05-15 MED ORDER — SODIUM CHLORIDE 0.9 % IV SOLN
80.0000 mg | Freq: Once | INTRAVENOUS | Status: AC
  Administered 2016-05-15: 17:00:00 80 mg via INTRAVENOUS
  Filled 2016-05-15: qty 80

## 2016-05-15 MED ORDER — SODIUM CHLORIDE 0.9 % IV SOLN
8.0000 mg/h | INTRAVENOUS | Status: DC
  Administered 2016-05-15 – 2016-05-17 (×5): 8 mg/h via INTRAVENOUS
  Filled 2016-05-15 (×12): qty 80

## 2016-05-15 MED ORDER — PANTOPRAZOLE SODIUM 40 MG IV SOLR: 40.0000 mg | Freq: Two times a day (BID) | INTRAVENOUS | Status: DC

## 2016-05-15 MED ORDER — LEVOTHYROXINE SODIUM 25 MCG PO TABS
25.0000 ug | ORAL_TABLET | Freq: Every day | ORAL | Status: DC
  Administered 2016-05-16 – 2016-05-21 (×6): 25 ug via ORAL
  Filled 2016-05-15 (×6): qty 1

## 2016-05-15 MED ORDER — KCL IN DEXTROSE-NACL 20-5-0.45 MEQ/L-%-% IV SOLN
INTRAVENOUS | Status: DC
  Administered 2016-05-15 – 2016-05-21 (×6): via INTRAVENOUS
  Filled 2016-05-15 (×7): qty 1000

## 2016-05-15 MED ORDER — PREGABALIN 100 MG PO CAPS
100.0000 mg | ORAL_CAPSULE | Freq: Three times a day (TID) | ORAL | Status: DC
  Administered 2016-05-15 – 2016-05-21 (×15): 100 mg via ORAL
  Filled 2016-05-15 (×16): qty 1

## 2016-05-15 MED ORDER — TRAMADOL HCL 50 MG PO TABS
50.0000 mg | ORAL_TABLET | Freq: Three times a day (TID) | ORAL | Status: DC | PRN
  Administered 2016-05-17: 50 mg via ORAL
  Filled 2016-05-15: qty 1

## 2016-05-15 MED ORDER — AMITRIPTYLINE HCL 25 MG PO TABS
25.0000 mg | ORAL_TABLET | Freq: Every day | ORAL | Status: DC
  Administered 2016-05-15 – 2016-05-20 (×6): 25 mg via ORAL
  Filled 2016-05-15 (×6): qty 1

## 2016-05-15 MED ORDER — INSULIN ASPART 100 UNIT/ML ~~LOC~~ SOLN
0.0000 [IU] | Freq: Three times a day (TID) | SUBCUTANEOUS | Status: DC
  Administered 2016-05-16 – 2016-05-17 (×3): 1 [IU] via SUBCUTANEOUS
  Administered 2016-05-17: 2 [IU] via SUBCUTANEOUS
  Administered 2016-05-19: 3 [IU] via SUBCUTANEOUS
  Administered 2016-05-19 – 2016-05-20 (×3): 1 [IU] via SUBCUTANEOUS
  Administered 2016-05-20 – 2016-05-21 (×3): 2 [IU] via SUBCUTANEOUS
  Administered 2016-05-21: 1 [IU] via SUBCUTANEOUS

## 2016-05-15 MED ORDER — CARVEDILOL 6.25 MG PO TABS
6.2500 mg | ORAL_TABLET | Freq: Two times a day (BID) | ORAL | Status: DC
  Administered 2016-05-15 – 2016-05-21 (×13): 6.25 mg via ORAL
  Filled 2016-05-15 (×13): qty 1

## 2016-05-15 MED ORDER — SODIUM CHLORIDE 0.9 % IV SOLN
80.0000 mg | Freq: Once | INTRAVENOUS | Status: AC
  Administered 2016-05-15: 19:00:00 80 mg via INTRAVENOUS
  Filled 2016-05-15 (×2): qty 80

## 2016-05-15 MED ORDER — DULOXETINE HCL 30 MG PO CPEP
30.0000 mg | ORAL_CAPSULE | Freq: Every day | ORAL | Status: DC
  Administered 2016-05-15 – 2016-05-21 (×6): 30 mg via ORAL
  Filled 2016-05-15 (×6): qty 1

## 2016-05-15 MED ORDER — IPRATROPIUM-ALBUTEROL 0.5-2.5 (3) MG/3ML IN SOLN: 3.0000 mL | Freq: Four times a day (QID) | RESPIRATORY_TRACT | Status: DC | PRN

## 2016-05-15 NOTE — Progress Notes (Signed)
   [facility] Room: 113 Full Code  Tilden DomeSCHNEIDER,RICHARD, MD No address on file  This is a nursing facility follow up for specific acute issue of melena and rectal bleeding.  Nursing Facility readmission within 30 days  Interim medical record and care [facility] was updated with review of diagnostic studies and change in clinical status since last visit were documented.  HPI: This morning the patient passed "black stool" with " drops of blood" by his history. This was followed by a large amount of melenous stool mixed with blood. He denies any active GI symptoms By history he's had colonoscopy in 2013. He denies a history of polyps. He's never had an EGD. He has been on warfarin, his PT/INR was 2.5 on 05/14/16. He has no bleeding dyscrasias. He states he is having regular bowel movements for at least the last 3 days. He does not want to take Colace twice a day.  Review of systems: Initially he gave the date as 04/14/16. When I told him the correct date he did remember that it was February. He was able to identify the president. Epistaxis, hemoptysis, hematuria denied. No unexplained weight loss, significant dyspepsia,dysphagia, or abdominal pain.  There is no abnormal bruising , bleeding, or difficulty stopping bleeding with injury.   Physical exam:  Pertinent or positive findings: His head is shaven. He has a mustache and close cropped beard.He has a few mandibular teeth but is otherwise edentulous. There is minor rales on the left anterior chest. Heart rate is slow. Abdomen is distended but not tightly so. It is nontender to palpation. Bowel sounds are markedly distant. He has an occasional rare bowel sound without the classic tinkling of ileus. There is marked edema of the penis, but the foreskin can be pulled back. The scrotum reveals residual edema. He has an AKA on the right. Fourth and fifth left toes are amputated. The remaining toenails  are dark and thickened. Foot is dressed. No rectal was performed as stool was visable in a clear plastic bag. It was frankly melanous with appearance of maroon blood. It was foul-smelling.  General appearance:Adequately nourished; no acute distress , increased work of breathing is present.   Lymphatic: No lymphadenopathy about the head, neck, axilla . Eyes: No conjunctival inflammation or lid edema is present. There is no scleral icterus. Ears:  External ear exam shows no significant lesions or deformities.   Nose:  External nasal examination shows no deformity or inflammation. Nasal mucosa are pink and moist without lesions ,exudates Oral exam: lips and gums are healthy appearing.There is no oropharyngeal erythema or exudate . Neck:  No thyromegaly, masses, tenderness noted.    Heart:  Normal rate and regular rhythm. S1 and S2 normal without gallop, murmur, click, rub .  Skin: Warm & dry w/o tenting. No significant lesions or rash.  Assessment/plan: #1 melena and rectal bleeding  In context of a therapeutic PT/INR He was sent to the emergency room to determine the etiology of the gastric bleeding. Upper GI bleeding is suggested by the melena.

## 2016-05-15 NOTE — ED Provider Notes (Signed)
[facility]-EMERGENCY DEPT Provider Note   CSN: [id number] Arrival date & time: 05/15/16  1253     History   Chief Complaint Chief Complaint  Patient presents with  . GI Bleeding   HPI   Blood pressure 134/80, pulse 87, temperature 98.4 F (36.9 C), temperature source Oral, resp. rate 15, SpO2 98 %.  Ronald Simpson is a 71 y.o. male  sent from SNF for dark stool which was noticed this morning. Patient has A. fib and is anticoagulated with warfarin. On my exam patient is nonverbal. No complaints.   Past Medical History:  Diagnosis Date  . CAD (coronary artery disease)    01/15/16 he states he has had a heart attack  . CVA (cerebral infarction)   . DM (diabetes mellitus), type 2 with peripheral vascular complications (HCC)   . History of hepatitis B   . History of hepatitis C   . Peripheral vascular disease [facility])     Patient Active Problem List   Diagnosis Date Noted  . GI bleed 05/15/2016  . Altered mental status 04/10/2016  . Gangrene of toe of left foot (HCC)   . Cystitis   . 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 At risk for adverse drug event 02/14/2016  . Peripheral neuropathy (HCC) 01/31/2016  . Scrotal edema 01/15/2016  . Dry gangrene (HCC) 01/15/2016  . Renal insufficiency 01/15/2016  . PAD (peripheral artery disease) (HCC) 01/11/2016  . 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 Tobacco abuse   . Tachycardia   . Hyponatremia   . Hypoalbuminemia   . MRSA cellulitis of left foot   . Serratia marcescens infection (HCC)   . Escherichia coli infection   . CAD in native artery   . Paroxysmal atrial fibrillation (HCC)   . Uncontrolled type 2 diabetes mellitus with complication (HCC)   . Hepatitis C without hepatic coma 12/27/2015  . Coronary artery disease due to lipid rich plaque 12/27/2015  . Diabetes mellitus with peripheral vascular disease (HCC) 12/27/2015  . History of CVA  (cerebrovascular accident) 12/27/2015  . AKI (acute kidney injury) (HCC) 12/27/2015  . Hypothyroidism, acquired 12/27/2015  . Benign essential HTN 12/27/2015  . Sepsis (HCC) 12/26/2015    Past Surgical History:  Procedure Laterality Date  . AMPUTATION Right 01/07/2016   Procedure: AMPUTATION ABOVE KNEE;  Surgeon: Larina Earthly, MD;  Location: [facility] OR;  Service: Vascular;  Laterality: Right;  . AMPUTATION Left 04/04/2016   Procedure: LEFT FOURTH AND FIFTH  TOE AMPUTATION;  Surgeon: Larina Earthly, MD;  Location: [facility] OR;  Service: Vascular;  Laterality: Left;  . BELOW KNEE LEG AMPUTATION Right   . CORONARY ARTERY BYPASS GRAFT  2005  . PERIPHERAL VASCULAR CATHETERIZATION N/A 01/11/2016   Procedure: Lower Extremity Angiography;  Surgeon: Nada Libman, MD;  Location: [facility] INVASIVE CV LAB;  Service: Cardiovascular;  Laterality: N/A;  . STERNOTOMY         Home Medications    Prior to Admission medications   Medication Sig Start Date End Date Taking? Authorizing Provider  acetaminophen (TYLENOL) 325 MG tablet Take 650 mg by mouth every 6 (six) hours.    Yes Historical Provider, MD  amitriptyline (ELAVIL) 25 MG tablet Take 25 mg by mouth at bedtime.   Yes Historical Provider, MD  atorvastatin (LIPITOR) 40 MG tablet Take 40 mg by mouth daily.   Yes Historical Provider, MD  barrier cream (NON-SPECIFIED) CREA Apply 1 application  topically 2 (two) times daily.   Yes Historical Provider, MD  carvedilol (COREG) 6.25 MG tablet Take 6.25 mg by mouth 2 (two) times daily with a meal.   Yes Historical Provider, MD  docusate sodium (COLACE) 100 MG capsule Take 100 mg by mouth 2 (two) times daily.   Yes Historical Provider, MD  DULoxetine (CYMBALTA) 30 MG capsule Take 30 mg by mouth daily.   Yes Historical Provider, MD  ferrous sulfate 325 (65 FE) MG tablet Take 325 mg by mouth 2 (two) times daily with a meal.   Yes Historical Provider, MD  furosemide (LASIX) 40 MG tablet Take 40 mg by mouth daily.    Yes  Historical Provider, MD  hydrALAZINE (APRESOLINE) 25 MG tablet Take 25 mg by mouth 3 (three) times daily.   Yes Historical Provider, MD  insulin glargine (LANTUS) 100 unit/mL SOPN Inject 17 Units into the skin at bedtime.   Yes Historical Provider, MD  ipratropium-albuterol (DUONEB) 0.5-2.5 (3) MG/3ML SOLN Take 3 mLs by nebulization every 6 (six) hours as needed (shortness of breath).    Yes Historical Provider, MD  isosorbide mononitrate (IMDUR) 30 MG 24 hr tablet Take 1 tablet (30 mg total) by mouth daily. 01/14/16  Yes Jeralyn BennettEzequiel Zamora, MD  levothyroxine (SYNTHROID, LEVOTHROID) 25 MCG tablet Take 25 mcg by mouth daily before breakfast.   Yes Historical Provider, MD  nicotine (NICODERM CQ - DOSED IN MG/24 HOURS) 14 mg/24hr patch Place 14 mg onto the skin daily.   Yes Historical Provider, MD  nitroGLYCERIN (NITROSTAT) 0.4 MG SL tablet Place 0.4 mg under the tongue every 5 (five) minutes as needed for chest pain.   Yes Historical Provider, MD  OXYGEN Inhale into the lungs. 2lpm via [place] prn for shortness of breath or O2 sat <90%   Yes Historical Provider, MD  pregabalin (LYRICA) 100 MG capsule Take one capsule by mouth every 8 hours for pains. Do not crush (control) Patient taking differently: Take 100 mg by mouth every 8 (eight) hours.  04/28/16  Yes Tiffany L Reed, DO  traMADol (ULTRAM) 50 MG tablet Take 50 mg by mouth every 8 (eight) hours as needed for moderate pain.    Yes Historical Provider, MD  warfarin (COUMADIN) 4 MG tablet Take 4 mg by mouth daily at 6 PM.   Yes Historical Provider, MD    Family History Family History  Problem Relation Age of Onset  . Hypertension Other   . Diabetes Other   . Mental illness Other   . Lupus Other   . Diabetes Father   . Cancer Neg Hx   . Heart disease Neg Hx   . Stroke Neg Hx     Social History Social History  Substance Use Topics  . Smoking status: Former Smoker    Packs/day: 0.10    Years: 54.00    Types: Cigarettes  . Smokeless tobacco:  Never Used     Comment: On NicoDerm patch at SNF  . Alcohol use Yes     Comment: 04/02/2016 "aint suppose to drink in the nursing home"     Allergies   Percocet [oxycodone-acetaminophen]   Review of Systems Review of Systems  10 systems reviewed and found to be negative, except as noted in the HPI.  Physical Exam Updated Vital Signs BP 136/77   Pulse 84   Temp 98.4 F (36.9 C) (Oral)   Resp 15   SpO2 100%   Physical Exam  Constitutional: He appears well-developed and well-nourished. No distress.  HENT:  Head: Normocephalic and atraumatic.  Mouth/Throat: Oropharynx is clear and moist.  Eyes: Conjunctivae and EOM are normal. Pupils are equal, round, and reactive to light.  Neck: Normal range of motion.  Cardiovascular: Normal rate, regular rhythm and intact distal pulses.   Pulmonary/Chest: Effort normal and breath sounds normal.  Abdominal: Soft. There is no tenderness.  Genitourinary:  Genitourinary Comments: Digital rectal exam a chaperoned by technician: Melanotic stool with BRB.   Uncircumcised penis edematous with no tenderness or hematomas.  Musculoskeletal: Normal range of motion.  Right lower extremity amputation, dressing in place to left heel  Neurological: He is alert.  Skin: He is not diaphoretic.  Psychiatric: He has a normal mood and affect.  Nursing note and vitals reviewed.    ED Treatments / Results  Labs (all labs ordered are listed, but only abnormal results are displayed) Labs Reviewed  PROTIME-INR - Abnormal; Notable for the following:       Result Value   Prothrombin Time 27.1 (*)    All other components within normal limits  CBC WITH DIFFERENTIAL/PLATELET - Abnormal; Notable for the following:    RBC 3.06 (*)    Hemoglobin 8.2 (*)    HCT 26.6 (*)    RDW 16.0 (*)    All other components within normal limits  COMPREHENSIVE METABOLIC PANEL - Abnormal; Notable for the following:    Glucose, Bld 110 (*)    BUN 35 (*)    Calcium 8.2 (*)     Albumin 2.2 (*)    ALT 9 (*)    All other components within normal limits  I-STAT CHEM 8, ED - Abnormal; Notable for the following:    BUN 36 (*)    Glucose, Bld 105 (*)    Calcium, Ion 1.13 (*)    Hemoglobin 8.8 (*)    HCT 26.0 (*)    All other components within normal limits  POC OCCULT BLOOD, ED - Abnormal; Notable for the following:    Fecal Occult Bld POSITIVE (*)    All other components within normal limits  CBG MONITORING, ED  TYPE AND SCREEN    EKG  EKG Interpretation None       Radiology No results found.  Procedures Procedures (including critical care time)  Medications Ordered in ED Medications  pantoprazole (PROTONIX) 80 mg in sodium chloride 0.9 % 100 mL IVPB (not administered)     Initial Impression / Assessment and Plan / ED Course  I have reviewed the triage vital signs and the nursing notes.  Pertinent labs & imaging results that were available during my care of the patient were reviewed by me and considered in my medical decision making (see chart for details).     Vitals:   05/15/16 1515 05/15/16 1530 05/15/16 1600 05/15/16 1615  BP: 128/89 134/81 129/80 136/77  Pulse: 85 85 89 84  Resp:      Temp:      TempSrc:      SpO2: 100% 100% 99% 100%    Medications  pantoprazole (PROTONIX) 80 mg in sodium chloride 0.9 % 100 mL IVPB (not administered)    Atlee Kluth is 71 y.o. male presenting with Melanotic stool, he is anticoagulated with Coumadin for A. fib. Vital signs stable, he has waxing and waning clarity. Patient does state that he would not be opposed to blood products if needed.  He denies any abdominal pain.  Patient's hemoglobin is 8.2, this is down from 8.8 last month. He does have  an elevated BUN consistent with upper GI bleed of 36.  Case discussed with Dr. Cena Benton who accepts admission.  Gastric tetralogy consult from [facility] appreciated: They will evaluate the patient in the morning, agrees with holding  Coumadin   Final Clinical Impressions(s) / ED Diagnoses   Final diagnoses:  Acute GI bleeding  Chronic anticoagulation    New Prescriptions New Prescriptions   No medications on file     Wynetta Emery, PA-C 05/15/16 1645    Azalia Bilis, MD 05/16/16 [phone]

## 2016-05-15 NOTE — Patient Instructions (Signed)
Nonemergency transport to the emergency room to evaluate the asymptomatic melena/rectal bleeding.

## 2016-05-15 NOTE — Assessment & Plan Note (Signed)
Narcotics had been weaned; he agrees to not renewing these medications because of the risk involved

## 2016-05-15 NOTE — ED Triage Notes (Signed)
Pt here from nursing home with c/o one dark stool today , no complaint at this time

## 2016-05-15 NOTE — Progress Notes (Signed)
Called by ED PA.  Pt on Coumadin and passing "melena" at SNF.  Hgb is 8.2. Was 8.8 6 weeks ago.  INR is 2.4.   Will plan to see pt in AM but call on call GI if needed.

## 2016-05-15 NOTE — H&P (Signed)
History and Physical    [name] XLK:[id number] DOB: [birth year] DOA: 05/15/2016  PCP: [name], MD  Patient coming from: Skilled nursing facility  Chief Complaint: Patient reports dark stools  HPI: Ronald Simpson is a 71 y.o. male with medical history significant of paroxysmal atrial fibrillation on Coumadin and beta blocker. Presenting to the hospital after patient was found to have melanotic stools. Patient states that the problem started today. Nothing he is aware of makes it better or worse. The problem has been persistent since onset. The melanotic stools or not associated with abdominal discomfort  ED Course: Patient was found to have a hemoglobin of 8.8 and we were subsequently consulted for further medical evaluation recommendations regarding reported melanotic stools.  Review of Systems: As per HPI otherwise 10 point review of systems negative.    Past Medical History:  Diagnosis Date  . CAD (coronary artery disease)    01/15/16 he states he has had a heart attack  . CVA (cerebral infarction)   . DM (diabetes mellitus), type 2 with peripheral vascular complications (HCC)   . History of hepatitis B   . History of hepatitis C   . Peripheral vascular disease [facility])     Past Surgical History:  Procedure Laterality Date  . AMPUTATION Right 01/07/2016   Procedure: AMPUTATION ABOVE KNEE;  Surgeon: Larina Earthly, MD;  Location: [facility] OR;  Service: Vascular;  Laterality: Right;  . AMPUTATION Left 04/04/2016   Procedure: LEFT FOURTH AND FIFTH  TOE AMPUTATION;  Surgeon: Larina Earthly, MD;  Location: [facility] OR;  Service: Vascular;  Laterality: Left;  . BELOW KNEE LEG AMPUTATION Right   . CORONARY ARTERY BYPASS GRAFT  2005  . PERIPHERAL VASCULAR CATHETERIZATION N/A 01/11/2016   Procedure: Lower Extremity Angiography;  Surgeon: Nada Libman, MD;  Location: [facility] INVASIVE CV LAB;  Service: Cardiovascular;  Laterality: N/A;  . STERNOTOMY       reports that he has quit smoking. His  smoking use included Cigarettes. He has a 5.40 pack-year smoking history. He has never used smokeless tobacco. He reports that he drinks alcohol. He reports that he uses drugs, including Cocaine and Marijuana.  Allergies  Allergen Reactions  . Percocet [Oxycodone-Acetaminophen]     Needing narcan on multiple occasions due to oversedation after getting percocet     Family History  Problem Relation Age of Onset  . Hypertension Other   . Diabetes Other   . Mental illness Other   . Lupus Other   . Diabetes Father   . Cancer Neg Hx   . Heart disease Neg Hx   . Stroke Neg Hx      Prior to Admission medications   Medication Sig Start Date End Date Taking? Authorizing Provider  acetaminophen (TYLENOL) 325 MG tablet Take 650 mg by mouth every 6 (six) hours.    Yes Historical Provider, MD  amitriptyline (ELAVIL) 25 MG tablet Take 25 mg by mouth at bedtime.   Yes Historical Provider, MD  atorvastatin (LIPITOR) 40 MG tablet Take 40 mg by mouth daily.   Yes Historical Provider, MD  barrier cream (NON-SPECIFIED) CREA Apply 1 application topically 2 (two) times daily.   Yes Historical Provider, MD  carvedilol (COREG) 6.25 MG tablet Take 6.25 mg by mouth 2 (two) times daily with a meal.   Yes Historical Provider, MD  docusate sodium (COLACE) 100 MG capsule Take 100 mg by mouth 2 (two) times daily.   Yes Historical Provider, MD  DULoxetine (CYMBALTA) 30 MG capsule  Take 30 mg by mouth daily.   Yes Historical Provider, MD  ferrous sulfate 325 (65 FE) MG tablet Take 325 mg by mouth 2 (two) times daily with a meal.   Yes Historical Provider, MD  furosemide (LASIX) 40 MG tablet Take 40 mg by mouth daily.    Yes Historical Provider, MD  hydrALAZINE (APRESOLINE) 25 MG tablet Take 25 mg by mouth 3 (three) times daily.   Yes Historical Provider, MD  insulin glargine (LANTUS) 100 unit/mL SOPN Inject 17 Units into the skin at bedtime.   Yes Historical Provider, MD  ipratropium-albuterol (DUONEB) 0.5-2.5 (3)  MG/3ML SOLN Take 3 mLs by nebulization every 6 (six) hours as needed (shortness of breath).    Yes Historical Provider, MD  isosorbide mononitrate (IMDUR) 30 MG 24 hr tablet Take 1 tablet (30 mg total) by mouth daily. 01/14/16  Yes Jeralyn Bennett, MD  levothyroxine (SYNTHROID, LEVOTHROID) 25 MCG tablet Take 25 mcg by mouth daily before breakfast.   Yes Historical Provider, MD  nicotine (NICODERM CQ - DOSED IN MG/24 HOURS) 14 mg/24hr patch Place 14 mg onto the skin daily.   Yes Historical Provider, MD  nitroGLYCERIN (NITROSTAT) 0.4 MG SL tablet Place 0.4 mg under the tongue every 5 (five) minutes as needed for chest pain.   Yes Historical Provider, MD  OXYGEN Inhale into the lungs. 2lpm via [place] prn for shortness of breath or O2 sat <90%   Yes Historical Provider, MD  pregabalin (LYRICA) 100 MG capsule Take one capsule by mouth every 8 hours for pains. Do not crush (control) Patient taking differently: Take 100 mg by mouth every 8 (eight) hours.  04/28/16  Yes Tiffany L Reed, DO  traMADol (ULTRAM) 50 MG tablet Take 50 mg by mouth every 8 (eight) hours as needed for moderate pain.    Yes Historical Provider, MD  warfarin (COUMADIN) 4 MG tablet Take 4 mg by mouth daily at 6 PM.   Yes Historical Provider, MD    Physical Exam: Vitals:   05/15/16 1530 05/15/16 1600 05/15/16 1615 05/15/16 1645  BP: 134/81 129/80 136/77 139/74  Pulse: 85 89 84 84  Resp:      Temp:      TempSrc:      SpO2: 100% 99% 100% 100%      Constitutional: NAD, calm, comfortable Vitals:   05/15/16 1530 05/15/16 1600 05/15/16 1615 05/15/16 1645  BP: 134/81 129/80 136/77 139/74  Pulse: 85 89 84 84  Resp:      Temp:      TempSrc:      SpO2: 100% 99% 100% 100%   Eyes: PERRL, lids and conjunctivae normal ENMT: Mucous membranes are moist. Posterior pharynx clear of any exudate or lesions. Neck: normal, supple, no masses, no thyromegaly Respiratory: clear to auscultation bilaterally, no wheezing, no crackles. Normal  respiratory effort. No accessory muscle use.  Cardiovascular: Regular rate and rhythm, no murmurs / rubs / gallops. No extremity edema. 2+ pedal pulses. No carotid bruits.  Abdomen: no tenderness, no masses palpated. No hepatosplenomegaly. Bowel sounds positive.  Musculoskeletal: R AKA, equal tone at upper extremities. Skin: no rashes on limited exam. Guaze over left foot Neurologic: No facial asymmetry, answers questions appropriately. Psychiatric: Normal mood and affect  Labs on Admission: I have personally reviewed following labs and imaging studies  CBC:  Recent Labs Lab 05/15/16 1440 05/15/16 1521  WBC 6.6  --   NEUTROABS 4.0  --   HGB 8.2* 8.8*  HCT 26.6* 26.0*  MCV  86.9  --   PLT 230  --    Basic Metabolic Panel:  Recent Labs Lab 05/15/16 1440 05/15/16 1521  NA 140 141  K 4.1 4.0  CL 107 104  CO2 27  --   GLUCOSE 110* 105*  BUN 35* 36*  CREATININE 1.08 1.10  CALCIUM 8.2*  --    GFR: Estimated Creatinine Clearance: 73.8 mL/min (by C-G formula based on SCr of 1.1 mg/dL). Liver Function Tests:  Recent Labs Lab 05/15/16 1440  AST 16  ALT 9*  ALKPHOS 109  BILITOT 0.6  PROT 6.5  ALBUMIN 2.2*   No results for input(s): LIPASE, AMYLASE in the last 168 hours. No results for input(s): AMMONIA in the last 168 hours. Coagulation Profile:  Recent Labs Lab 05/15/16 1440  INR 2.46   Cardiac Enzymes: No results for input(s): CKTOTAL, CKMB, CKMBINDEX, TROPONINI in the last 168 hours. BNP (last 3 results) No results for input(s): PROBNP in the last 8760 hours. HbA1C: No results for input(s): HGBA1C in the last 72 hours. CBG: No results for input(s): GLUCAP in the last 168 hours. Lipid Profile: No results for input(s): CHOL, HDL, LDLCALC, TRIG, CHOLHDL, LDLDIRECT in the last 72 hours. Thyroid Function Tests: No results for input(s): TSH, T4TOTAL, FREET4, T3FREE, THYROIDAB in the last 72 hours. Anemia Panel: No results for input(s): VITAMINB12, FOLATE,  FERRITIN, TIBC, IRON, RETICCTPCT in the last 72 hours. Urine analysis:    Component Value Date/Time   COLORURINE AMBER (A) 04/05/2016 1100   APPEARANCEUR CLOUDY (A) 04/05/2016 1100   LABSPEC 1.029 04/05/2016 1100   PHURINE 5.0 04/05/2016 1100   GLUCOSEU NEGATIVE 04/05/2016 1100   HGBUR SMALL (A) 04/05/2016 1100   BILIRUBINUR NEGATIVE 04/05/2016 1100   KETONESUR NEGATIVE 04/05/2016 1100   PROTEINUR 100 (A) 04/05/2016 1100   NITRITE NEGATIVE 04/05/2016 1100   LEUKOCYTESUR MODERATE (A) 04/05/2016 1100   Sepsis Labs: !!!!!!!!!!!!!!!!!!!!!!!!!!!!!!!!!!!!!!!!!!!! @LABRCNTIP (procalcitonin:4,lacticidven:4) )No results found for this or any previous visit (from the past 240 hour(s)).   Radiological Exams on Admission: No results found.  EKG: Independently reviewed.   Assessment/Plan Active Problems:   GI bleed/Melanotic stools - GI consulted by ED personnel  - serial cbc q 8 hours - clear liquid diet, then npo after midnight.  PAF - Hold coumadin as a result of main problem - continue B blocker.  DM type 2 - Once able to advance diet will place on diabetic diet - Will hold lantus while patient not eating as much as he does at home. - continue SSI  Peripheral neuropathy - continue lyrica  Hypothyroidism - Pt on synthroid.  DVT prophylaxis: Hold secondary to suspected active bleeding Code Status: Full Family Communication: d/c patient directly no fam at bedside. Disposition Plan: transition to floor Consults called: GI Admission status: observation   Penny PiaVEGA, Yovanna Cogan MD [facility] Pager [phone]  If 7PM-7AM, please contact night-coverage [email or web address] Password [email or web address]  05/15/2016, 5:17 PM

## 2016-05-15 NOTE — ED Notes (Signed)
Attempted report 

## 2016-05-16 ENCOUNTER — Encounter (HOSPITAL_COMMUNITY): Payer: Self-pay | Admitting: Physician Assistant

## 2016-05-16 ENCOUNTER — Observation Stay (HOSPITAL_COMMUNITY): Payer: Medicare Other

## 2016-05-16 DIAGNOSIS — D62 Acute posthemorrhagic anemia: Secondary | ICD-10-CM

## 2016-05-16 DIAGNOSIS — Z794 Long term (current) use of insulin: Secondary | ICD-10-CM | POA: Diagnosis not present

## 2016-05-16 DIAGNOSIS — K7290 Hepatic failure, unspecified without coma: Secondary | ICD-10-CM | POA: Diagnosis present

## 2016-05-16 DIAGNOSIS — E1151 Type 2 diabetes mellitus with diabetic peripheral angiopathy without gangrene: Secondary | ICD-10-CM | POA: Diagnosis present

## 2016-05-16 DIAGNOSIS — E039 Hypothyroidism, unspecified: Secondary | ICD-10-CM | POA: Diagnosis present

## 2016-05-16 DIAGNOSIS — I2510 Atherosclerotic heart disease of native coronary artery without angina pectoris: Secondary | ICD-10-CM | POA: Diagnosis present

## 2016-05-16 DIAGNOSIS — Z7901 Long term (current) use of anticoagulants: Secondary | ICD-10-CM

## 2016-05-16 DIAGNOSIS — Z89611 Acquired absence of right leg above knee: Secondary | ICD-10-CM | POA: Diagnosis not present

## 2016-05-16 DIAGNOSIS — B182 Chronic viral hepatitis C: Secondary | ICD-10-CM | POA: Diagnosis present

## 2016-05-16 DIAGNOSIS — Z87891 Personal history of nicotine dependence: Secondary | ICD-10-CM | POA: Diagnosis not present

## 2016-05-16 DIAGNOSIS — E1165 Type 2 diabetes mellitus with hyperglycemia: Secondary | ICD-10-CM | POA: Diagnosis present

## 2016-05-16 DIAGNOSIS — K922 Gastrointestinal hemorrhage, unspecified: Secondary | ICD-10-CM | POA: Diagnosis present

## 2016-05-16 DIAGNOSIS — K921 Melena: Secondary | ICD-10-CM | POA: Diagnosis present

## 2016-05-16 DIAGNOSIS — K7460 Unspecified cirrhosis of liver: Secondary | ICD-10-CM | POA: Diagnosis present

## 2016-05-16 DIAGNOSIS — Z951 Presence of aortocoronary bypass graft: Secondary | ICD-10-CM | POA: Diagnosis not present

## 2016-05-16 DIAGNOSIS — K5710 Diverticulosis of small intestine without perforation or abscess without bleeding: Secondary | ICD-10-CM | POA: Diagnosis present

## 2016-05-16 DIAGNOSIS — K449 Diaphragmatic hernia without obstruction or gangrene: Secondary | ICD-10-CM | POA: Diagnosis present

## 2016-05-16 DIAGNOSIS — I480 Paroxysmal atrial fibrillation: Secondary | ICD-10-CM | POA: Diagnosis present

## 2016-05-16 DIAGNOSIS — Z8673 Personal history of transient ischemic attack (TIA), and cerebral infarction without residual deficits: Secondary | ICD-10-CM | POA: Diagnosis not present

## 2016-05-16 DIAGNOSIS — I252 Old myocardial infarction: Secondary | ICD-10-CM | POA: Diagnosis not present

## 2016-05-16 DIAGNOSIS — R188 Other ascites: Secondary | ICD-10-CM | POA: Diagnosis present

## 2016-05-16 LAB — CBC
HCT: 22.3 % — ABNORMAL LOW (ref 39.0–52.0)
HCT: 27.8 % — ABNORMAL LOW (ref 39.0–52.0)
HEMATOCRIT: 25.3 % — AB (ref 39.0–52.0)
HEMOGLOBIN: 6.8 g/dL — AB (ref 13.0–17.0)
Hemoglobin: 7.8 g/dL — ABNORMAL LOW (ref 13.0–17.0)
Hemoglobin: 8.7 g/dL — ABNORMAL LOW (ref 13.0–17.0)
MCH: 26.6 pg (ref 26.0–34.0)
MCH: 26.5 pg (ref 26.0–34.0)
MCH: 26.9 pg (ref 26.0–34.0)
MCHC: 30.5 g/dL (ref 30.0–36.0)
MCHC: 30.8 g/dL (ref 30.0–36.0)
MCHC: 31.3 g/dL (ref 30.0–36.0)
MCV: 87.1 fL (ref 78.0–100.0)
MCV: 86.1 fL (ref 78.0–100.0)
MCV: 85.8 fL (ref 78.0–100.0)
PLATELETS: 210 10*3/uL (ref 150–400)
PLATELETS: 206 10*3/uL (ref 150–400)
PLATELETS: 198 10*3/uL (ref 150–400)
RBC: 2.56 MIL/uL — ABNORMAL LOW (ref 4.22–5.81)
RBC: 2.94 MIL/uL — ABNORMAL LOW (ref 4.22–5.81)
RBC: 3.24 MIL/uL — AB (ref 4.22–5.81)
RDW: 16.2 % — AB (ref 11.5–15.5)
RDW: 15.9 % — AB (ref 11.5–15.5)
RDW: 15.8 % — ABNORMAL HIGH (ref 11.5–15.5)
WBC: 4.7 10*3/uL (ref 4.0–10.5)
WBC: 5.1 10*3/uL (ref 4.0–10.5)
WBC: 5.6 10*3/uL (ref 4.0–10.5)

## 2016-05-16 LAB — BASIC METABOLIC PANEL
Anion gap: 7 (ref 5–15)
BUN: 35 mg/dL — AB (ref 6–20)
CO2: 26 mmol/L (ref 22–32)
CREATININE: 1.12 mg/dL (ref 0.61–1.24)
Calcium: 7.9 mg/dL — ABNORMAL LOW (ref 8.9–10.3)
Chloride: 105 mmol/L (ref 101–111)
GFR calc Af Amer: >60 mL/min (ref >60)
GLUCOSE: 214 mg/dL — AB (ref 65–99)
POTASSIUM: 3.9 mmol/L (ref 3.5–5.1)
Sodium: 138 mmol/L (ref 135–145)

## 2016-05-16 LAB — GLUCOSE, CAPILLARY
GLUCOSE-CAPILLARY: 149 mg/dL — AB (ref 65–99)
GLUCOSE-CAPILLARY: 118 mg/dL — AB (ref 65–99)
GLUCOSE-CAPILLARY: 149 mg/dL — AB (ref 65–99)

## 2016-05-16 LAB — PROTIME-INR
INR: 2.82
Prothrombin Time: 30.2 seconds — ABNORMAL HIGH (ref 11.4–15.2)

## 2016-05-16 LAB — PREPARE RBC (CROSSMATCH)

## 2016-05-16 LAB — MRSA PCR SCREENING: MRSA BY PCR: POSITIVE — AB

## 2016-05-16 MED ORDER — MUPIROCIN 2 % EX OINT
1.0000 "application " | TOPICAL_OINTMENT | Freq: Two times a day (BID) | CUTANEOUS | Status: DC
  Administered 2016-05-17 – 2016-05-21 (×9): 1 via NASAL
  Filled 2016-05-16 (×5): qty 22

## 2016-05-16 MED ORDER — SODIUM CHLORIDE 0.9 % IV SOLN
Freq: Once | INTRAVENOUS | Status: AC
  Administered 2016-05-16: 06:00:00 via INTRAVENOUS

## 2016-05-16 MED ORDER — CHLORHEXIDINE GLUCONATE CLOTH 2 % EX PADS
6.0000 | MEDICATED_PAD | Freq: Every day | CUTANEOUS | Status: AC
  Administered 2016-05-17 – 2016-05-21 (×5): 6 via TOPICAL

## 2016-05-16 MED ORDER — WHITE PETROLATUM GEL
Status: AC
  Administered 2016-05-16: 07:00:00
  Filled 2016-05-16: qty 1

## 2016-05-16 MED ORDER — SODIUM CHLORIDE 0.9 % IV SOLN: INTRAVENOUS | Status: DC

## 2016-05-16 MED ORDER — VITAMIN K1 10 MG/ML IJ SOLN
5.0000 mg | Freq: Once | INTRAMUSCULAR | Status: AC
  Administered 2016-05-16: 5 mg via INTRAVENOUS
  Filled 2016-05-16: qty 0.5
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
## 2016-05-16 NOTE — Progress Notes (Signed)
PROGRESS NOTE    Ronald Simpson  [id number] DOB: 1945-08-12 DOA: 05/15/2016 PCP: Tilden DomeSCHNEIDER,RICHARD, MD   Brief Narrative:   71 y.o. male with medical history significant of paroxysmal atrial fibrillation on Coumadin and beta blocker. Presenting to the hospital after patient was found to have melanotic stools   Assessment & Plan:   GI bleed/Melanotic stools - GI consulted, continue protonix, work up pending decrease in INR - continue cbc monitoring.  Active Problems:   Hypothyroidism, acquired - stable continue synthroid    DVT prophylaxis: none given risk of bleed Code Status: Full Family Communication: None at bedside Disposition Plan: pending work up results   Consultants:   GI   Procedures: Pending   Antimicrobials: none   Subjective: Pt has no new complaints.   Objective: Vitals:   05/16/16 0959 05/16/16 1053 05/16/16 1127 05/16/16 1436  BP: 121/69 117/86 133/72 133/76  Pulse: 75 76 76 77  Resp: 16 18 18 16   Temp: 98.3 F (36.8 C) 97.6 F (36.4 C) 97.4 F (36.3 C) 97.6 F (36.4 C)  TempSrc: Oral Oral Oral Oral  SpO2: 100% 100% 100% 100%  Weight:      Height:        Intake/Output Summary (Last 24 hours) at 05/16/16 1612 Last data filed at 05/16/16 1442  Gross per 24 hour  Intake             1408 ml  Output              276 ml  Net             1132 ml   Filed Weights   05/15/16 1900  Weight: 88 kg (194 lb 0.1 oz)    Examination:  General exam: Appears calm and comfortable, in nad. Respiratory system: Clear to auscultation. Respiratory effort normal. Cardiovascular system: S1 & S2 heard, RRR. No JVD, murmurs, rubs, gallops or clicks. No pedal edema. Gastrointestinal system: Abdomen is nondistended, soft and nontender. Hypoactive bowel sounds Central nervous system: Alert and oriented. No focal neurological deficits. Extremities: Symmetric 5 x 5 power. Skin: No rashes, lesions or ulcers, on limited exam. Psychiatry: Mood & affect  appropriate.   Data Reviewed: I have personally reviewed following labs and imaging studies  CBC:  Recent Labs Lab 05/15/16 1440 05/15/16 1521 05/15/16 1804 05/16/16 0219 05/16/16 1004  WBC 6.6  --  6.1 4.7 5.1  NEUTROABS 4.0  --   --   --   --   HGB 8.2* 8.8* 7.5* 6.8* 7.8*  HCT 26.6* 26.0* 24.7* 22.3* 25.3*  MCV 86.9  --  86.1 87.1 86.1  PLT 230  --  236 210 206   Basic Metabolic Panel:  Recent Labs Lab 05/15/16 1440 05/15/16 1521 05/16/16 0219  NA 140 141 138  K 4.1 4.0 3.9  CL 107 104 105  CO2 27  --  26  GLUCOSE 110* 105* 214*  BUN 35* 36* 35*  CREATININE 1.08 1.10 1.12  CALCIUM 8.2*  --  7.9*   GFR: Estimated Creatinine Clearance: 70.5 mL/min (by C-G formula based on SCr of 1.12 mg/dL). Liver Function Tests:  Recent Labs Lab 05/15/16 1440  AST 16  ALT 9*  ALKPHOS 109  BILITOT 0.6  PROT 6.5  ALBUMIN 2.2*   No results for input(s): LIPASE, AMYLASE in the last 168 hours. No results for input(s): AMMONIA in the last 168 hours. Coagulation Profile:  Recent Labs Lab 05/15/16 1440 05/16/16 1004  INR 2.46 2.82  Cardiac Enzymes: No results for input(s): CKTOTAL, CKMB, CKMBINDEX, TROPONINI in the last 168 hours. BNP (last 3 results) No results for input(s): PROBNP in the last 8760 hours. HbA1C: No results for input(s): HGBA1C in the last 72 hours. CBG:  Recent Labs Lab 05/15/16 1825 05/16/16 0803 05/16/16 1146  GLUCAP 94 149* 118*   Lipid Profile: No results for input(s): CHOL, HDL, LDLCALC, TRIG, CHOLHDL, LDLDIRECT in the last 72 hours. Thyroid Function Tests: No results for input(s): TSH, T4TOTAL, FREET4, T3FREE, THYROIDAB in the last 72 hours. Anemia Panel: No results for input(s): VITAMINB12, FOLATE, FERRITIN, TIBC, IRON, RETICCTPCT in the last 72 hours. Sepsis Labs: No results for input(s): PROCALCITON, LATICACIDVEN in the last 168 hours.  No results found for this or any previous visit (from the past 240 hour(s)).    Radiology  Studies: No results found.  Scheduled Meds: . acetaminophen  650 mg Oral Q6H  . amitriptyline  25 mg Oral QHS  . carvedilol  6.25 mg Oral BID WC  . DULoxetine  30 mg Oral Daily  . insulin aspart  0-9 Units Subcutaneous TID WC  . levothyroxine  25 mcg Oral QAC breakfast  . nicotine  14 mg Transdermal Daily  . [START ON 05/19/2016] pantoprazole  40 mg Intravenous Q12H  . phytonadione (VITAMIN K) IV  5 mg Intravenous Once  . pregabalin  100 mg Oral Q8H   Continuous Infusions: . dextrose 5 % and 0.45 % NaCl with KCl 20 mEq/L 50 mL/hr at 05/15/16 1851  . pantoprozole (PROTONIX) infusion 8 mg/hr (05/16/16 0610)     LOS: 0 days   Time spent: > 35 minutes  Penny Pia, MD [facility] Pager [phone]  If 7PM-7AM, please contact night-coverage [email or web address] Password [email or web address] 05/16/2016, 4:12 PM

## 2016-05-16 NOTE — NC FL2 (Signed)
[place] MEDICAID FL2 LEVEL OF CARE SCREENING TOOL     IDENTIFICATION  Patient Name: Ronald Simpson Birthdate: Mar 28, 1946 Sex: male Admission Date (Current Location): 05/15/2016  [facility] and [place] Number:  Producer, television/film/video and Address:  The [place], [address], [place], [place] [address]      Provider Number: [id number]  Attending Physician Name and Address:  Penny Pia, MD  Relative Name and Phone Number:  Riddick, Nu[place] Daughter; [phone]     Current Level of Care: Hospital Recommended Level of Care: Skilled Nursing Facility (Patient from [facility]) Prior Approval Number:    Date Approved/Denied:   PASRR Number: [id number]  Discharge Plan: SNF    Current Diagnoses: Patient Active Problem List   Diagnosis Date Noted  . GI bleed 05/15/2016  . Melanotic stools 05/15/2016  . Altered mental status 04/10/2016  . Gangrene of toe of left foot (HCC)   . Cystitis   . 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 At risk for adverse drug event 02/14/2016  . Peripheral neuropathy (HCC) 01/31/2016  . Scrotal edema 01/15/2016  . Dry gangrene (HCC) 01/15/2016  . Renal insufficiency 01/15/2016  . PAD (peripheral artery disease) (HCC) 01/11/2016  . 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 Tobacco abuse   . Tachycardia   . Hyponatremia   . Hypoalbuminemia   . MRSA cellulitis of left foot   . Serratia marcescens infection (HCC)   . Escherichia coli infection   . CAD in native artery   . Paroxysmal atrial fibrillation (HCC)   . Uncontrolled type 2 diabetes mellitus with complication (HCC)   . Hepatitis C without hepatic coma 12/27/2015  . Coronary artery disease due to lipid rich plaque 12/27/2015  . Diabetes mellitus with peripheral vascular disease (HCC) 12/27/2015  . History of CVA (cerebrovascular accident) 12/27/2015  . AKI (acute kidney  injury) (HCC) 12/27/2015  . Hypothyroidism, acquired 12/27/2015  . Benign essential HTN 12/27/2015  . Sepsis (HCC) 12/26/2015    Orientation RESPIRATION BLADDER Height & Weight     Self, Situation, Place  O2 (2 Liters Oxygen) Continent Weight: 194 lb 0.1 oz (88 kg) Height:  5\' 10"  (177.8 cm)  BEHAVIORAL SYMPTOMS/MOOD NEUROLOGICAL BOWEL NUTRITION STATUS      Incontinent Diet (Full Liquid eff. 2/2)  AMBULATORY STATUS COMMUNICATION OF NEEDS Skin   Limited Assist Verbally  (Wound to left foot (toe amputation) with drainage)                       Personal Care Assistance Level of Assistance  Bathing, Feeding, Dressing Bathing Assistance: Limited assistance Feeding assistance: Independent Dressing Assistance: Limited assistance     Functional Limitations Info  Sight, Hearing, Speech Sight Info: Adequate Hearing Info: Adequate Speech Info: Adequate    SPECIAL CARE FACTORS FREQUENCY                       Contractures Contractures Info: Not present    Additional Factors Info  Code Status, Allergies, Insulin Sliding Scale Code Status Info: Full Allergies Info: Percocet   Insulin Sliding Scale Info: 0-9 Units 3X per day with meals       Current Medications (05/16/2016):  This is the current hospital active medication list Current Facility-Administered Medications  Medication Dose Route Frequency Provider Last Rate Last Dose  . acetaminophen (TYLENOL) tablet 650 mg  650 mg  Oral Q6H Penny Piarlando Vega, MD   650 mg at 05/16/16 1148  . amitriptyline (ELAVIL) tablet 25 mg  25 mg Oral QHS Penny Piarlando Vega, MD   25 mg at 05/15/16 2210  . carvedilol (COREG) tablet 6.25 mg  6.25 mg Oral BID WC Penny Piarlando Vega, MD   6.25 mg at 05/16/16 0800  . dextrose 5 % and 0.45 % NaCl with KCl 20 mEq/L infusion   Intravenous Continuous Penny Piarlando Vega, MD 50 mL/hr at 05/15/16 1851    . DULoxetine (CYMBALTA) DR capsule 30 mg  30 mg Oral Daily Penny Piarlando Vega, MD   30 mg at 05/16/16 1147  . insulin aspart  (novoLOG) injection 0-9 Units  0-9 Units Subcutaneous TID WC Penny Piarlando Vega, MD   1 Units at 05/16/16 0800  . ipratropium-albuterol (DUONEB) 0.5-2.5 (3) MG/3ML nebulizer solution 3 mL  3 mL Nebulization Q6H PRN Penny Piarlando Vega, MD      . levothyroxine (SYNTHROID, LEVOTHROID) tablet 25 mcg  25 mcg Oral QAC breakfast Penny Piarlando Vega, MD   25 mcg at 05/16/16 0730  . nicotine (NICODERM CQ - dosed in mg/24 hours) patch 14 mg  14 mg Transdermal Daily Penny Piarlando Vega, MD   14 mg at 05/16/16 1147  . nitroGLYCERIN (NITROSTAT) SL tablet 0.4 mg  0.4 mg Sublingual Q5 min PRN Penny Piarlando Vega, MD      . pantoprazole (PROTONIX) 80 mg in sodium chloride 0.9 % 250 mL (0.32 mg/mL) infusion  8 mg/hr Intravenous Continuous Penny Piarlando Vega, MD 25 mL/hr at 05/16/16 0610 8 mg/hr at 05/16/16 0610  . [START ON 05/19/2016] pantoprazole (PROTONIX) injection 40 mg  40 mg Intravenous Q12H Penny Piarlando Vega, MD      . phytonadione (VITAMIN K) 5 mg in dextrose 5 % 50 mL IVPB  5 mg Intravenous Once Dianah FieldSarah J Gribbin, PA-C      . pregabalin (LYRICA) capsule 100 mg  100 mg Oral Q8H Penny Piarlando Vega, MD   100 mg at 05/15/16 2211  . traMADol (ULTRAM) tablet 50 mg  50 mg Oral Q8H PRN Penny Piarlando Vega, MD         Discharge Medications: Please see discharge summary for a list of discharge medications.  Relevant Imaging Results:  Relevant Lab Results:   Additional Information [id number]  Cristobal GoldmannCrawford, Radwan Cowley Bradley, LCSW

## 2016-05-16 NOTE — Progress Notes (Signed)
IV fluids/meds held this am while patient received 2 u of blood. Called pharmacy and fluids, and protonix not compatible. Made two attempts to start IV and called IV team. IV team made one attempt and then patient refused. Dicussed importance of IV meds and reason for second IV site. Patient is now agreeable, IV team consult ordered.

## 2016-05-16 NOTE — Clinical Social Work Note (Signed)
Clinical Social Work Assessment  Patient Details  Name: Ronald Simpson MRN: [id number] Date of Birth: 1946/01/02  Date of referral:  05/16/16               Reason for consult:  Facility Placement (Patient from [facility])                Permission sought to share information with:  Family Supports Permission granted to share information::  Yes, Verbal Permission Granted  Name::     Deforest HoylesLisa Vavra  Agency::     Relationship::  Daughter  Contact Information:  [phone]  Housing/Transportation Living arrangements for the past 2 months:  Skilled Nursing Facility (From [facility]) Source of Information:  Patient, Other (Comment Required) (Chart) Patient Interpreter Needed:  None Criminal Activity/Legal Involvement Pertinent to Current Situation/Hospitalization:  No - Comment as needed Significant Relationships:  Other(Comment) ([facility]) Lives with:  Facility Resident Do you feel safe going back to the place where you live?  Yes Need for family participation in patient care:  Yes (Comment)  Care giving concerns:  None expressed by patient regarding care at nursing facility.   Social Worker assessment / plan:  CSW visited with patient at the bedside and talked with him regarding discharge plan. Patient was lying in bed and was awake, alert and agreeable to talking with CSW. Mr. [name] reported that he does not plan to [facility] to [facility] when medically stable. He gave CSW permission to contact his daughter Deforest HoylesLisa Puchalski when he is ready for discharge.  Employment status:  Retired Health and safety inspectornsurance information:  Medicare PT Recommendations:  Not assessed at this time Information / Referral to community resources:  Other (Comment Required) (None needed or requested as patient from skilled facility)  Patient/Family's Response to care:  No concerns expressed by patient regarding care during hospitalization.  Patient/Family's Understanding of and Emotional  Response to Diagnosis, Current Treatment, and Prognosis: Not discussed.  Emotional Assessment Appearance:  Appears stated age Attitude/Demeanor/Rapport:  Other (Appropriate) Affect (typically observed):  Appropriate Orientation:  Oriented to Self, Oriented to Place, Oriented to Situation Alcohol / Substance use:  Tobacco Use, Alcohol Use, Illicit Drugs (Patient reports that he quit smoking, drinks alcohol and uses drugs, including cocanine and marijuana) Psych involvement (Current and /or in the community):  No (Comment)  Discharge Needs  Concerns to be addressed:  Discharge Planning Concerns Readmission within the last 30 days:  No Current discharge risk:  None Barriers to Discharge:  No Barriers Identified   Cristobal GoldmannCrawford, Wyatte Dames Bradley, LCSW 05/16/2016, 3:15 PM

## 2016-05-16 NOTE — Care Management Obs Status (Signed)
MEDICARE OBSERVATION STATUS NOTIFICATION   Patient Details  Name: [name] Hunley MRN: [id number] Date of Birth: 1945/11/27   Medicare Observation Status Notification Given:  Yes    Lawerance Sabalebbie Anasha Perfecto, RN 05/16/2016, 2:17 PM

## 2016-05-16 NOTE — Consult Note (Addendum)
WOC Nurse wound consult note Reason for Consult: Consult requested for left foot wound.  Pt had amputation of 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s in December by Dr Early, according to the EMR.   Wound type: Full thickness post-op wound with delayed healing. Measurement: 4X6X1.2cm Wound bed: 40% yellow, 50% red, 10% black to wound edge. Drainage (amount, consistency, odor) large amt tan drainage with strong foul odor Periwound: intact skin surrounding Dressing procedure/placement/frequency: Previous dressing appears to be Hydrafera blue dressing, which is not available in the [facility] formulary.  Will substitute Aquacel to absorb drainage and provide antimicrobial benefits.  If further input is desired, please consider a consult to Dr Arbie CookeyEarly of the vascular service to assess left foot wound since he performed surgery in Dec.  Please re-consult if further assistance is needed.  Thank-you,  Cammie Mcgeeawn Jalayah Gutridge MSN, RN, CWOCN, WentworthWCN-AP, CNS [phone]

## 2016-05-17 ENCOUNTER — Encounter (HOSPITAL_COMMUNITY)
Admission: EM | Disposition: A | Discharge status: Skilled Nursing Facility | Payer: Self-pay | Source: Home / Self Care | Attending: Family Medicine

## 2016-05-17 ENCOUNTER — Encounter (HOSPITAL_COMMUNITY): Payer: Self-pay | Admitting: Gastroenterology

## 2016-05-17 DIAGNOSIS — K922 Gastrointestinal hemorrhage, unspecified: Secondary | ICD-10-CM

## 2016-05-17 HISTORY — PX: ESOPHAGOGASTRODUODENOSCOPY: SHX5428

## 2016-05-17 LAB — CBC
HCT: 27.9 % — ABNORMAL LOW (ref 39.0–52.0)
HEMATOCRIT: 26.2 % — AB (ref 39.0–52.0)
HEMATOCRIT: 26.4 % — AB (ref 39.0–52.0)
HEMATOCRIT: 27 % — AB (ref 39.0–52.0)
HEMOGLOBIN: 8.6 g/dL — AB (ref 13.0–17.0)
HEMOGLOBIN: 8.2 g/dL — AB (ref 13.0–17.0)
HEMOGLOBIN: 8.6 g/dL — AB (ref 13.0–17.0)
Hemoglobin: 8.3 g/dL — ABNORMAL LOW (ref 13.0–17.0)
MCH: 26.5 pg (ref 26.0–34.0)
MCH: 27 pg (ref 26.0–34.0)
MCH: 27.3 pg (ref 26.0–34.0)
MCH: 27.6 pg (ref 26.0–34.0)
MCHC: 30.8 g/dL (ref 30.0–36.0)
MCHC: 31.3 g/dL (ref 30.0–36.0)
MCHC: 31.4 g/dL (ref 30.0–36.0)
MCHC: 31.9 g/dL (ref 30.0–36.0)
MCV: 85.7 fL (ref 78.0–100.0)
MCV: 85.8 fL (ref 78.0–100.0)
MCV: 86.2 fL (ref 78.0–100.0)
MCV: 87.7 fL (ref 78.0–100.0)
Platelets: 194 10*3/uL (ref 150–400)
Platelets: 201 10*3/uL (ref 150–400)
Platelets: 202 10*3/uL (ref 150–400)
Platelets: 203 10*3/uL (ref 150–400)
RBC: 3.15 MIL/uL — AB (ref 4.22–5.81)
RBC: 3.01 MIL/uL — ABNORMAL LOW (ref 4.22–5.81)
RBC: 3.04 MIL/uL — AB (ref 4.22–5.81)
RBC: 3.25 MIL/uL — AB (ref 4.22–5.81)
RDW: 16 % — ABNORMAL HIGH (ref 11.5–15.5)
RDW: 16.3 % — ABNORMAL HIGH (ref 11.5–15.5)
RDW: 16.3 % — ABNORMAL HIGH (ref 11.5–15.5)
RDW: 16.4 % — ABNORMAL HIGH (ref 11.5–15.5)
WBC: 5.1 10*3/uL (ref 4.0–10.5)
WBC: 5.2 10*3/uL (ref 4.0–10.5)
WBC: 5.4 10*3/uL (ref 4.0–10.5)
WBC: 5.4 10*3/uL (ref 4.0–10.5)

## 2016-05-17 LAB — PROTIME-INR
INR: 1.56
PROTHROMBIN TIME: 18.9 s — AB (ref 11.4–15.2)

## 2016-05-17 LAB — TYPE AND SCREEN
BLOOD PRODUCT EXPIRATION DATE: 201802242359
BLOOD PRODUCT EXPIRATION DATE: 201802242359
ISSUE DATE / TIME: 201802020612
ISSUE DATE / TIME: 201802021055
UNIT TYPE AND RH: 5100
UNIT TYPE AND RH: 5100

## 2016-05-17 LAB — GLUCOSE, CAPILLARY
GLUCOSE-CAPILLARY: 129 mg/dL — AB (ref 65–99)
Glucose-Capillary: 141 mg/dL — ABNORMAL HIGH (ref 65–99)
Glucose-Capillary: 190 mg/dL — ABNORMAL HIGH (ref 65–99)
Glucose-Capillary: 139 mg/dL — ABNORMAL HIGH (ref 65–99)

## 2016-05-17 LAB — HEPATITIS B SURFACE ANTIGEN: Hepatitis B Surface Ag: NEGATIVE

## 2016-05-17 LAB — HEPATITIS C ANTIBODY (REFLEX)

## 2016-05-17 LAB — COMMENT2 - HEP PANEL

## 2016-05-17 LAB — HEPATITIS B SURFACE ANTIBODY,QUALITATIVE: HEP B S AB: REACTIVE

## 2016-05-17 SURGERY — EGD (ESOPHAGOGASTRODUODENOSCOPY)
Anesthesia: Moderate Sedation

## 2016-05-17 MED ORDER — PEG-KCL-NACL-NASULF-NA ASC-C 100 G PO SOLR
0.5000 | Freq: Once | ORAL | Status: AC
  Administered 2016-05-17: 100 g via ORAL

## 2016-05-17 MED ORDER — BUTAMBEN-TETRACAINE-BENZOCAINE 2-2-14 % EX AERO
INHALATION_SPRAY | CUTANEOUS | Status: DC | PRN
  Administered 2016-05-17: 2 via TOPICAL

## 2016-05-17 MED ORDER — PEG-KCL-NACL-NASULF-NA ASC-C 100 G PO SOLR
0.5000 | Freq: Once | ORAL | Status: AC
  Administered 2016-05-17: 100 g via ORAL
  Filled 2016-05-17: qty 1

## 2016-05-17 MED ORDER — FENTANYL CITRATE (PF) 100 MCG/2ML IJ SOLN
INTRAMUSCULAR | Status: AC
  Filled 2016-05-17: qty 2

## 2016-05-17 MED ORDER — MIDAZOLAM HCL 10 MG/2ML IJ SOLN
INTRAMUSCULAR | Status: DC | PRN
  Administered 2016-05-17: 2 mg via INTRAVENOUS

## 2016-05-17 MED ORDER — DIPHENHYDRAMINE HCL 50 MG/ML IJ SOLN
INTRAMUSCULAR | Status: AC
  Filled 2016-05-17: qty 1

## 2016-05-17 MED ORDER — MIDAZOLAM HCL 5 MG/ML IJ SOLN
INTRAMUSCULAR | Status: AC
  Filled 2016-05-17: qty 1

## 2016-05-17 MED ORDER — FENTANYL CITRATE (PF) 100 MCG/2ML IJ SOLN
INTRAMUSCULAR | Status: DC | PRN
  Administered 2016-05-17: 25 ug via INTRAVENOUS

## 2016-05-17 MED ORDER — PEG-KCL-NACL-NASULF-NA ASC-C 100 G PO SOLR: 1.0000 | Freq: Once | ORAL | Status: DC

## 2016-05-17 NOTE — Progress Notes (Signed)
Pt drowsy, easily aroused.  Denies pain.

## 2016-05-17 NOTE — Progress Notes (Signed)
PROGRESS NOTE    Ronald Simpson  TGY:[id number] DOB: 09/21/1945 DOA: 05/15/2016 PCP: Mayford Knife, MD   Brief Narrative:   71 y.o. male with medical history significant of paroxysmal atrial fibrillation on Coumadin and beta blocker. Presenting to the hospital after patient was found to have melanotic stools  Assessment & Plan:   GI bleed/Melanotic stools - GI consulted, continue protonix, work up pending decrease in INR - EGD completed and non revealing. Plan for colonoscopy next am.  Active Problems:   Hypothyroidism, acquired - stable continue synthroid    DVT prophylaxis: none given risk of bleed Code Status: Full Family Communication: None at bedside Disposition Plan: pending work up results   Consultants:   GI   Procedures: Pending   Antimicrobials: none   Subjective: Pt has no new complaints.   Objective: Vitals:   05/17/16 1015 05/17/16 1020 05/17/16 1030 05/17/16 1035  BP: (!) 144/102 (!) 139/96 114/78 123/68  Pulse: 81 81 78 77  Resp: 20 10 11 12   Temp:      TempSrc:      SpO2: 100% 100% 99% 99%  Weight:      Height:        Intake/Output Summary (Last 24 hours) at 05/17/16 1607 Last data filed at 05/17/16 1600  Gross per 24 hour  Intake          3538.33 ml  Output              225 ml  Net          3313.33 ml   Filed Weights   05/15/16 1900 05/16/16 2103  Weight: 88 kg (194 lb 0.1 oz) 89.7 kg (197 lb 12.8 oz)    Examination:  General exam: Appears calm and comfortable, in nad. Respiratory system: Clear to auscultation. Respiratory effort normal. Cardiovascular system: S1 & S2 heard, RRR. No JVD, murmurs, rubs, gallops or clicks. No pedal edema. Gastrointestinal system: Abdomen is nondistended, soft and nontender. Hypoactive bowel sounds Central nervous system: Alert and oriented. No focal neurological deficits. Extremities: Symmetric 5 x 5 power. Skin: No rashes, lesions or ulcers, on limited exam. Psychiatry: Mood & affect  appropriate.   Data Reviewed: I have personally reviewed following labs and imaging studies  CBC:  Recent Labs Lab 05/15/16 1440  05/16/16 1004 05/16/16 1625 05/16/16 2346 05/17/16 0150 05/17/16 0852  WBC 6.6  < > 5.1 5.6 5.4 5.1 5.4  NEUTROABS 4.0  --   --   --   --   --   --   HGB 8.2*  < > 7.8* 8.7* 8.2* 8.6* 8.6*  HCT 26.6*  < > 25.3* 27.8* 26.2* 27.0* 27.9*  MCV 86.9  < > 86.1 85.8 86.2 85.7 85.8  PLT 230  < > 206 198 202 194 201  < > = values in this interval not displayed. Basic Metabolic Panel:  Recent Labs Lab 05/15/16 1440 05/15/16 1521 05/16/16 0219  NA 140 141 138  K 4.1 4.0 3.9  CL 107 104 105  CO2 27  --  26  GLUCOSE 110* 105* 214*  BUN 35* 36* 35*  CREATININE 1.08 1.10 1.12  CALCIUM 8.2*  --  7.9*   GFR: Estimated Creatinine Clearance: 71.2 mL/min (by C-G formula based on SCr of 1.12 mg/dL). Liver Function Tests:  Recent Labs Lab 05/15/16 1440  AST 16  ALT 9*  ALKPHOS 109  BILITOT 0.6  PROT 6.5  ALBUMIN 2.2*   No results for input(s): LIPASE, AMYLASE in the  last 168 hours. No results for input(s): AMMONIA in the last 168 hours. Coagulation Profile:  Recent Labs Lab 05/15/16 1440 05/16/16 1004 05/17/16 0852  INR 2.46 2.82 1.56   Cardiac Enzymes: No results for input(s): CKTOTAL, CKMB, CKMBINDEX, TROPONINI in the last 168 hours. BNP (last 3 results) No results for input(s): PROBNP in the last 8760 hours. HbA1C: No results for input(s): HGBA1C in the last 72 hours. CBG:  Recent Labs Lab 05/16/16 0803 05/16/16 1146 05/16/16 1639 05/17/16 0808 05/17/16 1211  GLUCAP 149* 118* 149* 141* 129*   Lipid Profile: No results for input(s): CHOL, HDL, LDLCALC, TRIG, CHOLHDL, LDLDIRECT in the last 72 hours. Thyroid Function Tests: No results for input(s): TSH, T4TOTAL, FREET4, T3FREE, THYROIDAB in the last 72 hours. Anemia Panel: No results for input(s): VITAMINB12, FOLATE, FERRITIN, TIBC, IRON, RETICCTPCT in the last 72  hours. Sepsis Labs: No results for input(s): PROCALCITON, LATICACIDVEN in the last 168 hours.  Recent Results (from the past 240 hour(s))  MRSA PCR Screening     Status: Abnormal   Collection Time: 05/16/16  8:39 PM  Result Value Ref Range Status   MRSA by PCR POSITIVE (A) NEGATIVE Final    Comment:        The GeneXpert MRSA Assay (FDA approved for NASAL specimens only), is one component of a comprehensive MRSA colonization surveillance program. It is not intended to diagnose MRSA infection nor to guide or monitor treatment for MRSA infections. RESULT CALLED TO, READ BACK BY AND VERIFIED WITH: Angela Burke RN [phone] 05/16/16 A BROWNING       Radiology Studies: US Abdomen Limited Ruq  Result Date: 05/16/2016 CLINICAL DATA:  Cirrhosis, history of hepatitis-B and hepatitis-C, type II diabetes mellitus EXAM: US ABDOMEN LIMITED - RIGHT UPPER QUADRANT COMPARISON:  CT abdomen pelvis 12/27/2015 FINDINGS: Gallbladder: Partially contracted gallbladder. Gallbladder wall appears minimally thickened though this may be in part related to incomplete distention and in part due to ascites. No shadowing gallstones, pericholecystic fluid or sonographic Murphy sign. Common bile duct: Diameter: 4 mm diameter , normal Liver: Heterogeneous echogenicity. Slightly nodular hepatic margins consistent with cirrhosis. No discrete hepatic mass. No intrahepatic biliary dilatation. Significant ascites noted throughout the abdomen. IMPRESSION: Cirrhotic liver with significant ascites. Otherwise negative exam. Electronically Signed   By: Lavonia Dana M.D.   On: 05/16/2016 17:46    Scheduled Meds: . acetaminophen  650 mg Oral Q6H  . amitriptyline  25 mg Oral QHS  . carvedilol  6.25 mg Oral BID WC  . Chlorhexidine Gluconate Cloth  6 each Topical Q0600  . DULoxetine  30 mg Oral Daily  . insulin aspart  0-9 Units Subcutaneous TID WC  . levothyroxine  25 mcg Oral QAC breakfast  . mupirocin ointment  1 application Nasal BID   . nicotine  14 mg Transdermal Daily  . [START ON 05/19/2016] pantoprazole  40 mg Intravenous Q12H  . peg 3350 powder  0.5 kit Oral Once  . pregabalin  100 mg Oral Q8H   Continuous Infusions: . dextrose 5 % and 0.45 % NaCl with KCl 20 mEq/L 50 mL/hr at 05/16/16 1618  . pantoprozole (PROTONIX) infusion 8 mg/hr (05/17/16 1331)     LOS: 1 day   Time spent: > 35 minutes  Velvet Bathe, MD [facility] Pager [phone]  If 7PM-7AM, please contact night-coverage [email or web address] Password [email or web address] 05/17/2016, 4:07 PM
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
## 2016-05-17 NOTE — Interval H&P Note (Signed)
History and Physical Interval Note:  05/17/2016 9:45 AM  Ronald Simpson  has presented today for surgery, with the diagnosis of melena, acute on chronic anemia.  GI bleed.  The various methods of treatment have been discussed with the patient and family. After consideration of risks, benefits and other options for treatment, the patient has consented to  Procedure(s): ESOPHAGOGASTRODUODENOSCOPY (EGD) (N/A) as a surgical intervention .  The patient's history has been reviewed, patient examined, no change in status, stable for surgery.  I have reviewed the patient's chart and labs.  Questions were answered to the patient's satisfaction.     Venita LickMalcolm T. Russella DarStark

## 2016-05-17 NOTE — Op Note (Signed)
[facility] Patient Name: Ronald Simpson Procedure Date : 05/17/2016 MRN: [id number] Attending MD: Ronald Simpson , MD Date of Birth: Aug 13, 1945 CSN: [id number] Age: 71 Admit Type: Inpatient Procedure:                Upper GI endoscopy Indications:              Melena, Suspected upper gastrointestinal bleeding Providers:                Venita Lick. Russella Dar, MD, Priscella Mann, RN, Lorenda Ishihara, Technician Referring MD:             [facility] Medicines:                Fentanyl 25 micrograms IV, Midazolam 2 mg IV Complications:            No immediate complications. Estimated Blood Loss:     Estimated blood loss: none. Procedure:                Pre-Anesthesia Assessment:                           - Prior to the procedure, a History and Physical                            was performed, and patient medications and                            allergies were reviewed. The patient's tolerance of                            previous anesthesia was also reviewed. The risks                            and benefits of the procedure and the sedation                            options and risks were discussed with the patient.                            All questions were answered, and informed consent                            was obtained. Prior Anticoagulants: The patient has                            taken Coumadin (warfarin), last dose was 2 days                            prior to procedure. ASA Grade Assessment: III - A                            patient with severe systemic disease. After  reviewing the risks and benefits, the patient was                            deemed in satisfactory condition to undergo the                            procedure.                           After obtaining informed consent, the endoscope was                            passed under direct vision. Throughout the      procedure, the patient's blood pressure, pulse, and                            oxygen saturations were monitored continuously. The                            EG-[id number] ([id number]) scope was introduced through the                            mouth, and advanced to the third part of duodenum.                            The upper GI endoscopy was accomplished without                            difficulty. The patient tolerated the procedure                            well. Scope In: Scope Out: Findings:      The examined esophagus was normal.      Localized mildly erythematous mucosa without bleeding was found in the       gastric antrum.      A small hiatal hernia was present.      The exam of the stomach was otherwise normal.      A 15 mm non-bleeding diverticulum was found in the second portion of the       duodenum.      The duodenal bulb and third portion of the duodenum were normal. Impression:               - Normal esophagus.                           - Erythematous mucosa in the antrum.                           - Small hiatal hernia.                           - Non-bleeding duodenal diverticulum.                           - Normal duodenal bulb and third portion of the  duodenum.                           - No specimens collected. Moderate Sedation:      Moderate (conscious) sedation was administered by the endoscopy nurse       and supervised by the endoscopist. The following parameters were       monitored: oxygen saturation, heart rate, blood pressure, respiratory       rate, EKG, adequacy of pulmonary ventilation, and response to care.       Total physician intraservice time was 12 minutes. Recommendation:           - Return patient to hospital ward for ongoing care.                           - Clear liquid diet today.                           - Continue present medications.                           - Perform a colonoscopy tomorrow. Procedure  Code(s):        --- Professional ---                           [phone], Esophagogastroduodenoscopy, flexible,                            transoral; diagnostic, including collection of                            specimen(s) by brushing or washing, when performed                            (separate procedure)                           G0500, Moderate sedation services provided by the                            same physician or other qualified health care                            professional performing a gastrointestinal                            endoscopic service that sedation supports,                            requiring the presence of an independent trained                            observer to assist in the monitoring of the                            patient's level of consciousness and physiological  status; initial 15 minutes of intra-service time;                            patient age 42 years or older (additional time may                            be reported with [id number], as appropriate) Diagnosis Code(s):        --- Professional ---                           K31.89, Other diseases of stomach and duodenum                           K44.9, Diaphragmatic hernia without obstruction or                            gangrene                           K92.1, Melena (includes Hematochezia)                           K57.10, Diverticulosis of small intestine without                            perforation or abscess without bleeding CPT copyright 2016 [employer]. All rights reserved. The codes documented in this report are preliminary and upon coder review may  be revised to meet current compliance requirements. Ronald DareMalcolm T Stark, MD 05/17/2016 10:26:00 AM This report has been signed electronically. Number of Addenda: 0

## 2016-05-17 NOTE — Progress Notes (Signed)
Pt back from ENDO/EGD

## 2016-05-18 ENCOUNTER — Encounter (HOSPITAL_COMMUNITY)
Admission: EM | Disposition: A | Discharge status: Skilled Nursing Facility | Payer: Self-pay | Source: Home / Self Care | Attending: Family Medicine

## 2016-05-18 ENCOUNTER — Encounter (HOSPITAL_COMMUNITY): Payer: Self-pay | Admitting: Gastroenterology

## 2016-05-18 DIAGNOSIS — K7460 Unspecified cirrhosis of liver: Secondary | ICD-10-CM

## 2016-05-18 LAB — COMPREHENSIVE METABOLIC PANEL
ALT: 10 U/L — ABNORMAL LOW (ref 17–63)
ANION GAP: 7 (ref 5–15)
AST: 18 U/L (ref 15–41)
Albumin: 2.2 g/dL — ABNORMAL LOW (ref 3.5–5.0)
Alkaline Phosphatase: 93 U/L (ref 38–126)
BILIRUBIN TOTAL: 0.5 mg/dL (ref 0.3–1.2)
BUN: 28 mg/dL — ABNORMAL HIGH (ref 6–20)
CO2: 25 mmol/L (ref 22–32)
Calcium: 8.1 mg/dL — ABNORMAL LOW (ref 8.9–10.3)
Chloride: 103 mmol/L (ref 101–111)
Creatinine, Ser: 1.24 mg/dL (ref 0.61–1.24)
GFR, EST NON AFRICAN AMERICAN: 58 mL/min — AB (ref >60)
Glucose, Bld: 146 mg/dL — ABNORMAL HIGH (ref 65–99)
POTASSIUM: 4.5 mmol/L (ref 3.5–5.1)
Sodium: 135 mmol/L (ref 135–145)
TOTAL PROTEIN: 6.4 g/dL — AB (ref 6.5–8.1)

## 2016-05-18 LAB — CBC
HCT: 25.5 % — ABNORMAL LOW (ref 39.0–52.0)
Hemoglobin: 7.9 g/dL — ABNORMAL LOW (ref 13.0–17.0)
MCH: 26.9 pg (ref 26.0–34.0)
MCHC: 31 g/dL (ref 30.0–36.0)
MCV: 86.7 fL (ref 78.0–100.0)
PLATELETS: 217 10*3/uL (ref 150–400)
RBC: 2.94 MIL/uL — AB (ref 4.22–5.81)
RDW: 15.8 % — AB (ref 11.5–15.5)
WBC: 5.2 10*3/uL (ref 4.0–10.5)

## 2016-05-18 LAB — GLUCOSE, CAPILLARY
GLUCOSE-CAPILLARY: 181 mg/dL — AB (ref 65–99)
Glucose-Capillary: 110 mg/dL — ABNORMAL HIGH (ref 65–99)
Glucose-Capillary: 137 mg/dL — ABNORMAL HIGH (ref 65–99)
Glucose-Capillary: 166 mg/dL — ABNORMAL HIGH (ref 65–99)

## 2016-05-18 LAB — AMMONIA: Ammonia: 31 umol/L (ref 9–35)

## 2016-05-18 SURGERY — COLONOSCOPY
Anesthesia: Moderate Sedation

## 2016-05-18 MED ORDER — DEXTROSE 5 % IV SOLN
1.0000 g | INTRAVENOUS | Status: DC
  Administered 2016-05-18 – 2016-05-20 (×3): 1 g via INTRAVENOUS
  Filled 2016-05-18 (×4): qty 10

## 2016-05-18 MED ORDER — PANTOPRAZOLE SODIUM 40 MG IV SOLR
40.0000 mg | Freq: Every day | INTRAVENOUS | Status: DC
  Administered 2016-05-18 – 2016-05-19 (×2): 40 mg via INTRAVENOUS
  Filled 2016-05-18 (×2): qty 40

## 2016-05-18 MED ORDER — PEG-KCL-NACL-NASULF-NA ASC-C 100 G PO SOLR
0.5000 | Freq: Once | ORAL | Status: DC
  Filled 2016-05-18: qty 1

## 2016-05-18 MED ORDER — PEG-KCL-NACL-NASULF-NA ASC-C 100 G PO SOLR: 1.0000 | Freq: Once | ORAL | Status: DC

## 2016-05-18 MED ORDER — PEG-KCL-NACL-NASULF-NA ASC-C 100 G PO SOLR: 0.5000 | Freq: Once | ORAL | Status: DC

## 2016-05-18 NOTE — Progress Notes (Signed)
Patient refused to drink more than 1 cup of colonoscopy prep.  Patient refusing to sign consent for colonoscopy.  Endoscopy notified.  Dr. Russella DarStark will be notified by Endoscopy team.  Peri MarisAndrew Dayra Rapley, MBA, BSN, RN

## 2016-05-18 NOTE — Progress Notes (Signed)
PROGRESS NOTE    Ronald Simpson  BUL:[id number] DOB: 23-Sep-1945 DOA: 05/15/2016 PCP: Mayford Knife, MD   Brief Narrative:   71 y.o. male with medical history significant of paroxysmal atrial fibrillation on Coumadin and beta blocker. Presenting to the hospital after patient was found to have melanotic stools  Assessment & Plan:   GI bleed/Melanotic stools - GI consulted, continue protonix, work up pending decrease in INR - EGD completed and non revealing. Plan for colonoscopy next am (had problems taking prep)  Active Problems:   Hypothyroidism, acquired - stable continue synthroid    DVT prophylaxis: none given risk of bleed Code Status: Full Family Communication: None at bedside Disposition Plan: pending work up results   Consultants:   GI   Procedures: Pending   Antimicrobials: none   Subjective: Pt has no new complaints.   Objective: Vitals:   05/17/16 1718 05/17/16 2214 05/18/16 0702 05/18/16 0955  BP: 120/69 121/70 123/76 127/72  Pulse: 76 79 72 77  Resp: _0 Temp: 98.1 F (36.7 C) 98.2 F (36.8 C) 98.4 F (36.9 C) 97.6 F (36.4 C)  TempSrc: Oral Oral Oral Oral  SpO2: 100% 100% 100% 100%  Weight:  90.3 kg (199 lb)    Height:        Intake/Output Summary (Last 24 hours) at 05/18/16 1402 Last data filed at 05/18/16 0930  Gross per 24 hour  Intake             3120 ml  Output              300 ml  Net             2820 ml   Filed Weights   05/15/16 1900 05/16/16 2103 05/17/16 2214  Weight: 88 kg (194 lb 0.1 oz) 89.7 kg (197 lb 12.8 oz) 90.3 kg (199 lb)    Examination:  General exam: Appears calm and comfortable, in nad. Respiratory system: Clear to auscultation. Respiratory effort normal. Cardiovascular system: S1 & S2 heard, RRR. No JVD, murmurs, rubs, gallops or clicks. No pedal edema. Gastrointestinal system: Abdomen is nondistended, soft and nontender. Hypoactive bowel sounds Central nervous system: Alert and oriented. No  focal neurological deficits. Extremities: Symmetric 5 x 5 power. Skin: No rashes, lesions or ulcers, on limited exam. Psychiatry: Mood & affect appropriate.   Data Reviewed: I have personally reviewed following labs and imaging studies  CBC:  Recent Labs Lab 05/15/16 1440  05/16/16 2346 05/17/16 0150 05/17/16 0852 05/17/16 1528 05/17/16 2343  WBC 6.6  < > 5.4 5.1 5.4 5.2 5.2  NEUTROABS 4.0  --   --   --   --   --   --   HGB 8.2*  < > 8.2* 8.6* 8.6* 8.3* 7.9*  HCT 26.6*  < > 26.2* 27.0* 27.9* 26.4* 25.5*  MCV 86.9  < > 86.2 85.7 85.8 87.7 86.7  PLT 230  < > 202 194 201 203 217  < > = values in this interval not displayed. Basic Metabolic Panel:  Recent Labs Lab 05/15/16 1440 05/15/16 1521 05/16/16 0219 05/18/16 1111  NA 140 141 138 135  K 4.1 4.0 3.9 4.5  CL 107 104 105 103  CO2 27  --  26 25  GLUCOSE 110* 105* 214* 146*  BUN 35* 36* 35* 28*  CREATININE 1.08 1.10 1.12 1.24  CALCIUM 8.2*  --  7.9* 8.1*   GFR: Estimated Creatinine Clearance: 64.4 mL/min (by C-G formula based on SCr of  1.24 mg/dL). Liver Function Tests:  Recent Labs Lab 05/15/16 1440 05/18/16 1111  AST 16 18  ALT 9* 10*  ALKPHOS 109 93  BILITOT 0.6 0.5  PROT 6.5 6.4*  ALBUMIN 2.2* 2.2*   No results for input(s): LIPASE, AMYLASE in the last 168 hours.  Recent Labs Lab 05/18/16 1111  AMMONIA 31   Coagulation Profile:  Recent Labs Lab 05/15/16 1440 05/16/16 1004 05/17/16 0852  INR 2.46 2.82 1.56   Cardiac Enzymes: No results for input(s): CKTOTAL, CKMB, CKMBINDEX, TROPONINI in the last 168 hours. BNP (last 3 results) No results for input(s): PROBNP in the last 8760 hours. HbA1C: No results for input(s): HGBA1C in the last 72 hours. CBG:  Recent Labs Lab 05/17/16 1211 05/17/16 1721 05/17/16 2217 05/18/16 0752 05/18/16 1207  GLUCAP 129* 190* 139* 110* 137*   Lipid Profile: No results for input(s): CHOL, HDL, LDLCALC, TRIG, CHOLHDL, LDLDIRECT in the last 72  hours. Thyroid Function Tests: No results for input(s): TSH, T4TOTAL, FREET4, T3FREE, THYROIDAB in the last 72 hours. Anemia Panel: No results for input(s): VITAMINB12, FOLATE, FERRITIN, TIBC, IRON, RETICCTPCT in the last 72 hours. Sepsis Labs: No results for input(s): PROCALCITON, LATICACIDVEN in the last 168 hours.  Recent Results (from the past 240 hour(s))  MRSA PCR Screening     Status: Abnormal   Collection Time: 05/16/16  8:39 PM  Result Value Ref Range Status   MRSA by PCR POSITIVE (A) NEGATIVE Final    Comment:        The GeneXpert MRSA Assay (FDA approved for NASAL specimens only), is one component of a comprehensive MRSA colonization surveillance program. It is not intended to diagnose MRSA infection nor to guide or monitor treatment for MRSA infections. RESULT CALLED TO, READ BACK BY AND VERIFIED WITH: Angela Burke RN [id number] 05/16/16 A BROWNING       Radiology Studies: US Abdomen Limited Ruq  Result Date: 05/16/2016 CLINICAL DATA:  Cirrhosis, history of hepatitis-B and hepatitis-C, type II diabetes mellitus EXAM: US ABDOMEN LIMITED - RIGHT UPPER QUADRANT COMPARISON:  CT abdomen pelvis 12/27/2015 FINDINGS: Gallbladder: Partially contracted gallbladder. Gallbladder wall appears minimally thickened though this may be in part related to incomplete distention and in part due to ascites. No shadowing gallstones, pericholecystic fluid or sonographic Murphy sign. Common bile duct: Diameter: 4 mm diameter , normal Liver: Heterogeneous echogenicity. Slightly nodular hepatic margins consistent with cirrhosis. No discrete hepatic mass. No intrahepatic biliary dilatation. Significant ascites noted throughout the abdomen. IMPRESSION: Cirrhotic liver with significant ascites. Otherwise negative exam. Electronically Signed   By: Lavonia Dana M.D.   On: 05/16/2016 17:46    Scheduled Meds: . acetaminophen  650 mg Oral Q6H  . amitriptyline  25 mg Oral QHS  . carvedilol  6.25 mg Oral BID WC  .  cefTRIAXone (ROCEPHIN)  IV  1 g Intravenous Q24H  . Chlorhexidine Gluconate Cloth  6 each Topical Q0600  . DULoxetine  30 mg Oral Daily  . insulin aspart  0-9 Units Subcutaneous TID WC  . levothyroxine  25 mcg Oral QAC breakfast  . mupirocin ointment  1 application Nasal BID  . nicotine  14 mg Transdermal Daily  . pantoprazole  40 mg Intravenous Daily  . peg 3350 powder  0.5 kit Oral Once   And  . [START ON 05/19/2016] peg 3350 powder  0.5 kit Oral Once  . pregabalin  100 mg Oral Q8H   Continuous Infusions: . dextrose 5 % and 0.45 % NaCl with KCl 20  mEq/L 50 mL/hr at 05/18/16 1040     LOS: 2 days   Time spent: > 35 minutes  Velvet Bathe, MD [facility] Pager [phone]  If 7PM-7AM, please contact night-coverage [email or web address] Password [email or web address] 05/18/2016, 2:02 PM

## 2016-05-18 NOTE — Progress Notes (Signed)
[facility] Progress Note  Chief Complaint:   GI bleed  Subjective: No complaints. I woke him from sleep. He couldn't drink prep.  Objective:  Vital signs in last 24 hours: Temp:  [97.6 F (36.4 C)-98.4 F (36.9 C)] 97.6 F (36.4 C) (02/04 0955) Pulse Rate:  [72-79] 77 (02/04 0955) Resp:  [16-17] 17 (02/04 0955) BP: (120-127)/(69-76) 127/72 (02/04 0955) SpO2:  [100 %] 100 % (02/04 0955) Weight:  [199 lb (90.3 kg)] 199 lb (90.3 kg) (02/03 2214) Last BM Date: 05/16/16 General:   Black male in NAD Heart:  Regular rate and rhythm; no murmurs.Trace LLE edema.  Pulm: Normal respiratory effort Abdomen:  Soft, moderately distended, nontender.  Normal bowel sounds Neurologic:  Groggy. Psych:  Alert and cooperative. Normal mood and affect.   Intake/Output from previous day: 02/03 0701 - 02/04 0700 In: 3240 [P.O.:1440; I.V.:1800] Out: 250 [Urine:250] Intake/Output this shift: Total I/O In: 360 [P.O.:360] Out: 150 [Urine:150]  Lab Results:  Recent Labs  05/17/16 0852 05/17/16 1528 05/17/16 2343  WBC 5.4 5.2 5.2  HGB 8.6* 8.3* 7.9*  HCT 27.9* 26.4* 25.5*  PLT 201 203 217   BMET  Recent Labs  05/15/16 1440 05/15/16 1521 05/16/16 0219  NA 140 141 138  K 4.1 4.0 3.9  CL 107 104 105  CO2 27  --  26  GLUCOSE 110* 105* 214*  BUN 35* 36* 35*  CREATININE 1.08 1.10 1.12  CALCIUM 8.2*  --  7.9*   LFT  Recent Labs  05/15/16 1440  PROT 6.5  ALBUMIN 2.2*  AST 16  ALT 9*  ALKPHOS 109  BILITOT 0.6   PT/INR  Recent Labs  05/16/16 1004 05/17/16 0852  LABPROT 30.2* 18.9*  INR 2.82 1.56   Hepatitis Panel  Recent Labs  05/16/16 1625  HEPBSAG Negative    US Abdomen Limited Ruq  Result Date: 05/16/2016 CLINICAL DATA:  Cirrhosis, history of hepatitis-B and hepatitis-C, type II diabetes mellitus EXAM: US ABDOMEN LIMITED - RIGHT UPPER QUADRANT COMPARISON:  CT abdomen pelvis 12/27/2015 FINDINGS: Gallbladder: Partially contracted gallbladder.  Gallbladder wall appears minimally thickened though this may be in part related to incomplete distention and in part due to ascites. No shadowing gallstones, pericholecystic fluid or sonographic Murphy sign. Common bile duct: Diameter: 4 mm diameter , normal Liver: Heterogeneous echogenicity. Slightly nodular hepatic margins consistent with cirrhosis. No discrete hepatic mass. No intrahepatic biliary dilatation. Significant ascites noted throughout the abdomen. IMPRESSION: Cirrhotic liver with significant ascites. Otherwise negative exam. Electronically Signed   By: Ulyses Southward M.D.   On: 05/16/2016 17:46    Assessment / Plan:  1. Dark heme + stools / acute on chronic anemia. EGD yesterday was unrevealing. Plan was for a colonoscopy today but he got down only 1/4 of first part of prep and had no stools. Hard to get a history from but tells me he just couldn't drink it. Spoke with RN, patient wouldn't drink prep and wouldn't sign procedure consent form either.  -Talked with him today and he does not want to proceed with prep/colonoscopy so will not reschedule the procedure.  -hgb has drifted slightly down to 7.9 since getting 2 units of blood on 2/2. -Stop PPI gtt >>>> once daily dosing now  2. Decompensated cirrhosis, HCV with large volume ascites and possible hepatic encephalopathy. He is not co-infected with HBV and labs suggest immunity.  -needs paracentesis with fluid studies - planning for 2 liters -Starting Rocephin for SBP given gi bleed  and ascites -check ammonia level. -CMET   Active Problems:   Hypothyroidism, acquired   Acute GI bleeding   Melanotic stools    LOS: 2 days   Willette ClusterPaula Guenther NP 05/18/2016, 10:35 AM  Pager number [phone]     Attending physician's note   I have taken an interval history, reviewed the chart and examined the patient. I agree with the Advanced Practitioner's note, impression and recommendations. Pt wouldn't drink prep or sign consent yesterday so  colonoscopy for today was cancelled. Discussed further today-he declines prep and colonoscopy so no plans to reschedule. US shows a cirrhotic liver and ascites. Cirrhosis likely due to HCV. Further evaluation with paracentesis today. Rocephin started. Primary service to evaluate risks/benefits to resuming Coumadin.   Claudette HeadMalcolm Joshoa Shawler, MD Clementeen GrahamFACG [phone] Mon-Fri 8a-5p [phone] after 5p, weekends, holidays

## 2016-05-18 NOTE — Progress Notes (Signed)
Patient continues to refuse prep for colonoscopy. Refused to sign consent as well.

## 2016-05-18 NOTE — Progress Notes (Signed)
Patient refused to have colonoscopy and sign consent.  Paracentesis will not be able to be performed until 05-19-2016.  Notified GI Wilmon Pali(Guenther, NP).  Order received for Heart Healthy Diet.  Peri MarisAndrew Vartan Kerins, MBA, BSN, RN

## 2016-05-19 ENCOUNTER — Encounter (HOSPITAL_COMMUNITY): Payer: Self-pay | Admitting: Physician Assistant

## 2016-05-19 ENCOUNTER — Inpatient Hospital Stay (HOSPITAL_COMMUNITY): Payer: Medicare Other

## 2016-05-19 LAB — GRAM STAIN

## 2016-05-19 LAB — BODY FLUID CELL COUNT WITH DIFFERENTIAL
Eos, Fluid: 0 %
LYMPHS FL: 39 %
Monocyte-Macrophage-Serous Fluid: 34 % — ABNORMAL LOW (ref 50–90)
NEUTROPHIL FLUID: 27 % — AB (ref 0–25)
Other Cells, Fluid: 0 %
WBC FLUID: 320 uL (ref 0–1000)

## 2016-05-19 LAB — GLUCOSE, CAPILLARY
GLUCOSE-CAPILLARY: 203 mg/dL — AB (ref 65–99)
GLUCOSE-CAPILLARY: 113 mg/dL — AB (ref 65–99)
GLUCOSE-CAPILLARY: 122 mg/dL — AB (ref 65–99)

## 2016-05-19 LAB — PROTEIN, BODY FLUID: Total protein, fluid: 3.9 g/dL

## 2016-05-19 LAB — ALBUMIN, FLUID (OTHER): Albumin, Fluid: 1.6 g/dL

## 2016-05-19 MED ORDER — LIDOCAINE HCL 1 % IJ SOLN
INTRAMUSCULAR | Status: AC
  Administered 2016-05-19: 17:00:00
  Filled 2016-05-19: qty 20

## 2016-05-19 MED ORDER — PANTOPRAZOLE SODIUM 40 MG PO TBEC
40.0000 mg | DELAYED_RELEASE_TABLET | Freq: Every day | ORAL | Status: DC
  Administered 2016-05-20 – 2016-05-21 (×2): 40 mg via ORAL
  Filled 2016-05-19 (×2): qty 1

## 2016-05-19 NOTE — Progress Notes (Signed)
PA aware of order for paracentesis.  Patient previously consenting for self.  Was brought to US department for procedure, however unable to awaken long enough for consent.  Called emergency contact listed in chart but no answer. Patient was sent back to unit.  Will re-attempt if family becomes available or patient becomes more alert.  Senaida OresKacie Sue-Ellen Tonika Eden 05/19/16 11:18 AM

## 2016-05-19 NOTE — Progress Notes (Signed)
[facility] Progress Note    Since last GI note: He was sleeping, a bit hard to awaken but eventually he woke up.  Still declines colonoscopy.  Objective: Vital signs in last 24 hours: Temp:  [97.6 F (36.4 C)-98.1 F (36.7 C)] 98.1 F (36.7 C) (02/05 0700) Pulse Rate:  [77-83] 77 (02/05 0700) Resp:  [16-17] 17 (02/05 0700) BP: (122-131)/(72-82) 130/77 (02/05 0700) SpO2:  [100 %] 100 % (02/05 0700) Weight:  [203 lb 9.6 oz (92.4 kg)] 203 lb 9.6 oz (92.4 kg) (02/04 2144) Last BM Date: 05/16/16 General: alert and oriented times 3 Heart: regular rate and rythm Abdomen: soft, non-tender, non-distended, normal bowel sounds   Lab Results:  Recent Labs  05/17/16 0852 05/17/16 1528 05/17/16 2343  WBC 5.4 5.2 5.2  HGB 8.6* 8.3* 7.9*  PLT 201 203 217  MCV 85.8 87.7 86.7    Recent Labs  05/18/16 1111  NA 135  K 4.5  CL 103  CO2 25  GLUCOSE 146*  BUN 28*  CREATININE 1.24  CALCIUM 8.1*    Recent Labs  05/18/16 1111  PROT 6.4*  ALBUMIN 2.2*  AST 18  ALT 10*  ALKPHOS 93  BILITOT 0.5    Recent Labs  05/16/16 1004 05/17/16 0852  INR 2.82 1.56    Medications: Scheduled Meds: . acetaminophen  650 mg Oral Q6H  . amitriptyline  25 mg Oral QHS  . carvedilol  6.25 mg Oral BID WC  . cefTRIAXone (ROCEPHIN)  IV  1 g Intravenous Q24H  . Chlorhexidine Gluconate Cloth  6 each Topical Q0600  . DULoxetine  30 mg Oral Daily  . insulin aspart  0-9 Units Subcutaneous TID WC  . levothyroxine  25 mcg Oral QAC breakfast  . mupirocin ointment  1 application Nasal BID  . nicotine  14 mg Transdermal Daily  . pantoprazole  40 mg Intravenous Daily  . peg 3350 powder  0.5 kit Oral Once   And  . peg 3350 powder  0.5 kit Oral Once  . pregabalin  100 mg Oral Q8H   Continuous Infusions: . dextrose 5 % and 0.45 % NaCl with KCl 20 mEq/L 50 mL/hr at 05/19/16 0841   PRN Meds:.ipratropium-albuterol, nitroGLYCERIN, traMADol    Assessment/Plan: 71 y.o. male with  cirrhosis (Hep C), decompensated; dark Heme + stools.  EGD essentially unrevealing 2 days ago and he has declined further testing.  He still declines colonoscopy. He is not have active bleeding that I can tell.  Needs CBC, repeat INR today.  Awaiting paracentesis, already ordered.      Milus Banister, MD  05/19/2016, 9:29 AM [facility] Pager [phone]

## 2016-05-19 NOTE — Procedures (Signed)
PROCEDURE SUMMARY:  Successful US guided paracentesis from left lateral abdomen.  Yielded 1.8L of clear, yellow fluid.  No immediate complications.  Pt tolerated well.   Specimen was sent for labs.  Hoyt KochKacie Sue-Ellen Houa Ackert PA-C 05/19/2016 4:32 PM

## 2016-05-19 NOTE — Progress Notes (Signed)
PROGRESS NOTE    Ronald Simpson  ZOX:[id number] DOB: August 01, 1945 DOA: 05/15/2016 PCP: Tilden DomeSCHNEIDER,RICHARD, MD   Brief Narrative:   71 y.o. male with medical history significant of paroxysmal atrial fibrillation on Coumadin and beta blocker. Presenting to the hospital after patient was found to have melanotic stools  EGD non revealing. Pt declining colonoscopty  Assessment & Plan:   GI bleed/Melanotic stools - GI consulted, continue protonix, work up pending decrease in INR - EGD completed and non revealing. Pt denying colonoscopy - will assess cbc this am. Order placed. No active bleeding reported.  Active Problems:   Hypothyroidism, acquired - stable continue synthroid  Anemia - secondary to GI bleed - reassess cbc if < 8 with active bleeding will transfuse    DVT prophylaxis: none given risk of bleed Code Status: Full Family Communication: None at bedside Disposition Plan: awaiting lab results pt may require transfusion   Consultants:   GI   Procedures: Pending   Antimicrobials: none   Subjective: Pt has no new complaints.   Objective: Vitals:   05/18/16 0955 05/18/16 1700 05/18/16 2144 05/19/16 0700  BP: 127/72 131/82 122/77 130/77  Pulse: 77 83 77 77  Resp: 17 16 16 17   Temp: 97.6 F (36.4 C) 97.7 F (36.5 C) 98.1 F (36.7 C) 98.1 F (36.7 C)  TempSrc: Oral Oral Oral Oral  SpO2: 100% 100% 100% 100%  Weight:   92.4 kg (203 lb 9.6 oz)   Height:        Intake/Output Summary (Last 24 hours) at 05/19/16 0959 Last data filed at 05/19/16 0834  Gross per 24 hour  Intake              460 ml  Output              350 ml  Net              110 ml   Filed Weights   05/16/16 2103 05/17/16 2214 05/18/16 2144  Weight: 89.7 kg (197 lb 12.8 oz) 90.3 kg (199 lb) 92.4 kg (203 lb 9.6 oz)    Examination:  General exam: Appears calm and comfortable, resting and arrousable, in nad. Respiratory system: Clear to auscultation. Respiratory effort  normal. Cardiovascular system: S1 & S2 heard, RRR. No JVD, murmurs, rubs, gallops or clicks. No pedal edema. Gastrointestinal system: Abdomen is nondistended, soft and nontender. Hypoactive bowel sounds Central nervous system: Alert and oriented. No focal neurological deficits. Extremities: Symmetric 5 x 5 power. Skin: No rashes, lesions or ulcers, on limited exam. Psychiatry: Mood & affect appropriate.   Data Reviewed: I have personally reviewed following labs and imaging studies  CBC:  Recent Labs Lab 05/15/16 1440  05/16/16 2346 05/17/16 0150 05/17/16 0852 05/17/16 1528 05/17/16 2343  WBC 6.6  < > 5.4 5.1 5.4 5.2 5.2  NEUTROABS 4.0  --   --   --   --   --   --   HGB 8.2*  < > 8.2* 8.6* 8.6* 8.3* 7.9*  HCT 26.6*  < > 26.2* 27.0* 27.9* 26.4* 25.5*  MCV 86.9  < > 86.2 85.7 85.8 87.7 86.7  PLT 230  < > 202 194 201 203 217  < > = values in this interval not displayed. Basic Metabolic Panel:  Recent Labs Lab 05/15/16 1440 05/15/16 1521 05/16/16 0219 05/18/16 1111  NA 140 141 138 135  K 4.1 4.0 3.9 4.5  CL 107 104 105 103  CO2 27  --  26 25  GLUCOSE 110* 105* 214* 146*  BUN 35* 36* 35* 28*  CREATININE 1.08 1.10 1.12 1.24  CALCIUM 8.2*  --  7.9* 8.1*   GFR: Estimated Creatinine Clearance: 65.2 mL/min (by C-G formula based on SCr of 1.24 mg/dL). Liver Function Tests:  Recent Labs Lab 05/15/16 1440 05/18/16 1111  AST 16 18  ALT 9* 10*  ALKPHOS 109 93  BILITOT 0.6 0.5  PROT 6.5 6.4*  ALBUMIN 2.2* 2.2*   No results for input(s): LIPASE, AMYLASE in the last 168 hours.  Recent Labs Lab 05/18/16 1111  AMMONIA 31   Coagulation Profile:  Recent Labs Lab 05/15/16 1440 05/16/16 1004 05/17/16 0852  INR 2.46 2.82 1.56   Cardiac Enzymes: No results for input(s): CKTOTAL, CKMB, CKMBINDEX, TROPONINI in the last 168 hours. BNP (last 3 results) No results for input(s): PROBNP in the last 8760 hours. HbA1C: No results for input(s): HGBA1C in the last 72  hours. CBG:  Recent Labs Lab 05/18/16 0752 05/18/16 1207 05/18/16 1658 05/18/16 2146 05/19/16 0810  GLUCAP 110* 137* 166* 181* 203*   Lipid Profile: No results for input(s): CHOL, HDL, LDLCALC, TRIG, CHOLHDL, LDLDIRECT in the last 72 hours. Thyroid Function Tests: No results for input(s): TSH, T4TOTAL, FREET4, T3FREE, THYROIDAB in the last 72 hours. Anemia Panel: No results for input(s): VITAMINB12, FOLATE, FERRITIN, TIBC, IRON, RETICCTPCT in the last 72 hours. Sepsis Labs: No results for input(s): PROCALCITON, LATICACIDVEN in the last 168 hours.  Recent Results (from the past 240 hour(s))  MRSA PCR Screening     Status: Abnormal   Collection Time: 05/16/16  8:39 PM  Result Value Ref Range Status   MRSA by PCR POSITIVE (A) NEGATIVE Final    Comment:        The GeneXpert MRSA Assay (FDA approved for NASAL specimens only), is one component of a comprehensive MRSA colonization surveillance program. It is not intended to diagnose MRSA infection nor to guide or monitor treatment for MRSA infections. RESULT CALLED TO, READ BACK BY AND VERIFIED WITH: Barbaraann Cao RN 1610 05/16/16 A BROWNING       Radiology Studies: No results found.  Scheduled Meds: . acetaminophen  650 mg Oral Q6H  . amitriptyline  25 mg Oral QHS  . carvedilol  6.25 mg Oral BID WC  . cefTRIAXone (ROCEPHIN)  IV  1 g Intravenous Q24H  . Chlorhexidine Gluconate Cloth  6 each Topical Q0600  . DULoxetine  30 mg Oral Daily  . insulin aspart  0-9 Units Subcutaneous TID WC  . levothyroxine  25 mcg Oral QAC breakfast  . mupirocin ointment  1 application Nasal BID  . nicotine  14 mg Transdermal Daily  . [START ON 05/20/2016] pantoprazole  40 mg Oral Daily  . pregabalin  100 mg Oral Q8H   Continuous Infusions: . dextrose 5 % and 0.45 % NaCl with KCl 20 mEq/L 50 mL/hr at 05/19/16 0841     LOS: 3 days   Time spent: > 35 minutes  Penny Pia, MD [facility] Pager [phone]  If 7PM-7AM, please  contact night-coverage [email or web address] Password [email or web address] 05/19/2016, 9:59 AM

## 2016-05-19 NOTE — Progress Notes (Signed)
Daily Rounding Note  05/19/2016, 9:10 AM  LOS: 3 days   SUBJECTIVE:   Chief complaint: none.  Last BM on 2/2.    OBJECTIVE:         Vital signs in last 24 hours:    Temp:  [97.6 F (36.4 C)-98.1 F (36.7 C)] 98.1 F (36.7 C) (02/05 0700) Pulse Rate:  [77-83] 77 (02/05 0700) Resp:  [16-17] 17 (02/05 0700) BP: (122-131)/(72-82) 130/77 (02/05 0700) SpO2:  [100 %] 100 % (02/05 0700) Weight:  [92.4 kg (203 lb 9.6 oz)] 92.4 kg (203 lb 9.6 oz) (02/04 2144) Last BM Date: 05/16/16 Filed Weights   05/16/16 2103 05/17/16 2214 05/18/16 2144  Weight: 89.7 kg (197 lb 12.8 oz) 90.3 kg (199 lb) 92.4 kg (203 lb 9.6 oz)   General: somnolent, difficult to arouse   Heart: RRR Chest: clear in front, no cough or labored resps Abdomen: obese, active BS.  NT.    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 Asterixis, looks to have flap.   Intake/Output from previous day: 02/04 0701 - 02/05 0700 In: 820 [P.O.:820] Out: 500 [Urine:500]  Intake/Output this shift: No intake/output data recorded.  Lab Results:  Recent Labs  05/17/16 0852 05/17/16 1528 05/17/16 2343  WBC 5.4 5.2 5.2  HGB 8.6* 8.3* 7.9*  HCT 27.9* 26.4* 25.5*  PLT 201 203 217   BMET  Recent Labs  05/18/16 1111  NA 135  K 4.5  CL 103  CO2 25  GLUCOSE 146*  BUN 28*  CREATININE 1.24  CALCIUM 8.1*   LFT  Recent Labs  05/18/16 1111  PROT 6.4*  ALBUMIN 2.2*  AST 18  ALT 10*  ALKPHOS 93  BILITOT 0.5   PT/INR  Recent Labs  05/16/16 1004 05/17/16 0852  LABPROT 30.2* 18.9*  INR 2.82 1.56   Hepatitis Panel  Recent Labs  05/16/16 1625  HEPBSAG Negative    Studies/Results: No results found.  Scheduled Meds: . acetaminophen  650 mg Oral Q6H  . amitriptyline  25 mg Oral QHS  . carvedilol  6.25 mg Oral BID WC  . cefTRIAXone (ROCEPHIN)  IV  1 g Intravenous Q24H    . Chlorhexidine Gluconate Cloth  6 each Topical Q0600  . DULoxetine  30 mg Oral Daily  . insulin aspart  0-9 Units Subcutaneous TID WC  . levothyroxine  25 mcg Oral QAC breakfast  . mupirocin ointment  1 application Nasal BID  . nicotine  14 mg Transdermal Daily  . pantoprazole  40 mg Intravenous Daily  . peg 3350 powder  0.5 kit Oral Once   And  . peg 3350 powder  0.5 kit Oral Once  . pregabalin  100 mg Oral Q8H   Continuous Infusions: . dextrose 5 % and 0.45 % NaCl with KCl 20 mEq/L 50 mL/hr at 05/19/16 0841   PRN Meds:.ipratropium-albuterol, nitroGLYCERIN, traMADol  ASSESMENT:   *  GIB with melena.  05/17/16 EGD: antral erythema, non-bleeding duodenal tic, small HH. No sequela of liver disease.  Planned 05/18/16 colonoscopy but pt not able to drink prep and declined future colonoscopy.    *  Acute on chronic anemia.  Normocytic.    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  Hep B immune.    *  Large volume ascites.    *  Chronic Coumadin for PAF, on  hold.   *   Hx CVA.  Less alert today.  Ammonia level just 31 (normal). No recent benzos or opiates.   *  IDDM.     PLAN   *  Paracentesis today.    *  Ok to resume Coumadin?Marland Kitchen      *  Allow carb mod diet.  Switch to oral PPI.   *  How many days of Rocephin (for SBP prophylaxis)?    Azucena Freed  05/19/2016, 9:10 AM Pager: [phone]

## 2016-05-20 ENCOUNTER — Encounter: Payer: Self-pay | Admitting: Vascular Surgery

## 2016-05-20 LAB — CBC
HEMATOCRIT: 27.3 % — AB (ref 39.0–52.0)
HEMOGLOBIN: 8.3 g/dL — AB (ref 13.0–17.0)
MCH: 26.9 pg (ref 26.0–34.0)
MCHC: 30.4 g/dL (ref 30.0–36.0)
MCV: 88.3 fL (ref 78.0–100.0)
Platelets: 221 10*3/uL (ref 150–400)
RBC: 3.09 MIL/uL — AB (ref 4.22–5.81)
RDW: 16.2 % — ABNORMAL HIGH (ref 11.5–15.5)
WBC: 4.9 10*3/uL (ref 4.0–10.5)

## 2016-05-20 LAB — GLUCOSE, CAPILLARY
GLUCOSE-CAPILLARY: 117 mg/dL — AB (ref 65–99)
Glucose-Capillary: 133 mg/dL — ABNORMAL HIGH (ref 65–99)
Glucose-Capillary: 152 mg/dL — ABNORMAL HIGH (ref 65–99)
Glucose-Capillary: 150 mg/dL — ABNORMAL HIGH (ref 65–99)

## 2016-05-20 MED ORDER — FERROUS SULFATE 325 (65 FE) MG PO TABS
325.0000 mg | ORAL_TABLET | Freq: Three times a day (TID) | ORAL | Status: DC
  Administered 2016-05-20 – 2016-05-21 (×4): 325 mg via ORAL
  Filled 2016-05-20 (×4): qty 1

## 2016-05-20 NOTE — Progress Notes (Signed)
[facility] Progress Note    Since last GI note: Laying back in bed, preparing to eat breakfast.   Reports no BM in 2-3 days.  No abd pains, nausea or vomiting.  Diagnostic paracentesis yesterday 1.8Liters: Cell count shows no SBP, total protein 3.9, SAAG 0.6; These tests suggest that the ascites is NOT due to cirrhosis, point more towards renal, cardiac causes.  Objective: Vital signs in last 24 hours: Temp:  [97.7 F (36.5 C)-97.9 F (36.6 C)] 97.7 F (36.5 C) (02/06 0645) Pulse Rate:  [74-77] 74 (02/06 0645) Resp:  [16] 16 (02/06 0645) BP: (135-150)/(75-86) 150/76 (02/06 0645) SpO2:  [100 %] 100 % (02/06 0645) Weight:  [200 lb 1.6 oz (90.8 kg)] 200 lb 1.6 oz (90.8 kg) (02/05 2252) Last BM Date: 05/19/16 General: alert and oriented times 2-3 Heart: regular rate and rythm Abdomen: soft, non-tender, non-distended, normal bowel sounds   Lab Results:  Recent Labs  05/17/16 0852 05/17/16 1528 05/17/16 2343  WBC 5.4 5.2 5.2  HGB 8.6* 8.3* 7.9*  PLT 201 203 217  MCV 85.8 87.7 86.7    Recent Labs  05/18/16 1111  NA 135  K 4.5  CL 103  CO2 25  GLUCOSE 146*  BUN 28*  CREATININE 1.24  CALCIUM 8.1*    Recent Labs  05/18/16 1111  PROT 6.4*  ALBUMIN 2.2*  AST 18  ALT 10*  ALKPHOS 93  BILITOT 0.5    Recent Labs  05/17/16 0852  INR 1.56     Medications: Scheduled Meds: . acetaminophen  650 mg Oral Q6H  . amitriptyline  25 mg Oral QHS  . carvedilol  6.25 mg Oral BID WC  . cefTRIAXone (ROCEPHIN)  IV  1 g Intravenous Q24H  . Chlorhexidine Gluconate Cloth  6 each Topical Q0600  . DULoxetine  30 mg Oral Daily  . insulin aspart  0-9 Units Subcutaneous TID WC  . levothyroxine  25 mcg Oral QAC breakfast  . mupirocin ointment  1 application Nasal BID  . nicotine  14 mg Transdermal Daily  . pantoprazole  40 mg Oral Daily  . pregabalin  100 mg Oral Q8H   Continuous Infusions: . dextrose 5 % and 0.45 % NaCl with KCl 20 mEq/L 50 mL/hr at  05/20/16 0529   PRN Meds:.ipratropium-albuterol, nitroGLYCERIN, traMADol    Assessment/Plan: 71 y.o. male with recent dark stools, EGD no clear source, ascites  Low (<1.1) serum to ascites albumin gradient argues against portal hypertension, cirrhosis as a cause of his ascites. His liver does appear a bit nodular on recent US but I'm not sure that he truly has cirrhosis (his platelets are normal, SAAG is low, total Bilirubin is normal).    Should consider other possible contributers to fluid in abdomen such as cardiac/renal.   He is not having any signs of overt GI bleeding; he is declining blood draws, declining colonoscopy, asking to go back to SNF.  Not sure that I have much more to offer him. Please call or page with any further questions or concerns.     Rachael FeeJacobs, Magalene Mclear P, MD  05/20/2016, 8:09 AM [facility] Pager [phone]

## 2016-05-20 NOTE — Progress Notes (Signed)
PROGRESS NOTE    Ronald Simpson  MVH:[id number] DOB: [birth year] DOA: 05/15/2016 PCP: [name], MD   Brief Narrative:   71 y.o. male with medical history significant of paroxysmal atrial fibrillation on Coumadin and beta blocker. Presenting to the hospital after patient was found to have melanotic stools  EGD non revealing. Pt declining colonoscopty and was declining lab draws this morning. I spoke to patient who was agreeable to get another lab draw.   Assessment & Plan:   GI bleed/Melanotic stools - GI consulted, continue protonix, work up pending decrease in INR - EGD completed and non revealing. Pt denying colonoscopy - No active bleeding reported. If hgb stable next am would consider d/c back to SNF given that patient wants no further work up. - place on ferrous sulfate.  Active Problems:   Hypothyroidism, acquired - stable continue synthroid  Anemia - secondary to GI bleed - reassess cbc if < 8 with active bleeding will transfuse    DVT prophylaxis: none given risk of bleed Code Status: Full Family Communication: None at bedside Disposition Plan: awaiting lab results pt may require transfusion   Consultants:   GI   Procedures: Pending   Antimicrobials: none   Subjective: Pt has no new complaints. No acute issues overnight.  Objective: Vitals:   05/19/16 1636 05/19/16 2252 05/20/16 0645 05/20/16 0833  BP: 137/82 (!) 142/75 (!) 150/76 127/82  Pulse:  77 74 77  Resp:  16 16 18   Temp:  97.9 F (36.6 C) 97.7 F (36.5 C) 98.2 F (36.8 C)  TempSrc:  Oral Axillary Oral  SpO2:  100% 100% 100%  Weight:  90.8 kg (200 lb 1.6 oz)    Height:        Intake/Output Summary (Last 24 hours) at 05/20/16 1550 Last data filed at 05/20/16 0900  Gross per 24 hour  Intake             1280 ml  Output              370 ml  Net              910 ml   Filed Weights   05/17/16 2214 05/18/16 2144 05/19/16 2252  Weight: 90.3 kg (199 lb) 92.4 kg (203 lb 9.6 oz)  90.8 kg (200 lb 1.6 oz)    Examination:  General exam: Appears calm and comfortable, resting and arrousable, in nad. Respiratory system: Clear to auscultation. Respiratory effort normal. Cardiovascular system: S1 & S2 heard, RRR. No JVD, murmurs, rubs, gallops or clicks. No pedal edema. Gastrointestinal system: Abdomen is nondistended, soft and nontender. Hypoactive bowel sounds Central nervous system: Alert and oriented. No focal neurological deficits. Extremities: Symmetric 5 x 5 power. Skin: No rashes, lesions or ulcers, on limited exam. Psychiatry: Mood & affect appropriate.   Data Reviewed: I have personally reviewed following labs and imaging studies  CBC:  Recent Labs Lab 05/15/16 1440  05/17/16 0150 05/17/16 0852 05/17/16 1528 05/17/16 2343 05/20/16 1032  WBC 6.6  < > 5.1 5.4 5.2 5.2 4.9  NEUTROABS 4.0  --   --   --   --   --   --   HGB 8.2*  < > 8.6* 8.6* 8.3* 7.9* 8.3*  HCT 26.6*  < > 27.0* 27.9* 26.4* 25.5* 27.3*  MCV 86.9  < > 85.7 85.8 87.7 86.7 88.3  PLT 230  < > 194 201 203 217 221  < > = values in this interval not displayed. Basic Metabolic Panel:  Recent Labs Lab 05/15/16 1440 05/15/16 1521 05/16/16 0219 05/18/16 1111  NA 140 141 138 135  K 4.1 4.0 3.9 4.5  CL 107 104 105 103  CO2 27  --  26 25  GLUCOSE 110* 105* 214* 146*  BUN 35* 36* 35* 28*  CREATININE 1.08 1.10 1.12 1.24  CALCIUM 8.2*  --  7.9* 8.1*   GFR: Estimated Creatinine Clearance: 64.6 mL/min (by C-G formula based on SCr of 1.24 mg/dL). Liver Function Tests:  Recent Labs Lab 05/15/16 1440 05/18/16 1111  AST 16 18  ALT 9* 10*  ALKPHOS 109 93  BILITOT 0.6 0.5  PROT 6.5 6.4*  ALBUMIN 2.2* 2.2*   No results for input(s): LIPASE, AMYLASE in the last 168 hours.  Recent Labs Lab 05/18/16 1111  AMMONIA 31   Coagulation Profile:  Recent Labs Lab 05/15/16 1440 05/16/16 1004 05/17/16 0852  INR 2.46 2.82 1.56   Cardiac Enzymes: No results for input(s): CKTOTAL, CKMB,  CKMBINDEX, TROPONINI in the last 168 hours. BNP (last 3 results) No results for input(s): PROBNP in the last 8760 hours. HbA1C: No results for input(s): HGBA1C in the last 72 hours. CBG:  Recent Labs Lab 05/19/16 1202 05/19/16 1710 05/20/16 0023 05/20/16 0729 05/20/16 1129  GLUCAP 113* 122* 117* 133* 152*   Lipid Profile: No results for input(s): CHOL, HDL, LDLCALC, TRIG, CHOLHDL, LDLDIRECT in the last 72 hours. Thyroid Function Tests: No results for input(s): TSH, T4TOTAL, FREET4, T3FREE, THYROIDAB in the last 72 hours. Anemia Panel: No results for input(s): VITAMINB12, FOLATE, FERRITIN, TIBC, IRON, RETICCTPCT in the last 72 hours. Sepsis Labs: No results for input(s): PROCALCITON, LATICACIDVEN in the last 168 hours.  Recent Results (from the past 240 hour(s))  MRSA PCR Screening     Status: Abnormal   Collection Time: 05/16/16  8:39 PM  Result Value Ref Range Status   MRSA by PCR POSITIVE (A) NEGATIVE Final    Comment:        The GeneXpert MRSA Assay (FDA approved for NASAL specimens only), is one component of a comprehensive MRSA colonization surveillance program. It is not intended to diagnose MRSA infection nor to guide or monitor treatment for MRSA infections. RESULT CALLED TO, READ BACK BY AND VERIFIED WITH: E CASTRO RN 2247 05/16/16 A BROWNING   Gram stain     Status: None   Collection Time: 05/19/16  4:49 PM  Result Value Ref Range Status   Specimen Description FLUID  Final   Special Requests PERITONEAL  Final   Gram Stain   Final    WBC PRESENT, PREDOMINANTLY PMN CYTOSPIN SMEAR NO ORGANISMS SEEN    Report Status 05/19/2016 FINAL  Final  Culture, body fluid-bottle     Status: None (Preliminary result)   Collection Time: 05/19/16  4:49 PM  Result Value Ref Range Status   Specimen Description FLUID PERITONEAL  Final   Special Requests NONE  Final   Culture NO GROWTH < 24 HOURS  Final   Report Status PENDING  Incomplete      Radiology Studies: US  Paracentesis  Result Date: 05/20/2016 INDICATION: Patient with hepatic encephalopathy and decompensated cirrhosis. Request made for diagnostic and therapeutic paracentesis EXAM: ULTRASOUND GUIDED DIAGNOSTIC AND THERAPEUTIC PARACENTESIS MEDICATIONS: 10 mL 1% lidocaine COMPLICATIONS: None immediate. PROCEDURE: Informed written consent was obtained from the patient after a discussion of the risks, benefits and alternatives to treatment. A timeout was performed prior to the initiation of the procedure. Initial ultrasound scanning demonstrates a moderate amount of ascites  within the left lateral abdomen. The left lateral abdomen was prepped and draped in the usual sterile fashion. 1% lidocaine was used for local anesthesia. Following this, a 19 gauge, 7-cm, Yueh catheter was introduced. An ultrasound image was saved for documentation purposes. The paracentesis was performed. The catheter was removed and a dressing was applied. The patient tolerated the procedure well without immediate post procedural complication. FINDINGS: A total of approximately 1.8 liters of clear, yellow fluid was removed. Samples were sent to the laboratory as requested by the clinical team. IMPRESSION: Successful ultrasound-guided paracentesis yielding 1.8 liters of peritoneal fluid. Read by:  Loyce DysKacie Matthews PA-C Electronically Signed   By: Gilmer MorJaime  Wagner D.O.   On: 05/19/2016 16:35    Scheduled Meds: . acetaminophen  650 mg Oral Q6H  . amitriptyline  25 mg Oral QHS  . carvedilol  6.25 mg Oral BID WC  . cefTRIAXone (ROCEPHIN)  IV  1 g Intravenous Q24H  . Chlorhexidine Gluconate Cloth  6 each Topical Q0600  . DULoxetine  30 mg Oral Daily  . insulin aspart  0-9 Units Subcutaneous TID WC  . levothyroxine  25 mcg Oral QAC breakfast  . mupirocin ointment  1 application Nasal BID  . nicotine  14 mg Transdermal Daily  . pantoprazole  40 mg Oral Daily  . pregabalin  100 mg Oral Q8H   Continuous Infusions: . dextrose 5 % and 0.45 % NaCl  with KCl 20 mEq/L 50 mL/hr at 05/20/16 0529     LOS: 4 days   Time spent: > 35 minutes  Penny PiaVEGA, Dailey Alberson, MD [facility] Pager [phone]  If 7PM-7AM, please contact night-coverage [email or web address] Password [email or web address] 05/20/2016, 3:50 PM

## 2016-05-21 DIAGNOSIS — K7460 Unspecified cirrhosis of liver: Secondary | ICD-10-CM | POA: Diagnosis present

## 2016-05-21 DIAGNOSIS — E039 Hypothyroidism, unspecified: Secondary | ICD-10-CM

## 2016-05-21 DIAGNOSIS — Z7901 Long term (current) use of anticoagulants: Secondary | ICD-10-CM

## 2016-05-21 DIAGNOSIS — I480 Paroxysmal atrial fibrillation: Secondary | ICD-10-CM

## 2016-05-21 LAB — GLUCOSE, CAPILLARY
GLUCOSE-CAPILLARY: 171 mg/dL — AB (ref 65–99)
GLUCOSE-CAPILLARY: 156 mg/dL — AB (ref 65–99)
GLUCOSE-CAPILLARY: 169 mg/dL — AB (ref 65–99)
Glucose-Capillary: 140 mg/dL — ABNORMAL HIGH (ref 65–99)

## 2016-05-21 LAB — CBC
HCT: 26.6 % — ABNORMAL LOW (ref 39.0–52.0)
Hemoglobin: 8.2 g/dL — ABNORMAL LOW (ref 13.0–17.0)
MCH: 27.1 pg (ref 26.0–34.0)
MCHC: 30.8 g/dL (ref 30.0–36.0)
MCV: 87.8 fL (ref 78.0–100.0)
PLATELETS: 229 10*3/uL (ref 150–400)
RBC: 3.03 MIL/uL — AB (ref 4.22–5.81)
RDW: 16.2 % — AB (ref 11.5–15.5)
WBC: 6 10*3/uL (ref 4.0–10.5)

## 2016-05-21 MED ORDER — WARFARIN SODIUM 4 MG PO TABS: 4.0000 mg | ORAL_TABLET | Freq: Every day | ORAL | Status: DC

## 2016-05-21 MED ORDER — TRAMADOL HCL 50 MG PO TABS: 50.0000 mg | ORAL_TABLET | Freq: Three times a day (TID) | ORAL | 0 refills | Status: DC | PRN

## 2016-05-21 MED ORDER — PANTOPRAZOLE SODIUM 40 MG PO TBEC: 40.0000 mg | DELAYED_RELEASE_TABLET | Freq: Every day | ORAL | 3 refills | Status: AC

## 2016-05-21 MED ORDER — ORAL CARE MOUTH RINSE
15.0000 mL | Freq: Two times a day (BID) | OROMUCOSAL | Status: DC
  Administered 2016-05-21 (×2): 15 mL via OROMUCOSAL

## 2016-05-21 NOTE — Care Management Important Message (Signed)
Important Message  Patient Details  Name: [name] Lader MRN: [id number] Date of Birth: 1945/06/13   Medicare Important Message Given:  Yes    Epifanio LeschesCole, Khyler Urda Hudson, RN 05/21/2016, 4:18 PM

## 2016-05-21 NOTE — Clinical Social Work Note (Addendum)
CSW advised by MD that patient medically stable. Contact made with Bjorn Loserhonda, admissions director at [facility] to inform her of discharge and d/c clinicals  transmitted through [facility]. Patient informed of discharge and asked CSW to contact his niece - Daniel Nonesammy Glover [phone]) regarding discharge. Niece contacted and informed of discharge. Patient will be transported back to facility by ambulance.  Genelle BalVanessa Tenika Keeran, MSW, LCSW Licensed Clinical Social Worker Clinical Social Work Department [facility] [phone]

## 2016-05-21 NOTE — Discharge Summary (Signed)
Physician Discharge Summary  Ronald Simpson RUE:[id number] DOB: 1946-03-01 DOA: 05/15/2016  PCP: [name], MD  Admit date: 05/15/2016 Discharge date: 05/21/2016  Time spent: 60 minutes  Recommendations for Outpatient Follow-up:  1. Patient be discharged to a skilled nursing facility. Patient is to follow-up with M.D. at the skilled nursing facility. Patient needs a CBC and a basic metabolic profile done in 1 week to follow-up on hemoglobin, electrolytes and renal function. 2. Patient's Coumadin to be resumed on 05/25/2016.   Discharge Diagnoses:  Principal Problem:   Melanotic stools Active Problems:   Acute GI bleeding   Hypothyroidism, acquired   Uncontrolled type 2 diabetes mellitus with complication (HCC)   Tobacco abuse   Chronic anticoagulation   Cirrhosis (HCC)   Discharge Condition: Stable  Diet recommendation: Heart healthy  Filed Weights   05/17/16 2214 05/18/16 2144 05/19/16 2252  Weight: 90.3 kg (199 lb) 92.4 kg (203 lb 9.6 oz) 90.8 kg (200 lb 1.6 oz)    History of present illness:  Per Dr. Cena Benton 05/15/2016 Ronald Simpson is a 71 y.o. male with medical history significant of paroxysmal atrial fibrillation on Coumadin and beta blocker. Presenting to the hospital after patient was found to have melanotic stools. Patient states that the problem started today. Nothing he is aware of makes it better or worse. The problem has been persistent since onset. The melanotic stools or not associated with abdominal discomfort  ED Course: Patient was found to have a hemoglobin of 8.8 and we were subsequently consulted for further medical evaluation recommendations regarding reported melanotic stools.   Hospital Course:  #1 melanotic stools Patient had presented with melanotic stools noted on admission to have a hemoglobin of 8.8 and noted to be on chronic Coumadin therapy. Coumadin was held and patient was placed on telemetry and serial CBCs were obtained. GI  consultation was obtained and patient was seen in consultation by Dr. Russella Dar. Due to concerns for upper GI bleed in the setting of chronic Coumadin use her upper endoscopy was recommended once INR was less than 2. Patient was placed on a PPI infusion and subsequently transitioned to oral Protonix. Patient underwent upper endoscopy on 05/17/2016 which showed normal esophagus, erythematous mucosa in the antrum, small right ischial hernia, non-bleeding duodenal diverticulum, normal blood no bowel been third portion of the duodenum. Colonoscopy was recommended for further evaluation however patient refused colonoscopy, was declining blood draws and asking to go back to the skilled nursing facility. Patient did not have any further signs of overt GI bleed. Patient's hemoglobin stabilized at 8.2. GI had nothing further to offerwas requested to be discharged to the skilled nursing facility patient be discharged back on a PPI. Outpatient follow-up. The gastroenterology patient may resume his Coumadin at home dose on 05/25/2016.  #2 acute blood loss anemia Secondary to problem #1. See problem #1.  #3 hypothyroidism Patient was maintained on home dose of Synthroid.  #4 ?? Cirrhosis with ascites/history of chronic hepatitis C Patient was noted per abdominal ultrasound to have cirrhosis with ascites. Patient was followed by gastroenterology. Patient underwent a diagnostic and therapeutic paracentesis with 1.8 L removed. Patient was initially placed on IV Rocephin for SBP prophylaxis. Cell count obtained from paracentesis was negative for SBP. Total protein was 3.9. SAAG 0.6 and per GI felt ascites was likely not due to cirrhosis and could be secondary to renal or cardiac causes. Patient remained asymptomatic for the rest of the hospitalization. Patient was refusing blood draws refusing any further workup and requesting to  be discharged to the skilled nursing facility. Patient's diuretics will be resumed on discharge.  Outpatient follow-up.  #5 paroxysmal atrial fibrillation Patient remained rate controlled during the hospitalization. Due to melanotic stools Coumadin was held during the hospitalization. Patient did not have any further melanotic stools and his hemoglobin stabilized. Patient underwent upper endoscopy and refused colonoscopy. It was recommended per GI that patient may resume his Coumadin on 05/25/2016. Outpatient follow-up.  #6 diabetes mellitus Patient was maintained on sliding scale insulin throughout the hospitalization. Outpatient follow-up.  The rest of patient's chronic medical issues remained stable throughout the hospitalization patient be discharged in stable and improved condition.  Procedures:  Upper endoscopy Dr. Russella Dar 05/17/2016  Ultrasound-guided paracentesis 05/19/2016 per Dr. Loreta Ave------ 1.8 L of peritoneal fluid removed  Right upper quadrant ultrasound 05/16/2016--- cirrhotic liver with significant ascites  Consultations:  Wound care 05/16/2016  Gastroenterology Dr. Russella Dar 05/16/2016    Discharge Exam: Vitals:   05/21/16 0420 05/21/16 0856  BP: 139/90 134/90  Pulse: 81 82  Resp: 18 18  Temp: 97.8 F (36.6 C) 97.8 F (36.6 C)    General: NAD Cardiovascular: RRR Respiratory: CTAB anterior lung fields.  Discharge Instructions   Discharge Instructions    Diet - low sodium heart healthy    Complete by:  As directed    Increase activity slowly    Complete by:  As directed      Current Discharge Medication List    START taking these medications   Details  pantoprazole (PROTONIX) 40 MG tablet Take 1 tablet (40 mg total) by mouth daily. Qty: 30 tablet, Refills: 3      CONTINUE these medications which have CHANGED   Details  traMADol (ULTRAM) 50 MG tablet Take 1 tablet (50 mg total) by mouth every 8 (eight) hours as needed for moderate pain. Qty: 20 tablet, Refills: 0    warfarin (COUMADIN) 4 MG tablet Take 1 tablet (4 mg total) by mouth daily at  6 PM. Resume on 05/25/2016      CONTINUE these medications which have NOT CHANGED   Details  acetaminophen (TYLENOL) 325 MG tablet Take 650 mg by mouth every 6 (six) hours.     amitriptyline (ELAVIL) 25 MG tablet Take 25 mg by mouth at bedtime.    atorvastatin (LIPITOR) 40 MG tablet Take 40 mg by mouth daily.    barrier cream (NON-SPECIFIED) CREA Apply 1 application topically 2 (two) times daily.    carvedilol (COREG) 6.25 MG tablet Take 6.25 mg by mouth 2 (two) times daily with a meal.    docusate sodium (COLACE) 100 MG capsule Take 100 mg by mouth 2 (two) times daily.    DULoxetine (CYMBALTA) 30 MG capsule Take 30 mg by mouth daily.    ferrous sulfate 325 (65 FE) MG tablet Take 325 mg by mouth 2 (two) times daily with a meal.    furosemide (LASIX) 40 MG tablet Take 40 mg by mouth daily.     hydrALAZINE (APRESOLINE) 25 MG tablet Take 25 mg by mouth 3 (three) times daily.    insulin glargine (LANTUS) 100 unit/mL SOPN Inject 17 Units into the skin at bedtime.    ipratropium-albuterol (DUONEB) 0.5-2.5 (3) MG/3ML SOLN Take 3 mLs by nebulization every 6 (six) hours as needed (shortness of breath).     isosorbide mononitrate (IMDUR) 30 MG 24 hr tablet Take 1 tablet (30 mg total) by mouth daily. Qty: 30 tablet, Refills: 1    levothyroxine (SYNTHROID, LEVOTHROID) 25 MCG tablet Take 25  mcg by mouth daily before breakfast.    nicotine (NICODERM CQ - DOSED IN MG/24 HOURS) 14 mg/24hr patch Place 14 mg onto the skin daily.    nitroGLYCERIN (NITROSTAT) 0.4 MG SL tablet Place 0.4 mg under the tongue every 5 (five) minutes as needed for chest pain.    OXYGEN Inhale into the lungs. 2lpm via [place] prn for shortness of breath or O2 sat <90%    pregabalin (LYRICA) 100 MG capsule Take one capsule by mouth every 8 hours for pains. Do not crush (control) Qty: 90 capsule, Refills: 0       Allergies  Allergen Reactions  . Percocet [Oxycodone-Acetaminophen]     Needing narcan on multiple occasions  due to oversedation after getting percocet    Follow-up Information    MD AT SNF Follow up.   Why:  Follow-up with M.D. at skilled nursing facility.           The results of significant diagnostics from this hospitalization (including imaging, microbiology, ancillary and laboratory) are listed below for reference.    Significant Diagnostic Studies: US Paracentesis  Result Date: 05/20/2016 INDICATION: Patient with hepatic encephalopathy and decompensated cirrhosis. Request made for diagnostic and therapeutic paracentesis EXAM: ULTRASOUND GUIDED DIAGNOSTIC AND THERAPEUTIC PARACENTESIS MEDICATIONS: 10 mL 1% lidocaine COMPLICATIONS: None immediate. PROCEDURE: Informed written consent was obtained from the patient after a discussion of the risks, benefits and alternatives to treatment. A timeout was performed prior to the initiation of the procedure. Initial ultrasound scanning demonstrates a moderate amount of ascites within the left lateral abdomen. The left lateral abdomen was prepped and draped in the usual sterile fashion. 1% lidocaine was used for local anesthesia. Following this, a 19 gauge, 7-cm, Yueh catheter was introduced. An ultrasound image was saved for documentation purposes. The paracentesis was performed. The catheter was removed and a dressing was applied. The patient tolerated the procedure well without immediate post procedural complication. FINDINGS: A total of approximately 1.8 liters of clear, yellow fluid was removed. Samples were sent to the laboratory as requested by the clinical team. IMPRESSION: Successful ultrasound-guided paracentesis yielding 1.8 liters of peritoneal fluid. Read by:  Loyce Dys PA-C Electronically Signed   By: Gilmer Mor D.O.   On: 05/19/2016 16:35   US Abdomen Limited Ruq  Result Date: 05/16/2016 CLINICAL DATA:  Cirrhosis, history of hepatitis-B and hepatitis-C, type II diabetes mellitus EXAM: US ABDOMEN LIMITED - RIGHT UPPER QUADRANT COMPARISON:   CT abdomen pelvis 12/27/2015 FINDINGS: Gallbladder: Partially contracted gallbladder. Gallbladder wall appears minimally thickened though this may be in part related to incomplete distention and in part due to ascites. No shadowing gallstones, pericholecystic fluid or sonographic Murphy sign. Common bile duct: Diameter: 4 mm diameter , normal Liver: Heterogeneous echogenicity. Slightly nodular hepatic margins consistent with cirrhosis. No discrete hepatic mass. No intrahepatic biliary dilatation. Significant ascites noted throughout the abdomen. IMPRESSION: Cirrhotic liver with significant ascites. Otherwise negative exam. Electronically Signed   By: Ulyses Southward M.D.   On: 05/16/2016 17:46    Microbiology: Recent Results (from the past 240 hour(s))  MRSA PCR Screening     Status: Abnormal   Collection Time: 05/16/16  8:39 PM  Result Value Ref Range Status   MRSA by PCR POSITIVE (A) NEGATIVE Final    Comment:        The GeneXpert MRSA Assay (FDA approved for NASAL specimens only), is one component of a comprehensive MRSA colonization surveillance program. It is not intended to diagnose MRSA infection nor to guide  or monitor treatment for MRSA infections. RESULT CALLED TO, READ BACK BY AND VERIFIED WITH: E CASTRO RN 2247 05/16/16 A BROWNING   Gram stain     Status: None   Collection Time: 05/19/16  4:49 PM  Result Value Ref Range Status   Specimen Description FLUID  Final   Special Requests PERITONEAL  Final   Gram Stain   Final    WBC PRESENT, PREDOMINANTLY PMN CYTOSPIN SMEAR NO ORGANISMS SEEN    Report Status 05/19/2016 FINAL  Final  Culture, body fluid-bottle     Status: None (Preliminary result)   Collection Time: 05/19/16  4:49 PM  Result Value Ref Range Status   Specimen Description FLUID PERITONEAL  Final   Special Requests NONE  Final   Culture NO GROWTH 2 DAYS  Final   Report Status PENDING  Incomplete     Labs: Basic Metabolic Panel:  Recent Labs Lab 05/15/16 1440  05/15/16 1521 05/16/16 0219 05/18/16 1111  NA 140 141 138 135  K 4.1 4.0 3.9 4.5  CL 107 104 105 103  CO2 27  --  26 25  GLUCOSE 110* 105* 214* 146*  BUN 35* 36* 35* 28*  CREATININE 1.08 1.10 1.12 1.24  CALCIUM 8.2*  --  7.9* 8.1*   Liver Function Tests:  Recent Labs Lab 05/15/16 1440 05/18/16 1111  AST 16 18  ALT 9* 10*  ALKPHOS 109 93  BILITOT 0.6 0.5  PROT 6.5 6.4*  ALBUMIN 2.2* 2.2*   No results for input(s): LIPASE, AMYLASE in the last 168 hours.  Recent Labs Lab 05/18/16 1111  AMMONIA 31   CBC:  Recent Labs Lab 05/15/16 1440  05/17/16 0852 05/17/16 1528 05/17/16 2343 05/20/16 1032 05/21/16 0743  WBC 6.6  < > 5.4 5.2 5.2 4.9 6.0  NEUTROABS 4.0  --   --   --   --   --   --   HGB 8.2*  < > 8.6* 8.3* 7.9* 8.3* 8.2*  HCT 26.6*  < > 27.9* 26.4* 25.5* 27.3* 26.6*  MCV 86.9  < > 85.8 87.7 86.7 88.3 87.8  PLT 230  < > 201 203 217 221 229  < > = values in this interval not displayed. Cardiac Enzymes: No results for input(s): CKTOTAL, CKMB, CKMBINDEX, TROPONINI in the last 168 hours. BNP: BNP (last 3 results) No results for input(s): BNP in the last 8760 hours.  ProBNP (last 3 results) No results for input(s): PROBNP in the last 8760 hours.  CBG:  Recent Labs Lab 05/20/16 1129 05/20/16 1653 05/21/16 0117 05/21/16 0752 05/21/16 1143  GLUCAP 152* 150* 171* 156* 169*       Signed:  Jaedyn Lard MD.  [facility] 05/21/2016, 3:57 PM

## 2016-05-21 NOTE — Progress Notes (Signed)
Ronald Simpson to be D/C'd to [facility] per MD order.  Discussed prescriptions and follow up appointments with the patient. Prescriptions given to patient, medication list explained in detail. Pt verbalized understanding.  Allergies as of 05/21/2016      Reactions   Percocet [oxycodone-acetaminophen]    Needing narcan on multiple occasions due to oversedation after getting percocet       Medication List    TAKE these medications   acetaminophen 325 MG tablet Commonly known as:  TYLENOL Take 650 mg by mouth every 6 (six) hours.   amitriptyline 25 MG tablet Commonly known as:  ELAVIL Take 25 mg by mouth at bedtime.   atorvastatin 40 MG tablet Commonly known as:  LIPITOR Take 40 mg by mouth daily.   barrier cream Crea Commonly known as:  non-specified Apply 1 application topically 2 (two) times daily.   carvedilol 6.25 MG tablet Commonly known as:  COREG Take 6.25 mg by mouth 2 (two) times daily with a meal.   docusate sodium 100 MG capsule Commonly known as:  COLACE Take 100 mg by mouth 2 (two) times daily.   DULoxetine 30 MG capsule Commonly known as:  CYMBALTA Take 30 mg by mouth daily.   ferrous sulfate 325 (65 FE) MG tablet Take 325 mg by mouth 2 (two) times daily with a meal.   furosemide 40 MG tablet Commonly known as:  LASIX Take 40 mg by mouth daily.   hydrALAZINE 25 MG tablet Commonly known as:  APRESOLINE Take 25 mg by mouth 3 (three) times daily.   insulin glargine 100 unit/mL Sopn Commonly known as:  LANTUS Inject 17 Units into the skin at bedtime.   ipratropium-albuterol 0.5-2.5 (3) MG/3ML Soln Commonly known as:  DUONEB Take 3 mLs by nebulization every 6 (six) hours as needed (shortness of breath).   isosorbide mononitrate 30 MG 24 hr tablet Commonly known as:  IMDUR Take 1 tablet (30 mg total) by mouth daily.   levothyroxine 25 MCG tablet Commonly known as:  SYNTHROID, LEVOTHROID Take 25 mcg by mouth daily before breakfast.   nicotine 14  mg/24hr patch Commonly known as:  NICODERM CQ - dosed in mg/24 hours Place 14 mg onto the skin daily.   nitroGLYCERIN 0.4 MG SL tablet Commonly known as:  NITROSTAT Place 0.4 mg under the tongue every 5 (five) minutes as needed for chest pain.   OXYGEN Inhale into the lungs. 2lpm via [place] prn for shortness of breath or O2 sat <90%   pantoprazole 40 MG tablet Commonly known as:  PROTONIX Take 1 tablet (40 mg total) by mouth daily. Start taking on:  05/22/2016   pregabalin 100 MG capsule Commonly known as:  LYRICA Take one capsule by mouth every 8 hours for pains. Do not crush (control) What changed:  how much to take  how to take this  when to take this  additional instructions   traMADol 50 MG tablet Commonly known as:  ULTRAM Take 1 tablet (50 mg total) by mouth every 8 (eight) hours as needed for moderate pain.   warfarin 4 MG tablet Commonly known as:  COUMADIN Take 1 tablet (4 mg total) by mouth daily at 6 PM. Resume on 05/25/2016 Start taking on:  05/25/2016 What changed:  additional instructions       Vitals:   05/21/16 0856 05/21/16 1752  BP: 134/90 140/76  Pulse: 82 87  Resp: 18 17  Temp: 97.8 F (36.6 C) 98.7 F (37.1 C)    Skin  clean, dry and intact without evidence of skin break down, no evidence of skin tears noted. IV catheter discontinued intact. Site without signs and symptoms of complications. Dressing and pressure applied. Pt denies pain at this time. No complaints noted.  An After Visit Summary was printed and given to the patient. Patient escorted via stretcher, and [facility]  Ronald BologneseAnisha Mabe RN, BSN

## 2016-05-22 ENCOUNTER — Encounter: Payer: Self-pay | Admitting: Internal Medicine

## 2016-05-22 ENCOUNTER — Non-Acute Institutional Stay (SKILLED_NURSING_FACILITY): Payer: Medicare Other | Admitting: Internal Medicine

## 2016-05-22 DIAGNOSIS — I480 Paroxysmal atrial fibrillation: Secondary | ICD-10-CM

## 2016-05-22 DIAGNOSIS — R4182 Altered mental status, unspecified: Secondary | ICD-10-CM

## 2016-05-22 DIAGNOSIS — I96 Gangrene, not elsewhere classified: Secondary | ICD-10-CM | POA: Diagnosis not present

## 2016-05-22 DIAGNOSIS — K922 Gastrointestinal hemorrhage, unspecified: Secondary | ICD-10-CM

## 2016-05-22 NOTE — Assessment & Plan Note (Signed)
Restart warfarin 2/11 and monitor for recurrent bleeding

## 2016-05-22 NOTE — Assessment & Plan Note (Signed)
Wound Care to continue monitor  F/U with Dr Early as scheduled

## 2016-05-22 NOTE — Assessment & Plan Note (Addendum)
Tramadol was restarted in the hospital, on exam 05/22/16 he is again profoundly somnolent. His sensitivity may relate to his hepatic dysfunction in context of Hep C PMH He has received Narcan on at least 3 occasions for opioid related sedation Tramadol will be discontinued.

## 2016-05-22 NOTE — Patient Instructions (Addendum)
See assessment and plan under each diagnosis in the problem list and acutely for this visit Total time 42  minutes; greater than 50% of the visit spent counseling patient and coordinating care for problems addressed at this encounter  

## 2016-05-22 NOTE — Progress Notes (Signed)
[facility] Room: [address]  [facility], [place] No address on file  This is a nursing facility follow up for Nursing Facility readmission within 30 days  Interim medical record and care since last [facility] visit was updated with review of diagnostic studies and change in clinical status since last visit were documented.  HPI: Patient was hospitalized 2/1-05/21/16 for active GI bleeding manifested as frankly melenous stools with visible intermixed blood. At [facility] he was on warfarin for history of paroxysmal atrial fibrillation and peripheral vascular disease  The day of admission to the hospital he was found to have gross melena mixed with blood. Hemoglobin was 8.8. He received a PPI infusion and subsequently was transitioned to oral Protonix. EGD was performed once the INR was less than 2. EGD 2/3 by Dr. Russella Dar to  the third portion of the duodenum revealed mild gastritis but no active bleeding . Colonoscopy was refused by patient. Also he was declining blood draws. Hemoglobin stabillized @ 8.2. GI stated Coumadin could be restarted 2/11. The patient has a history of chronic hepatitis C. Ultrasound suggested cirrhosis with ascites. At paracentesis 1.8 L of fluid was removed. Cell count was negative for SBP. GI felt the ascites was related to renal or cardiac causes. Although he has a history of paroxysmal atrial fibrillation rate was controlled during hospitalization. On 2/4 creatinine was 1.24. Glucoses ranged 105-214. A1c was 6.5% on 04/02/16.  Review of systems: He describes dyspnea with minimal exertion. He is essentially bedbound. He is concerned about wound care of the left foot. He saw Dr Arbie Cookey 1/10 in followup He is also concerned about receiving more pain medication and the associated mental status changes manifested as drowsiness.  Constitutional: No fever  Eyes: No redness, discharge, pain, vision change ENT/mouth: No nasal congestion,   purulent discharge, earache,change in hearing ,sore throat,epistaxis  Cardiovascular: No chest pain, palpitations,paroxysmal nocturnal dyspnea, claudication, edema  Respiratory: No cough, sputum production,hemoptysis,  significant snoring,apnea   Gastrointestinal: No heartburn,dysphagia,abdominal pain, nausea / vomiting,rectal bleeding, melena,change in bowels @ this time Genitourinary: No dysuria,hematuria, pyuria,  incontinence, nocturia Musculoskeletal: No joint stiffness, joint swelling, weakness,pain Dermatologic: No rash, pruritus, change in appearance of skin Neurologic: No dizziness,headache,syncope, seizures, numbness , tingling Psychiatric: No significant anxiety , depression, insomnia, anorexia Endocrine: No change in hair/skin/ nails, excessive thirst, excessive hunger, excessive urination  Hematologic/lymphatic: No significant bruising, lymphadenopathy,abnormal bleeding Allergy/immunology: No itchy/ watery eyes, significant sneezing, urticaria, angioedema  Physical exam:  Pertinent or positive findings:When I entered the room, he was profoundly somnolent and difficult to arouse. Proptosis suggested. His head is shaven. Maxilla is edentulous;few remaining lower teeth. Heart sounds are distant. Breath sounds also decreased.  Abdomen is distended and quiet. There is no ileus.   AKA on the right. Pedal pulses of the left are decreased. Nails are thickened. The surgical wound site of the lateral foot @ absent fourth and fifth toes appears to have adequate granulation.   General appearance:Adequately nourished; no acute distress , increased work of breathing is present.   Lymphatic: No lymphadenopathy about the head, neck, axilla . Eyes: No conjunctival inflammation or lid edema is present. There is no scleral icterus. Ears:  External ear exam shows no significant lesions or deformities.   Nose:  External nasal examination shows no deformity or inflammation. Nasal mucosa are pink and  moist without lesions ,exudates Oral exam: lips and gums are healthy appearing.There is no oropharyngeal erythema or exudate . Neck:  No thyromegaly, masses, tenderness  noted.    Heart:  Clinically regular rhythm. S1 and S2 normal without gallop, murmur, click, rub .  Lungs:Chest clear to auscultation without wheezes, rhonchi,rales , rubs. Abdomen: nontender with no organomegaly, hernias,masses. GU: deferred  Extremities:  No cyanosis, clubbing,edema  Skin: Warm & dry w/o tenting. No significant rash.  See summary under each active problem in the Problem List with associated updated therapeutic plan

## 2016-05-23 NOTE — Assessment & Plan Note (Signed)
05/22/16 clinically rate controlled & rhythm rgular

## 2016-05-23 NOTE — Assessment & Plan Note (Signed)
As per GI

## 2016-05-24 LAB — CULTURE, BODY FLUID W GRAM STAIN -BOTTLE: Culture: NO GROWTH

## 2016-05-24 LAB — CULTURE, BODY FLUID-BOTTLE

## 2016-05-27 ENCOUNTER — Encounter: Payer: Self-pay | Admitting: Vascular Surgery

## 2016-05-27 ENCOUNTER — Non-Acute Institutional Stay (SKILLED_NURSING_FACILITY): Payer: Medicare Other | Admitting: Nurse Practitioner

## 2016-05-27 ENCOUNTER — Ambulatory Visit (INDEPENDENT_AMBULATORY_CARE_PROVIDER_SITE_OTHER): Payer: Self-pay | Admitting: Vascular Surgery

## 2016-05-27 ENCOUNTER — Encounter: Payer: Self-pay | Admitting: Nurse Practitioner

## 2016-05-27 VITALS — BP 161/97 | HR 88 | Temp 97.1°F | Resp 18 | Ht 70.0 in | Wt 200.0 lb

## 2016-05-27 DIAGNOSIS — G63 Polyneuropathy in diseases classified elsewhere: Secondary | ICD-10-CM

## 2016-05-27 DIAGNOSIS — I480 Paroxysmal atrial fibrillation: Secondary | ICD-10-CM | POA: Diagnosis not present

## 2016-05-27 DIAGNOSIS — R4182 Altered mental status, unspecified: Secondary | ICD-10-CM

## 2016-05-27 DIAGNOSIS — I739 Peripheral vascular disease, unspecified: Secondary | ICD-10-CM

## 2016-05-27 NOTE — Progress Notes (Signed)
Nursing Home Location:  [facility]  Place of Service: SNF (31)  PCP: [name], MD  Allergies  Allergen Reactions  . Percocet [Oxycodone-Acetaminophen]     Needing narcan on multiple occasions due to oversedation after getting percocet   . Tramadol     Excess sedation; ? Related to pre-existing hepatic dysfunction with PMH Hep C    Chief Complaint  Patient presents with  . Acute Visit    HPI:  Patient is a 71 y.o. Simpson seen today at [facility] for follow up on pain. . Pt with hx of CVA, hep b and C, PVD, AKA, CAD, DM. Pt was hospitalized in December and had amputation of 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. Treatment nurse and wound NP following wound which has been stable. Pt following with Dr Arbie Cookey. pts pain medication has been stopped due to increase lethargy. Pt contiuned to be very lethargic and asked nursing if other medications could be adjusted. Lyrica was decreased from 100 mg TID to 50 mg BID. Pt doing much better on this dose. Reports no increase in pain, neuropathy or phantom pain. Much more alert and reports he is "getting back to myself" INR was not preformed today because he was woken up with needle in his arm and he was not aware of what was going on so he told phlebotomist to stop. No further bleeding noted.   Review of Systems:  Review of Systems  Constitutional: Negative for chills, fatigue and fever.  HENT: Negative for congestion, nosebleeds, sore throat and tinnitus.   Respiratory: Negative for cough and shortness of breath.   Cardiovascular: Negative for chest pain, palpitations and leg swelling.  Gastrointestinal: Negative for abdominal pain, constipation and diarrhea.  Genitourinary: Negative for dysuria, frequency and urgency.  Musculoskeletal: Positive for arthralgias. Negative for back pain and myalgias.  Skin: Positive for wound.       Amputated 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, wound covered and dressing CDI  Neurological: Negative for dizziness and headaches.         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 CAD (coronary artery disease)    01/15/16 he states he has had a heart attack  . CVA (cerebral infarction)   . DM (diabetes mellitus), type 2 with peripheral vascular complications (HCC)   . History of hepatitis C 1990s   Hep B immune  . Peripheral vascular disease [facility])    Past Surgical History:  Procedure Laterality Date  . AMPUTATION Right 01/07/2016   Procedure: AMPUTATION ABOVE KNEE;  Surgeon: Larina Earthly, MD;  Location: [facility] OR;  Service: Vascular;  Laterality: Right;  . AMPUTATION Left 04/04/2016   Procedure: LEFT FOURTH AND FIFTH  TOE AMPUTATION;  Surgeon: Larina Earthly, MD;  Location: [facility] OR;  Service: Vascular;  Laterality: Left;  . BELOW KNEE LEG AMPUTATION Right   . CORONARY ARTERY BYPASS GRAFT  2005  . ESOPHAGOGASTRODUODENOSCOPY N/A 05/17/2016   Procedure: ESOPHAGOGASTRODUODENOSCOPY (EGD);  Surgeon: Meryl Dare, MD;  Location: [facility] ENDOSCOPY;  Service: Endoscopy;  Laterality: N/A;  . PERIPHERAL VASCULAR CATHETERIZATION N/A 01/11/2016   Procedure: Lower Extremity Angiography;  Surgeon: Nada Libman, MD;  Location: [facility] INVASIVE CV LAB;  Service: Cardiovascular;  Laterality: N/A;  . STERNOTOMY     Social History:   reports that he has quit smoking. His smoking use included Cigarettes. He has a 5.40 pack-year smoking history. He has never used smokeless tobacco. He  reports that he drinks alcohol. He reports that he uses drugs, including Cocaine and Marijuana.  Family History  Problem Relation Age of Onset  . Hypertension Other   . Diabetes Other   . Mental illness Other   . Lupus Other   . Diabetes Father   . Cancer Neg Hx   . Heart disease Neg Hx   . Stroke Neg Hx     Medications: Patient's Medications  New Prescriptions   No medications on file  Previous Medications   ACETAMINOPHEN (TYLENOL) 325 MG TABLET    Take 650 mg by mouth every 6 (six) hours.    AMITRIPTYLINE  (ELAVIL) 25 MG TABLET    Take 25 mg by mouth at bedtime.   ATORVASTATIN (LIPITOR) 40 MG TABLET    Take 40 mg by mouth daily.   BARRIER CREAM (NON-SPECIFIED) CREA    Apply 1 application topically 2 (two) times daily.   CARVEDILOL (COREG) 6.25 MG TABLET    Take 6.25 mg by mouth 2 (two) times daily with a meal.   DOCUSATE SODIUM (COLACE) 100 MG CAPSULE    Take 100 mg by mouth 2 (two) times daily.   DULOXETINE (CYMBALTA) 30 MG CAPSULE    Take 30 mg by mouth daily.   FERROUS SULFATE 325 (65 FE) MG TABLET    Take 325 mg by mouth 2 (two) times daily with a meal.   FUROSEMIDE (LASIX) 40 MG TABLET    Take 40 mg by mouth daily.    HYDRALAZINE (APRESOLINE) 25 MG TABLET    Take 25 mg by mouth 3 (three) times daily.   INSULIN GLARGINE (LANTUS) 100 UNIT/ML SOPN    Inject 17 Units into the skin at bedtime.   IPRATROPIUM-ALBUTEROL (DUONEB) 0.5-2.5 (3) MG/3ML SOLN    Take 3 mLs by nebulization every 6 (six) hours as needed (shortness of breath).    ISOSORBIDE MONONITRATE (IMDUR) 30 MG 24 HR TABLET    Take 1 tablet (30 mg total) by mouth daily.   LEVOTHYROXINE (SYNTHROID, LEVOTHROID) 25 MCG TABLET    Take 25 mcg by mouth daily before breakfast.   NICOTINE (NICODERM CQ - DOSED IN MG/24 HOURS) 14 MG/24HR PATCH    Place 14 mg onto the skin daily.   NITROGLYCERIN (NITROSTAT) 0.4 MG SL TABLET    Place 0.4 mg under the tongue every 5 (five) minutes as needed for chest pain.   OXYGEN    Inhale into the lungs. 2lpm via [place] prn for shortness of breath or O2 sat <90%   PANTOPRAZOLE (PROTONIX) 40 MG TABLET    Take 1 tablet (40 mg total) by mouth daily.   PREGABALIN (LYRICA) 50 MG CAPSULE    Take 50 mg by mouth every 12 (twelve) hours.   WARFARIN (COUMADIN) 4 MG TABLET    Take 1 tablet (4 mg total) by mouth daily at 6 PM. Resume on 05/25/2016  Modified Medications   No medications on file  Discontinued Medications   PREGABALIN (LYRICA) 100 MG CAPSULE    Take one capsule by mouth every 8 hours for pains. Do not crush (control)      Physical Exam: Vitals:   05/27/16 1106  BP: 138/72  Pulse: 78  Resp: 20  Temp: 97.4 F (36.3 C)  SpO2: 94%  Weight: 200 lb (90.7 kg)  Height: 5\' 10"  (1.778 m)    Physical Exam  Constitutional: He is oriented to person, place, and time. He appears well-developed and well-nourished. No distress.  HENT:  Head:  Normocephalic and atraumatic.  Mouth/Throat: Oropharynx is clear and moist. No oropharyngeal exudate.  Eyes: Conjunctivae and EOM are normal. Pupils are equal, round, and reactive to light.  Neck: Normal range of motion. Neck supple.  Cardiovascular: Normal rate, regular rhythm and normal heart sounds.   Diminished pedal pulse to left   Pulmonary/Chest: Effort normal and breath sounds normal.  Abdominal: Soft. Bowel sounds are normal.  Musculoskeletal: He exhibits no edema or tenderness.  Right AKA  Neurological: He is alert and oriented to person, place, and time.  Skin: Skin is warm and dry. He is not diaphoretic.  Dressing to left foot CDI  Psychiatric: He has a normal mood and affect.    Labs reviewed: Basic Metabolic Panel:  Recent Labs  16/01/9601/01/18 1440 05/15/16 1521 05/16/16 0219 05/18/16 1111  NA 140 141 138 135  K 4.1 4.0 3.9 4.5  CL 107 104 105 103  CO2 27  --  26 25  GLUCOSE 110* 105* 214* 146*  BUN 35* 36* 35* 28*  CREATININE 1.08 1.10 1.12 1.24  CALCIUM 8.2*  --  7.9* 8.1*   Liver Function Tests:  Recent Labs  04/02/16 1046 05/15/16 1440 05/18/16 1111  AST 16 16 18   ALT 12* 9* 10*  ALKPHOS 119 109 93  BILITOT 1.0 0.6 0.5  PROT 6.8 6.5 6.4*  ALBUMIN 2.7* 2.2* 2.2*   No results for input(s): LIPASE, AMYLASE in the last 8760 hours.  Recent Labs  04/02/16 1121 05/18/16 1111  AMMONIA 53* 31   CBC:  Recent Labs  12/26/15 1908  04/02/16 1046  05/15/16 1440  05/17/16 2343 05/20/16 1032 05/21/16 0743  WBC 26.1*  < > 12.9*  < > 6.6  < > 5.2 4.9 6.0  NEUTROABS 23.2*  --  9.7*  --  4.0  --   --   --   --   HGB 12.2*  < >  9.1*  < > 8.2*  < > 7.9* 8.3* 8.2*  HCT 36.4*  < > 28.9*  < > 26.6*  < > 25.5* 27.3* 26.6*  MCV 89.4  < > 93.8  < > 86.9  < > 86.7 88.3 87.8  PLT 337  < > 185  < > 230  < > 217 221 229  < > = values in this interval not displayed. TSH:  Recent Labs  03/03/16  TSH 4.44   A1C: Lab Results  Component Value Date   HGBA1C 6.5 (H) 04/02/2016   Lipid Panel: No results for input(s): CHOL, HDL, LDLCALC, TRIG, CHOLHDL, LDLDIRECT in the last 8760 hours. Lab Results  Component Value Date   INR 1.56 05/17/2016   INR 2.82 05/16/2016   INR 2.46 05/15/2016   PROTIME 26.1 (A) 04/30/2016   PROTIME 27.8 (A) 04/23/2016   PROTIME 25.4 (A) 04/18/2016    Assessment/Plan 1. Polyneuropathy associated with underlying disease (HCC) No increase in pain/neuropathy. Pt actually is unaware that any medication has been changed. Will titrate lyrica off at this time. conts on Cymbalta which should help with neuropathy as well.   2. Altered mental status, unspecified altered mental status type Significantly improved  3. Paroxysmal atrial fibrillation (HCC) Rate controlled, no signs of bleeding. Will get INR in am  Lateef Juncaj K. Biagio BorgEubanks, AGNP  [facility] [phone](Monday-Friday 8 am - 5 pm) [phone] (after hours)

## 2016-05-27 NOTE — Progress Notes (Signed)
Patient name: Ronald Simpson MRN: [id number] DOB: 04-23-1945 Sex: male  REASON FOR VISIT: Follow-up open left fourth and fifth toe amputations  HPI: Ronald Simpson is a 71 y.o. male here today for follow-up. Underwent open left fourth and fifth toe amputations on 04/04/2016. He is at a nursing facility. He is undergoing local wound care at this facility  Current Outpatient Prescriptions  Medication Sig Dispense Refill  . acetaminophen (TYLENOL) 325 MG tablet Take 650 mg by mouth every 6 (six) hours.     Marland Kitchen amitriptyline (ELAVIL) 25 MG tablet Take 25 mg by mouth at bedtime.    Marland Kitchen atorvastatin (LIPITOR) 40 MG tablet Take 40 mg by mouth daily.    . barrier cream (NON-SPECIFIED) CREA Apply 1 application topically 2 (two) times daily.    . carvedilol (COREG) 6.25 MG tablet Take 6.25 mg by mouth 2 (two) times daily with a meal.    . docusate sodium (COLACE) 100 MG capsule Take 100 mg by mouth 2 (two) times daily.    . DULoxetine (CYMBALTA) 30 MG capsule Take 30 mg by mouth daily.    . ferrous sulfate 325 (65 FE) MG tablet Take 325 mg by mouth 2 (two) times daily with a meal.    . furosemide (LASIX) 40 MG tablet Take 40 mg by mouth daily.     . hydrALAZINE (APRESOLINE) 25 MG tablet Take 25 mg by mouth 3 (three) times daily.    . insulin glargine (LANTUS) 100 unit/mL SOPN Inject 17 Units into the skin at bedtime.    Marland Kitchen ipratropium-albuterol (DUONEB) 0.5-2.5 (3) MG/3ML SOLN Take 3 mLs by nebulization every 6 (six) hours as needed (shortness of breath).     . isosorbide mononitrate (IMDUR) 30 MG 24 hr tablet Take 1 tablet (30 mg total) by mouth daily. 30 tablet 1  . levothyroxine (SYNTHROID, LEVOTHROID) 25 MCG tablet Take 25 mcg by mouth daily before breakfast.    . nicotine (NICODERM CQ - DOSED IN MG/24 HOURS) 14 mg/24hr patch Place 14 mg onto the skin daily.    . nitroGLYCERIN (NITROSTAT) 0.4 MG SL tablet Place 0.4 mg under the tongue every 5 (five) minutes as  needed for chest pain.    . OXYGEN Inhale into the lungs. 2lpm via [place] prn for shortness of breath or O2 sat <90%    . pantoprazole (PROTONIX) 40 MG tablet Take 1 tablet (40 mg total) by mouth daily. 30 tablet 3  . pregabalin (LYRICA) 100 MG capsule Take one capsule by mouth every 8 hours for pains. Do not crush (control) (Patient taking differently: Take 100 mg by mouth every 8 (eight) hours. ) 90 capsule 0  . warfarin (COUMADIN) 4 MG tablet Take 1 tablet (4 mg total) by mouth daily at 6 PM. Resume on 05/25/2016     No current facility-administered medications for this visit.      PHYSICAL EXAM: Vitals:   05/27/16 1008 05/27/16 1012  BP: (!) 153/89 (!) 161/97  Pulse: 88   Resp: 18   Temp: 97.1 F (36.2 C)   TempSrc: Oral   SpO2: 96%   Weight: 200 lb (90.7 kg)   Height: 5\' 10"  (1.778 m)     GENERAL: The patient is a well-nourished male, in no acute distress. The vital signs are documented above. A good granulating base with no evidence of exposed bone. No involvement of necrosis in the third metatarsal head. Palpable left popliteal pulse  MEDICAL ISSUES: Continued progress regarding local wound care. Will see  us again in 2 months for continued follow-up. Feels it is okay for him to begin weightbearing on his left foot so he can continue with rehabilitation from his right above-knee amputation   Larina Earthlyodd F. Crestina Strike, MD [facility] Office Tel [phone] Pager [phone]

## 2016-06-02 LAB — CBC AND DIFFERENTIAL
HEMATOCRIT: 31 % — AB (ref 41–53)
HEMOGLOBIN: 9.5 g/dL — AB (ref 13.5–17.5)
Platelets: 263 10*3/uL (ref 150–399)
WBC: 6.2 10^3/mL

## 2016-06-02 LAB — PROTIME-INR: Protime: 19 seconds — AB (ref 10.0–13.8)

## 2016-06-02 LAB — POCT INR: INR: 1.6 — AB (ref 0.9–1.1)

## 2016-06-06 ENCOUNTER — Non-Acute Institutional Stay (SKILLED_NURSING_FACILITY): Payer: Medicare Other | Admitting: Nurse Practitioner

## 2016-06-06 DIAGNOSIS — E1151 Type 2 diabetes mellitus with diabetic peripheral angiopathy without gangrene: Secondary | ICD-10-CM

## 2016-06-06 DIAGNOSIS — K922 Gastrointestinal hemorrhage, unspecified: Secondary | ICD-10-CM

## 2016-06-06 DIAGNOSIS — I10 Essential (primary) hypertension: Secondary | ICD-10-CM

## 2016-06-06 DIAGNOSIS — I480 Paroxysmal atrial fibrillation: Secondary | ICD-10-CM

## 2016-06-06 DIAGNOSIS — G63 Polyneuropathy in diseases classified elsewhere: Secondary | ICD-10-CM | POA: Diagnosis not present

## 2016-06-06 DIAGNOSIS — I25810 Atherosclerosis of coronary artery bypass graft(s) without angina pectoris: Secondary | ICD-10-CM

## 2016-06-06 LAB — PROTIME-INR: PROTIME: 23.8 s — AB (ref 10.0–13.8)

## 2016-06-06 LAB — POCT INR: INR: 2.2 — AB (ref 0.9–1.1)

## 2016-06-06 NOTE — Progress Notes (Signed)
Nursing Home Location:  [facility]  Place of Service: SNF (31)  PCP: Tilden DomeSCHNEIDER,RICHARD, MD  Allergies  Allergen Reactions  . Percocet [Oxycodone-Acetaminophen]     Needing narcan on multiple occasions due to oversedation after getting percocet   . Tramadol     Excess sedation; ? Related to pre-existing hepatic dysfunction with PMH Hep C    Chief Complaint  Patient presents with  . Medical Management of Chronic Issues    Resident is being seen for follow up  PVD, AKA, CAD, DM    HPI:  Patient is a 71 y.o. male [facility] follow up on chronic conditions. Pt with hx of CVA, hep b and C, PVD, AKA, CAD, DM.  Pt has been doing well. In the last month he has been several times due to lethargy. lyrica was weaned without and lethargy has improved significantly. No increase in pain or neuropathy.  Pt sleeping well at night. No increase in anxiety or depression.  Pt denies dark tarry stools, diarrhea, constipation, no bruising or bleeding.   Review of Systems:  Review of Systems  Unable to perform ROS: Acuity of condition  Constitutional: Negative for chills and fever.  HENT: Negative for tinnitus.   Respiratory: Negative for cough and shortness of breath.   Cardiovascular: Negative for chest pain, palpitations and leg swelling.  Gastrointestinal: Negative for abdominal pain, constipation and diarrhea.  Genitourinary: Negative for dysuria, frequency and urgency.  Musculoskeletal: Negative for arthralgias, back pain and myalgias.  Skin: Positive for wound.  Neurological: Negative for dizziness and headaches.       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 CAD (coronary artery disease)    01/15/16 he states he has had a heart attack  . CVA (cerebral infarction)   . DM (diabetes mellitus), type 2 with peripheral vascular complications (HCC)   . History of hepatitis C 1990s   Hep B immune  . Peripheral  vascular disease [facility](HCC)    Past Surgical History:  Procedure Laterality Date  . AMPUTATION Right 01/07/2016   Procedure: AMPUTATION ABOVE KNEE;  Surgeon: Larina Earthlyodd F Early, MD;  [facility];  Service: Vascular;  Laterality: Right;  . AMPUTATION Left 04/04/2016   Procedure: LEFT FOURTH AND FIFTH  TOE AMPUTATION;  Surgeon: Larina Earthlyodd F Early, MD;  [facility];  Service: Vascular;  Laterality: Left;  . BELOW KNEE LEG AMPUTATION Right   . CORONARY ARTERY BYPASS GRAFT  2005  . ESOPHAGOGASTRODUODENOSCOPY N/A 05/17/2016   Procedure: ESOPHAGOGASTRODUODENOSCOPY (EGD);  Surgeon: Meryl DareMalcolm T Stark, MD;  [facility];  Service: Endoscopy;  Laterality: N/A;  . PERIPHERAL VASCULAR CATHETERIZATION N/A 01/11/2016   Procedure: Lower Extremity Angiography;  Surgeon: Nada LibmanVance W Brabham, MD;  Location: [facility] INVASIVE CV LAB;  Service: Cardiovascular;  Laterality: N/A;  . STERNOTOMY     Social History:   reports that he has quit smoking. His smoking use included Cigarettes. He has a 5.40 pack-year smoking history. He has never used smokeless tobacco. He reports that he drinks alcohol. He reports that he uses drugs, including Cocaine and Marijuana.  Family History  Problem Relation Age of Onset  . Hypertension Other   . Diabetes Other   . Mental illness Other   . Lupus Other   . Diabetes Father   . Cancer Neg Hx   . Heart disease Neg Hx   . Stroke Neg Hx  Medications: Patient's Medications  New Prescriptions   No medications on file  Previous Medications   ACETAMINOPHEN (TYLENOL) 325 MG TABLET    Take 650 mg by mouth every 6 (six) hours.    AMITRIPTYLINE (ELAVIL) 25 MG TABLET    Take 25 mg by mouth at bedtime.   ATORVASTATIN (LIPITOR) 40 MG TABLET    Take 40 mg by mouth daily.   BARRIER CREAM (NON-SPECIFIED) CREA    Apply 1 application topically 2 (two) times daily.   CARVEDILOL (COREG) 6.25 MG TABLET    Take 6.25 mg by mouth 2 (two) times daily with a meal.   DOCUSATE SODIUM (COLACE) 100 MG CAPSULE     Take 100 mg by mouth 2 (two) times daily.   DULOXETINE (CYMBALTA) 30 MG CAPSULE    Take 30 mg by mouth daily.   FERROUS SULFATE 325 (65 FE) MG TABLET    Take 325 mg by mouth 2 (two) times daily with a meal.   FUROSEMIDE (LASIX) 40 MG TABLET    Take 40 mg by mouth daily.    HYDRALAZINE (APRESOLINE) 25 MG TABLET    Take 25 mg by mouth 3 (three) times daily.   INSULIN GLARGINE (LANTUS) 100 UNIT/ML SOPN    Inject 17 Units into the skin at bedtime.   IPRATROPIUM-ALBUTEROL (DUONEB) 0.5-2.5 (3) MG/3ML SOLN    Take 3 mLs by nebulization every 6 (six) hours as needed (shortness of breath).    ISOSORBIDE MONONITRATE (IMDUR) 30 MG 24 HR TABLET    Take 1 tablet (30 mg total) by mouth daily.   LEVOTHYROXINE (SYNTHROID, LEVOTHROID) 25 MCG TABLET    Take 25 mcg by mouth daily before breakfast.   NICOTINE (NICODERM CQ - DOSED IN MG/24 HOURS) 14 MG/24HR PATCH    Place 14 mg onto the skin daily.   NITROGLYCERIN (NITROSTAT) 0.4 MG SL TABLET    Place 0.4 mg under the tongue every 5 (five) minutes as needed for chest pain.   OXYGEN    Inhale into the lungs. 2lpm via [place] prn for shortness of breath or O2 sat <90%   PANTOPRAZOLE (PROTONIX) 40 MG TABLET    Take 1 tablet (40 mg total) by mouth daily.   WARFARIN (COUMADIN) 4 MG TABLET    Take 1 tablet (4 mg total) by mouth daily at 6 PM. Resume on 05/25/2016  Modified Medications   No medications on file  Discontinued Medications   PREGABALIN (LYRICA) 50 MG CAPSULE    Take 50 mg by mouth every 12 (twelve) hours.     Physical Exam: Vitals:   06/06/16 1138  BP: 130/88  Pulse: 68  Resp: 20  Temp: 97.1 F (36.2 C)  SpO2: 99%  Weight: 192 lb 3.2 oz (87.2 kg)  Height: 5\' 10"  (1.778 m)    Physical Exam  Constitutional: He is oriented to person, place, and time. He appears well-developed and well-nourished. No distress.  HENT:  Head: Normocephalic and atraumatic.  Mouth/Throat: Oropharynx is clear and moist. No oropharyngeal exudate.  Eyes: Conjunctivae and EOM  are normal. Pupils are equal, round, and reactive to light.  Neck: Normal range of motion. Neck supple.  Cardiovascular: Normal rate, regular rhythm and normal heart sounds.   Pulmonary/Chest: Effort normal and breath sounds normal.  Abdominal: Soft. Bowel sounds are normal.  Musculoskeletal: He exhibits no edema or tenderness.  Right AKA  Neurological: He is alert and oriented to person, place, and time.  Skin: Skin is warm and dry. He is  not diaphoretic.  Dressing to left foot, after 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 amputation  Psychiatric: He has a normal mood and affect.    Labs reviewed: Basic Metabolic Panel:  Recent Labs  47/82/95 1440 05/15/16 1521 05/16/16 0219 05/18/16 1111  NA 140 141 138 135  K 4.1 4.0 3.9 4.5  CL 107 104 105 103  CO2 27  --  26 25  GLUCOSE 110* 105* 214* 146*  BUN 35* 36* 35* 28*  CREATININE 1.08 1.10 1.12 1.24  CALCIUM 8.2*  --  7.9* 8.1*   Liver Function Tests:  Recent Labs  04/02/16 1046 05/15/16 1440 05/18/16 1111  AST 16 16 18   ALT 12* 9* 10*  ALKPHOS 119 109 93  BILITOT 1.0 0.6 0.5  PROT 6.8 6.5 6.4*  ALBUMIN 2.7* 2.2* 2.2*   No results for input(s): LIPASE, AMYLASE in the last 8760 hours.  Recent Labs  04/02/16 1121 05/18/16 1111  AMMONIA 53* 31   CBC:  Recent Labs  12/26/15 1908  04/02/16 1046  05/15/16 1440  05/17/16 2343 05/20/16 1032 05/21/16 0743 06/02/16  WBC 26.1*  < > 12.9*  < > 6.6  < > 5.2 4.9 6.0 6.2  NEUTROABS 23.2*  --  9.7*  --  4.0  --   --   --   --   --   HGB 12.2*  < > 9.1*  < > 8.2*  < > 7.9* 8.3* 8.2* 9.5*  HCT 36.4*  < > 28.9*  < > 26.6*  < > 25.5* 27.3* 26.6* 31*  MCV 89.4  < > 93.8  < > 86.9  < > 86.7 88.3 87.8  --   PLT 337  < > 185  < > 230  < > 217 221 229 263  < > = values in this interval not displayed. TSH:  Recent Labs  03/03/16  TSH 4.44   A1C: Lab Results  Component Value Date   HGBA1C 6.5 (H) 04/02/2016   Lipid Panel: No results for input(s): CHOL, HDL, LDLCALC, TRIG, CHOLHDL,  LDLDIRECT in the last 8760 hours.  Lab Results  Component Value Date   INR 1.6 (A) 06/02/2016   INR 1.56 05/17/2016   INR 2.82 05/16/2016   PROTIME 19.0 (A) 06/02/2016   PROTIME 26.1 (A) 04/30/2016   PROTIME 27.8 (A) 04/23/2016     Assessment/Plan 1. Polyneuropathy associated with underlying disease (HCC) Off lyrica, conts on Cymbalta. Denies any worsening neuropathy at this time. More alert without episodes of lethargy  2. Paroxysmal atrial fibrillation (HCC) Rate controlled, conts on coumadin for anticoagulation. Follow up INR today  3. Acute GI bleeding No further bleeding noted, pt denied colonoscopy in hospital. Will have pt follow up with GI doctor for further evaluation, and possible outpatient colonoscopy. hgb trending up.  4. Diabetes mellitus with peripheral vascular disease (HCC) -A1c at goal, no hypoglycemic episodes noted, cont current regimen   5. Benign essential HTN Blood pressure stable. Cont current regimen.   6. Coronary artery disease involving coronary bypass graft of native heart without angina pectoris Stable, without chest pain. Will get fasting lipids next lab day.   Janene Harvey. Biagio Borg  [facility] [phone] 8 am - 5 pm) [phone] (after hours)

## 2016-06-09 LAB — LIPID PANEL
CHOLESTEROL: 92 mg/dL (ref 0–200)
HDL: 38 mg/dL (ref 35–70)
LDL Cholesterol: 38 mg/dL
TRIGLYCERIDES: 81 mg/dL (ref 40–160)

## 2016-06-11 LAB — LIPID PANEL
Cholesterol: 88 mg/dL (ref 0–200)
HDL: 42 mg/dL (ref 35–70)
LDL CALC: 30 mg/dL
Triglycerides: 84 mg/dL (ref 40–160)

## 2016-06-11 LAB — POCT INR: INR: 1.8 — AB (ref 0.9–1.1)

## 2016-06-12 ENCOUNTER — Encounter: Payer: Self-pay | Admitting: *Deleted

## 2016-06-13 LAB — HEPATIC FUNCTION PANEL
ALT: 14 U/L (ref 10–40)
AST: 26 U/L (ref 14–40)
Alkaline Phosphatase: 198 U/L — AB (ref 25–125)
Bilirubin, Total: 0.5 mg/dL

## 2016-06-13 LAB — CBC AND DIFFERENTIAL
HCT: 28 % — AB (ref 41–53)
Hemoglobin: 8.9 g/dL — AB (ref 13.5–17.5)
Platelets: 235 10*3/uL (ref 150–399)
WBC: 5.3 10*3/mL

## 2016-06-13 LAB — BASIC METABOLIC PANEL
BUN: 27 mg/dL — AB (ref 4–21)
CREATININE: 1.3 mg/dL (ref 0.6–1.3)
Glucose: 150 mg/dL
Potassium: 4.4 mmol/L (ref 3.4–5.3)
Sodium: 134 mmol/L — AB (ref 137–147)

## 2016-06-13 LAB — POCT INR: INR: 1.7 — AB (ref 0.9–1.1)

## 2016-06-13 LAB — PROTIME-INR: PROTIME: 19.6 s — AB (ref 10.0–13.8)

## 2016-06-23 LAB — POCT INR: INR: 2.9 — AB (ref 0.9–1.1)

## 2016-06-23 LAB — PROTIME-INR: Protime: 30.3 seconds — AB (ref 10.0–13.8)

## 2016-06-26 LAB — BASIC METABOLIC PANEL
BUN: 32 mg/dL — AB (ref 4–21)
Creatinine: 1.5 mg/dL — AB (ref 0.6–1.3)
Glucose: 132 mg/dL
Potassium: 5 mmol/L (ref 3.4–5.3)
SODIUM: 139 mmol/L (ref 137–147)

## 2016-06-26 LAB — CBC AND DIFFERENTIAL
HEMATOCRIT: 31 % — AB (ref 41–53)
HEMOGLOBIN: 9.4 g/dL — AB (ref 13.5–17.5)
PLATELETS: 163 10*3/uL (ref 150–399)
WBC: 4.8 10^3/mL

## 2016-06-27 ENCOUNTER — Non-Acute Institutional Stay (SKILLED_NURSING_FACILITY): Payer: Medicare Other | Admitting: Nurse Practitioner

## 2016-06-27 ENCOUNTER — Encounter: Payer: Self-pay | Admitting: Nurse Practitioner

## 2016-06-27 DIAGNOSIS — R5383 Other fatigue: Secondary | ICD-10-CM | POA: Diagnosis not present

## 2016-06-27 DIAGNOSIS — R748 Abnormal levels of other serum enzymes: Secondary | ICD-10-CM

## 2016-06-27 DIAGNOSIS — I25810 Atherosclerosis of coronary artery bypass graft(s) without angina pectoris: Secondary | ICD-10-CM | POA: Diagnosis not present

## 2016-06-27 DIAGNOSIS — K5901 Slow transit constipation: Secondary | ICD-10-CM

## 2016-06-27 DIAGNOSIS — I480 Paroxysmal atrial fibrillation: Secondary | ICD-10-CM

## 2016-06-27 DIAGNOSIS — E1151 Type 2 diabetes mellitus with diabetic peripheral angiopathy without gangrene: Secondary | ICD-10-CM

## 2016-06-27 DIAGNOSIS — R188 Other ascites: Secondary | ICD-10-CM | POA: Diagnosis not present

## 2016-06-27 DIAGNOSIS — I10 Essential (primary) hypertension: Secondary | ICD-10-CM
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
## 2016-06-30 LAB — PROTIME-INR: PROTIME: 40.1 s — AB (ref 10.0–13.8)

## 2016-06-30 LAB — POCT INR: INR: 4.1 — AB (ref 0.9–1.1)

## 2016-07-02 ENCOUNTER — Other Ambulatory Visit: Payer: Self-pay | Admitting: Nurse Practitioner

## 2016-07-02 ENCOUNTER — Non-Acute Institutional Stay (SKILLED_NURSING_FACILITY): Payer: Medicare Other | Admitting: Nurse Practitioner

## 2016-07-02 DIAGNOSIS — I10 Essential (primary) hypertension: Secondary | ICD-10-CM | POA: Diagnosis not present

## 2016-07-02 DIAGNOSIS — H539 Unspecified visual disturbance: Secondary | ICD-10-CM | POA: Diagnosis not present

## 2016-07-02 NOTE — Progress Notes (Signed)
Nursing Home Location:  [facility]  Place of Service: SNF (31)  PCP: [name], MD  Allergies  Allergen Reactions  . Percocet [Oxycodone-Acetaminophen]     Needing narcan on multiple occasions due to oversedation after getting percocet   . Tramadol     Excess sedation; ? Related to pre-existing hepatic dysfunction with PMH Hep C    Chief Complaint  Patient presents with  . Acute Visit    elevated blood pressure    HPI:  Patient is a 71 y.o. male seen today at [facility] for routine follow up on chronic conditions. Pt with hx of CVA, hep b and C, PVD, AKA, CAD, DM.   Staff requested pt be seen due to uncontrolled blood pressure. Therapy attempting to work with pt to gain strength so he can go home however pt BP is very elevated during sessions. BP ranging from 160-180/80-110s Pt denies worsening of shortness of breath, chest pains, dizziness, headache or acute changes in vision. Pt reports progressive worsening of vision over several months.   Recently pt has been started on lasix and aldactone due to abdominal distension with ascites noted on [place].  Review of Systems:  Review of Systems  Constitutional: Negative for chills and fever.  HENT: Negative for congestion and tinnitus.   Respiratory: Negative for cough, chest tightness, shortness of breath and wheezing.   Cardiovascular: Negative for chest pain, palpitations and leg swelling.  Gastrointestinal: Positive for abdominal distention. Negative for abdominal pain, constipation, diarrhea, nausea and vomiting.  Genitourinary: Negative for dysuria, frequency and urgency.  Musculoskeletal: Negative for arthralgias, back pain and myalgias.  Skin: Positive for wound (left toe amputation).  Neurological: Negative for dizziness and headaches.       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    Past Medical History:  Diagnosis Date  . CAD (coronary artery  disease)    01/15/16 he states he has had a heart attack  . CVA (cerebral infarction)   . DM (diabetes mellitus), type 2 with peripheral vascular complications (HCC)   . History of hepatitis C 1990s   Hep B immune  . Peripheral vascular disease [facility])    Past Surgical History:  Procedure Laterality Date  . AMPUTATION Right 01/07/2016   Procedure: AMPUTATION ABOVE KNEE;  Surgeon: Larina Earthly, MD;  [facility] OR;  Service: Vascular;  Laterality: Right;  . AMPUTATION Left 04/04/2016   Procedure: LEFT FOURTH AND FIFTH  TOE AMPUTATION;  Surgeon: Larina Earthly, MD;  Location: [facility] OR;  Service: Vascular;  Laterality: Left;  . BELOW KNEE LEG AMPUTATION Right   . CORONARY ARTERY BYPASS GRAFT  2005  . ESOPHAGOGASTRODUODENOSCOPY N/A 05/17/2016   Procedure: ESOPHAGOGASTRODUODENOSCOPY (EGD);  Surgeon: Meryl Dare, MD;  Location: [facility] ENDOSCOPY;  Service: Endoscopy;  Laterality: N/A;  . PERIPHERAL VASCULAR CATHETERIZATION N/A 01/11/2016   Procedure: Lower Extremity Angiography;  Surgeon: Nada Libman, MD;  Location: [facility] INVASIVE CV LAB;  Service: Cardiovascular;  Laterality: N/A;  . STERNOTOMY     Social History:   reports that he has been smoking Cigarettes.  He has a 5.40 pack-year smoking history. He has never used smokeless tobacco. He reports that he drinks alcohol. He reports that he uses drugs, including Cocaine and Marijuana.  Family History  Problem Relation Age of Onset  . Hypertension Other   . Diabetes Other   . Mental illness Other   . Lupus Other   . Diabetes Father   .  Cancer Neg Hx   . Heart disease Neg Hx   . Stroke Neg Hx     Medications: Patient's Medications  New Prescriptions   No medications on file  Previous Medications   ACETAMINOPHEN (TYLENOL) 325 MG TABLET    Take 650 mg by mouth every 6 (six) hours.    ATORVASTATIN (LIPITOR) 20 MG TABLET    Take 20 mg by mouth daily.   BARRIER CREAM (NON-SPECIFIED) CREA    Apply 1 application topically 2 (two) times daily.    CARVEDILOL (COREG) 6.25 MG TABLET    Take 6.25 mg by mouth 2 (two) times daily with a meal.   DOCUSATE SODIUM (COLACE) 100 MG CAPSULE    Take 100 mg by mouth 2 (two) times daily.   DULOXETINE (CYMBALTA) 30 MG CAPSULE    Take 30 mg by mouth daily.   FERROUS SULFATE 325 (65 FE) MG TABLET    Take 325 mg by mouth 2 (two) times daily with a meal.   FUROSEMIDE (LASIX) 40 MG TABLET    Take 40 mg by mouth daily.    HYDRALAZINE (APRESOLINE) 25 MG TABLET    Take 25 mg by mouth 3 (three) times daily.   INSULIN GLARGINE (LANTUS) 100 UNIT/ML SOPN    Inject 17 Units into the skin at bedtime.   IPRATROPIUM-ALBUTEROL (DUONEB) 0.5-2.5 (3) MG/3ML SOLN    Take 3 mLs by nebulization every 6 (six) hours as needed (shortness of breath).    ISOSORBIDE MONONITRATE (IMDUR) 30 MG 24 HR TABLET    Take 1 tablet (30 mg total) by mouth daily.   LEVOTHYROXINE (SYNTHROID, LEVOTHROID) 25 MCG TABLET    Take 25 mcg by mouth daily before breakfast.   NITROGLYCERIN (NITROSTAT) 0.4 MG SL TABLET    Place 0.4 mg under the tongue every 5 (five) minutes as needed for chest pain.   OXYGEN    Inhale into the lungs. 2lpm via [place] prn for shortness of breath or O2 sat <90%   PANTOPRAZOLE (PROTONIX) 40 MG TABLET    Take 1 tablet (40 mg total) by mouth daily.   SPIRONOLACTONE (ALDACTONE) 25 MG TABLET    Take 25 mg by mouth daily.   WARFARIN (COUMADIN) 5 MG TABLET    Give 5 mg by mouth every Mon and Fri   WARFARIN (COUMADIN) 6 MG TABLET    Give 6 mg by mouth every Tues, Wed, Thurs, Sat, and Sun  Modified Medications   No medications on file  Discontinued Medications   No medications on file     Physical Exam: Vitals:   07/02/16 1420  BP: (!) 180/98  Pulse: 68  Resp: 20  Temp: 97.8 F (36.6 C)  Weight: 188 lb (85.3 kg)    Physical Exam  Constitutional: He is oriented to person, place, and time. He appears well-developed and well-nourished. No distress.  HENT:  Head: Normocephalic and atraumatic.  Mouth/Throat: Oropharynx is clear  and moist. No oropharyngeal exudate.  Eyes: Conjunctivae and EOM are normal. Pupils are equal, round, and reactive to light.  Neck: Normal range of motion. Neck supple.  Cardiovascular: Normal rate, regular rhythm and normal heart sounds.   Pulmonary/Chest: Effort normal and breath sounds normal.  Abdominal: Soft. Bowel sounds are normal. He exhibits distension and ascites. He exhibits no abdominal bruit. There is no hepatosplenomegaly. There is no tenderness. There is no rigidity and no guarding.  Right sided abdominal firmness  Musculoskeletal: He exhibits no edema or tenderness.  Right AKA  Neurological:  He is alert and oriented to person, place, and time.  Skin: Skin is warm and dry. He is not diaphoretic.  Dressing to left foot, after 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 amputation  Psychiatric: He has a normal mood and affect.    Labs reviewed: Basic Metabolic Panel:  Recent Labs  40/98/11 1440 05/15/16 1521 05/16/16 0219 05/18/16 1111 06/13/16 06/26/16  NA 140 141 138 135 134* 139  K 4.1 4.0 3.9 4.5 4.4 5.0  CL 107 104 105 103  --   --   CO2 27  --  26 25  --   --   GLUCOSE 110* 105* 214* 146*  --   --   BUN 35* 36* 35* 28* 27* 32*  CREATININE 1.08 1.10 1.12 1.24 1.3 1.5*  CALCIUM 8.2*  --  7.9* 8.1*  --   --    Liver Function Tests:  Recent Labs  04/02/16 1046 05/15/16 1440 05/18/16 1111 06/13/16  AST 16 16 18 26   ALT 12* 9* 10* 14  ALKPHOS 119 109 93 198*  BILITOT 1.0 0.6 0.5  --   PROT 6.8 6.5 6.4*  --   ALBUMIN 2.7* 2.2* 2.2*  --    No results for input(s): LIPASE, AMYLASE in the last 8760 hours.  Recent Labs  04/02/16 1121 05/18/16 1111  AMMONIA 53* 31   CBC:  Recent Labs  12/26/15 1908  04/02/16 1046  05/15/16 1440  05/17/16 2343 05/20/16 1032 05/21/16 0743 06/02/16 06/13/16 06/26/16  WBC 26.1*  < > 12.9*  < > 6.6  < > 5.2 4.9 6.0 6.2 5.3 4.8  NEUTROABS 23.2*  --  9.7*  --  4.0  --   --   --   --   --   --   --   HGB 12.2*  < > 9.1*  < > 8.2*  < > 7.9* 8.3*  8.2* 9.5* 8.9* 9.4*  HCT 36.4*  < > 28.9*  < > 26.6*  < > 25.5* 27.3* 26.6* 31* 28* 31*  MCV 89.4  < > 93.8  < > 86.9  < > 86.7 88.3 87.8  --   --   --   PLT 337  < > 185  < > 230  < > 217 221 229 263 235 163  < > = values in this interval not displayed. TSH:  Recent Labs  03/03/16  TSH 4.44   A1C: Lab Results  Component Value Date   HGBA1C 6.5 (H) 04/02/2016   Lipid Panel:  Recent Labs  06/09/16 06/11/16  CHOL 92 88  HDL 38 42  LDLCALC 38 30  TRIG 81 84    Lab Results  Component Value Date   INR 4.1 (A) 06/30/2016   INR 1.7 (A) 06/13/2016   INR 1.8 (A) 06/11/2016   PROTIME 40.1 (A) 06/30/2016   PROTIME 19.6 (A) 06/13/2016   PROTIME 23.8 (A) 06/06/2016     Assessment/Plan 1. Benign essential HTN Currently taking coreg 6.25 mg BID, loasix 40 mg daily, imdur 30 g daily, aldactone 25 mg daily and hydralazine 25 mg TID for blood pressure with increase in blood pressures in the last few weeks.  - 2 gm sodium diet -will increase hydralazine to 50 mg TID at this time.  -VS q shift   2. Blurred vision  Pt reports progressive worsening in vision over the last several months. Will have him placed on ophthalmologist list.     Janene Harvey. Biagio Borg  [facility]  [facility]ult Medicine [phone] 8 am - 5 pm) [phone] (after hours)

## 2016-07-03 ENCOUNTER — Encounter: Payer: Self-pay | Admitting: *Deleted

## 2016-07-03 LAB — BASIC METABOLIC PANEL
BUN: 30 mg/dL — AB (ref 4–21)
CREATININE: 1.5 mg/dL — AB (ref 0.6–1.3)
Glucose: 118 mg/dL
POTASSIUM: 5 mmol/L (ref 3.4–5.3)
SODIUM: 139 mmol/L (ref 137–147)

## 2016-07-03 LAB — HEMOGLOBIN A1C: Hemoglobin A1C: 6.8

## 2016-07-03 LAB — HEPATIC FUNCTION PANEL
ALK PHOS: 185 U/L — AB (ref 25–125)
ALT: 16 U/L (ref 10–40)
AST: 28 U/L (ref 14–40)
Bilirubin, Total: 0.7 mg/dL

## 2016-07-03 LAB — POCT INR: INR: 3.3 — AB (ref 0.9–1.1)

## 2016-07-03 LAB — PROTIME-INR: PROTIME: 33.4 s — AB (ref 10.0–13.8)

## 2016-07-04 ENCOUNTER — Ambulatory Visit (INDEPENDENT_AMBULATORY_CARE_PROVIDER_SITE_OTHER): Payer: Medicare Other | Admitting: Physician Assistant

## 2016-07-04 ENCOUNTER — Encounter: Payer: Self-pay | Admitting: Physician Assistant

## 2016-07-04 VITALS — BP 140/80 | HR 88

## 2016-07-04 DIAGNOSIS — I25810 Atherosclerosis of coronary artery bypass graft(s) without angina pectoris: Secondary | ICD-10-CM

## 2016-07-04 DIAGNOSIS — Z8719 Personal history of other diseases of the digestive system: Secondary | ICD-10-CM | POA: Diagnosis not present

## 2016-07-04 DIAGNOSIS — R188 Other ascites: Secondary | ICD-10-CM | POA: Diagnosis not present

## 2016-07-04 NOTE — Progress Notes (Signed)
Reviewed and agree with initial management plan.  Jerel Sardina T. Dmiya Malphrus, MD FACG 

## 2016-07-04 NOTE — Progress Notes (Signed)
Chief Complaint: Hospital follow up for ascites and GI bleed  HPI:  Ronald Simpson is a 71 year old African-American male with multiple medical problems including CAD, CVA, diabetes, history of hep C, peripheral vascular disease and status post AKA of the right leg who was referred to me by [name], MD for follow-up after recently being seen in the hospital for GI bleed and ascites.     Per chart review patient was recently consulted in the hospital on 05/16/16 for dark and FOBT positive stools as well as anemia. 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 Ultrasound of his liver was done. He was initially thought to have decompensated cirrhosis likely related to his hep C with large volume ascites and possible hepatic encephalopathy and a paracentesis was ordered. They drew off 1.8 L of fluid with the cell count which did not show SBP, total protein 3.9, SAAG 0.6, this was suggestive that the ascites was not due to cirrhosis, this pointed more towards renal or cardiac causes per Dr. Christella Hartigan. Patient also had an EGD with no clear source of GI bleed but wished to be discharged from the hospital and declined further imaging or colonoscopy. He was placed on a PPI.   Today, the patient returns to clinic from his nursing home, alone. He is a poor historian. He tells me that over the past 2 months he has continued with abdominal distention which gets "quite tight" at times. Apparently this is down somewhat over the past 2 days but remains tight on the right side of his abdomen. There is an ultrasound in his file for March 22 which does show moderate amount of ascites. Patient is currently on spironolactone 25 mg daily and Lasix 40 mg daily. Patient tells me that he has a lot of urine on a daily basis and is passing daily stools. Associated symptoms include a feeling of shortness of breath.    He has seen no further bleeding and "either have the nurses who check me  all the time". His recent labs shows hemoglobin is improving now 9.4 on 06/26/16 compared to 8.2 at time of discharge.   Patient denies fever, chills, change in bowel habits, weight loss, heartburn, reflux or abdominal pain.    Past Medical History:  Diagnosis Date  . CAD (coronary artery disease)    01/15/16 he states he has had a heart attack  . CVA (cerebral infarction)   . DM (diabetes mellitus), type 2 with peripheral vascular complications (HCC)   . History of hepatitis C 1990s   Hep B immune  . Peripheral vascular disease [facility])     Past Surgical History:  Procedure Laterality Date  . AMPUTATION Right 01/07/2016   Procedure: AMPUTATION ABOVE KNEE;  Surgeon: Larina Earthly, MD;  Location: [facility] OR;  Service: Vascular;  Laterality: Right;  . AMPUTATION Left 04/04/2016   Procedure: LEFT FOURTH AND FIFTH  TOE AMPUTATION;  Surgeon: Larina Earthly, MD;  Location: [facility] OR;  Service: Vascular;  Laterality: Left;  . BELOW KNEE LEG AMPUTATION Right   . CORONARY ARTERY BYPASS GRAFT  2005  . ESOPHAGOGASTRODUODENOSCOPY N/A 05/17/2016   Procedure: ESOPHAGOGASTRODUODENOSCOPY (EGD);  Surgeon: Meryl Dare, MD;  Location: [facility] ENDOSCOPY;  Service: Endoscopy;  Laterality: N/A;  . PERIPHERAL VASCULAR CATHETERIZATION N/A 01/11/2016   Procedure: Lower Extremity Angiography;  Surgeon: Nada Libman, MD;  Location: [facility] INVASIVE CV LAB;  Service: Cardiovascular;  Laterality: N/A;  .  STERNOTOMY      Current Outpatient Prescriptions  Medication Sig Dispense Refill  . acetaminophen (TYLENOL) 325 MG tablet Take 650 mg by mouth every 6 (six) hours as needed.    Marland Kitchen acetaminophen (TYLENOL) 500 MG tablet Take 500 mg by mouth every 6 (six) hours as needed.    Marland Kitchen atorvastatin (LIPITOR) 20 MG tablet Take 20 mg by mouth daily.    . barrier cream (NON-SPECIFIED) CREA Apply 1 application topically 2 (two) times daily.    . carvedilol (COREG) 6.25 MG tablet Take 6.25 mg by mouth 2 (two) times daily with a meal.    . docusate  sodium (COLACE) 100 MG capsule Take 100 mg by mouth 2 (two) times daily.    . DULoxetine (CYMBALTA) 30 MG capsule Take 30 mg by mouth daily.    . ferrous sulfate 325 (65 FE) MG tablet Take 325 mg by mouth 2 (two) times daily with a meal.    . furosemide (LASIX) 40 MG tablet Take 40 mg by mouth daily.     . hydrALAZINE (APRESOLINE) 25 MG tablet Take 25 mg by mouth 3 (three) times daily.    . insulin glargine (LANTUS) 100 unit/mL SOPN Inject 17 Units into the skin at bedtime.    Marland Kitchen ipratropium-albuterol (DUONEB) 0.5-2.5 (3) MG/3ML SOLN Take 3 mLs by nebulization every 6 (six) hours as needed (shortness of breath).     . isosorbide mononitrate (IMDUR) 30 MG 24 hr tablet Take 1 tablet (30 mg total) by mouth daily. 30 tablet 1  . levothyroxine (SYNTHROID, LEVOTHROID) 25 MCG tablet Take 25 mcg by mouth daily before breakfast.    . nitroGLYCERIN (NITROSTAT) 0.4 MG SL tablet Place 0.4 mg under the tongue every 5 (five) minutes as needed for chest pain.    . OXYGEN Inhale into the lungs. 2lpm via [place] prn for shortness of breath or O2 sat <90%    . pantoprazole (PROTONIX) 40 MG tablet Take 1 tablet (40 mg total) by mouth daily. 30 tablet 3  . spironolactone (ALDACTONE) 25 MG tablet Take 25 mg by mouth daily.    Marland Kitchen warfarin (COUMADIN) 2.5 MG tablet Take 2.5 mg by mouth as directed. On Tuesday and Thursday    . warfarin (COUMADIN) 5 MG tablet Give 5 mg by mouth every Mon, Wednesday, Fri, Saturday and Sunday     No current facility-administered medications for this visit.     Allergies as of 07/04/2016 - Review Complete 07/04/2016  Allergen Reaction Noted  . Percocet [oxycodone-acetaminophen]  05/12/2016  . Tramadol  05/23/2016    Family History  Problem Relation Age of Onset  . Hypertension Other   . Diabetes Other   . Mental illness Other   . Lupus Other   . Diabetes Father   . Cancer Neg Hx   . Heart disease Neg Hx   . Stroke Neg Hx     Social History   Social History  . Marital status:  Widowed    Spouse name: N/A  . Number of children: N/A  . Years of education: N/A   Occupational History  . Not on file.   Social History Main Topics  . Smoking status: Current Every Day Smoker    Packs/day: 0.10    Years: 54.00    Types: Cigarettes  . Smokeless tobacco: Never Used  . Alcohol use Yes     Comment: 04/02/2016 "aint suppose to drink in the nursing home"  . Drug use: Yes    Types: Cocaine,  Marijuana     Comment: marijuana use daily, rare cocain use.  Previously did snort and inject drugs through 2017. Hasn't had access to street drugs from 10/201 7 through February/201 8  . Sexual activity: Not Currently   Other Topics Concern  . Not on file   Social History Narrative   As of fall/2017 patient had been living in [place] but then entered SNF in [place]. His wife passed away in summer 2017.    Review of Systems:    Constitutional: No weight loss, fever or chills Skin: No rash Cardiovascular: No chest pain Respiratory: Positive for SOB Gastrointestinal: See HPI and otherwise negative Genitourinary: No dysuria or change in urinary frequency Neurological: No headache Musculoskeletal: No new muscle or joint pain Hematologic: No bleeding  Psychiatric: No history of depression or anxiety   Physical Exam:  Vital signs: BP 140/80   Pulse 88    Constitutional:   Pleasant African American male appears to be in NAD, Well developed, Well nourished, alert and cooperative Respiratory: Respirations even and unlabored. Lungs clear to auscultation bilaterally.   No wheezes, crackles, or rhonchi.  Cardiovascular: Normal S1, S2. No MRG. Regular rate and rhythm. No peripheral edema, cyanosis or pallor.  Gastrointestinal:  Soft, moderate distension, possible anasarca along right side of abdomen with tight skin, nontender. No rebound or guarding. Normal bowel sounds. No appreciable masses or hepatomegaly. Psychiatric:  Demonstrates good judgement and reason without abnormal  affect or behaviors.  RECENT LABS AND IMAGING: CBC    Component Value Date/Time   WBC 4.8 06/26/2016   WBC 6.0 05/21/2016 0743   RBC 3.03 (L) 05/21/2016 0743   HGB 9.4 (A) 06/26/2016   HCT 31 (A) 06/26/2016   PLT 163 06/26/2016   MCV 87.8 05/21/2016 0743   MCH 27.1 05/21/2016 0743   MCHC 30.8 05/21/2016 0743   RDW 16.2 (H) 05/21/2016 0743   LYMPHSABS 1.5 05/15/2016 1440   MONOABS 0.8 05/15/2016 1440   EOSABS 0.3 05/15/2016 1440   BASOSABS 0.0 05/15/2016 1440    CMP     Component Value Date/Time   NA 139 07/03/2016   K 5.0 07/03/2016   CL 103 05/18/2016 1111   CO2 25 05/18/2016 1111   GLUCOSE 146 (H) 05/18/2016 1111   BUN 30 (A) 07/03/2016   CREATININE 1.5 (A) 07/03/2016   CREATININE 1.24 05/18/2016 1111   CALCIUM 8.1 (L) 05/18/2016 1111   PROT 6.4 (L) 05/18/2016 1111   ALBUMIN 2.2 (L) 05/18/2016 1111   AST 28 07/03/2016   ALT 16 07/03/2016   ALKPHOS 185 (A) 07/03/2016   BILITOT 0.5 05/18/2016 1111   GFRNONAA 58 (L) 05/18/2016 1111   GFRAA >60 05/18/2016 1111    Assessment: 1. Ascites: The patient is distended at time of exam today with question of anasarca, currently on Aldactone 25 mg daily and Lasix 40 mg daily, again in the hospital ascites was thought to be from non-liver source, would recommend he follow with cardiology or nephrology in the future for ascites management 2. History of a GI bleed: Hemoglobin appears stable at the moment, no signs of acute GI bleed from a nursing home  Plan: 1. Again, during time of hospitalization ascitic fluid had a SAAG of 0.6 indicating likely this fluid was not from the patient's liver, but more likely a cardiac or liver origin. This is still likely the case, though patient is uncomfortable at time of my exam and I will order an ultrasound paracentesis with fluid drawn for analysis with  albumin, protein, cell count and differential as well as cytology. If this continues to show non-liver origin, would recommend the patient  follow with cardiology or nephrology in the future for any fluid concerns. 2. Continue Aldactone 25 mg daily and Lasix 40 mg daily for now. This may be need to be adjusted by either cardiac or renal specialist in the future as above 3. Patient's hemoglobin appears to be improving, would recommend he continue his Protonix 40 mg daily 4. Patient to follow in clinic with [place] as needed, either Dr. Russella Dar or myself.  Ronald Meeker, PA-C [facility] 07/04/2016, 12:30 PM  Cc: [name], MD

## 2016-07-07 ENCOUNTER — Other Ambulatory Visit: Payer: Self-pay | Admitting: Physician Assistant

## 2016-07-07 ENCOUNTER — Ambulatory Visit (HOSPITAL_COMMUNITY)
Admission: RE | Admit: 2016-07-07 | Discharge: 2016-07-07 | Disposition: A | Discharge status: Home / Self Care | Payer: Medicare Other | Source: Ambulatory Visit | Attending: Physician Assistant | Admitting: Physician Assistant

## 2016-07-07 ENCOUNTER — Encounter (HOSPITAL_COMMUNITY): Payer: Self-pay | Admitting: Physician Assistant

## 2016-07-07 DIAGNOSIS — B182 Chronic viral hepatitis C: Secondary | ICD-10-CM | POA: Diagnosis not present

## 2016-07-07 DIAGNOSIS — R188 Other ascites: Secondary | ICD-10-CM | POA: Insufficient documentation

## 2016-07-07 DIAGNOSIS — Z8719 Personal history of other diseases of the digestive system: Secondary | ICD-10-CM

## 2016-07-07 HISTORY — PX: IR GENERIC HISTORICAL: IMG1180011

## 2016-07-07 LAB — BODY FLUID CELL COUNT WITH DIFFERENTIAL
Eos, Fluid: 0 %
Lymphs, Fluid: 38 %
Monocyte-Macrophage-Serous Fluid: 57 % (ref 50–90)
Neutrophil Count, Fluid: 5 % (ref 0–25)
OTHER CELLS FL: 0 %
Total Nucleated Cell Count, Fluid: 250 cu mm (ref 0–1000)

## 2016-07-07 LAB — PROTEIN, PLEURAL OR PERITONEAL FLUID: TOTAL PROTEIN, FLUID: 5.1 g/dL

## 2016-07-07 LAB — ALBUMIN, PLEURAL OR PERITONEAL FLUID: ALBUMIN FL: 2.4 g/dL

## 2016-07-07 MED ORDER — LIDOCAINE HCL 1 % IJ SOLN
INTRAMUSCULAR | Status: AC
  Filled 2016-07-07: qty 20

## 2016-07-07 NOTE — Procedures (Signed)
PROCEDURE SUMMARY:  Successful US guided paracentesis from right lateral abdomen.  Yielded 960 mL of clear yellow fluid.  No immediate complications.  Pt tolerated well.   Specimen was sent for labs.  Sloan Takagi S Trooper Olander PA-C 07/07/2016 2:27 PM

## 2016-07-09 ENCOUNTER — Other Ambulatory Visit: Payer: Self-pay

## 2016-07-09 ENCOUNTER — Encounter: Payer: Self-pay | Admitting: Vascular Surgery

## 2016-07-09 DIAGNOSIS — R188 Other ascites: Secondary | ICD-10-CM

## 2016-07-10 LAB — PROTIME-INR: Protime: 31.5 seconds — AB (ref 10.0–13.8)

## 2016-07-10 LAB — HEPATIC FUNCTION PANEL
ALK PHOS: 120 U/L (ref 25–125)
ALT: 9 U/L — AB (ref 10–40)
AST: 17 U/L (ref 14–40)
Bilirubin, Total: 0.7 mg/dL

## 2016-07-10 LAB — BASIC METABOLIC PANEL
BUN: 50 mg/dL — AB (ref 4–21)
CREATININE: 3.3 mg/dL — AB (ref 0.6–1.3)
Glucose: 168 mg/dL
POTASSIUM: 6.4 mmol/L — AB (ref 3.4–5.3)
SODIUM: 136 mmol/L — AB (ref 137–147)

## 2016-07-10 LAB — POCT INR: INR: 3.1 — AB (ref 0.9–1.1)

## 2016-07-13 LAB — BASIC METABOLIC PANEL
BUN: 58 mg/dL — AB (ref 4–21)
CREATININE: 2.3 mg/dL — AB (ref 0.6–1.3)
Glucose: 182 mg/dL
POTASSIUM: 4.6 mmol/L (ref 3.4–5.3)
Sodium: 132 mmol/L — AB (ref 137–147)

## 2016-07-14 ENCOUNTER — Non-Acute Institutional Stay (SKILLED_NURSING_FACILITY): Payer: Medicare Other | Admitting: Nurse Practitioner

## 2016-07-14 ENCOUNTER — Encounter: Payer: Self-pay | Admitting: Nurse Practitioner

## 2016-07-14 DIAGNOSIS — N179 Acute kidney failure, unspecified: Secondary | ICD-10-CM | POA: Diagnosis not present

## 2016-07-14 DIAGNOSIS — E875 Hyperkalemia: Secondary | ICD-10-CM

## 2016-07-14 DIAGNOSIS — R188 Other ascites: Secondary | ICD-10-CM

## 2016-07-14 NOTE — Progress Notes (Signed)
Nursing Home Location:  [facility] Room number: 113 A  Place of Service: SNF (31)  PCP: Marga Melnick, MD  Allergies  Allergen Reactions  . Percocet [Oxycodone-Acetaminophen]     Needing narcan on multiple occasions due to oversedation after getting percocet   . Tramadol     Excess sedation; ? Related to pre-existing hepatic dysfunction with PMH Hep C    Chief Complaint  Patient presents with  . Acute Visit    Resident is being seen due to abnormal lab results.     HPI:  Patient is a 71 y.o. male seen today at [facility] for follow up on AKI and hyperkalemia. Pt with hx of CVA, hep b and C, PVD, AKA, CAD, DM.  Pt was found to have acute kidney injury with hyperkalemia on 07/10/16. Orders were given to D/C spironolactone and orders given for Kayexalate; repeat labs showed slightly  improvement in renal function and potasium but not back to baseline, pt received 500 cc NS due to decrease PO intake.  Pt reports he is eating and drinking better now. Overall feeling better.  Pt also recently had paracentesis by IR with 960 cc of clear yellow fluid removed and is being followed up by GI. Pt reports shortness of breath had improved after paracentesis.    Review of Systems:  Review of Systems  Constitutional: Negative for chills and fever.  HENT: Negative for congestion and tinnitus.   Respiratory: Negative for cough, chest tightness, shortness of breath and wheezing.   Cardiovascular: Negative for chest pain, palpitations and leg swelling.  Gastrointestinal: Positive for abdominal distention (has improved). Negative for abdominal pain, constipation, diarrhea, nausea and vomiting.  Genitourinary: Negative for dysuria, frequency and urgency.  Psychiatric/Behavioral: Negative for behavioral problems.    Past Medical History:  Diagnosis Date  . CAD (coronary artery disease)    01/15/16 he states he has had a heart attack  . CVA (cerebral infarction)   . DM (diabetes  mellitus), type 2 with peripheral vascular complications (HCC)   . History of hepatitis C 1990s   Hep B immune  . Peripheral vascular disease [facility])    Past Surgical History:  Procedure Laterality Date  . AMPUTATION Right 01/07/2016   Procedure: AMPUTATION ABOVE KNEE;  Surgeon: Larina Earthly, MD;  Location: [facility] OR;  Service: Vascular;  Laterality: Right;  . AMPUTATION Left 04/04/2016   Procedure: LEFT FOURTH AND FIFTH  TOE AMPUTATION;  Surgeon: Larina Earthly, MD;  Location[facility];  Service: Vascular;  Laterality: Left;  . BELOW KNEE LEG AMPUTATION Right   . CORONARY ARTERY BYPASS GRAFT  2005  . ESOPHAGOGASTRODUODENOSCOPY N/A 05/17/2016   Procedure: ESOPHAGOGASTRODUODENOSCOPY (EGD);  Surgeon: Meryl Dare, MD;  Location: [facility] ENDOSCOPY;  Service: Endoscopy;  Laterality: N/A;  . IR GENERIC HISTORICAL  07/07/2016   IR PARACENTESIS 07/07/2016 Gershon Crane, PA-C [facility]-INTERV RAD  . PERIPHERAL VASCULAR CATHETERIZATION N/A 01/11/2016   Procedure: Lower Extremity Angiography;  Surgeon: Nada Libman, MD;  Location[facility] INVASIVE CV LAB;  Service: Cardiovascular;  Laterality: N/A;  . STERNOTOMY     Social History:   reports that he has been smoking Cigarettes.  He has a 5.40 pack-year smoking history. He has never used smokeless tobacco. He reports that he drinks alcohol. He reports that he uses drugs, including Cocaine and Marijuana.  Family History  Problem Relation Age of Onset  . Hypertension Other   . Diabetes Other   . Mental illness Other   .  Lupus Other   . Diabetes Father   . Cancer Neg Hx   . Heart disease Neg Hx   . Stroke Neg Hx     Medications: Patient's Medications  New Prescriptions   No medications on file  Previous Medications   ACETAMINOPHEN (TYLENOL) 325 MG TABLET    Take 650 mg by mouth every 6 (six) hours as needed.   ACETAMINOPHEN (TYLENOL) 500 MG TABLET    Take 500 mg by mouth every 6 (six) hours as needed.   ATORVASTATIN (LIPITOR) 20 MG TABLET    Take 20 mg by  mouth daily.   BARRIER CREAM (NON-SPECIFIED) CREA    Apply 1 application topically 2 (two) times daily.   CARVEDILOL (COREG) 6.25 MG TABLET    Take 6.25 mg by mouth 2 (two) times daily with a meal.   DOCUSATE SODIUM (COLACE) 100 MG CAPSULE    Take 100 mg by mouth 2 (two) times daily.   DULOXETINE (CYMBALTA) 30 MG CAPSULE    Take 30 mg by mouth daily.   FERROUS SULFATE 325 (65 FE) MG TABLET    Take 325 mg by mouth 2 (two) times daily with a meal.   FUROSEMIDE (LASIX) 40 MG TABLET    Take 40 mg by mouth daily.    HYDRALAZINE (APRESOLINE) 25 MG TABLET    Take 25 mg by mouth 3 (three) times daily.   INSULIN GLARGINE (LANTUS) 100 UNIT/ML SOPN    Inject 17 Units into the skin at bedtime.   IPRATROPIUM-ALBUTEROL (DUONEB) 0.5-2.5 (3) MG/3ML SOLN    Take 3 mLs by nebulization every 6 (six) hours as needed (shortness of breath).    ISOSORBIDE MONONITRATE (IMDUR) 30 MG 24 HR TABLET    Take 1 tablet (30 mg total) by mouth daily.   LEVOTHYROXINE (SYNTHROID, LEVOTHROID) 25 MCG TABLET    Take 25 mcg by mouth daily before breakfast.   NITROGLYCERIN (NITROSTAT) 0.4 MG SL TABLET    Place 0.4 mg under the tongue every 5 (five) minutes as needed for chest pain.   OXYGEN    Inhale into the lungs. 2lpm via [place] prn for shortness of breath or O2 sat <90%   PANTOPRAZOLE (PROTONIX) 40 MG TABLET    Take 1 tablet (40 mg total) by mouth daily.   SPIRONOLACTONE (ALDACTONE) 25 MG TABLET    Take 25 mg by mouth daily.   WARFARIN (COUMADIN) 2.5 MG TABLET    Take 2.5 mg by mouth as directed. On Tuesday and Thursday   WARFARIN (COUMADIN) 5 MG TABLET    Give 5 mg by mouth every Mon, Wednesday, Fri, Saturday and Sunday  Modified Medications   No medications on file  Discontinued Medications   No medications on file     Physical Exam: Vitals:   07/14/16 1058  BP: 124/75  Pulse: 80  Resp: 20  Temp: 98.1 F (36.7 C)  SpO2: 97%  Weight: 179 lb (81.2 kg)  Height:  (1.778 m)    Physical Exam  Constitutional: He is  oriented to person, place, and time. He appears well-developed and well-nourished. No distress.  HENT:  Head: Normocephalic and atraumatic.  Mouth/Throat: Oropharynx is clear and moist. No oropharyngeal exudate.  Eyes: Conjunctivae and EOM are normal. Pupils are equal, round, and reactive to light.  Neck: Normal range of motion. Neck supple.  Cardiovascular: Normal rate, regular rhythm and normal heart sounds.   Pulmonary/Chest: Effort normal and breath sounds normal.  Abdominal: Soft. Bowel sounds are normal. He  exhibits distension and ascites. He exhibits no abdominal bruit. There is no hepatosplenomegaly. There is no tenderness. There is no rigidity and no guarding.  Right sided abdominal firmness  Musculoskeletal: He exhibits no edema or tenderness.  Right AKA  Neurological: He is alert and oriented to person, place, and time.  Skin: Skin is warm and dry. He is not diaphoretic.  Dressing to left foot, after 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 amputation  Psychiatric: He has a normal mood and affect.    Labs reviewed: Basic Metabolic Panel:  Recent Labs  16/10/96 1440 05/15/16 1521 05/16/16 0219 05/18/16 1111  07/03/16 07/10/16 07/13/16  NA 140 141 138 135  < > 139 136* 132*  K 4.1 4.0 3.9 4.5  < > 5.0 6.4* 4.6  CL 107 104 105 103  --   --   --   --   CO2 27  --  26 25  --   --   --   --   GLUCOSE 110* 105* 214* 146*  --   --   --   --   BUN 35* 36* 35* 28*  < > 30* 50* 58*  CREATININE 1.08 1.10 1.12 1.24  < > 1.5* 3.3* 2.3*  CALCIUM 8.2*  --  7.9* 8.1*  --   --   --   --   < > = values in this interval not displayed. Liver Function Tests:  Recent Labs  04/02/16 1046 05/15/16 1440 05/18/16 1111 06/13/16 07/03/16 07/10/16  AST ALT 12* 9* 10* 14 16 9*  ALKPHOS 119 109 93 198* 185* 120  BILITOT 1.0 0.6 0.5  --   --   --   PROT 6.8 6.5 6.4*  --   --   --   ALBUMIN 2.7* 2.2* 2.2*  --   --   --    No results for input(s): LIPASE, AMYLASE in the last 8760 hours.  Recent  Labs  04/02/16 1121 05/18/16 1111  AMMONIA 53* 31   CBC:  Recent Labs  12/26/15 1908  04/02/16 1046  05/15/16 1440  05/17/16 2343 05/20/16 1032 05/21/16 0743 06/02/16 06/13/16 06/26/16  WBC 26.1*  < > 12.9*  < > 6.6  < > 5.2 4.9 6.0 6.2 5.3 4.8  NEUTROABS 23.2*  --  9.7*  --  4.0  --   --   --   --   --   --   --   HGB 12.2*  < > 9.1*  < > 8.2*  < > 7.9* 8.3* 8.2* 9.5* 8.9* 9.4*  HCT 36.4*  < > 28.9*  < > 26.6*  < > 25.5* 27.3* 26.6* 31* 28* 31*  MCV 89.4  < > 93.8  < > 86.9  < > 86.7 88.3 87.8  --   --   --   PLT 337  < > 185  < > 230  < > 217 221 229 263 235 163  < > = values in this interval not displayed. TSH:  Recent Labs  03/03/16  TSH 4.44   A1C: Lab Results  Component Value Date   HGBA1C 6.8 07/03/2016   Lipid Panel:  Recent Labs  06/09/16 06/11/16  CHOL 92 88  HDL 38 42  LDLCALC 38 30  TRIG 81 84    Lab Results  Component Value Date   INR 3.1 (A) 07/10/2016   INR 3.3 (A) 07/03/2016   INR 4.1 (A) 06/30/2016  PROTIME 31.5 (A) 07/10/2016   PROTIME 33.4 (A) 07/03/2016   PROTIME 40.1 (A) 06/30/2016     Assessment/Plan 1. Other ascites 960 cc drained by IR. GI does not feel like this is due to cirrhosis and due to non-liver source. Fluid sent for analysis.   2. Hyperkalemia Has improved with kayexalate.  spironolactone has been Dc'd  3. AKI (acute kidney injury) (HCC) BUN/Cr are improving. Pt with increase PO intake. Will follow up BMP in AM   Gregg Holster K. Biagio Borg  [facility] [phone] 8 am - 5 pm) [phone] (after hours)

## 2016-07-15 LAB — URINALYSIS, MICROSCOPIC (REFLEX)

## 2016-07-15 LAB — POCT INR: INR: 7.2 — AB (ref 0.9–1.1)

## 2016-07-15 LAB — BASIC METABOLIC PANEL
BUN: 51 mg/dL — AB (ref 4–21)
CREATININE: 1.7 mg/dL — AB (ref 0.6–1.3)
Glucose: 123 mg/dL
Potassium: 4.5 mmol/L (ref 3.4–5.3)
Sodium: 133 mmol/L — AB (ref 137–147)

## 2016-07-15 LAB — PROTIME-INR: Protime: 62.4 seconds — AB (ref 10.0–13.8)

## 2016-07-17 ENCOUNTER — Non-Acute Institutional Stay (SKILLED_NURSING_FACILITY): Payer: Medicare Other | Admitting: Internal Medicine

## 2016-07-17 ENCOUNTER — Encounter: Payer: Self-pay | Admitting: Internal Medicine

## 2016-07-17 DIAGNOSIS — R791 Abnormal coagulation profile: Secondary | ICD-10-CM | POA: Diagnosis not present

## 2016-07-17 DIAGNOSIS — R634 Abnormal weight loss: Secondary | ICD-10-CM

## 2016-07-17 DIAGNOSIS — R630 Anorexia: Secondary | ICD-10-CM

## 2016-07-17 DIAGNOSIS — R188 Other ascites: Secondary | ICD-10-CM | POA: Diagnosis not present

## 2016-07-17 NOTE — Progress Notes (Signed)
This is a nursing facility follow up for specific acute issue of excessively high PT/INR.  Interim medical record and care since last [facility] visit was updated with review of diagnostic studies and change in clinical status since last visit were documented.  HPI: PT/INR was 6.1 on 4/2 Coumadin was held and PT/INR ordered 3 consecutive days. On 4/3 the value was 7.2. The chart will need to be reviewed to verify that the Coumadin was held as instructed. This was questioned as PT/INR 4/4 was 7.6. This is critically important as this patient has had a severe life-threatening GI bleed 2/1-05/21/16.   Note:Verified with nursing that the medication has been held since 07/14/16.  Most recent Hgb is 9.4 with Hct 31 on 06/26/16.  He states that over the last 1.5 months his weight has dropped from 208 down to 176. This is in the context of poor appetite. Last TSH was 4.44 on 03/03/16. His renal insufficiency has improved as the creatinine dropped from a high of 3.3 on 3/29 down to a value of 1.7 on 4/3.  Med list was reviewed. He has Tylenol ordered as 325 mg 2 tablets every 6 hours as well as 500 mg every 6 hours as needed. This potentially could be 5600 mg daily. Atorvastatin dose was listed as 40 mg daily. He is on duloxetine which can increase PT/INR. AST was 17 & ALT 9 on 3/29. His diabetes is adequately controlled as evidenced by an A1c of 6.8% on 3/22.  Review of systems: Denies melena, hematuria, easy bruising, or epistaxis.  Nursing corroborates this.  Nursing reports last bowel movement two days ago, no melena.   Reports cough with small amount of brown sputum production.  Constitutional: No fever  Eyes: No redness, discharge, pain, vision change ENT/mouth: No nasal congestion,  purulent discharge, earache,change in hearing ,sore throat  Cardiovascular: No chest pain, palpitations,paroxysmal nocturnal dyspnea, claudication, edema  Respiratory: No cough, sputum  production,hemoptysis, DOE , significant snoring,apnea  Gastrointestinal: No heartburn,dysphagia,abdominal pain, nausea / vomiting,rectal bleeding, change in bowels Genitourinary: No dysuria, pyuria,  incontinence, nocturia Musculoskeletal: No joint stiffness, joint swelling, weakness,pain Dermatologic: No rash, pruritus, change in appearance of skin Neurologic: No dizziness,headache,syncope, seizures, numbness , tingling Psychiatric: No significant anxiety , depression, insomnia, anorexia Endocrine: No change in hair/skin/ nails, excessive thirst, excessive hunger, excessive urination  Hematologic/lymphatic: No lymphadenopathy Allergy/immunology: No itchy/ watery eyes, significant sneezing, urticaria, angioedema  Physical exam:  Pertinent or positive findings Alert and Oriented to self, place, and president.  Stated date as Thursday in March 2018.  Patient very drowsy, answers brief questions but falls asleep during extended sentences.  Bilateral nystagmus.  Globes are prominent without definite proptosis. Missing lower  teeth. Maxilla edentulous. Lungs diminished in bases.  Abdomen distended. Significant asymmetry to dullness to percussion over the abdomen. Marked dullness on the right > left. No definite organomegaly palpable. Bowel sounds hypoactive.  Right above the knee amputation.  Left lower extremity dry and flaking, ankle and foot wrapped in dressing. Hyperpigmented scarring over the left shin. Left foot surgically dressed. Large tatoo R chest.   General appearance:Adequately nourished; no acute distress , increased work of breathing is present.   Lymphatic: No lymphadenopathy about the head, neck, axilla . Eyes: No conjunctival inflammation or lid edema is present. There is no scleral icterus. Ears:  External ear exam shows no significant lesions or deformities.   Nose:  External nasal examination shows no deformity or inflammation. Nasal mucosa are pink and  moist without  lesions ,exudates Oral exam: lips and gums are healthy appearing.There is no oropharyngeal erythema or exudate . Neck:  No thyromegaly, masses, tenderness noted.    Heart:  Normal rate and regular rhythm. S1 and S2 normal without gallop, murmur, click, rub .  Lungs:Chest without wheezes, rhonchi,rales , rubs. Abdomen: Abdomen is nontender with no organomegaly, hernias,masses. GU: deferred  Extremities:  No cyanosis, clubbing,edema  Neurologic exam : Strength equal  in upper extremities Balance,Rhomberg,finger to nose testing could not be completed due to clinical state Skin: Warm & dry w/o tenting. No significant  rash.  See summary under each active problem in the Problem List with associated updated therapeutic plan   #1) Supratherapeutic INR- continue to hold coumadin.Decrease Acetaminophen dose.  Vitamin K as patient continuing on Cephalexin and INR continues to rise with increased risk of GIB.  INR and CBC 07/18/16.   #2) Somnolence- Medications reviewed.  No medications to account for somnolence.    #3 weight loss & anorexia  #4 abnormal abdominal exam with prior paracentesis of 1.8 L; R/O hepatocellar cancer in view of # 3

## 2016-07-17 NOTE — Assessment & Plan Note (Addendum)
07/10/16 Normal LFTs  Repeat US to assess ascites & R/O hepatocellular cancer in context of PMH Hep C & rapid weight loss. Minimize Tylenol dose & decrease statin dose

## 2016-07-17 NOTE — Assessment & Plan Note (Signed)
Discontinue warfarin Vitamin K 1 mg X 1 Consider novel oral anticoagulants if anticoagulation necessary Assess possible role of hepatic dysfunction in the elevated INR

## 2016-07-17 NOTE — Patient Instructions (Signed)
See assessment and plan under each diagnosis in the problem list and acutely for this visit 

## 2016-07-18 LAB — CBC AND DIFFERENTIAL
HCT: 23 % — AB (ref 41–53)
Hemoglobin: 7.4 g/dL — AB (ref 13.5–17.5)
PLATELETS: 343 10*3/uL (ref 150–399)
WBC: 7.6 10^3/mL

## 2016-07-18 LAB — TSH: TSH: 2.74 u[IU]/mL (ref 0.41–5.90)

## 2016-07-18 LAB — PROTIME-INR: Protime: 48.3 seconds — AB (ref 10.0–13.8)

## 2016-07-18 LAB — POCT INR: INR: 5.2 — AB (ref 0.9–1.1)

## 2016-07-22 ENCOUNTER — Encounter: Payer: Self-pay | Admitting: Internal Medicine

## 2016-07-22 ENCOUNTER — Non-Acute Institutional Stay (SKILLED_NURSING_FACILITY): Payer: Medicare Other | Admitting: Internal Medicine

## 2016-07-22 ENCOUNTER — Ambulatory Visit (INDEPENDENT_AMBULATORY_CARE_PROVIDER_SITE_OTHER): Payer: Medicare Other | Admitting: Vascular Surgery

## 2016-07-22 ENCOUNTER — Encounter: Payer: Self-pay | Admitting: Vascular Surgery

## 2016-07-22 VITALS — BP 123/75 | HR 87 | Temp 97.3°F | Resp 20 | Ht 70.0 in | Wt 179.0 lb

## 2016-07-22 DIAGNOSIS — R1011 Right upper quadrant pain: Secondary | ICD-10-CM | POA: Diagnosis not present

## 2016-07-22 DIAGNOSIS — R188 Other ascites: Secondary | ICD-10-CM | POA: Diagnosis not present

## 2016-07-22 DIAGNOSIS — I739 Peripheral vascular disease, unspecified: Secondary | ICD-10-CM

## 2016-07-22 DIAGNOSIS — Z89611 Acquired absence of right leg above knee: Secondary | ICD-10-CM

## 2016-07-22 DIAGNOSIS — N289 Disorder of kidney and ureter, unspecified: Secondary | ICD-10-CM

## 2016-07-22 DIAGNOSIS — I25810 Atherosclerosis of coronary artery bypass graft(s) without angina pectoris: Secondary | ICD-10-CM

## 2016-07-22 DIAGNOSIS — R791 Abnormal coagulation profile: Secondary | ICD-10-CM | POA: Diagnosis not present

## 2016-07-22 DIAGNOSIS — D649 Anemia, unspecified: Secondary | ICD-10-CM | POA: Diagnosis not present

## 2016-07-22 NOTE — Assessment & Plan Note (Signed)
Warfarin has been discontinued, low-dose Eliquis will be considered

## 2016-07-22 NOTE — Assessment & Plan Note (Signed)
Spironolactone was discontinued when creatinine rose to 3.3  BMET

## 2016-07-22 NOTE — Assessment & Plan Note (Signed)
GI is to schedule repeat paracentesis to reassess etiology of the ascites

## 2016-07-22 NOTE — Patient Instructions (Signed)
See assessment and plan under each diagnosis in the problem list and acutely for this visit 

## 2016-07-22 NOTE — Progress Notes (Signed)
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 male here for follow-up. He continues to be at the [facility] nursing facility. He is receiving daily care to his left foot. 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 His main complaint today is of right sided pleuritic chest pain. Apparently he underwent paracentesis of approximately liter of ascitic fluid in one month ago. Reports that he has a similar sensation with pain in the subcostal area with deep inspiration. He is having no pain in his left foot.  Past Medical History:  Diagnosis Date  . CAD (coronary artery disease)    01/15/16 he states he has had a heart attack  . CVA (cerebral infarction)   . DM (diabetes mellitus), type 2 with peripheral vascular complications (HCC)   . History of hepatitis C 1990s   Hep B immune  . Peripheral vascular disease (HCC)     Family History  Problem Relation Age of Onset  . Hypertension Other   . Diabetes Other   . Mental illness Other   . Lupus Other   . Diabetes Father   . Cancer Neg Hx   . Heart disease Neg Hx   . Stroke Neg Hx     SOCIAL HISTORY: Social History  Substance Use Topics  . Smoking status: Current Every Day Smoker    Packs/day: 0.10    Years: 54.00    Types: Cigarettes  . Smokeless tobacco: Never Used  . Alcohol use Yes     Comment: 04/02/2016 "aint suppose to drink in the nursing home"    Allergies  Allergen Reactions  . Percocet [Oxycodone-Acetaminophen]     Needing narcan on multiple occasions due to oversedation after getting percocet   . Tramadol     Excess sedation; ? Related to pre-existing hepatic dysfunction with PMH Hep C    Current Outpatient Prescriptions  Medication Sig Dispense Refill  . acetaminophen (TYLENOL) 325 MG  tablet Take 650 mg by mouth every 6 (six) hours as needed.    Marland Kitchen acetaminophen (TYLENOL) 500 MG tablet Take 500 mg by mouth every 6 (six) hours as needed.    . Amino Acids-Protein Hydrolys (FEEDING SUPPLEMENT, PRO-STAT SUGAR FREE 64,) LIQD Take 30 mLs by mouth 2 (two) times daily.    Marland Kitchen amitriptyline (ELAVIL) 50 MG tablet Take 50 mg by mouth at bedtime.    Marland Kitchen atorvastatin (LIPITOR) 40 MG tablet Take 40 mg by mouth daily.    . barrier cream (NON-SPECIFIED) CREA Apply 1 application topically 2 (two) times daily.    . carvedilol (COREG) 6.25 MG tablet Take 6.25 mg by mouth 2 (two) times daily with a meal.    . docusate sodium (COLACE) 100 MG capsule Take 100 mg by mouth 2 (two) times daily.    . DULoxetine (CYMBALTA) 30 MG capsule Take 30 mg by mouth daily.    . ferrous sulfate 325 (65 FE) MG tablet Take 325 mg by mouth 2 (two) times daily with a meal.    . furosemide (LASIX) 40 MG tablet Take 40 mg by mouth 2 (two) times daily.     . hydrALAZINE (APRESOLINE) 25 MG tablet Take 25 mg by mouth 3 (three) times daily.    . insulin glargine (LANTUS) 100 unit/mL SOPN Inject 17 Units into the skin at bedtime.    Marland Kitchen  ipratropium-albuterol (DUONEB) 0.5-2.5 (3) MG/3ML SOLN Take 3 mLs by nebulization every 6 (six) hours as needed (shortness of breath).     . isosorbide mononitrate (IMDUR) 30 MG 24 hr tablet Take 1 tablet (30 mg total) by mouth daily. 30 tablet 1  . levothyroxine (SYNTHROID, LEVOTHROID) 25 MCG tablet Take 25 mcg by mouth daily before breakfast.    . nitroGLYCERIN (NITROSTAT) 0.4 MG SL tablet Place 0.4 mg under the tongue every 5 (five) minutes as needed for chest pain.    . OXYGEN Inhale into the lungs. 2lpm via [place] prn for shortness of breath or O2 sat <90%    . pantoprazole (PROTONIX) 40 MG tablet Take 1 tablet (40 mg total) by mouth daily. 30 tablet 3  . warfarin (COUMADIN) 2.5 MG tablet Take 2.5 mg by mouth as directed. On Tuesday and Thursday  D/C COUMADIN on 4/3 PT/INR x3 days    . warfarin  (COUMADIN) 5 MG tablet Give 5 mg by mouth every Mon, Wednesday, Fri, Saturday and Sunday D/C COUMADIN ON 07/15/16 (PT/INR x3 days)     No current facility-administered medications for this visit.     REVIEW OF SYSTEMS:   denotes positive finding,  denotes negative finding Cardiac  Comments:  Chest pain or chest pressure: x   Shortness of breath upon exertion:    Short of breath when lying flat:    Irregular heart rhythm:        Vascular    Pain in calf, thigh, or hip brought on by ambulation:    Pain in feet at night that wakes you up from your sleep:     Blood clot in your veins:    Leg swelling:           PHYSICAL EXAM: Vitals:   07/22/16 1126  BP: 123/75  Pulse: 87  Resp: 20  Temp: 97.3 F (36.3 C)  TempSrc: Oral  SpO2: 96%  Weight: 179 lb (81.2 kg)  Height:  (1.778 m)    GENERAL: The patient is a well-nourished male, in no acute distress. The vital signs are documented above. CARDIOVASCULAR: Palpable popliteal pulse on the left absent pedal pulses PULMONARY: There is good air exchange  MUSCULOSKELETAL: Right above-knee amputation NEUROLOGIC: No focal weakness or paresthesias are detected. SKIN: Open amputation of left fourth and fifth toe and metatarsal head. Continued healing of this with constriction of the wound PSYCHIATRIC: The patient has a normal affect.  DATA:  None   MEDICAL ISSUES: Very slow healing of his open amputation from 04/04/2016. Explained that with normal popliteal flow would expect more rapid healing than this. Nonetheless is slowly progressing. Would recommend continued daily dressing changes with a dry dressing. We will see him again in 2-3 months. He'll notify should he develop any worsening. He will address his pleuritic pain with Dr. Alwyn Ren at [facility]    Larina Earthly, MD [facility] Tel [phone] Pager [phone]

## 2016-07-22 NOTE — Assessment & Plan Note (Signed)
Repeat CBC to assess whether Eliquis can be initiated

## 2016-07-22 NOTE — Progress Notes (Signed)
Facility Location: [facility]  Room Number:113-A  Code Status: Full Code  This is a nursing facility follow up for specific acute issue of R abdominal pain.  Interim medical record and care since last [facility] visit was updated with review of diagnostic studies and change in clinical status since last visit were documented.  HPI: He describes right-sided abdominal pain as sharp and lasting minutes over the right mid/ upper abdomen which is nonradiating. Other than constipation he denies any other GI symptoms. He also describes chronic shortness of breath and intermittent productive cough. He was seen by his peripheral vascular surgeon today, he reports that the surgeon is concerned about the delay in healing. That note was reviewed, Dr. Arbie Cookey thought the patient was describing pleuritic pain.  The patient recently was off his warfarin because of excessively high PT/INRs. PT/INR was as high as 7.2 even after discontinuation of warfarin. Most recent value was 5.2 on 4/6. With excess anticoagulation hemoglobin dropped to 7.4.  Abdominal distention and apparent ascites was evaluated with ultrasound. This revealed no significant change. The patient was seen by gastroenterology 07/04/16. When previously hospitalized 1.8 L of fluid was drawn off by paracentesis. Chemistries suggested this was not due to cirrhosis but was of cardiac or renal etiology. Colonoscopy was never repeated at that time as the patient declined the procedure demanding to be discharged. No active GI bleed was felt to be present. GI recommended the patient be referred to cardiology or nephrology  to assess etiology of ascites in the future. GI did order repet paracentsis .No changes recommended in the Aldactone and Lasix. Unfortunately the Aldactone had to be discontinued because the creatinine rose to 3.3. The most recent creatinine was 1.7 on 4/3.  Review of systems:  Constitutional: No  fever  Cardiovascular: No palpitations,paroxysmal nocturnal dyspnea, claudication, edema  Respiratory: No hemoptysis,  significant snoring,apnea   Gastrointestinal: No heartburn,dysphagia,abdominal pain, nausea / vomiting,rectal bleeding, melena Genitourinary: No dysuria,hematuria, pyuria,  incontinence, nocturia Musculoskeletal: No joint stiffness, joint swelling, weakness,pain Dermatologic: No rash, pruritus, change in appearance of skin  Physical exam:  Pertinent or positive findings:Pattern alopecia is present. He is very lethargic. Speech is somewhat slurred. He has a few lower teeth but is otherwise edentulous. Breath sounds are decreased. Abdomen is distended and quiet. Dullness to percussion over the right abdomen. He has sclerotic hyperpigmented changes in the left shin. Left pedal pulses are decreased. Amputationn is present in the right lower extremity.   General appearance:Adequately nourished; no acute distress , increased work of breathing is present.   Lymphatic: No lymphadenopathy about the head, neck, axilla . Eyes: No conjunctival inflammation or lid edema is present. There is no scleral icterus. Ears:  External ear exam shows no significant lesions or deformities.   Nose:  External nasal examination shows no deformity or inflammation. Nasal mucosa are pink and moist without lesions ,exudates Oral exam: lips and gums are healthy appearing.There is no oropharyngeal erythema or exudate . Neck:  No thyromegaly, masses, tenderness noted.    Heart:  Normal rate and regular rhythm. S1 and S2 normal without gallop, murmur, click, rub .  Lungs:Chest clear to auscultation without wheezes, rhonchi,rales , rubs. GU: deferred  Extremities:  No cyanosis, clubbing,edema  Strength equal  in upper & lower extremities Balance,Rhomberg,finger to nose testing could not be completed due to clinical state Skin: Warm & dry w/o tenting. No significant  rash.  See summary under each active  problem in the Problem  List with associated updated therapeutic plan

## 2016-07-23 ENCOUNTER — Encounter: Payer: Self-pay | Admitting: Nurse Practitioner

## 2016-07-23 NOTE — Progress Notes (Signed)
This encounter was created in error - please disregard.

## 2016-07-24 LAB — CBC AND DIFFERENTIAL
HCT: 28 % — AB (ref 41–53)
HEMOGLOBIN: 8.3 g/dL — AB (ref 13.5–17.5)
PLATELETS: 338 10*3/uL (ref 150–399)
WBC: 6.9 10^3/mL

## 2016-07-24 LAB — BASIC METABOLIC PANEL
BUN: 38 mg/dL — AB (ref 4–21)
Creatinine: 1.1 mg/dL (ref 0.6–1.3)
Glucose: 84 mg/dL
Potassium: 4.9 mmol/L (ref 3.4–5.3)
Sodium: 135 mmol/L — AB (ref 137–147)

## 2016-08-02 ENCOUNTER — Encounter (HOSPITAL_COMMUNITY): Payer: Self-pay

## 2016-08-02 ENCOUNTER — Inpatient Hospital Stay (HOSPITAL_COMMUNITY): Payer: Medicare Other

## 2016-08-02 ENCOUNTER — Emergency Department (HOSPITAL_COMMUNITY): Payer: Medicare Other

## 2016-08-02 ENCOUNTER — Inpatient Hospital Stay (HOSPITAL_COMMUNITY)
Admission: EM | Admit: 2016-08-02 | Discharge: 2016-08-07 | DRG: 432 | Disposition: A | Discharge status: Skilled Nursing Facility | Payer: Medicare Other | Attending: Internal Medicine | Admitting: Internal Medicine

## 2016-08-02 DIAGNOSIS — I2510 Atherosclerotic heart disease of native coronary artery without angina pectoris: Secondary | ICD-10-CM | POA: Diagnosis present

## 2016-08-02 DIAGNOSIS — N179 Acute kidney failure, unspecified: Secondary | ICD-10-CM | POA: Diagnosis present

## 2016-08-02 DIAGNOSIS — I130 Hypertensive heart and chronic kidney disease with heart failure and stage 1 through stage 4 chronic kidney disease, or unspecified chronic kidney disease: Secondary | ICD-10-CM | POA: Diagnosis present

## 2016-08-02 DIAGNOSIS — E1151 Type 2 diabetes mellitus with diabetic peripheral angiopathy without gangrene: Secondary | ICD-10-CM

## 2016-08-02 DIAGNOSIS — Z89422 Acquired absence of other left toe(s): Secondary | ICD-10-CM

## 2016-08-02 DIAGNOSIS — Z833 Family history of diabetes mellitus: Secondary | ICD-10-CM

## 2016-08-02 DIAGNOSIS — I300 Acute nonspecific idiopathic pericarditis: Secondary | ICD-10-CM | POA: Diagnosis not present

## 2016-08-02 DIAGNOSIS — B1920 Unspecified viral hepatitis C without hepatic coma: Secondary | ICD-10-CM | POA: Diagnosis present

## 2016-08-02 DIAGNOSIS — D649 Anemia, unspecified: Secondary | ICD-10-CM | POA: Diagnosis present

## 2016-08-02 DIAGNOSIS — R809 Proteinuria, unspecified: Secondary | ICD-10-CM | POA: Diagnosis present

## 2016-08-02 DIAGNOSIS — E1142 Type 2 diabetes mellitus with diabetic polyneuropathy: Secondary | ICD-10-CM | POA: Diagnosis present

## 2016-08-02 DIAGNOSIS — I70245 Atherosclerosis of native arteries of left leg with ulceration of other part of foot: Secondary | ICD-10-CM | POA: Diagnosis not present

## 2016-08-02 DIAGNOSIS — Z951 Presence of aortocoronary bypass graft: Secondary | ICD-10-CM

## 2016-08-02 DIAGNOSIS — L089 Local infection of the skin and subcutaneous tissue, unspecified: Secondary | ICD-10-CM | POA: Diagnosis not present

## 2016-08-02 DIAGNOSIS — I509 Heart failure, unspecified: Secondary | ICD-10-CM

## 2016-08-02 DIAGNOSIS — I480 Paroxysmal atrial fibrillation: Secondary | ICD-10-CM | POA: Diagnosis present

## 2016-08-02 DIAGNOSIS — E8809 Other disorders of plasma-protein metabolism, not elsewhere classified: Secondary | ICD-10-CM | POA: Diagnosis present

## 2016-08-02 DIAGNOSIS — I513 Intracardiac thrombosis, not elsewhere classified: Secondary | ICD-10-CM | POA: Diagnosis present

## 2016-08-02 DIAGNOSIS — F329 Major depressive disorder, single episode, unspecified: Secondary | ICD-10-CM | POA: Diagnosis present

## 2016-08-02 DIAGNOSIS — I5043 Acute on chronic combined systolic (congestive) and diastolic (congestive) heart failure: Secondary | ICD-10-CM | POA: Diagnosis present

## 2016-08-02 DIAGNOSIS — E8779 Other fluid overload: Secondary | ICD-10-CM

## 2016-08-02 DIAGNOSIS — L97529 Non-pressure chronic ulcer of other part of left foot with unspecified severity: Secondary | ICD-10-CM | POA: Diagnosis present

## 2016-08-02 DIAGNOSIS — L97528 Non-pressure chronic ulcer of other part of left foot with other specified severity: Secondary | ICD-10-CM

## 2016-08-02 DIAGNOSIS — E1122 Type 2 diabetes mellitus with diabetic chronic kidney disease: Secondary | ICD-10-CM | POA: Diagnosis present

## 2016-08-02 DIAGNOSIS — N189 Chronic kidney disease, unspecified: Secondary | ICD-10-CM

## 2016-08-02 DIAGNOSIS — R7989 Other specified abnormal findings of blood chemistry: Secondary | ICD-10-CM | POA: Diagnosis not present

## 2016-08-02 DIAGNOSIS — E11621 Type 2 diabetes mellitus with foot ulcer: Secondary | ICD-10-CM

## 2016-08-02 DIAGNOSIS — Z89611 Acquired absence of right leg above knee: Secondary | ICD-10-CM | POA: Diagnosis not present

## 2016-08-02 DIAGNOSIS — M869 Osteomyelitis, unspecified: Secondary | ICD-10-CM

## 2016-08-02 DIAGNOSIS — E871 Hypo-osmolality and hyponatremia: Secondary | ICD-10-CM | POA: Diagnosis present

## 2016-08-02 DIAGNOSIS — K7469 Other cirrhosis of liver: Principal | ICD-10-CM | POA: Diagnosis present

## 2016-08-02 DIAGNOSIS — F17210 Nicotine dependence, cigarettes, uncomplicated: Secondary | ICD-10-CM | POA: Diagnosis present

## 2016-08-02 DIAGNOSIS — E039 Hypothyroidism, unspecified: Secondary | ICD-10-CM | POA: Diagnosis not present

## 2016-08-02 DIAGNOSIS — Z794 Long term (current) use of insulin: Secondary | ICD-10-CM

## 2016-08-02 DIAGNOSIS — J918 Pleural effusion in other conditions classified elsewhere: Secondary | ICD-10-CM | POA: Diagnosis present

## 2016-08-02 DIAGNOSIS — I10 Essential (primary) hypertension: Secondary | ICD-10-CM | POA: Diagnosis present

## 2016-08-02 DIAGNOSIS — Z8673 Personal history of transient ischemic attack (TIA), and cerebral infarction without residual deficits: Secondary | ICD-10-CM

## 2016-08-02 DIAGNOSIS — I340 Nonrheumatic mitral (valve) insufficiency: Secondary | ICD-10-CM | POA: Diagnosis present

## 2016-08-02 DIAGNOSIS — Z79899 Other long term (current) drug therapy: Secondary | ICD-10-CM

## 2016-08-02 DIAGNOSIS — E785 Hyperlipidemia, unspecified: Secondary | ICD-10-CM | POA: Diagnosis present

## 2016-08-02 DIAGNOSIS — I252 Old myocardial infarction: Secondary | ICD-10-CM

## 2016-08-02 DIAGNOSIS — N183 Chronic kidney disease, stage 3 (moderate): Secondary | ICD-10-CM | POA: Diagnosis present

## 2016-08-02 DIAGNOSIS — L0291 Cutaneous abscess, unspecified: Secondary | ICD-10-CM

## 2016-08-02 DIAGNOSIS — R091 Pleurisy: Secondary | ICD-10-CM

## 2016-08-02 DIAGNOSIS — J90 Pleural effusion, not elsewhere classified: Secondary | ICD-10-CM

## 2016-08-02 DIAGNOSIS — R0781 Pleurodynia: Secondary | ICD-10-CM

## 2016-08-02 DIAGNOSIS — R071 Chest pain on breathing: Secondary | ICD-10-CM | POA: Diagnosis not present

## 2016-08-02 DIAGNOSIS — R079 Chest pain, unspecified: Secondary | ICD-10-CM | POA: Diagnosis present

## 2016-08-02 DIAGNOSIS — Z9981 Dependence on supplemental oxygen: Secondary | ICD-10-CM

## 2016-08-02 DIAGNOSIS — K7460 Unspecified cirrhosis of liver: Secondary | ICD-10-CM | POA: Diagnosis present

## 2016-08-02 DIAGNOSIS — I5021 Acute systolic (congestive) heart failure: Secondary | ICD-10-CM | POA: Diagnosis not present

## 2016-08-02 DIAGNOSIS — I493 Ventricular premature depolarization: Secondary | ICD-10-CM | POA: Diagnosis present

## 2016-08-02 LAB — COMPREHENSIVE METABOLIC PANEL
ALT: 8 U/L — AB (ref 17–63)
ANION GAP: 8 (ref 5–15)
AST: 19 U/L (ref 15–41)
Albumin: 2.4 g/dL — ABNORMAL LOW (ref 3.5–5.0)
Alkaline Phosphatase: 141 U/L — ABNORMAL HIGH (ref 38–126)
BUN: 24 mg/dL — ABNORMAL HIGH (ref 6–20)
CHLORIDE: 99 mmol/L — AB (ref 101–111)
CO2: 27 mmol/L (ref 22–32)
Calcium: 8.6 mg/dL — ABNORMAL LOW (ref 8.9–10.3)
Creatinine, Ser: 1.09 mg/dL (ref 0.61–1.24)
GFR calc Af Amer: >60 mL/min (ref >60)
GLUCOSE: 121 mg/dL — AB (ref 65–99)
POTASSIUM: 4.4 mmol/L (ref 3.5–5.1)
Sodium: 134 mmol/L — ABNORMAL LOW (ref 135–145)
Total Bilirubin: 1.6 mg/dL — ABNORMAL HIGH (ref 0.3–1.2)
Total Protein: 7.8 g/dL (ref 6.5–8.1)

## 2016-08-02 LAB — TROPONIN I

## 2016-08-02 LAB — C-REACTIVE PROTEIN: CRP: 8.9 mg/dL — ABNORMAL HIGH (ref <1.0)

## 2016-08-02 LAB — I-STAT CG4 LACTIC ACID, ED: LACTIC ACID, VENOUS: 0.63 mmol/L (ref 0.5–1.9)

## 2016-08-02 LAB — CBC
HEMATOCRIT: 30 % — AB (ref 39.0–52.0)
Hemoglobin: 9.1 g/dL — ABNORMAL LOW (ref 13.0–17.0)
MCH: 26.5 pg (ref 26.0–34.0)
MCHC: 30.3 g/dL (ref 30.0–36.0)
MCV: 87.2 fL (ref 78.0–100.0)
Platelets: 437 10*3/uL — ABNORMAL HIGH (ref 150–400)
RBC: 3.44 MIL/uL — ABNORMAL LOW (ref 4.22–5.81)
RDW: 18.1 % — AB (ref 11.5–15.5)
WBC: 8 10*3/uL (ref 4.0–10.5)

## 2016-08-02 LAB — GLUCOSE, CAPILLARY: Glucose-Capillary: 109 mg/dL — ABNORMAL HIGH (ref 65–99)

## 2016-08-02 LAB — PROTIME-INR
INR: 1.26
PROTHROMBIN TIME: 15.9 s — AB (ref 11.4–15.2)

## 2016-08-02 LAB — I-STAT TROPONIN, ED: Troponin i, poc: 0 ng/mL (ref 0.00–0.08)

## 2016-08-02 LAB — SEDIMENTATION RATE: Sed Rate: 75 mm/hr — ABNORMAL HIGH (ref 0–16)

## 2016-08-02 LAB — D-DIMER, QUANTITATIVE: D-Dimer, Quant: 8.12 ug/mL-FEU — ABNORMAL HIGH (ref 0.00–0.50)

## 2016-08-02 LAB — BRAIN NATRIURETIC PEPTIDE: B NATRIURETIC PEPTIDE 5: 1021.2 pg/mL — AB (ref 0.0–100.0)

## 2016-08-02 LAB — PREALBUMIN: PREALBUMIN: 5.6 mg/dL — AB (ref 18–38)

## 2016-08-02 MED ORDER — DULOXETINE HCL 30 MG PO CPEP
30.0000 mg | ORAL_CAPSULE | Freq: Every day | ORAL | Status: DC
  Administered 2016-08-02 – 2016-08-07 (×6): 30 mg via ORAL
  Filled 2016-08-02 (×5): qty 1

## 2016-08-02 MED ORDER — DOCUSATE SODIUM 100 MG PO CAPS
100.0000 mg | ORAL_CAPSULE | Freq: Two times a day (BID) | ORAL | Status: DC
  Administered 2016-08-02 – 2016-08-07 (×5): 100 mg via ORAL
  Filled 2016-08-02 (×7): qty 1

## 2016-08-02 MED ORDER — LEVOTHYROXINE SODIUM 25 MCG PO TABS
25.0000 ug | ORAL_TABLET | Freq: Every day | ORAL | Status: DC
  Administered 2016-08-03 – 2016-08-07 (×5): 25 ug via ORAL
  Filled 2016-08-02 (×5): qty 1

## 2016-08-02 MED ORDER — ACETAMINOPHEN 650 MG RE SUPP: 650.0000 mg | Freq: Four times a day (QID) | RECTAL | Status: DC | PRN

## 2016-08-02 MED ORDER — NITROGLYCERIN 0.4 MG SL SUBL: 0.4000 mg | SUBLINGUAL_TABLET | SUBLINGUAL | Status: DC | PRN

## 2016-08-02 MED ORDER — ASPIRIN EC 81 MG PO TBEC
81.0000 mg | DELAYED_RELEASE_TABLET | Freq: Every day | ORAL | Status: DC
  Administered 2016-08-03 – 2016-08-07 (×5): 81 mg via ORAL
  Filled 2016-08-02 (×5): qty 1

## 2016-08-02 MED ORDER — ONDANSETRON HCL 4 MG PO TABS: 4.0000 mg | ORAL_TABLET | Freq: Four times a day (QID) | ORAL | Status: DC | PRN

## 2016-08-02 MED ORDER — ONDANSETRON HCL 4 MG/2ML IJ SOLN: 4.0000 mg | Freq: Four times a day (QID) | INTRAMUSCULAR | Status: DC | PRN

## 2016-08-02 MED ORDER — FUROSEMIDE 10 MG/ML IJ SOLN: 40.0000 mg | Freq: Two times a day (BID) | INTRAMUSCULAR | Status: DC

## 2016-08-02 MED ORDER — HYDROCODONE-ACETAMINOPHEN 5-325 MG PO TABS
1.0000 | ORAL_TABLET | Freq: Four times a day (QID) | ORAL | Status: DC | PRN
  Administered 2016-08-02 – 2016-08-04 (×3): 1 via ORAL
  Filled 2016-08-02 (×3): qty 1

## 2016-08-02 MED ORDER — PRO-STAT SUGAR FREE PO LIQD: 30.0000 mL | Freq: Two times a day (BID) | ORAL | Status: DC

## 2016-08-02 MED ORDER — INSULIN ASPART 100 UNIT/ML ~~LOC~~ SOLN
0.0000 [IU] | Freq: Three times a day (TID) | SUBCUTANEOUS | Status: DC
  Administered 2016-08-03: 2 [IU] via SUBCUTANEOUS
  Administered 2016-08-03: 3 [IU] via SUBCUTANEOUS
  Administered 2016-08-04 – 2016-08-07 (×7): 2 [IU] via SUBCUTANEOUS

## 2016-08-02 MED ORDER — ISOSORBIDE MONONITRATE ER 30 MG PO TB24
30.0000 mg | ORAL_TABLET | Freq: Every day | ORAL | Status: DC
  Administered 2016-08-03 – 2016-08-07 (×5): 30 mg via ORAL
  Filled 2016-08-02 (×4): qty 1

## 2016-08-02 MED ORDER — ACETAMINOPHEN 325 MG PO TABS: 650.0000 mg | ORAL_TABLET | Freq: Four times a day (QID) | ORAL | Status: DC | PRN

## 2016-08-02 MED ORDER — FUROSEMIDE 10 MG/ML IJ SOLN
40.0000 mg | Freq: Once | INTRAMUSCULAR | Status: AC
  Administered 2016-08-02: 40 mg via INTRAVENOUS
  Filled 2016-08-02: qty 4

## 2016-08-02 MED ORDER — POLYETHYLENE GLYCOL 3350 17 G PO PACK
17.0000 g | PACK | Freq: Every day | ORAL | Status: DC
  Administered 2016-08-03 – 2016-08-06 (×2): 17 g via ORAL
  Filled 2016-08-02 (×4): qty 1

## 2016-08-02 MED ORDER — FENTANYL CITRATE (PF) 100 MCG/2ML IJ SOLN
12.5000 ug | Freq: Once | INTRAMUSCULAR | Status: AC
  Administered 2016-08-02: 12.5 ug via INTRAVENOUS
  Filled 2016-08-02: qty 2

## 2016-08-02 MED ORDER — FERROUS SULFATE 325 (65 FE) MG PO TABS
325.0000 mg | ORAL_TABLET | Freq: Two times a day (BID) | ORAL | Status: DC
  Administered 2016-08-03 – 2016-08-07 (×9): 325 mg via ORAL
  Filled 2016-08-02 (×9): qty 1

## 2016-08-02 MED ORDER — HEPARIN (PORCINE) IN NACL 100-0.45 UNIT/ML-% IJ SOLN
1600.0000 [IU]/h | INTRAMUSCULAR | Status: DC
  Administered 2016-08-02: 1350 [IU]/h via INTRAVENOUS
  Filled 2016-08-02 (×2): qty 250

## 2016-08-02 MED ORDER — INSULIN ASPART 100 UNIT/ML ~~LOC~~ SOLN: 0.0000 [IU] | Freq: Every day | SUBCUTANEOUS | Status: DC

## 2016-08-02 MED ORDER — INSULIN GLARGINE 100 UNIT/ML ~~LOC~~ SOLN
17.0000 [IU] | Freq: Every day | SUBCUTANEOUS | Status: DC
  Administered 2016-08-02 – 2016-08-06 (×5): 17 [IU] via SUBCUTANEOUS
  Filled 2016-08-02 (×6): qty 0.17

## 2016-08-02 MED ORDER — IOPAMIDOL (ISOVUE-370) INJECTION 76%
INTRAVENOUS | Status: AC
  Administered 2016-08-02: 100 mL
  Filled 2016-08-02: qty 100

## 2016-08-02 MED ORDER — OXYCODONE HCL 5 MG PO TABS: 5.0000 mg | ORAL_TABLET | ORAL | Status: DC | PRN

## 2016-08-02 MED ORDER — IPRATROPIUM-ALBUTEROL 0.5-2.5 (3) MG/3ML IN SOLN: 3.0000 mL | Freq: Four times a day (QID) | RESPIRATORY_TRACT | Status: DC | PRN

## 2016-08-02 MED ORDER — AMITRIPTYLINE HCL 25 MG PO TABS: 50.0000 mg | ORAL_TABLET | Freq: Every day | ORAL | Status: DC

## 2016-08-02 MED ORDER — HYDRALAZINE HCL 50 MG PO TABS
50.0000 mg | ORAL_TABLET | Freq: Three times a day (TID) | ORAL | Status: DC
  Administered 2016-08-02 – 2016-08-07 (×14): 50 mg via ORAL
  Filled 2016-08-02 (×14): qty 1

## 2016-08-02 MED ORDER — CARVEDILOL 6.25 MG PO TABS
6.2500 mg | ORAL_TABLET | Freq: Two times a day (BID) | ORAL | Status: DC
  Administered 2016-08-03 – 2016-08-07 (×9): 6.25 mg via ORAL
  Filled 2016-08-02 (×9): qty 1

## 2016-08-02 MED ORDER — ATORVASTATIN CALCIUM 20 MG PO TABS
20.0000 mg | ORAL_TABLET | Freq: Every day | ORAL | Status: DC
  Administered 2016-08-02 – 2016-08-06 (×5): 20 mg via ORAL
  Filled 2016-08-02 (×4): qty 1

## 2016-08-02 MED ORDER — HEPARIN BOLUS VIA INFUSION
4000.0000 [IU] | Freq: Once | INTRAVENOUS | Status: AC
Start: 2016-08-02 — End: 2016-08-02
  Administered 2016-08-02: 4000 [IU] via INTRAVENOUS
  Filled 2016-08-02: qty 4000

## 2016-08-02 MED ORDER — PANTOPRAZOLE SODIUM 40 MG PO TBEC
40.0000 mg | DELAYED_RELEASE_TABLET | Freq: Every day | ORAL | Status: DC
  Administered 2016-08-03 – 2016-08-07 (×5): 40 mg via ORAL
  Filled 2016-08-02 (×5): qty 1

## 2016-08-02 NOTE — ED Triage Notes (Addendum)
Pt presents to the ed with ems from [facility] for complaints of left sided chest pain that is worse with a deep breath in.  Patient received 3 nitro and 4 baby aspirin in route with ems and at nursing home facility. Pain went from an 8/10 to a 5/10.  Pt is in rehab for a toe amputation on his left foot.

## 2016-08-02 NOTE — H&P (Signed)
History and Physical    Ronald Simpson ZOX:[id number] DOB: 1946-02-16 DOA: 08/02/2016  PCP: Ronald Melnick, MD   Patient coming from: SNF  Chief Complaint: Pleuritic chest pain  HPI: Ronald Simpson is a 71 y.o. 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  Onset around 1AM this morning while at rest.  Associated with DOE but he is not particularly short of breath at rest.  No associated nausea, vomiting, or diaphoresis.  Was initially 8 out of 10 in intensity.  Resolved after four baby aspirin and 3 SL nitro with EMS.  ED Course: EKG shows NSR with PVCs and mild ST depression in some of his anterior leads.  First troponin negative.  D-Dimer 8.  BNP 1021.  Hgb 9.  BUN 24.  Creatinine 1.09.  LA 0.63.  INR 1.26.  Chest xray shows pleural effusions, cardiomegaly, and pulmonary vascular congestion.  Bibasilar opacities concerning for edema vs infiltrate vs atelectasis.  CTA chest negative for PE but left atrial appendage does not fill with contrast, suggesting thrombus.  Large right effusion, smaller on left, with atelectasis.  Coronary atherosclerosis also seen.  IV heparin to be initiated in the ED.  IV lasix also ordered for diuresis.  Hospitalist asked to admit.  Review of Systems: As per HPI otherwise 10 systems reviewed and negative.   Past Medical History:  Diagnosis Date  . CAD (coronary artery disease)    01/15/16 he states he has had a heart attack  . CVA (cerebral infarction)   . DM (diabetes mellitus), type 2 with peripheral vascular complications (HCC)   . History of hepatitis C 1990s   Hep B immune  . Peripheral  vascular disease [facility])     Past Surgical History:  Procedure Laterality Date  . AMPUTATION Right 01/07/2016   Procedure: AMPUTATION ABOVE KNEE;  Surgeon: Ronald Earthly, MD;  Location: [facility] OR;  Service: Vascular;  Laterality: Right;  . AMPUTATION Left 04/04/2016   Procedure: LEFT FOURTH AND FIFTH  TOE AMPUTATION;  Surgeon: Ronald Earthly, MD;  Location: [facility] OR;  Service: Vascular;  Laterality: Left;  . BELOW KNEE LEG AMPUTATION Right   . CORONARY ARTERY BYPASS GRAFT  2005  . ESOPHAGOGASTRODUODENOSCOPY N/A 05/17/2016   Procedure: ESOPHAGOGASTRODUODENOSCOPY (EGD);  Surgeon: Ronald Dare, MD;  Location: [facility] ENDOSCOPY;  Service: Endoscopy;  Laterality: N/A;  . IR GENERIC HISTORICAL  07/07/2016   IR PARACENTESIS 07/07/2016 Ronald Crane, PA-C [facility]-INTERV RAD  . PERIPHERAL VASCULAR CATHETERIZATION N/A 01/11/2016   Procedure: Lower Extremity Angiography;  Surgeon: Ronald Libman, MD;  Location: [facility] INVASIVE CV LAB;  Service: Cardiovascular;  Laterality: N/A;  . STERNOTOMY       reports that he has been smoking Cigarettes.  He has a 5.40 pack-year smoking history. He has never used smokeless tobacco. He reports that he drinks alcohol. He reports that he uses drugs, including Cocaine and Marijuana.  Allergies  Allergen Reactions  . Percocet [Oxycodone-Acetaminophen]     Needing narcan on multiple occasions due to oversedation after getting percocet   . Tramadol     Excess sedation; ? Related to pre-existing hepatic dysfunction with PMH Hep C    Family  History  Problem Relation Age of Onset  . Hypertension Other   . Diabetes Other   . Mental illness Other   . Lupus Other   . Diabetes Father   . Cancer Neg Hx   . Heart disease Neg Hx   . Stroke Neg Hx      Prior to Admission medications   Medication Sig Start Date End Date Taking? Authorizing Provider  acetaminophen (TYLENOL) 325 MG tablet Take 325 mg by mouth every 6 (six) hours as needed.    Yes Historical Provider, MD  atorvastatin  (LIPITOR) 20 MG tablet Take 20 mg by mouth daily.   Yes Historical Provider, MD  carvedilol (COREG) 6.25 MG tablet Take 6.25 mg by mouth 2 (two) times daily with a meal.   Yes Historical Provider, MD  docusate sodium (COLACE) 100 MG capsule Take 100 mg by mouth 2 (two) times daily.   Yes Historical Provider, MD  DULoxetine (CYMBALTA) 30 MG capsule Take 30 mg by mouth daily.   Yes Historical Provider, MD  ferrous sulfate 325 (65 FE) MG tablet Take 325 mg by mouth 2 (two) times daily with a meal.   Yes Historical Provider, MD  furosemide (LASIX) 40 MG tablet Take 40 mg by mouth daily.    Yes Historical Provider, MD  hydrALAZINE (APRESOLINE) 50 MG tablet Take 50 mg by mouth 3 (three) times daily.   Yes Historical Provider, MD  insulin glargine (LANTUS) 100 unit/mL SOPN Inject 17 Units into the skin at bedtime.   Yes Historical Provider, MD  isosorbide mononitrate (IMDUR) 30 MG 24 hr tablet Take 1 tablet (30 mg total) by mouth daily. 01/14/16  Yes Ronald Bennett, MD  levothyroxine (SYNTHROID, LEVOTHROID) 25 MCG tablet Take 25 mcg by mouth daily before breakfast.   Yes Historical Provider, MD  pantoprazole (PROTONIX) 40 MG tablet Take 1 tablet (40 mg total) by mouth daily. 05/22/16  Yes Ronald Bong, MD  phenazopyridine (PYRIDIUM) 100 MG tablet Take 100 mg by mouth 3 (three) times daily as needed for pain.   Yes Historical Provider, MD  polyethylene glycol (MIRALAX / GLYCOLAX) packet Take 17 g by mouth daily.   Yes Historical Provider, MD  Amino Acids-Protein Hydrolys (FEEDING SUPPLEMENT, PRO-STAT SUGAR FREE 64,) LIQD Take 30 mLs by mouth 2 (two) times daily.    Historical Provider, MD  amitriptyline (ELAVIL) 50 MG tablet Take 50 mg by mouth at bedtime.    Historical Provider, MD  barrier cream (NON-SPECIFIED) CREA Apply 1 application topically 2 (two) times daily.    Historical Provider, MD  ipratropium-albuterol (DUONEB) 0.5-2.5 (3) MG/3ML SOLN Take 3 mLs by nebulization every 6 (six) hours as needed  (shortness of breath).     Historical Provider, MD  nitroGLYCERIN (NITROSTAT) 0.4 MG SL tablet Place 0.4 mg under the tongue every 5 (five) minutes as needed for chest pain.    Historical Provider, MD  OXYGEN Inhale into the lungs. 2lpm via [place] prn for shortness of breath or O2 sat <90%    Historical Provider, MD    Physical Exam: Vitals:   08/02/16 1700 08/02/16 1730 08/02/16 1800 08/02/16 1830  BP: (!) 141/84 (!) 148/80 121/68 (!) 110/99  Pulse: 92 91 90 87  Resp: Temp:      TempSrc:      SpO2: 100% 100% 100% 100%  Weight:      Height:          Constitutional: NAD, calm, comfortable, NONtoxic appearing. Vitals:  08/02/16 1700 08/02/16 1730 08/02/16 1800 08/02/16 1830  BP: (!) 141/84 (!) 148/80 121/68 (!) 110/99  Pulse: 92 91 90 87  Resp: Temp:      TempSrc:      SpO2: 100% 100% 100% 100%  Weight:      Height:       Eyes: PERRL, lids and conjunctivae normal ENMT: Mucous membranes are moist. Posterior pharynx clear of any exudate or lesions. Neck: normal appearance, supple, no masses Respiratory: clear to auscultation anteriorly, diminished at bilateral bases.  No wheezing, no crackles. Normal respiratory effort. No accessory muscle use.  Cardiovascular: Normal rate, regular rhythm, no murmurs / rubs / gallops. Right AKA.  1+ Edema in left foot/ankle.  Barely palpable DP pulse in left foot.    GI: abdomen is soft and compressible.  No distention.  No tenderness.  Bowel sounds are present. Musculoskeletal:  Right AKA.  Partial amputation of left foot (4th and 5th digits).  Moves all four limbs spontaneously.  No contractures. Normal muscle tone.  Skin: dry, some areas of hyperpigmentation present, left foot wound has purulent malodorous drainage. Neurologic: CN 2-12 grossly intact. Decreased sensation in his left lower extremity.  Strength symmetric in bilateral upper extremities.  Psychiatric: Normal judgment and insight. Alert and oriented x 3.  Normal mood.     Labs on Admission: I have personally reviewed following labs and imaging studies  CBC:  Recent Labs Lab 08/02/16 1610  WBC 8.0  HGB 9.1*  HCT 30.0*  MCV 87.2  PLT 437*   Basic Metabolic Panel:  Recent Labs Lab 08/02/16 1610  NA 134*  K 4.4  CL 99*  CO2 27  GLUCOSE 121*  BUN 24*  CREATININE 1.09  CALCIUM 8.6*   GFR: Estimated Creatinine Clearance: 62.8 mL/min (by C-G formula based on SCr of 1.09 mg/dL). Liver Function Tests:  Recent Labs Lab 08/02/16 1610  AST 19  ALT 8*  ALKPHOS 141*  BILITOT 1.6*  PROT 7.8  ALBUMIN 2.4*   Coagulation Profile:  Recent Labs Lab 08/02/16 1610  INR 1.26   Cardiac Enzymes: First troponin 0  D-Dimer 8  BNP 1021  Sepsis Labs:  Lactic acid level 0.63  Radiological Exams on Admission: Dg Chest 2 View  Result Date: 08/02/2016 CLINICAL DATA:  Left-sided chest pain beginning last night. EXAM: CHEST  2 VIEW COMPARISON:  04/02/2016 FINDINGS: Sequelae of prior CABG and mitral valve repair are again identified. The cardiac silhouette remains mildly enlarged. Lung volumes are diminished with mild pulmonary vascular congestion. There are moderate right and small left pleural effusions with patchy bibasilar airspace opacities bilaterally. No acute osseous abnormality is seen. IMPRESSION: 1. Moderate right and small left pleural effusions. 2. Cardiomegaly and pulmonary vascular congestion. Bibasilar opacities may reflect edema, pneumonia, or atelectasis. Electronically Signed   By: Sebastian Ache M.D.   On: 08/02/2016 15:18   Ct Angio Chest Pe W Or Wo Contrast  Result Date: 08/02/2016 CLINICAL DATA:  71 year old male with history of shortness of breath and left-sided chest pain while breathing. EXAM: CT ANGIOGRAPHY CHEST WITH CONTRAST TECHNIQUE: Multidetector CT imaging of the chest was performed using the standard protocol during bolus administration of intravenous contrast. Multiplanar CT image reconstructions and  MIPs were obtained to evaluate the vascular anatomy. CONTRAST:  80 mL of Isovue 370. COMPARISON:  No priors. FINDINGS: Cardiovascular: No filling defects within the pulmonary arterial tree to suggest underlying pulmonary embolism. Heart size is mildly enlarge. There is no  significant pericardial fluid, thickening or pericardial calcification. There is aortic atherosclerosis, as well as atherosclerosis of the great vessels of the mediastinum and the coronary arteries, including calcified atherosclerotic plaque in the left main, left anterior descending, left circumflex and right coronary arteries. Status post median sternotomy for CABG, including LIMA to the LAD, as well as mitral annuloplasty. Left atrial appendage does not fill with contrast, concerning for thrombus. Mediastinum/Nodes: No pathologically enlarged mediastinal or hilar lymph nodes. Esophagus is unremarkable in appearance. No axillary lymphadenopathy. Lungs/Pleura: Large right and moderate left pleural effusions with extensive passive atelectasis throughout the dependent portions of the lungs bilaterally. Right lower lobe is completely collapsed, as is much of the right middle lobe. No definite consolidative airspace disease. Within the aerated portions of the lungs there are no definite suspicious appearing pulmonary nodules or masses. Upper Abdomen: Aortic atherosclerosis. Musculoskeletal: Median sternotomy wires. There are no aggressive appearing lytic or blastic lesions noted in the visualized portions of the skeleton. Review of the MIP images confirms the above findings. IMPRESSION: 1. No evidence of pulmonary embolism. 2. Left atrial appendage does not fill with contrast, suggesting thrombus. This places the patient at risk for systemic embolization. Further evaluation with transesophageal echocardiography is suggested if clinically appropriate. 3. Large right and moderate left pleural effusions with extensive passive atelectasis in the dependent  portions of the lungs bilaterally, as above. 4. Aortic atherosclerosis, in addition to left main and 3 vessel coronary artery disease. Status post median sternotomy for CABG including LIMA to the LAD. Electronically Signed   By: Trudie Reed M.D.   On: 08/02/2016 19:07    EKG: Independently reviewed. NSR with PVCs.  Mild ST depression in anterior leads, not greater than 1mm.  Assessment/Plan Principal Problem:   Pleuritic chest pain Active Problems:   Diabetes mellitus with peripheral vascular disease (HCC)   History of CVA (cerebrovascular accident)   Hypothyroidism, acquired   Benign essential HTN   Paroxysmal atrial fibrillation (HCC)   Chronic anemia   Pleural effusion, bilateral   Elevated brain natriuretic peptide (BNP) level      Pleuritic chest pain with large bilateral pleural effusion on CTA chest and filling defect in the left atrial appendage concerning for thrombus.  Markedly elevated BNP also noted and signs of CHF on chest xray. --Start diuresis with IV lasix  q12h.  Hold oral lasix dose.  Strict I/O.  Daily weight.  Activate CHF pathway. --Complete echo in the AM to assess for thrombus and determine EF.  Will need cardiology consult for consideration for TEE. --Anticoagulate with IV heparin per protocol now.  Monitor for signs/symptoms of bleeding complications. --Serial troponin. --Consider therapeutic thoracentesis with IR in the AM (non-emergent, patient is hemodynamically stable) to rule out exudative effusion and provide symptom relief.  Diabetic foot ulcer, nonhealing lesion to left foot with malodorous, purulent drainage.  Followed by Dr. Gretta Began.   --No signs of sepsis at this point, will hold on antibiotics for now.  Blood cultures for fever greater than 100.4. --Needs Vascular surgery consult in the AM --Wound care consult in AM --Check xray of left foot now --Sed rate and CRP  DM --Continue home dose of lantus.  Moderate SSI coverage  AC/HS.  History of paroxysmal atrial fibrillation. --Coumadin held for GI bleed in February. --On Coreg  Hypothyroidism --Levothyroxine  HLD --Statin  HTN --Controlled with coreg, hydralazine, imdur  History of CAD, prior CVA --Should be on baby aspirin   DVT prophylaxis: Anticoagulated with IV heparin  Code Status: FULL Family Communication: Mother and brother present in the ED at time of admission. Disposition Plan: Expect he will go back to SNF when ready for discharge. Consults called: NONE but he will need cardiology, vascular surgery in the AM Admission status: Inpatient, telemetry   TIME SPENT: 70 minutes   Jerene Bears MD [facility] Pager [phone]  If 7PM-7AM, please contact night-coverage [email or web address] Password [email or web address]  08/02/2016, 8:30 PM

## 2016-08-02 NOTE — Progress Notes (Signed)
ANTICOAGULATION CONSULT NOTE  Pharmacy Consult for heparin Indication: pulmonary embolus  Heparin Dosing Weight: 81.6 kg   Assessment: 70 yom presenting with chest pain. Pharmacy consulted to dose heparin for VTE. D-dimer elevated at 8.12. CTA negative for PE, L atrial appendage thrombus suggested. Noted, patient recently been on/off anticoagulation for afib due to issues with GIBs (previously on warfarin - no current anticoagulation on med rec this admit). INR 1.26 on admit. Hg 9.1, plt 437, troponin negative.  Goal of Therapy:  Heparin level 0.3-0.7 units/ml Monitor platelets by anticoagulation protocol: Yes   Plan:  Heparin 4000 unit bolus (conservative bolus with hx GIBs) Start heparin at 1350 units/h 6h heparin level Daily heparin level/CBC Monitor closely for s/sx bleeding   Ronald Simpson, PharmD, BCPS Clinical Pharmacist 08/02/2016 7:56 PM

## 2016-08-02 NOTE — ED Provider Notes (Signed)
[facility]-EMERGENCY DEPT Provider Note   CSN: [id number] Arrival date & time: 08/02/16  1443     History   Chief Complaint Chief Complaint  Patient presents with  . Chest Pain    HPI Ronald Simpson is a 71 y.o. male.  HPI 71 yo M with PMH of PVD, Hep C, CAD, CKD here with left sided CP. Pt is currently in SNF recovering from left toe amputation 2/2 PVD. He states that upon awakening from rest yesterday evening, he developed a sharp, pleuritic, left sided chest pain. Pain is worse with inspiration and movement. No alleviating factors. It is not reproduced with palpation, however. He has had associated SOB, cough with no sputum production or hemoptysis. No abdominal pain. No LE swelling. No specific alleviating factors. Of note, he has been taken on/off his anticoagulation lately 2/2 difficulty with GI bleeds.  Past Medical History:  Diagnosis Date  . CAD (coronary artery disease)    01/15/16 he states he has had a heart attack  . CVA (cerebral infarction)   . DM (diabetes mellitus), type 2 with peripheral vascular complications (HCC)   . History of hepatitis C 1990s   Hep B immune  . Peripheral vascular disease [facility])     Patient Active Problem List   Diagnosis Date Noted  . Chest pain 08/02/2016  . Pleuritic chest pain 08/02/2016  . Pleural effusion, bilateral 08/02/2016  . Elevated brain natriuretic peptide (BNP) level 08/02/2016  . Chronic anemia 07/22/2016  . Supratherapeutic international normalized ratio (INR) 07/17/2016  . Ascites 06/27/2016  . Chronic anticoagulation   . Cirrhosis (HCC)   . Acute GI bleeding 05/15/2016  . Melanotic stools 05/15/2016  . Altered mental status 04/10/2016  . Gangrene of toe of left foot (HCC)   . Cystitis   . 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 At risk for adverse drug event 02/14/2016  . Peripheral neuropathy 01/31/2016  . Scrotal edema 01/15/2016  . Dry gangrene (HCC) 01/15/2016  . Renal insufficiency  01/15/2016  . PAD (peripheral artery disease) (HCC) 01/11/2016  . 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 Tobacco abuse   . Tachycardia   . Hyponatremia   . Hypoalbuminemia   . MRSA cellulitis of left foot   . Serratia marcescens infection (HCC)   . Escherichia coli infection   . CAD in native artery   . Paroxysmal atrial fibrillation (HCC)   . Uncontrolled type 2 diabetes mellitus with complication (HCC)   . Hepatitis C without hepatic coma 12/27/2015  . Coronary artery disease due to lipid rich plaque 12/27/2015  . Diabetes mellitus with peripheral vascular disease (HCC) 12/27/2015  . History of CVA (cerebrovascular accident) 12/27/2015  . AKI (acute kidney injury) (HCC) 12/27/2015  . Hypothyroidism, acquired 12/27/2015  . Benign essential HTN 12/27/2015  . Sepsis (HCC) 12/26/2015    Past Surgical History:  Procedure Laterality Date  . AMPUTATION Right 01/07/2016   Procedure: AMPUTATION ABOVE KNEE;  Surgeon: Larina Earthly, MD;  Location: [facility] OR;  Service: Vascular;  Laterality: Right;  . AMPUTATION Left 04/04/2016   Procedure: LEFT FOURTH AND FIFTH  TOE AMPUTATION;  Surgeon: Larina Earthly, MD;  Location: [facility] OR;  Service: Vascular;  Laterality: Left;  . BELOW KNEE LEG AMPUTATION Right   . CORONARY ARTERY BYPASS GRAFT  2005  . ESOPHAGOGASTRODUODENOSCOPY N/A 05/17/2016   Procedure: ESOPHAGOGASTRODUODENOSCOPY (EGD);  Surgeon: Meryl Dare, MD;  Location: [facility] ENDOSCOPY;  Service:  Endoscopy;  Laterality: N/A;  . IR GENERIC HISTORICAL  07/07/2016   IR PARACENTESIS 07/07/2016 Gershon Crane, PA-C [facility]-INTERV RAD  . PERIPHERAL VASCULAR CATHETERIZATION N/A 01/11/2016   Procedure: Lower Extremity Angiography;  Surgeon: Nada Libman, MD;  Location: [facility] INVASIVE CV LAB;  Service: Cardiovascular;  Laterality: N/A;  . STERNOTOMY         Home Medications    Prior to Admission medications   Medication Sig Start Date End Date  Taking? Authorizing Provider  acetaminophen (TYLENOL) 325 MG tablet Take 325 mg by mouth every 6 (six) hours as needed.    Yes Historical Provider, MD  atorvastatin (LIPITOR) 20 MG tablet Take 20 mg by mouth daily.   Yes Historical Provider, MD  carvedilol (COREG) 6.25 MG tablet Take 6.25 mg by mouth 2 (two) times daily with a meal.   Yes Historical Provider, MD  docusate sodium (COLACE) 100 MG capsule Take 100 mg by mouth 2 (two) times daily.   Yes Historical Provider, MD  DULoxetine (CYMBALTA) 30 MG capsule Take 30 mg by mouth daily.   Yes Historical Provider, MD  ferrous sulfate 325 (65 FE) MG tablet Take 325 mg by mouth 2 (two) times daily with a meal.   Yes Historical Provider, MD  furosemide (LASIX) 40 MG tablet Take 40 mg by mouth daily.    Yes Historical Provider, MD  hydrALAZINE (APRESOLINE) 50 MG tablet Take 50 mg by mouth 3 (three) times daily.   Yes Historical Provider, MD  insulin glargine (LANTUS) 100 unit/mL SOPN Inject 17 Units into the skin at bedtime.   Yes Historical Provider, MD  isosorbide mononitrate (IMDUR) 30 MG 24 hr tablet Take 1 tablet (30 mg total) by mouth daily. 01/14/16  Yes Jeralyn Bennett, MD  levothyroxine (SYNTHROID, LEVOTHROID) 25 MCG tablet Take 25 mcg by mouth daily before breakfast.   Yes Historical Provider, MD  pantoprazole (PROTONIX) 40 MG tablet Take 1 tablet (40 mg total) by mouth daily. 05/22/16  Yes Rodolph Bong, MD  phenazopyridine (PYRIDIUM) 100 MG tablet Take 100 mg by mouth 3 (three) times daily as needed for pain.   Yes Historical Provider, MD  polyethylene glycol (MIRALAX / GLYCOLAX) packet Take 17 g by mouth daily.   Yes Historical Provider, MD  Amino Acids-Protein Hydrolys (FEEDING SUPPLEMENT, PRO-STAT SUGAR FREE 64,) LIQD Take 30 mLs by mouth 2 (two) times daily.    Historical Provider, MD  amitriptyline (ELAVIL) 50 MG tablet Take 50 mg by mouth at bedtime.    Historical Provider, MD  barrier cream (NON-SPECIFIED) CREA Apply 1 application  topically 2 (two) times daily.    Historical Provider, MD  ipratropium-albuterol (DUONEB) 0.5-2.5 (3) MG/3ML SOLN Take 3 mLs by nebulization every 6 (six) hours as needed (shortness of breath).     Historical Provider, MD  nitroGLYCERIN (NITROSTAT) 0.4 MG SL tablet Place 0.4 mg under the tongue every 5 (five) minutes as needed for chest pain.    Historical Provider, MD  OXYGEN Inhale into the lungs. 2lpm via [place] prn for shortness of breath or O2 sat <90%    Historical Provider, MD    Family History Family History  Problem Relation Age of Onset  . Hypertension Other   . Diabetes Other   . Mental illness Other   . Lupus Other   . Diabetes Father   . Cancer Neg Hx   . Heart disease Neg Hx   . Stroke Neg Hx     Social History Social History  Substance Use Topics  . Smoking status: Current Every Day Smoker    Packs/day: 0.10    Years: 54.00    Types: Cigarettes  . Smokeless tobacco: Never Used  . Alcohol use Yes     Comment: 04/02/2016 "aint suppose to drink in the nursing home"     Allergies   Percocet [oxycodone-acetaminophen] and Tramadol   Review of Systems Review of Systems  Constitutional: Positive for fatigue. Negative for chills and fever.  HENT: Negative for congestion and rhinorrhea.   Eyes: Negative for visual disturbance.  Respiratory: Positive for cough, chest tightness and shortness of breath. Negative for wheezing.   Cardiovascular: Positive for chest pain. Negative for leg swelling.  Gastrointestinal: Negative for abdominal pain, diarrhea, nausea and vomiting.  Genitourinary: Negative for dysuria and flank pain.  Musculoskeletal: Negative for neck pain and neck stiffness.  Skin: Negative for rash and wound.  Allergic/Immunologic: Negative for immunocompromised state.  Neurological: Negative for syncope, weakness and headaches.  All other systems reviewed and are negative.    Physical Exam Updated Vital Signs BP 134/72   Pulse 92   Temp 97.6 F (36.4  C) (Oral)   Resp 18   Ht  (1.727 m)   Wt 180 lb (81.6 kg)   SpO2 100%   BMI 27.37 kg/m   Physical Exam  Constitutional: He is oriented to person, place, and time. He appears well-developed and well-nourished. No distress.  HENT:  Head: Normocephalic and atraumatic.  Eyes: Conjunctivae are normal.  Neck: Neck supple.  Cardiovascular: Normal rate, regular rhythm and normal heart sounds.  Exam reveals no friction rub.   No murmur heard. Pulmonary/Chest: Effort normal and breath sounds normal. No respiratory distress. He has no wheezes. He has no rales. He exhibits no tenderness (no specific pinpoint chest wall TTP; no deformity or swelling).  Abdominal: Soft. He exhibits no distension.  Musculoskeletal: He exhibits no edema.  LLE with purulent drainage over non-healing diabetic ulceration. No fluctuance.  Neurological: He is alert and oriented to person, place, and time. He exhibits normal muscle tone.  Skin: Skin is warm. Capillary refill takes less than 2 seconds.  Psychiatric: He has a normal mood and affect.  Nursing note and vitals reviewed.    ED Treatments / Results  Labs (all labs ordered are listed, but only abnormal results are displayed) Labs Reviewed  CBC - Abnormal; Notable for the following:       Result Value   RBC 3.44 (*)    Hemoglobin 9.1 (*)    HCT 30.0 (*)    RDW 18.1 (*)    Platelets 437 (*)    All other components within normal limits  COMPREHENSIVE METABOLIC PANEL - Abnormal; Notable for the following:    Sodium 134 (*)    Chloride 99 (*)    Glucose, Bld 121 (*)    BUN 24 (*)    Calcium 8.6 (*)    Albumin 2.4 (*)    ALT 8 (*)    Alkaline Phosphatase 141 (*)    Total Bilirubin 1.6 (*)    All other components within normal limits  BRAIN NATRIURETIC PEPTIDE - Abnormal; Notable for the following:    B Natriuretic Peptide 1,021.2 (*)    All other components within normal limits  D-DIMER, QUANTITATIVE (NOT AT [facility]) - Abnormal; Notable for the  following:    D-Dimer, Quant 8.12 (*)    All other components within normal limits  PROTIME-INR - Abnormal; Notable for the following:  Prothrombin Time 15.9 (*)    All other components within normal limits  SEDIMENTATION RATE - Abnormal; Notable for the following:    Sed Rate 75 (*)    All other components within normal limits  C-REACTIVE PROTEIN - Abnormal; Notable for the following:    CRP 8.9 (*)    All other components within normal limits  PREALBUMIN - Abnormal; Notable for the following:    Prealbumin 5.6 (*)    All other components within normal limits  GLUCOSE, CAPILLARY - Abnormal; Notable for the following:    Glucose-Capillary 109 (*)    All other components within normal limits  MRSA PCR SCREENING  TROPONIN I  HEPARIN LEVEL (UNFRACTIONATED)  CBC  BASIC METABOLIC PANEL  TROPONIN I  I-STAT TROPOININ, ED  I-STAT CG4 LACTIC ACID, ED    EKG  EKG Interpretation  Date/Time:  Saturday August 02 2016 14:47:30 EDT Ventricular Rate:  93 PR Interval:    QRS Duration: 126 QT Interval:  360 QTC Calculation: 448 R Axis:   18 Text Interpretation:  Sinus rhythm Multiple ventricular premature complexes Nonspecific intraventricular conduction delay Borderline repolarization abnormality Since last EKG PVCs are now new ST depression in anterior leads mildly more prominent Confirmed by Buel Molder MD, Sheria Lang [phone]) on 08/02/2016 2:53:30 PM Also confirmed by Erma Heritage MD, Wendal Wilkie [phone]), editor [place] CT, Jola Babinski [phone])  on 08/02/2016 3:00:52 PM       Radiology Dg Chest 2 View  Result Date: 08/02/2016 CLINICAL DATA:  Left-sided chest pain beginning last night. EXAM: CHEST  2 VIEW COMPARISON:  04/02/2016 FINDINGS: Sequelae of prior CABG and mitral valve repair are again identified. The cardiac silhouette remains mildly enlarged. Lung volumes are diminished with mild pulmonary vascular congestion. There are moderate right and small left pleural effusions with patchy bibasilar airspace  opacities bilaterally. No acute osseous abnormality is seen. IMPRESSION: 1. Moderate right and small left pleural effusions. 2. Cardiomegaly and pulmonary vascular congestion. Bibasilar opacities may reflect edema, pneumonia, or atelectasis. Electronically Signed   By: Sebastian Ache M.D.   On: 08/02/2016 15:18   Ct Angio Chest Pe W Or Wo Contrast  Result Date: 08/02/2016 CLINICAL DATA:  71 year old male with history of shortness of breath and left-sided chest pain while breathing. EXAM: CT ANGIOGRAPHY CHEST WITH CONTRAST TECHNIQUE: Multidetector CT imaging of the chest was performed using the standard protocol during bolus administration of intravenous contrast. Multiplanar CT image reconstructions and MIPs were obtained to evaluate the vascular anatomy. CONTRAST:  80 mL of Isovue 370. COMPARISON:  No priors. FINDINGS: Cardiovascular: No filling defects within the pulmonary arterial tree to suggest underlying pulmonary embolism. Heart size is mildly enlarge. There is no significant pericardial fluid, thickening or pericardial calcification. There is aortic atherosclerosis, as well as atherosclerosis of the great vessels of the mediastinum and the coronary arteries, including calcified atherosclerotic plaque in the left main, left anterior descending, left circumflex and right coronary arteries. Status post median sternotomy for CABG, including LIMA to the LAD, as well as mitral annuloplasty. Left atrial appendage does not fill with contrast, concerning for thrombus. Mediastinum/Nodes: No pathologically enlarged mediastinal or hilar lymph nodes. Esophagus is unremarkable in appearance. No axillary lymphadenopathy. Lungs/Pleura: Large right and moderate left pleural effusions with extensive passive atelectasis throughout the dependent portions of the lungs bilaterally. Right lower lobe is completely collapsed, as is much of the right middle lobe. No definite consolidative airspace disease. Within the aerated  portions of the lungs there are no definite suspicious appearing pulmonary nodules  or masses. Upper Abdomen: Aortic atherosclerosis. Musculoskeletal: Median sternotomy wires. There are no aggressive appearing lytic or blastic lesions noted in the visualized portions of the skeleton. Review of the MIP images confirms the above findings. IMPRESSION: 1. No evidence of pulmonary embolism. 2. Left atrial appendage does not fill with contrast, suggesting thrombus. This places the patient at risk for systemic embolization. Further evaluation with transesophageal echocardiography is suggested if clinically appropriate. 3. Large right and moderate left pleural effusions with extensive passive atelectasis in the dependent portions of the lungs bilaterally, as above. 4. Aortic atherosclerosis, in addition to left main and 3 vessel coronary artery disease. Status post median sternotomy for CABG including LIMA to the LAD. Electronically Signed   By: Trudie Reed M.D.   On: 08/02/2016 19:07   Dg Foot 2 Views Left  Result Date: 08/02/2016 CLINICAL DATA:  Acute onset of soft tissue swelling and erythema at the left foot. Assess for underlying abscess. Initial encounter. EXAM: LEFT FOOT - 2 VIEW COMPARISON:  CT of the left foot performed 04/03/2016 FINDINGS: Since the prior CT, the patient is status post resection at the fourth and fifth mid metatarsals. Mild overlying soft tissue swelling is suggested. Underlying residual osseous erosion cannot be excluded, as the resection sites are somewhat irregular in appearance, but this is difficult to fully assess on radiograph. Diffuse vascular calcifications are seen. Scattered metallic BBs are noted within the ankle and foot. There is no evidence of acute fracture or dislocation. IMPRESSION: Note that evaluation for abscess is very limited on radiograph. Mild soft tissue swelling noted at the prior resection site at the fourth and fifth mid metatarsals. Underlying residual osseous  erosion cannot be excluded, as the resection sites are somewhat irregular in appearance, but this is not well assessed on radiograph. Ultrasound could be considered to evaluate for abscess. Alternatively, contrast-enhanced CT or three-phase bone scan could be considered to assess for osteomyelitis, depending on the degree of clinical concern. Electronically Signed   By: Roanna Raider M.D.   On: 08/02/2016 21:40    Procedures Procedures (including critical care time)  Medications Ordered in ED Medications  heparin ADULT infusion 100 units/mL (25000 units/227mL sodium chloride 0.45%) (1,350 Units/hr Intravenous New Bag/Given 08/02/16 2042)  hydrALAZINE (APRESOLINE) tablet 50 mg (50 mg Oral Given 08/02/16 2344)  polyethylene glycol (MIRALAX / GLYCOLAX) packet 17 g (not administered)  atorvastatin (LIPITOR) tablet 20 mg (20 mg Oral Given 08/02/16 2145)  docusate sodium (COLACE) capsule 100 mg (100 mg Oral Given 08/02/16 2344)  pantoprazole (PROTONIX) EC tablet 40 mg (not administered)  DULoxetine (CYMBALTA) DR capsule 30 mg (30 mg Oral Given 08/02/16 2145)  ipratropium-albuterol (DUONEB) 0.5-2.5 (3) MG/3ML nebulizer solution 3 mL (not administered)  carvedilol (COREG) tablet 6.25 mg (not administered)  ferrous sulfate tablet 325 mg (not administered)  insulin glargine (LANTUS) injection 17 Units (17 Units Subcutaneous Given 08/02/16 2344)  isosorbide mononitrate (IMDUR) 24 hr tablet 30 mg (not administered)  levothyroxine (SYNTHROID, LEVOTHROID) tablet 25 mcg (not administered)  nitroGLYCERIN (NITROSTAT) SL tablet 0.4 mg (not administered)  acetaminophen (TYLENOL) tablet 650 mg (not administered)    Or  acetaminophen (TYLENOL) suppository 650 mg (not administered)  ondansetron (ZOFRAN) tablet 4 mg (not administered)    Or  ondansetron (ZOFRAN) injection 4 mg (not administered)  insulin aspart (novoLOG) injection 0-15 Units (not administered)  insulin aspart (novoLOG) injection 0-5 Units (0 Units  Subcutaneous Not Given 08/02/16 2200)  furosemide (LASIX) injection 40 mg (not administered)  HYDROcodone-acetaminophen (NORCO/VICODIN) 5-325  MG per tablet 1 tablet (1 tablet Oral Given 08/02/16 2105)  aspirin EC tablet 81 mg (not administered)  fentaNYL (SUBLIMAZE) injection 12.5 mcg (12.5 mcg Intravenous Given 08/02/16 1621)  iopamidol (ISOVUE-370) 76 % injection (100 mLs  Contrast Given 08/02/16 1844)  furosemide (LASIX) injection 40 mg (40 mg Intravenous Given 08/02/16 2030)  heparin bolus via infusion 4,000 Units (4,000 Units Intravenous Bolus from Bag 08/02/16 2037)     Initial Impression / Assessment and Plan / ED Course  I have reviewed the triage vital signs and the nursing notes.  Pertinent labs & imaging results that were available during my care of the patient were reviewed by me and considered in my medical decision making (see chart for details).     71 yo M with extensive PMHx as above including recent toe amputation, s/p right aka, here with pleuritic CP. CXR c/f bilateral pleural effusions and bibasilar opacities. EKG shows chronic, but mildly worsened ST depressions in anterior leads. DDx broad and includes HAP 2/2 recent procedure with immobilization, must also consider PE. D-Dimer ordered for risk stratification, though this may be difficult to assess in setting of chronic illness and liver disease but I am hesitant to scan based on h/o abnormal renal function. Fentanyl for pain given h/o over sedation with other meds. He is not hypoxic, breath sounds symmetric and equal b/l.  Labs, imaging as above. He has markedly elevated BNP and CT is c/f significant bilateral effusions consistent with hypervolemia and fluid overload, which may be causing his pleurisy. Of note, no PE but pt does have possible atrial thrombus on CT - d/w Hospitalist, will start on heparin gtt. Otherwise, no signs of sepsis on lab work but will need foot evaluated by vascular. Pt ow HDS. IV lasix given. Admit to  hospitalist.  Final Clinical Impressions(s) / ED Diagnoses   Final diagnoses:  Chest pain  Elevated brain natriuretic peptide (BNP) level  Other hypervolemia  Pleurisy  Diabetic ulcer of toe of left foot associated with type 2 diabetes mellitus, with other ulcer severity [facility])    New Prescriptions Current Discharge Medication List       Shaune Pollack, MD 08/03/16 [phone]

## 2016-08-02 NOTE — ED Notes (Signed)
Patient transported to CT 

## 2016-08-02 NOTE — ED Notes (Signed)
New IV started due to clotting in IV tubing.

## 2016-08-02 NOTE — ED Notes (Signed)
Patient transported to X-ray 

## 2016-08-03 ENCOUNTER — Other Ambulatory Visit (HOSPITAL_COMMUNITY): Payer: Medicare Other

## 2016-08-03 ENCOUNTER — Inpatient Hospital Stay (HOSPITAL_COMMUNITY): Payer: Medicare Other

## 2016-08-03 DIAGNOSIS — D649 Anemia, unspecified: Secondary | ICD-10-CM

## 2016-08-03 DIAGNOSIS — E039 Hypothyroidism, unspecified: Secondary | ICD-10-CM

## 2016-08-03 DIAGNOSIS — I509 Heart failure, unspecified: Secondary | ICD-10-CM

## 2016-08-03 DIAGNOSIS — J90 Pleural effusion, not elsewhere classified: Secondary | ICD-10-CM

## 2016-08-03 DIAGNOSIS — I480 Paroxysmal atrial fibrillation: Secondary | ICD-10-CM

## 2016-08-03 DIAGNOSIS — Z8673 Personal history of transient ischemic attack (TIA), and cerebral infarction without residual deficits: Secondary | ICD-10-CM

## 2016-08-03 DIAGNOSIS — R7989 Other specified abnormal findings of blood chemistry: Secondary | ICD-10-CM

## 2016-08-03 DIAGNOSIS — L97528 Non-pressure chronic ulcer of other part of left foot with other specified severity: Secondary | ICD-10-CM

## 2016-08-03 DIAGNOSIS — R079 Chest pain, unspecified: Secondary | ICD-10-CM

## 2016-08-03 DIAGNOSIS — R0781 Pleurodynia: Secondary | ICD-10-CM

## 2016-08-03 LAB — CBC
HCT: 30.4 % — ABNORMAL LOW (ref 39.0–52.0)
HEMOGLOBIN: 9.1 g/dL — AB (ref 13.0–17.0)
MCH: 26.4 pg (ref 26.0–34.0)
MCHC: 29.9 g/dL — ABNORMAL LOW (ref 30.0–36.0)
MCV: 88.1 fL (ref 78.0–100.0)
Platelets: 392 10*3/uL (ref 150–400)
RBC: 3.45 MIL/uL — ABNORMAL LOW (ref 4.22–5.81)
RDW: 18.1 % — ABNORMAL HIGH (ref 11.5–15.5)
WBC: 6.4 10*3/uL (ref 4.0–10.5)

## 2016-08-03 LAB — GLUCOSE, CAPILLARY
GLUCOSE-CAPILLARY: 138 mg/dL — AB (ref 65–99)
GLUCOSE-CAPILLARY: 181 mg/dL — AB (ref 65–99)
Glucose-Capillary: 106 mg/dL — ABNORMAL HIGH (ref 65–99)
Glucose-Capillary: 139 mg/dL — ABNORMAL HIGH (ref 65–99)

## 2016-08-03 LAB — TROPONIN I: Troponin I: <0.03 ng/mL (ref <0.03)

## 2016-08-03 LAB — ECHOCARDIOGRAM COMPLETE
CHL CUP DOP CALC LVOT VTI: 13.9 cm
CHL CUP REG VEL DIAS: 124 cm/s
CHL CUP RV SYS PRESS: 43 mmHg
CHL CUP TV REG PEAK VELOCITY: 315 cm/s
FS: 7 % — AB (ref 28–44)
HEIGHTINCHES: 68 in
IV/PV OW: 1.02
LA diam index: 2.54 cm/m2
LA vol A4C: 61.2 ml
LA vol: 71.1 mL
LASIZE: 48 mm
LAVOLIN: 37.6 mL/m2
LEFT ATRIUM END SYS DIAM: 48 mm
LVOT area: 3.14 cm2
LVOT diameter: 20 mm
LVOT peak grad rest: 3 mmHg
LVOT peak vel: 89.7 cm/s
LVOTSV: 44 mL
Lateral S' vel: 6.53 cm/s
PV Reg grad dias: 6 mmHg
PW: 12.5 mm — AB (ref 0.6–1.1)
TAPSE: 13.4 mm
TRMAXVEL: 315 cm/s
WEIGHTICAEL: 2652.57 [oz_av]

## 2016-08-03 LAB — BASIC METABOLIC PANEL
ANION GAP: 9 (ref 5–15)
BUN: 25 mg/dL — ABNORMAL HIGH (ref 6–20)
CHLORIDE: 98 mmol/L — AB (ref 101–111)
CO2: 26 mmol/L (ref 22–32)
Calcium: 8.5 mg/dL — ABNORMAL LOW (ref 8.9–10.3)
Creatinine, Ser: 1.14 mg/dL (ref 0.61–1.24)
GFR calc Af Amer: >60 mL/min (ref >60)
GFR calc non Af Amer: >60 mL/min (ref >60)
GLUCOSE: 152 mg/dL — AB (ref 65–99)
Potassium: 4.2 mmol/L (ref 3.5–5.1)
Sodium: 133 mmol/L — ABNORMAL LOW (ref 135–145)

## 2016-08-03 LAB — MRSA PCR SCREENING: MRSA BY PCR: POSITIVE — AB

## 2016-08-03 LAB — HEPARIN LEVEL (UNFRACTIONATED): Heparin Unfractionated: 0.25 IU/mL — ABNORMAL LOW (ref 0.30–0.70)

## 2016-08-03 MED ORDER — HEPARIN SODIUM (PORCINE) 5000 UNIT/ML IJ SOLN
5000.0000 [IU] | Freq: Three times a day (TID) | INTRAMUSCULAR | Status: DC
  Administered 2016-08-03 – 2016-08-07 (×13): 5000 [IU] via SUBCUTANEOUS
  Filled 2016-08-03 (×13): qty 1

## 2016-08-03 MED ORDER — ENSURE ENLIVE PO LIQD
237.0000 mL | Freq: Two times a day (BID) | ORAL | Status: DC
  Administered 2016-08-04 – 2016-08-07 (×6): 237 mL via ORAL

## 2016-08-03 MED ORDER — FUROSEMIDE 10 MG/ML IJ SOLN
80.0000 mg | Freq: Two times a day (BID) | INTRAMUSCULAR | Status: DC
  Administered 2016-08-03 – 2016-08-04 (×3): 80 mg via INTRAVENOUS
  Filled 2016-08-03 (×4): qty 8

## 2016-08-03 NOTE — Progress Notes (Signed)
  Echocardiogram 2D Echocardiogram has been performed.  Ronald Simpson [name] 08/03/2016, 12:38 PM

## 2016-08-03 NOTE — Progress Notes (Signed)
ANTICOAGULATION CONSULT NOTE - Follow Up Consult  Pharmacy Consult for Heparin Indication: L atrial appendage concerning for thrombus on CTA  Allergies  Allergen Reactions  . Percocet [Oxycodone-Acetaminophen]     Needing narcan on multiple occasions due to oversedation after getting percocet   . Tramadol     Excess sedation; ? Related to pre-existing hepatic dysfunction with PMH Hep C    Patient Measurements: Height:  (172.7 cm) Weight: 180 lb (81.6 kg) IBW/kg (Calculated) : 68.4 Heparin Dosing Weight: 81 kg  Vital Signs: Temp: 97.6 F (36.4 C) (04/21 2206) Temp Source: Oral (04/21 2206) BP: 134/72 (04/21 2000) Pulse Rate: 92 (04/21 2206)  Labs:  Recent Labs  08/02/16 1610 08/02/16 2050 08/03/16 0312  HGB 9.1*  --  9.1*  HCT 30.0*  --  30.4*  PLT 437*  --  392  LABPROT 15.9*  --   --   INR 1.26  --   --   HEPARINUNFRC  --   --  0.25*  CREATININE 1.09  --   --   TROPONINI  --  <0.03  --     Estimated Creatinine Clearance: 62.8 mL/min (by C-G formula based on SCr of 1.09 mg/dL).   Assessment: 70 yom on heparin for L atrial appendage concerning for thrombus on CTA. Noted, patient recently been on/off anticoagulation for afib due to issues with GIBs (previously on warfarin - no current anticoagulation on med rec this admit). Heparin level 0.25 (subtherapeutic) on gtt at 1350 units/hr. No issues with line or bleeding reported per RN. CBC stable.  Goal of Therapy:  Heparin level 0.3-0.7 units/ml Monitor platelets by anticoagulation protocol: Yes   Plan:  Increase heparin to 1600 units/hr Will f/u 6 hr heparin level  Christoper Fabian, PharmD, BCPS Clinical pharmacist, pager [phone] 08/03/2016,4:04 AM
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
## 2016-08-03 NOTE — Progress Notes (Signed)
Inpatient Diabetes Program Recommendations  AACE/ADA: New Consensus Statement on Inpatient Glycemic Control (2015)  Target Ranges:  Prepandial:   less than 140 mg/dL      Peak postprandial:   less than 180 mg/dL (1-2 hours)      Critically ill patients:  140 - 180 mg/dL  Results for Ronald Simpson, Ronald Simpson (MRN [id number]) as of 08/03/2016 16:30  Ref. Range 08/02/2016 22:09 08/03/2016 07:42 08/03/2016 11:36  Glucose-Capillary Latest Ref Range: 65 - 99 mg/dL 130 (H) 865 (H) 784 (H)    Review of Glycemic Control  Diabetes history: DM2 Outpatient Diabetes medications: Lantus 17 units QHS Current orders for Inpatient glycemic control: Lantus 17 units QHS, Novolog 0-15 units TID with meals, Novolog 0-5 units QHS   NOTE: Consult for Diabetes Coordinator noted. Chart reviewed and agree with current inpatient glycemic control orders. Will continue to follow along.  Thanks, Orlando Penner, RN, MSN, CDE Diabetes Coordinator Inpatient Diabetes Program [phone] (Team Pager from 8am to 5pm)

## 2016-08-03 NOTE — Progress Notes (Signed)
PROGRESS NOTE    Ronald Simpson  WUJ:[id number] DOB: Sep 27, 1945 DOA: 08/02/2016 PCP: Marga Melnick, MD   Chief Complaint  Patient presents with  . Chest Pain    Brief Narrative:  HPI on 08/02/2016 by Dr. Michael Litter [name] is a 71 y.o. 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  Onset around 1AM this morning while at rest.  Associated with DOE but he is not particularly short of breath at rest.  No associated nausea, vomiting, or diaphoresis.  Was initially 8 out of 10 in intensity.  Resolved after four baby aspirin and 3 SL nitro with EMS.  Interim history Patient found to have bilateral pleural effusions, right worse than left. IR consulted for thoracentesis. Question of atrial appendage thrombus noted on CT scan. Patient was placed on IV heparin. Echocardiogram obtained. We'll continue to monitor patient's fluid status and give IV fluids. Cardiology consultation appreciated.  Assessment & Plan   Pleuritic chest pain with bilateral effusions -CTA chest: No pulmonary embolism, left atrial appendage does not fill with contrast suggesting thrombus. Large right and moderate left pleural effusions. 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 Sampson Goon, feels that this is likely secondary to clipping of an appendage at the time of CABG, would not workup further  Proximal atrial fibrillation -Currently in sinus rhythm -Patient has been on Coumadin in the past however this was held due to history of GI bleed and intracranial hemorrhage.  Essential hypertension -Continue Coreg, Lasix, Imdur  Coronary artery disease -s/p CABG -Does complain of chest pain however per day can nature. See discussion above -Continue Imdur, Coreg, statin, aspirin   Diabetes mellitus, type II -Continue Lantus, insulin sliding scale CBG monitoring  Nonhealing diabetic left foot ulcer -Patient follows with vascular surgery, Dr. Arbie Cookey  Chronic normocytic anemia -Hemoglobin currently 9.1, appears to be at baseline between 8-9 -Continue to monitor CBC  Hyperlipidemia -Continue statin  Cirrhosis/hepatitis C -LFTs normal -Has had paracentesis in the past  Hypothyroidism -Continue Synthroid  Depression -Continue Cymbalta   DVT Prophylaxis  heparin   Code Status: Full   Family Communication: None at bedside   Disposition Plan: Admitted, pending thoracentesis and improvement in volume status   Consultants Cardiology Interventional radiology   Procedures  Echocardiogram   Antibiotics   Anti-infectives    None      Subjective:   [name] seen and examined today.  Patient continues to complain of left-sided pain which is worse with inspiration. Denies any recent trauma or falls. Denies current chest pain, abdominal pain, nausea or vomiting, diarrhea or constipation.    Objective:   Vitals:   08/02/16 2200 08/02/16 2206 08/03/16 0607 08/03/16 0943  BP:   (!) 150/85 (!) 149/89  Pulse: 92 92  91 90  Resp: (!) Temp:  97.6 F (36.4 C) 98 F (36.7 C)   TempSrc:  Oral Oral   SpO2:  100% 100% 95% 100%  Weight:  81.6 kg (180 lb) 75.2 kg (165 lb 12.6 oz)   Height:   (1.727 m)  (1.727 m)     Intake/Output Summary (Last 24 hours) at 08/03/16 1249 Last data filed at 08/03/16 0932  Gross per 24 hour  Intake           691.35 ml  Output              450 ml  Net           241.35 ml   Filed Weights   08/02/16 1446 08/02/16 2206 08/03/16 0607  Weight: 81.6 kg (180 lb) 81.6 kg (180 lb) 75.2 kg (165 lb 12.6 oz)    Exam  General: Well developed, chronically ill appearing, NAD  HEENT: NCAT, mucous membranes moist.   Neck: Supple, + JVD, no masses  Cardiovascular: S1 S2 auscultated, no rubs, murmurs or gallops. Regular rate and rhythm.  Respiratory: Diminished breath sounds, more so on the right   Abdomen: Soft, nontender, nondistended, + bowel sounds, +ascites  Extremities: Right AKA, LLE with +1 edema and ucler  Neuro: AAOx3, nonfocal  Psych: Normal affect and demeanor with intact judgement and insight   Data Reviewed: I have personally reviewed following labs and imaging studies  CBC:  Recent Labs Lab 08/02/16 1610 08/03/16 0312  WBC 8.0 6.4  HGB 9.1* 9.1*  HCT 30.0* 30.4*  MCV 87.2 88.1  PLT 437* 392   Basic Metabolic Panel:  Recent Labs Lab 08/02/16 1610 08/03/16 0312  NA 134* 133*  K 4.4 4.2  CL 99* 98*  CO2 27 26  GLUCOSE 121* 152*  BUN 24* 25*  CREATININE 1.09 1.14  CALCIUM 8.6* 8.5*   GFR: Estimated Creatinine Clearance: 60 mL/min (by C-G formula based on SCr of 1.14 mg/dL). Liver Function Tests:  Recent Labs Lab 08/02/16 1610  AST 19  ALT 8*  ALKPHOS 141*  BILITOT 1.6*  PROT 7.8  ALBUMIN 2.4*   No results for input(s): LIPASE, AMYLASE in the last 168 hours. No results for input(s): AMMONIA in the last 168 hours. Coagulation Profile:  Recent Labs Lab 08/02/16 1610  INR 1.26   Cardiac Enzymes:  Recent Labs Lab 08/02/16 2050 08/03/16 0312  TROPONINI <0.03 <0.03   BNP (last 3 results) No results  for input(s): PROBNP in the last 8760 hours. HbA1C: No results for input(s): HGBA1C in the last 72 hours. CBG:  Recent Labs Lab 08/02/16 2209 08/03/16 0742 08/03/16 1136  GLUCAP 109* 138* 106*   Lipid Profile: No results for input(s): CHOL, HDL, LDLCALC, TRIG, CHOLHDL, LDLDIRECT in the last 72 hours. Thyroid Function Tests: No results for input(s): TSH, T4TOTAL, FREET4, T3FREE, THYROIDAB in the last 72 hours. Anemia Panel: No results for input(s): VITAMINB12, FOLATE, FERRITIN, TIBC, IRON, RETICCTPCT in the last 72 hours. Urine analysis:    Component Value Date/Time   COLORURINE AMBER (A) 04/05/2016 1100   APPEARANCEUR CLOUDY (A) 04/05/2016 1100   LABSPEC 1.029 04/05/2016 1100   PHURINE 5.0 04/05/2016 1100   GLUCOSEU NEGATIVE 04/05/2016 1100   HGBUR SMALL (A) 04/05/2016 1100   BILIRUBINUR NEGATIVE 04/05/2016 1100   KETONESUR NEGATIVE 04/05/2016 1100   PROTEINUR 100 (A) 04/05/2016 1100   NITRITE NEGATIVE 04/05/2016 1100   LEUKOCYTESUR MODERATE (A) 04/05/2016 1100  Sepsis Labs: (procalcitonin:4,lacticidven:4)  ) Recent Results (from the past 240 hour(s))  MRSA PCR Screening     Status: Abnormal   Collection Time: 08/03/16  1:22 AM  Result Value Ref Range Status   MRSA by PCR POSITIVE (A) NEGATIVE Final    Comment:        The GeneXpert MRSA Assay (FDA approved for NASAL specimens only), is one component of a comprehensive MRSA colonization surveillance program. It is not intended to diagnose MRSA infection nor to guide or monitor treatment for MRSA infections. RESULT CALLED TO, READ BACK BY AND VERIFIED WITH: MENDEZ,V RN [phone] AT [phone] [facility]       Radiology Studies: Dg Chest 2 View  Result Date: 08/02/2016 CLINICAL DATA:  Left-sided chest pain beginning last night. EXAM: CHEST  2 VIEW COMPARISON:  04/02/2016 FINDINGS: Sequelae of prior CABG and mitral valve repair are again identified. The cardiac silhouette remains mildly enlarged. Lung volumes  are diminished with mild pulmonary vascular congestion. There are moderate right and small left pleural effusions with patchy bibasilar airspace opacities bilaterally. No acute osseous abnormality is seen. IMPRESSION: 1. Moderate right and small left pleural effusions. 2. Cardiomegaly and pulmonary vascular congestion. Bibasilar opacities may reflect edema, pneumonia, or atelectasis. Electronically Signed   By: Sebastian Ache M.D.   On: 08/02/2016 15:18   Ct Angio Chest Pe W Or Wo Contrast  Result Date: 08/02/2016 CLINICAL DATA:  71 year old male with history of shortness of breath and left-sided chest pain while breathing. EXAM: CT ANGIOGRAPHY CHEST WITH CONTRAST TECHNIQUE: Multidetector CT imaging of the chest was performed using the standard protocol during bolus administration of intravenous contrast. Multiplanar CT image reconstructions and MIPs were obtained to evaluate the vascular anatomy. CONTRAST:  80 mL of Isovue 370. COMPARISON:  No priors. FINDINGS: Cardiovascular: No filling defects within the pulmonary arterial tree to suggest underlying pulmonary embolism. Heart size is mildly enlarge. There is no significant pericardial fluid, thickening or pericardial calcification. There is aortic atherosclerosis, as well as atherosclerosis of the great vessels of the mediastinum and the coronary arteries, including calcified atherosclerotic plaque in the left main, left anterior descending, left circumflex and right coronary arteries. Status post median sternotomy for CABG, including LIMA to the LAD, as well as mitral annuloplasty. Left atrial appendage does not fill with contrast, concerning for thrombus. Mediastinum/Nodes: No pathologically enlarged mediastinal or hilar lymph nodes. Esophagus is unremarkable in appearance. No axillary lymphadenopathy. Lungs/Pleura: Large right and moderate left pleural effusions with extensive passive atelectasis throughout the dependent portions of the lungs bilaterally.  Right lower lobe is completely collapsed, as is much of the right middle lobe. No definite consolidative airspace disease. Within the aerated portions of the lungs there are no definite suspicious appearing pulmonary nodules or masses. Upper Abdomen: Aortic atherosclerosis. Musculoskeletal: Median sternotomy wires. There are no aggressive appearing lytic or blastic lesions noted in the visualized portions of the skeleton. Review of the MIP images confirms the above findings. IMPRESSION: 1. No evidence of pulmonary embolism. 2. Left atrial appendage does not fill with contrast, suggesting thrombus. This places the patient at risk for systemic embolization. Further evaluation with transesophageal echocardiography is suggested if clinically appropriate. 3. Large right and moderate left pleural effusions with extensive passive atelectasis in the dependent portions of the lungs bilaterally, as above. 4. Aortic atherosclerosis, in addition to left main and 3 vessel coronary artery disease. Status post median sternotomy for CABG including LIMA to the LAD. Electronically Signed   By: Trudie Reed  M.D.   On: 08/02/2016 19:07   Dg Foot 2 Views Left  Result Date: 08/02/2016 CLINICAL DATA:  Acute onset of soft tissue swelling and erythema at the left foot. Assess for underlying abscess. Initial encounter. EXAM: LEFT FOOT - 2 VIEW COMPARISON:  CT of the left foot performed 04/03/2016 FINDINGS: Since the prior CT, the patient is status post resection at the fourth and fifth mid metatarsals. Mild overlying soft tissue swelling is suggested. Underlying residual osseous erosion cannot be excluded, as the resection sites are somewhat irregular in appearance, but this is difficult to fully assess on radiograph. Diffuse vascular calcifications are seen. Scattered metallic BBs are noted within the ankle and foot. There is no evidence of acute fracture or dislocation. IMPRESSION: Note that evaluation for abscess is very limited  on radiograph. Mild soft tissue swelling noted at the prior resection site at the fourth and fifth mid metatarsals. Underlying residual osseous erosion cannot be excluded, as the resection sites are somewhat irregular in appearance, but this is not well assessed on radiograph. Ultrasound could be considered to evaluate for abscess. Alternatively, contrast-enhanced CT or three-phase bone scan could be considered to assess for osteomyelitis, depending on the degree of clinical concern. Electronically Signed   By: Roanna Raider M.D.   On: 08/02/2016 21:40     Scheduled Meds: . aspirin EC  81 mg Oral Daily  . atorvastatin  20 mg Oral q1800  . carvedilol  6.25 mg Oral BID WC  . docusate sodium  100 mg Oral BID  . DULoxetine  30 mg Oral Daily  . ferrous sulfate  325 mg Oral BID WC  . furosemide  80 mg Intravenous Q12H  . heparin  5,000 Units Subcutaneous Q8H  . hydrALAZINE  50 mg Oral TID  . insulin aspart  0-15 Units Subcutaneous TID WC  . insulin aspart  0-5 Units Subcutaneous QHS  . insulin glargine  17 Units Subcutaneous QHS  . isosorbide mononitrate  30 mg Oral Daily  . levothyroxine  25 mcg Oral QAC breakfast  . pantoprazole  40 mg Oral Daily  . polyethylene glycol  17 g Oral Daily   Continuous Infusions:   LOS: 1 day   Time Spent in minutes   30 minutes  Janvi Ammar D.O. on 08/03/2016 at 12:49 PM  Between 7am to 7pm - Pager - [phone]  After 7pm go to [email or web address] - password [email or web address]  And look for the night coverage person covering for me after hours  [facility] Office  [phone]

## 2016-08-04 ENCOUNTER — Inpatient Hospital Stay (HOSPITAL_COMMUNITY): Payer: Medicare Other

## 2016-08-04 ENCOUNTER — Encounter (HOSPITAL_COMMUNITY): Payer: Self-pay | Admitting: Radiology

## 2016-08-04 DIAGNOSIS — I5043 Acute on chronic combined systolic (congestive) and diastolic (congestive) heart failure: Secondary | ICD-10-CM

## 2016-08-04 DIAGNOSIS — I300 Acute nonspecific idiopathic pericarditis: Secondary | ICD-10-CM

## 2016-08-04 DIAGNOSIS — I5021 Acute systolic (congestive) heart failure: Secondary | ICD-10-CM

## 2016-08-04 HISTORY — PX: IR THORACENTESIS ASP PLEURAL SPACE W/IMG GUIDE: IMG5380

## 2016-08-04 LAB — GLUCOSE, CAPILLARY
GLUCOSE-CAPILLARY: 149 mg/dL — AB (ref 65–99)
GLUCOSE-CAPILLARY: 132 mg/dL — AB (ref 65–99)
Glucose-Capillary: 125 mg/dL — ABNORMAL HIGH (ref 65–99)
Glucose-Capillary: 152 mg/dL — ABNORMAL HIGH (ref 65–99)

## 2016-08-04 LAB — CBC
HCT: 29.1 % — ABNORMAL LOW (ref 39.0–52.0)
HEMOGLOBIN: 8.5 g/dL — AB (ref 13.0–17.0)
MCH: 25.4 pg — AB (ref 26.0–34.0)
MCHC: 29.2 g/dL — ABNORMAL LOW (ref 30.0–36.0)
MCV: 87.1 fL (ref 78.0–100.0)
PLATELETS: 433 10*3/uL — AB (ref 150–400)
RBC: 3.34 MIL/uL — ABNORMAL LOW (ref 4.22–5.81)
RDW: 17.7 % — ABNORMAL HIGH (ref 11.5–15.5)
WBC: 7.1 10*3/uL (ref 4.0–10.5)

## 2016-08-04 LAB — BODY FLUID CELL COUNT WITH DIFFERENTIAL
EOS FL: 0 %
LYMPHS FL: 10 %
Monocyte-Macrophage-Serous Fluid: 58 % (ref 50–90)
NEUTROPHIL FLUID: 32 % — AB (ref 0–25)
WBC FLUID: 882 uL (ref 0–1000)

## 2016-08-04 LAB — GLUCOSE, PLEURAL OR PERITONEAL FLUID: GLUCOSE FL: 134 mg/dL

## 2016-08-04 LAB — GRAM STAIN

## 2016-08-04 LAB — BASIC METABOLIC PANEL
ANION GAP: 7 (ref 5–15)
BUN: 26 mg/dL — ABNORMAL HIGH (ref 6–20)
CHLORIDE: 97 mmol/L — AB (ref 101–111)
CO2: 27 mmol/L (ref 22–32)
Calcium: 8.5 mg/dL — ABNORMAL LOW (ref 8.9–10.3)
Creatinine, Ser: 1.25 mg/dL — ABNORMAL HIGH (ref 0.61–1.24)
GFR, EST NON AFRICAN AMERICAN: 57 mL/min — AB (ref >60)
Glucose, Bld: 144 mg/dL — ABNORMAL HIGH (ref 65–99)
Potassium: 4.8 mmol/L (ref 3.5–5.1)
SODIUM: 131 mmol/L — AB (ref 135–145)

## 2016-08-04 LAB — ALBUMIN, PLEURAL OR PERITONEAL FLUID: Albumin, Fluid: 1.4 g/dL

## 2016-08-04 LAB — LACTATE DEHYDROGENASE, PLEURAL OR PERITONEAL FLUID: LD, Fluid: 112 U/L — ABNORMAL HIGH (ref 3–23)

## 2016-08-04 MED ORDER — LIDOCAINE HCL 1 % IJ SOLN
INTRAMUSCULAR | Status: AC
  Filled 2016-08-04: qty 20

## 2016-08-04 MED ORDER — COLCHICINE 0.6 MG PO TABS
0.6000 mg | ORAL_TABLET | Freq: Two times a day (BID) | ORAL | Status: DC
  Administered 2016-08-04 – 2016-08-07 (×7): 0.6 mg via ORAL
  Filled 2016-08-04 (×7): qty 1

## 2016-08-04 MED ORDER — LIDOCAINE HCL 1 % IJ SOLN
INTRAMUSCULAR | Status: DC | PRN
  Administered 2016-08-04: 10 mL

## 2016-08-04 NOTE — Consult Note (Addendum)
WOC Nurse wound consult note Reason for Consult: Consult requested for left foot.  Pt previously had surgery with Dr Arbie Cookey of the vascular team for amputation of 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s, according to the EMR. Wound type: Full thickness wound has developed on the inner area of the 3rd toe. Measurement: .3X.3cm dry eschar to lower area, 2.5X1.2X.6cm to upper area Wound bed: 100% loose yellow eschar Drainage (amount, consistency, odor) Mod amt tan drainage and strong foul odor Periwound: Intact skin surrounding Dressing procedure/placement/frequency: Moist gauze packing until further input is available from vascular team.   EMR indicates that a consult from vascular team is pending. Please refer to them for further plan of care. X-ray results indicate:  Note that evaluation for abscess is very limited on radiograph. Mild soft tissue swelling noted at the prior resection site at the fourth and fifth mid metatarsals. Underlying residual osseous erosion cannot be excluded, as the resection sites are somewhat irregular in appearance, but this is not well assessed on radiograph. Ultrasound could be considered to evaluate for abscess. Alternatively, contrast-enhanced CT or three-phase bone scan could be considered to assess for osteomyelitis, depending on the degree of clinical concern.  Please re-consult if further assistance is needed.  Thank-you,  Cammie Mcgee MSN, RN, CWOCN, [place], [place] [phone]

## 2016-08-04 NOTE — Progress Notes (Signed)
Progress Note  Patient Name: Ronald Simpson Date of Encounter: 08/04/2016  Primary Cardiologist: ? Teodoro Kil)   Subjective   Complains of L lower chest pain that worse with deep breath.   Inpatient Medications    Scheduled Meds: . aspirin EC  81 mg Oral Daily  . atorvastatin  20 mg Oral q1800  . carvedilol  6.25 mg Oral BID WC  . docusate sodium  100 mg Oral BID  . DULoxetine  30 mg Oral Daily  . feeding supplement (ENSURE ENLIVE)  237 mL Oral BID BM  . ferrous sulfate  325 mg Oral BID WC  . furosemide  80 mg Intravenous Q12H  . heparin  5,000 Units Subcutaneous Q8H  . hydrALAZINE  50 mg Oral TID  . insulin aspart  0-15 Units Subcutaneous TID WC  . insulin aspart  0-5 Units Subcutaneous QHS  . insulin glargine  17 Units Subcutaneous QHS  . isosorbide mononitrate  30 mg Oral Daily  . levothyroxine  25 mcg Oral QAC breakfast  . pantoprazole  40 mg Oral Daily  . polyethylene glycol  17 g Oral Daily   Continuous Infusions:  PRN Meds: acetaminophen **OR** acetaminophen, HYDROcodone-acetaminophen, ipratropium-albuterol, nitroGLYCERIN, ondansetron **OR** ondansetron (ZOFRAN) IV   Vital Signs    Vitals:   08/03/16 1741 08/03/16 1956 08/03/16 2013 08/04/16 0421  BP: 138/83  (!) 144/90 134/77  Pulse: 92 94 95 84  Resp:  (!) 22 (!) 22 20  Temp:   98.3 F (36.8 C) 98.2 F (36.8 C)  TempSrc:   Oral Oral  SpO2: 100% 100% 100% 100%  Weight:    166 lb (75.3 kg)  Height:        Intake/Output Summary (Last 24 hours) at 08/04/16 0834 Last data filed at 08/04/16 0421  Gross per 24 hour  Intake              995 ml  Output              550 ml  Net              445 ml   Filed Weights   08/02/16 2206 08/03/16 0607 08/04/16 0421  Weight: 180 lb (81.6 kg) 165 lb 12.6 oz (75.2 kg) 166 lb (75.3 kg)    Telemetry    NSR - Personally Reviewed  ECG    None today   Physical Exam   WUJ:[id number] ill appearing male in no acute distress.   Neck: + JVD up to jaw Cardiac:  RRR, no murmurs, or gallops. + rub Respiratory: Clear to auscultation bilaterally. Less air movement throughout.  GI: Soft, nontender, distended  MS: No edema; No deformity. Neuro:  Nonfocal  Psych: Normal affect  Extremity: R AKA. Ulcer on left foot with trace edema.  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   Labs    Chemistry Recent Labs Lab 08/02/16 1610 08/03/16 0312  NA 134* 133*  K 4.4 4.2  CL 99* 98*  CO2 27 26  GLUCOSE 121* 152*  BUN 24* 25*  CREATININE 1.09 1.14  CALCIUM 8.6* 8.5*  PROT 7.8  --   ALBUMIN 2.4*  --   AST 19  --   ALT 8*  --   ALKPHOS 141*  --   BILITOT 1.6*  --   GFRNONAA >60 >60  GFRAA >60 >60  ANIONGAP 8 9     Hematology Recent Labs Lab 08/02/16 1610 08/03/16 0312  WBC 8.0 6.4  RBC 3.44*  3.45*  HGB 9.1* 9.1*  HCT 30.0* 30.4*  MCV 87.2 88.1  MCH 26.5 26.4  MCHC 30.3 29.9*  RDW 18.1* 18.1*  PLT 437* 392    Cardiac Enzymes Recent Labs Lab 08/02/16 2050 08/03/16 0312  TROPONINI <0.03 <0.03    Recent Labs Lab 08/02/16 1608  TROPIPOC 0.00     BNP Recent Labs Lab 08/02/16 1610  BNP 1,021.2*     DDimer  Recent Labs Lab 08/02/16 1610  DDIMER 8.12*     Radiology    Dg Chest 2 View  Result Date: 08/02/2016 CLINICAL DATA:  Left-sided chest pain beginning last night. EXAM: CHEST  2 VIEW COMPARISON:  04/02/2016 FINDINGS: Sequelae of prior CABG and mitral valve repair are again identified. The cardiac silhouette remains mildly enlarged. Lung volumes are diminished with mild pulmonary vascular congestion. There are moderate right and small left pleural effusions with patchy bibasilar airspace opacities bilaterally. No acute osseous abnormality is seen. IMPRESSION: 1. Moderate right and small left pleural effusions. 2. Cardiomegaly and pulmonary vascular congestion. Bibasilar opacities may reflect edema, pneumonia, or atelectasis. Electronically Signed   By: Sebastian Ache M.D.   On: 08/02/2016 15:18   Ct Angio Chest Pe W  Or Wo Contrast  Result Date: 08/02/2016 CLINICAL DATA:  71 year old male with history of shortness of breath and left-sided chest pain while breathing. EXAM: CT ANGIOGRAPHY CHEST WITH CONTRAST TECHNIQUE: Multidetector CT imaging of the chest was performed using the standard protocol during bolus administration of intravenous contrast. Multiplanar CT image reconstructions and MIPs were obtained to evaluate the vascular anatomy. CONTRAST:  80 mL of Isovue 370. COMPARISON:  No priors. FINDINGS: Cardiovascular: No filling defects within the pulmonary arterial tree to suggest underlying pulmonary embolism. Heart size is mildly enlarge. There is no significant pericardial fluid, thickening or pericardial calcification. There is aortic atherosclerosis, as well as atherosclerosis of the great vessels of the mediastinum and the coronary arteries, including calcified atherosclerotic plaque in the left main, left anterior descending, left circumflex and right coronary arteries. Status post median sternotomy for CABG, including LIMA to the LAD, as well as mitral annuloplasty. Left atrial appendage does not fill with contrast, concerning for thrombus. Mediastinum/Nodes: No pathologically enlarged mediastinal or hilar lymph nodes. Esophagus is unremarkable in appearance. No axillary lymphadenopathy. Lungs/Pleura: Large right and moderate left pleural effusions with extensive passive atelectasis throughout the dependent portions of the lungs bilaterally. Right lower lobe is completely collapsed, as is much of the right middle lobe. No definite consolidative airspace disease. Within the aerated portions of the lungs there are no definite suspicious appearing pulmonary nodules or masses. Upper Abdomen: Aortic atherosclerosis. Musculoskeletal: Median sternotomy wires. There are no aggressive appearing lytic or blastic lesions noted in the visualized portions of the skeleton. Review of the MIP images confirms the above findings.  IMPRESSION: 1. No evidence of pulmonary embolism. 2. Left atrial appendage does not fill with contrast, suggesting thrombus. This places the patient at risk for systemic embolization. Further evaluation with transesophageal echocardiography is suggested if clinically appropriate. 3. Large right and moderate left pleural effusions with extensive passive atelectasis in the dependent portions of the lungs bilaterally, as above. 4. Aortic atherosclerosis, in addition to left main and 3 vessel coronary artery disease. Status post median sternotomy for CABG including LIMA to the LAD. Electronically Signed   By: Trudie Reed M.D.   On: 08/02/2016 19:07   Dg Foot 2 Views Left  Result Date: 08/02/2016 CLINICAL DATA:  Acute onset of soft tissue  swelling and erythema at the left foot. Assess for underlying abscess. Initial encounter. EXAM: LEFT FOOT - 2 VIEW COMPARISON:  CT of the left foot performed 04/03/2016 FINDINGS: Since the prior CT, the patient is status post resection at the fourth and fifth mid metatarsals. Mild overlying soft tissue swelling is suggested. Underlying residual osseous erosion cannot be excluded, as the resection sites are somewhat irregular in appearance, but this is difficult to fully assess on radiograph. Diffuse vascular calcifications are seen. Scattered metallic BBs are noted within the ankle and foot. There is no evidence of acute fracture or dislocation. IMPRESSION: Note that evaluation for abscess is very limited on radiograph. Mild soft tissue swelling noted at the prior resection site at the fourth and fifth mid metatarsals. Underlying residual osseous erosion cannot be excluded, as the resection sites are somewhat irregular in appearance, but this is not well assessed on radiograph. Ultrasound could be considered to evaluate for abscess. Alternatively, contrast-enhanced CT or three-phase bone scan could be considered to assess for osteomyelitis, depending on the degree of clinical  concern. Electronically Signed   By: Roanna Raider M.D.   On: 08/02/2016 21:40    Cardiac Studies   Echo 08/03/16 Study Conclusions  - Left ventricle: The cavity size was normal. Wall thickness was   increased in a pattern of mild LVH. Systolic function was   moderately reduced. The estimated ejection fraction was in the   range of 35% to 40%. Diffuse hypokinesis worse in inferior and   inferolateral walls. - Ventricular septum: The contour showed diastolic flattening and   systolic flattening. - Mitral valve: Calcified annulus. An annular ring prosthesis was   present. There was moderate regurgitation. There is inflow   turbulence across the MV ring. Visually the pressure 1/2 time   velocity looks mild but was not quantified. The gradient was not   measured. Consider repeat imaging to further evalaute. - Left atrium: The atrium was moderately dilated. - Right ventricle: The cavity size was moderately dilated. Systolic   function was moderately reduced. - Right atrium: The atrium was severely dilated. - Tricuspid valve: There was moderate regurgitation. - Pulmonary arteries: PA peak pressure: 43 mm Hg (S).   CT angio of chest 08/02/16 IMPRESSION: 1. No evidence of pulmonary embolism. 2. Left atrial appendage does not fill with contrast, suggesting thrombus. This places the patient at risk for systemic embolization. Further evaluation with transesophageal echocardiography is suggested if clinically appropriate. 3. Large right and moderate left pleural effusions with extensive passive atelectasis in the dependent portions of the lungs bilaterally, as above. 4. Aortic atherosclerosis, in addition to left main and 3 vessel coronary artery disease. Status post median sternotomy for CABG including LIMA to the LAD.  Patient Profile     71 yo male with PMH of CAD with remote CABG, CVA, IDDM, Hep C with cirrhosis, PVD, Right LE amputation, PAF and peripheral neuropathy who  presented with pleuritic chest pain.  Assessment & Plan    1. Pleuritic chest pain  With hx of remote CABG - Seems non cardiac in nature. Troponin negative. ECG without acute findings. No further ischemic workup planned this time. No PE on CTA.   2. Bilateral pleural effusion  - Pending IR evaluation for thoracentesis.  3. Acute systolic CHF - Unknown prior EF. Echo showed LV EF of 35% to 40%. Diffuse hypokinesis worse in inferior and inferolateral walls. See details above. BNP > 1000. Continue IV lasix. Net I & O+326. He has foley.  Volume overloaded on exam. Evidence of R sided HF on echo with prior hx of cirrhosis.  4. PAF - Maintaining sinus rhythm. LA appendage findings likely related to clipping of appendage at time of CABG. Not on anticoagulation likely given hx of IC hemorrhage, GI bleed and hx of cirrhosis.   5. Moderate MR per echo - An annular ring prosthesis was  present. There was moderate regurgitation. There is inflow  turbulence across the MV ring. Visually the pressure 1/2 time velocity looks mild but was not quantified. The gradient was not measured. Consider repeat imaging to further evalaute. - Per MD.   6. DM - per primary   7. HTN - BP stable on current regimen.   Signed, Manson Passey, PA  08/04/2016, 8:34 AM    The patient was seen, examined and discussed with Bhagat,Bhavinkumar PA-C and I agree with the above.   71 yo male with PMH of CAD with remote CABG, CVA, IDDM, Hep C with cirrhosis, PVD, Right LE amputation, PAF and peripheral neuropathy who presented with pleuritic chest pain in the settings of bilateral pleural effusions. He has pericardial rub on physical exam, I would recommend adding colchicine 0.6 mg po BID to his regimen. He has acute on chronic combined systolic and diastolic CHF, LVEF 35-40%, he is still fluid overloaded on physical exam, I would continue iv Lasix, but believe that he would benefit from thoracentesis. He is post LAA clipping,  thrombus is unlikely.  Tobias Alexander 08/04/2016

## 2016-08-04 NOTE — Procedures (Signed)
PROCEDURE SUMMARY:  Successful US guided right thoracentesis. Yielded 750 mL of hazy, amber/blood-tinged fluid. Pt tolerated procedure well. No immediate complications.  Specimen was sent for labs. CXR ordered.  Brayton El PA-C 08/04/2016 3:42 PM

## 2016-08-04 NOTE — Progress Notes (Signed)
PROGRESS NOTE                                                                                                                                                                                                             Patient Demographics:    Ronald Simpson, is a 71 y.o. male, DOB - 1945-07-23, ZOX:[id number]  Admit date - 08/02/2016   Admitting Physician Michael Litter, MD  Outpatient Primary MD for the patient is Marga Melnick, MD  LOS - 2  Outpatient Specialists:None  Chief Complaint  Patient presents with  . Chest Pain       Brief Narrative   71 year old male with history of CAD status post CABG, insulin-dependent diabetes with peripheral neuropathy, PVD with history of right AKA, nonhealing diabetic foot ulcer on the left foot status post partial left toe amputation, paroxysmal A. fib (previously on warfarin but held after GI bleed in February this year), prior CVA, hep C who presented from SNF for pleuritic left-sided chest pain at rest. This was associated with dyspnea on exertion. Workup showed bilateral pleural effusions.     Subjective:   Patient reports his breathing to be minimally improved. Denies further chest pain.   Assessment  & Plan :    Principal Problem:   Pleuritic chest pain With bilateral pleural effusions Large right and moderate left pleural effusions on CTA chest. IR consult for thoracentesis. Serial troponin negative, EKG unremarkable. Cardiology added colchicine given pericardial rub on exam.  Acute systolic CHF -2-D echo showing moderate to severely reduced EF of 35-40% with diffuse hypokinesis. Also has moderate mitral regurgitation. Continue IV Lasix 80 mg twice a day. Urine output not very significant. Monitor strict I/O and daily weight. IR consulted for thoracentesis.       Active Problems: Paroxysmal A. fib Off anticoagulation due to history of GI bleed, intracranial  hemorrhage and cirrhosis. Currently in sinus rhythm. LA appendage finding concerning for left atrial thrombus seen on CT angiogram is likely related to clipping of appanage during CABG. (As seen on 2-D echo)  Essential hypertension Continue Lasix, Coreg and Imdur.  Coronary artery disease status post CABG Continue aspirin, statin, Imdur and Coreg.     Diabetes mellitus with peripheral vascular disease (HCC) Continue Lantus with sliding scale coverage.  Nonhealing diabetic left foot ulcer Will consult wound care. Follows with Dr.  early.  Normocytic anemia Chronic, hemoglobin at baseline.  Hepatitis C cirrhosis of liver Compensated. LFTs normal.  Hypothyroidism Continue Synthroid  Depression Continue Cymbalta       Code Status : Full Code  Family Communication  : None at bedside  Disposition Plan  : Pending clinical improvement  Barriers For Discharge : Active symptoms  Consults  :   Cardiology IR  Procedures  :  CT angiogram of the chest 2-D echo  DVT Prophylaxis  :  Heparin  Lab Results  Component Value Date   PLT 433 (H) 08/04/2016    Antibiotics  :   Anti-infectives    None        Objective:   Vitals:   08/03/16 1956 08/03/16 2013 08/04/16 0421 08/04/16 1052  BP:  (!) 144/90 134/77 138/78  Pulse: 94 95 84   Resp: (!) 22 (!) 22 20   Temp:  98.3 F (36.8 C) 98.2 F (36.8 C)   TempSrc:  Oral Oral   SpO2: 100% 100% 100%   Weight:   75.3 kg (166 lb)   Height:        Wt Readings from Last 3 Encounters:  08/04/16 75.3 kg (166 lb)  07/22/16 81.2 kg (179 lb)  07/17/16 81.2 kg (179 lb)     Intake/Output Summary (Last 24 hours) at 08/04/16 1158 Last data filed at 08/04/16 0900  Gross per 24 hour  Intake              875 ml  Output              550 ml  Net              325 ml     Physical Exam  Gen: not in distress HEENT: moist mucosa, supple neck Chest: Diminished bilateral breath sounds CVS: N S1&S2, systolic murmur 2/6 GI:  soft, NT, ND,  Musculoskeletal: warm, right AKA, dressing over her left foot with prior left little toe amputation with mucopurulent discharge     Data Review:    CBC  Recent Labs Lab 08/02/16 1610 08/03/16 0312 08/04/16 1046  WBC 8.0 6.4 7.1  HGB 9.1* 9.1* 8.5*  HCT 30.0* 30.4* 29.1*  PLT 437* 392 433*  MCV 87.2 88.1 87.1  MCH 26.5 26.4 25.4*  MCHC 30.3 29.9* 29.2*  RDW 18.1* 18.1* 17.7*    Chemistries   Recent Labs Lab 08/02/16 1610 08/03/16 0312  NA 134* 133*  K 4.4 4.2  CL 99* 98*  CO2 27 26  GLUCOSE 121* 152*  BUN 24* 25*  CREATININE 1.09 1.14  CALCIUM 8.6* 8.5*  AST 19  --   ALT 8*  --   ALKPHOS 141*  --   BILITOT 1.6*  --    ------------------------------------------------------------------------------------------------------------------ No results for input(s): CHOL, HDL, LDLCALC, TRIG, CHOLHDL, LDLDIRECT in the last 72 hours.  Lab Results  Component Value Date   HGBA1C 6.8 07/03/2016   ------------------------------------------------------------------------------------------------------------------ No results for input(s): TSH, T4TOTAL, T3FREE, THYROIDAB in the last 72 hours.  Invalid input(s): FREET3 ------------------------------------------------------------------------------------------------------------------ No results for input(s): VITAMINB12, FOLATE, FERRITIN, TIBC, IRON, RETICCTPCT in the last 72 hours.  Coagulation profile  Recent Labs Lab 08/02/16 1610  INR 1.26     Recent Labs  08/02/16 1610  DDIMER 8.12*    Cardiac Enzymes  Recent Labs Lab 08/02/16 2050 08/03/16 0312  TROPONINI <0.03 <0.03   ------------------------------------------------------------------------------------------------------------------    Component Value Date/Time   BNP 1,021.2 (H) 08/02/2016 1610  Inpatient Medications  Scheduled Meds: . aspirin EC  81 mg Oral Daily  . atorvastatin  20 mg Oral q1800  . carvedilol  6.25 mg Oral BID  WC  . colchicine  0.6 mg Oral BID  . docusate sodium  100 mg Oral BID  . DULoxetine  30 mg Oral Daily  . feeding supplement (ENSURE ENLIVE)  237 mL Oral BID BM  . ferrous sulfate  325 mg Oral BID WC  . furosemide  80 mg Intravenous Q12H  . heparin  5,000 Units Subcutaneous Q8H  . hydrALAZINE  50 mg Oral TID  . insulin aspart  0-15 Units Subcutaneous TID WC  . insulin aspart  0-5 Units Subcutaneous QHS  . insulin glargine  17 Units Subcutaneous QHS  . isosorbide mononitrate  30 mg Oral Daily  . levothyroxine  25 mcg Oral QAC breakfast  . pantoprazole  40 mg Oral Daily  . polyethylene glycol  17 g Oral Daily   Continuous Infusions: PRN Meds:.acetaminophen **OR** acetaminophen, HYDROcodone-acetaminophen, ipratropium-albuterol, nitroGLYCERIN, ondansetron **OR** ondansetron (ZOFRAN) IV  Micro Results Recent Results (from the past 240 hour(s))  MRSA PCR Screening     Status: Abnormal   Collection Time: 08/03/16  1:22 AM  Result Value Ref Range Status   MRSA by PCR POSITIVE (A) NEGATIVE Final    Comment:        The GeneXpert MRSA Assay (FDA approved for NASAL specimens only), is one component of a comprehensive MRSA colonization surveillance program. It is not intended to diagnose MRSA infection nor to guide or monitor treatment for MRSA infections. RESULT CALLED TO, READ BACK BY AND VERIFIED WITH: MENDEZ,V RN [phone] AT [phone] [facility]     Radiology Reports Dg Chest 2 View  Result Date: 08/02/2016 CLINICAL DATA:  Left-sided chest pain beginning last night. EXAM: CHEST  2 VIEW COMPARISON:  04/02/2016 FINDINGS: Sequelae of prior CABG and mitral valve repair are again identified. The cardiac silhouette remains mildly enlarged. Lung volumes are diminished with mild pulmonary vascular congestion. There are moderate right and small left pleural effusions with patchy bibasilar airspace opacities bilaterally. No acute osseous abnormality is seen. IMPRESSION: 1. Moderate right and small left  pleural effusions. 2. Cardiomegaly and pulmonary vascular congestion. Bibasilar opacities may reflect edema, pneumonia, or atelectasis. Electronically Signed   By: Sebastian Ache M.D.   On: 08/02/2016 15:18   Ct Angio Chest Pe W Or Wo Contrast  Result Date: 08/02/2016 CLINICAL DATA:  71 year old male with history of shortness of breath and left-sided chest pain while breathing. EXAM: CT ANGIOGRAPHY CHEST WITH CONTRAST TECHNIQUE: Multidetector CT imaging of the chest was performed using the standard protocol during bolus administration of intravenous contrast. Multiplanar CT image reconstructions and MIPs were obtained to evaluate the vascular anatomy. CONTRAST:  80 mL of Isovue 370. COMPARISON:  No priors. FINDINGS: Cardiovascular: No filling defects within the pulmonary arterial tree to suggest underlying pulmonary embolism. Heart size is mildly enlarge. There is no significant pericardial fluid, thickening or pericardial calcification. There is aortic atherosclerosis, as well as atherosclerosis of the great vessels of the mediastinum and the coronary arteries, including calcified atherosclerotic plaque in the left main, left anterior descending, left circumflex and right coronary arteries. Status post median sternotomy for CABG, including LIMA to the LAD, as well as mitral annuloplasty. Left atrial appendage does not fill with contrast, concerning for thrombus. Mediastinum/Nodes: No pathologically enlarged mediastinal or hilar lymph nodes. Esophagus is unremarkable in appearance. No axillary lymphadenopathy. Lungs/Pleura: Large right and moderate left  pleural effusions with extensive passive atelectasis throughout the dependent portions of the lungs bilaterally. Right lower lobe is completely collapsed, as is much of the right middle lobe. No definite consolidative airspace disease. Within the aerated portions of the lungs there are no definite suspicious appearing pulmonary nodules or masses. Upper Abdomen:  Aortic atherosclerosis. Musculoskeletal: Median sternotomy wires. There are no aggressive appearing lytic or blastic lesions noted in the visualized portions of the skeleton. Review of the MIP images confirms the above findings. IMPRESSION: 1. No evidence of pulmonary embolism. 2. Left atrial appendage does not fill with contrast, suggesting thrombus. This places the patient at risk for systemic embolization. Further evaluation with transesophageal echocardiography is suggested if clinically appropriate. 3. Large right and moderate left pleural effusions with extensive passive atelectasis in the dependent portions of the lungs bilaterally, as above. 4. Aortic atherosclerosis, in addition to left main and 3 vessel coronary artery disease. Status post median sternotomy for CABG including LIMA to the LAD. Electronically Signed   By: Trudie Reed M.D.   On: 08/02/2016 19:07   Dg Foot 2 Views Left  Result Date: 08/02/2016 CLINICAL DATA:  Acute onset of soft tissue swelling and erythema at the left foot. Assess for underlying abscess. Initial encounter. EXAM: LEFT FOOT - 2 VIEW COMPARISON:  CT of the left foot performed 04/03/2016 FINDINGS: Since the prior CT, the patient is status post resection at the fourth and fifth mid metatarsals. Mild overlying soft tissue swelling is suggested. Underlying residual osseous erosion cannot be excluded, as the resection sites are somewhat irregular in appearance, but this is difficult to fully assess on radiograph. Diffuse vascular calcifications are seen. Scattered metallic BBs are noted within the ankle and foot. There is no evidence of acute fracture or dislocation. IMPRESSION: Note that evaluation for abscess is very limited on radiograph. Mild soft tissue swelling noted at the prior resection site at the fourth and fifth mid metatarsals. Underlying residual osseous erosion cannot be excluded, as the resection sites are somewhat irregular in appearance, but this is not  well assessed on radiograph. Ultrasound could be considered to evaluate for abscess. Alternatively, contrast-enhanced CT or three-phase bone scan could be considered to assess for osteomyelitis, depending on the degree of clinical concern. Electronically Signed   By: Roanna Raider M.D.   On: 08/02/2016 21:40   Ir Paracentesis  Result Date: 07/07/2016 INDICATION: Chronic hepatitis C. Ascites. Request for diagnostic and therapeutic paracentesis. EXAM: ULTRASOUND GUIDED RIGHT LATERAL ABDOMEN PARACENTESIS MEDICATIONS: 1% Lidocaine. COMPLICATIONS: None immediate. PROCEDURE: Informed written consent was obtained from the patient after a discussion of the risks, benefits and alternatives to treatment. A timeout was performed prior to the initiation of the procedure. Initial ultrasound scanning demonstrates a small amount of ascites within the right lower abdominal quadrant. The right lower abdomen was prepped and draped in the usual sterile fashion. 1% lidocaine with epinephrine was used for local anesthesia. Following this, a 19 gauge, 7-cm, Yueh catheter was introduced. An ultrasound image was saved for documentation purposes. The paracentesis was performed. The catheter was removed and a dressing was applied. The patient tolerated the procedure well without immediate post procedural complication. FINDINGS: A total of approximately 960 mL of clear yellow fluid was removed. Samples were sent to the laboratory as requested by the clinical team. IMPRESSION: Successful ultrasound-guided paracentesis yielding 960 mL of peritoneal fluid. Read by:  Corrin Parker, PA-C Electronically Signed   By: Jolaine Click M.D.   On: 07/07/2016 14:25  Time Spent in minutes  35   Eddie North M.D on 08/04/2016 at 11:58 AM  Between 7am to 7pm - Pager - [phone]  After 7pm go to [email or web address] - password [facility]  [facility] -  Office  [phone]

## 2016-08-04 NOTE — Progress Notes (Signed)
Initial Nutrition Assessment  DOCUMENTATION CODES:   Obesity unspecified  INTERVENTION:    Continue Ensure Enlive po BID, each supplement provides 350 kcal and 20 grams of protein   Prostat liquid protein po 30 ml BID with meals, each supplement provides 100 kcal, 15 grams protein  NUTRITION DIAGNOSIS:   Increased nutrient needs related to wound healing as evidenced by estimated needs  GOAL:   Patient will meet greater than or equal to 90% of their needs  MONITOR:   PO intake, Supplement acceptance, Labs, Weight 19trends, Skin, I & O's  REASON FOR ASSESSMENT:   Consult Wound healing  ASSESSMENT:   71 y.o. Male with a history of CAD S/P CABG, IDDM with peripheral neuropathy, PVD S/P Right AKA, nonhealing diabetic foot ulcer on the left foot S/P partial amputation, paroxysmal atrial fibrillation, prior CVA, and hep C with possible Cirrhosis who presents from SNF for evaluation of pleuritic, left sided chest pain.   Pt with large R and moderate L pleural effusions.  IR consulted for thoracentesis. Reports his appetite is OK.  Limited intake PTA.  PO intake variable at 0-100% per flowsheets. Has Ensure Enlive nutrition supplements ordered BID.  Drinking some. Medications reviewed.  Labs reviewed.  Sodium 131 (L). CBG's [id number].  Nutrition focused physical exam completed.  No muscle or subcutaneous fat depletion noticed.  Diet Order:  Diet Carb Modified Fluid consistency: Thin; Room service appropriate? Yes  Skin:  Wound (see comment) (Full thickness wound to inner area of the 3rd toe)  Last BM:  4/22  Height:   Ht Readings from Last 1 Encounters:  08/03/16  (1.727 m)   Weight:   Wt Readings from Last 1 Encounters:  08/04/16 166 lb (75.3 kg)   BMI:  28.2 kg/m2 (adjusted for AKA)  Estimated Nutritional Needs:   Kcal:  1800-2000  Protein:  100-110 gm  Fluid:  1.8-2.0 L  EDUCATION NEEDS:   No education needs identified at this time  [name] Chatters, RD, LDN Pager #: [phone] After-Hours Pager #: [phone]

## 2016-08-05 ENCOUNTER — Inpatient Hospital Stay (HOSPITAL_COMMUNITY): Payer: Medicare Other

## 2016-08-05 DIAGNOSIS — I5043 Acute on chronic combined systolic (congestive) and diastolic (congestive) heart failure: Secondary | ICD-10-CM

## 2016-08-05 DIAGNOSIS — N189 Chronic kidney disease, unspecified: Secondary | ICD-10-CM

## 2016-08-05 DIAGNOSIS — N179 Acute kidney failure, unspecified: Secondary | ICD-10-CM

## 2016-08-05 DIAGNOSIS — I70245 Atherosclerosis of native arteries of left leg with ulceration of other part of foot: Secondary | ICD-10-CM

## 2016-08-05 DIAGNOSIS — L089 Local infection of the skin and subcutaneous tissue, unspecified: Secondary | ICD-10-CM

## 2016-08-05 LAB — BASIC METABOLIC PANEL
Anion gap: 8 (ref 5–15)
BUN: 29 mg/dL — ABNORMAL HIGH (ref 6–20)
CHLORIDE: 97 mmol/L — AB (ref 101–111)
CO2: 27 mmol/L (ref 22–32)
Calcium: 8.8 mg/dL — ABNORMAL LOW (ref 8.9–10.3)
Creatinine, Ser: 1.3 mg/dL — ABNORMAL HIGH (ref 0.61–1.24)
GFR, EST NON AFRICAN AMERICAN: 55 mL/min — AB (ref >60)
Glucose, Bld: 153 mg/dL — ABNORMAL HIGH (ref 65–99)
POTASSIUM: 4.5 mmol/L (ref 3.5–5.1)
SODIUM: 132 mmol/L — AB (ref 135–145)

## 2016-08-05 LAB — CBC
HCT: 28 % — ABNORMAL LOW (ref 39.0–52.0)
HEMOGLOBIN: 8.4 g/dL — AB (ref 13.0–17.0)
MCH: 25.8 pg — AB (ref 26.0–34.0)
MCHC: 30 g/dL (ref 30.0–36.0)
MCV: 86.2 fL (ref 78.0–100.0)
Platelets: 412 10*3/uL — ABNORMAL HIGH (ref 150–400)
RBC: 3.25 MIL/uL — AB (ref 4.22–5.81)
RDW: 17.7 % — ABNORMAL HIGH (ref 11.5–15.5)
WBC: 6.4 10*3/uL (ref 4.0–10.5)

## 2016-08-05 LAB — GLUCOSE, CAPILLARY
GLUCOSE-CAPILLARY: 142 mg/dL — AB (ref 65–99)
GLUCOSE-CAPILLARY: 115 mg/dL — AB (ref 65–99)
GLUCOSE-CAPILLARY: 137 mg/dL — AB (ref 65–99)
Glucose-Capillary: 114 mg/dL — ABNORMAL HIGH (ref 65–99)

## 2016-08-05 LAB — LACTATE DEHYDROGENASE: LDH: 276 U/L — AB (ref 98–192)

## 2016-08-05 MED ORDER — AMOXICILLIN-POT CLAVULANATE 875-125 MG PO TABS
1.0000 | ORAL_TABLET | Freq: Two times a day (BID) | ORAL | Status: DC
  Administered 2016-08-05 – 2016-08-07 (×5): 1 via ORAL
  Filled 2016-08-05 (×5): qty 1

## 2016-08-05 MED ORDER — IOPAMIDOL (ISOVUE-300) INJECTION 61%
INTRAVENOUS | Status: AC
  Administered 2016-08-05: 75 mL
  Filled 2016-08-05: qty 100

## 2016-08-05 MED ORDER — CHLORHEXIDINE GLUCONATE CLOTH 2 % EX PADS
6.0000 | MEDICATED_PAD | Freq: Every day | CUTANEOUS | Status: DC
  Administered 2016-08-06 – 2016-08-07 (×2): 6 via TOPICAL

## 2016-08-05 MED ORDER — MUPIROCIN 2 % EX OINT
1.0000 "application " | TOPICAL_OINTMENT | Freq: Two times a day (BID) | CUTANEOUS | Status: DC
  Administered 2016-08-05 – 2016-08-07 (×4): 1 via NASAL
  Filled 2016-08-05 (×2): qty 22

## 2016-08-05 NOTE — Plan of Care (Signed)
Problem: Safety: Goal: Ability to remain free from injury will improve Outcome: Progressing RN instructed patient to call and wait for staff assistance prior to attempting to getting out of bed.  Patient stated understanding and has made no attempts to get out of bed thus far this shift.

## 2016-08-05 NOTE — Consult Note (Signed)
[facility] NOTE   MRN : [id number]  Reason for Consult: foot ulcer drainage Referring Physician: Dhungel  History of Present Illness: This patient is being actively followed by Dr. Arbie Cookey.  He was last seen 05/27/2016 s/p open left fourth and fifth toe amputations on 04/04/2016. He is at a nursing facility. He is undergoing local wound care at this facility.  He was admitted today for Pleuritic chest pain.  We have been asked to consult due to left foot wound has purulent malodorous drainage.    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  He resides in a SNF.     Current Facility-Administered Medications  Medication Dose Route Frequency Provider Last Rate Last Dose  . acetaminophen (TYLENOL) tablet 650 mg  650 mg Oral Q6H PRN Michael Litter, MD       Or  . acetaminophen (TYLENOL) suppository 650 mg  650 mg Rectal Q6H PRN Michael Litter, MD      . amoxicillin-clavulanate (AUGMENTIN) 875-125 MG per tablet 1 tablet  1 tablet Oral Q12H Nishant Dhungel, MD      . aspirin EC tablet 81 mg  81 mg Oral Daily Michael Litter, MD   81 mg at 08/04/16 1052  . atorvastatin (LIPITOR) tablet 20 mg  20 mg Oral q1800 Michael Litter, MD   20 mg at 08/04/16 1631  . carvedilol (COREG) tablet 6.25 mg  6.25 mg Oral BID WC Michael Litter, MD   6.25 mg at 08/05/16 0930  . colchicine tablet 0.6 mg  0.6 mg Oral BID Lars Masson, MD   0.6 mg at 08/04/16 2321  . docusate sodium (COLACE) capsule 100 mg  100 mg Oral BID Michael Litter, MD   100 mg at 08/03/16 2232  . DULoxetine (CYMBALTA) DR capsule 30 mg  30 mg Oral Daily Michael Litter, MD   30 mg at 08/04/16 1053  . feeding supplement (ENSURE ENLIVE) (ENSURE  ENLIVE) liquid 237 mL  237 mL Oral BID BM Maryann Mikhail, DO   237 mL at 08/04/16 1630  . ferrous sulfate tablet 325 mg  325 mg Oral BID WC Michael Litter, MD   325 mg at 08/05/16 0929  . heparin injection 5,000 Units  5,000 Units Subcutaneous Q8H Dolores Patty, MD   5,000 Units at 08/05/16 4098  . hydrALAZINE (APRESOLINE) tablet 50 mg  50 mg Oral TID Michael Litter, MD   50 mg at 08/04/16 2320  . HYDROcodone-acetaminophen (NORCO/VICODIN) 5-325 MG per tablet 1 tablet  1 tablet Oral Q6H PRN Michael Litter, MD   1 tablet at 08/04/16 2346  . insulin aspart (novoLOG) injection 0-15 Units  0-15 Units Subcutaneous TID WC Michael Litter, MD   2 Units at 08/04/16 1753  . insulin aspart (novoLOG) injection 0-5 Units  0-5 Units Subcutaneous QHS Michael Litter, MD      . insulin glargine (LANTUS) injection 17 Units  17 Units Subcutaneous QHS Michael Litter, MD   17 Units at 08/04/16 2321  . ipratropium-albuterol (DUONEB) 0.5-2.5 (3) MG/3ML nebulizer solution 3 mL  3 mL Nebulization Q6H PRN Michael Litter, MD      . isosorbide mononitrate (IMDUR) 24 hr tablet 30 mg  30 mg Oral Daily Michael Litter, MD  30 mg at 08/04/16 1053  . levothyroxine (SYNTHROID, LEVOTHROID) tablet 25 mcg  25 mcg Oral QAC breakfast Michael Litter, MD   25 mcg at 08/05/16 0930  . lidocaine (XYLOCAINE) 1 % (with pres) injection    PRN Brayton El, PA-C   10 mL at 08/04/16 1530  . nitroGLYCERIN (NITROSTAT) SL tablet 0.4 mg  0.4 mg Sublingual Q5 min PRN Michael Litter, MD      . ondansetron [facility]) tablet 4 mg  4 mg Oral Q6H PRN Michael Litter, MD       Or  . ondansetron [facility]) injection 4 mg  4 mg Intravenous Q6H PRN Michael Litter, MD      . pantoprazole (PROTONIX) EC tablet 40 mg  40 mg Oral Daily Michael Litter, MD   40 mg at 08/04/16 1053  . polyethylene glycol (MIRALAX / GLYCOLAX) packet 17 g  17 g Oral Daily Michael Litter, MD   17 g at 08/03/16 0947    Pt meds include: Statin :Yes Betablocker: Yes ASA: No Other anticoagulants/antiplatelets:    Past Medical History:  Diagnosis Date  . CAD (coronary artery disease)    01/15/16 he states he has had a heart attack  . CVA (cerebral infarction)   . DM (diabetes mellitus), type 2 with peripheral vascular complications (HCC)   . History of hepatitis C 1990s   Hep B immune  . Peripheral vascular disease [facility])     Past Surgical History:  Procedure Laterality Date  . AMPUTATION Right 01/07/2016   Procedure: AMPUTATION ABOVE KNEE;  Surgeon: Larina Earthly, MD;  Location[facility];  Service: Vascular;  Laterality: Right;  . AMPUTATION Left 04/04/2016   Procedure: LEFT FOURTH AND FIFTH  TOE AMPUTATION;  Surgeon: Larina Earthly, MD;  Location: [facility] OR;  Service: Vascular;  Laterality: Left;  . BELOW KNEE LEG AMPUTATION Right   . CORONARY ARTERY BYPASS GRAFT  2005  . ESOPHAGOGASTRODUODENOSCOPY N/A 05/17/2016   Procedure: ESOPHAGOGASTRODUODENOSCOPY (EGD);  Surgeon: Meryl Dare, MD;  Location[facility] ENDOSCOPY;  Service: Endoscopy;  Laterality: N/A;  . IR GENERIC HISTORICAL  07/07/2016   IR PARACENTESIS 07/07/2016 Gershon Crane, PA-C [facility]-INTERV RAD  . IR THORACENTESIS ASP PLEURAL SPACE W/IMG GUIDE  08/04/2016  . PERIPHERAL VASCULAR CATHETERIZATION N/A 01/11/2016   Procedure: Lower Extremity Angiography;  Surgeon: Nada Libman, MD;  Location: [facility] INVASIVE CV LAB;  Service: Cardiovascular;  Laterality: N/A;  . STERNOTOMY      Social History Social History  Substance Use Topics  . Smoking status: Current Every Day Smoker    Packs/day: 0.10    Years: 54.00    Types: Cigarettes  . Smokeless tobacco: Never Used  . Alcohol use Yes     Comment: 04/02/2016 "aint suppose to drink in the nursing home"    Family History Family History  Problem Relation Age of Onset  . Hypertension Other   . Diabetes Other   . Mental illness Other   . Lupus Other   . Diabetes Father   . Cancer Neg Hx   . Heart disease Neg Hx   . Stroke Neg Hx     Allergies  Allergen Reactions  . Percocet  [Oxycodone-Acetaminophen]     Needing narcan on multiple occasions due to oversedation after getting percocet   . Tramadol     Excess sedation; ? Related to pre-existing hepatic dysfunction with PMH Hep C     REVIEW OF SYSTEMS  General:  Weight loss,  Fever,   chills Neurologic:  Dizziness,  Blackouts,  Seizure  Stroke,  "Mini stroke",  Slurred speech,  Temporary blindness;  weakness in arms or legs,  Hoarseness  Dysphagia Cardiac: [x ] Chest pain/pressure,  Shortness of breath at rest  Shortness of breath with exertion, [x ] Atrial fibrillation or irregular heartbeat  Vascular:  Pain in legs with walking,  Pain in legs at rest,  Pain in legs at night,   Non-healing ulcer,  Blood clot in vein/DVT,   Pulmonary:  Home oxygen,  Productive cough,  Coughing up blood,  Asthma,   Wheezing  COPD Musculoskeletal:   Arthritis,  Low back pain,  Joint pain Hematologic:  Easy Bruising,  Anemia;  Hepatitis Gastrointestinal:  Blood in stool,  Gastroesophageal Reflux/heartburn, Urinary:  chronic Kidney disease,  on HD -  MWF or  TTHS,  Burning with urination,  Difficulty urinating Skin:  Rashes, [x ] Wounds Psychological:  Anxiety,  Depression  Physical Examination Vitals:   08/04/16 2320 08/05/16 0000 08/05/16 0400 08/05/16 0901  BP: (!) 148/84  139/76 (!) 142/85  Pulse:   88 85  Resp:   20   Temp:   98.4 F (36.9 C) 98 F (36.7 C)  TempSrc:   Oral Oral  SpO2: 99% 97% 97% 100%  Weight:   163 lb 3.2 oz (74 kg)   Height:       Body mass index is 24.81 kg/m.  General:  WDWN in NAD HENT: WNL Eyes: Pupils equal Pulmonary: normal non-labored breathing , without Rales, rhonchi,  wheezing Cardiac: RRR, without  Murmurs, rubs or gallops; Abdomen: soft, NT, no masses Skin: no rashes, ulcers noted;  no Gangrene , no cellulitis; left 4th toe amputation site with yellow  eschar, no purulent drainage open wounds;   Vascular Exam/Pulses:Palpable DP fait, Doppler DP/PT signals, no edema in left foot, no subcuticular fluid or erythema.   Musculoskeletal: no muscle wasting or atrophy; well healed right above knee amputation. Neurologic: A&O X 3; Appropriate Affect ;  SENSATION: normal; MOTOR FUNCTION: 5/5 Symmetric Speech is fluent/normal   Significant Diagnostic Studies: CBC Lab Results  Component Value Date   WBC 6.4 08/05/2016   HGB 8.4 (L) 08/05/2016   HCT 28.0 (L) 08/05/2016   MCV 86.2 08/05/2016   PLT 412 (H) 08/05/2016    BMET    Component Value Date/Time   NA 132 (L) 08/05/2016 0302   NA 135 (A) 07/24/2016   K 4.5 08/05/2016 0302   CL 97 (L) 08/05/2016 0302   CO2 27 08/05/2016 0302   GLUCOSE 153 (H) 08/05/2016 0302   BUN 29 (H) 08/05/2016 0302   BUN 38 (A) 07/24/2016   CREATININE 1.30 (H) 08/05/2016 0302   CALCIUM 8.8 (L) 08/05/2016 0302   GFRNONAA 55 (L) 08/05/2016 0302   GFRAA >60 08/05/2016 0302   Estimated Creatinine Clearance: 52.6 mL/min (A) (by C-G formula based on SCr of 1.3 mg/dL (H)).  COAG Lab Results  Component Value Date   INR 1.26 08/02/2016   INR 5.2 (A) 07/18/2016   INR 7.2 (A) 07/15/2016   PROTIME 48.3 (A) 07/18/2016   PROTIME 62.4 (A) 07/15/2016   PROTIME 31.5 (A) 07/10/2016     Non-Invasive Vascular Imaging:  Internal medicine has ordered a CT scan of the  left foot  ASSESSMENT/PLAN:  Healing 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 left foot amputation sites Currently on oral antibiotics Augmentin  Wet to dry dressing applied to left 4th toe amputation site We will follow his progress    Clinton Gallant [facility] 08/05/2016 10:02 AM  I agree with the above.  His wound is slowly healing.  I would recommend continued wound care and to keep follow up with Dr. Arbie Cookey.  Durene Cal

## 2016-08-05 NOTE — Evaluation (Signed)
Physical Therapy Evaluation Patient Details Name: Ronald Simpson MRN: [id number] DOB: [birth year] Today's Date: 08/05/2016   History of Present Illness  Pt is a 71 y.o. male admitted to ED on 08/02/16 from SNF with pleuritic chest pain. CXR 4/21 shows R and L pleural effusions with bibasilar opacities that may reflect edema, pneumonia, oratelectasis. Now s/p thoracentesis on 4/23. Pertinent PMH includes R AKA (08/2015), L foot ulcer (with L toe amputations), A-fib on coumadin, CAD, CABG, PVD, CVA, Hep-C.   Clinical Impression  Pt presents to PT with pain, generalized weakness, and an overall decrease in functional mobility secondary to above. PTA, pt lives at [facility] and is mod indep with manual w/c; receives assist for some ADLs. Reports he recently received prosthesis for R AKA and plans to work with PT at [facility] to start walking with it. Evaluation limited today to strength assessment in bed with pt adamantly refusing OOB mobility despite max encouragement and education on importance of mobility. Pt would benefit from continued acute services for further OOB mobility assessment.     Follow Up Recommendations SNF;Supervision for mobility/OOB    Equipment Recommendations  None recommended by PT    Recommendations for Other Services       Precautions / Restrictions Precautions Precautions: Fall Restrictions Weight Bearing Restrictions: No Other Position/Activity Restrictions: R AKA, L toe amputations      Mobility  Bed Mobility               General bed mobility comments: Pt refused mobility  Transfers                    Ambulation/Gait                Stairs            Wheelchair Mobility    Modified Rankin (Stroke Patients Only)       Balance                                             Pertinent Vitals/Pain Pain Assessment: Faces Faces Pain Scale: Hurts a little bit Pain Location: All over Pain Descriptors /  Indicators: Aching Pain Intervention(s): Monitored during session;Limited activity within patient's tolerance    Home Living Family/patient expects to be discharged to:: Skilled nursing facility               Home Equipment: Wheelchair - manual;Walker - 2 wheels      Prior Function Level of Independence: Needs assistance   Gait / Transfers Assistance Needed: States mod indep with lateral scoot into manual w/c and can self-propel. Recently got RLE prosthetic and plans to start walking with it once back at SNF.      Comments: Does not use O2 at baseline     Hand Dominance        Extremity/Trunk Assessment   Upper Extremity Assessment Upper Extremity Assessment: Generalized weakness    Lower Extremity Assessment Lower Extremity Assessment: Generalized weakness RLE Deficits / Details: R AKA with full hip ROM at least 3/5 LLE Deficits / Details: L toe amputations; LLE grossly 4/5 throughout       Communication   Communication: No difficulties  Cognition Arousal/Alertness: Awake/alert Behavior During Therapy: WFL for tasks assessed/performed Overall Cognitive Status: Within Functional Limits for tasks assessed  General Comments      Exercises     Assessment/Plan    PT Assessment Patient needs continued PT services  PT Problem List Decreased strength;Decreased mobility;Decreased activity tolerance;Decreased balance;Decreased knowledge of use of DME;Pain       PT Treatment Interventions DME instruction;Therapeutic exercise;Therapeutic activities;Stair training;Balance training;Gait training;Functional mobility training;Patient/family education    PT Goals (Current goals can be found in the Care Plan section)  Acute Rehab PT Goals Patient Stated Goal: Feel better PT Goal Formulation: With patient Time For Goal Achievement: 08/19/16 Potential to Achieve Goals: Fair    Frequency Min 2X/week   Barriers  to discharge Other (comment) Lack of motivation to participate    Co-evaluation               End of Session Equipment Utilized During Treatment: Oxygen Activity Tolerance: Patient limited by pain Patient left: in bed;with call bell/phone within reach Nurse Communication: Mobility status PT Visit Diagnosis: Other abnormalities of gait and mobility (R26.89);Muscle weakness (generalized) (M62.81)    Time: 1308-6578 PT Time Calculation (min) (ACUTE ONLY): 10 min   Charges:   PT Evaluation $PT Eval Low Complexity: 1 Procedure     PT G Codes:       Ronald Simpson, SPT [phone]  Ronald Simpson 08/05/2016, 3:48 PM

## 2016-08-05 NOTE — Care Management Note (Signed)
Case Management Note  Patient Details  Name: Ronald Simpson MRN: [id number] Date of Birth: Oct 05, 1945  Subjective/Objective:   Pt presented from [facility] for Pleuritic Chest Pain and Bilateral Pleural Effusions. S/p thoracentesis 08-04-16 and yielded 750 cc off.                  Action/Plan: CSW and CM will continue to monitor for disposition needs.   Expected Discharge Date:                  Expected Discharge Plan:  Skilled Nursing Facility  In-House Referral:  Clinical Social Work  Discharge planning Services  CM Consult  Post Acute Care Choice:  NA Choice offered to:  NA  DME Arranged:  N/A DME Agency:  NA  HH Arranged:  NA HH Agency:  NA  Status of Service:  In process, will continue to follow  If discussed at Long Length of Stay Meetings, dates discussed:    Additional Comments:  Gala Lewandowsky, RN 08/05/2016, 1:55 PM

## 2016-08-05 NOTE — Progress Notes (Addendum)
PROGRESS NOTE                                                                                                                                                                                                             Patient Demographics:    Ronald Simpson, is a 71 y.o. male, DOB - 1946/03/08, EAV:[id number]  Admit date - 08/02/2016   Admitting Physician Michael Litter, MD  Outpatient Primary MD for the patient is Marga Melnick, MD  LOS - 3  Outpatient Specialists:None  Chief Complaint  Patient presents with  . Chest Pain       Brief Narrative   71 year old male with history of CAD status post CABG, insulin-dependent diabetes with peripheral neuropathy, PVD with history of right AKA, nonhealing diabetic foot ulcer on the left foot status post partial left toe amputation, paroxysmal A. fib (previously on warfarin but held after GI bleed in February this year), prior CVA, hep C who presented from SN[facility] for pleuritic left-sided chest pain at rest. This was associated with dyspnea on exertion. Workup showed bilateral pleural effusions.     Subjective:   Symptoms much improved after thoracentesis yesterday. No further chest pain.   Assessment  & Plan :    Principal Problem:   Pleuritic chest pain With bilateral pleural effusions Large right and moderate left pleural effusions on CTA chest.Serial troponin negative, EKG unremarkable. Underwent right thoracentesis with 750 mL hazy, Amber/blood-tinged fluid removed. Symptoms quite improved following procedure. Cardiology added colchicine given pericardial rub on exam.Symptoms better.  Acute systolic CHF -2-D echo showing moderate to severely reduced EF of 35-40% with diffuse hypokinesis. Also has moderate mitral regurgitation. Improved urine output. Lasix held by cardiology for worsening renal function. Should benefit from transition to oral Lasix. Symptoms better after  thoracentesis. Continue aspirin, Coreg, statin. Continue Imdur. Add ACE inhibitor if renal function stable.       Active Problems:  Nonhealing diabetic left foot ulcer Underwent left fourth and fifth toe amputation in December 2017. Has purulent discharge from the amputation site. X-ray of the foot on admission cannot rule out possible osteomyelitis. Will order CT of the foot with contrast to rule out underlying abscess or osteomyelitis. Added empiric Augmentin and Consulted vascular surgery.  Acute kidney injury Mild possibly due to diuretic. IV Lasix on hold.   Paroxysmal A. fib Off  anticoagulation due to history of GI bleed, intracranial hemorrhage and cirrhosis. Currently in sinus rhythm. LA appendage finding concerning for left atrial thrombus seen on CT angiogram is likely related to clipping of appanage during CABG. (As seen on 2-D echo)  Essential hypertension/ Coronary artery disease status post CABG Continue aspirin, statin, Imdur and Coreg.     Diabetes mellitus with peripheral vascular disease (HCC) Continue Lantus with sliding scale coverage.   Normocytic anemia Chronic, hemoglobin at baseline.  Hepatitis C cirrhosis of liver Compensated. LFTs normal.  Hypothyroidism Continue Synthroid  Depression Continue Cymbalta       Code Status : Full Code  Family Communication  : None at bedside  Disposition Plan  : Pending clinical improvement, likely home in the next 24-48 hours  Barriers For Discharge : Active symptoms  Consults  :   Cardiology IR Vascular surgery  Procedures  :  CT angiogram of the chest 2-D echo  DVT Prophylaxis  :  Heparin  Lab Results  Component Value Date   PLT 412 (H) 08/05/2016    Antibiotics  :   Anti-infectives    Start     Dose/Rate Route Frequency Ordered Stop   08/05/16 1000  amoxicillin-clavulanate (AUGMENTIN) 875-125 MG per tablet 1 tablet     1 tablet Oral Every 12 hours 08/05/16 0949           Objective:   Vitals:   08/04/16 2320 08/05/16 0000 08/05/16 0400 08/05/16 0901  BP: (!) 148/84  139/76 (!) 142/85  Pulse:   88 85  Resp:   20   Temp:   98.4 F (36.9 C) 98 F (36.7 C)  TempSrc:   Oral Oral  SpO2: 99% 97% 97% 100%  Weight:   74 kg (163 lb 3.2 oz)   Height:        Wt Readings from Last 3 Encounters:  08/05/16 74 kg (163 lb 3.2 oz)  07/22/16 81.2 kg (179 lb)  07/17/16 81.2 kg (179 lb)     Intake/Output Summary (Last 24 hours) at 08/05/16 1130 Last data filed at 08/05/16 1610  Gross per 24 hour  Intake              517 ml  Output             1150 ml  Net             -633 ml     Physical Exam  Gen: not in distress HEENT: moist mucosa, supple neck, Chest: Improved Bilateral breath sounds, fine crackles CVS: N S1&S2, systolic murmur3/6 GI: soft, NT, ND,  Musculoskeletal: warm, right AKA, dressing over her left foot with purulent discharge from left fourth/fifth toe      Data Review:    CBC  Recent Labs Lab 08/02/16 1610 08/03/16 0312 08/04/16 1046 08/05/16 0302  WBC 8.0 6.4 7.1 6.4  HGB 9.1* 9.1* 8.5* 8.4*  HCT 30.0* 30.4* 29.1* 28.0*  PLT 437* 392 433* 412*  MCV 87.2 88.1 87.1 86.2  MCH 26.5 26.4 25.4* 25.8*  MCHC 30.3 29.9* 29.2* 30.0  RDW 18.1* 18.1* 17.7* 17.7*    Chemistries   Recent Labs Lab 08/02/16 1610 08/03/16 0312 08/04/16 1046 08/05/16 0302  NA 134* 133* 131* 132*  K 4.4 4.2 4.8 4.5  CL 99* 98* 97* 97*  CO2 GLUCOSE 121* 152* 144* 153*  BUN 24* 25* 26* 29*  CREATININE 1.09 1.14 1.25* 1.30*  CALCIUM 8.6* 8.5* 8.5* 8.8*  AST 19  --   --   --   ALT 8*  --   --   --   ALKPHOS 141*  --   --   --   BILITOT 1.6*  --   --   --    ------------------------------------------------------------------------------------------------------------------ No results for input(s): CHOL, HDL, LDLCALC, TRIG, CHOLHDL, LDLDIRECT in the last 72 hours.  Lab Results  Component Value Date   HGBA1C 6.8 07/03/2016    ------------------------------------------------------------------------------------------------------------------ No results for input(s): TSH, T4TOTAL, T3FREE, THYROIDAB in the last 72 hours.  Invalid input(s): FREET3 ------------------------------------------------------------------------------------------------------------------ No results for input(s): VITAMINB12, FOLATE, FERRITIN, TIBC, IRON, RETICCTPCT in the last 72 hours.  Coagulation profile  Recent Labs Lab 08/02/16 1610  INR 1.26     Recent Labs  08/02/16 1610  DDIMER 8.12*    Cardiac Enzymes  Recent Labs Lab 08/02/16 2050 08/03/16 0312  TROPONINI <0.03 <0.03   ------------------------------------------------------------------------------------------------------------------    Component Value Date/Time   BNP 1,021.2 (H) 08/02/2016 1610    Inpatient Medications  Scheduled Meds: . amoxicillin-clavulanate  1 tablet Oral Q12H  . aspirin EC  81 mg Oral Daily  . atorvastatin  20 mg Oral q1800  . carvedilol  6.25 mg Oral BID WC  . colchicine  0.6 mg Oral BID  . docusate sodium  100 mg Oral BID  . DULoxetine  30 mg Oral Daily  . feeding supplement (ENSURE ENLIVE)  237 mL Oral BID BM  . ferrous sulfate  325 mg Oral BID WC  . heparin  5,000 Units Subcutaneous Q8H  . hydrALAZINE  50 mg Oral TID  . insulin aspart  0-15 Units Subcutaneous TID WC  . insulin aspart  0-5 Units Subcutaneous QHS  . insulin glargine  17 Units Subcutaneous QHS  . isosorbide mononitrate  30 mg Oral Daily  . levothyroxine  25 mcg Oral QAC breakfast  . pantoprazole  40 mg Oral Daily  . polyethylene glycol  17 g Oral Daily   Continuous Infusions: PRN Meds:.acetaminophen **OR** acetaminophen, HYDROcodone-acetaminophen, ipratropium-albuterol, lidocaine, nitroGLYCERIN, ondansetron **OR** ondansetron (ZOFRAN) IV  Micro Results Recent Results (from the past 240 hour(s))  MRSA PCR Screening     Status: Abnormal   Collection Time:  08/03/16  1:22 AM  Result Value Ref Range Status   MRSA by PCR POSITIVE (A) NEGATIVE Final    Comment:        The GeneXpert MRSA Assay (FDA approved for NASAL specimens only), is one component of a comprehensive MRSA colonization surveillance program. It is not intended to diagnose MRSA infection nor to guide or monitor treatment for MRSA infections. RESULT CALLED TO, READ BACK BY AND VERIFIED WITH: MENDEZ,V RN [phone] AT 0344 SKEEN,P   Culture, body fluid-bottle     Status: None (Preliminary result)   Collection Time: 08/04/16  3:49 PM  Result Value Ref Range Status   Specimen Description FLUID  Final   Special Requests PLEURAL  Final   Culture NO GROWTH < 24 HOURS  Final   Report Status PENDING  Incomplete  Gram stain     Status: None   Collection Time: 08/04/16  3:49 PM  Result Value Ref Range Status   Specimen Description FLUID  Final   Special Requests PLEURAL  Final   Gram Stain   Final    WBC PRESENT, PREDOMINANTLY PMN NO ORGANISMS SEEN    Report Status 08/04/2016 FINAL  Final    Radiology Reports Dg Chest 1 View  Result Date: 08/04/2016 CLINICAL DATA:  Status  post right thoracentesis EXAM: CHEST 1 VIEW COMPARISON:  08/02/2016 chest radiograph. FINDINGS: Stable configuration of sternotomy wires and cardiac valvular prosthesis. Stable cardiomediastinal silhouette with mild cardiomegaly. No pneumothorax. No significant residual right pleural effusion. Trace left pleural effusion, stable. Borderline mild pulmonary edema appears improved. Improved aeration at the lung bases with decreased bibasilar atelectasis. IMPRESSION: 1. No pneumothorax. No significant residual right pleural effusion. Trace stable left pleural effusion. 2. Mild congestive heart failure, improved . 3. Improved aeration at the lung bases with decreased bibasilar atelectasis. Electronically Signed   By: Delbert Phenix M.D.   On: 08/04/2016 15:59   Dg Chest 2 View  Result Date: 08/02/2016 CLINICAL DATA:   Left-sided chest pain beginning last night. EXAM: CHEST  2 VIEW COMPARISON:  04/02/2016 FINDINGS: Sequelae of prior CABG and mitral valve repair are again identified. The cardiac silhouette remains mildly enlarged. Lung volumes are diminished with mild pulmonary vascular congestion. There are moderate right and small left pleural effusions with patchy bibasilar airspace opacities bilaterally. No acute osseous abnormality is seen. IMPRESSION: 1. Moderate right and small left pleural effusions. 2. Cardiomegaly and pulmonary vascular congestion. Bibasilar opacities may reflect edema, pneumonia, or atelectasis. Electronically Signed   By: Sebastian Ache M.D.   On: 08/02/2016 15:18   Ct Angio Chest Pe W Or Wo Contrast  Result Date: 08/02/2016 CLINICAL DATA:  71 year old male with history of shortness of breath and left-sided chest pain while breathing. EXAM: CT ANGIOGRAPHY CHEST WITH CONTRAST TECHNIQUE: Multidetector CT imaging of the chest was performed using the standard protocol during bolus administration of intravenous contrast. Multiplanar CT image reconstructions and MIPs were obtained to evaluate the vascular anatomy. CONTRAST:  80 mL of Isovue 370. COMPARISON:  No priors. FINDINGS: Cardiovascular: No filling defects within the pulmonary arterial tree to suggest underlying pulmonary embolism. Heart size is mildly enlarge. There is no significant pericardial fluid, thickening or pericardial calcification. There is aortic atherosclerosis, as well as atherosclerosis of the great vessels of the mediastinum and the coronary arteries, including calcified atherosclerotic plaque in the left main, left anterior descending, left circumflex and right coronary arteries. Status post median sternotomy for CABG, including LIMA to the LAD, as well as mitral annuloplasty. Left atrial appendage does not fill with contrast, concerning for thrombus. Mediastinum/Nodes: No pathologically enlarged mediastinal or hilar lymph nodes.  Esophagus is unremarkable in appearance. No axillary lymphadenopathy. Lungs/Pleura: Large right and moderate left pleural effusions with extensive passive atelectasis throughout the dependent portions of the lungs bilaterally. Right lower lobe is completely collapsed, as is much of the right middle lobe. No definite consolidative airspace disease. Within the aerated portions of the lungs there are no definite suspicious appearing pulmonary nodules or masses. Upper Abdomen: Aortic atherosclerosis. Musculoskeletal: Median sternotomy wires. There are no aggressive appearing lytic or blastic lesions noted in the visualized portions of the skeleton. Review of the MIP images confirms the above findings. IMPRESSION: 1. No evidence of pulmonary embolism. 2. Left atrial appendage does not fill with contrast, suggesting thrombus. This places the patient at risk for systemic embolization. Further evaluation with transesophageal echocardiography is suggested if clinically appropriate. 3. Large right and moderate left pleural effusions with extensive passive atelectasis in the dependent portions of the lungs bilaterally, as above. 4. Aortic atherosclerosis, in addition to left main and 3 vessel coronary artery disease. Status post median sternotomy for CABG including LIMA to the LAD. Electronically Signed   By: Trudie Reed M.D.   On: 08/02/2016 19:07   Dg Foot  2 Views Left  Result Date: 08/02/2016 CLINICAL DATA:  Acute onset of soft tissue swelling and erythema at the left foot. Assess for underlying abscess. Initial encounter. EXAM: LEFT FOOT - 2 VIEW COMPARISON:  CT of the left foot performed 04/03/2016 FINDINGS: Since the prior CT, the patient is status post resection at the fourth and fifth mid metatarsals. Mild overlying soft tissue swelling is suggested. Underlying residual osseous erosion cannot be excluded, as the resection sites are somewhat irregular in appearance, but this is difficult to fully assess on  radiograph. Diffuse vascular calcifications are seen. Scattered metallic BBs are noted within the ankle and foot. There is no evidence of acute fracture or dislocation. IMPRESSION: Note that evaluation for abscess is very limited on radiograph. Mild soft tissue swelling noted at the prior resection site at the fourth and fifth mid metatarsals. Underlying residual osseous erosion cannot be excluded, as the resection sites are somewhat irregular in appearance, but this is not well assessed on radiograph. Ultrasound could be considered to evaluate for abscess. Alternatively, contrast-enhanced CT or three-phase bone scan could be considered to assess for osteomyelitis, depending on the degree of clinical concern. Electronically Signed   By: Roanna Raider M.D.   On: 08/02/2016 21:40   Ir Paracentesis  Result Date: 07/07/2016 INDICATION: Chronic hepatitis C. Ascites. Request for diagnostic and therapeutic paracentesis. EXAM: ULTRASOUND GUIDED RIGHT LATERAL ABDOMEN PARACENTESIS MEDICATIONS: 1% Lidocaine. COMPLICATIONS: None immediate. PROCEDURE: Informed written consent was obtained from the patient after a discussion of the risks, benefits and alternatives to treatment. A timeout was performed prior to the initiation of the procedure. Initial ultrasound scanning demonstrates a small amount of ascites within the right lower abdominal quadrant. The right lower abdomen was prepped and draped in the usual sterile fashion. 1% lidocaine with epinephrine was used for local anesthesia. Following this, a 19 gauge, 7-cm, Yueh catheter was introduced. An ultrasound image was saved for documentation purposes. The paracentesis was performed. The catheter was removed and a dressing was applied. The patient tolerated the procedure well without immediate post procedural complication. FINDINGS: A total of approximately 960 mL of clear yellow fluid was removed. Samples were sent to the laboratory as requested by the clinical team.  IMPRESSION: Successful ultrasound-guided paracentesis yielding 960 mL of peritoneal fluid. Read by:  Corrin Parker, PA-C Electronically Signed   By: Jolaine Click M.D.   On: 07/07/2016 14:25   Ir Thoracentesis Asp Pleural Space W/img Guide  Result Date: 08/04/2016 INDICATION: Shortness of breath. Right-sided pleural effusion. Request diagnostic and therapeutic thoracentesis. EXAM: ULTRASOUND GUIDED RIGHT THORACENTESIS MEDICATIONS: None. COMPLICATIONS: None immediate. PROCEDURE: An ultrasound guided thoracentesis was thoroughly discussed with the patient and questions answered. The benefits, risks, alternatives and complications were also discussed. The patient understands and wishes to proceed with the procedure. Written consent was obtained. Ultrasound was performed to localize and mark an adequate pocket of fluid in the right chest. The area was then prepped and draped in the normal sterile fashion. 1% Lidocaine was used for local anesthesia. Under ultrasound guidance a Safe-T-Centesis catheter was introduced. Thoracentesis was performed. The catheter was removed and a dressing applied. FINDINGS: A total of approximately 750 mL of hazy, amber/blood-tinged fluid was removed. Samples were sent to the laboratory as requested by the clinical team. IMPRESSION: Successful ultrasound guided right thoracentesis yielding 750 mL of pleural fluid. Read by: Brayton El PA-C Electronically Signed   By: Richarda Overlie M.D.   On: 08/04/2016 16:30    Time Spent in minutes  25   Eddie North M.D on 08/05/2016 at 11:30 AM  Between 7am to 7pm - Pager - [phone]  After 7pm go to [email or web address] - password [facility]  [facility] -  Office  [phone]

## 2016-08-05 NOTE — Care Management Important Message (Signed)
Important Message  Patient Details  Name: Ronald Simpson MRN: [id number] Date of Birth: September 19, 1945   Medicare Important Message Given:  Yes    Gala Lewandowsky, RN 08/05/2016, 1:53 PM

## 2016-08-05 NOTE — Progress Notes (Signed)
Progress Note  Patient Name: Ronald Simpson Date of Encounter: 08/05/2016  Primary Cardiologist: Remotely seen in [place], previously moved to [place] but most recently has been getting care at [facility] now that he is staying near family  Subjective   Feels significantly better this AM (had thoracentesis yesterday and colchicine added). No further CP. "Slept like a rock." No orthopnea or dyspnea. Pending CT assessment of left foot wound.  Inpatient Medications    Scheduled Meds: . aspirin EC  81 mg Oral Daily  . atorvastatin  20 mg Oral q1800  . carvedilol  6.25 mg Oral BID WC  . colchicine  0.6 mg Oral BID  . docusate sodium  100 mg Oral BID  . DULoxetine  30 mg Oral Daily  . feeding supplement (ENSURE ENLIVE)  237 mL Oral BID BM  . ferrous sulfate  325 mg Oral BID WC  . furosemide  80 mg Intravenous Q12H  . heparin  5,000 Units Subcutaneous Q8H  . hydrALAZINE  50 mg Oral TID  . insulin aspart  0-15 Units Subcutaneous TID WC  . insulin aspart  0-5 Units Subcutaneous QHS  . insulin glargine  17 Units Subcutaneous QHS  . isosorbide mononitrate  30 mg Oral Daily  . levothyroxine  25 mcg Oral QAC breakfast  . pantoprazole  40 mg Oral Daily  . polyethylene glycol  17 g Oral Daily   Continuous Infusions:  PRN Meds: acetaminophen **OR** acetaminophen, HYDROcodone-acetaminophen, ipratropium-albuterol, lidocaine, nitroGLYCERIN, ondansetron **OR** ondansetron (ZOFRAN) IV   Vital Signs    Vitals:   08/04/16 2100 08/04/16 2320 08/05/16 0000 08/05/16 0400  BP: 129/76 (!) 148/84  139/76  Pulse: 88   88  Resp: 20   20  Temp: 98.4 F (36.9 C)   98.4 F (36.9 C)  TempSrc: Oral   Oral  SpO2: 92% 99% 97% 97%  Weight:    163 lb 3.2 oz (74 kg)  Height:        Intake/Output Summary (Last 24 hours) at 08/05/16 0824 Last data filed at 08/05/16 0634  Gross per 24 hour  Intake              994 ml  Output             1150 ml  Net             -156 ml   Filed Weights   08/03/16 0607  08/04/16 0421 08/05/16 0400  Weight: 165 lb 12.6 oz (75.2 kg) 166 lb (75.3 kg) 163 lb 3.2 oz (74 kg)    Telemetry    NSR - Personally Reviewed  Physical Exam   GEN: No acute distress.  HEENT: Normocephalic, atraumatic, sclera non-icteric. Neck: No JVD or bruits. Cardiac: RRR no murmurs, rubs, or gallops.  Radials/DP/PT 1+ and equal bilaterally.  Respiratory: Mildly diminished at bases, otherwise clear to auscultation bilaterally. Breathing is unlabored. GI: Soft, nontender, non-distended, BS +x 4. MS: prior RLE amputation otherwise no deformities Extremities: No clubbing or cyanosis. s/p R LE amputation. Wrapped L ankle wound Neuro:  AAOx3. Follows commands. Psych:  Responds to questions appropriately with a normal affect.  Labs    Chemistry Recent Labs Lab 08/02/16 1610 08/03/16 0312 08/04/16 1046 08/05/16 0302  NA 134* 133* 131* 132*  K 4.4 4.2 4.8 4.5  CL 99* 98* 97* 97*  CO2 GLUCOSE 121* 152* 144* 153*  BUN 24* 25* 26* 29*  CREATININE 1.09 1.14 1.25* 1.30*  CALCIUM 8.6*  8.5* 8.5* 8.8*  PROT 7.8  --   --   --   ALBUMIN 2.4*  --   --   --   AST 19  --   --   --   ALT 8*  --   --   --   ALKPHOS 141*  --   --   --   BILITOT 1.6*  --   --   --   GFRNONAA >60 >60 57* 55*  GFRAA >60 >60 >60 >60  ANIONGAP Hematology Recent Labs Lab 08/03/16 0312 08/04/16 1046 08/05/16 0302  WBC 6.4 7.1 6.4  RBC 3.45* 3.34* 3.25*  HGB 9.1* 8.5* 8.4*  HCT 30.4* 29.1* 28.0*  MCV 88.1 87.1 86.2  MCH 26.4 25.4* 25.8*  MCHC 29.9* 29.2* 30.0  RDW 18.1* 17.7* 17.7*  PLT 392 433* 412*    Cardiac Enzymes Recent Labs Lab 08/02/16 2050 08/03/16 0312  TROPONINI <0.03 <0.03    Recent Labs Lab 08/02/16 1608  TROPIPOC 0.00     BNP Recent Labs Lab 08/02/16 1610  BNP 1,021.2*     DDimer  Recent Labs Lab 08/02/16 1610  DDIMER 8.12*     Radiology    Dg Chest 1 View  Result Date: 08/04/2016 CLINICAL DATA:  Status post right  thoracentesis EXAM: CHEST 1 VIEW COMPARISON:  08/02/2016 chest radiograph. FINDINGS: Stable configuration of sternotomy wires and cardiac valvular prosthesis. Stable cardiomediastinal silhouette with mild cardiomegaly. No pneumothorax. No significant residual right pleural effusion. Trace left pleural effusion, stable. Borderline mild pulmonary edema appears improved. Improved aeration at the lung bases with decreased bibasilar atelectasis. IMPRESSION: 1. No pneumothorax. No significant residual right pleural effusion. Trace stable left pleural effusion. 2. Mild congestive heart failure, improved . 3. Improved aeration at the lung bases with decreased bibasilar atelectasis. Electronically Signed   By: Delbert Phenix M.D.   On: 08/04/2016 15:59   Ir Thoracentesis Asp Pleural Space W/img Guide  Result Date: 08/04/2016 INDICATION: Shortness of breath. Right-sided pleural effusion. Request diagnostic and therapeutic thoracentesis. EXAM: ULTRASOUND GUIDED RIGHT THORACENTESIS MEDICATIONS: None. COMPLICATIONS: None immediate. PROCEDURE: An ultrasound guided thoracentesis was thoroughly discussed with the patient and questions answered. The benefits, risks, alternatives and complications were also discussed. The patient understands and wishes to proceed with the procedure. Written consent was obtained. Ultrasound was performed to localize and mark an adequate pocket of fluid in the right chest. The area was then prepped and draped in the normal sterile fashion. 1% Lidocaine was used for local anesthesia. Under ultrasound guidance a Safe-T-Centesis catheter was introduced. Thoracentesis was performed. The catheter was removed and a dressing applied. FINDINGS: A total of approximately 750 mL of hazy, amber/blood-tinged fluid was removed. Samples were sent to the laboratory as requested by the clinical team. IMPRESSION: Successful ultrasound guided right thoracentesis yielding 750 mL of pleural fluid. Read by: Brayton El  PA-C Electronically Signed   By: Richarda Overlie M.D.   On: 08/04/2016 16:30    Cardiac Studies   Echo 08/03/16 Study Conclusions  - Left ventricle: The cavity size was normal. Wall thickness was increased in a pattern of mild LVH. Systolic function was moderately reduced. The estimated ejection fraction was in the range of 35% to 40%. Diffuse hypokinesis worse in inferior and inferolateral walls. - Ventricular septum: The contour showed diastolic flattening and systolic flattening. - Mitral valve: Calcified annulus. An annular ring prosthesis was present. There was moderate regurgitation. There is inflow turbulence across  the MV ring. Visually the pressure 1/2 time velocity looks mild but was not quantified. The gradient was not measured. Consider repeat imaging to further evalaute. - Left atrium: The atrium was moderately dilated. - Right ventricle: The cavity size was moderately dilated. Systolic function was moderately reduced. - Right atrium: The atrium was severely dilated. - Tricuspid valve: There was moderate regurgitation. - Pulmonary arteries: PA peak pressure: 43 mm Hg (S).  CT angio of chest 08/02/16 IMPRESSION: 1. No evidence of pulmonary embolism. 2. Left atrial appendage does not fill with contrast, suggesting thrombus. This places the patient at risk for systemic embolization. Further evaluation with transesophageal echocardiography is suggested if clinically appropriate. 3. Large right and moderate left pleural effusions with extensive passive atelectasis in the dependent portions of the lungs bilaterally, as above. 4. Aortic atherosclerosis, in addition to left main and 3 vessel coronary artery disease. Status post median sternotomy for CABG including LIMA to the LAD.  Patient Profile     71 y.o. male with PMH of CAD with remote CABG (unknown baseline EF), CVA, IDDM, Hep C/ETOH cirrhosis requiring prior paracentesis, PVD, right LE  amputation, PAF (not on anticoagulation likely due to h/o IC hemorrhage, prior GIB, cirrhosis), chronic anemia, and peripheral neuropathy who presented with pleuritic chest pain. D-dimer 8, BNP elevated, found to have bilateral pleural effusions (large R, moderate L) and pericardial rub. UA also showed large amount of proteinuria and CMET with hypoalbuminemia. CT also showed abnormality in the LAA which was felt due to prior clipping of the appendage at time of CABG, no further workup recommended. s/p thoracentesis 08/04/16.  Assessment & Plan    1. Pleuritic chest pain with bilateral pleural effusions - improved with thoracentesis and colchicine (started by Dr. Delton See for pericardial rub). Suspect his effusions are multifactorial related to his low albumin/cirrhosis as well as HF. Add serum LDH to be able to calculate Light's criteria on pleural fluid. Path pending. With rising Cr and improved symptoms, will hold further diuretic and review regimen with Dr. Delton See. May be able to transition to oral form (suspect some degree of CKD given marked Cr variation in the past).  2. 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 Negative troponins. See above. Hold off ACEI/ARB/spiro given his AKI this admission. Can consider as OP.  3. Paroxysmal atrial fibrillation with LAA findings as above - maintaining NSR. Not on AC as above.  4. Mitral regurgitation - annular ring prosthesis waspresent, moderate MR noted with inflow turbulence across the MV ring. Visually the pressure 1/2 time velocity looks mild but was not quantified. The gradient was not measured. Consider repeat imaging to further evaluate. Dr. Delton See to weigh on in value of TEE or continue medical therapy.  5. Acute kidney injury superimposed on probable CKD per labs (variable Cr) - hold diuretic and review with MD.  6. Cirrhosis  with hypoalbuminemia, also with proteinuria - likely contributing to above situation, per IM.  7. Left leg wound  - being managed by primary team.  Signed, Laurann Montana, PA-C  08/05/2016, 8:24 AM    The patient was seen, examined and discussed with Bhagat,Bhavinkumar PA-C and I agree with the above.   72 yo male with PMH of CAD with remote CABG, CVA, IDDM, Hep C with cirrhosis, PVD, Right LE amputation, PAF and peripheral neuropathy who presented with pleuritic chest pain in  the settings of bilateral pleural effusions. He has pericardial rub on physical exam, we added Colchicine 0.6 mg po BID to his regimen yesterday with improvement of symptoms. He has acute on chronic combined systolic and diastolic CHF, LVEF 35-40%, Crea 1.1-> 1.3, hold Lasix today, he is improved post  thoracentesis. He is post LAA clipping, thrombus is unlikely. We will follow.  Tobias Alexander, MD 08/05/2016

## 2016-08-06 ENCOUNTER — Telehealth: Payer: Self-pay | Admitting: *Deleted

## 2016-08-06 DIAGNOSIS — I10 Essential (primary) hypertension: Secondary | ICD-10-CM

## 2016-08-06 DIAGNOSIS — I2510 Atherosclerotic heart disease of native coronary artery without angina pectoris: Secondary | ICD-10-CM

## 2016-08-06 DIAGNOSIS — R071 Chest pain on breathing: Secondary | ICD-10-CM

## 2016-08-06 LAB — GLUCOSE, CAPILLARY
GLUCOSE-CAPILLARY: 145 mg/dL — AB (ref 65–99)
GLUCOSE-CAPILLARY: 105 mg/dL — AB (ref 65–99)
GLUCOSE-CAPILLARY: 156 mg/dL — AB (ref 65–99)
Glucose-Capillary: 103 mg/dL — ABNORMAL HIGH (ref 65–99)

## 2016-08-06 LAB — BASIC METABOLIC PANEL
Anion gap: 8 (ref 5–15)
BUN: 29 mg/dL — AB (ref 6–20)
CHLORIDE: 96 mmol/L — AB (ref 101–111)
CO2: 28 mmol/L (ref 22–32)
CREATININE: 1.17 mg/dL (ref 0.61–1.24)
Calcium: 8.5 mg/dL — ABNORMAL LOW (ref 8.9–10.3)
GFR calc Af Amer: >60 mL/min (ref >60)
GFR calc non Af Amer: >60 mL/min (ref >60)
Glucose, Bld: 162 mg/dL — ABNORMAL HIGH (ref 65–99)
POTASSIUM: 4.6 mmol/L (ref 3.5–5.1)
SODIUM: 132 mmol/L — AB (ref 135–145)

## 2016-08-06 MED ORDER — FUROSEMIDE 40 MG PO TABS
40.0000 mg | ORAL_TABLET | Freq: Every day | ORAL | Status: DC
  Administered 2016-08-06 – 2016-08-07 (×2): 40 mg via ORAL
  Filled 2016-08-06 (×2): qty 1

## 2016-08-06 NOTE — Clinical Social Work Note (Signed)
Clinical Social Work Assessment  Patient Details  Name: Ronald Simpson MRN: [id number] Date of Birth: March 22, 1946  Date of referral:  08/06/16               Reason for consult:  Discharge Planning                Permission sought to share information with:  Family Supports, Facility Medical sales representative Permission granted to share information::     Name::        Agency::  [facility]  Relationship::  daughter Ronald Simpson [phone], Anette Riedel [phone]  Contact Information:     Housing/Transportation Living arrangements for the past 2 months:  Skilled Nursing Facility Source of Information:  Medical Team, Adult Children, Facility Patient Interpreter Needed:  None Criminal Activity/Legal Involvement Pertinent to Current Situation/Hospitalization:  No - Comment as needed Significant Relationships:  Adult Children, Other Family Members, Community Support Lives with:  Facility Resident Do you feel safe going back to the place where you live?  Yes Need for family participation in patient care:  No (Coment)  Care giving concerns:  Pt from [facility] long term care. Spoke with Bjorn Loser at facility who states pt can return, updated her with skilled need per PT consult, Bjorn Loser confirms pt can return for skilled care.  Daughter Ronald Simpson states family is agreeable to pt returning to [place] at [place].   Social Worker assessment / plan:  LCSW consulted for SNF placement. Pt resides in SNF at [facility] for LTC. Facility confirms pt can return at [place].  Completed FL2 and submitted through the HUB. Will complete medical necessity form and arrange PTAR transport once pt ready for DC.   Plan: DC to return to [facility] at DC.   Employment status:  Retired Health and safety inspector:  Medicare PT Recommendations:  Skilled Nursing Facility Information / Referral to community resources:     Patient/Family's Response to care:  Appreciative of involvement  Patient/Family's Understanding of and  Emotional Response to Diagnosis, Current Treatment, and Prognosis:  Demonstrates adequate understanding of treatment/plan and positive about returning to [place]. Facility states, "We are happy to have him come back."  Emotional Assessment Appearance:  Appears stated age Attitude/Demeanor/Rapport:   (appropriate) Affect (typically observed):  Accepting, Calm Orientation:  Oriented to Self, Oriented to Place, Oriented to  Time, Oriented to Situation Alcohol / Substance use:  Not Applicable Psych involvement (Current and /or in the community):  No (Comment)  Discharge Needs  Concerns to be addressed:  No discharge needs identified Readmission within the last 30 days:  No Current discharge risk:  None Barriers to Discharge:  No Barriers Identified   Nelwyn Salisbury, LCSW 08/06/2016, 11:36 AM

## 2016-08-06 NOTE — NC FL2 (Signed)
Combine MEDICAID FL2 LEVEL OF CARE SCREENING TOOL     IDENTIFICATION  Patient Name: Ronald Simpson Birthdate: June 14, 1945 Sex: male Admission Date (Current Location): 08/02/2016  [facility] and [place] Number:  Producer, television/film/video and Address:  [facility], [address], [place], [place] [address]      Provider Number: [id number]  Attending Physician Name and Address:  Richarda Overlie, MD  Relative Name and Phone Number:       Current Level of Care: Hospital Recommended Level of Care: Skilled Nursing Facility Prior Approval Number:    Date Approved/Denied:   PASRR Number:    Discharge Plan: SNF    Current Diagnoses: Patient Active Problem List   Diagnosis Date Noted  . Acute on chronic combined systolic and diastolic CHF (congestive heart failure) (HCC) 08/05/2016  . Acute kidney injury superimposed on chronic kidney disease (HCC) 08/05/2016  . Chest pain 08/02/2016  . Pleuritic chest pain 08/02/2016  . Pleural effusion, bilateral 08/02/2016  . Elevated brain natriuretic peptide (BNP) level 08/02/2016  . Chronic anemia 07/22/2016  . Supratherapeutic international normalized ratio (INR) 07/17/2016  . Ascites 06/27/2016  . Chronic anticoagulation   . Cirrhosis (HCC)   . Acute GI bleeding 05/15/2016  . Melanotic stools 05/15/2016  . Altered mental status 04/10/2016  . Gangrene of toe of left foot (HCC)   . Cystitis   . 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 At risk for adverse drug event 02/14/2016  . Peripheral neuropathy 01/31/2016  . Scrotal edema 01/15/2016  . Dry gangrene (HCC) 01/15/2016  . Renal insufficiency 01/15/2016  . PAD (peripheral artery disease) (HCC) 01/11/2016  . 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 Tobacco abuse   . Tachycardia   . Hyponatremia   . Hypoalbuminemia   . MRSA cellulitis of left foot   . Serratia marcescens infection  (HCC)   . Escherichia coli infection   . CAD in native artery   . Paroxysmal atrial fibrillation (HCC)   . Uncontrolled type 2 diabetes mellitus with complication (HCC)   . Hepatitis C without hepatic coma 12/27/2015  . Coronary artery disease due to lipid rich plaque 12/27/2015  . Diabetes mellitus with peripheral vascular disease (HCC) 12/27/2015  . History of CVA (cerebrovascular accident) 12/27/2015  . AKI (acute kidney injury) (HCC) 12/27/2015  . Hypothyroidism, acquired 12/27/2015  . Benign essential HTN 12/27/2015  . Sepsis (HCC) 12/26/2015    Orientation RESPIRATION BLADDER Height & Weight     Self, Time, Situation, Place  O2 (2L) Continent Weight: 158 lb 3.2 oz (71.8 kg) Height:   (172.7 cm) (R AKA)  BEHAVIORAL SYMPTOMS/MOOD NEUROLOGICAL BOWEL NUTRITION STATUS      Incontinent Diet (low carb)  AMBULATORY STATUS COMMUNICATION OF NEEDS Skin   Extensive Assist Verbally Surgical wounds   Reason for Consult: Consult requested for left foot.  Pt previously had surgery with Dr Arbie Cookey of the vascular team for amputation of 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s, according to the EMR. Wound type: Full thickness wound has developed on the inner area of the 3rd toe. Measurement: .3X.3cm dry eschar to lower area, 2.5X1.2X.6cm to upper area Wound bed: 100% loose yellow eschar Drainage (amount, consistency, odor) Mod amt tan drainage and strong foul odor Periwound: Intact skin surrounding Dressing procedure/placement/frequency: Moist gauze packing until further input is available from vascular team.  Personal Care Assistance Level of Assistance  Bathing, Feeding, Dressing Bathing Assistance: Limited assistance Feeding assistance: Independent Dressing Assistance: Limited assistance     Functional Limitations Info  Sight, Hearing, Speech Sight Info: Adequate Hearing Info: Adequate Speech Info: Adequate    SPECIAL CARE FACTORS FREQUENCY  PT (By licensed PT), OT (By  licensed OT)     PT Frequency: 5x OT Frequency: 5x            Contractures Contractures Info: Not present    Additional Factors Info  Allergies, Code Status  Isolation precautions Code Status Info: full Allergies Info:  Percocet Oxycodone-acetaminophen, Tramadol  MRSA         Current Medications (08/06/2016):  This is the current hospital active medication list Current Facility-Administered Medications  Medication Dose Route Frequency Provider Last Rate Last Dose  . acetaminophen (TYLENOL) tablet 650 mg  650 mg Oral Q6H PRN Michael Litter, MD       Or  . acetaminophen (TYLENOL) suppository 650 mg  650 mg Rectal Q6H PRN Michael Litter, MD      . amoxicillin-clavulanate (AUGMENTIN) 875-125 MG per tablet 1 tablet  1 tablet Oral Q12H Nishant Dhungel, MD   1 tablet at 08/06/16 0821  . aspirin EC tablet 81 mg  81 mg Oral Daily Michael Litter, MD   81 mg at 08/06/16 0820  . atorvastatin (LIPITOR) tablet 20 mg  20 mg Oral q1800 Michael Litter, MD   20 mg at 08/05/16 1842  . carvedilol (COREG) tablet 6.25 mg  6.25 mg Oral BID WC Michael Litter, MD   6.25 mg at 08/06/16 0820  . Chlorhexidine Gluconate Cloth 2 % PADS 6 each  6 each Topical Q0600 Nishant Dhungel, MD   6 each at 08/06/16 0531  . colchicine tablet 0.6 mg  0.6 mg Oral BID Lars Masson, MD   0.6 mg at 08/06/16 4098  . docusate sodium (COLACE) capsule 100 mg  100 mg Oral BID Michael Litter, MD   100 mg at 08/06/16 1191  . DULoxetine (CYMBALTA) DR capsule 30 mg  30 mg Oral Daily Michael Litter, MD   30 mg at 08/06/16 0819  . feeding supplement (ENSURE ENLIVE) (ENSURE ENLIVE) liquid 237 mL  237 mL Oral BID BM Maryann Mikhail, DO   237 mL at 08/05/16 1620  . ferrous sulfate tablet 325 mg  325 mg Oral BID WC Michael Litter, MD   325 mg at 08/06/16 0821  . heparin injection 5,000 Units  5,000 Units Subcutaneous Q8H Dolores Patty, MD   5,000 Units at 08/06/16 0530  . hydrALAZINE (APRESOLINE) tablet 50 mg  50 mg Oral TID Michael Litter, MD    50 mg at 08/06/16 4782  . HYDROcodone-acetaminophen (NORCO/VICODIN) 5-325 MG per tablet 1 tablet  1 tablet Oral Q6H PRN Michael Litter, MD   1 tablet at 08/04/16 2346  . insulin aspart (novoLOG) injection 0-15 Units  0-15 Units Subcutaneous TID WC Michael Litter, MD   2 Units at 08/06/16 [phone]  . insulin aspart (novoLOG) injection 0-5 Units  0-5 Units Subcutaneous QHS Michael Litter, MD      . insulin glargine (LANTUS) injection 17 Units  17 Units Subcutaneous QHS Michael Litter, MD   17 Units at 08/05/16 2230  . ipratropium-albuterol (DUONEB) 0.5-2.5 (3) MG/3ML nebulizer solution 3 mL  3 mL Nebulization Q6H PRN Michael Litter, MD      . isosorbide mononitrate (IMDUR) 24 hr tablet 30 mg  30 mg Oral Daily Michael Litter, MD  30 mg at 08/06/16 0820  . levothyroxine (SYNTHROID, LEVOTHROID) tablet 25 mcg  25 mcg Oral QAC breakfast Michael Litter, MD   25 mcg at 08/06/16 [phone]  . lidocaine (XYLOCAINE) 1 % (with pres) injection    PRN Brayton El, PA-C   10 mL at 08/04/16 1530  . mupirocin ointment (BACTROBAN) 2 % 1 application  1 application Nasal BID Nishant Dhungel, MD   1 application at 08/06/16 0823  . nitroGLYCERIN (NITROSTAT) SL tablet 0.4 mg  0.4 mg Sublingual Q5 min PRN Michael Litter, MD      . ondansetron [facility]) tablet 4 mg  4 mg Oral Q6H PRN Michael Litter, MD       Or  . ondansetron [facility]) injection 4 mg  4 mg Intravenous Q6H PRN Michael Litter, MD      . pantoprazole (PROTONIX) EC tablet 40 mg  40 mg Oral Daily Michael Litter, MD   40 mg at 08/06/16 0820  . polyethylene glycol (MIRALAX / GLYCOLAX) packet 17 g  17 g Oral Daily Michael Litter, MD   17 g at 08/06/16 1191     Discharge Medications: Please see discharge summary for a list of discharge medications.  Relevant Imaging Results:  Relevant Lab Results:   Additional Information SS#[id number]  Nelwyn Salisbury, LCSW

## 2016-08-06 NOTE — Progress Notes (Signed)
F/u appt placed on AVS for 5/1.

## 2016-08-06 NOTE — Telephone Encounter (Signed)
-----   Message from Dewain Penning sent at 08/06/2016  1:08 PM EDT ----- Regarding: toc call Pt will need TOC call  Thanks Bethann Berkshire

## 2016-08-06 NOTE — Consult Note (Signed)
    Subjective  -   No complaints today   Physical Exam:  Dressing to left foot changes.  Still with residual wound at base of amputation site.  No erythema or drainage       Assessment/Plan:    Patient continues to slowly heal his amputation site.  Continue with wound care Follow up with Dr. Arbie Cookey in 1 month  Durene Cal 08/06/2016 8:01 AM --  Vitals:   08/05/16 2100 08/06/16 0500  BP: 129/73 130/74  Pulse: 79 80  Resp: 15 (!) 24  Temp: 98.3 F (36.8 C) 98.6 F (37 C)    Intake/Output Summary (Last 24 hours) at 08/06/16 0801 Last data filed at 08/06/16 0600  Gross per 24 hour  Intake              565 ml  Output              400 ml  Net              165 ml     Laboratory CBC    Component Value Date/Time   WBC 6.4 08/05/2016 0302   HGB 8.4 (L) 08/05/2016 0302   HCT 28.0 (L) 08/05/2016 0302   PLT 412 (H) 08/05/2016 0302    BMET    Component Value Date/Time   NA 132 (L) 08/05/2016 0302   NA 135 (A) 07/24/2016   K 4.5 08/05/2016 0302   CL 97 (L) 08/05/2016 0302   CO2 27 08/05/2016 0302   GLUCOSE 153 (H) 08/05/2016 0302   BUN 29 (H) 08/05/2016 0302   BUN 38 (A) 07/24/2016   CREATININE 1.30 (H) 08/05/2016 0302   CALCIUM 8.8 (L) 08/05/2016 0302   GFRNONAA 55 (L) 08/05/2016 0302   GFRAA >60 08/05/2016 0302    COAG Lab Results  Component Value Date   INR 1.26 08/02/2016   INR 5.2 (A) 07/18/2016   INR 7.2 (A) 07/15/2016   PROTIME 48.3 (A) 07/18/2016   PROTIME 62.4 (A) 07/15/2016   PROTIME 31.5 (A) 07/10/2016   No results found for: PTT  Antibiotics Anti-infectives    Start     Dose/Rate Route Frequency Ordered Stop   08/05/16 1000  amoxicillin-clavulanate (AUGMENTIN) 875-125 MG per tablet 1 tablet     1 tablet Oral Every 12 hours 08/05/16 0949         V. Charlena Cross, M.D. [facility] Office: [phone] Pager:  [phone]
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
## 2016-08-07 LAB — CHOLESTEROL, BODY FLUID: CHOL FL: 32 mg/dL

## 2016-08-07 LAB — COMPREHENSIVE METABOLIC PANEL
ALK PHOS: 146 U/L — AB (ref 38–126)
ALT: 15 U/L — AB (ref 17–63)
AST: 45 U/L — ABNORMAL HIGH (ref 15–41)
Albumin: 2.4 g/dL — ABNORMAL LOW (ref 3.5–5.0)
Anion gap: 9 (ref 5–15)
BILIRUBIN TOTAL: 0.7 mg/dL (ref 0.3–1.2)
BUN: 30 mg/dL — ABNORMAL HIGH (ref 6–20)
CALCIUM: 8.6 mg/dL — AB (ref 8.9–10.3)
CHLORIDE: 96 mmol/L — AB (ref 101–111)
CO2: 25 mmol/L (ref 22–32)
CREATININE: 1.15 mg/dL (ref 0.61–1.24)
Glucose, Bld: 135 mg/dL — ABNORMAL HIGH (ref 65–99)
Potassium: 4.9 mmol/L (ref 3.5–5.1)
Sodium: 130 mmol/L — ABNORMAL LOW (ref 135–145)
TOTAL PROTEIN: 7.8 g/dL (ref 6.5–8.1)

## 2016-08-07 LAB — GLUCOSE, CAPILLARY
GLUCOSE-CAPILLARY: 138 mg/dL — AB (ref 65–99)
Glucose-Capillary: 124 mg/dL — ABNORMAL HIGH (ref 65–99)

## 2016-08-07 MED ORDER — AMOXICILLIN-POT CLAVULANATE 875-125 MG PO TABS: 1.0000 | ORAL_TABLET | Freq: Two times a day (BID) | ORAL | 0 refills | Status: DC

## 2016-08-07 MED ORDER — HYDROCODONE-ACETAMINOPHEN 5-325 MG PO TABS: 1.0000 | ORAL_TABLET | Freq: Four times a day (QID) | ORAL | 0 refills | Status: DC | PRN

## 2016-08-07 MED ORDER — DOXYCYCLINE HYCLATE 50 MG PO CAPS: 100.0000 mg | ORAL_CAPSULE | Freq: Two times a day (BID) | ORAL | 0 refills | Status: AC

## 2016-08-07 MED ORDER — COLCHICINE 0.6 MG PO TABS: 0.6000 mg | ORAL_TABLET | Freq: Two times a day (BID) | ORAL | 0 refills | Status: DC

## 2016-08-07 MED ORDER — INSULIN ASPART 100 UNIT/ML ~~LOC~~ SOLN: SUBCUTANEOUS | 11 refills | Status: DC

## 2016-08-07 MED ORDER — ENSURE ENLIVE PO LIQD: 237.0000 mL | Freq: Two times a day (BID) | ORAL | 12 refills | Status: DC

## 2016-08-07 MED ORDER — ASPIRIN 81 MG PO TBEC: 81.0000 mg | DELAYED_RELEASE_TABLET | Freq: Every day | ORAL | 1 refills | Status: AC

## 2016-08-07 NOTE — Progress Notes (Signed)
Clinical Social Worker facilitated patient discharge including contacting patient family and facility to confirm patient discharge plans.  Clinical information faxed to facility and family agreeable with plan.  CSW arranged ambulance transport via [facility] to [place] .  RN to call [phone] (pt will go in room 113) report prior to discharge.  Clinical Social Worker will sign off for now as social work intervention is no longer needed. Please consult [place] again if new need arises.  Marrianne Mood, MSW, [employer] [phone]

## 2016-08-07 NOTE — Telephone Encounter (Signed)
Per [facility] patient still admitted this morning.  TOC call- 4/27.

## 2016-08-07 NOTE — Discharge Summary (Addendum)
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 [phone]        Unice Cobble, MD. Call.   Specialty:  Internal Medicine Why:  Hospital follow-up in 3-5 days Contact information:  [place] [address] [phone]            Contact information for after-discharge care    Destination    [facility] Follow up.   Specialty:  [place] information: [address] [place] [phone]                  Signed: Reyne Dumas 08/07/2016, 12:32 PM        Time spent >45 mins

## 2016-08-07 NOTE — Progress Notes (Signed)
The patient is being discharge. OK from cardiology standpoint, we will arrange a follow up.  Tobias Alexander, MD 08/07/2016

## 2016-08-07 NOTE — Progress Notes (Signed)
Patient received all discharge instructions and education. He verbalized understanding. Report was called to the RN at his facility. All patient belongings and discharge papers were given to his transporters.

## 2016-08-08 ENCOUNTER — Encounter: Payer: Self-pay | Admitting: Physician Assistant

## 2016-08-08 ENCOUNTER — Telehealth: Payer: Self-pay

## 2016-08-08 NOTE — Telephone Encounter (Signed)
This is a patient of [facility], who was admitted to [facility] after hospitalization. [facility] F/U is needed. Hospital discharge from [facility] on 08/07/2016

## 2016-08-08 NOTE — Telephone Encounter (Signed)
Left message to call back While looking through chart noted pt was admitted to [facility] after hospitalization.

## 2016-08-09 LAB — CULTURE, BODY FLUID-BOTTLE: CULTURE: NO GROWTH

## 2016-08-09 LAB — CULTURE, BODY FLUID W GRAM STAIN -BOTTLE

## 2016-08-11 ENCOUNTER — Encounter: Payer: Self-pay | Admitting: Physician Assistant

## 2016-08-11 NOTE — Progress Notes (Signed)
Cardiology Office Note    Date:  08/12/2016  ID:  Ronald Simpson, DOB 1946-04-07, MRN [id number] PCP:  Marga Melnick, MD  Cardiologist:  Dr. Delton See (previously [place], [place])   Chief Complaint: f/u CHF  History of Present Illness:  Ronald Simpson is a 71 y.o. male with history of CAD with remote CABG (unknown baseline EF but suspected chronic combined CHF based on med list), mitral regurgitation s/p prior annular ring prosthesis, CVA, IDDM, Hep C/ETOH cirrhosis requiring prior paracentesis, PVD, right LE amputation, PAF (not on anticoagulation likely due to h/o IC hemorrhage, prior GIB, cirrhosis), chronic anemia, probable CKD stage III per labs, peripheral neuropathy who presents for post-hospital follow-up. He was recently admitted 07/2016 with pleuritic chest pain. D-dimer 8, BNP elevated, found to have bilateral pleural effusions (large R, moderate L) and pericardial rub. UA also showed large amount of proteinuria and CMET with hypoalbuminemia. CT also showed abnormality in the LAA which was felt due to prior clipping of the appendage at time of CABG, no further workup recommended. Symptoms significantly improved with thoracentesis 08/04/16 for his effusions as well as colchicine for pleuritic pain. Path neg for malignancy. Vascular followed for leg wound. He maintained NSR throughout this stay. 2D echo 08/03/16 showed mild LVH, EF 35-40%, diffuse HK worse in inferior and inferolateral walls, diastolic and systolic flattening of ventricular septum, moderate mitral regurgitation ("there is inflowturbulence across the MV ring. Visually the pressure 1/2 timevelocity looks mild but was not quantified. The gradient was not measured. Consider repeat imaging to further evalaute," moderate RV dysfunction, mod dilated RV, severe RAE, PASP 43. D/c labs showed NA 130, K 4.9, Cr 1.15, albumin 2.4, Hgb 8.4, plt 412.  He presents back today for follow-up. He was brought here by his facility, [place]. He's here  alone. They are managing his medications. He states he's doing fairly well. He denies any intercurrent worsening of SOB or recurrent chest pain. No significant edema. He is going to be working with PT twice a day there and will possibly be getting a prosthetic leg. He states they are weighing him daily at the facility and weight yesterday was 164lb. Weights prior to DC varied from 158-167lb.    Past Medical History:  Diagnosis Date  . CAD (coronary artery disease)    a. remote CABG.  . Chronic anemia   . Chronic combined systolic and diastolic CHF (congestive heart failure) (HCC)   . Cirrhosis (HCC)   . CKD (chronic kidney disease), stage III   . CVA (cerebral infarction)   . DM (diabetes mellitus), type 2 with peripheral vascular complications (HCC)   . History of hepatitis C 1990s   Hep B immune  . Hypoalbuminemia   . IDDM (insulin dependent diabetes mellitus) (HCC)   . Mitral regurgitation    a. s/p prior ring annuloplasty prosthesis per echo with mod MR by echo 07/2016.  Marland Kitchen PAF (paroxysmal atrial fibrillation) (HCC)    a. not on anticoagulation likely due to h/o IC hemorrhage, prior GIB, cirrhosis. b. suspected prior LAA clipping based on imaging.  . Peripheral neuropathy   . Peripheral vascular disease (HCC)   . Proteinuria     Past Surgical History:  Procedure Laterality Date  . AMPUTATION Right 01/07/2016   Procedure: AMPUTATION ABOVE KNEE;  Surgeon: Larina Earthly, MD;  Location: [facility] OR;  Service: Vascular;  Laterality: Right;  . AMPUTATION Left 04/04/2016   Procedure: LEFT FOURTH AND FIFTH  TOE AMPUTATION;  Surgeon: Larina Earthly, MD;  Location: [facility] OR;  Service: Vascular;  Laterality: Left;  . BELOW KNEE LEG AMPUTATION Right   . CORONARY ARTERY BYPASS GRAFT  2005  . ESOPHAGOGASTRODUODENOSCOPY N/A 05/17/2016   Procedure: ESOPHAGOGASTRODUODENOSCOPY (EGD);  Surgeon: Meryl Dare, MD;  Location[facility] ENDOSCOPY;  Service: Endoscopy;  Laterality: N/A;  . IR GENERIC HISTORICAL   07/07/2016   IR PARACENTESIS 07/07/2016 Gershon Crane, PA-C [facility]-INTERV RAD  . IR THORACENTESIS ASP PLEURAL SPACE W/IMG GUIDE  08/04/2016  . PERIPHERAL VASCULAR CATHETERIZATION N/A 01/11/2016   Procedure: Lower Extremity Angiography;  Surgeon: Nada Libman, MD;  [facility] INVASIVE CV LAB;  Service: Cardiovascular;  Laterality: N/A;  . STERNOTOMY      Current Medications: Current Outpatient Prescriptions  Medication Sig Dispense Refill  . Amino Acids-Protein Hydrolys (FEEDING SUPPLEMENT, PRO-STAT SUGAR FREE 64,) LIQD Take 30 mLs by mouth 2 (two) times daily.    Marland Kitchen amoxicillin-clavulanate (AUGMENTIN) 875-125 MG tablet Take 1 tablet by mouth every 12 (twelve) hours. 20 tablet 0  . aspirin EC 81 MG EC tablet Take 1 tablet (81 mg total) by mouth daily. 30 tablet 1  . atorvastatin (LIPITOR) 20 MG tablet Take 20 mg by mouth daily.    . barrier cream (NON-SPECIFIED) CREA Apply 1 application topically 2 (two) times daily.    . carvedilol (COREG) 6.25 MG tablet Take 6.25 mg by mouth 2 (two) times daily with a meal.    . colchicine 0.6 MG tablet Take 1 tablet (0.6 mg total) by mouth 2 (two) times daily. 60 tablet 0  . docusate sodium (COLACE) 100 MG capsule Take 100 mg by mouth 2 (two) times daily.    Marland Kitchen doxycycline (VIBRAMYCIN) 50 MG capsule Take 2 capsules (100 mg total) by mouth 2 (two) times daily. 40 capsule 0  . DULoxetine (CYMBALTA) 30 MG capsule Take 30 mg by mouth daily.    . feeding supplement, ENSURE ENLIVE, (ENSURE ENLIVE) LIQD Take 237 mLs by mouth 2 (two) times daily between meals. 237 mL 12  . ferrous sulfate 325 (65 FE) MG tablet Take 325 mg by mouth 2 (two) times daily with a meal.    . furosemide (LASIX) 40 MG tablet Take 40 mg by mouth daily.     . hydrALAZINE (APRESOLINE) 50 MG tablet Take 50 mg by mouth 3 (three) times daily.    Marland Kitchen HYDROcodone-acetaminophen (NORCO/VICODIN) 5-325 MG tablet Take 1 tablet by mouth every 6 (six) hours as needed for moderate pain. 10 tablet 0  .  insulin aspart (NOVOLOG) 100 UNIT/ML injection Question Answer Comment Correction coverage: Moderate (average weight, post-op)  CBG < 70: implement hypoglycemia protocol  CBG 70 - 120: 0 units  CBG 121 - 150: 2 units  CBG 151 - 200: 3 units  CBG 201 - 250: 5 units  CBG 251 - 300: 8 units  CBG 301 - 350: 11 units  CBG 351 - 400: 15 units  CBG > 400 call MD and obtain STAT lab verification 10 mL 11  . insulin glargine (LANTUS) 100 unit/mL SOPN Inject 17 Units into the skin at bedtime.    Marland Kitchen ipratropium-albuterol (DUONEB) 0.5-2.5 (3) MG/3ML SOLN Take 3 mLs by nebulization every 6 (six) hours as needed (shortness of breath).     . isosorbide mononitrate (IMDUR) 30 MG 24 hr tablet Take 1 tablet (30 mg total) by mouth daily. 30 tablet 1  . levothyroxine (SYNTHROID, LEVOTHROID) 25 MCG tablet Take 25 mcg by mouth daily before breakfast.    . nitroGLYCERIN (NITROSTAT)  0.4 MG SL tablet Place 0.4 mg under the tongue every 5 (five) minutes as needed for chest pain.    . OXYGEN Inhale into the lungs. 2lpm via [place] prn for shortness of breath or O2 sat <90%    . pantoprazole (PROTONIX) 40 MG tablet Take 1 tablet (40 mg total) by mouth daily. 30 tablet 3  . polyethylene glycol (MIRALAX / GLYCOLAX) packet Take 17 g by mouth daily.     No current facility-administered medications for this visit.      Allergies:   Percocet [oxycodone-acetaminophen] and Tramadol   Social History   Social History  . Marital status: Widowed    Spouse name: N/A  . Number of children: N/A  . Years of education: N/A   Social History Main Topics  . Smoking status: Current Every Day Smoker    Packs/day: 0.10    Years: 54.00    Types: Cigarettes  . Smokeless tobacco: Never Used  . Alcohol use Yes     Comment: 04/02/2016 "aint suppose to drink in the nursing home"  . Drug use: Yes    Types: Cocaine, Marijuana     Comment: marijuana use daily, rare cocain use.  Previously did snort and inject drugs through 2017. Hasn't  had access to street drugs from 10/201 7 through February/201 8  . Sexual activity: Not Currently   Other Topics Concern  . None   Social History Narrative   As of fall/2017 patient had been living in [place] but then entered SNF in [place]. His wife passed away in summer 2017.     Family History:  Family History  Problem Relation Age of Onset  . Hypertension Other   . Diabetes Other   . Mental illness Other   . Lupus Other   . Diabetes Father   . Cancer Neg Hx   . Heart disease Neg Hx   . Stroke Neg Hx     ROS:   Please see the history of present illness.  All other systems are reviewed and otherwise negative.    PHYSICAL EXAM:   VS:  BP 140/90   Pulse 85   SpO2 98%   Could not weigh patient due to amputation and lack of stability. GEN: Well nourished, well developed AAM, in no acute distress  HEENT: normocephalic, atraumatic Neck: no JVD, carotid bruits, or masses Cardiac: RRR; no murmurs, rubs, or gallops, no edema  Respiratory: mildly diminished BS right lung base, otherwise clear to auscultation bilaterally, normal work of breathing GI: soft, nontender, nondistended, + BS MS: s/p RLE amputation Skin: warm and dry, no rash Neuro:  Alert and Oriented x 3, Strength and sensation are intact, follows commands Psych: euthymic mood, full affect  Wt Readings from Last 3 Encounters:  08/07/16 167 lb 14.4 oz (76.2 kg)  07/22/16 179 lb (81.2 kg)  07/17/16 179 lb (81.2 kg)      Studies/Labs Reviewed:   EKG:  EKG was not ordered today  Recent Labs: 07/18/2016: TSH 2.74 08/02/2016: B Natriuretic Peptide 1,021.2 08/05/2016: Hemoglobin 8.4; Platelets 412 08/07/2016: ALT 15; BUN 30; Creatinine, Ser 1.15; Potassium 4.9; Sodium 130   Lipid Panel    Component Value Date/Time   CHOL 88 06/11/2016   TRIG 84 06/11/2016   HDL 42 06/11/2016   LDLCALC 30 06/11/2016    Additional studies/ records that were reviewed today include: Summarized above    ASSESSMENT &  PLAN:   1. Chronic combined CHF with recent bilateral pleural effusions s/p thoracentesis -  suspect significant contribution from his cirrhosis and low albumin state as well. He appears euvolemic in clinic today. Would continue current regimen. Would recommend  sodium diet and 2L fluid restriction for patient to ward off recurrent volume retention. If weight increases by 3lb overnight or 5lb in span of several days, would recommend prompt call to our office for diuretic titration. Have written these instructions on his facility consultation report and will attach copy of this note as well. Patient unfortunately has no idea who he previously followed with cardiology in [facility] but he does remember seeing a Dr. Ilsa Iha for primary care. Will see if our office can obtain records for Dr. Delton See to review next OV. With regard to pleuritic chest pain, this has resolved on colchicine. Continue for the next 3 months, and consider d/c upon follow-up with Dr. Delton See.  2. CAD in native artery - no recent anginal-type pain. Continue present regimen. 3. Mitral regurgitation - per review of chart it appears this will be followed clinically for now. 4. Paroxysmal atrial fib - not on anticoagulation as above. Maintaining NSR by exam. 5. Essential HTN - BP mildly elevated today but he states he has not received his AM medicines yet. Would recommend the facility follow this closely and call if running >135/85.  Of note the discharge summary recommended PCP f/u in 3-5 days with CBC and CMET at that time. The patient believes he had bloodwork a few days ago. Have indicated request for these records to be faxed to our office, and if not drawn, need to be obtained.  Disposition: F/u with Dr. Delton See in 3 months.   Medication Adjustments/Labs and Tests Ordered: Current medicines are reviewed at length with the patient today.  Concerns regarding medicines are outlined above. Medication changes, Labs and Tests ordered  today are summarized above and listed in the Patient Instructions accessible in Encounters.   Thomasene Mohair PA-C  08/12/2016 8:13 AM    [facility] [address] [place], [place], [place]  [address] Phone: [phone]; Fax: [phone]

## 2016-08-12 ENCOUNTER — Ambulatory Visit (INDEPENDENT_AMBULATORY_CARE_PROVIDER_SITE_OTHER): Payer: Medicare Other | Admitting: Physician Assistant

## 2016-08-12 ENCOUNTER — Telehealth: Payer: Self-pay | Admitting: Physician Assistant

## 2016-08-12 ENCOUNTER — Telehealth: Payer: Self-pay | Admitting: *Deleted

## 2016-08-12 ENCOUNTER — Encounter: Payer: Self-pay | Admitting: Physician Assistant

## 2016-08-12 ENCOUNTER — Encounter: Payer: Self-pay | Admitting: Internal Medicine

## 2016-08-12 ENCOUNTER — Non-Acute Institutional Stay (SKILLED_NURSING_FACILITY): Payer: Medicare Other | Admitting: Internal Medicine

## 2016-08-12 VITALS — BP 140/90 | HR 85

## 2016-08-12 DIAGNOSIS — I319 Disease of pericardium, unspecified: Secondary | ICD-10-CM | POA: Insufficient documentation

## 2016-08-12 DIAGNOSIS — I340 Nonrheumatic mitral (valve) insufficiency: Secondary | ICD-10-CM

## 2016-08-12 DIAGNOSIS — I2510 Atherosclerotic heart disease of native coronary artery without angina pectoris: Secondary | ICD-10-CM | POA: Diagnosis not present

## 2016-08-12 DIAGNOSIS — I10 Essential (primary) hypertension: Secondary | ICD-10-CM

## 2016-08-12 DIAGNOSIS — I480 Paroxysmal atrial fibrillation: Secondary | ICD-10-CM | POA: Diagnosis not present

## 2016-08-12 DIAGNOSIS — I5042 Chronic combined systolic (congestive) and diastolic (congestive) heart failure: Secondary | ICD-10-CM

## 2016-08-12 DIAGNOSIS — J90 Pleural effusion, not elsewhere classified: Secondary | ICD-10-CM | POA: Diagnosis not present

## 2016-08-12 DIAGNOSIS — I5043 Acute on chronic combined systolic (congestive) and diastolic (congestive) heart failure: Secondary | ICD-10-CM | POA: Diagnosis not present

## 2016-08-12 DIAGNOSIS — I309 Acute pericarditis, unspecified: Secondary | ICD-10-CM

## 2016-08-12 NOTE — Assessment & Plan Note (Addendum)
Weights daily with intervention as per parameters

## 2016-08-12 NOTE — Progress Notes (Signed)
Facility Location: [facility]  Room Number: 113A  Code Status: Full Code  This is a nursing facility follow up for Nursing Facility readmission within 30 days  Interim medical record and care since last [facility] visit was updated with review of diagnostic studies and change in clinical status since last visit were documented.  HPI: The patient was hospitalized 4/21-4/26/18 with a complex medical picture. He presented with pleuritic chest pain. D-dimer was elevated at 8 and his BNP was also elevated. He was found have bilateral pleural effusions, right greater than left. He also was documented to have a pericardial rub. The pleural effusions were in the context of chronic combined congestive heart failure with an ejection fraction of 35-40 percent. He also had diffuse hypokinesis worse in the inferior and infero lateral walls. Additionally moderate mitral regurgitation was present.  Significant is a history of cirrhosis. He was also found to have proteinuria and decreased albumin stores which likely contribute to the effusions. Thoracentesis was performed with removal of 750 mL of fluid, there was no malignancy documented. He was discharged on 2 g sodium diet with 2 L fluid restriction per day. He was to continue colchicine for 3 months for the possible pericarditis/pleuritis.. Significantly SNF pharmacy has documented potential drug drug interaction between atorvastatin and colchicine with increased risk of myopathy. He is on colchicine 0.6 mg twice a day with duration plan for 3 months as noted. He was discharged on Augmentin 875/125 twice a day for 10 days and doxycycline 50 mg 2 tablets twice a day for 10 days. As of 4/28 culture of the effusions revealed no growth.  Review of systems: He denies any active symptoms. His only request is for Ensure 3 times a day. Specifically he denies any cardiopulmonary or GI symptoms. He denies depression but the staff  feels that his affect is markedly flat and his personality has changed. They commented that he usually is somewhat garrulous, refusing lab draws but is now passive.  Constitutional: No fever,significant weight change, fatigue  Eyes: No redness, discharge, pain, vision change ENT/mouth: No nasal congestion,  purulent discharge, earache,change in hearing ,sore throat  Cardiovascular: No chest pain, palpitations,paroxysmal nocturnal dyspnea, claudication, edema  Respiratory: No cough, sputum production,hemoptysis, DOE , significant snoring,apnea  Gastrointestinal: No heartburn,dysphagia,abdominal pain, nausea / vomiting,rectal bleeding, melena,change in bowels Genitourinary: No dysuria,hematuria, pyuria,  incontinence, nocturia Musculoskeletal: No joint stiffness, joint swelling, weakness,pain Dermatologic: No rash, pruritus, change in appearance of skin Neurologic: No dizziness,headache,syncope, seizures, numbness , tingling Psychiatric: No significant anxiety , depression, insomnia, anorexia Endocrine: No change in hair/skin/ nails, excessive thirst, excessive hunger, excessive urination  Hematologic/lymphatic: No significant bruising, lymphadenopathy,abnormal bleeding Allergy/immunology: No itchy/ watery eyes, significant sneezing, urticaria, angioedema  Physical exam:  Pertinent or positive findings: Pattern alopecia is present. Proptosis suggested. He has a few remaining lower teeth. Chest is surprisingly clear. Has minor rales at the right lower lobe. S4 is noted. There is dullness to percussion over the right abdomen. AKA is present on the right. The left foot is dressed. He has interosseous wasting of the hands. Has hyperpigmented, irregular dry, shiny changes to the skin over the left shin. Pedal pulses are decreased on the left. Affect is flat.  General appearance:Adequately nourished; no acute distress , increased work of breathing is present.   Lymphatic: No lymphadenopathy about the  head, neck, axilla . Eyes: No conjunctival inflammation or lid edema is present. There is no scleral icterus. Ears:  External ear exam  shows no significant lesions or deformities.   Nose:  External nasal examination shows no deformity or inflammation. Nasal mucosa are pink and moist without lesions ,exudates Oral exam: lips and gums are healthy appearing.There is no oropharyngeal erythema or exudate . Neck:  No thyromegaly, masses, tenderness noted.    Heart:  Normal rate and regular rhythm without gallop, murmur, click, rub .  Abdomen:Bowel sounds are normal. Abdomen is soft and nontender GU: deferred  Extremities:  No cyanosis, clubbing,edema  Neurologic exam : Strength equal  in upper extremities Balance,Rhomberg,finger to nose testing could not be completed due to clinical state Skin: Warm & dry w/o tenting. No significant  rash.  See summary under each active problem in the Problem List with associated updated therapeutic plan

## 2016-08-12 NOTE — Telephone Encounter (Signed)
New Message   pt verbalized that she is calling for rn   She wants rn to go over the order and pt declines weekly order for weight and intake (pt family brings him in fast food like [employer])   Pt also declines blood draws and he refuses

## 2016-08-12 NOTE — Telephone Encounter (Signed)
Mardella Layman, LPN from [place] called to report that the pt is non-compliant and that all the recommendations that you made today at pts office visit, will not happen. She wanted to report that the pt has already been on daily weight checks, low sodium eating plan, but pt refuses. She stated that the pt will not weigh, he will not watch his fluid intake, and that he eats what he wants to. She stated that his family also contributes to this, as they bring him all kinds of fast food to eat all the time.  Just FYI!

## 2016-08-12 NOTE — Assessment & Plan Note (Addendum)
To date AFB, cytologies, and cultures nonrevealing or negative Check ANA, DC hydralazine

## 2016-08-12 NOTE — Patient Instructions (Signed)
See assessment and plan under each diagnosis in the problem list and acutely for this visit 

## 2016-08-12 NOTE — Assessment & Plan Note (Addendum)
While on colchicine, decrease statin to prevent risk of myopathy DC hydralazine to rule out drug-induced lupus picture

## 2016-08-12 NOTE — Telephone Encounter (Signed)
Made a couple of phone calls to a couple different offices, information from Care Everywhere, and neither practice had the pt in their system.  There is no DPR to contact anyone.

## 2016-08-12 NOTE — Telephone Encounter (Signed)
-----   Message from Laurann [place], [place] sent at 08/12/2016 10:49 AM EDT ----- Regarding: Records This might be a far fetch but this patient remotely saw cardiology in [place]. He has no idea who he saw but does say he saw a PCP in [facility] named Dr. Ilsa Iha possibly. Can you see if we can track down any records from them (or see if DPR-allowed contacts know who he saw for cardiology)? Would be helpful for nelson to have them when she f/u with the patient in several months. Thank you ma'am! Dayna

## 2016-08-12 NOTE — Patient Instructions (Addendum)
Medication Instructions:    Your physician recommends that you continue on your current medications as directed. Please refer to the Current Medication list given to you today.  Labwork:  None ordered  Testing/Procedures:  None ordered  Follow-Up:  Your physician recommends that you schedule a follow-up appointment in: 3 months with Dr. Delton See  - If you need a refill on your cardiac medications before your next appointment, please call your pharmacy.   Thank you for choosing [facility]!!    Any Other Special Instructions Will Be Listed Below (If Applicable).  - Please weight patient daily and call our office for any weight gain of 3 pounds overnight or 5 pounds over several days.  - Follow 2000 mg sodium diet, 2 liter fluid restriction daily  - Call our office if blood pressure running greater than 135/85  - Please fax copy of most recent blood work as recommended by the hospital discharge paperwork.  Attention to Ronie Spies, PA  (discharge summary had recommended primary care follow up 3-5 days with  CMP/CBC.  If this has not been done, please obtain)

## 2016-08-12 NOTE — Telephone Encounter (Signed)
This will certainly make his care very challenging. I do appreciate their efforts and this is good information to have going forward. All we can do is reinforce these recommendations which I emphasized at today's visit.  Dayna Dunn PA-C

## 2016-08-15 LAB — CBC AND DIFFERENTIAL
HCT: 29 % — AB (ref 41–53)
Hemoglobin: 8.6 g/dL — AB (ref 13.5–17.5)
PLATELETS: 241 10*3/uL (ref 150–399)
WBC: 6.3 10^3/mL

## 2016-08-15 LAB — BASIC METABOLIC PANEL
BUN: 28 mg/dL — AB (ref 4–21)
CREATININE: 1.1 mg/dL (ref 0.6–1.3)
GLUCOSE: 145 mg/dL
POTASSIUM: 5.2 mmol/L (ref 3.4–5.3)
Sodium: 133 mmol/L — AB (ref 137–147)

## 2016-08-15 LAB — HEPATIC FUNCTION PANEL
ALT: 36 U/L (ref 10–40)
AST: 55 U/L — AB (ref 14–40)
Alkaline Phosphatase: 265 U/L — AB (ref 25–125)
Bilirubin, Total: 0.6 mg/dL

## 2016-08-19 ENCOUNTER — Encounter: Payer: Self-pay | Admitting: Physician Assistant

## 2016-08-19 ENCOUNTER — Other Ambulatory Visit: Payer: Self-pay | Admitting: Internal Medicine

## 2016-08-19 DIAGNOSIS — J90 Pleural effusion, not elsewhere classified: Secondary | ICD-10-CM

## 2016-08-19 LAB — ANTINUCLEAR ANTIBODIES, IFA: ANTINUCLEAR ANTIBODIES, IFA: POSITIVE

## 2016-08-19 NOTE — Progress Notes (Signed)
See message below from Dr. Alwyn RenHopper.   [name], Ronald F, MD  [name], Ronald N, PA-C        Please see +NA ( Sjogren Syndrome A)  Hydralazine had been D/Ced  Rheumatology referral made to Dr Cheri GuppyBeekman     ANA, IFA (with reflex)  Order: [id number]  Status:  Final result Visible to patient:  No (Not Released)   5d ago  ANA Ab, IFA Positive   Comments: Sjogern Syndrome A (positive)

## 2016-08-21 ENCOUNTER — Non-Acute Institutional Stay (SKILLED_NURSING_FACILITY): Payer: Medicare Other | Admitting: Internal Medicine

## 2016-08-21 ENCOUNTER — Encounter: Payer: Self-pay | Admitting: Internal Medicine

## 2016-08-21 DIAGNOSIS — R768 Other specified abnormal immunological findings in serum: Secondary | ICD-10-CM | POA: Diagnosis not present

## 2016-08-21 DIAGNOSIS — I5043 Acute on chronic combined systolic (congestive) and diastolic (congestive) heart failure: Secondary | ICD-10-CM

## 2016-08-21 DIAGNOSIS — J418 Mixed simple and mucopurulent chronic bronchitis: Secondary | ICD-10-CM | POA: Diagnosis not present

## 2016-08-21 DIAGNOSIS — R7689 Other specified abnormal immunological findings in serum: Secondary | ICD-10-CM | POA: Insufficient documentation

## 2016-08-21 DIAGNOSIS — J42 Unspecified chronic bronchitis: Secondary | ICD-10-CM | POA: Insufficient documentation

## 2016-08-21 NOTE — Assessment & Plan Note (Signed)
08/21/16 edema in the left lower extremity is actually improving. Clinically his ascites appears improved. This would most likely be related to the protein supplementation. 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

## 2016-08-21 NOTE — Assessment & Plan Note (Signed)
DuoNeb 3 times a day 1 week Incentive spirometer Supplemental oxygen to maintain O2 sats greater than 90%

## 2016-08-21 NOTE — Assessment & Plan Note (Signed)
Rheumatology consultation

## 2016-08-21 NOTE — Patient Instructions (Signed)
See assessment and plan under each diagnosis in the problem list and acutely for this visit 

## 2016-08-21 NOTE — Progress Notes (Signed)
   [facility] Room: 113   This is a nursing facility follow up for specific acute issue of 4 pound weight gain with possible increasing abdominal distention with questionable rigidity.  Interim medical record and care since last [facility] visit was updated with review of diagnostic studies and change in clinical status since last visit were documented.  HPI: The patient states that his peripheral edema is actually improving. This is in the context of increased protein supplementation as Ensure or nutritional supplements. He does complain of exertional dyspnea. He has been noted to desaturate to 86% with exertion in PT/OT ambulating with his new prosthesis. He does describe a cough with a few teaspoons of brown sputum daily. By history he smoked at least 55 years up to 1.5 packs per day. His ANA was found to be positive with Sjogren's A pattern. This is in the context of hydralazine therapy which has been discontinued. The ANA was done as he presented with bilateral pleural effusions as well as possible pericardial effusion without definite etiology. He does deny dry eyes, dry mouth, dysphagia, joint pain or swelling.  Review of systems: He states he is having frequent bowel movements and is requesting discontinuation of stool softeners or laxatives.  Constitutional: No fever, fatigue  Eyes: No redness, discharge, pain, vision change ENT/mouth: No nasal congestion,  purulent discharge, earache,change in hearing ,sore throat  Cardiovascular: No chest pain, palpitations,paroxysmal nocturnal dyspnea, claudication Respiratory: No hemoptysis Gastrointestinal: No heartburn,abdominal pain, nausea / vomiting,rectal bleeding, melena,change in bowels Genitourinary: No dysuria,hematuria, pyuria,  incontinence, nocturia Dermatologic: No rash, pruritus, change in appearance of skin Neurologic: No dizziness,headache,syncope, seizures, numbness , tingling Psychiatric: No  significant anxiety , depression, insomnia, anorexia Endocrine: No change in hair/skin/ nails, excessive thirst, excessive hunger, excessive urination  Hematologic/lymphatic: No significant bruising, lymphadenopathy,abnormal bleeding Allergy/immunology: No itchy/ watery eyes, significant sneezing, urticaria, angioedema  Physical exam:  Pertinent or positive findings: Pattern alopecia is present. Proptosis is suggested. The maxilla is edentulous. He is not wearing his lower partial. Breath sounds are decreased, he has mild rhonchi in the lower lobes. Abdomen is protuberant but not rigid or tender. He has trace edema of the left lower extremity. He has scattered hyperpigmentation over the left shin. The left foot is dressed. He is wearing the prosthesis on the right lower extremity.  General appearance:Adequately nourished; no acute distress , increased work of breathing is present.   Lymphatic: No lymphadenopathy about the head, neck, axilla . Eyes: No conjunctival inflammation or lid edema is present. There is no scleral icterus. Ears:  External ear exam shows no significant lesions or deformities.   Nose:  External nasal examination shows no deformity or inflammation. Nasal mucosa are pink and moist without lesions ,exudates Oral exam: lips and gums are healthy appearing.There is no oropharyngeal erythema or exudate . Neck:  No thyromegaly, masses, tenderness noted.    Heart:  Normal rate and regular rhythm. S1 and S2 normal without gallop, murmur, click, rub .  Abdomen:Bowel sounds are normal; no organomegaly, hernias,masses. GU: deferred  Extremities:  No cyanosis, clubbing  Neurologic exam : Balance,Rhomberg,finger to nose testing could not be completed due to clinical state Skin: Warm & dry w/o tenting. No significant rash.  See summary under each active problem in the Problem List with associated updated therapeutic plan

## 2016-08-26 ENCOUNTER — Encounter: Payer: Self-pay | Admitting: Internal Medicine

## 2016-08-26 ENCOUNTER — Non-Acute Institutional Stay (SKILLED_NURSING_FACILITY): Payer: Medicare Other | Admitting: Internal Medicine

## 2016-08-26 ENCOUNTER — Inpatient Hospital Stay (HOSPITAL_COMMUNITY)
Admission: EM | Admit: 2016-08-26 | Discharge: 2016-09-06 | DRG: 871 | Disposition: A | Discharge status: Skilled Nursing Facility | Payer: Medicare Other | Attending: Family Medicine | Admitting: Family Medicine

## 2016-08-26 DIAGNOSIS — A419 Sepsis, unspecified organism: Secondary | ICD-10-CM | POA: Diagnosis not present

## 2016-08-26 DIAGNOSIS — E039 Hypothyroidism, unspecified: Secondary | ICD-10-CM | POA: Diagnosis present

## 2016-08-26 DIAGNOSIS — N390 Urinary tract infection, site not specified: Secondary | ICD-10-CM | POA: Diagnosis present

## 2016-08-26 DIAGNOSIS — R570 Cardiogenic shock: Secondary | ICD-10-CM | POA: Diagnosis not present

## 2016-08-26 DIAGNOSIS — Z89611 Acquired absence of right leg above knee: Secondary | ICD-10-CM

## 2016-08-26 DIAGNOSIS — E1151 Type 2 diabetes mellitus with diabetic peripheral angiopathy without gangrene: Secondary | ICD-10-CM | POA: Diagnosis present

## 2016-08-26 DIAGNOSIS — J9600 Acute respiratory failure, unspecified whether with hypoxia or hypercapnia: Secondary | ICD-10-CM

## 2016-08-26 DIAGNOSIS — F17210 Nicotine dependence, cigarettes, uncomplicated: Secondary | ICD-10-CM | POA: Diagnosis present

## 2016-08-26 DIAGNOSIS — J189 Pneumonia, unspecified organism: Secondary | ICD-10-CM | POA: Diagnosis present

## 2016-08-26 DIAGNOSIS — Z9889 Other specified postprocedural states: Secondary | ICD-10-CM

## 2016-08-26 DIAGNOSIS — I5023 Acute on chronic systolic (congestive) heart failure: Secondary | ICD-10-CM

## 2016-08-26 DIAGNOSIS — R918 Other nonspecific abnormal finding of lung field: Secondary | ICD-10-CM

## 2016-08-26 DIAGNOSIS — I480 Paroxysmal atrial fibrillation: Secondary | ICD-10-CM | POA: Diagnosis present

## 2016-08-26 DIAGNOSIS — K7030 Alcoholic cirrhosis of liver without ascites: Secondary | ICD-10-CM | POA: Diagnosis present

## 2016-08-26 DIAGNOSIS — E871 Hypo-osmolality and hyponatremia: Secondary | ICD-10-CM | POA: Diagnosis present

## 2016-08-26 DIAGNOSIS — Z4659 Encounter for fitting and adjustment of other gastrointestinal appliance and device: Secondary | ICD-10-CM

## 2016-08-26 DIAGNOSIS — Z8249 Family history of ischemic heart disease and other diseases of the circulatory system: Secondary | ICD-10-CM

## 2016-08-26 DIAGNOSIS — N485 Ulcer of penis: Secondary | ICD-10-CM | POA: Diagnosis present

## 2016-08-26 DIAGNOSIS — F329 Major depressive disorder, single episode, unspecified: Secondary | ICD-10-CM | POA: Diagnosis present

## 2016-08-26 DIAGNOSIS — I5022 Chronic systolic (congestive) heart failure: Secondary | ICD-10-CM

## 2016-08-26 DIAGNOSIS — Z794 Long term (current) use of insulin: Secondary | ICD-10-CM

## 2016-08-26 DIAGNOSIS — E1142 Type 2 diabetes mellitus with diabetic polyneuropathy: Secondary | ICD-10-CM | POA: Diagnosis present

## 2016-08-26 DIAGNOSIS — Z951 Presence of aortocoronary bypass graft: Secondary | ICD-10-CM

## 2016-08-26 DIAGNOSIS — J9691 Respiratory failure, unspecified with hypoxia: Secondary | ICD-10-CM | POA: Diagnosis present

## 2016-08-26 DIAGNOSIS — J9601 Acute respiratory failure with hypoxia: Secondary | ICD-10-CM | POA: Diagnosis present

## 2016-08-26 DIAGNOSIS — I255 Ischemic cardiomyopathy: Secondary | ICD-10-CM | POA: Diagnosis present

## 2016-08-26 DIAGNOSIS — E11649 Type 2 diabetes mellitus with hypoglycemia without coma: Secondary | ICD-10-CM | POA: Diagnosis present

## 2016-08-26 DIAGNOSIS — G9340 Encephalopathy, unspecified: Secondary | ICD-10-CM | POA: Diagnosis not present

## 2016-08-26 DIAGNOSIS — R0689 Other abnormalities of breathing: Secondary | ICD-10-CM

## 2016-08-26 DIAGNOSIS — R0600 Dyspnea, unspecified: Secondary | ICD-10-CM

## 2016-08-26 DIAGNOSIS — I2510 Atherosclerotic heart disease of native coronary artery without angina pectoris: Secondary | ICD-10-CM | POA: Diagnosis present

## 2016-08-26 DIAGNOSIS — Z8673 Personal history of transient ischemic attack (TIA), and cerebral infarction without residual deficits: Secondary | ICD-10-CM

## 2016-08-26 DIAGNOSIS — I509 Heart failure, unspecified: Secondary | ICD-10-CM

## 2016-08-26 DIAGNOSIS — R030 Elevated blood-pressure reading, without diagnosis of hypertension: Secondary | ICD-10-CM | POA: Diagnosis present

## 2016-08-26 DIAGNOSIS — Z952 Presence of prosthetic heart valve: Secondary | ICD-10-CM

## 2016-08-26 DIAGNOSIS — D631 Anemia in chronic kidney disease: Secondary | ICD-10-CM | POA: Diagnosis present

## 2016-08-26 DIAGNOSIS — E1122 Type 2 diabetes mellitus with diabetic chronic kidney disease: Secondary | ICD-10-CM | POA: Diagnosis present

## 2016-08-26 DIAGNOSIS — Z79899 Other long term (current) drug therapy: Secondary | ICD-10-CM

## 2016-08-26 DIAGNOSIS — Z7982 Long term (current) use of aspirin: Secondary | ICD-10-CM

## 2016-08-26 DIAGNOSIS — N183 Chronic kidney disease, stage 3 (moderate): Secondary | ICD-10-CM | POA: Diagnosis present

## 2016-08-26 DIAGNOSIS — Z7401 Bed confinement status: Secondary | ICD-10-CM

## 2016-08-26 DIAGNOSIS — J90 Pleural effusion, not elsewhere classified: Secondary | ICD-10-CM

## 2016-08-26 DIAGNOSIS — B182 Chronic viral hepatitis C: Secondary | ICD-10-CM | POA: Diagnosis present

## 2016-08-26 DIAGNOSIS — Z833 Family history of diabetes mellitus: Secondary | ICD-10-CM

## 2016-08-26 DIAGNOSIS — E162 Hypoglycemia, unspecified: Secondary | ICD-10-CM

## 2016-08-26 DIAGNOSIS — Z9981 Dependence on supplemental oxygen: Secondary | ICD-10-CM

## 2016-08-26 DIAGNOSIS — N179 Acute kidney failure, unspecified: Secondary | ICD-10-CM | POA: Diagnosis present

## 2016-08-26 DIAGNOSIS — R6521 Severe sepsis with septic shock: Secondary | ICD-10-CM | POA: Diagnosis present

## 2016-08-26 DIAGNOSIS — I5043 Acute on chronic combined systolic (congestive) and diastolic (congestive) heart failure: Secondary | ICD-10-CM | POA: Diagnosis present

## 2016-08-26 DIAGNOSIS — J9621 Acute and chronic respiratory failure with hypoxia: Secondary | ICD-10-CM

## 2016-08-26 DIAGNOSIS — R778 Other specified abnormalities of plasma proteins: Secondary | ICD-10-CM | POA: Diagnosis present

## 2016-08-26 LAB — CBG MONITORING, ED: Glucose-Capillary: 65 mg/dL (ref 65–99)

## 2016-08-26 NOTE — ED Triage Notes (Signed)
[facility] with reports of hypoglycemia and shortness of breath. On arrival Pt CBG in 40s, rechecked PTA at 5467. Pt give 5mg  Albuterol PTA.

## 2016-08-26 NOTE — Patient Instructions (Addendum)
See assessment and plan under each diagnosis acutely for this visit  

## 2016-08-26 NOTE — Progress Notes (Signed)
   This is a nursing facility follow up for specific acute issue of hypoglycemia.  Interim medical record and care since last [facility] visit was updated with review of diagnostic studies and change in clinical status since last visit were documented.  HPI: Staff reports on 5/14 blood sugar dropped to 43, patient received 2 glucagon shots, cup of juice with glucose rising to 63. Patient remained lethargic with poor oral intake. Today patient was described as being short of breath and as holding his abdomen. Staff reports bloody stools have recurred. Prior to the mental status changes the patient was noncompliant refusing blood draws. Additionally he previously had refused colonoscopy to evaluate the GI bleeding.  Review of systems: Could not be completed due to obtundation  Physical exam:  Pertinent or positive findings: Patient is markedly lethargic and responds only to noxious stimulus by opening his eyes but not interacting. Proptosis is present. He has coarse rhonchi in all lung fields obscuring the heart sounds. There is neck vein distention to the mandible Bowel sounds are decreased w/o ileus clinically. Left foot is dressed. No edema is noted in the left lower extremity. 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  General appearance:Adequately nourished; no acute distress , increased work of breathing is present.   Lymphatic: No lymphadenopathy about the head, neck, axilla . Neck:  No thyromegaly, masses, tenderness noted.    Abdomen: Abdomen is soft and nontender with no definite mass. GU: deferred  Extremities:  No cyanosis, clubbing  Skin: Warm & dry w/o tenting. No significant rash.  See summary under each active problem in the Problem List with associated updated therapeutic plan

## 2016-08-27 ENCOUNTER — Emergency Department (HOSPITAL_COMMUNITY): Payer: Medicare Other

## 2016-08-27 ENCOUNTER — Inpatient Hospital Stay (HOSPITAL_COMMUNITY): Payer: Medicare Other

## 2016-08-27 ENCOUNTER — Other Ambulatory Visit (HOSPITAL_COMMUNITY): Payer: Medicare Other

## 2016-08-27 DIAGNOSIS — I255 Ischemic cardiomyopathy: Secondary | ICD-10-CM | POA: Diagnosis present

## 2016-08-27 DIAGNOSIS — E039 Hypothyroidism, unspecified: Secondary | ICD-10-CM | POA: Diagnosis present

## 2016-08-27 DIAGNOSIS — I5023 Acute on chronic systolic (congestive) heart failure: Secondary | ICD-10-CM | POA: Diagnosis not present

## 2016-08-27 DIAGNOSIS — G9340 Encephalopathy, unspecified: Secondary | ICD-10-CM

## 2016-08-27 DIAGNOSIS — J9691 Respiratory failure, unspecified with hypoxia: Secondary | ICD-10-CM | POA: Diagnosis present

## 2016-08-27 DIAGNOSIS — A419 Sepsis, unspecified organism: Principal | ICD-10-CM

## 2016-08-27 DIAGNOSIS — E871 Hypo-osmolality and hyponatremia: Secondary | ICD-10-CM | POA: Diagnosis present

## 2016-08-27 DIAGNOSIS — I480 Paroxysmal atrial fibrillation: Secondary | ICD-10-CM | POA: Diagnosis present

## 2016-08-27 DIAGNOSIS — I2510 Atherosclerotic heart disease of native coronary artery without angina pectoris: Secondary | ICD-10-CM | POA: Diagnosis present

## 2016-08-27 DIAGNOSIS — J189 Pneumonia, unspecified organism: Secondary | ICD-10-CM | POA: Diagnosis present

## 2016-08-27 DIAGNOSIS — R570 Cardiogenic shock: Secondary | ICD-10-CM | POA: Diagnosis present

## 2016-08-27 DIAGNOSIS — F329 Major depressive disorder, single episode, unspecified: Secondary | ICD-10-CM | POA: Diagnosis present

## 2016-08-27 DIAGNOSIS — J9600 Acute respiratory failure, unspecified whether with hypoxia or hypercapnia: Secondary | ICD-10-CM

## 2016-08-27 DIAGNOSIS — R6521 Severe sepsis with septic shock: Secondary | ICD-10-CM

## 2016-08-27 DIAGNOSIS — J9601 Acute respiratory failure with hypoxia: Secondary | ICD-10-CM

## 2016-08-27 DIAGNOSIS — E162 Hypoglycemia, unspecified: Secondary | ICD-10-CM | POA: Diagnosis not present

## 2016-08-27 DIAGNOSIS — K7030 Alcoholic cirrhosis of liver without ascites: Secondary | ICD-10-CM | POA: Diagnosis present

## 2016-08-27 DIAGNOSIS — B182 Chronic viral hepatitis C: Secondary | ICD-10-CM | POA: Diagnosis present

## 2016-08-27 DIAGNOSIS — N179 Acute kidney failure, unspecified: Secondary | ICD-10-CM | POA: Diagnosis present

## 2016-08-27 DIAGNOSIS — J9621 Acute and chronic respiratory failure with hypoxia: Secondary | ICD-10-CM | POA: Diagnosis not present

## 2016-08-27 DIAGNOSIS — I5043 Acute on chronic combined systolic (congestive) and diastolic (congestive) heart failure: Secondary | ICD-10-CM | POA: Diagnosis present

## 2016-08-27 DIAGNOSIS — R778 Other specified abnormalities of plasma proteins: Secondary | ICD-10-CM | POA: Diagnosis present

## 2016-08-27 DIAGNOSIS — E1142 Type 2 diabetes mellitus with diabetic polyneuropathy: Secondary | ICD-10-CM | POA: Diagnosis present

## 2016-08-27 DIAGNOSIS — I5022 Chronic systolic (congestive) heart failure: Secondary | ICD-10-CM

## 2016-08-27 DIAGNOSIS — D631 Anemia in chronic kidney disease: Secondary | ICD-10-CM | POA: Diagnosis present

## 2016-08-27 DIAGNOSIS — J90 Pleural effusion, not elsewhere classified: Secondary | ICD-10-CM | POA: Diagnosis not present

## 2016-08-27 DIAGNOSIS — N390 Urinary tract infection, site not specified: Secondary | ICD-10-CM | POA: Diagnosis present

## 2016-08-27 DIAGNOSIS — E1122 Type 2 diabetes mellitus with diabetic chronic kidney disease: Secondary | ICD-10-CM | POA: Diagnosis present

## 2016-08-27 DIAGNOSIS — Z9889 Other specified postprocedural states: Secondary | ICD-10-CM | POA: Diagnosis not present

## 2016-08-27 DIAGNOSIS — E1151 Type 2 diabetes mellitus with diabetic peripheral angiopathy without gangrene: Secondary | ICD-10-CM | POA: Diagnosis present

## 2016-08-27 DIAGNOSIS — E11649 Type 2 diabetes mellitus with hypoglycemia without coma: Secondary | ICD-10-CM | POA: Diagnosis present

## 2016-08-27 DIAGNOSIS — F17210 Nicotine dependence, cigarettes, uncomplicated: Secondary | ICD-10-CM | POA: Diagnosis present

## 2016-08-27 LAB — I-STAT ARTERIAL BLOOD GAS, ED
ACID-BASE DEFICIT: 2 mmol/L (ref 0.0–2.0)
BICARBONATE: 23.4 mmol/L (ref 20.0–28.0)
O2 Saturation: 98 %
PH ART: 7.343 — AB (ref 7.350–7.450)
PO2 ART: 109 mmHg — AB (ref 83.0–108.0)
Patient temperature: 98.7
TCO2: 25 mmol/L (ref 0–100)
pCO2 arterial: 43.1 mmHg (ref 32.0–48.0)

## 2016-08-27 LAB — MAGNESIUM: MAGNESIUM: 1.9 mg/dL (ref 1.7–2.4)

## 2016-08-27 LAB — CBC
HEMATOCRIT: 30.8 % — AB (ref 39.0–52.0)
HEMOGLOBIN: 9.6 g/dL — AB (ref 13.0–17.0)
MCH: 26.7 pg (ref 26.0–34.0)
MCHC: 31.2 g/dL (ref 30.0–36.0)
MCV: 85.6 fL (ref 78.0–100.0)
PLATELETS: 273 10*3/uL (ref 150–400)
RBC: 3.6 MIL/uL — AB (ref 4.22–5.81)
RDW: 19.4 % — ABNORMAL HIGH (ref 11.5–15.5)
WBC: 10.9 10*3/uL — ABNORMAL HIGH (ref 4.0–10.5)

## 2016-08-27 LAB — URINALYSIS, ROUTINE W REFLEX MICROSCOPIC
Bilirubin Urine: NEGATIVE
GLUCOSE, UA: NEGATIVE mg/dL
Hgb urine dipstick: NEGATIVE
KETONES UR: NEGATIVE mg/dL
Nitrite: NEGATIVE
PROTEIN: 30 mg/dL — AB
Specific Gravity, Urine: 1.009 (ref 1.005–1.030)
pH: 5 (ref 5.0–8.0)

## 2016-08-27 LAB — BASIC METABOLIC PANEL
Anion gap: 8 (ref 5–15)
BUN: 40 mg/dL — AB (ref 6–20)
CHLORIDE: 97 mmol/L — AB (ref 101–111)
CO2: 23 mmol/L (ref 22–32)
Calcium: 8 mg/dL — ABNORMAL LOW (ref 8.9–10.3)
Creatinine, Ser: 1.4 mg/dL — ABNORMAL HIGH (ref 0.61–1.24)
GFR calc Af Amer: 58 mL/min — ABNORMAL LOW (ref >60)
GFR calc non Af Amer: 50 mL/min — ABNORMAL LOW (ref >60)
GLUCOSE: 80 mg/dL (ref 65–99)
POTASSIUM: 3.8 mmol/L (ref 3.5–5.1)
Sodium: 128 mmol/L — ABNORMAL LOW (ref 135–145)

## 2016-08-27 LAB — COOXEMETRY PANEL
CARBOXYHEMOGLOBIN: 1.7 % — AB (ref 0.5–1.5)
METHEMOGLOBIN: 0.8 % (ref 0.0–1.5)
O2 SAT: 69.8 %
Total hemoglobin: 10 g/dL — ABNORMAL LOW (ref 12.0–16.0)

## 2016-08-27 LAB — BLOOD GAS, ARTERIAL
Acid-base deficit: 1.8 mmol/L (ref 0.0–2.0)
Bicarbonate: 22.1 mmol/L (ref 20.0–28.0)
DRAWN BY: 418751
FIO2: 60
MECHVT: 540 mL
O2 SAT: 98.4 %
PATIENT TEMPERATURE: 98.6
PEEP/CPAP: 5 cmH2O
PH ART: 7.414 (ref 7.350–7.450)
RATE: 16 resp/min
pCO2 arterial: 35.2 mmHg (ref 32.0–48.0)
pO2, Arterial: 117 mmHg — ABNORMAL HIGH (ref 83.0–108.0)

## 2016-08-27 LAB — GLUCOSE, CAPILLARY
GLUCOSE-CAPILLARY: 85 mg/dL (ref 65–99)
GLUCOSE-CAPILLARY: 115 mg/dL — AB (ref 65–99)
Glucose-Capillary: 109 mg/dL — ABNORMAL HIGH (ref 65–99)
Glucose-Capillary: 74 mg/dL (ref 65–99)
Glucose-Capillary: 102 mg/dL — ABNORMAL HIGH (ref 65–99)

## 2016-08-27 LAB — COMPREHENSIVE METABOLIC PANEL
ALBUMIN: 2.4 g/dL — AB (ref 3.5–5.0)
ALT: 16 U/L — ABNORMAL LOW (ref 17–63)
ANION GAP: 8 (ref 5–15)
AST: 28 U/L (ref 15–41)
Alkaline Phosphatase: 141 U/L — ABNORMAL HIGH (ref 38–126)
BUN: 41 mg/dL — AB (ref 6–20)
CALCIUM: 8.3 mg/dL — AB (ref 8.9–10.3)
CHLORIDE: 95 mmol/L — AB (ref 101–111)
CO2: 25 mmol/L (ref 22–32)
Creatinine, Ser: 1.43 mg/dL — ABNORMAL HIGH (ref 0.61–1.24)
GFR calc Af Amer: 57 mL/min — ABNORMAL LOW (ref >60)
GFR calc non Af Amer: 49 mL/min — ABNORMAL LOW (ref >60)
Glucose, Bld: 61 mg/dL — ABNORMAL LOW (ref 65–99)
POTASSIUM: 3.8 mmol/L (ref 3.5–5.1)
SODIUM: 128 mmol/L — AB (ref 135–145)
TOTAL PROTEIN: 7.1 g/dL (ref 6.5–8.1)
Total Bilirubin: 1 mg/dL (ref 0.3–1.2)

## 2016-08-27 LAB — PROTIME-INR
INR: 1.57
PROTHROMBIN TIME: 18.9 s — AB (ref 11.4–15.2)

## 2016-08-27 LAB — CORTISOL: CORTISOL PLASMA: 29.8 ug/dL

## 2016-08-27 LAB — I-STAT CG4 LACTIC ACID, ED: Lactic Acid, Venous: 0.99 mmol/L (ref 0.5–1.9)

## 2016-08-27 LAB — RAPID URINE DRUG SCREEN, HOSP PERFORMED
AMPHETAMINES: NOT DETECTED
BARBITURATES: NOT DETECTED
Benzodiazepines: POSITIVE — AB
Cocaine: NOT DETECTED
Opiates: NOT DETECTED
Tetrahydrocannabinol: NOT DETECTED

## 2016-08-27 LAB — CBC WITH DIFFERENTIAL/PLATELET
BASOS PCT: 0 %
Basophils Absolute: 0 10*3/uL (ref 0.0–0.1)
EOS ABS: 0 10*3/uL (ref 0.0–0.7)
Eosinophils Relative: 0 %
HEMATOCRIT: 31.3 % — AB (ref 39.0–52.0)
HEMOGLOBIN: 9.7 g/dL — AB (ref 13.0–17.0)
Lymphocytes Relative: 4 %
Lymphs Abs: 0.4 10*3/uL — ABNORMAL LOW (ref 0.7–4.0)
MCH: 26.4 pg (ref 26.0–34.0)
MCHC: 31 g/dL (ref 30.0–36.0)
MCV: 85.3 fL (ref 78.0–100.0)
Monocytes Absolute: 1 10*3/uL (ref 0.1–1.0)
Monocytes Relative: 11 %
NEUTROS ABS: 8.2 10*3/uL — AB (ref 1.7–7.7)
NEUTROS PCT: 85 %
Platelets: 268 10*3/uL (ref 150–400)
RBC: 3.67 MIL/uL — AB (ref 4.22–5.81)
RDW: 18.9 % — ABNORMAL HIGH (ref 11.5–15.5)
WBC: 9.6 10*3/uL (ref 4.0–10.5)

## 2016-08-27 LAB — OSMOLALITY: Osmolality: 284 mOsm/kg (ref 275–295)

## 2016-08-27 LAB — TROPONIN I
TROPONIN I: 0.03 ng/mL — AB (ref <0.03)
Troponin I: 0.07 ng/mL (ref <0.03)
Troponin I: 0.06 ng/mL (ref <0.03)

## 2016-08-27 LAB — BRAIN NATRIURETIC PEPTIDE: B Natriuretic Peptide: 1093.5 pg/mL — ABNORMAL HIGH (ref 0.0–100.0)

## 2016-08-27 LAB — CBG MONITORING, ED
GLUCOSE-CAPILLARY: 106 mg/dL — AB (ref 65–99)
GLUCOSE-CAPILLARY: 207 mg/dL — AB (ref 65–99)

## 2016-08-27 LAB — MRSA PCR SCREENING: MRSA BY PCR: POSITIVE — AB

## 2016-08-27 LAB — PHOSPHORUS: PHOSPHORUS: 3.3 mg/dL (ref 2.5–4.6)

## 2016-08-27 LAB — PROCALCITONIN: PROCALCITONIN: 11.06 ng/mL

## 2016-08-27 MED ORDER — LEVOTHYROXINE SODIUM 100 MCG IV SOLR
12.5000 ug | Freq: Every day | INTRAVENOUS | Status: DC
Start: 2016-08-27 — End: 2016-08-29
  Administered 2016-08-27 – 2016-08-29 (×3): 12.5 ug via INTRAVENOUS
  Filled 2016-08-27 (×3): qty 5

## 2016-08-27 MED ORDER — IPRATROPIUM-ALBUTEROL 0.5-2.5 (3) MG/3ML IN SOLN
3.0000 mL | Freq: Four times a day (QID) | RESPIRATORY_TRACT | Status: DC
  Administered 2016-08-27 – 2016-08-30 (×13): 3 mL via RESPIRATORY_TRACT
  Filled 2016-08-27 (×14): qty 3

## 2016-08-27 MED ORDER — DEXTROSE 5 % IV SOLN
2.0000 g | Freq: Once | INTRAVENOUS | Status: AC
  Administered 2016-08-27: 2 g via INTRAVENOUS
  Filled 2016-08-27: qty 2

## 2016-08-27 MED ORDER — VANCOMYCIN HCL IN DEXTROSE 750-5 MG/150ML-% IV SOLN
750.0000 mg | Freq: Once | INTRAVENOUS | Status: AC
  Administered 2016-08-27: 750 mg via INTRAVENOUS
  Filled 2016-08-27: qty 150

## 2016-08-27 MED ORDER — PANTOPRAZOLE SODIUM 40 MG IV SOLR
40.0000 mg | Freq: Every day | INTRAVENOUS | Status: DC
  Administered 2016-08-27 – 2016-08-28 (×2): 40 mg via INTRAVENOUS
  Filled 2016-08-27 (×3): qty 40

## 2016-08-27 MED ORDER — MIDAZOLAM HCL 2 MG/2ML IJ SOLN
INTRAMUSCULAR | Status: AC
  Administered 2016-08-27: 1 mg
  Filled 2016-08-27: qty 2

## 2016-08-27 MED ORDER — DEXTROSE 50 % IV SOLN
1.0000 | Freq: Once | INTRAVENOUS | Status: AC
  Administered 2016-08-27: 50 mL via INTRAVENOUS
  Filled 2016-08-27: qty 50

## 2016-08-27 MED ORDER — FUROSEMIDE 10 MG/ML IJ SOLN
40.0000 mg | Freq: Two times a day (BID) | INTRAMUSCULAR | Status: DC
  Administered 2016-08-27 – 2016-08-28 (×3): 40 mg via INTRAVENOUS
  Filled 2016-08-27 (×3): qty 4

## 2016-08-27 MED ORDER — SODIUM CHLORIDE 0.9 % IV SOLN
250.0000 mL | INTRAVENOUS | Status: DC | PRN
  Administered 2016-09-01 – 2016-09-04 (×2): 250 mL via INTRAVENOUS

## 2016-08-27 MED ORDER — HEPARIN SODIUM (PORCINE) 5000 UNIT/ML IJ SOLN
5000.0000 [IU] | Freq: Three times a day (TID) | INTRAMUSCULAR | Status: DC
  Administered 2016-08-27 – 2016-09-06 (×27): 5000 [IU] via SUBCUTANEOUS
  Filled 2016-08-27 (×27): qty 1

## 2016-08-27 MED ORDER — ORAL CARE MOUTH RINSE
15.0000 mL | Freq: Four times a day (QID) | OROMUCOSAL | Status: DC
  Administered 2016-08-27 – 2016-08-29 (×9): 15 mL via OROMUCOSAL

## 2016-08-27 MED ORDER — PHENYLEPHRINE HCL 10 MG/ML IJ SOLN
INTRAMUSCULAR | Status: DC | PRN
  Administered 2016-08-27 (×2): 80 ug via INTRAVENOUS

## 2016-08-27 MED ORDER — POTASSIUM CHLORIDE 20 MEQ/15ML (10%) PO SOLN
40.0000 meq | Freq: Once | ORAL | Status: AC
  Administered 2016-08-27: 40 meq
  Filled 2016-08-27: qty 30

## 2016-08-27 MED ORDER — FENTANYL CITRATE (PF) 100 MCG/2ML IJ SOLN
50.0000 ug | INTRAMUSCULAR | Status: DC | PRN
  Administered 2016-08-27 – 2016-09-01 (×17): 50 ug via INTRAVENOUS
  Filled 2016-08-27 (×18): qty 2

## 2016-08-27 MED ORDER — VANCOMYCIN HCL IN DEXTROSE 1-5 GM/200ML-% IV SOLN
1000.0000 mg | Freq: Once | INTRAVENOUS | Status: AC
  Administered 2016-08-27: 1000 mg via INTRAVENOUS
  Filled 2016-08-27: qty 200

## 2016-08-27 MED ORDER — MUPIROCIN 2 % EX OINT
TOPICAL_OINTMENT | Freq: Two times a day (BID) | CUTANEOUS | Status: DC
  Administered 2016-08-27 – 2016-08-28 (×3): via NASAL
  Administered 2016-08-28: 1 via NASAL
  Administered 2016-08-29 – 2016-09-01 (×7): via NASAL
  Administered 2016-09-01: 1 via NASAL
  Administered 2016-09-02 (×2): via NASAL
  Administered 2016-09-03: 1 via NASAL
  Administered 2016-09-03 – 2016-09-05 (×4): via NASAL
  Administered 2016-09-05: 1 via NASAL
  Administered 2016-09-06: 10:00:00 via NASAL
  Filled 2016-08-27 (×4): qty 22

## 2016-08-27 MED ORDER — CHLORHEXIDINE GLUCONATE CLOTH 2 % EX PADS
6.0000 | MEDICATED_PAD | Freq: Once | CUTANEOUS | Status: AC
  Administered 2016-08-27: 6 via TOPICAL

## 2016-08-27 MED ORDER — HYDROCORTISONE NA SUCCINATE PF 100 MG IJ SOLR
50.0000 mg | Freq: Four times a day (QID) | INTRAMUSCULAR | Status: DC
  Administered 2016-08-27 – 2016-08-28 (×5): 50 mg via INTRAVENOUS
  Filled 2016-08-27 (×5): qty 2

## 2016-08-27 MED ORDER — PIPERACILLIN-TAZOBACTAM 3.375 G IVPB
3.3750 g | Freq: Three times a day (TID) | INTRAVENOUS | Status: DC
  Administered 2016-08-27 – 2016-08-31 (×12): 3.375 g via INTRAVENOUS
  Filled 2016-08-27 (×13): qty 50

## 2016-08-27 MED ORDER — FUROSEMIDE 10 MG/ML IJ SOLN
40.0000 mg | Freq: Once | INTRAMUSCULAR | Status: AC
  Administered 2016-08-27: 40 mg via INTRAVENOUS
  Filled 2016-08-27: qty 4

## 2016-08-27 MED ORDER — SODIUM CHLORIDE 0.9 % IV SOLN
1750.0000 mg | Freq: Once | INTRAVENOUS | Status: DC
  Filled 2016-08-27: qty 1750

## 2016-08-27 MED ORDER — CHLORHEXIDINE GLUCONATE 0.12% ORAL RINSE (MEDLINE KIT)
15.0000 mL | Freq: Two times a day (BID) | OROMUCOSAL | Status: DC
  Administered 2016-08-27 – 2016-09-05 (×15): 15 mL via OROMUCOSAL

## 2016-08-27 MED ORDER — FENTANYL CITRATE (PF) 100 MCG/2ML IJ SOLN
50.0000 ug | INTRAMUSCULAR | Status: AC | PRN
  Administered 2016-08-27 (×3): 50 ug via INTRAVENOUS
  Filled 2016-08-27 (×2): qty 2

## 2016-08-27 MED ORDER — ATORVASTATIN CALCIUM 20 MG PO TABS
20.0000 mg | ORAL_TABLET | Freq: Every day | ORAL | Status: DC
  Administered 2016-08-27 – 2016-09-05 (×10): 20 mg
  Filled 2016-08-27 (×11): qty 1

## 2016-08-27 MED ORDER — NOREPINEPHRINE BITARTRATE 1 MG/ML IV SOLN
0.0000 ug/min | INTRAVENOUS | Status: DC
  Administered 2016-08-27 (×2): 2 ug/min via INTRAVENOUS
  Filled 2016-08-27: qty 4

## 2016-08-27 MED ORDER — INSULIN ASPART 100 UNIT/ML ~~LOC~~ SOLN
0.0000 [IU] | SUBCUTANEOUS | Status: DC
  Administered 2016-08-28 (×2): 2 [IU] via SUBCUTANEOUS
  Administered 2016-08-28: 3 [IU] via SUBCUTANEOUS
  Administered 2016-08-28 (×2): 2 [IU] via SUBCUTANEOUS
  Administered 2016-08-28 – 2016-08-29 (×3): 3 [IU] via SUBCUTANEOUS
  Administered 2016-08-29: 2 [IU] via SUBCUTANEOUS
  Administered 2016-08-29: 3 [IU] via SUBCUTANEOUS
  Administered 2016-08-29 – 2016-08-30 (×4): 2 [IU] via SUBCUTANEOUS
  Administered 2016-08-30: 3 [IU] via SUBCUTANEOUS

## 2016-08-27 MED ORDER — ASPIRIN 81 MG PO CHEW
81.0000 mg | CHEWABLE_TABLET | Freq: Every day | ORAL | Status: DC
  Administered 2016-08-27 – 2016-09-06 (×11): 81 mg
  Filled 2016-08-27 (×11): qty 1

## 2016-08-27 MED ORDER — VANCOMYCIN HCL IN DEXTROSE 750-5 MG/150ML-% IV SOLN
750.0000 mg | Freq: Two times a day (BID) | INTRAVENOUS | Status: DC
  Administered 2016-08-27 – 2016-08-28 (×3): 750 mg via INTRAVENOUS
  Filled 2016-08-27 (×4): qty 150

## 2016-08-27 MED ORDER — ETOMIDATE 2 MG/ML IV SOLN
INTRAVENOUS | Status: DC | PRN
  Administered 2016-08-27: 20 mg via INTRAVENOUS

## 2016-08-27 MED ORDER — SUCCINYLCHOLINE CHLORIDE 20 MG/ML IJ SOLN
INTRAMUSCULAR | Status: DC | PRN
  Administered 2016-08-27: 150 mg via INTRAVENOUS

## 2016-08-27 MED ORDER — MIDAZOLAM HCL 2 MG/2ML IJ SOLN
1.0000 mg | INTRAMUSCULAR | Status: DC | PRN
  Administered 2016-08-27 – 2016-08-28 (×6): 1 mg via INTRAVENOUS
  Filled 2016-08-27 (×6): qty 2

## 2016-08-27 MED ORDER — SODIUM CHLORIDE 0.9 % IV BOLUS (SEPSIS)
2500.0000 mL | Freq: Once | INTRAVENOUS | Status: AC
  Administered 2016-08-27: 2500 mL via INTRAVENOUS

## 2016-08-27 MED ORDER — MAGNESIUM SULFATE 2 GM/50ML IV SOLN
2.0000 g | Freq: Once | INTRAVENOUS | Status: AC
  Administered 2016-08-27: 2 g via INTRAVENOUS
  Filled 2016-08-27: qty 50

## 2016-08-27 MED ORDER — PHENYLEPHRINE 40 MCG/ML (10ML) SYRINGE FOR IV PUSH (FOR BLOOD PRESSURE SUPPORT)
PREFILLED_SYRINGE | INTRAVENOUS | Status: AC
  Filled 2016-08-27: qty 10

## 2016-08-27 MED ORDER — MIDAZOLAM HCL 2 MG/2ML IJ SOLN
1.0000 mg | INTRAMUSCULAR | Status: DC | PRN
  Administered 2016-08-27 (×2): 1 mg via INTRAVENOUS
  Filled 2016-08-27 (×3): qty 2

## 2016-08-27 MED ORDER — FENTANYL CITRATE (PF) 100 MCG/2ML IJ SOLN
INTRAMUSCULAR | Status: AC
  Administered 2016-08-27: 100 ug
  Filled 2016-08-27: qty 2

## 2016-08-27 NOTE — ED Provider Notes (Signed)
  Physical Exam  BP (!) 75/61   Pulse 79   Temp 98.7 F (37.1 C) (Rectal)   Resp (!) 22   Ht 5\' 8"  (1.727 m)   Wt 77.6 kg   SpO2 95%   BMI 26.00 kg/m   Physical Exam  ED Course  Procedure Name: Intubation Date/Time: 08/27/2016 1:44 AM Performed by: Roxy HorsemanBROWNING, Wise Fees Pre-anesthesia Checklist: Patient identified, Patient being monitored, Timeout performed, Emergency Drugs available and Suction available Oxygen Delivery Method: Ambu bag Preoxygenation: Pre-oxygenation with 100% oxygen Intubation Type: IV induction Ventilation: Mask ventilation without difficulty Laryngoscope Size: Glidescope and 4 Grade View: Grade I Tube type: Subglottic suction tube Tube size: 7.5 mm Number of attempts: 1 Placement Confirmation: ETT inserted through vocal cords under direct vision Secured at: 22 cm Tube secured with: ETT holder Dental Injury: Teeth and Oropharynx as per pre-operative assessment              Roxy HorsemanBrowning, Dondi Aime, PA-C 08/27/16 0146    Shon BatonHorton, Courtney F, MD 08/27/16 [phone]

## 2016-08-27 NOTE — Care Management Note (Signed)
Case Management Note Donn PieriniKristi Devean Skoczylas RN, BSN Unit 2W-Case Manager-- 2H coverage [phone]  Patient Details  Name: [name] Hamill MRN: [id number] Date of Birth: [birth year]  Subjective/Objective:  Pt admitted with acute encephalopathy/pulmonary edema- intubated in the ED- on Vent.                  Action/Plan: PTA [facility] was at [facility]- CSW consulted-   Expected Discharge Date:                  Expected Discharge Plan:  Skilled Nursing Facility  In-House Referral:  Clinical Social Work  Discharge planning Services  CM Consult  Post Acute Care Choice:    Choice offered to:     DME Arranged:    DME Agency:     HH Arranged:    HH Agency:     Status of Service:  In process, will continue to follow  If discussed at Long Length of Stay Meetings, dates discussed:    Discharge Disposition:   Additional Comments:  Darrold SpanWebster, Webb Weed Hall, RN 08/27/2016, 2:53 PM

## 2016-08-27 NOTE — Progress Notes (Signed)
RT note-ETT confirmed by xray and secured at 26cm.

## 2016-08-27 NOTE — Consult Note (Signed)
Advanced Heart Failure Team Consult Note   Primary Physician: Dr. Unice Cobble  Primary Cardiologist:  Dr. Meda Coffee   Reason for Consultation: acute on chronic combined systolic and diastolic CHF.   HPI:    Ronald Simpson is seen today for evaluation of acute on chronic combined systolic and diastolic CHF at the request of Dr. Radford Pax.  Ronald Simpson is a 71 year old male with a past medical history of ICM, chronic combined systolic and diastolic CHF (EF 76-72%), CAD with remote CABG, mitral regurgitation s/p prior annular ring prothesis, CVA, DM, Hep C/ETOH cirrhosis, PVD, right LE amputation, PAF (not on anticoagulation likely due to h/o IC hemorrhage, prior GIB, cirrhosis).   Admitted 08/02/16-08/07/16 with pleuritic chest pain, d-dimer was elevated at 8, found to have bilateral pleural effusions and pericardial rub. CT also showed abnormality in the LAA which was felt due to prior clipping of the appendage at time of CABG, no further workup recommended. Symptoms significantly improved with thoracentesis 08/04/16 for his effusions as well as colchicine for pleuritic pain. Path neg for malignancy. Echo that admission showed an EF of 35-40%, diffuse HK worse in inferior and inferolateral walls, diastolic and systolic flattening of ventricular septum, moderate mitral regurgitation. He was discharged to [facility] rehab. Discharge weight was 167 pounds.   Admitted on 08/26/16 with decreased LOC and hypoglycemia (CBG 30). Developed respiratory distress and was intubated. Chest x ray showed bilateral pleural effusions, pulmonary edema. He was hypotensive, so CVL was placed and Levophed started. Pertinent admission labs - BNP 1093, creatinine 1.43, lactic acid 0.99, procalcitonin 11.06. + UTI. Admission weight 171 pounds.       Review of Systems: [y] = yes, [ ] = no - Obtained from chart as patient is intubated/sedated.   General: Weight gain [ y]; Weight loss [ ]; Anorexia [ ]; Fatigue [ ]; Fever [  ]; Chills [ ]; Weakness [ ]  Cardiac: Chest pain/pressure [ ]; Resting SOB [ ]; Exertional SOB [ ]; Orthopnea [ ]; Pedal Edema [ y]; Palpitations [ ]; Syncope [ ]; Presyncope [ ]; Paroxysmal nocturnal dyspnea[ ]  Pulmonary: Cough [ ]; Wheezing[ ]; Hemoptysis[ ]; Sputum Blue.Reese ]; Snoring [ ]  GI: Vomiting[ ]; Dysphagia[ ]; Melena[ ]; Hematochezia [ ]; Heartburn[ ]; Abdominal pain [ ]; Constipation [ ]; Diarrhea [ ]; BRBPR [ ]  GU: Hematuria[ ]; Dysuria [ ]; Nocturia[ ]  Vascular: Pain in legs with walking [ ]; Pain in feet with lying flat [ ]; Non-healing sores [ ]; Stroke [ ]; TIA [ ]; Slurred speech [ ];  Neuro: Headaches[ ]; Vertigo[ ]; Seizures[ ]; Paresthesias[ ];Blurred vision [ ]; Diplopia [ ]; Vision changes [ ]  Ortho/Skin: Arthritis [ ]; Joint pain [ ]; Muscle pain [ ]; Joint swelling [ ]; Back Pain [ ]; Rash [ ]  Psych: Depression[ ]; Anxiety[ ]  Heme: Bleeding problems [ ]; Clotting disorders [ ]; Anemia [ ]  Endocrine: Diabetes Blue.Reese ]; Thyroid dysfunction[ ]  Home Medications Prior to Admission medications   Medication Sig Start Date End Date Taking? Authorizing Provider  Amino Acids-Protein Hydrolys (FEEDING SUPPLEMENT, PRO-STAT SUGAR FREE 64,) LIQD Take 30 mLs by mouth 2 (two) times daily.    [provider]  aspirin EC 81 MG EC tablet Take 1 tablet (81 mg total) by mouth daily. 08/08/16   Reyne Dumas, MD  atorvastatin (LIPITOR) 20 MG tablet Take 20 mg by mouth daily.    [provider]  barrier cream (NON-SPECIFIED) CREA Apply 1 application topically 2 (two) times daily.    [provider]  carvedilol (COREG) 6.25 MG tablet Take 6.25 mg by mouth 2 (two) times daily with a meal.    [provider]  colchicine 0.6 MG tablet Take 1 tablet (0.6 mg total) by mouth 2 (two) times daily. 08/07/16 09/06/16  Reyne Dumas, MD  docusate sodium (COLACE) 100 MG capsule Take 100 mg by mouth 2 (two) times daily.    [provider]  DULoxetine (CYMBALTA) 30  MG capsule Take 30 mg by mouth daily.    [provider]  feeding supplement, ENSURE ENLIVE, (ENSURE ENLIVE) LIQD Take 237 mLs by mouth 2 (two) times daily between meals. 08/07/16   Reyne Dumas, MD  ferrous sulfate 325 (65 FE) MG tablet Take 325 mg by mouth 2 (two) times daily with a meal.    [provider]  furosemide (LASIX) 40 MG tablet Take 40 mg by mouth daily.     [provider]  insulin glargine (LANTUS) 100 unit/mL SOPN Inject 17 Units into the skin at bedtime.    [provider]  ipratropium-albuterol (DUONEB) 0.5-2.5 (3) MG/3ML SOLN Take 3 mLs by nebulization every 6 (six) hours as needed (shortness of breath).     [provider]  isosorbide mononitrate (IMDUR) 30 MG 24 hr tablet Take 1 tablet (30 mg total) by mouth daily. 01/14/16   Kelvin Cellar, MD  levothyroxine (SYNTHROID, LEVOTHROID) 25 MCG tablet Take 25 mcg by mouth daily before breakfast.    [provider]  nitroGLYCERIN (NITROSTAT) 0.4 MG SL tablet Place 0.4 mg under the tongue every 5 (five) minutes as needed for chest pain.    [provider]  OXYGEN Inhale into the lungs. 2lpm via [place] prn for shortness of breath or O2 sat <90%    [provider]  pantoprazole (PROTONIX) 40 MG tablet Take 1 tablet (40 mg total) by mouth daily. 05/22/16   Eugenie Filler, MD  polyethylene glycol [facility] / Floria Raveling) packet Take 17 g by mouth daily.    [provider]  sertraline (ZOLOFT) 25 MG tablet Take 25 mg by mouth daily.    [provider]    Past Medical History: Past Medical History:  Diagnosis Date  . CAD (coronary artery disease)    a. remote CABG.  . Chronic anemia   . Chronic combined systolic and diastolic CHF (congestive heart failure) ([place])   . Cirrhosis ([place])   . CKD (chronic kidney disease), stage III   . CVA (cerebral infarction)   . DM (diabetes mellitus), type 2 with peripheral vascular complications ([place])   . History of  hepatitis C 1990s   Hep B immune  . Hypoalbuminemia   . IDDM (insulin dependent diabetes mellitus) ([place])   . Mitral regurgitation    a. s/p prior ring annuloplasty prosthesis per echo with mod MR by echo 07/2016.  Marland Kitchen PAF (paroxysmal atrial fibrillation) ([facility])    a. not on anticoagulation likely due to h/o IC hemorrhage, prior GIB, cirrhosis. b. suspected prior LAA clipping based on imaging.  . Peripheral neuropathy   . Peripheral vascular disease ([place])   . Proteinuria     Past Surgical History: Past Surgical History:  Procedure Laterality Date  . AMPUTATION Right 01/07/2016   Procedure: AMPUTATION ABOVE KNEE;  Surgeon: Rosetta Posner, MD;  Location: [place];  Service: Vascular;  Laterality: Right;  . AMPUTATION Left 04/04/2016   Procedure: LEFT FOURTH  AND FIFTH  TOE AMPUTATION;  Surgeon: Rosetta Posner, MD;  Location: [place];  Service: Vascular;  Laterality: Left;  . BELOW KNEE LEG AMPUTATION Right   . CORONARY ARTERY BYPASS GRAFT  2005  . ESOPHAGOGASTRODUODENOSCOPY N/A 05/17/2016   Procedure: ESOPHAGOGASTRODUODENOSCOPY (EGD);  Surgeon: Ladene Artist, MD;  Location[facility] ENDOSCOPY;  Service: Endoscopy;  Laterality: N/A;  . IR GENERIC HISTORICAL  07/07/2016   IR PARACENTESIS 07/07/2016 Ardis Rowan, PA-C [facility]-INTERV RAD  . IR THORACENTESIS ASP PLEURAL SPACE W/IMG GUIDE  08/04/2016  . PERIPHERAL VASCULAR CATHETERIZATION N/A 01/11/2016   Procedure: Lower Extremity Angiography;  Surgeon: Serafina Mitchell, MD;  Location: [place] CV LAB;  Service: Cardiovascular;  Laterality: N/A;  . STERNOTOMY      Family History: Family History  Problem Relation Age of Onset  . Hypertension Other   . Diabetes Other   . Mental illness Other   . Lupus Other   . Diabetes Father   . Cancer Neg Hx   . Heart disease Neg Hx   . Stroke Neg Hx     Social History: Social History   Social History  . Marital status: Widowed    Spouse name: N/A  . Number of children: N/A  . Years of education: N/A    Social History Main Topics  . Smoking status: Current Every Day Smoker    Packs/day: 0.10    Years: 54.00    Types: Cigarettes  . Smokeless tobacco: Never Used  . Alcohol use Yes     Comment: 04/02/2016 "aint suppose to drink in the nursing home"  . Drug use: Yes    Types: Cocaine, Marijuana     Comment: marijuana use daily, rare cocain use.  Previously did snort and inject drugs through 2017. Hasn't had access to street drugs from 10/201 7 through February/201 8  . Sexual activity: Not Currently   Other Topics Concern  . Not on file   Social History Narrative   As of fall/2017 patient had been living in [place] but then entered SNF in [place]. His wife passed away in summer 2017.    Allergies:  Allergies  Allergen Reactions  . Hydralazine     4/21-4/26/18 pleuritic chest pain with bilateral effusions and pericardial rub. Rule out hydralazine drug-induced lupus   . Percocet [Oxycodone-Acetaminophen]     Needing narcan on multiple occasions due to oversedation after getting percocet   . Tramadol     Excess sedation; ? Related to pre-existing hepatic dysfunction with PMH Hep C    Objective:    Vital Signs:   Temp:  [97.6 F (36.4 C)-98.7 F (37.1 C)] 97.8 F (36.6 C) (05/16 0700) Pulse Rate:  [70-86] 71 (05/16 0800) Resp:  [12-22] 16 (05/16 0800) BP: (48-143)/(38-91) 100/66 (05/16 0800) SpO2:  [95 %-100 %] 100 % (05/16 0800) FiO2 (%):  [40 %-60 %] 40 % (05/16 0800) Weight:  [169 lb 15.6 oz (77.1 kg)-171 lb (77.6 kg)] 169 lb 15.6 oz (77.1 kg) (05/16 0400) Last BM Date:  (PTA)  Weight change: Filed Weights   08/26/16 2355 08/27/16 0400  Weight: 171 lb (77.6 kg) 169 lb 15.6 oz (77.1 kg)    Intake/Output:   Intake/Output Summary (Last 24 hours) at 08/27/16 0835 Last data filed at 08/27/16 0800  Gross per 24 hour  Intake          1796.76 ml  Output              775 ml  Net          1021.76 ml     Physical Exam: CVP 10  General: Ill appearing intubated  male, NAD.  HEENT: ETT in place.  Neck: supple. 7-8 cm JVP . Carotids 2+ bilat; no bruits. No lymphadenopathy or thyromegaly appreciated. Cor: PMI nondisplaced. Regular rate & rhythm. Lungs: Rhochi in bilateral upper lobes anteriorly.  Abdomen: soft, nontender, nondistended. No hepatosplenomegaly. No bruits or masses. Good bowel sounds. Extremities: no cyanosis, clubbing, rash, edema. Warm. S/p right AKA.  Neuro: Intubated, opens eyes to voice.    Telemetry: NSR rates in the 60's.   Labs: Basic Metabolic Panel:  Recent Labs Lab 08/27/16 0000 08/27/16 0411  NA 128* 128*  K 3.8 3.8  CL 95* 97*  CO2 25 23  GLUCOSE 61* 80  BUN 41* 40*  CREATININE 1.43* 1.40*  CALCIUM 8.3* 8.0*  MG  --  1.9  PHOS  --  3.3    Liver Function Tests:  Recent Labs Lab 08/27/16 0000  AST 28  ALT 16*  ALKPHOS 141*  BILITOT 1.0  PROT 7.1  ALBUMIN 2.4*   No results for input(s): LIPASE, AMYLASE in the last 168 hours. No results for input(s): AMMONIA in the last 168 hours.  CBC:  Recent Labs Lab 08/27/16 0000 08/27/16 0411  WBC 9.6 10.9*  NEUTROABS 8.2*  --   HGB 9.7* 9.6*  HCT 31.3* 30.8*  MCV 85.3 85.6  PLT 268 273    Cardiac Enzymes:  Recent Labs Lab 08/27/16 0016 08/27/16 0411  TROPONINI 0.03* 0.07*    BNP: BNP (last 3 results)  Recent Labs  08/02/16 1610 08/27/16 0016  BNP 1,021.2* 1,093.5*    ProBNP (last 3 results) No results for input(s): PROBNP in the last 8760 hours.   CBG:  Recent Labs Lab 08/26/16 2355 08/27/16 0038 08/27/16 0233 08/27/16 0354 08/27/16 0724  GLUCAP 65 207* 106* 85 109*    Coagulation Studies:  Recent Labs  08/27/16 0000  LABPROT 18.9*  INR 1.57      Imaging: Dg Chest Portable 1 View  Result Date: 08/27/2016 CLINICAL DATA:  Central line placement EXAM: PORTABLE CHEST 1 VIEW COMPARISON:  08/27/2016 at 0059 hours FINDINGS: New right-sided IJ central line catheter is seen at the cavoatrial junction. No pneumothorax  is noted. Heart is borderline enlarged. Patient is status post CABG and mitral valvular replacement. Endotracheal tube is 6 cm above the carina. Gastric tube extends below the left hemidiaphragm. Loculated pleural effusion is again seen along the periphery the right lung. Small left effusion is also present unchanged in appearance. IMPRESSION: 1. No pneumothorax after right IJ central line placement. The tip is seen at the cavoatrial junction. 2. Satisfactory support line and tube positions. 3. Bilateral pleural effusions right greater than left with loculation of fluid along the periphery of the right lung as before. Electronically Signed   By: Ashley Royalty M.D.   On: 08/27/2016 02:00   Dg Chest Portable 1 View  Result Date: 08/27/2016 CLINICAL DATA:  Endotracheal and gastric tube placement EXAM: PORTABLE CHEST 1 VIEW COMPARISON:  None. FINDINGS: The tip of a gastric tube is seen coiled in the left upper quadrant of the abdomen in the expected location the stomach. An endotracheal tube is noted with tip approximately 5.1 cm above the carina. The patient is status post median sternotomy and CABG. Heart is top-normal. There is mild aortic atherosclerosis. Bilateral pleural effusions moderate to large on the right and small on the left  are again seen without change. No acute nor suspicious osseous abnormality. IMPRESSION: Satisfactory support line and tube positions. Status post CABG. Right greater than left pleural effusions remain unchanged. Electronically Signed   By: Ashley Royalty M.D.   On: 08/27/2016 01:12   Dg Chest Portable 1 View  Result Date: 08/27/2016 CLINICAL DATA:  Suspected sepsis. Shortness of breath, weakness and cough today. EXAM: PORTABLE CHEST 1 VIEW COMPARISON:  Radiograph 08/04/2016 FINDINGS: Post median sternotomy with prosthetic presumed mitral valve. Cardiomediastinal contours are grossly stable, partially obscured by right-sided pulmonary opacity. Increased right pleural effusion with  hazy opacity in the right mid lower lung zone and pleural fluid tracking about the lateral chest. Suspect small left pleural effusion. There is vascular congestion and probable perihilar edema. No pneumothorax. IMPRESSION: Reaccumulation right pleural effusion, moderate to large in size. Small left pleural effusion. Vascular congestion and probable pulmonary edema. Electronically Signed   By: Jeb Levering M.D.   On: 08/27/2016 00:21      Medications:     Current Medications: . aspirin  81 mg Per Tube Daily  . atorvastatin  20 mg Per Tube q1800  . chlorhexidine gluconate (MEDLINE KIT)  15 mL Mouth Rinse BID  . heparin  5,000 Units Subcutaneous Q8H  . hydrocortisone sod succinate (SOLU-CORTEF) inj  50 mg Intravenous Q6H  . insulin aspart  0-15 Units Subcutaneous Q4H  . ipratropium-albuterol  3 mL Nebulization Q6H  . levothyroxine  12.5 mcg Intravenous Daily  . mouth rinse  15 mL Mouth Rinse QID  . pantoprazole (PROTONIX) IV  40 mg Intravenous QHS  . phenylephrine         Infusions: . sodium chloride    . norepinephrine (LEVOPHED) Adult infusion 2 mcg/min (08/27/16 1740)      Assessment/Plan   1. Acute on chronic combined systolic and diastolic CHF: EF 81-44%. Presented with respiratory failure, chest x ray with pulmonary edema, BNP elevated.  - Co ox 70, does not appear low output.  - Volume status stable, CVP 10. - Will give 67m IV Lasix BID today.  2. Bilateral pleural effusions: R>L - CCM following, could need repeat thoracentesis.  3. PAF - Not on anticoagulation, likely due to previous GI bleed with cirrhosis.  - In NSR today.  4. Acute on chronic CKD - Creatinine 1.40 - baseline 1.2-1.4.  5. DM - Continue sliding scale.  6. History of ETOH cirrhosis.   Length of Stay: 0[place] NP  08/27/2016, 8:35 AM  Advanced Heart Failure Team Pager [phone](M-F; 7a - 4p)  Please contact [facility] for night-coverage after hours (4p -7a ) and weekends on  [email or web address]  Patient seen with NP, agree with the above note.  1. Acute on chronic systolic CHF: EF 349-70%in 4/18, ischemic cardiomyopathy.   CXR with recurrent right pleural effusion, BNP elevated.  He is currently on norepinephrine @ 7 with CVP 10.  Co-ox 70%.   - Lasix 40 mg IV bid today, follow creatinine (mildly elevated from baseline).  - Titrate off norepinephrine as able.  2. Shock: With PCT elevated to 11, UA suggestive of UTI, I am concerned that he is infected with a component of septic shock. Overall probably mixed source of shock: septic, cardiogenic, related to sedation.  - Wean off norepinephrine as able.  - Broad spectrum antibiotics => vanco/Zosyn if ok with CCM.  3. CAD: h/o CABG.  Mild rise in troponin with no trend is likely demand ischemia.  Continue ASA  81 and statin.  4. Pleural effusion: Appears large, on right, recurrent.  Would favor repeat thoracentesis at some point, likely after extubated.  5. Atrial fibrillation: Paroxysmal, he is in NSR.  He has not been anticoagulated due to history of GI bleeding/cirrhosis.  6. CKD: Stage 3.  Creatinine at the upper end of his typical range.  Follow with gentle diuresis.  7. 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  8. Altered mental status: Presented with AMS, intubated.  Likely due to combination of hypoglycemia, infection, shock.  9. PAD: Right AKA.   Loralie Champagne 08/27/2016 11:02 AM

## 2016-08-27 NOTE — Progress Notes (Signed)
ABG held due to significant hypotension and patient being prepared for Intubation. MD C. Horton aware

## 2016-08-27 NOTE — ED Provider Notes (Signed)
[facility]-EMERGENCY DEPT Provider Note   CSN: [id number] Arrival date & time:     By signing my name below, I, Ronald Simpson, attest that this documentation has been prepared under the direction and in the presence of Horton, Mayer Masker, MD . Electronically Signed: Phillips Simpson, Scribe. 08/27/2016. 2:21 AM.   History   Chief Complaint Chief Complaint  Patient presents with  . Shortness of Breath  . Hypoglycemia   Ronald Simpson is a 71 y.o. male with a PMHx of multiple medical co-morbidities, including CVA, PVD, Hep C, CAD, DM and CKD, who presents to the Emergency Department from his long term care facility, with reports of hypoglycemia and dyspnea. Per EMS, [facility] noticed that pt was non-responsive. Blood glucose level was in the low 30s at the facility and 40s en route. 65 in triage.   Here, pt endorses dyspnea and a cough, though he is very difficult to understand. SOB not worsened by laying flat. He is A&Ox3 and full code. No reported fevers. He denies any chest pain. He was administered a DuoNeb en route. Unclear whether he was administered any glucose in route.  Patient recently admitted and had a thoracentesis for an accumulated right-sided pleural effusion. Thought to be multifactorial and related to heart failure as well as cirrhosis and protein status.  LEVEL 5 CAVEAT DUE TO ACUITY OF CONDITION.   The history is provided by the patient and medical records. No language interpreter was used.    Past Medical History:  Diagnosis Date  . CAD (coronary artery disease)    a. remote CABG.  . Chronic anemia   . Chronic combined systolic and diastolic CHF (congestive heart failure) (HCC)   . Cirrhosis (HCC)   . CKD (chronic kidney disease), stage III   . CVA (cerebral infarction)   . DM (diabetes mellitus), type 2 with peripheral vascular complications (HCC)   . History of hepatitis C 1990s   Hep B immune  . Hypoalbuminemia   . IDDM  (insulin dependent diabetes mellitus) (HCC)   . Mitral regurgitation    a. s/p prior ring annuloplasty prosthesis per echo with mod MR by echo 07/2016.  Marland Kitchen PAF (paroxysmal atrial fibrillation) (HCC)    a. not on anticoagulation likely due to h/o IC hemorrhage, prior GIB, cirrhosis. b. suspected prior LAA clipping based on imaging.  . Peripheral neuropathy   . Peripheral vascular disease (HCC)   . Proteinuria     Patient Active Problem List   Diagnosis Date Noted  . Respiratory failure with hypoxia (HCC) 08/27/2016  . Chronic bronchitis (HCC) 08/21/2016  . Positive ANA (antinuclear antibody) 08/21/2016  . Pericarditis 08/12/2016  . Acute on chronic combined systolic and diastolic CHF (congestive heart failure) (HCC) 08/05/2016  . Acute kidney injury superimposed on chronic kidney disease (HCC) 08/05/2016  . Chest pain 08/02/2016  . Pleuritic chest pain 08/02/2016  . Pleural effusion, bilateral 08/02/2016  . Elevated brain natriuretic peptide (BNP) level 08/02/2016  . Chronic anemia 07/22/2016  . Supratherapeutic international normalized ratio (INR) 07/17/2016  . Ascites 06/27/2016  . Chronic anticoagulation   . Cirrhosis (HCC)   . Acute GI bleeding 05/15/2016  . Melanotic stools 05/15/2016  . Altered mental status 04/10/2016  . Gangrene of toe of left foot (HCC)   . Cystitis   . 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 At risk for adverse drug event 02/14/2016  . Peripheral neuropathy 01/31/2016  . Scrotal edema 01/15/2016  .  Dry gangrene (HCC) 01/15/2016  . Renal insufficiency 01/15/2016  . PAD (peripheral artery disease) (HCC) 01/11/2016  . 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 Tobacco abuse   . Tachycardia   . Hyponatremia   . Hypoalbuminemia   . MRSA cellulitis of left foot   . Serratia marcescens infection (HCC)   . Escherichia coli infection   . CAD in native artery   . Paroxysmal  atrial fibrillation (HCC)   . Uncontrolled type 2 diabetes mellitus with complication (HCC)   . Hepatitis C without hepatic coma 12/27/2015  . Coronary artery disease due to lipid rich plaque 12/27/2015  . Diabetes mellitus with peripheral vascular disease (HCC) 12/27/2015  . History of CVA (cerebrovascular accident) 12/27/2015  . AKI (acute kidney injury) (HCC) 12/27/2015  . Hypothyroidism, acquired 12/27/2015  . Benign essential HTN 12/27/2015  . Sepsis (HCC) 12/26/2015    Past Surgical History:  Procedure Laterality Date  . AMPUTATION Right 01/07/2016   Procedure: AMPUTATION ABOVE KNEE;  Surgeon: Larina Earthly, MD;  Location: [facility] OR;  Service: Vascular;  Laterality: Right;  . AMPUTATION Left 04/04/2016   Procedure: LEFT FOURTH AND FIFTH  TOE AMPUTATION;  Surgeon: Larina Earthly, MD;  Location: [facility] OR;  Service: Vascular;  Laterality: Left;  . BELOW KNEE LEG AMPUTATION Right   . CORONARY ARTERY BYPASS GRAFT  2005  . ESOPHAGOGASTRODUODENOSCOPY N/A 05/17/2016   Procedure: ESOPHAGOGASTRODUODENOSCOPY (EGD);  Surgeon: Meryl Dare, MD;  Location: [facility] ENDOSCOPY;  Service: Endoscopy;  Laterality: N/A;  . IR GENERIC HISTORICAL  07/07/2016   IR PARACENTESIS 07/07/2016 Gershon Crane, PA-C [facility]-INTERV RAD  . IR THORACENTESIS ASP PLEURAL SPACE W/IMG GUIDE  08/04/2016  . PERIPHERAL VASCULAR CATHETERIZATION N/A 01/11/2016   Procedure: Lower Extremity Angiography;  Surgeon: Nada Libman, MD;  Location: [facility] INVASIVE CV LAB;  Service: Cardiovascular;  Laterality: N/A;  . STERNOTOMY         Home Medications    Prior to Admission medications   Medication Sig Start Date End Date Taking? Authorizing Provider  Amino Acids-Protein Hydrolys (FEEDING SUPPLEMENT, PRO-STAT SUGAR FREE 64,) LIQD Take 30 mLs by mouth 2 (two) times daily.    [provider]  aspirin EC 81 MG EC tablet Take 1 tablet (81 mg total) by mouth daily. 08/08/16   Richarda Overlie, MD  atorvastatin (LIPITOR) 20 MG tablet Take 20  mg by mouth daily.    [provider]  barrier cream (NON-SPECIFIED) CREA Apply 1 application topically 2 (two) times daily.    [provider]  carvedilol (COREG) 6.25 MG tablet Take 6.25 mg by mouth 2 (two) times daily with a meal.    [provider]  colchicine 0.6 MG tablet Take 1 tablet (0.6 mg total) by mouth 2 (two) times daily. 08/07/16 09/06/16  Richarda Overlie, MD  docusate sodium (COLACE) 100 MG capsule Take 100 mg by mouth 2 (two) times daily.    [provider]  DULoxetine (CYMBALTA) 30 MG capsule Take 30 mg by mouth daily.    [provider]  feeding supplement, ENSURE ENLIVE, (ENSURE ENLIVE) LIQD Take 237 mLs by mouth 2 (two) times daily between meals. 08/07/16   Richarda Overlie, MD  ferrous sulfate 325 (65 FE) MG tablet Take 325 mg by mouth 2 (two) times daily with a meal.    [provider]  furosemide (LASIX) 40 MG tablet Take 40 mg by mouth daily.     [provider]  insulin glargine (LANTUS) 100 unit/mL SOPN Inject 17 Units into the skin at bedtime.    [provider]  ipratropium-albuterol (DUONEB) 0.5-2.5 (3) MG/3ML SOLN Take 3 mLs by nebulization every 6 (six) hours as needed (shortness of breath).     [provider]  isosorbide mononitrate (IMDUR) 30 MG 24 hr tablet Take 1 tablet (30 mg total) by mouth daily. 01/14/16   Jeralyn Bennett, MD  levothyroxine (SYNTHROID, LEVOTHROID) 25 MCG tablet Take 25 mcg by mouth daily before breakfast.    [provider]  nitroGLYCERIN (NITROSTAT) 0.4 MG SL tablet Place 0.4 mg under the tongue every 5 (five) minutes as needed for chest pain.    [provider]  OXYGEN Inhale into the lungs. 2lpm via [place] prn for shortness of breath or O2 sat <90%    [provider]  pantoprazole (PROTONIX) 40 MG tablet Take 1 tablet (40 mg total) by mouth daily. 05/22/16   Rodolph Bong, MD  polyethylene glycol [facility] / Ethelene Hal) packet Take 17 g by mouth  daily.    [provider]  sertraline (ZOLOFT) 25 MG tablet Take 25 mg by mouth daily.    [provider]    Family History Family History  Problem Relation Age of Onset  . Hypertension Other   . Diabetes Other   . Mental illness Other   . Lupus Other   . Diabetes Father   . Cancer Neg Hx   . Heart disease Neg Hx   . Stroke Neg Hx     Social History Social History  Substance Use Topics  . Smoking status: Current Every Day Smoker    Packs/day: 0.10    Years: 54.00    Types: Cigarettes  . Smokeless tobacco: Never Used  . Alcohol use Yes     Comment: 04/02/2016 "aint suppose to drink in the nursing home"     Allergies   Hydralazine; Percocet [oxycodone-acetaminophen]; and Tramadol   Review of Systems Review of Systems  Unable to perform ROS: Acuity of condition  Constitutional: Negative for fever.  Respiratory: Positive for cough and shortness of breath.   Cardiovascular: Negative for chest pain and leg swelling.  Psychiatric/Behavioral: Positive for confusion.  All other systems reviewed and are negative.  LEVEL 5 CAVEAT DUE TO ACUITY OF CONDITION.   Physical Exam Updated Vital Signs BP 107/77   Pulse 75   Temp 98.7 F (37.1 C) (Rectal)   Resp 16   Ht 5\' 8"  (1.727 m)   Wt 171 lb (77.6 kg)   SpO2 100%   BMI 26.00 kg/m   Physical Exam  Constitutional: He is oriented to person, place, and time.  Ill-appearing, no acute distress  HENT:  Head: Normocephalic and atraumatic.  Poor dentition  Eyes: Pupils are equal, round, and reactive to light.  Pupils 3 mm reactive bilaterally  Neck: Neck supple.  Cardiovascular: Normal rate, regular rhythm and normal heart sounds.   No murmur heard. JVD to the jaw  Pulmonary/Chest: He is in respiratory distress. He has no wheezes. He has rales.  Tachypnea, coarse and rhonchorous breath sounds in all lung fields, diminished breath sounds right lower lobe  Abdominal: Soft. Bowel sounds are normal. There  is no tenderness. There is no rebound.  Musculoskeletal: He exhibits no edema.  Right AKA  Lymphadenopathy:    He has no cervical adenopathy.  Neurological: He is alert and oriented to person, place, and time.  Generally difficult to understand  Skin:  Skin is warm and dry.  Psychiatric: He has a normal mood and affect.  Nursing note and vitals reviewed.    ED Treatments / Results  Labs (all labs ordered are listed, but only abnormal results are displayed) Labs Reviewed  COMPREHENSIVE METABOLIC PANEL - Abnormal; Notable for the following:       Result Value   Sodium 128 (*)    Chloride 95 (*)    Glucose, Bld 61 (*)    BUN 41 (*)    Creatinine, Ser 1.43 (*)    Calcium 8.3 (*)    Albumin 2.4 (*)    ALT 16 (*)    Alkaline Phosphatase 141 (*)    GFR calc non Af Amer 49 (*)    GFR calc Af Amer 57 (*)    All other components within normal limits  CBC WITH DIFFERENTIAL/PLATELET - Abnormal; Notable for the following:    RBC 3.67 (*)    Hemoglobin 9.7 (*)    HCT 31.3 (*)    RDW 18.9 (*)    Neutro Abs 8.2 (*)    Lymphs Abs 0.4 (*)    All other components within normal limits  PROTIME-INR - Abnormal; Notable for the following:    Prothrombin Time 18.9 (*)    All other components within normal limits  TROPONIN I - Abnormal; Notable for the following:    Troponin I 0.03 (*)    All other components within normal limits  BRAIN NATRIURETIC PEPTIDE - Abnormal; Notable for the following:    B Natriuretic Peptide 1,093.5 (*)    All other components within normal limits  CBG MONITORING, ED - Abnormal; Notable for the following:    Glucose-Capillary 207 (*)    All other components within normal limits  CULTURE, BLOOD (ROUTINE X 2)  CULTURE, BLOOD (ROUTINE X 2)  URINALYSIS, ROUTINE W REFLEX MICROSCOPIC  CBC  BASIC METABOLIC PANEL  BLOOD GAS, ARTERIAL  MAGNESIUM  PHOSPHORUS  CBG MONITORING, ED  I-STAT CG4 LACTIC ACID, ED  I-STAT ARTERIAL BLOOD GAS, ED  CBG MONITORING, ED     EKG  EKG Interpretation None     ED ECG REPORT   Date: 08/27/2016  Rate: 84  Rhythm: normal sinus rhythm  QRS Axis: normal  Intervals: normal  ST/T Wave abnormalities: nonspecific T wave changes  Conduction Disutrbances:none  Narrative Interpretation:   Old EKG Reviewed: unchanged  I have personally reviewed the EKG tracing and agree with the computerized printout as noted.   Radiology Dg Chest Portable 1 View  Result Date: 08/27/2016 CLINICAL DATA:  Central line placement EXAM: PORTABLE CHEST 1 VIEW COMPARISON:  08/27/2016 at 0059 hours FINDINGS: New right-sided IJ central line catheter is seen at the cavoatrial junction. No pneumothorax is noted. Heart is borderline enlarged. Patient is status post CABG and mitral valvular replacement. Endotracheal tube is 6 cm above the carina. Gastric tube extends below the left hemidiaphragm. Loculated pleural effusion is again seen along the periphery the right lung. Small left effusion is also present unchanged in appearance. IMPRESSION: 1. No pneumothorax after right IJ central line placement. The tip is seen at the cavoatrial junction. 2. Satisfactory support line and tube positions. 3. Bilateral pleural effusions right greater than left with loculation of fluid along the periphery of the right lung as before. Electronically Signed   By: Tollie Eth M.D.   On: 08/27/2016 02:00   Dg Chest Portable 1 View  Result Date: 08/27/2016 CLINICAL DATA:  Endotracheal and gastric tube placement EXAM:  PORTABLE CHEST 1 VIEW COMPARISON:  None. FINDINGS: The tip of a gastric tube is seen coiled in the left upper quadrant of the abdomen in the expected location the stomach. An endotracheal tube is noted with tip approximately 5.1 cm above the carina. The patient is status post median sternotomy and CABG. Heart is top-normal. There is mild aortic atherosclerosis. Bilateral pleural effusions moderate to large on the right and small on the left are again seen  without change. No acute nor suspicious osseous abnormality. IMPRESSION: Satisfactory support line and tube positions. Status post CABG. Right greater than left pleural effusions remain unchanged. Electronically Signed   By: Tollie Ethavid  Kwon M.D.   On: 08/27/2016 01:12   Dg Chest Portable 1 View  Result Date: 08/27/2016 CLINICAL DATA:  Suspected sepsis. Shortness of breath, weakness and cough today. EXAM: PORTABLE CHEST 1 VIEW COMPARISON:  Radiograph 08/04/2016 FINDINGS: Post median sternotomy with prosthetic presumed mitral valve. Cardiomediastinal contours are grossly stable, partially obscured by right-sided pulmonary opacity. Increased right pleural effusion with hazy opacity in the right mid lower lung zone and pleural fluid tracking about the lateral chest. Suspect small left pleural effusion. There is vascular congestion and probable perihilar edema. No pneumothorax. IMPRESSION: Reaccumulation right pleural effusion, moderate to large in size. Small left pleural effusion. Vascular congestion and probable pulmonary edema. Electronically Signed   By: Rubye OaksMelanie  Ehinger M.D.   On: 08/27/2016 00:21    Procedures .Central Line Date/Time: 08/27/2016 2:16 AM Performed by: Shon BatonHORTON, COURTNEY F Authorized by: Shon BatonHORTON, COURTNEY F   Consent:    Consent obtained:  Emergent situation Pre-procedure details:    Hand hygiene: Hand hygiene performed prior to insertion     Sterile barrier technique: All elements of maximal sterile technique followed     Skin preparation:  2% chlorhexidine   Skin preparation agent: Skin preparation agent completely dried prior to procedure   Sedation:    Sedation type:  Anxiolysis Procedure details:    Location:  R internal jugular   Patient position:  Reverse Trendelenburg   Procedural supplies:  Triple lumen   Landmarks identified: yes     Ultrasound guidance: yes     Sterile ultrasound techniques: Sterile gel and sterile probe covers were used     Number of attempts:  1    Successful placement: yes   Post-procedure details:    Post-procedure:  Dressing applied   Assessment:  Blood return through all ports, no pneumothorax on x-ray and placement verified by x-ray   Patient tolerance of procedure:  Tolerated well, no immediate complications    (including critical care time)  CRITICAL CARE Performed by: Shon BatonHORTON, COURTNEY F   Total critical care time: 60 minutes  Critical care time was exclusive of separately billable procedures and treating other patients.  Critical care was necessary to treat or prevent imminent or life-threatening deterioration.  Critical care was time spent personally by me on the following activities: development of treatment plan with patient and/or surrogate as well as nursing, discussions with consultants, evaluation of patient's response to treatment, examination of patient, obtaining history from patient or surrogate, ordering and performing treatments and interventions, ordering and review of laboratory studies, ordering and review of radiographic studies, pulse oximetry and re-evaluation of patient's condition.    EMERGENCY DEPARTMENT US CARDIAC EXAM "Study: Limited Ultrasound of the Heart and Pericardium"  INDICATIONS:Dyspnea Multiple views of the heart and pericardium were obtained in real-time with a multi-frequency probe.  PERFORMED ZO:XWRUEABY:Myself IMAGES ARCHIVED?: Yes LIMITATIONS:  Emergent procedure  VIEWS USED: Parasternal long axis INTERPRETATION: Pericardial effusioin absent and Decreased contractility   Medications Ordered in ED Medications  phenylephrine 0.4-0.9 MG/10ML-% injection (400 mcg  See Procedure Record 08/27/16 0200)  etomidate (AMIDATE) injection (20 mg Intravenous Given 08/27/16 0047)  succinylcholine (ANECTINE) injection (150 mg Intravenous Given 08/27/16 0047)  norepinephrine (LEVOPHED) 4 mg in dextrose 5 % 250 mL (0.016 mg/mL) infusion (18 mcg/min Intravenous Rate/Dose Change 08/27/16 0216)  fentaNYL  (SUBLIMAZE) injection 50 mcg (not administered)  fentaNYL (SUBLIMAZE) injection 50 mcg (not administered)  midazolam (VERSED) injection 1 mg (not administered)  midazolam (VERSED) injection 1 mg (not administered)  heparin injection 5,000 Units (not administered)  pantoprazole (PROTONIX) injection 40 mg (not administered)  0.9 %  sodium chloride infusion (not administered)  ceFEPIme (MAXIPIME) 2 g in dextrose 5 % 50 mL IVPB (0 g Intravenous Stopped 08/27/16 0106)  vancomycin (VANCOCIN) IVPB 1000 mg/200 mL premix (0 mg Intravenous Stopped 08/27/16 0200)  dextrose 50 % solution 50 mL (50 mLs Intravenous Given 08/27/16 0030)  sodium chloride 0.9 % bolus 2,500 mL (0 mLs Intravenous Stopped 08/27/16 0154)  fentaNYL (SUBLIMAZE) 100 MCG/2ML injection (100 mcg  Given 08/27/16 0145)  midazolam (VERSED) 2 MG/2ML injection (1 mg  Given 08/27/16 0116)     Initial Impression / Assessment and Plan / ED Course  I have reviewed the triage vital signs and the nursing notes.  Pertinent labs & imaging results that were available during my care of the patient were reviewed by me and considered in my medical decision making (see chart for details).     Patient presents with hyperglycemia and hypoxia. Also noted to be hypertensive. Recent admission for pleural effusion. Initially patient was alert and oriented 3 but somewhat somnolent. He was requiring supplemental oxygen. Pulmonary exam grossly abnormal. Sepsis workup initiated. However, initial x-ray shows reaccumulation of pleural effusion and vascular congestion. Lactate is normal, white count is normal, patient is afebrile. Because of this, feel that his presentation is less likely septic shock. Known EF of 35-40%. Given his JVD and pulmonary edema, feel that he is likely more in cardiogenic shock. I discussed this with the critical care doc who agrees. Patient was given a total of 1 L of fluid. Further fluid resuscitation was stopped. He was given broad-spectrum  antibiotics. Levophed was started for persistent hypotension. Central line was placed. EKG shows no evidence of acute ischemia. Troponin is 0.03. I did discuss with cardiology, Carolanne Grumbling. Dr. Sampson Goon previously saw the patient during recent admission. Dr. Mayford Knife is aware of the patient. Given that he is stable at this time, cardiology consulted first thing in the morning. If she is needed, please page emergently this evening.  Final Clinical Impressions(s) / ED Diagnoses   Final diagnoses:  Cardiogenic shock (HCC)  Pleural effusion  Acute on chronic systolic heart failure (HCC)  Hypoglycemia    New Prescriptions New Prescriptions   No medications on file   I personally performed the services described in this documentation, which was scribed in my presence. The recorded information has been reviewed and is accurate.    Shon Baton, MD 08/27/16 [phone]

## 2016-08-27 NOTE — H&P (Signed)
PULMONARY / CRITICAL CARE MEDICINE   Name: Ronald Simpson MRN: [id number] DOB: [birth year]    ADMISSION DATE:  08/26/2016 CONSULTATION DATE:  08/27/16  REFERRING MD:  Horton - EDP  CHIEF COMPLAINT:  SOB  HISTORY OF PRESENT ILLNESS:  Pt is encephelopathic; therefore, this HPI is obtained from chart review. Ronald Simpson is a 71 y.o. male with PMH as outlined below and who resides at [facility].  He was brought to [facility] ED 5/15 with decreased level of consciousness along with hypoglycemia (CBG 30s initially) and SOB.  Upon arrival to ED, repeat CBG mid 60s and pt was noted to be in obvious respiratory distress. He was subsequently intubated and PCCM was called for admission.  CXR c/w pulmonary edema and bilateral pleural effusions.  Of note, he had admission 08/02/16 through 08/07/16.  During that admission, he had right sided thoracentesis by IR on 4/23 which was transudative and felt to be due to CHF and cirrhosis.  Following intubation, he remained hypotensive so had CVL placed and was started on levophed.  PAST MEDICAL HISTORY :  He  has a past medical history of CAD (coronary artery disease); Chronic anemia; Chronic combined systolic and diastolic CHF (congestive heart failure) (HCC); Cirrhosis (HCC); CKD (chronic kidney disease), stage III; CVA (cerebral infarction); DM (diabetes mellitus), type 2 with peripheral vascular complications (HCC); History of hepatitis C (1990s); Hypoalbuminemia; IDDM (insulin dependent diabetes mellitus) (HCC); Mitral regurgitation; PAF (paroxysmal atrial fibrillation) (HCC); Peripheral neuropathy; Peripheral vascular disease (HCC); and Proteinuria.  PAST SURGICAL HISTORY: He  has a past surgical history that includes Below knee leg amputation (Right); Sternotomy; Coronary artery bypass graft (2005); Amputation (Right, 01/07/2016); Cardiac catheterization (N/A, 01/11/2016); Amputation (Left, 04/04/2016); Esophagogastroduodenoscopy (N/A, 05/17/2016); ir generic  historical (07/07/2016); and IR THORACENTESIS ASP PLEURAL SPACE W/IMG GUIDE (08/04/2016).  Allergies  Allergen Reactions  . Hydralazine     4/21-4/26/18 pleuritic chest pain with bilateral effusions and pericardial rub. Rule out hydralazine drug-induced lupus   . Percocet [Oxycodone-Acetaminophen]     Needing narcan on multiple occasions due to oversedation after getting percocet   . Tramadol     Excess sedation; ? Related to pre-existing hepatic dysfunction with PMH Hep C    No current facility-administered medications on file prior to encounter.    Current Outpatient Prescriptions on File Prior to Encounter  Medication Sig  . Amino Acids-Protein Hydrolys (FEEDING SUPPLEMENT, PRO-STAT SUGAR FREE 64,) LIQD Take 30 mLs by mouth 2 (two) times daily.  Marland Kitchen aspirin EC 81 MG EC tablet Take 1 tablet (81 mg total) by mouth daily.  Marland Kitchen atorvastatin (LIPITOR) 20 MG tablet Take 20 mg by mouth daily.  . barrier cream (NON-SPECIFIED) CREA Apply 1 application topically 2 (two) times daily.  . carvedilol (COREG) 6.25 MG tablet Take 6.25 mg by mouth 2 (two) times daily with a meal.  . colchicine 0.6 MG tablet Take 1 tablet (0.6 mg total) by mouth 2 (two) times daily.  Marland Kitchen docusate sodium (COLACE) 100 MG capsule Take 100 mg by mouth 2 (two) times daily.  . DULoxetine (CYMBALTA) 30 MG capsule Take 30 mg by mouth daily.  . feeding supplement, ENSURE ENLIVE, (ENSURE ENLIVE) LIQD Take 237 mLs by mouth 2 (two) times daily between meals.  . ferrous sulfate 325 (65 FE) MG tablet Take 325 mg by mouth 2 (two) times daily with a meal.  . furosemide (LASIX) 40 MG tablet Take 40 mg by mouth daily.   . insulin glargine (LANTUS) 100 unit/mL SOPN Inject 17 Units  into the skin at bedtime.  Marland Kitchen ipratropium-albuterol (DUONEB) 0.5-2.5 (3) MG/3ML SOLN Take 3 mLs by nebulization every 6 (six) hours as needed (shortness of breath).   . isosorbide mononitrate (IMDUR) 30 MG 24 hr tablet Take 1 tablet (30 mg total) by mouth daily.  Marland Kitchen  levothyroxine (SYNTHROID, LEVOTHROID) 25 MCG tablet Take 25 mcg by mouth daily before breakfast.  . nitroGLYCERIN (NITROSTAT) 0.4 MG SL tablet Place 0.4 mg under the tongue every 5 (five) minutes as needed for chest pain.  . OXYGEN Inhale into the lungs. 2lpm via [place] prn for shortness of breath or O2 sat <90%  . pantoprazole (PROTONIX) 40 MG tablet Take 1 tablet (40 mg total) by mouth daily.  . polyethylene glycol (MIRALAX / GLYCOLAX) packet Take 17 g by mouth daily.  . sertraline (ZOLOFT) 25 MG tablet Take 25 mg by mouth daily.    FAMILY HISTORY:  His indicated that his mother is deceased. He indicated that his father is deceased. He indicated that his maternal grandmother is deceased. He indicated that his maternal grandfather is deceased. He indicated that his paternal grandmother is deceased. He indicated that his paternal grandfather is deceased. He indicated that the status of his neg hx is unknown. He indicated that the status of his other is unknown.    SOCIAL HISTORY: He  reports that he has been smoking Cigarettes.  He has a 5.40 pack-year smoking history. He has never used smokeless tobacco. He reports that he drinks alcohol. He reports that he uses drugs, including Cocaine and Marijuana.  REVIEW OF SYSTEMS:   Unable to obtain as pt is encephalopathic.  SUBJECTIVE:  On vent, unresponsive.  VITAL SIGNS: BP 140/90   Pulse 81   Temp 98.7 F (37.1 C) (Rectal)   Resp 16   Ht 5\' 8"  (1.727 m)   Wt 77.6 kg (171 lb)   SpO2 100%   BMI 26.00 kg/m   HEMODYNAMICS:    VENTILATOR SETTINGS: Vent Mode: PRVC FiO2 (%):  [60 %] 60 % Set Rate:  [16 bmp] 16 bmp Vt Set:  [540 mL] 540 mL PEEP:  [5 cmH20] 5 cmH20 Plateau Pressure:  [21 cmH20] 21 cmH20  INTAKE / OUTPUT: No intake/output data recorded.   PHYSICAL EXAMINATION: General: Adult male, chronically ill appearing, in NAD. Neuro: Sedated, non-responsive. HEENT: [place]/AT. PERRL, sclerae anicteric. ETT in place. Cardiovascular:  RRR, 2/6 SEM. Lungs: Respirations unlabored.  Coarse bilaterally R > L. Abdomen: BS x 4, soft, NT/ND.  Musculoskeletal: Right AKA, no edema. Left foot in curlex dressings. Skin: See above, warm, no rashes.  LABS:  BMET  Recent Labs Lab 08/27/16 0000  NA 128*  K 3.8  CL 95*  CO2 25  BUN 41*  CREATININE 1.43*  GLUCOSE 61*    Electrolytes  Recent Labs Lab 08/27/16 0000  CALCIUM 8.3*    CBC  Recent Labs Lab 08/27/16 0000  WBC 9.6  HGB 9.7*  HCT 31.3*  PLT 268    Coag's  Recent Labs Lab 08/27/16 0000  INR 1.57    Sepsis Markers  Recent Labs Lab 08/27/16 0019  LATICACIDVEN 0.99    ABG  Recent Labs Lab 08/27/16 0238  PHART 7.343*  PCO2ART 43.1  PO2ART 109.0*    Liver Enzymes  Recent Labs Lab 08/27/16 0000  AST 28  ALT 16*  ALKPHOS 141*  BILITOT 1.0  ALBUMIN 2.4*    Cardiac Enzymes  Recent Labs Lab 08/27/16 0016  TROPONINI 0.03*    Glucose  Recent  Labs Lab 08/26/16 2355 08/27/16 0038 08/27/16 0233  GLUCAP 65 207* 106*    Imaging Dg Chest Portable 1 View  Result Date: 08/27/2016 CLINICAL DATA:  Central line placement EXAM: PORTABLE CHEST 1 VIEW COMPARISON:  08/27/2016 at 0059 hours FINDINGS: New right-sided IJ central line catheter is seen at the cavoatrial junction. No pneumothorax is noted. Heart is borderline enlarged. Patient is status post CABG and mitral valvular replacement. Endotracheal tube is 6 cm above the carina. Gastric tube extends below the left hemidiaphragm. Loculated pleural effusion is again seen along the periphery the right lung. Small left effusion is also present unchanged in appearance. IMPRESSION: 1. No pneumothorax after right IJ central line placement. The tip is seen at the cavoatrial junction. 2. Satisfactory support line and tube positions. 3. Bilateral pleural effusions right greater than left with loculation of fluid along the periphery of the right lung as before. Electronically Signed   By:  Tollie Ethavid  Kwon M.D.   On: 08/27/2016 02:00   Dg Chest Portable 1 View  Result Date: 08/27/2016 CLINICAL DATA:  Endotracheal and gastric tube placement EXAM: PORTABLE CHEST 1 VIEW COMPARISON:  None. FINDINGS: The tip of a gastric tube is seen coiled in the left upper quadrant of the abdomen in the expected location the stomach. An endotracheal tube is noted with tip approximately 5.1 cm above the carina. The patient is status post median sternotomy and CABG. Heart is top-normal. There is mild aortic atherosclerosis. Bilateral pleural effusions moderate to large on the right and small on the left are again seen without change. No acute nor suspicious osseous abnormality. IMPRESSION: Satisfactory support line and tube positions. Status post CABG. Right greater than left pleural effusions remain unchanged. Electronically Signed   By: Tollie Ethavid  Kwon M.D.   On: 08/27/2016 01:12   Dg Chest Portable 1 View  Result Date: 08/27/2016 CLINICAL DATA:  Suspected sepsis. Shortness of breath, weakness and cough today. EXAM: PORTABLE CHEST 1 VIEW COMPARISON:  Radiograph 08/04/2016 FINDINGS: Post median sternotomy with prosthetic presumed mitral valve. Cardiomediastinal contours are grossly stable, partially obscured by right-sided pulmonary opacity. Increased right pleural effusion with hazy opacity in the right mid lower lung zone and pleural fluid tracking about the lateral chest. Suspect small left pleural effusion. There is vascular congestion and probable perihilar edema. No pneumothorax. IMPRESSION: Reaccumulation right pleural effusion, moderate to large in size. Small left pleural effusion. Vascular congestion and probable pulmonary edema. Electronically Signed   By: Rubye OaksMelanie  Ehinger M.D.   On: 08/27/2016 00:21     STUDIES:  CXR 5/16 > pulmonary edema, b/l effusions R > L.  CULTURES: Blood 5/16 >  Urine 5/16 >  Sputum 5/16 >   ANTIBIOTICS: None.  SIGNIFICANT EVENTS: 5/16 > admit.  LINES/TUBES: ETT 5/16 >   R IJ CVL 5/16 >   DISCUSSION: 71 [facility], admitted 5/16 with AMS due to hypoglycemia.  Also with SOB and respiratory distress; therefore, intubated in ED.  ASSESSMENT / PLAN:  PULMONARY A: Acute hypoxic respiratory failure - s/p intubation in ED. Acute pulmonary edema - likely due to underlying heart failure. Bilateral pleural effusions R > L. P:   Full vent support. Wean as able. VAP prevention measures. SBT in AM if able. 40mg  lasix x 1. May need repeat thora. CXR in AM.  CARDIOVASCULAR A:  Troponin leak - suspect demand. Hypotension - ? Cardiogenic shock from CHF exacerbation / decompensated CHF. Hx PAF (not on anticoagulation), sCHF (Echo from April 2018 with  EF 35-40%, ) with probable exacerbation, CAD. P:  Trend troponins. Continue levophed as needed to maintain goal MAP > 65. Assess cortisol, CVP. Start stress steroids, d/c if cortisol > 20. Consider repeat echo. Continue preadmission aspirin, atorvastatin. Hold preadmission carvedilol, furosemide, imdur, nitro, colchicine.  RENAL A:   AoCKD. Hyponatremia - presumed hypervolemic. P:   KVO fluids for now. Assess osmoles. Correct electrolytes as indicated. BMP in AM.  GASTROINTESTINAL A:   GI prophylaxis. Nutrition. Hx cirrhosis, HCV. P:   SUP: Pantoprazole. NPO.  HEMATOLOGIC A:   Chronic anemia. VTE Prophylaxis. P:  Transfuse for Hgb < 7 SCD's / heparin. CBC in AM.  INFECTIOUS A:   No indication of infection. P:   Follow cultures as above. Assess PCT - if high, start empiric abx.  ENDOCRINE A:   IDDM, Hypothyroidism. P:   SSI. Continue preadmission synthroid, change to IV formulation. Hold preadmission lantus.  NEUROLOGIC A:   Acute encephalopathy. P:   Sedation:  Fentanyl PRN / Midazolam PRN. RASS goal: 0 to -1. Daily WUA. Assess UDS. Hold preadmission duloxetine, sertraline.  Family updated: None available.  Interdisciplinary Family Meeting v  Palliative Care Meeting:  Due by: 09/01/16.  CC time: 35 min.   Rutherford Guys, [place] [facility] Pulmonary & Critical Care Medicine Pager: [phone]  or [phone] 08/27/2016, 2:53 AM  Attending Note:  71 year old male with Hep C and cirrhosis presenting from rehab with a UTI, AMS and septic shock.  Patient was intubated in the ER.  On exam, lungs are clear but decreased on the right.  I reviewed CXR myself, right sided effusion noted.  Will maintain on pressors.  Continue full vent support.  Adjust vent for ABG.  Antibiotics broad spectrum for now as urine is clearly dirty.  Will order a CT of the chest to evaluate the extent of effusion.  May require a thora.  Titrate O2 for sat of 88-92%.  No family bedside to update.  The patient is critically ill with multiple organ systems failure and requires high complexity decision making for assessment and support, frequent evaluation and titration of therapies, application of advanced monitoring technologies and extensive interpretation of multiple databases.   Critical Care Time devoted to patient care services described in this note is  35  Minutes. This time reflects time of care of this signee Dr Koren Bound. This critical care time does not reflect procedure time, or teaching time or supervisory time of PA/NP/Med student/Med Resident etc but could involve care discussion time.  Alyson Reedy, M.D. [facility] Pulmonary/Critical Care Medicine. Pager: [phone]. After hours pager: [phone].

## 2016-08-27 NOTE — Progress Notes (Signed)
Pharmacy Antibiotic Note  Ronald Simpson is a 71 y.o. male admitted on 08/26/2016 with sepsis.  Pharmacy has been consulted for vancomycin and zosyn dosing. Concern over septic shock as patient requiring pressors.  -SCr 1.40 (baseline ~1.1) -WBC 10.9, afeb -PCT 11.06 (some AKI)  Plan: -Vanc 1750mg  x1, then 750mg  Q12H -Zosyn 3.375g Q8H -Monitor renal function, cultures, LOT -VT at steady state  Height: 5\' 8"  (172.7 cm) Weight: 169 lb 15.6 oz (77.1 kg) IBW/kg (Calculated) : 68.4  Temp (24hrs), Avg:98 F (36.7 C), Min:97.6 F (36.4 C), Max:98.7 F (37.1 C)   Recent Labs Lab 08/27/16 0000 08/27/16 0019 08/27/16 0411  WBC 9.6  --  10.9*  CREATININE 1.43*  --  1.40*  LATICACIDVEN  --  0.99  --     Estimated Creatinine Clearance: 48.9 mL/min (A) (by C-G formula based on SCr of 1.4 mg/dL (H)).    Allergies  Allergen Reactions  . Hydralazine     4/21-4/26/18 pleuritic chest pain with bilateral effusions and pericardial rub. Rule out hydralazine drug-induced lupus   . Percocet [Oxycodone-Acetaminophen]     Needing narcan on multiple occasions due to oversedation after getting percocet   . Tramadol     Excess sedation; ? Related to pre-existing hepatic dysfunction with PMH Hep C   Vanc 5/16>> Zosyn 5/16>>  5/16 MRSA PCR: pos 5/16 UCx: sent 5/16 trach aspirate: sent 5/16 BCx: sent   Thank you for allowing pharmacy to be a part of this patient's care.  Ronald KaufmanKai Avaiah Stempel Bernette Simpson(Ronald Simpson), PharmD  PGY1 Pharmacy Resident Pager: [phone] 08/27/2016 11:04 AM

## 2016-08-28 ENCOUNTER — Inpatient Hospital Stay (HOSPITAL_COMMUNITY): Payer: Medicare Other

## 2016-08-28 DIAGNOSIS — J9600 Acute respiratory failure, unspecified whether with hypoxia or hypercapnia: Secondary | ICD-10-CM

## 2016-08-28 LAB — BASIC METABOLIC PANEL
ANION GAP: 9 (ref 5–15)
BUN: 41 mg/dL — ABNORMAL HIGH (ref 6–20)
CALCIUM: 8.2 mg/dL — AB (ref 8.9–10.3)
CO2: 23 mmol/L (ref 22–32)
Chloride: 97 mmol/L — ABNORMAL LOW (ref 101–111)
Creatinine, Ser: 1.51 mg/dL — ABNORMAL HIGH (ref 0.61–1.24)
GFR calc Af Amer: 53 mL/min — ABNORMAL LOW (ref >60)
GFR calc non Af Amer: 46 mL/min — ABNORMAL LOW (ref >60)
GLUCOSE: 144 mg/dL — AB (ref 65–99)
POTASSIUM: 4 mmol/L (ref 3.5–5.1)
Sodium: 129 mmol/L — ABNORMAL LOW (ref 135–145)

## 2016-08-28 LAB — GLUCOSE, CAPILLARY
GLUCOSE-CAPILLARY: 122 mg/dL — AB (ref 65–99)
GLUCOSE-CAPILLARY: 145 mg/dL — AB (ref 65–99)
GLUCOSE-CAPILLARY: 151 mg/dL — AB (ref 65–99)
GLUCOSE-CAPILLARY: 156 mg/dL — AB (ref 65–99)
Glucose-Capillary: 115 mg/dL — ABNORMAL HIGH (ref 65–99)
Glucose-Capillary: 128 mg/dL — ABNORMAL HIGH (ref 65–99)
Glucose-Capillary: 134 mg/dL — ABNORMAL HIGH (ref 65–99)

## 2016-08-28 LAB — URINE CULTURE: Culture: 50000 — AB

## 2016-08-28 LAB — COOXEMETRY PANEL
Carboxyhemoglobin: 1 % (ref 0.5–1.5)
Methemoglobin: 0.9 % (ref 0.0–1.5)
O2 Saturation: 70.9 %
Total hemoglobin: 9.8 g/dL — ABNORMAL LOW (ref 12.0–16.0)

## 2016-08-28 LAB — CBC
HEMATOCRIT: 32.4 % — AB (ref 39.0–52.0)
HEMOGLOBIN: 10.1 g/dL — AB (ref 13.0–17.0)
MCH: 26.3 pg (ref 26.0–34.0)
MCHC: 31.2 g/dL (ref 30.0–36.0)
MCV: 84.4 fL (ref 78.0–100.0)
Platelets: 236 10*3/uL (ref 150–400)
RBC: 3.84 MIL/uL — ABNORMAL LOW (ref 4.22–5.81)
RDW: 19.2 % — AB (ref 11.5–15.5)
WBC: 10.9 10*3/uL — AB (ref 4.0–10.5)

## 2016-08-28 LAB — MAGNESIUM: Magnesium: 2.1 mg/dL (ref 1.7–2.4)

## 2016-08-28 LAB — PROCALCITONIN: PROCALCITONIN: 10.6 ng/mL

## 2016-08-28 LAB — PHOSPHORUS: Phosphorus: 3.8 mg/dL (ref 2.5–4.6)

## 2016-08-28 MED ORDER — CHLORHEXIDINE GLUCONATE CLOTH 2 % EX PADS
6.0000 | MEDICATED_PAD | Freq: Every day | CUTANEOUS | Status: DC
  Administered 2016-08-28: 6 via TOPICAL

## 2016-08-28 NOTE — Progress Notes (Signed)
PULMONARY / CRITICAL CARE MEDICINE   Name: Ronald Simpson Billups MRN: [id number] DOB: 1946/01/29    ADMISSION DATE:  08/26/2016 CONSULTATION DATE:  08/27/16  REFERRING MD:  Horton - EDP  CHIEF COMPLAINT:  SOB  HISTORY OF PRESENT ILLNESS:  Pt is encephelopathic; therefore, this HPI is obtained from chart review. Ronald Simpson Ide is a 71 y.o. male with PMH as outlined below and who resides at [facility].  [facility] with decreased level of consciousness along with hypoglycemia (CBG 30s initially) and SOB.  Upon arrival to ED, repeat CBG mid 60s and pt was noted to be in obvious respiratory distress. He was subsequently intubated and PCCM was called for admission.  CXR c/w pulmonary edema and bilateral pleural effusions.  Of note, he had admission 08/02/16 through 08/07/16.  During that admission, he had right sided thoracentesis by IR on 4/23 which was transudative and felt to be due to CHF and cirrhosis.  Following intubation, he remained hypotensive so had CVL placed and was started on levophed.  PAST MEDICAL HISTORY :  He  has a past medical history of CAD (coronary artery disease); Chronic anemia; Chronic combined systolic and diastolic CHF (congestive heart failure) (HCC); Cirrhosis (HCC); CKD (chronic kidney disease), stage III; CVA (cerebral infarction); DM (diabetes mellitus), type 2 with peripheral vascular complications (HCC); History of hepatitis C (1990s); Hypoalbuminemia; IDDM (insulin dependent diabetes mellitus) (HCC); Mitral regurgitation; PAF (paroxysmal atrial fibrillation) (HCC); Peripheral neuropathy; Peripheral vascular disease (HCC); and Proteinuria.  PAST SURGICAL HISTORY: He  has a past surgical history that includes Below knee leg amputation (Right); Sternotomy; Coronary artery bypass graft (2005); Amputation (Right, 01/07/2016); Cardiac catheterization (N/A, 01/11/2016); Amputation (Left, 04/04/2016); Esophagogastroduodenoscopy (N/A, 05/17/2016); ir generic  historical (07/07/2016); and IR THORACENTESIS ASP PLEURAL SPACE W/IMG GUIDE (08/04/2016).  Allergies  Allergen Reactions  . Hydralazine Other (See Comments)    4/21-4/26/18 pleuritic chest pain with bilateral effusions and pericardial rub. Rule out hydralazine drug-induced lupus   . Percocet [Oxycodone-Acetaminophen] Other (See Comments)    Needing narcan on multiple occasions due to oversedation after getting percocet   . Tramadol Other (See Comments)    Excess sedation; ? Related to pre-existing hepatic dysfunction with PMH Hep C    No current facility-administered medications on file prior to encounter.    Current Outpatient Prescriptions on File Prior to Encounter  Medication Sig  . Amino Acids-Protein Hydrolys (FEEDING SUPPLEMENT, PRO-STAT SUGAR FREE 64,) LIQD Take 30 mLs by mouth 2 (two) times daily.  Marland Kitchen. aspirin EC 81 MG EC tablet Take 1 tablet (81 mg total) by mouth daily.  Marland Kitchen. atorvastatin (LIPITOR) 20 MG tablet Take 20 mg by mouth daily.  . barrier cream (NON-SPECIFIED) CREA Apply 1 application topically 2 (two) times daily.  . carvedilol (COREG) 6.25 MG tablet Take 6.25 mg by mouth 2 (two) times daily with a meal.  . colchicine 0.6 MG tablet Take 1 tablet (0.6 mg total) by mouth 2 (two) times daily.  . DULoxetine (CYMBALTA) 30 MG capsule Take 30 mg by mouth daily.  . feeding supplement, ENSURE ENLIVE, (ENSURE ENLIVE) LIQD Take 237 mLs by mouth 2 (two) times daily between meals.  . ferrous sulfate 325 (65 FE) MG tablet Take 325 mg by mouth 2 (two) times daily with a meal.  . furosemide (LASIX) 40 MG tablet Take 40 mg by mouth daily.   . insulin glargine (LANTUS) 100 unit/mL SOPN Inject 17 Units into the skin at bedtime.  Marland Kitchen. ipratropium-albuterol (DUONEB) 0.5-2.5 (3)  MG/3ML SOLN Take 3 mLs by nebulization 3 (three) times daily. May also use via nebulizer every 6 hours as needed for shortness of breath  . isosorbide mononitrate (IMDUR) 30 MG 24 hr tablet Take 1 tablet (30 mg total) by  mouth daily.  Marland Kitchen levothyroxine (SYNTHROID, LEVOTHROID) 25 MCG tablet Take 25 mcg by mouth daily before breakfast.  . nitroGLYCERIN (NITROSTAT) 0.4 MG SL tablet Place 0.4 mg under the tongue every 5 (five) minutes as needed for chest pain.  . OXYGEN Inhale into the lungs. 2lpm via [place] prn for shortness of breath or O2 sat <90%  . pantoprazole (PROTONIX) 40 MG tablet Take 1 tablet (40 mg total) by mouth daily.  . sertraline (ZOLOFT) 25 MG tablet Take 25 mg by mouth daily.    FAMILY HISTORY:  His indicated that his mother is deceased. He indicated that his father is deceased. He indicated that his maternal grandmother is deceased. He indicated that his maternal grandfather is deceased. He indicated that his paternal grandmother is deceased. He indicated that his paternal grandfather is deceased. He indicated that the status of his neg hx is unknown. He indicated that the status of his other is unknown.    SOCIAL HISTORY: He  reports that he has been smoking Cigarettes.  He has a 5.40 pack-year smoking history. He has never used smokeless tobacco. He reports that he drinks alcohol. He reports that he uses drugs, including Cocaine and Marijuana.  REVIEW OF SYSTEMS:   Unable to obtain as pt is intubated  SUBJECTIVE:  More awake today AM. Doing well on weaning trial.  VITAL SIGNS: BP 113/77   Pulse 79   Temp 97 F (36.1 C) (Oral)   Resp 12   Ht 5\' 8"  (1.727 m)   Wt 165 lb 5.5 oz (75 kg)   SpO2 100%   BMI 25.14 kg/m   HEMODYNAMICS: CVP:  [7 mmHg-17 mmHg] 8 mmHg  VENTILATOR SETTINGS: Vent Mode: PSV;CPAP FiO2 (%):  [40 %] 40 % Set Rate:  [16 bmp] 16 bmp Vt Set:  [540 mL] 540 mL PEEP:  [5 cmH20] 5 cmH20 Pressure Support:  [5 cmH20] 5 cmH20 Plateau Pressure:  [18 cmH20-21 cmH20] 18 cmH20  INTAKE / OUTPUT: I/O last 3 completed shifts: In: 2271.8 [I.V.:371.8; IV Piggyback:1900] Out: 1670 [Urine:1570; Emesis/NG output:100]   PHYSICAL EXAMINATION:  Gen:      No acute distress HEENT:   EOMI, sclera anicteric, ETT in palce Neck:     No masses; no thyromegaly Lungs:    Clear to auscultation bilaterally; normal respiratory effort CV:         Regular rate and rhythm; no murmurs Abd:      + bowel sounds; soft, non-tender; no palpable masses, no distension Ext:    No edema; adequate peripheral perfusion, Rt AKA, lt foot in dressings Skin:      Warm and dry; no rash Neuro: No focal deficits   LABS:  BMET  Recent Labs Lab 08/27/16 0000 08/27/16 0411 08/28/16 0500  NA 128* 128* 129*  K 3.8 3.8 4.0  CL 95* 97* 97*  CO2 25 23 23   BUN 41* 40* 41*  CREATININE 1.43* 1.40* 1.51*  GLUCOSE 61* 80 144*    Electrolytes  Recent Labs Lab 08/27/16 0000 08/27/16 0411 08/28/16 0500  CALCIUM 8.3* 8.0* 8.2*  MG  --  1.9 2.1  PHOS  --  3.3 3.8    CBC  Recent Labs Lab 08/27/16 0000 08/27/16 0411 08/28/16 0500  WBC  9.6 10.9* 10.9*  HGB 9.7* 9.6* 10.1*  HCT 31.3* 30.8* 32.4*  PLT 268 273 236    Coag's  Recent Labs Lab 08/27/16 0000  INR 1.57    Sepsis Markers  Recent Labs Lab 08/27/16 0019 08/27/16 0410 08/28/16 0500  LATICACIDVEN 0.99  --   --   PROCALCITON  --  11.06 10.60    ABG  Recent Labs Lab 08/27/16 0238 08/27/16 0430  PHART 7.343* 7.414  PCO2ART 43.1 35.2  PO2ART 109.0* 117*    Liver Enzymes  Recent Labs Lab 08/27/16 0000  AST 28  ALT 16*  ALKPHOS 141*  BILITOT 1.0  ALBUMIN 2.4*    Cardiac Enzymes  Recent Labs Lab 08/27/16 0016 08/27/16 0411 08/27/16 1120  TROPONINI 0.03* 0.07* 0.06*    Glucose  Recent Labs Lab 08/27/16 1636 08/27/16 2019 08/27/16 2208 08/27/16 2350 08/28/16 0314 08/28/16 0825  GLUCAP 102* 115* 115* 122* 145* 128*    Imaging Ct Chest Wo Contrast  Result Date: 08/27/2016 CLINICAL DATA:  Lung mass. EXAM: CT CHEST WITHOUT CONTRAST TECHNIQUE: Multidetector CT imaging of the chest was performed following the standard protocol without IV contrast. COMPARISON:  08/02/2016 FINDINGS:  Cardiovascular: Cardiomegaly without pericardial effusion. Diffuse atherosclerotic calcification, status post CABG. Mitral valve annuloplasty. Mediastinum/Nodes: Atherosclerotic calcification. No acute finding. Right IJ central line with tip at the upper cavoatrial junction. An orogastric tube reaches the stomach. Lungs/Pleura: Large layering right pleural effusion with multi segment atelectasis, essentially stable from 08/02/2016. The lower lobe is completely collapsed. The aerated lung shows no pulmonary edema. Smaller layering left pleural effusion, also with atelectasis. The left lung is mildly scalloped, also seen previously, suggesting some pleural fluid complexity. Posterior segment left upper lobe consolidative and ground-glass densities that are new from prior and suspicious for infection. Upper Abdomen: No acute finding.  History of cirrhosis. Musculoskeletal: No acute or aggressive finding. IMPRESSION: 1. Large layering right pleural effusion with lower lobe collapse, essentially stable compared to 08/02/2016 chest CT. 2. Small layering left pleural effusion with atelectasis. Left upper lobe pneumonia or pneumonitis that is new from 08/02/2016. 3. Tubes and central line in good position. Electronically Signed   By: Marnee Spring M.D.   On: 08/27/2016 14:31   Dg Chest Port 1 View  Result Date: 08/28/2016 CLINICAL DATA:  Intubated patient, respiratory failure EXAM: PORTABLE CHEST 1 VIEW COMPARISON:  CT scan of the chest and portable chest x-ray of Aug 27, 2016 FINDINGS: The left lung is well-expanded. On the right there is persistent increased density in the mid and lower lung. The right hemidiaphragm is obscured. There is a small left pleural effusion. There is no pneumothorax. The heart remains mildly enlarged. A prosthetic mitral valve ring is visible. The pulmonary vascularity is normal. The endotracheal tube tip lies 5.2 cm above the carina. The esophagogastric tube tip in proximal port project  below the GE junction. The right internal jugular venous catheter tip projects over the midportion of the SVC. IMPRESSION: Large right and smaller left pleural effusion, stable. Mild cardiomegaly without pulmonary vascular congestion. The support tubes are in reasonable position. Thoracic aortic atherosclerosis. Electronically Signed   By: David  [place] M.D.   On: 08/28/2016 07:41   Dg Chest Port 1 View  Result Date: 08/27/2016 CLINICAL DATA:  Endotracheal tube EXAM: PORTABLE CHEST 1 VIEW COMPARISON:  Earlier today FINDINGS: Endotracheal tube tip 16 mm above the carina. An orogastric tube is coiled in the stomach. Right IJ catheter with tip at the upper cavoatrial junction.  Inferior flow of at least moderate pleural effusion on the right. Stable postoperative heart size. Much of the right lung is obscured. No suspected pulmonary edema. No visible pneumothorax. IMPRESSION: 1. Advanced endotracheal tube, tip 16 mm above the carina. 2. Pleural effusion on the right with inferior flow compared to earlier today. Electronically Signed   By: Marnee Spring M.D.   On: 08/27/2016 10:31    STUDIES:  CXR 5/16 > pulmonary edema, b/l effusions R > L. CT chest 5/16 > new left lung consolidation with small left pleural effusion. Moderate to large right pleural effusion with compressive atelectasis. I reviewed all images personally  CULTURES: Blood 5/16 >  Urine 5/16 >  Sputum 5/16 >   ANTIBIOTICS: Vancomycin 5/16 > Zosyn 5/16 >  SIGNIFICANT EVENTS: 5/16 > admit.  LINES/TUBES: ETT 5/16 >  R IJ CVL 5/16 >   DISCUSSION: 71 y.o. male from [facility], admitted 5/16 with AMS due to hypoglycemia.  Also with SOB and respiratory distress; therefore, intubated in ED. Likely septic from left lung pneumonia, UTI.   ASSESSMENT / PLAN:  PULMONARY A: Acute hypoxic respiratory failure - s/p intubation in ED. Acute pulmonary edema - likely due to underlying heart failure. Bilateral pleural effusions R  > L. P:   Plan for extubation today Will likely need rt thoracentesis when off positive pressure ventialtion Continue abx  CARDIOVASCULAR A:  Troponin leak - suspect demand. Hypotension - ? Cardiogenic shock from CHF exacerbation / decompensated CHF. Hx PAF (not on anticoagulation), sCHF (Echo from April 2018 with EF 35-40%, ) with probable exacerbation, CAD. P:  Off levophed D/C stress dose steroids Continue diuresis Continue preadmission aspirin, atorvastatin. Hold preadmission carvedilol, furosemide, imdur, nitro, colchicine.  RENAL A:   AoCKD. Hyponatremia - presumed hypervolemic. P:   Follow urine output and Cr.  GASTROINTESTINAL A:   GI prophylaxis. Nutrition. Hx cirrhosis, HCV. P:   Protonix for SUP Keep NPO  HEMATOLOGIC A:   Chronic anemia. VTE Prophylaxis. P:  Transfuse for Hb < 7 SCD's / heparin. CBC in AM.  INFECTIOUS A:   Lt pneumonia UTI P:   Continue vanco, zosyn. Follow Pct and cultures  ENDOCRINE A:   IDDM, Hypothyroidism. P:   SSI IV synthyroid Hold preadmission lantus.  NEUROLOGIC A:   Acute encephalopathy. P:   Sedation:  Hold sedation in anticipation of extubation Hold preadmission duloxetine, sertraline.  Family updated: Mom updated at bedside  Interdisciplinary Family Meeting v Palliative Care Meeting:  Due by: 09/01/16.  The patient is critically ill with multiple organ system failure and requires high complexity decision making for assessment and support, frequent evaluation and titration of therapies, advanced monitoring, review of radiographic studies and interpretation of complex data.   Critical Care Time devoted to patient care services, exclusive of separately billable procedures, described in this note is 35 minutes.   Chilton Greathouse MD [facility] Pager [phone] If no answer or after 3pm call: [phone] 08/28/2016, 10:16 AM
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
## 2016-08-28 NOTE — Progress Notes (Signed)
eLink Physician-Brief Progress Note Patient Name: Ronald Simpson DOB: July 12, 1945 MRN: [id number]   Date of Service  08/28/2016  HPI/Events of Note  Request for diet - patient extubated earlier today. Able to swallow sips of water without difficulty.  eICU Interventions  Will advance diet to carb modified clear liquid diet.         Sommer,Steven Dennard Nipugene 08/28/2016, 5:13 PM

## 2016-08-28 NOTE — Procedures (Signed)
Extubation Procedure Note  Patient Details:   Name: Ronald Simpson DOB: 18-Nov-1945 MRN: [id number]   Airway Documentation:  Airway (Active)  Secured at (cm) 26 cm 08/28/2016  8:24 AM  Measured From Lips 08/28/2016  8:24 AM  Secured Location Right 08/28/2016  8:24 AM  Secured By [employer] Tube Holder 08/28/2016  8:24 AM  Tube Holder Repositioned Yes 08/28/2016  8:24 AM  Site Condition Dry 08/28/2016  8:24 AM    Evaluation  O2 sats: stable throughout Complications: No apparent complications Patient did tolerate procedure well. Bilateral Breath Sounds: Clear, Diminished   Yes  4l/min Cook  Incentive spirometer instructed, 900ml  Newt LukesGroendal, Zahriyah Joo Ann 08/28/2016, 10:28 AM

## 2016-08-29 ENCOUNTER — Inpatient Hospital Stay (HOSPITAL_COMMUNITY): Payer: Medicare Other

## 2016-08-29 ENCOUNTER — Encounter (HOSPITAL_COMMUNITY): Payer: Self-pay | Admitting: *Deleted

## 2016-08-29 LAB — BASIC METABOLIC PANEL
Anion gap: 10 (ref 5–15)
BUN: 42 mg/dL — ABNORMAL HIGH (ref 6–20)
CALCIUM: 8.6 mg/dL — AB (ref 8.9–10.3)
CHLORIDE: 96 mmol/L — AB (ref 101–111)
CO2: 25 mmol/L (ref 22–32)
Creatinine, Ser: 1.53 mg/dL — ABNORMAL HIGH (ref 0.61–1.24)
GFR calc Af Amer: 52 mL/min — ABNORMAL LOW (ref >60)
GFR calc non Af Amer: 45 mL/min — ABNORMAL LOW (ref >60)
GLUCOSE: 121 mg/dL — AB (ref 65–99)
POTASSIUM: 4 mmol/L (ref 3.5–5.1)
Sodium: 131 mmol/L — ABNORMAL LOW (ref 135–145)

## 2016-08-29 LAB — CBC
HEMATOCRIT: 32.9 % — AB (ref 39.0–52.0)
Hemoglobin: 10.2 g/dL — ABNORMAL LOW (ref 13.0–17.0)
MCH: 26.2 pg (ref 26.0–34.0)
MCHC: 31 g/dL (ref 30.0–36.0)
MCV: 84.4 fL (ref 78.0–100.0)
Platelets: 256 10*3/uL (ref 150–400)
RBC: 3.9 MIL/uL — ABNORMAL LOW (ref 4.22–5.81)
RDW: 19.3 % — AB (ref 11.5–15.5)
WBC: 16.3 10*3/uL — AB (ref 4.0–10.5)

## 2016-08-29 LAB — VANCOMYCIN, TROUGH: VANCOMYCIN TR: 33 ug/mL — AB (ref 15–20)

## 2016-08-29 LAB — LACTATE DEHYDROGENASE: LDH: 195 U/L — ABNORMAL HIGH (ref 98–192)

## 2016-08-29 LAB — BODY FLUID CELL COUNT WITH DIFFERENTIAL
Eos, Fluid: 0 %
Lymphs, Fluid: 5 %
Monocyte-Macrophage-Serous Fluid: 55 % (ref 50–90)
Neutrophil Count, Fluid: 40 % — ABNORMAL HIGH (ref 0–25)
Total Nucleated Cell Count, Fluid: 450 uL (ref 0–1000)

## 2016-08-29 LAB — GRAM STAIN

## 2016-08-29 LAB — COOXEMETRY PANEL
Carboxyhemoglobin: 1.6 % — ABNORMAL HIGH (ref 0.5–1.5)
Methemoglobin: 0.9 % (ref 0.0–1.5)
O2 Saturation: 82.9 %
TOTAL HEMOGLOBIN: 10.8 g/dL — AB (ref 12.0–16.0)

## 2016-08-29 LAB — PROCALCITONIN: Procalcitonin: 6.93 ng/mL

## 2016-08-29 LAB — GLUCOSE, CAPILLARY
GLUCOSE-CAPILLARY: 128 mg/dL — AB (ref 65–99)
GLUCOSE-CAPILLARY: 151 mg/dL — AB (ref 65–99)
GLUCOSE-CAPILLARY: 162 mg/dL — AB (ref 65–99)
Glucose-Capillary: 149 mg/dL — ABNORMAL HIGH (ref 65–99)
Glucose-Capillary: 180 mg/dL — ABNORMAL HIGH (ref 65–99)
Glucose-Capillary: 140 mg/dL — ABNORMAL HIGH (ref 65–99)

## 2016-08-29 LAB — MAGNESIUM: Magnesium: 2 mg/dL (ref 1.7–2.4)

## 2016-08-29 LAB — PHOSPHORUS: Phosphorus: 3.5 mg/dL (ref 2.5–4.6)

## 2016-08-29 LAB — LACTATE DEHYDROGENASE, PLEURAL OR PERITONEAL FLUID: LD, Fluid: 122 U/L — ABNORMAL HIGH (ref 3–23)

## 2016-08-29 LAB — PROTEIN, PLEURAL OR PERITONEAL FLUID: Total protein, fluid: 3.5 g/dL

## 2016-08-29 LAB — PROTEIN, TOTAL: Total Protein: 6.8 g/dL (ref 6.5–8.1)

## 2016-08-29 MED ORDER — FUROSEMIDE 10 MG/ML IJ SOLN
60.0000 mg | Freq: Two times a day (BID) | INTRAMUSCULAR | Status: DC
  Administered 2016-08-29 – 2016-08-31 (×6): 60 mg via INTRAVENOUS
  Filled 2016-08-29 (×6): qty 6

## 2016-08-29 MED ORDER — SERTRALINE HCL 25 MG PO TABS
25.0000 mg | ORAL_TABLET | Freq: Every day | ORAL | Status: DC
  Administered 2016-08-29 – 2016-09-06 (×9): 25 mg via ORAL
  Filled 2016-08-29 (×9): qty 1

## 2016-08-29 MED ORDER — PANTOPRAZOLE SODIUM 40 MG PO TBEC
40.0000 mg | DELAYED_RELEASE_TABLET | Freq: Every day | ORAL | Status: DC
  Administered 2016-08-29 – 2016-09-05 (×8): 40 mg via ORAL
  Filled 2016-08-29 (×9): qty 1

## 2016-08-29 MED ORDER — VANCOMYCIN HCL IN DEXTROSE 1-5 GM/200ML-% IV SOLN
1000.0000 mg | INTRAVENOUS | Status: DC
  Administered 2016-08-30: 1000 mg via INTRAVENOUS
  Filled 2016-08-29 (×2): qty 200

## 2016-08-29 MED ORDER — COLLAGENASE 250 UNIT/GM EX OINT
TOPICAL_OINTMENT | Freq: Every day | CUTANEOUS | Status: DC
  Administered 2016-08-29 – 2016-09-02 (×5): via TOPICAL
  Administered 2016-09-03: 1 via TOPICAL
  Administered 2016-09-04: 09:00:00 via TOPICAL
  Administered 2016-09-05: 1 via TOPICAL
  Administered 2016-09-06: 10:00:00 via TOPICAL
  Filled 2016-08-29: qty 30

## 2016-08-29 MED ORDER — FLUCONAZOLE 100 MG PO TABS
100.0000 mg | ORAL_TABLET | Freq: Every day | ORAL | Status: AC
  Administered 2016-08-29 – 2016-09-02 (×5): 100 mg via ORAL
  Filled 2016-08-29 (×5): qty 1

## 2016-08-29 MED ORDER — VANCOMYCIN HCL IN DEXTROSE 1-5 GM/200ML-% IV SOLN: 1000.0000 mg | INTRAVENOUS | Status: DC

## 2016-08-29 MED ORDER — LEVOTHYROXINE SODIUM 25 MCG PO TABS
25.0000 ug | ORAL_TABLET | Freq: Every day | ORAL | Status: DC
  Administered 2016-08-30 – 2016-09-06 (×8): 25 ug via ORAL
  Filled 2016-08-29 (×8): qty 1

## 2016-08-29 MED ORDER — CARVEDILOL 3.125 MG PO TABS
3.1250 mg | ORAL_TABLET | Freq: Two times a day (BID) | ORAL | Status: DC
  Administered 2016-08-29 – 2016-09-04 (×14): 3.125 mg via ORAL
  Filled 2016-08-29 (×15): qty 1

## 2016-08-29 NOTE — Consult Note (Signed)
WOC Nurse wound consult note Reason for Consult: left foot wound Wound type: full thickness surgical wound from amputation Pressure Injury POA: na Measurement: 2.75cm x 0.75cm x 0.2cm Wound bed: 20% pale pink, 80% dried yellow eschar Drainage (amount, consistency, odor) scant Periwound: intact healed scar tissue Dressing procedure/placement/frequency: I have provided nurses with orders for Cleansing with NS, pat dry, apply Santyl to wound bed, cover with NS moistened gauze, cover with dry gauze, perform daily. We will not follow, but will remain available to this patient, to nursing, and the medical and/or surgical teams.  Please re-consult if we need to assist further.     Barnett HatterMelinda Nahom Carfagno, RN-C, WTA-C Wound Treatment Associate

## 2016-08-29 NOTE — Progress Notes (Signed)
RT instructed pt on the use of flutter valve and incentive spirometer.  Pt is weak following the thoracentesis but was able to use both with coaching.
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
## 2016-08-29 NOTE — Procedures (Signed)
Thoracentesis Procedure Note  Pre-operative Diagnosis: Rt pleural effusion  Post-operative Diagnosis: same  Indications: Resp failure  Procedure Details  Consent: Informed consent was obtained. Risks of the procedure were discussed including: infection, bleeding, pain, pneumothorax.  Under sterile conditions the patient was positioned. Betadine solution and sterile drapes were utilized.  2% buffered lidocaine was used to anesthetize the rib space. Fluid was obtained without any difficulties and minimal blood loss.  A dressing was applied to the wound and wound care instructions were provided.   Findings 1200 ml of clear, straw colored pleural fluid was obtained. A sample was sent to Pathology for cytogenetics, flow, and cell counts, as well as for infection analysis.  Complications:  None; patient tolerated the procedure well.          Condition: stable  Plan A follow up chest x-ray was ordered. Bed Rest for 2 hours.   Chilton GreathousePraveen Laurel Smeltz MD [facility] Pager [phone] If no answer or after 3pm call: [phone] 08/29/2016, 11:29 AM

## 2016-08-29 NOTE — Progress Notes (Addendum)
Pharmacy Antibiotic Note  [name] Momon is a 71 y.o. male admitted on 08/26/2016 with sepsis.  Pharmacy has been consulted for vancomycin and zosyn dosing. Patient is now doing better and no longer requiring pressors. Cultures are still negative. CCM would like to continue vanc/zosyn at least through 5/19, but may de-escalate at that point if patient continues to improve and cultures remain negative.  5/18 VT=33. Estimated half life = 22.5 hours.   -SCr 1.40>1.51>1.53 (baseline ~1.1) -WBC 10.9>>16.3, afeb -PCT 11.06>10.6>6.93 (some AKI)  Plan: -Change vancomycin to 1000mg  Q24H starting at 0800 tomorrow -Zosyn 3.375g Q8H -Monitor renal function, cultures, LOT -VT at steady state if continued  Height: 5\' 8"  (172.7 cm) Weight: 165 lb 12.6 oz (75.2 kg) IBW/kg (Calculated) : 68.4  Temp (24hrs), Avg:98 F (36.7 C), Min:97.5 F (36.4 C), Max:98.4 F (36.9 C)   Recent Labs Lab 08/27/16 0000 08/27/16 0019 08/27/16 0411 08/28/16 0500 08/29/16 0455 08/29/16 0941  WBC 9.6  --  10.9* 10.9* 16.3*  --   CREATININE 1.43*  --  1.40* 1.51* 1.53*  --   LATICACIDVEN  --  0.99  --   --   --   --   VANCOTROUGH  --   --   --   --   --  33*    Estimated Creatinine Clearance: 44.7 mL/min (A) (by C-G formula based on SCr of 1.53 mg/dL (H)).    Allergies  Allergen Reactions  . Hydralazine Other (See Comments)    4/21-4/26/18 pleuritic chest pain with bilateral effusions and pericardial rub. Rule out hydralazine drug-induced lupus   . Percocet [Oxycodone-Acetaminophen] Other (See Comments)    Needing narcan on multiple occasions due to oversedation after getting percocet   . Tramadol Other (See Comments)    Excess sedation; ? Related to pre-existing hepatic dysfunction with PMH Hep C   Vanc 5/16>> Zosyn 5/16>>  5/16 MRSA PCR: pos 5/16 UCx: 50,000 colonies yeast 5/16 trach aspirate: sent 5/16 BCx: ngtd  Thank you for allowing pharmacy to be a part of this patient's care.  Gwyndolyn KaufmanKai Shlomo Seres  [name](Kenny), PharmD  PGY1 Pharmacy Resident Pager: [phone] 08/29/2016 10:38 AM

## 2016-08-29 NOTE — Progress Notes (Signed)
PULMONARY / CRITICAL CARE MEDICINE   Name: Ronald Simpson MRN: [id number] DOB: 09/20/1945    ADMISSION DATE:  08/26/2016 CONSULTATION DATE:  08/27/16  REFERRING MD:  Horton - EDP  CHIEF COMPLAINT:  SOB  HISTORY OF PRESENT ILLNESS:  Pt is encephelopathic; therefore, this HPI is obtained from chart review. Ronald Simpson is a 71 y.o. male with PMH as outlined below and who resides at [facility].  He was brought to [facility] ED 5/15 with decreased level of consciousness along with hypoglycemia (CBG 30s initially) and SOB.  Upon arrival to ED, repeat CBG mid 60s and pt was noted to be in obvious respiratory distress. He was subsequently intubated and PCCM was called for admission.  CXR c/w pulmonary edema and bilateral pleural effusions.  Of note, he had admission 08/02/16 through 08/07/16.  During that admission, he had right sided thoracentesis by IR on 4/23 which was transudative and felt to be due to CHF and cirrhosis.  Following intubation, he remained hypotensive so had CVL placed and was started on levophed.  SUBJECTIVE:  Awake and alert today. He is complaining of foot pain. States he is " rough" overall, but that his breathing is better.  VITAL SIGNS: BP 115/77   Pulse 96   Temp 97.7 F (36.5 C) (Oral)   Resp (!) 22   Ht 5\' 8"  (1.727 m)   Wt 165 lb 12.6 oz (75.2 kg)   SpO2 96%   BMI 25.21 kg/m   HEMODYNAMICS: CVP:  [6 mmHg-17 mmHg] 13 mmHg  VENTILATOR SETTINGS:    INTAKE / OUTPUT: I/O last 3 completed shifts: In: 1070 [P.O.:120; I.V.:320; NG/GT:30; IV Piggyback:600] Out: 1610 [Urine:1610]   PHYSICAL EXAMINATION:  Gen:      Awake and alert, male supine in bed. HEENT:  Normocephalic, atraumatic Lungs:    Exp. Wheezed noted bilaterally, diminished per bases, R>L CV:         RRR, S1, S2, No RMG Abd:      Non-tender, non-distended, BS + Ext:    No obvious deformities, R AKA, L foot with dressing, CDI, Brisk refill per nailbed Skin:      Warm, dry and intact, no  rash, lesions noted, tattoo Neuro: Awake alert and appropriate, no focal deficits   LABS:  BMET  Recent Labs Lab 08/27/16 0411 08/28/16 0500 08/29/16 0455  NA 128* 129* 131*  K 3.8 4.0 4.0  CL 97* 97* 96*  CO2 23 23 25   BUN 40* 41* 42*  CREATININE 1.40* 1.51* 1.53*  GLUCOSE 80 144* 121*    Electrolytes  Recent Labs Lab 08/27/16 0411 08/28/16 0500 08/29/16 0455  CALCIUM 8.0* 8.2* 8.6*  MG 1.9 2.1 2.0  PHOS 3.3 3.8 3.5    CBC  Recent Labs Lab 08/27/16 0411 08/28/16 0500 08/29/16 0455  WBC 10.9* 10.9* 16.3*  HGB 9.6* 10.1* 10.2*  HCT 30.8* 32.4* 32.9*  PLT 273 236 256    Coag's  Recent Labs Lab 08/27/16 0000  INR 1.57    Sepsis Markers  Recent Labs Lab 08/27/16 0019 08/27/16 0410 08/28/16 0500 08/29/16 0455  LATICACIDVEN 0.99  --   --   --   PROCALCITON  --  11.06 10.60 6.93    ABG  Recent Labs Lab 08/27/16 0238 08/27/16 0430  PHART 7.343* 7.414  PCO2ART 43.1 35.2  PO2ART 109.0* 117*    Liver Enzymes  Recent Labs Lab 08/27/16 0000  AST 28  ALT 16*  ALKPHOS 141*  BILITOT 1.0  ALBUMIN 2.4*  Cardiac Enzymes  Recent Labs Lab 08/27/16 0016 08/27/16 0411 08/27/16 1120  TROPONINI 0.03* 0.07* 0.06*    Glucose  Recent Labs Lab 08/28/16 1227 08/28/16 1637 08/28/16 2023 08/28/16 2347 08/29/16 0337 08/29/16 0756  GLUCAP 156* 151* 134* 162* 128* 151*    Imaging Dg Chest Port 1 View  Result Date: 08/29/2016 CLINICAL DATA:  Shortness of breath.  Acute respiratory failure. EXAM: PORTABLE CHEST 1 VIEW COMPARISON:  Chest x-ray 08/28/2016 08/27/2016. CT chest 08/27/2016 . FINDINGS: Interim extubation removal of NG tube . Right IJ line in stable position. Prior CABG and cardiac valve replacement. Stable cardiomegaly. Persistent bilateral atelectatic changes. Persistent bilateral pleural effusions, right side greater than left. Chest is unchanged from prior exam. No pneumothorax . IMPRESSION: 1. Interim extubation and  removal of NG tube. Right IJ line stable position. 2. Persistent bilateral atelectatic changes with bilateral pleural effusions, right side worse than left. No significant change from prior exam. 3. Prior CABG. Prior cardiac valve replacement. Stable cardiomegaly . Electronically Signed   By: Maisie Fushomas  Register   On: 08/29/2016 07:56    STUDIES:  CXR 5/16 > pulmonary edema, b/l effusions R > L. CT chest 5/16 > new left lung consolidation with small left pleural effusion. Moderate to large right pleural effusion with compressive atelectasis. CXR 5/18> Persistent bilateral atelectatic changes with bilateral pleural effusions, right side worse than left. No significant change from prior exam  CULTURES: Blood 5/16 >  Urine 5/16 > 50,000 colonies yeast Sputum 5/16 >   ANTIBIOTICS: Vancomycin 5/16 > Zosyn 5/16 > Diflucan 5/18>   SIGNIFICANT EVENTS: 5/16 > admit.  LINES/TUBES: ETT 5/16 >  R IJ CVL 5/16 >   DISCUSSION: 71 [facility], admitted 5/16 with AMS due to hypoglycemia.  Also with SOB and respiratory distress; therefore, intubated in ED. Likely septic from left lung pneumonia, UTI. He was extubated 5/17, and is stable on 3 L .He is no longer pressor dependent.  ASSESSMENT / PLAN:  PULMONARY A: Acute hypoxic respiratory failure - s/p intubation in ED. Acute pulmonary edema - likely due to underlying heart failure. Bilateral pleural effusions R > L. ( Transudate) P:   Stable post extubations x 24 hours Persistent bilateral transudate pleural effusions, R>L Consider repeat thoracentesis Continue abx Aggressive Pulmonary Toilet IS, Flutter valve Mobilize as able Desaturations at night Will need pulmonary follow up for OSA eval/ need for nocturnal oxygen  CARDIOVASCULAR A:  Troponin leak - suspect demand. Hypotension - ? Cardiogenic shock from CHF exacerbation / decompensated CHF. Hx PAF (not on anticoagulation), sCHF (Echo from April 2018 with EF  35-40%, ) with probable exacerbation, CAD. Currently in NSR 5/18 P:  Off levophed ( CVP 13) D/C stress dose steroids BNP remains elevated Continue aggressive diuresis( Cards increased lasix dosing 5/18) Continue preadmission aspirin, atorvastatin. Hold preadmission carvedilol, furosemide, imdur, nitro, colchicine.  RENAL A:   AoCKD. Hyponatremia - presumed hypervolemic. P:   Follow urine output and Cr. Replete electrolytes as needed Avoid nephrotoxic medications Ensure renal perfusion  GASTROINTESTINAL A:   GI prophylaxis. Nutrition. Hx cirrhosis, HCV. P:   Protonix for SUP Advanced to carb modified clears Consider swallow eval  HEMATOLOGIC A:   Chronic anemia. VTE Prophylaxis. P:  Transfuse for Hb < 7 SCD's / heparin. CBC in AM.  INFECTIOUS A:   Lt pneumonia UTI >> 50,000 colonies yeast L Foot surgical wound PCT down trending  P:   Continue vanco, zosyn. Follow Pct and cultures Will add diflucan for  yeast in urine  ENDOCRINE A:   IDDM, Hypothyroidism. P: CBG q 4   SSI IV synthyroid Hold preadmission lantus.  NEUROLOGIC A:   Acute encephalopathy.>> Resolved Awake alert and appropriate P:   Monitor mental status Continue to hold preadmission duloxetine, sertraline.  Family updated: No family at bedside, patient updated on plan of care  Interdisciplinary Family Meeting v Palliative Care Meeting:  Due by: 09/01/16.   Bevelyn Ngo, AGACNP-BC [facility] Pulmonary/Critical Care Medicine Pager # [phone] If no answer or after 3pm call: [phone] 08/29/2016, 8:57 AM

## 2016-08-30 ENCOUNTER — Inpatient Hospital Stay (HOSPITAL_COMMUNITY): Payer: Medicare Other

## 2016-08-30 DIAGNOSIS — Z9889 Other specified postprocedural states: Secondary | ICD-10-CM

## 2016-08-30 LAB — GLUCOSE, CAPILLARY
GLUCOSE-CAPILLARY: 136 mg/dL — AB (ref 65–99)
GLUCOSE-CAPILLARY: 146 mg/dL — AB (ref 65–99)
GLUCOSE-CAPILLARY: 187 mg/dL — AB (ref 65–99)
GLUCOSE-CAPILLARY: 197 mg/dL — AB (ref 65–99)
Glucose-Capillary: 118 mg/dL — ABNORMAL HIGH (ref 65–99)
Glucose-Capillary: 123 mg/dL — ABNORMAL HIGH (ref 65–99)
Glucose-Capillary: 196 mg/dL — ABNORMAL HIGH (ref 65–99)

## 2016-08-30 LAB — BASIC METABOLIC PANEL
ANION GAP: 10 (ref 5–15)
BUN: 47 mg/dL — ABNORMAL HIGH (ref 6–20)
CHLORIDE: 96 mmol/L — AB (ref 101–111)
CO2: 24 mmol/L (ref 22–32)
CREATININE: 1.77 mg/dL — AB (ref 0.61–1.24)
Calcium: 8.3 mg/dL — ABNORMAL LOW (ref 8.9–10.3)
GFR calc non Af Amer: 38 mL/min — ABNORMAL LOW (ref >60)
GFR, EST AFRICAN AMERICAN: 44 mL/min — AB (ref >60)
Glucose, Bld: 142 mg/dL — ABNORMAL HIGH (ref 65–99)
Potassium: 3.8 mmol/L (ref 3.5–5.1)
SODIUM: 130 mmol/L — AB (ref 135–145)

## 2016-08-30 LAB — COOXEMETRY PANEL
Carboxyhemoglobin: 1.2 % (ref 0.5–1.5)
Methemoglobin: 0.9 % (ref 0.0–1.5)
O2 SAT: 45.7 %
Total hemoglobin: 10.3 g/dL — ABNORMAL LOW (ref 12.0–16.0)

## 2016-08-30 LAB — CBC
HCT: 32 % — ABNORMAL LOW (ref 39.0–52.0)
Hemoglobin: 10 g/dL — ABNORMAL LOW (ref 13.0–17.0)
MCH: 26.4 pg (ref 26.0–34.0)
MCHC: 31.3 g/dL (ref 30.0–36.0)
MCV: 84.4 fL (ref 78.0–100.0)
PLATELETS: 212 10*3/uL (ref 150–400)
RBC: 3.79 MIL/uL — ABNORMAL LOW (ref 4.22–5.81)
RDW: 19.4 % — AB (ref 11.5–15.5)
WBC: 11.8 10*3/uL — AB (ref 4.0–10.5)

## 2016-08-30 MED ORDER — INSULIN ASPART 100 UNIT/ML ~~LOC~~ SOLN
0.0000 [IU] | Freq: Three times a day (TID) | SUBCUTANEOUS | Status: DC
  Administered 2016-08-31 – 2016-09-01 (×5): 2 [IU] via SUBCUTANEOUS
  Administered 2016-09-01: 3 [IU] via SUBCUTANEOUS
  Administered 2016-09-02 – 2016-09-03 (×5): 2 [IU] via SUBCUTANEOUS
  Administered 2016-09-03: 1 [IU] via SUBCUTANEOUS
  Administered 2016-09-04: 2 [IU] via SUBCUTANEOUS
  Administered 2016-09-04: 1 [IU] via SUBCUTANEOUS
  Administered 2016-09-05: 2 [IU] via SUBCUTANEOUS
  Administered 2016-09-05: 1 [IU] via SUBCUTANEOUS
  Administered 2016-09-06: 2 [IU] via SUBCUTANEOUS
  Administered 2016-09-06: 1 [IU] via SUBCUTANEOUS

## 2016-08-30 MED ORDER — DULOXETINE HCL 30 MG PO CPEP
30.0000 mg | ORAL_CAPSULE | Freq: Every day | ORAL | Status: DC
  Administered 2016-08-30 – 2016-08-31 (×2): 30 mg via ORAL
  Filled 2016-08-30: qty 1

## 2016-08-30 MED ORDER — DICLOFENAC SODIUM 1 % TD GEL
2.0000 g | Freq: Four times a day (QID) | TRANSDERMAL | Status: DC | PRN
  Administered 2016-08-31 – 2016-09-04 (×7): 2 g via TOPICAL
  Filled 2016-08-30 (×2): qty 100

## 2016-08-30 MED ORDER — IPRATROPIUM-ALBUTEROL 0.5-2.5 (3) MG/3ML IN SOLN
3.0000 mL | Freq: Three times a day (TID) | RESPIRATORY_TRACT | Status: DC
  Administered 2016-08-30 – 2016-09-06 (×21): 3 mL via RESPIRATORY_TRACT
  Filled 2016-08-30 (×21): qty 3

## 2016-08-30 MED ORDER — INSULIN ASPART 100 UNIT/ML ~~LOC~~ SOLN: 0.0000 [IU] | Freq: Every day | SUBCUTANEOUS | Status: DC

## 2016-08-30 NOTE — Progress Notes (Signed)
PROGRESS NOTE    Joelle Flessner  VZC:[id number] DOB: Sep 11, 1945 DOA: 08/26/2016 PCP: Hendricks Limes, MD   Brief Narrative: Ronald Simpson is a 71 y.o. male with a past mental history significant of chronic anemia, chronic combined systolic and diastolic heart failure, cirrhosis, HCV, CKD stage III, CVA, diabetes mellitus, insulin-dependent diabetes mellitus, paroxysmal atrial fibrillation patient presented with altered mental status found to have cardiogenic and septic shock secondary to pneumoniae UTI in addition to CHF exacerbation. Patient required intubation and pressors for support. He was weaned off of pressors and extubated on May 17. He has been receiving diuresis for his heart failure exacerbation with improvement in respiratory status. He has been having pleural effusions and had thoracentesis performed on 519.   Assessment & Plan:   Active Problems:   Respiratory failure with hypoxia (HCC)   Acute on chronic systolic heart failure (HCC)   Acute respiratory failure (HCC)   Septic shock (HCC)   Acute respiratory failure with hypoxia Multifactorial from pleural effusions and pneumonia. Extubated on 5/17. S/p thoracentesis on 5/19 with 1.2L out. Initial studies suggest exudative fluid -continue oxygen therapy -fluid culture/cytology pending -repeat chest x-ray  Acute on chronic systolic heart failure Ischemic cardiomyopathy EF of 35-40% with diffuse hypokinesis. Improvement with lasix therapy. -cardiology recommendations -continue IV lasix  Sepsis secondary to pneumonia/UTI Septic shock Sepsis physiology resolved. -continue Vanc/Zosyn; sputum pending, however, unsure if sent -continue fluconazole  Elevated troponin Likely secondary to CHF exacerbation  Cirrhosis Chronic hepatitis C Compensated.  Chronic anemia Secondary to chronic disease. Stable. No evidence of bleeding.  Left foot surgical wound Currently wrapped.  Afebrile.  Hypothyroidism -synthroid  Depression -Restart Cymbalta and Zoloft   DVT prophylaxis: Heparin subcutaneous Code Status: Full code Family Communication: None at bedside Disposition Plan: Discharged pending medical stability   Consultants:   Cardiology  Procedures:   Thoracentesis (5/19)  Antimicrobials:  Vancomycin  Zosyn  Fluconazole    Subjective: Patient reports some dyspnea. No chest pain or palpitations.  Objective: Vitals:   08/30/16 0000 08/30/16 0016 08/30/16 0113 08/30/16 0404  BP:  104/71    Pulse: 93 92  90  Resp: 19 (!) 24  (!) 26  Temp:  98.1 F (36.7 C)  98.1 F (36.7 C)  TempSrc:  Oral  Oral  SpO2: 94% 100% 99% 94%  Weight:      Height:        Intake/Output Summary (Last 24 hours) at 08/30/16 0755 Last data filed at 08/30/16 0513  Gross per 24 hour  Intake              100 ml  Output              775 ml  Net             -675 ml   Filed Weights   08/27/16 0400 08/28/16 0500 08/29/16 0400  Weight: 77.1 kg (169 lb 15.6 oz) 75 kg (165 lb 5.5 oz) 75.2 kg (165 lb 12.6 oz)    Examination:  General exam: Appears calm and comfortable Respiratory system: Clear to auscultation. Respiratory effort normal. Cardiovascular system: S1 & S2 heard, RRR. No murmurs. Gastrointestinal system: Abdomen is nondistended, soft and nontender. Normal bowel sounds heard. Central nervous system: Alert and oriented. No focal neurological deficits. Extremities: No edema. No calf tenderness Skin: No cyanosis. No rashes Psychiatry: Judgement and insight appear normal. Mood & affect appropriate.     Data Reviewed: I have personally reviewed following labs and imaging studies  CBC:  Recent Labs Lab 08/27/16 0000 08/27/16 0411 08/28/16 0500 08/29/16 0455 08/30/16 0500  WBC 9.6 10.9* 10.9* 16.3* 11.8*  NEUTROABS 8.2*  --   --   --   --   HGB 9.7* 9.6* 10.1* 10.2* 10.0*  HCT 31.3* 30.8* 32.4* 32.9* 32.0*  MCV 85.3 85.6 84.4 84.4 84.4  PLT  268 273 236 256 166   Basic Metabolic Panel:  Recent Labs Lab 08/27/16 0000 08/27/16 0411 08/28/16 0500 08/29/16 0455 08/30/16 0500  NA 128* 128* 129* 131* 130*  K 3.8 3.8 4.0 4.0 3.8  CL 95* 97* 97* 96* 96*  CO2 _0 GLUCOSE 61* 80 144* 121* 142*  BUN 41* 40* 41* 42* 47*  CREATININE 1.43* 1.40* 1.51* 1.53* 1.77*  CALCIUM 8.3* 8.0* 8.2* 8.6* 8.3*  MG  --  1.9 2.1 2.0  --   PHOS  --  3.3 3.8 3.5  --    GFR: Estimated Creatinine Clearance: 38.6 mL/min (A) (by C-G formula based on SCr of 1.77 mg/dL (H)). Liver Function Tests:  Recent Labs Lab 08/27/16 0000 08/29/16 1144  AST 28  --   ALT 16*  --   ALKPHOS 141*  --   BILITOT 1.0  --   PROT 7.1 6.8  ALBUMIN 2.4*  --    No results for input(s): LIPASE, AMYLASE in the last 168 hours. No results for input(s): AMMONIA in the last 168 hours. Coagulation Profile:  Recent Labs Lab 08/27/16 0000  INR 1.57   Cardiac Enzymes:  Recent Labs Lab 08/27/16 0016 08/27/16 0411 08/27/16 1120  TROPONINI 0.03* 0.07* 0.06*   BNP (last 3 results) No results for input(s): PROBNP in the last 8760 hours. HbA1C: No results for input(s): HGBA1C in the last 72 hours. CBG:  Recent Labs Lab 08/29/16 1200 08/29/16 1751 08/29/16 2206 08/30/16 0020 08/30/16 0449  GLUCAP 149* 180* 140* 123* 136*   Lipid Profile: No results for input(s): CHOL, HDL, LDLCALC, TRIG, CHOLHDL, LDLDIRECT in the last 72 hours. Thyroid Function Tests: No results for input(s): TSH, T4TOTAL, FREET4, T3FREE, THYROIDAB in the last 72 hours. Anemia Panel: No results for input(s): VITAMINB12, FOLATE, FERRITIN, TIBC, IRON, RETICCTPCT in the last 72 hours. Sepsis Labs:  Recent Labs Lab 08/27/16 0019 08/27/16 0410 08/28/16 0500 08/29/16 0455  PROCALCITON  --  11.06 10.60 6.93  LATICACIDVEN 0.99  --   --   --     Recent Results (from the past 240 hour(s))  Culture, blood (Routine x 2)     Status: None (Preliminary result)   Collection Time:  08/27/16 12:05 AM  Result Value Ref Range Status   Specimen Description BLOOD LEFT FOREARM  Final   Special Requests   Final    BOTTLES DRAWN AEROBIC AND ANAEROBIC Blood Culture adequate volume   Culture NO GROWTH 2 DAYS  Final   Report Status PENDING  Incomplete  Culture, blood (Routine x 2)     Status: None (Preliminary result)   Collection Time: 08/27/16 12:25 AM  Result Value Ref Range Status   Specimen Description BLOOD RIGHT HAND  Final   Special Requests IN PEDIATRIC BOTTLE Blood Culture adequate volume  Final   Culture NO GROWTH 2 DAYS  Final   Report Status PENDING  Incomplete  MRSA PCR Screening     Status: Abnormal   Collection Time: 08/27/16  4:25 AM  Result Value Ref Range Status   MRSA by PCR POSITIVE (A) NEGATIVE Final    Comment:  The GeneXpert MRSA Assay (FDA approved for NASAL specimens only), is one component of a comprehensive MRSA colonization surveillance program. It is not intended to diagnose MRSA infection nor to guide or monitor treatment for MRSA infections. RESULT CALLED TO, READ BACK BY AND VERIFIED WITH: CGerre Pebbles RN 10:05 08/27/16 ([email or web address])   Urine culture     Status: Abnormal   Collection Time: 08/27/16  6:15 AM  Result Value Ref Range Status   Specimen Description URINE, CATHETERIZED  Final   Special Requests NONE  Final   Culture 50,000 COLONIES/mL YEAST (A)  Final   Report Status 08/28/2016 FINAL  Final  Gram stain     Status: None   Collection Time: 08/29/16 11:40 AM  Result Value Ref Range Status   Specimen Description PLEURAL RIGHT  Final   Special Requests NONE  Final   Gram Stain   Final    WBC PRESENT,BOTH PMN AND MONONUCLEAR NO ORGANISMS SEEN    Report Status 08/29/2016 FINAL  Final         Radiology Studies: Dg Chest Port 1 View  Result Date: 08/29/2016 CLINICAL DATA:  Status post right thoracentesis EXAM: PORTABLE CHEST 1 VIEW COMPARISON:  08/29/2016 FINDINGS: Near complete evacuation of the right effusion  following thoracentesis. No pneumothorax. Stable cardiomegaly and mild edema pattern. Bibasilar atelectasis persist, worse on the left. Previous median sternotomy and mitral valve replacement. Right IJ central line tip SVC RA junction. Trachea is midline. Aorta is atherosclerotic. IMPRESSION: Negative for pneumothorax following right thoracentesis. Mild CHF pattern Electronically Signed   By: Jerilynn Mages.  Shick M.D.   On: 08/29/2016 12:22   Dg Chest Port 1 View  Result Date: 08/29/2016 CLINICAL DATA:  Shortness of breath.  Acute respiratory failure. EXAM: PORTABLE CHEST 1 VIEW COMPARISON:  Chest x-ray 08/28/2016 08/27/2016. CT chest 08/27/2016 . FINDINGS: Interim extubation removal of NG tube . Right IJ line in stable position. Prior CABG and cardiac valve replacement. Stable cardiomegaly. Persistent bilateral atelectatic changes. Persistent bilateral pleural effusions, right side greater than left. Chest is unchanged from prior exam. No pneumothorax . IMPRESSION: 1. Interim extubation and removal of NG tube. Right IJ line stable position. 2. Persistent bilateral atelectatic changes with bilateral pleural effusions, right side worse than left. No significant change from prior exam. 3. Prior CABG. Prior cardiac valve replacement. Stable cardiomegaly . Electronically Signed   By: [place]   On: 08/29/2016 07:56        Scheduled Meds: . aspirin  81 mg Per Tube Daily  . atorvastatin  20 mg Per Tube q1800  . carvedilol  3.125 mg Oral BID WC  . chlorhexidine gluconate (MEDLINE KIT)  15 mL Mouth Rinse BID  . collagenase   Topical Daily  . fluconazole  100 mg Oral Daily  . furosemide  60 mg Intravenous BID  . heparin  5,000 Units Subcutaneous Q8H  . insulin aspart  0-15 Units Subcutaneous Q4H  . ipratropium-albuterol  3 mL Nebulization Q6H  . levothyroxine  25 mcg Oral QAC breakfast  . mupirocin ointment   Nasal BID  . pantoprazole  40 mg Oral QHS  . sertraline  25 mg Oral Daily   Continuous  Infusions: . sodium chloride    . piperacillin-tazobactam (ZOSYN)  IV 3.375 g (08/30/16 0513)  . vancomycin       LOS: 3 days     Cordelia Poche, MD [facility] 08/30/2016, 7:55 AM Pager: [phone]  If 7PM-7AM, please contact night-coverage [email or web address] Password [facility] 08/30/2016, 7:55  AM  
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
## 2016-08-30 NOTE — Evaluation (Signed)
Clinical/Bedside Swallow Evaluation Patient Details  Name: Ronald Simpson MRN: [id number] Date of Birth: 1945/08/22  Today's Date: 08/30/2016 Time: SLP Start Time (ACUTE ONLY): 1500 SLP Stop Time (ACUTE ONLY): 1518 SLP Time Calculation (min) (ACUTE ONLY): 18 min  Past Medical History:  Past Medical History:  Diagnosis Date  . CAD (coronary artery disease)    a. remote CABG.  . Chronic anemia   . Chronic combined systolic and diastolic CHF (congestive heart failure) (HCC)   . Cirrhosis (HCC)   . CKD (chronic kidney disease), stage III   . CVA (cerebral infarction)   . DM (diabetes mellitus), type 2 with peripheral vascular complications (HCC)   . History of hepatitis C 1990s   Hep B immune  . Hypoalbuminemia   . IDDM (insulin dependent diabetes mellitus) (HCC)   . Mitral regurgitation    a. s/p prior ring annuloplasty prosthesis per echo with mod MR by echo 07/2016.  Marland Kitchen. PAF (paroxysmal atrial fibrillation) (HCC)    a. not on anticoagulation likely due to h/o IC hemorrhage, prior GIB, cirrhosis. b. suspected prior LAA clipping based on imaging.  . Peripheral neuropathy   . Peripheral vascular disease (HCC)   . Proteinuria    Past Surgical History:  Past Surgical History:  Procedure Laterality Date  . AMPUTATION Right 01/07/2016   Procedure: AMPUTATION ABOVE KNEE;  Surgeon: Larina Earthlyodd F Early, MD;  [facility];  Service: Vascular;  Laterality: Right;  . AMPUTATION Left 04/04/2016   Procedure: LEFT FOURTH AND FIFTH  TOE AMPUTATION;  Surgeon: Larina Earthlyodd F Early, MD;  [facility];  Service: Vascular;  Laterality: Left;  . BELOW KNEE LEG AMPUTATION Right   . CORONARY ARTERY BYPASS GRAFT  2005  . ESOPHAGOGASTRODUODENOSCOPY N/A 05/17/2016   Procedure: ESOPHAGOGASTRODUODENOSCOPY (EGD);  Surgeon: Meryl DareMalcolm T Stark, MD;  [facility];  Service: Endoscopy;  Laterality: N/A;  . IR GENERIC HISTORICAL  07/07/2016   IR PARACENTESIS 07/07/2016 Gershon CraneWendy Sanders Blair, PA-C [facility]-INTER[facility] RAD  . IR  THORACENTESIS ASP PLEURAL SPACE W/IMG GUIDE  08/04/2016  . PERIPHERAL VASCULAR CATHETERIZATION N/A 01/11/2016   Procedure: Lower Extremity Angiography;  Surgeon: Nada LibmanVance W Brabham, MD;  [facility];  Service: Cardiovascular;  Laterality: N/A;  . STERNOTOMY     HPI:  Pt is a 71 y.o. male with PMH of of CAD (coronary artery disease); Chronic anemia; Chronic combined systolic and diastolic CHF (congestive heart failure) (HCC); Cirrhosis (HCC); CKD (chronic kidney disease), stage III; CVA (cerebral infarction); DM (diabetes mellitus), type 2 with peripheral vascular complications (HCC); History of hepatitis C (1990s); Hypoalbuminemia; IDDM (insulin dependent diabetes mellitus) (HCC); Mitral regurgitation; PAF (paroxysmal atrial fibrillation) (HCC); Peripheral neuropathy; Peripheral vascular disease (HCC); and Proteinuria. Admitted 5/15 with decreased level of consciousness along with hypoglycemia (CBG 30s initially) and SOB. Upon arrival to ED, repeat CBG mid 60s and pt was noted to be in obvious respiratory distress. He was subsequently intubated and PCCM was called for admission, extubated 5/17. CXR on 5/19 showed worsening bilateral perihilar and lower lobe opacities which may reflect edema, atelectasis, or a combination; small bilateral pleural effusions. Pt was also recently admitted on 4/21 with pleural effusions/ bibasilar opacities on CXR. Pt had recent EGD which was normal. Bedside swallow eval ordered.   Assessment / Plan / Recommendation Clinical Impression  Pt showed no overt s/s of aspiration at bedside consuming thin liquid and puree consistencies. Offered regular solid trial; however, pt declined due to dentures being unavailable, describing difficulty chewing regular solids without  them. Pt admitted occasional coughing while consuming meals, stated that this occurs with solid foods but was unable to state examples of foods that are difficult to consume. Pt was not sure if he had  these symptoms PTA. RN reported pt coughing intermittently during meal earlier today. Given decreased respiratory status, pt is at an increased risk of aspiration. Recommend downgrading diet to dysphagia 3/ thin liquids, meds whole with liquid, intermittent supervision to ensure pt seated upright and taking small bites/ sips. Will continue to follow for diet tolerance check. SLP Visit Diagnosis: Dysphagia, unspecified (R13.10)    Aspiration Risk  Mild aspiration risk;Moderate aspiration risk    Diet Recommendation Dysphagia 3 (Mech soft);Thin liquid   Liquid Administration via: Cup;Straw Medication Administration: Whole meds with liquid Supervision: Patient able to self feed;Intermittent supervision to cue for compensatory strategies Compensations: Slow rate;Small sips/bites Postural Changes: Seated upright at 90 degrees    Other  Recommendations Oral Care Recommendations: Oral care BID   Follow up Recommendations None      Frequency and Duration min 1 x/week  1 week       Prognosis        Swallow Study   General HPI: Pt is a 71 y.o. male with PMH of of CAD (coronary artery disease); Chronic anemia; Chronic combined systolic and diastolic CHF (congestive heart failure) (HCC); Cirrhosis (HCC); CKD (chronic kidney disease), stage III; CVA (cerebral infarction); DM (diabetes mellitus), type 2 with peripheral vascular complications (HCC); History of hepatitis C (1990s); Hypoalbuminemia; IDDM (insulin dependent diabetes mellitus) (HCC); Mitral regurgitation; PAF (paroxysmal atrial fibrillation) (HCC); Peripheral neuropathy; Peripheral vascular disease (HCC); and Proteinuria. Admitted 5/15 with decreased level of consciousness along with hypoglycemia (CBG 30s initially) and SOB. Upon arrival to ED, repeat CBG mid 60s and pt was noted to be in obvious respiratory distress. He was subsequently intubated and PCCM was called for admission, extubated 5/17. CXR on 5/19 showed worsening bilateral  perihilar and lower lobe opacities which may reflect edema, atelectasis, or a combination; small bilateral pleural effusions. Pt was also recently admitted on 4/21 with pleural effusions/ bibasilar opacities on CXR. Pt had recent EGD which was normal. Bedside swallow eval ordered. Type of Study: Bedside Swallow Evaluation Previous Swallow Assessment:  (none in chart) Diet Prior to this Study: Regular;Thin liquids Temperature Spikes Noted: No Respiratory Status: Nasal cannula History of Recent Intubation: Yes Length of Intubations (days):  (2) Date extubated: 08/28/16 Behavior/Cognition: Alert;Cooperative;Pleasant mood Oral Cavity Assessment: Within Functional Limits Oral Care Completed by SLP: No Oral Cavity - Dentition: Dentures, not available;Missing dentition Vision: Functional for self-feeding Self-Feeding Abilities: Able to feed self Patient Positioning: Upright in bed Baseline Vocal Quality: Normal Volitional Cough: Strong Volitional Swallow: Able to elicit    Oral/Motor/Sensory Function Overall Oral Motor/Sensory Function: Within functional limits   Ice Chips Ice chips: Not tested   Thin Liquid Thin Liquid: Within functional limits    Nectar Thick Nectar Thick Liquid: Not tested   Honey Thick Honey Thick Liquid: Not tested   Puree Puree: Within functional limits   Solid   GO   Solid: Not tested Other Comments:  (declined, stating "I don't mess with that")        Amy Cecille Aver, MA, CCC-SLP 08/30/2016,3:24 PM

## 2016-08-31 ENCOUNTER — Inpatient Hospital Stay (HOSPITAL_COMMUNITY): Payer: Medicare Other

## 2016-08-31 DIAGNOSIS — E162 Hypoglycemia, unspecified: Secondary | ICD-10-CM

## 2016-08-31 LAB — GLUCOSE, CAPILLARY
GLUCOSE-CAPILLARY: 183 mg/dL — AB (ref 65–99)
GLUCOSE-CAPILLARY: 163 mg/dL — AB (ref 65–99)
GLUCOSE-CAPILLARY: 180 mg/dL — AB (ref 65–99)
GLUCOSE-CAPILLARY: 188 mg/dL — AB (ref 65–99)
Glucose-Capillary: 154 mg/dL — ABNORMAL HIGH (ref 65–99)

## 2016-08-31 LAB — CBC
HEMATOCRIT: 30.3 % — AB (ref 39.0–52.0)
Hemoglobin: 9.4 g/dL — ABNORMAL LOW (ref 13.0–17.0)
MCH: 26.4 pg (ref 26.0–34.0)
MCHC: 31 g/dL (ref 30.0–36.0)
MCV: 85.1 fL (ref 78.0–100.0)
Platelets: 206 10*3/uL (ref 150–400)
RBC: 3.56 MIL/uL — ABNORMAL LOW (ref 4.22–5.81)
RDW: 19.1 % — ABNORMAL HIGH (ref 11.5–15.5)
WBC: 8.8 10*3/uL (ref 4.0–10.5)

## 2016-08-31 LAB — BASIC METABOLIC PANEL
Anion gap: 9 (ref 5–15)
BUN: 49 mg/dL — AB (ref 6–20)
CO2: 24 mmol/L (ref 22–32)
CREATININE: 1.9 mg/dL — AB (ref 0.61–1.24)
Calcium: 8.4 mg/dL — ABNORMAL LOW (ref 8.9–10.3)
Chloride: 95 mmol/L — ABNORMAL LOW (ref 101–111)
GFR calc Af Amer: 40 mL/min — ABNORMAL LOW (ref >60)
GFR, EST NON AFRICAN AMERICAN: 35 mL/min — AB (ref >60)
Glucose, Bld: 167 mg/dL — ABNORMAL HIGH (ref 65–99)
Potassium: 3.4 mmol/L — ABNORMAL LOW (ref 3.5–5.1)
SODIUM: 128 mmol/L — AB (ref 135–145)

## 2016-08-31 LAB — COOXEMETRY PANEL
Carboxyhemoglobin: 1.3 % (ref 0.5–1.5)
Methemoglobin: 0.7 % (ref 0.0–1.5)
O2 Saturation: 51.3 %
Total hemoglobin: 9.9 g/dL — ABNORMAL LOW (ref 12.0–16.0)

## 2016-08-31 LAB — PROCALCITONIN: Procalcitonin: 2.17 ng/mL

## 2016-08-31 MED ORDER — DOXYCYCLINE HYCLATE 100 MG PO TABS
100.0000 mg | ORAL_TABLET | Freq: Two times a day (BID) | ORAL | Status: DC
  Administered 2016-08-31 – 2016-09-04 (×9): 100 mg via ORAL
  Filled 2016-08-31 (×9): qty 1

## 2016-08-31 MED ORDER — MILRINONE LACTATE IN DEXTROSE 20-5 MG/100ML-% IV SOLN
0.1250 ug/kg/min | INTRAVENOUS | Status: DC
  Administered 2016-08-31 – 2016-09-04 (×6): 0.25 ug/kg/min via INTRAVENOUS
  Filled 2016-08-31 (×7): qty 100

## 2016-08-31 MED ORDER — POTASSIUM CHLORIDE CRYS ER 20 MEQ PO TBCR
20.0000 meq | EXTENDED_RELEASE_TABLET | Freq: Once | ORAL | Status: AC
  Administered 2016-08-31: 20 meq via ORAL
  Filled 2016-08-31: qty 1

## 2016-08-31 MED ORDER — AMOXICILLIN-POT CLAVULANATE 875-125 MG PO TABS
1.0000 | ORAL_TABLET | Freq: Two times a day (BID) | ORAL | Status: AC
  Administered 2016-08-31 – 2016-09-03 (×7): 1 via ORAL
  Filled 2016-08-31 (×7): qty 1
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
## 2016-08-31 NOTE — Progress Notes (Signed)
Pharmacy Consult for Milrinone (Primacor) Initiation  Indication:   Acute Decompensated Heart Failure with volume overload and low cardiac output  Allergies  Allergen Reactions  . Hydralazine Other (See Comments)    4/21-4/26/18 pleuritic chest pain with bilateral effusions and pericardial rub. Rule out hydralazine drug-induced lupus   . Percocet [Oxycodone-Acetaminophen] Other (See Comments)    Needing narcan on multiple occasions due to oversedation after getting percocet   . Tramadol Other (See Comments)    Excess sedation; ? Related to pre-existing hepatic dysfunction with PMH Hep C    Temp:  [97.6 F (36.4 C)-98.5 F (36.9 C)] 97.6 F (36.4 C) (05/20 0810) Pulse Rate:  [84-106] 84 (05/20 0810) Cardiac Rhythm: Normal sinus rhythm (05/20 0844) Resp:  [13-25] 13 (05/20 0810) BP: (114-130)/(77-106) 124/94 (05/20 0810) SpO2:  [95 %-100 %] 95 % (05/20 0810) Weight:  [167 lb 15.9 oz (76.2 kg)] 167 lb 15.9 oz (76.2 kg) (05/20 0826)  LABS    Component Value Date/Time   NA 128 (L) 08/31/2016 0551   NA 133 (A) 08/15/2016   K 3.4 (L) 08/31/2016 0551   CL 95 (L) 08/31/2016 0551   CO2 24 08/31/2016 0551   GLUCOSE 167 (H) 08/31/2016 0551   BUN 49 (H) 08/31/2016 0551   BUN 28 (A) 08/15/2016   CREATININE 1.90 (H) 08/31/2016 0551   CALCIUM 8.4 (L) 08/31/2016 0551   GFRNONAA 35 (L) 08/31/2016 0551   GFRAA 40 (L) 08/31/2016 0551   Last magnesium:  Lab Results  Component Value Date   MG 2.0 08/29/2016   Estimated Creatinine Clearance: 36 mL/min (A) (by C-G formula based on SCr of 1.9 mg/dL (H)). Serum creatinine: 1.9 mg/dL (H) 16/01/9604/20/18 [id number] Estimated creatinine clearance: 36 mL/min (A) estimated creatinine clearance is 36 mL/min (A) (by C-G formula based on SCr of 1.9 mg/dL (H)).   Intake/Output Summary (Last 24 hours) at 08/31/16 1108 Last data filed at 08/31/16 0800  Gross per 24 hour  Intake              680 ml  Output             1050 ml  Net             -370 ml     Filed Weights   08/29/16 0400 08/30/16 0700 08/31/16 0826  Weight: 165 lb 12.6 oz (75.2 kg) 165 lb 9.1 oz (75.1 kg) 167 lb 15.9 oz (76.2 kg)    Assessment:  71 y.o. male  with acute decompensated congestive heart failure to be initiated on milrinone. Patient with EF 35-40% and noted on lasix 60mg  IV q12h since 5/18 with not net UOP.  Plan is to initiate milrinone for inotropic support. -Wt 77.6 at admit (5/15) down to 76.2kg today -K= 3.4 (mg= 2.0 on 5/18),  SCr= 1.9 (trend up with CrCl ~ 35) -Co-ox= 51.3  Plan:  1. Initiate milrinone based on renal function: (Consider starting dose of 0.125 - 0.25 for patients with SBP <12410mmHg) [facility] Calculated CrCl Dose  []  > 50 ml/min 0.375 mcg/kg/min  [x]  20-49 ml/min 0.250 mcg/kg/min  []  < 20 ml/min 0.125 mcg/kg/min   2. Nursing to monitor vital signs per milrinone protocol and physician parameters. 3. Pharmacy to follow peripherally, please reconsult if needed or there is further questions. 4.  Please contact MD for further dosing instructions.  Thank you for allowing us to be a part of this patient's care.  Harland GermanAndrew Mikail Goostree, Pharm D 08/31/2016 11:18 AM

## 2016-08-31 NOTE — Evaluation (Signed)
Physical Therapy Evaluation Patient Details Name: Ronald Simpson MRN: [id number] DOB: 1945/05/06 Today's Date: 08/31/2016   History of Present Illness  Pt is 71 yo male admitted through ED on 08/26/16 from SNF with acute encephalopathy and unresponsive. Found to be in respiratory failure with hypoxia, CHF, septic shock related to pneumonia/UTI. Pt has a past surgical wound on left LE and is R AKA. PMH significant for CAD, anemia, CHF, cirrhosis, CKD3, CVA, DM2, Hep C, PAF, neuropathy, PVD.    Clinical Impression  Pt presents with the above diagnosis and below deficits for therapy evaluation. Prior to admission, pt was at a SNF receiving rehab when he developed the above diagnoses. [facility] to SNF placement[facility] and had some assistance from his mother per previous chart review. Pt requires Max A for supine to sit and Mod A for sit to supine with assistance to move up bed. Pt does have good strength in LLE to assist with moving up in bed. Pt will benefit from continued acute PT services in order to address the below deficits prior to discharge back to SNF.     Follow Up Recommendations SNF;Supervision for mobility/OOB    Equipment Recommendations  None recommended by PT    Recommendations for Other Services       Precautions / Restrictions Precautions Precautions: Fall Restrictions Weight Bearing Restrictions: No Other Position/Activity Restrictions: R AKA, L toe amputations      Mobility  Bed Mobility Overal bed mobility: Needs Assistance Bed Mobility: Supine to Sit;Sit to Supine     Supine to sit: Max assist;+2 for physical assistance Sit to supine: Mod assist   General bed mobility comments: Max A to get EOB +2, Mod A to lay supine in bed and pt is able to initiate movement.   Transfers                    Ambulation/Gait                Stairs            Wheelchair Mobility    Modified Rankin (Stroke Patients Only)        Balance Overall balance assessment: Needs assistance Sitting-balance support: Bilateral upper extremity supported;Feet supported Sitting balance-Leahy Scale: Fair Sitting balance - Comments: able to sit EOB x about 5 minutes.                                      Pertinent Vitals/Pain Pain Assessment: No/denies pain    Home Living Family/patient expects to be discharged to:: Skilled nursing facility               Home Equipment: Wheelchair - manual;Walker - 2 wheels      Prior Function Level of Independence: Needs assistance   Gait / Transfers Assistance Needed: States mod indep with lateral scoot into manual w/c and can self-propel. Recently got RLE prosthetic and plans to start walking with it once back at SNF.   ADL's / Homemaking Assistance Needed: mother assist with meals, pt was sponge bathing        Hand Dominance   Dominant Hand: Right    Extremity/Trunk Assessment   Upper Extremity Assessment Upper Extremity Assessment: Defer to OT evaluation    Lower Extremity Assessment Lower Extremity Assessment: RLE deficits/detail;LLE deficits/detail RLE Deficits / Details: R AKA with full hip ROM at least 3/5  LLE Deficits / Details: L toe amputations; LLE grossly 4/5 throughout       Communication   Communication: No difficulties  Cognition Arousal/Alertness: Awake/alert Behavior During Therapy: WFL for tasks assessed/performed Overall Cognitive Status: Within Functional Limits for tasks assessed                                        General Comments      Exercises     Assessment/Plan    PT Assessment    PT Problem List         PT Treatment Interventions      PT Goals (Current goals can be found in the Care Plan section)  Acute Rehab PT Goals Patient Stated Goal: Feel better PT Goal Formulation: With patient Time For Goal Achievement: 09/14/16 Potential to Achieve Goals: Fair    Frequency Min 2X/week    Barriers to discharge        Co-evaluation               AM-PAC PT "6 Clicks" Daily Activity  Outcome Measure Difficulty turning over in bed (including adjusting bedclothes, sheets and blankets)?: Total Difficulty moving from lying on back to sitting on the side of the bed? : Total Difficulty sitting down on and standing up from a chair with arms (e.g., wheelchair, bedside commode, etc,.)?: Total Help needed moving to and from a bed to chair (including a wheelchair)?: Total Help needed walking in hospital room?: Total Help needed climbing 3-5 steps with a railing? : Total 6 Click Score: 6    End of Session Equipment Utilized During Treatment: Oxygen Activity Tolerance: Patient tolerated treatment well Patient left: in bed;with call bell/phone within reach;with bed alarm set Nurse Communication: Mobility status PT Visit Diagnosis: Other abnormalities of gait and mobility (R26.89);Muscle weakness (generalized) (M62.81)    Time: 1610-9604 PT Time Calculation (min) (ACUTE ONLY): 23 min   Charges:   PT Evaluation $PT Eval Moderate Complexity: 1 Procedure PT Treatments $Therapeutic Activity: 8-22 mins   PT G Codes:        Colin Broach PT, DPT  [phone]   Ruel Favors Aletha Halim 08/31/2016, 2:23 PM

## 2016-08-31 NOTE — Progress Notes (Signed)
PROGRESS NOTE    Ronald Simpson  FEO:[id number] DOB: [birth year] DOA: 08/26/2016 PCP: Hendricks Limes, MD   Brief Narrative: Ronald Simpson is a 71 y.o. male with a past mental history significant of chronic anemia, chronic combined systolic and diastolic heart failure, cirrhosis, HCV, CKD stage III, CVA, diabetes mellitus, insulin-dependent diabetes mellitus, paroxysmal atrial fibrillation patient presented with altered mental status found to have cardiogenic and septic shock secondary to pneumoniae UTI in addition to CHF exacerbation. Patient required intubation and pressors for support. He was weaned off of pressors and extubated on May 17. He has been receiving diuresis for his heart failure exacerbation with improvement in respiratory status. He has been having pleural effusions and had thoracentesis performed on 519.   Assessment & Plan:   Active Problems:   Respiratory failure with hypoxia (HCC)   Acute on chronic systolic heart failure (HCC)   Acute respiratory failure (HCC)   Septic shock (HCC)   Acute respiratory failure with hypoxia Multifactorial from pleural effusions and pneumonia. Extubated on 5/17. S/p thoracentesis on 5/19 with 1.2L out. Initial studies suggest exudative fluid. Worsened exam today. -continue oxygen therapy -fluid culture (no growth x1 day)/cytology pending -repeat chest x-ray   Acute on chronic systolic heart failure Ischemic cardiomyopathy EF of 35-40% with diffuse hypokinesis. Improvement with lasix therapy. -cardiology recommendations -continue IV lasix per cardiology  Pleural effusions S/p thoracentesis on 5/19. Fluid study suggests exudative. -cytology pending as above  Sepsis secondary to pneumonia/UTI Septic shock Sepsis physiology resolved. -discontinue vanc/zosyn -start Augmentin/Doxycycline -continue fluconazole  Elevated troponin Likely secondary to CHF exacerbation. Flat trend.  Cirrhosis Chronic hepatitis  C Compensated.  Chronic anemia Secondary to chronic disease. Stable. No evidence of bleeding.  Left foot surgical wound Currently wrapped. Afebrile.  Hypothyroidism -synthroid  Depression -Continue Cymbalta and Zoloft  Acute kidney injury Baseline appears to be 1.1. Creatinine up to 1.90 today. Patient appears overloaded. Doubt this is secondary to aggressive diuresis. Will likely improve with further diuresis. -watch closely -diuresis per cardiology -repeat BMP   DVT prophylaxis: Heparin subcutaneous Code Status: Full code Family Communication: None at bedside Disposition Plan: Discharged pending medical stability   Consultants:   Cardiology  Procedures:   Thoracentesis (5/19)  Antimicrobials:  Vancomycin  Zosyn  Augmentin (5/20>>  Doxycycline (5/20>>  Fluconazole    Subjective: Dyspnea and pain at site of thoracentesis. Also some foot pain.  Objective: Vitals:   08/31/16 0354 08/31/16 0400 08/31/16 0810 08/31/16 0826  BP: 123/82 123/82 (!) 124/94   Pulse: 86 85 84   Resp: 17 18 13    Temp: 98.2 F (36.8 C)  97.6 F (36.4 C)   TempSrc: Oral  Oral   SpO2: 96% 96% 95%   Weight:    76.2 kg (167 lb 15.9 oz)  Height:        Intake/Output Summary (Last 24 hours) at 08/31/16 1035 Last data filed at 08/31/16 0800  Gross per 24 hour  Intake              680 ml  Output             1050 ml  Net             -370 ml   Filed Weights   08/29/16 0400 08/30/16 0700 08/31/16 0826  Weight: 75.2 kg (165 lb 12.6 oz) 75.1 kg (165 lb 9.1 oz) 76.2 kg (167 lb 15.9 oz)    Examination:  General exam: Appears calm and comfortable Respiratory system: increase crackles  bilaterally. Respiratory effort normal. Cardiovascular system: S1 & S2 heard, RRR. No murmurs. Gastrointestinal system: Abdomen is nondistended, soft and nontender. Hypoactive bowel sounds heard. Central nervous system: Alert and oriented. No focal neurological deficits. Extremities: No edema. No  calf tenderness. Right AKA Skin: No cyanosis. No rashes Psychiatry: Judgement and insight appear normal. Mood & affect appropriate.     Data Reviewed: I have personally reviewed following labs and imaging studies  CBC:  Recent Labs Lab 08/27/16 0000 08/27/16 0411 08/28/16 0500 08/29/16 0455 08/30/16 0500 08/31/16 0551  WBC 9.6 10.9* 10.9* 16.3* 11.8* 8.8  NEUTROABS 8.2*  --   --   --   --   --   HGB 9.7* 9.6* 10.1* 10.2* 10.0* 9.4*  HCT 31.3* 30.8* 32.4* 32.9* 32.0* 30.3*  MCV 85.3 85.6 84.4 84.4 84.4 85.1  PLT 268 273 236 256 212 563   Basic Metabolic Panel:  Recent Labs Lab 08/27/16 0411 08/28/16 0500 08/29/16 0455 08/30/16 0500 08/31/16 0551  NA 128* 129* 131* 130* 128*  K 3.8 4.0 4.0 3.8 3.4*  CL 97* 97* 96* 96* 95*  CO2 23 23 25 24 24   GLUCOSE 80 144* 121* 142* 167*  BUN 40* 41* 42* 47* 49*  CREATININE 1.40* 1.51* 1.53* 1.77* 1.90*  CALCIUM 8.0* 8.2* 8.6* 8.3* 8.4*  MG 1.9 2.1 2.0  --   --   PHOS 3.3 3.8 3.5  --   --    GFR: Estimated Creatinine Clearance: 36 mL/min (A) (by C-G formula based on SCr of 1.9 mg/dL (H)). Liver Function Tests:  Recent Labs Lab 08/27/16 0000 08/29/16 1144  AST 28  --   ALT 16*  --   ALKPHOS 141*  --   BILITOT 1.0  --   PROT 7.1 6.8  ALBUMIN 2.4*  --    No results for input(s): LIPASE, AMYLASE in the last 168 hours. No results for input(s): AMMONIA in the last 168 hours. Coagulation Profile:  Recent Labs Lab 08/27/16 0000  INR 1.57   Cardiac Enzymes:  Recent Labs Lab 08/27/16 0016 08/27/16 0411 08/27/16 1120  TROPONINI 0.03* 0.07* 0.06*   BNP (last 3 results) No results for input(s): PROBNP in the last 8760 hours. HbA1C: No results for input(s): HGBA1C in the last 72 hours. CBG:  Recent Labs Lab 08/30/16 1706 08/30/16 2045 08/30/16 2322 08/31/16 0353 08/31/16 0807  GLUCAP 146* 187* 197* 183* 163*   Lipid Profile: No results for input(s): CHOL, HDL, LDLCALC, TRIG, CHOLHDL, LDLDIRECT in the  last 72 hours. Thyroid Function Tests: No results for input(s): TSH, T4TOTAL, FREET4, T3FREE, THYROIDAB in the last 72 hours. Anemia Panel: No results for input(s): VITAMINB12, FOLATE, FERRITIN, TIBC, IRON, RETICCTPCT in the last 72 hours. Sepsis Labs:  Recent Labs Lab 08/27/16 0019 08/27/16 0410 08/28/16 0500 08/29/16 0455 08/31/16 0551  PROCALCITON  --  11.06 10.60 6.93 2.17  LATICACIDVEN 0.99  --   --   --   --     Recent Results (from the past 240 hour(s))  Culture, blood (Routine x 2)     Status: None (Preliminary result)   Collection Time: 08/27/16 12:05 AM  Result Value Ref Range Status   Specimen Description BLOOD LEFT FOREARM  Final   Special Requests   Final    BOTTLES DRAWN AEROBIC AND ANAEROBIC Blood Culture adequate volume   Culture NO GROWTH 3 DAYS  Final   Report Status PENDING  Incomplete  Culture, blood (Routine x 2)     Status: None (  Preliminary result)   Collection Time: 08/27/16 12:25 AM  Result Value Ref Range Status   Specimen Description BLOOD RIGHT HAND  Final   Special Requests IN PEDIATRIC BOTTLE Blood Culture adequate volume  Final   Culture NO GROWTH 3 DAYS  Final   Report Status PENDING  Incomplete  MRSA PCR Screening     Status: Abnormal   Collection Time: 08/27/16  4:25 AM  Result Value Ref Range Status   MRSA by PCR POSITIVE (A) NEGATIVE Final    Comment:        The GeneXpert MRSA Assay (FDA approved for NASAL specimens only), is one component of a comprehensive MRSA colonization surveillance program. It is not intended to diagnose MRSA infection nor to guide or monitor treatment for MRSA infections. RESULT CALLED TO, READ BACK BY AND VERIFIED WITH: CGerre Pebbles RN 10:05 08/27/16 ([email or web address])   Urine culture     Status: Abnormal   Collection Time: 08/27/16  6:15 AM  Result Value Ref Range Status   Specimen Description URINE, CATHETERIZED  Final   Special Requests NONE  Final   Culture 50,000 COLONIES/mL YEAST (A)  Final   Report Status  08/28/2016 FINAL  Final  Gram stain     Status: None   Collection Time: 08/29/16 11:40 AM  Result Value Ref Range Status   Specimen Description PLEURAL RIGHT  Final   Special Requests NONE  Final   Gram Stain   Final    WBC PRESENT,BOTH PMN AND MONONUCLEAR NO ORGANISMS SEEN    Report Status 08/29/2016 FINAL  Final  Culture, body fluid-bottle     Status: None (Preliminary result)   Collection Time: 08/29/16 11:40 AM  Result Value Ref Range Status   Specimen Description PLEURAL RIGHT  Final   Special Requests NONE  Final   Culture NO GROWTH 1 DAY  Final   Report Status PENDING  Incomplete         Radiology Studies: Dg Chest Port 1 View  Result Date: 08/31/2016 CLINICAL DATA:  Rib fracture.  Coronary artery disease.  Dyspnea. EXAM: PORTABLE CHEST 1 VIEW COMPARISON:  One-view chest x-ray 08/30/2016 FINDINGS: A right IJ line is stable. Bilateral pleural effusions are increasing, right greater than left. Associated airspace disease is present. Moderate pulmonary vascular congestion is worse right than left. IMPRESSION: 1. Cardiomegaly with increasing the moderate pulmonary vascular congestion and right greater than left pleural effusions compatible with congestive heart failure. 2. Asymmetric right-sided airspace disease likely reflects atelectasis. Pneumonia is not excluded. Electronically Signed   By: San Morelle M.D.   On: 08/31/2016 08:37   Dg Chest Port 1 View  Result Date: 08/30/2016 CLINICAL DATA:  Pleural effusion EXAM: PORTABLE CHEST 1 VIEW COMPARISON:  08/29/2016 FINDINGS: Right central line remains in place, unchanged. Prior median sternotomy and valve replacement. Cardiomegaly. Worsening bilateral perihilar and lower lobe airspace opacities. Small bilateral effusions. IMPRESSION: Worsening bilateral perihilar and lower lobe opacities which may reflect edema, atelectasis, or a combination. Small bilateral pleural effusions. Electronically Signed   By: Rolm Baptise M.D.    On: 08/30/2016 10:33   Dg Chest Port 1 View  Result Date: 08/29/2016 CLINICAL DATA:  Status post right thoracentesis EXAM: PORTABLE CHEST 1 VIEW COMPARISON:  08/29/2016 FINDINGS: Near complete evacuation of the right effusion following thoracentesis. No pneumothorax. Stable cardiomegaly and mild edema pattern. Bibasilar atelectasis persist, worse on the left. Previous median sternotomy and mitral valve replacement. Right IJ central line tip SVC RA junction. Trachea is midline. Aorta  is atherosclerotic. IMPRESSION: Negative for pneumothorax following right thoracentesis. Mild CHF pattern Electronically Signed   By: Jerilynn Mages.  Shick M.D.   On: 08/29/2016 12:22        Scheduled Meds: . amoxicillin-clavulanate  1 tablet Oral Q12H  . aspirin  81 mg Per Tube Daily  . atorvastatin  20 mg Per Tube q1800  . carvedilol  3.125 mg Oral BID WC  . chlorhexidine gluconate (MEDLINE KIT)  15 mL Mouth Rinse BID  . collagenase   Topical Daily  . doxycycline  100 mg Oral Q12H  . DULoxetine  30 mg Oral Daily  . fluconazole  100 mg Oral Daily  . furosemide  60 mg Intravenous BID  . heparin  5,000 Units Subcutaneous Q8H  . insulin aspart  0-5 Units Subcutaneous QHS  . insulin aspart  0-9 Units Subcutaneous TID WC  . ipratropium-albuterol  3 mL Nebulization TID  . levothyroxine  25 mcg Oral QAC breakfast  . mupirocin ointment   Nasal BID  . pantoprazole  40 mg Oral QHS  . sertraline  25 mg Oral Daily   Continuous Infusions: . sodium chloride       LOS: 4 days     Cordelia Poche, MD [facility] 08/31/2016, 10:35 AM Pager: [phone]  If 7PM-7AM, please contact night-coverage [email or web address] Password [email or web address] 08/31/2016, 10:35 AM

## 2016-09-01 LAB — COOXEMETRY PANEL
CARBOXYHEMOGLOBIN: 1 % (ref 0.5–1.5)
Methemoglobin: 1 % (ref 0.0–1.5)
O2 SAT: 60.8 %
TOTAL HEMOGLOBIN: 11.5 g/dL — AB (ref 12.0–16.0)

## 2016-09-01 LAB — BASIC METABOLIC PANEL
ANION GAP: 7 (ref 5–15)
BUN: 46 mg/dL — ABNORMAL HIGH (ref 6–20)
CALCIUM: 8.2 mg/dL — AB (ref 8.9–10.3)
CHLORIDE: 97 mmol/L — AB (ref 101–111)
CO2: 25 mmol/L (ref 22–32)
Creatinine, Ser: 1.61 mg/dL — ABNORMAL HIGH (ref 0.61–1.24)
GFR calc non Af Amer: 42 mL/min — ABNORMAL LOW (ref >60)
GFR, EST AFRICAN AMERICAN: 49 mL/min — AB (ref >60)
Glucose, Bld: 164 mg/dL — ABNORMAL HIGH (ref 65–99)
Potassium: 3.1 mmol/L — ABNORMAL LOW (ref 3.5–5.1)
Sodium: 129 mmol/L — ABNORMAL LOW (ref 135–145)

## 2016-09-01 LAB — CULTURE, BLOOD (ROUTINE X 2)
CULTURE: NO GROWTH
Culture: NO GROWTH
SPECIAL REQUESTS: ADEQUATE
Special Requests: ADEQUATE

## 2016-09-01 LAB — CBC
HCT: 28.8 % — ABNORMAL LOW (ref 39.0–52.0)
Hemoglobin: 8.8 g/dL — ABNORMAL LOW (ref 13.0–17.0)
MCH: 25.9 pg — AB (ref 26.0–34.0)
MCHC: 30.6 g/dL (ref 30.0–36.0)
MCV: 84.7 fL (ref 78.0–100.0)
PLATELETS: 196 10*3/uL (ref 150–400)
RBC: 3.4 MIL/uL — AB (ref 4.22–5.81)
RDW: 18.6 % — AB (ref 11.5–15.5)
WBC: 8.1 10*3/uL (ref 4.0–10.5)

## 2016-09-01 LAB — GLUCOSE, CAPILLARY
GLUCOSE-CAPILLARY: 170 mg/dL — AB (ref 65–99)
GLUCOSE-CAPILLARY: 166 mg/dL — AB (ref 65–99)
Glucose-Capillary: 224 mg/dL — ABNORMAL HIGH (ref 65–99)
Glucose-Capillary: 182 mg/dL — ABNORMAL HIGH (ref 65–99)

## 2016-09-01 MED ORDER — POTASSIUM CHLORIDE CRYS ER 20 MEQ PO TBCR
40.0000 meq | EXTENDED_RELEASE_TABLET | Freq: Once | ORAL | Status: AC
  Administered 2016-09-01: 40 meq via ORAL
  Filled 2016-09-01: qty 2

## 2016-09-01 MED ORDER — HYDROCODONE-ACETAMINOPHEN 5-325 MG PO TABS
1.0000 | ORAL_TABLET | Freq: Four times a day (QID) | ORAL | Status: DC | PRN
  Administered 2016-09-01 – 2016-09-06 (×12): 1 via ORAL
  Filled 2016-09-01 (×12): qty 1

## 2016-09-01 MED ORDER — POLYETHYLENE GLYCOL 3350 17 G PO PACK
17.0000 g | PACK | Freq: Every day | ORAL | Status: DC
  Administered 2016-09-01 – 2016-09-06 (×6): 17 g via ORAL
  Filled 2016-09-01 (×6): qty 1

## 2016-09-01 MED ORDER — SENNA 8.6 MG PO TABS
1.0000 | ORAL_TABLET | Freq: Every day | ORAL | Status: DC
  Administered 2016-09-01 – 2016-09-06 (×6): 8.6 mg via ORAL
  Filled 2016-09-01 (×6): qty 1

## 2016-09-01 MED ORDER — FUROSEMIDE 10 MG/ML IJ SOLN
80.0000 mg | Freq: Two times a day (BID) | INTRAMUSCULAR | Status: DC
  Administered 2016-09-01 – 2016-09-03 (×6): 80 mg via INTRAVENOUS
  Filled 2016-09-01 (×6): qty 8

## 2016-09-01 MED ORDER — SPIRONOLACTONE 25 MG PO TABS
12.5000 mg | ORAL_TABLET | Freq: Every day | ORAL | Status: DC
  Administered 2016-09-01: 12.5 mg via ORAL
  Filled 2016-09-01: qty 1

## 2016-09-01 NOTE — Progress Notes (Signed)
PROGRESS NOTE    Ronald Simpson  WCB:[id number] DOB: December 22, 1945 DOA: 08/26/2016 PCP: Hendricks Limes, MD   Brief Narrative: Ronald Simpson is a 71 y.o. male with a past mental history significant of chronic anemia, chronic combined systolic and diastolic heart failure, cirrhosis, HCV, CKD stage III, CVA, diabetes mellitus, insulin-dependent diabetes mellitus, paroxysmal atrial fibrillation patient presented with altered mental status found to have cardiogenic and septic shock secondary to pneumoniae UTI in addition to CHF exacerbation. Patient required intubation and pressors for support. He was weaned off of pressors and extubated on May 17. He has been receiving diuresis for his heart failure exacerbation with improvement in respiratory status. He has been having pleural effusions and had thoracentesis performed on 519.   Assessment & Plan:   Active Problems:   Respiratory failure with hypoxia (HCC)   Acute on chronic systolic heart failure (HCC)   Acute respiratory failure (HCC)   Septic shock (HCC)   Acute respiratory failure with hypoxia Multifactorial from pleural effusions and pneumonia. Extubated on 5/17. S/p thoracentesis on 5/19 with 1.2L out. Initial studies suggest exudative fluid.  -continue oxygen therapy -fluid culture (no growth x1 day)/cytology pending -repeat chest x-ray in AM -treat pneumonia -PT recommendations: SNF  Acute on chronic systolic heart failure Ischemic cardiomyopathy EF of 35-40% with diffuse hypokinesis. Improvement with lasix therapy. -cardiology recommendations: lasix, milronone  Pleural effusions S/p thoracentesis on 5/19. Fluid study suggests exudative. -cytology pending as above  Sepsis secondary to pneumonia/UTI Septic shock Sepsis physiology resolved. -continue Augmentin/Doxycycline -continue fluconazole  Elevated troponin Likely secondary to CHF exacerbation. Flat trend.  Cirrhosis Chronic hepatitis  C Compensated.  Chronic anemia Secondary to chronic disease. Stable. No evidence of bleeding.  Left foot surgical wound Currently wrapped. Afebrile.  Hypothyroidism -synthroid  Depression -Continue Zoloft  Acute kidney injury Baseline appears to be 1.1. Creatinine up to 1.90 today. Patient appears overloaded. Doubt this is secondary to aggressive diuresis. Improved with continued diuresis -watch closely -diuresis per cardiology -repeat BMP   DVT prophylaxis: Heparin subcutaneous Code Status: Full code Family Communication: None at bedside Disposition Plan: Discharged pending medical stability   Consultants:   Cardiology  Procedures:   Thoracentesis (5/19)  Antimicrobials:  Vancomycin  Zosyn  Augmentin (5/20>>  Doxycycline (5/20>>  Fluconazole    Subjective: No dyspnea or chest pain. Still requiring oxygen.  Objective: Vitals:   09/01/16 0000 09/01/16 0400 09/01/16 0628 09/01/16 0701  BP: 121/78 121/75  (!) 145/82  Pulse: 87 86 85 (!) 44  Resp: 16 14 17 17   Temp: 98 F (36.7 C)   98 F (36.7 C)  TempSrc: Oral   Oral  SpO2: 94% 97% 95% 97%  Weight:   76.6 kg (168 lb 14 oz)   Height:        Intake/Output Summary (Last 24 hours) at 09/01/16 1029 Last data filed at 09/01/16 0600  Gross per 24 hour  Intake           100.79 ml  Output             1250 ml  Net         -1149.21 ml   Filed Weights   08/30/16 0700 08/31/16 0826 09/01/16 0628  Weight: 75.1 kg (165 lb 9.1 oz) 76.2 kg (167 lb 15.9 oz) 76.6 kg (168 lb 14 oz)    Examination:  General exam: Appears calm and comfortable Respiratory system: Crackles bilaterally up to low-mid lung. Respiratory effort normal. Cardiovascular system: S1 & S2 heard, RRR.  No murmurs. Gastrointestinal system: Abdomen is nondistended, soft and nontender. Hypoactive bowel sounds heard. Central nervous system: Alert and oriented. No focal neurological deficits. Extremities: Trace edema thigh edema. No calf  tenderness. Right AKA Skin: No cyanosis. No rashes Psychiatry: Judgement and insight appear normal. Mood & affect appropriate. No agitation.     Data Reviewed: I have personally reviewed following labs and imaging studies  CBC:  Recent Labs Lab 08/27/16 0000  08/28/16 0500 08/29/16 0455 08/30/16 0500 08/31/16 0551 09/01/16 0614  WBC 9.6  < > 10.9* 16.3* 11.8* 8.8 8.1  NEUTROABS 8.2*  --   --   --   --   --   --   HGB 9.7*  < > 10.1* 10.2* 10.0* 9.4* 8.8*  HCT 31.3*  < > 32.4* 32.9* 32.0* 30.3* 28.8*  MCV 85.3  < > 84.4 84.4 84.4 85.1 84.7  PLT 268  < > 236 256 212 206 196  < > = values in this interval not displayed. Basic Metabolic Panel:  Recent Labs Lab 08/27/16 0411 08/28/16 0500 08/29/16 0455 08/30/16 0500 08/31/16 0551 09/01/16 0614  NA 128* 129* 131* 130* 128* 129*  K 3.8 4.0 4.0 3.8 3.4* 3.1*  CL 97* 97* 96* 96* 95* 97*  CO2 23 23 25 24 24 25   GLUCOSE 80 144* 121* 142* 167* 164*  BUN 40* 41* 42* 47* 49* 46*  CREATININE 1.40* 1.51* 1.53* 1.77* 1.90* 1.61*  CALCIUM 8.0* 8.2* 8.6* 8.3* 8.4* 8.2*  MG 1.9 2.1 2.0  --   --   --   PHOS 3.3 3.8 3.5  --   --   --    GFR: Estimated Creatinine Clearance: 42.5 mL/min (A) (by C-G formula based on SCr of 1.61 mg/dL (H)). Liver Function Tests:  Recent Labs Lab 08/27/16 0000 08/29/16 1144  AST 28  --   ALT 16*  --   ALKPHOS 141*  --   BILITOT 1.0  --   PROT 7.1 6.8  ALBUMIN 2.4*  --    No results for input(s): LIPASE, AMYLASE in the last 168 hours. No results for input(s): AMMONIA in the last 168 hours. Coagulation Profile:  Recent Labs Lab 08/27/16 0000  INR 1.57   Cardiac Enzymes:  Recent Labs Lab 08/27/16 0016 08/27/16 0411 08/27/16 1120  TROPONINI 0.03* 0.07* 0.06*   BNP (last 3 results) No results for input(s): PROBNP in the last 8760 hours. HbA1C: No results for input(s): HGBA1C in the last 72 hours. CBG:  Recent Labs Lab 08/31/16 0807 08/31/16 1257 08/31/16 1751 08/31/16 2255  09/01/16 0832  GLUCAP 163* 180* 188* 154* 170*   Lipid Profile: No results for input(s): CHOL, HDL, LDLCALC, TRIG, CHOLHDL, LDLDIRECT in the last 72 hours. Thyroid Function Tests: No results for input(s): TSH, T4TOTAL, FREET4, T3FREE, THYROIDAB in the last 72 hours. Anemia Panel: No results for input(s): VITAMINB12, FOLATE, FERRITIN, TIBC, IRON, RETICCTPCT in the last 72 hours. Sepsis Labs:  Recent Labs Lab 08/27/16 0019 08/27/16 0410 08/28/16 0500 08/29/16 0455 08/31/16 0551  PROCALCITON  --  11.06 10.60 6.93 2.17  LATICACIDVEN 0.99  --   --   --   --     Recent Results (from the past 240 hour(s))  Culture, blood (Routine x 2)     Status: None   Collection Time: 08/27/16 12:05 AM  Result Value Ref Range Status   Specimen Description BLOOD LEFT FOREARM  Final   Special Requests   Final    BOTTLES DRAWN AEROBIC  AND ANAEROBIC Blood Culture adequate volume   Culture NO GROWTH 5 DAYS  Final   Report Status 09/01/2016 FINAL  Final  Culture, blood (Routine x 2)     Status: None   Collection Time: 08/27/16 12:25 AM  Result Value Ref Range Status   Specimen Description BLOOD RIGHT HAND  Final   Special Requests IN PEDIATRIC BOTTLE Blood Culture adequate volume  Final   Culture NO GROWTH 5 DAYS  Final   Report Status 09/01/2016 FINAL  Final  MRSA PCR Screening     Status: Abnormal   Collection Time: 08/27/16  4:25 AM  Result Value Ref Range Status   MRSA by PCR POSITIVE (A) NEGATIVE Final    Comment:        The GeneXpert MRSA Assay (FDA approved for NASAL specimens only), is one component of a comprehensive MRSA colonization surveillance program. It is not intended to diagnose MRSA infection nor to guide or monitor treatment for MRSA infections. RESULT CALLED TO, READ BACK BY AND VERIFIED WITH: CGerre Pebbles RN 10:05 08/27/16 ([email or web address])   Urine culture     Status: Abnormal   Collection Time: 08/27/16  6:15 AM  Result Value Ref Range Status   Specimen Description URINE,  CATHETERIZED  Final   Special Requests NONE  Final   Culture 50,000 COLONIES/mL YEAST (A)  Final   Report Status 08/28/2016 FINAL  Final  Gram stain     Status: None   Collection Time: 08/29/16 11:40 AM  Result Value Ref Range Status   Specimen Description PLEURAL RIGHT  Final   Special Requests NONE  Final   Gram Stain   Final    WBC PRESENT,BOTH PMN AND MONONUCLEAR NO ORGANISMS SEEN    Report Status 08/29/2016 FINAL  Final  Culture, body fluid-bottle     Status: None (Preliminary result)   Collection Time: 08/29/16 11:40 AM  Result Value Ref Range Status   Specimen Description PLEURAL RIGHT  Final   Special Requests NONE  Final   Culture NO GROWTH 3 DAYS  Final   Report Status PENDING  Incomplete         Radiology Studies: Dg Chest Port 1 View  Result Date: 08/31/2016 CLINICAL DATA:  Rib fracture.  Coronary artery disease.  Dyspnea. EXAM: PORTABLE CHEST 1 VIEW COMPARISON:  One-view chest x-ray 08/30/2016 FINDINGS: A right IJ line is stable. Bilateral pleural effusions are increasing, right greater than left. Associated airspace disease is present. Moderate pulmonary vascular congestion is worse right than left. IMPRESSION: 1. Cardiomegaly with increasing the moderate pulmonary vascular congestion and right greater than left pleural effusions compatible with congestive heart failure. 2. Asymmetric right-sided airspace disease likely reflects atelectasis. Pneumonia is not excluded. Electronically Signed   By: San Morelle M.D.   On: 08/31/2016 08:37        Scheduled Meds: . amoxicillin-clavulanate  1 tablet Oral Q12H  . aspirin  81 mg Per Tube Daily  . atorvastatin  20 mg Per Tube q1800  . carvedilol  3.125 mg Oral BID WC  . chlorhexidine gluconate (MEDLINE KIT)  15 mL Mouth Rinse BID  . collagenase   Topical Daily  . doxycycline  100 mg Oral Q12H  . fluconazole  100 mg Oral Daily  . furosemide  80 mg Intravenous BID  . heparin  5,000 Units Subcutaneous Q8H  .  insulin aspart  0-5 Units Subcutaneous QHS  . insulin aspart  0-9 Units Subcutaneous TID WC  . ipratropium-albuterol  3 mL Nebulization  TID  . levothyroxine  25 mcg Oral QAC breakfast  . mupirocin ointment   Nasal BID  . pantoprazole  40 mg Oral QHS  . potassium chloride  40 mEq Oral Once  . sertraline  25 mg Oral Daily  . spironolactone  12.5 mg Oral Daily   Continuous Infusions: . sodium chloride    . milrinone 0.25 mcg/kg/min (09/01/16 0600)     LOS: 5 days     Cordelia Poche, MD [facility] 09/01/2016, 10:29 AM Pager: [phone]  If 7PM-7AM, please contact night-coverage [email or web address] Password [facility] 09/01/2016, 10:29 AM
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
## 2016-09-02 ENCOUNTER — Inpatient Hospital Stay (HOSPITAL_COMMUNITY): Payer: Medicare Other

## 2016-09-02 DIAGNOSIS — R570 Cardiogenic shock: Secondary | ICD-10-CM

## 2016-09-02 DIAGNOSIS — I480 Paroxysmal atrial fibrillation: Secondary | ICD-10-CM

## 2016-09-02 LAB — BASIC METABOLIC PANEL
ANION GAP: 8 (ref 5–15)
BUN: 43 mg/dL — ABNORMAL HIGH (ref 6–20)
CHLORIDE: 97 mmol/L — AB (ref 101–111)
CO2: 26 mmol/L (ref 22–32)
Calcium: 8.6 mg/dL — ABNORMAL LOW (ref 8.9–10.3)
Creatinine, Ser: 1.36 mg/dL — ABNORMAL HIGH (ref 0.61–1.24)
GFR calc non Af Amer: 52 mL/min — ABNORMAL LOW (ref >60)
GLUCOSE: 160 mg/dL — AB (ref 65–99)
Potassium: 4 mmol/L (ref 3.5–5.1)
Sodium: 131 mmol/L — ABNORMAL LOW (ref 135–145)

## 2016-09-02 LAB — CBC
HCT: 30.9 % — ABNORMAL LOW (ref 39.0–52.0)
HEMOGLOBIN: 9.5 g/dL — AB (ref 13.0–17.0)
MCH: 26.1 pg (ref 26.0–34.0)
MCHC: 30.7 g/dL (ref 30.0–36.0)
MCV: 84.9 fL (ref 78.0–100.0)
PLATELETS: 218 10*3/uL (ref 150–400)
RBC: 3.64 MIL/uL — AB (ref 4.22–5.81)
RDW: 18.4 % — ABNORMAL HIGH (ref 11.5–15.5)
WBC: 7.3 10*3/uL (ref 4.0–10.5)

## 2016-09-02 LAB — GLUCOSE, CAPILLARY
GLUCOSE-CAPILLARY: 171 mg/dL — AB (ref 65–99)
Glucose-Capillary: 160 mg/dL — ABNORMAL HIGH (ref 65–99)
Glucose-Capillary: 163 mg/dL — ABNORMAL HIGH (ref 65–99)

## 2016-09-02 LAB — COOXEMETRY PANEL
CARBOXYHEMOGLOBIN: 1.5 % (ref 0.5–1.5)
METHEMOGLOBIN: 0.7 % (ref 0.0–1.5)
O2 SAT: 57 %
Total hemoglobin: 10 g/dL — ABNORMAL LOW (ref 12.0–16.0)

## 2016-09-02 LAB — PROCALCITONIN: PROCALCITONIN: 0.54 ng/mL

## 2016-09-02 MED ORDER — METOLAZONE 2.5 MG PO TABS
2.5000 mg | ORAL_TABLET | Freq: Once | ORAL | Status: AC
  Administered 2016-09-02: 2.5 mg via ORAL
  Filled 2016-09-02: qty 1

## 2016-09-02 MED ORDER — POTASSIUM CHLORIDE CRYS ER 20 MEQ PO TBCR
40.0000 meq | EXTENDED_RELEASE_TABLET | Freq: Once | ORAL | Status: AC
  Administered 2016-09-02: 40 meq via ORAL
  Filled 2016-09-02: qty 2

## 2016-09-02 MED ORDER — SPIRONOLACTONE 25 MG PO TABS
25.0000 mg | ORAL_TABLET | Freq: Every day | ORAL | Status: DC
  Administered 2016-09-02 – 2016-09-06 (×5): 25 mg via ORAL
  Filled 2016-09-02 (×5): qty 1

## 2016-09-02 MED ORDER — LOSARTAN POTASSIUM 25 MG PO TABS
25.0000 mg | ORAL_TABLET | Freq: Every day | ORAL | Status: DC
  Administered 2016-09-02: 25 mg via ORAL
  Filled 2016-09-02: qty 1

## 2016-09-02 NOTE — Progress Notes (Addendum)
Heart Failure Navigator Consult Note  Presentation:per dr Lorenza Evangelist Chalfant is a 71 y.o. male with a past medical history significant of chronic anemia, chronic combined systolic and diastolic heart failure, cirrhosis, HCV, CKD stage III, CVA, diabetes mellitus, insulin-dependent diabetes mellitus, paroxysmal atrial fibrillation patient presented with altered mental status found to have cardiogenic and septic shock secondary to pneumoniae UTI in addition to CHF exacerbation. Patient required intubation and pressors for support. He was weaned off of pressors and extubated on May 17. He has been receiving diuresis for his heart failure exacerbation with improvement in respiratory status.  Past Medical History:  Diagnosis Date  . CAD (coronary artery disease)    a. remote CABG.  . Chronic anemia   . Chronic combined systolic and diastolic CHF (congestive heart failure) (HCC)   . Cirrhosis (HCC)   . CKD (chronic kidney disease), stage III   . CVA (cerebral infarction)   . DM (diabetes mellitus), type 2 with peripheral vascular complications (HCC)   . History of hepatitis C 1990s   Hep B immune  . Hypoalbuminemia   . IDDM (insulin dependent diabetes mellitus) (HCC)   . Mitral regurgitation    a. s/p prior ring annuloplasty prosthesis per echo with mod MR by echo 07/2016.  Marland Kitchen PAF (paroxysmal atrial fibrillation) (HCC)    a. not on anticoagulation likely due to h/o IC hemorrhage, prior GIB, cirrhosis. b. suspected prior LAA clipping based on imaging.  . Peripheral neuropathy   . Peripheral vascular disease (HCC)   . Proteinuria     Social History   Social History  . Marital status: Widowed    Spouse name: N/A  . Number of children: N/A  . Years of education: N/A   Social History Main Topics  . Smoking status: Current Every Day Smoker    Packs/day: 0.10    Years: 54.00    Types: Cigarettes  . Smokeless tobacco: Never Used  . Alcohol use Yes     Comment: 04/02/2016 "aint suppose  to drink in the nursing home"  . Drug use: Yes    Types: Cocaine, Marijuana     Comment: marijuana use daily, rare cocain use.  Previously did snort and inject drugs through 2017. Hasn't had access to street drugs from 10/201 7 through February/201 8  . Sexual activity: Not Currently   Other Topics Concern  . None   Social History Narrative   As of fall/2017 patient had been living in [place] but then entered SNF in [place]. His wife passed away in summer 2017.    ECHO:Study Conclusions--08/03/16  - Left ventricle: The cavity size was normal. Wall thickness was   increased in a pattern of mild LVH. Systolic function was   moderately reduced. The estimated ejection fraction was in the   range of 35% to 40%. Diffuse hypokinesis worse in inferior and   inferolateral walls. - Ventricular septum: The contour showed diastolic flattening and   systolic flattening. - Mitral valve: Calcified annulus. An annular ring prosthesis was   present. There was moderate regurgitation. There is inflow   turbulence across the MV ring. Visually the pressure 1/2 time   velocity looks mild but was not quantified. The gradient was not   measured. Consider repeat imaging to further evalaute. - Left atrium: The atrium was moderately dilated. - Right ventricle: The cavity size was moderately dilated. Systolic   function was moderately reduced. - Right atrium: The atrium was severely dilated. - Tricuspid valve: There was moderate regurgitation. -  Pulmonary arteries: PA peak pressure: 43 mm Hg (S).  ------------------------------------------------------------------- Study data:  No prior study was available for comparison.  Study status:  Routine.  Procedure:  Transthoracic echocardiography. Image quality was adequate.  Study completion:  There were no complications.          Transthoracic echocardiography.  M-mode, complete 2D, spectral Doppler, and color Doppler.  Birthdate: Patient birthdate:  1945/08/16.  Age:  Patient is 71 yr old.  Sex: Gender: male.    BMI: 25.2 kg/m^2.  Blood pressure:     150/85 Patient status:  Inpatient.  Study date:  Study date: 08/03/2016. Study time: 11:47 AM.  Location:  Bedside.  BNP    Component Value Date/Time   BNP 1,093.5 (H) 08/27/2016 0016    ProBNP No results found for: PROBNP   Education Assessment and Provision:  Detailed education and instructions provided on heart failure disease management including the following:  Signs and symptoms of Heart Failure When to call the physician Importance of daily weights Low sodium diet Fluid restriction Medication management Anticipated future follow-up appointments  Patient education given on each of the above topics.  Patient acknowledges understanding and acceptance of all instructions.  I spoke with Mr. [name] regarding his HF and current hospitalization.  He tells me that he lived with his ? mother who is in "good shape" before going to [facility].   He says he eats low sodium and does not add salt.  He says that the "nurses make sure that he weighs every day" and before being at [facility] he said he weighed at home.  He denies any issues with getting or taking prescribed medications at home.  He will follow with the [facility] after discharge.  Education Materials:  "Living Better With Heart Failure" Booklet, Daily Weight Tracker Tool   High Risk Criteria for Readmission and/or Poor Patient Outcomes:  (Recommend Follow-up with [facility])--yes   EF <30%- 35-40%  2 or more admissions in 6 months- yes 4/2659mo  Difficult social situation-No  Demonstrates medication noncompliance- denies    Barriers of Care:  Knowledge and compliance  Discharge Planning:   To return to [facility] versus home with [place] ? [place]

## 2016-09-02 NOTE — Progress Notes (Signed)
2000-Pt noted at beginning of shift to have urinal between legs and penis laying inside. Urinal removed to empty urine and penis/scrotum noted to have MASD along with the penis having a sore at the meatus. Pt informed and shown the area on penis and was encouraged to not keep penis laying inside urinal constantly. Pt was also offered the use of a condom catheter but he adamantly refused and insisted on keeping urinal between legs even knowing the risk.   2300- pt bathed and repositioned. Pt again was educated on the possibility of skin breakdown to genital area d/t improper use of urinal. Condom catheter offered again and pt still refused and insists on keeping urinal in between legs.

## 2016-09-02 NOTE — Progress Notes (Signed)
PROGRESS NOTE    Ronald Simpson  FWY:[id number] DOB: [birth year] DOA: 08/26/2016 PCP: Hendricks Limes, MD   Brief Narrative: Ronald Simpson is a 71 y.o. male with a past mental history significant of chronic anemia, chronic combined systolic and diastolic heart failure, cirrhosis, HCV, CKD stage III, CVA, diabetes mellitus, insulin-dependent diabetes mellitus, paroxysmal atrial fibrillation patient presented with altered mental status found to have cardiogenic and septic shock secondary to pneumoniae UTI in addition to CHF exacerbation. Patient required intubation and pressors for support. He was weaned off of pressors and extubated on May 17. He has been receiving diuresis for his heart failure exacerbation with improvement in respiratory status. He has been having pleural effusions and had thoracentesis performed on 5/19. Continuing aggressive diuresis.   Assessment & Plan:   Active Problems:   Respiratory failure with hypoxia (HCC)   Acute on chronic systolic heart failure (HCC)   Acute respiratory failure (HCC)   Septic shock (HCC)   Acute respiratory failure with hypoxia Multifactorial from pleural effusions and pneumonia. Extubated on 5/17. S/p thoracentesis on 5/19 with 1.2L out. Initial studies suggest exudative fluid. Symptoms improved. Chest x-ray with continued effusions -continue oxygen therapy -fluid culture (no growth x3 days)/cytology still pending on 5/23 -treat pneumonia -PT recommendations: SNF  Acute on chronic systolic heart failure Ischemic cardiomyopathy EF of 35-40% with diffuse hypokinesis. Improvement with lasix therapy. -cardiology recommendations: lasix, milronone, CVP  Pleural effusions S/p thoracentesis on 5/19. Fluid study suggests exudative. -cytology pending as above  Sepsis secondary to pneumonia/UTI Septic shock Sepsis physiology resolved. Urine significant for yeast. Will cover for aspiration/MRSA. -continue Augmentin/Doxycycline -continue  fluconazole  Elevated troponin Likely secondary to CHF exacerbation. Flat trend.  Cirrhosis Chronic hepatitis C Compensated.  Chronic anemia Secondary to chronic disease. Stable. No evidence of bleeding.  Left foot surgical wound Currently wrapped. Afebrile. -continue Norco prn  Hypothyroidism -synthroid  Depression -Continue Zoloft  Acute kidney injury Baseline appears to be 1.1. Creatinine up to 1.90 today. Patient appears overloaded. Doubt this is secondary to aggressive diuresis. Continues to improve with diuresis. -watch closely -diuresis per cardiology -repeat BMP   DVT prophylaxis: Heparin subcutaneous Code Status: Full code Family Communication: None at bedside Disposition Plan: Discharge to SNF pending medical stability   Consultants:   Cardiology  Procedures:   Thoracentesis (5/19)  Antimicrobials:  Vancomycin  Zosyn  Augmentin (5/20>>  Doxycycline (5/20>>  Fluconazole (5/18>>   Subjective: Continues to have no dyspnea. Requiring oxygen. States his foot hurts intermittently. No chest pain.  Objective: Vitals:   09/02/16 0600 09/02/16 0700 09/02/16 0902 09/02/16 0943  BP: (!) 162/97 (!) 161/83 (!) 156/98 (!) 156/98  Pulse: (!) 45 (!) 44 93 93  Resp: 13 15 18 15   Temp:   97.9 F (36.6 C)   TempSrc:   Oral   SpO2: 95% 95% 95% 95%  Weight:      Height:        Intake/Output Summary (Last 24 hours) at 09/02/16 1102 Last data filed at 09/02/16 0700  Gross per 24 hour  Intake            702.5 ml  Output             1165 ml  Net           -462.5 ml   Filed Weights   08/31/16 0826 09/01/16 0628 09/02/16 0351  Weight: 76.2 kg (167 lb 15.9 oz) 76.6 kg (168 lb 14 oz) 76.6 kg (168 lb 14 oz)  Examination:  General exam: Appears calm and comfortable Respiratory system: Crackles bilaterally up to low-mid lung. Respiratory effort normal. Cardiovascular system: S1 & S2 heard, RRR. No murmurs. Gastrointestinal system: Abdomen is  nondistended, soft and nontender. Hypoactive bowel sounds heard. Central nervous system: Alert and oriented. No focal neurological deficits. Extremities: Trace edema thigh edema. No calf tenderness. Right AKA Skin: No cyanosis. No rashes Psychiatry: Judgement and insight appear normal. Mood & affect appropriate. No agitation.     Data Reviewed: I have personally reviewed following labs and imaging studies  CBC:  Recent Labs Lab 08/27/16 0000  08/29/16 0455 08/30/16 0500 08/31/16 0551 09/01/16 0614 09/02/16 0531  WBC 9.6  < > 16.3* 11.8* 8.8 8.1 7.3  NEUTROABS 8.2*  --   --   --   --   --   --   HGB 9.7*  < > 10.2* 10.0* 9.4* 8.8* 9.5*  HCT 31.3*  < > 32.9* 32.0* 30.3* 28.8* 30.9*  MCV 85.3  < > 84.4 84.4 85.1 84.7 84.9  PLT 268  < > 256 212 206 196 218  < > = values in this interval not displayed. Basic Metabolic Panel:  Recent Labs Lab 08/27/16 0411 08/28/16 0500 08/29/16 0455 08/30/16 0500 08/31/16 0551 09/01/16 0614 09/02/16 0531  NA 128* 129* 131* 130* 128* 129* 131*  K 3.8 4.0 4.0 3.8 3.4* 3.1* 4.0  CL 97* 97* 96* 96* 95* 97* 97*  CO2 23 23 25 24 24 25 26   GLUCOSE 80 144* 121* 142* 167* 164* 160*  BUN 40* 41* 42* 47* 49* 46* 43*  CREATININE 1.40* 1.51* 1.53* 1.77* 1.90* 1.61* 1.36*  CALCIUM 8.0* 8.2* 8.6* 8.3* 8.4* 8.2* 8.6*  MG 1.9 2.1 2.0  --   --   --   --   PHOS 3.3 3.8 3.5  --   --   --   --    GFR: Estimated Creatinine Clearance: 50.3 mL/min (A) (by C-G formula based on SCr of 1.36 mg/dL (H)). Liver Function Tests:  Recent Labs Lab 08/27/16 0000 08/29/16 1144  AST 28  --   ALT 16*  --   ALKPHOS 141*  --   BILITOT 1.0  --   PROT 7.1 6.8  ALBUMIN 2.4*  --    Coagulation Profile:  Recent Labs Lab 08/27/16 0000  INR 1.57   Cardiac Enzymes:  Recent Labs Lab 08/27/16 0016 08/27/16 0411 08/27/16 1120  TROPONINI 0.03* 0.07* 0.06*   BNP (last 3 results) No results for input(s): PROBNP in the last 8760 hours. HbA1C: No results for  input(s): HGBA1C in the last 72 hours. CBG:  Recent Labs Lab 09/01/16 0832 09/01/16 1131 09/01/16 1752 09/01/16 2228 09/02/16 0904  GLUCAP 170* 224* 182* 166* 160*   Sepsis Labs:  Recent Labs Lab 08/27/16 0019  08/28/16 0500 08/29/16 0455 08/31/16 0551 09/02/16 0531  PROCALCITON  --   < > 10.60 6.93 2.17 0.54  LATICACIDVEN 0.99  --   --   --   --   --   < > = values in this interval not displayed.  Recent Results (from the past 240 hour(s))  Culture, blood (Routine x 2)     Status: None   Collection Time: 08/27/16 12:05 AM  Result Value Ref Range Status   Specimen Description BLOOD LEFT FOREARM  Final   Special Requests   Final    BOTTLES DRAWN AEROBIC AND ANAEROBIC Blood Culture adequate volume   Culture NO GROWTH 5 DAYS  Final  Report Status 09/01/2016 FINAL  Final  Culture, blood (Routine x 2)     Status: None   Collection Time: 08/27/16 12:25 AM  Result Value Ref Range Status   Specimen Description BLOOD RIGHT HAND  Final   Special Requests IN PEDIATRIC BOTTLE Blood Culture adequate volume  Final   Culture NO GROWTH 5 DAYS  Final   Report Status 09/01/2016 FINAL  Final  MRSA PCR Screening     Status: Abnormal   Collection Time: 08/27/16  4:25 AM  Result Value Ref Range Status   MRSA by PCR POSITIVE (A) NEGATIVE Final    Comment:        The GeneXpert MRSA Assay (FDA approved for NASAL specimens only), is one component of a comprehensive MRSA colonization surveillance program. It is not intended to diagnose MRSA infection nor to guide or monitor treatment for MRSA infections. RESULT CALLED TO, READ BACK BY AND VERIFIED WITH: CGerre Pebbles RN 10:05 08/27/16 ([email or web address])   Urine culture     Status: Abnormal   Collection Time: 08/27/16  6:15 AM  Result Value Ref Range Status   Specimen Description URINE, CATHETERIZED  Final   Special Requests NONE  Final   Culture 50,000 COLONIES/mL YEAST (A)  Final   Report Status 08/28/2016 FINAL  Final  Gram stain      Status: None   Collection Time: 08/29/16 11:40 AM  Result Value Ref Range Status   Specimen Description PLEURAL RIGHT  Final   Special Requests NONE  Final   Gram Stain   Final    WBC PRESENT,BOTH PMN AND MONONUCLEAR NO ORGANISMS SEEN    Report Status 08/29/2016 FINAL  Final  Culture, body fluid-bottle     Status: None (Preliminary result)   Collection Time: 08/29/16 11:40 AM  Result Value Ref Range Status   Specimen Description PLEURAL RIGHT  Final   Special Requests NONE  Final   Culture NO GROWTH 3 DAYS  Final   Report Status PENDING  Incomplete         Radiology Studies: Dg Chest Port 1 View  Result Date: 09/02/2016 CLINICAL DATA:  CHF EXAM: PORTABLE CHEST 1 VIEW COMPARISON:  08/31/2016 FINDINGS: Cardiac shadow is stable. Postsurgical changes are again seen. Right jugular central line is again noted and stable. Some improved aeration is noted in the bases bilaterally although persistent bilateral pleural effusions are seen. No pneumothorax is noted. No bony abnormality is seen. IMPRESSION: Improving aeration in the bases although persistent effusions are again seen. Electronically Signed   By: Inez Catalina M.D.   On: 09/02/2016 07:36    Scheduled Meds: . amoxicillin-clavulanate  1 tablet Oral Q12H  . aspirin  81 mg Per Tube Daily  . atorvastatin  20 mg Per Tube q1800  . carvedilol  3.125 mg Oral BID WC  . chlorhexidine gluconate (MEDLINE KIT)  15 mL Mouth Rinse BID  . collagenase   Topical Daily  . doxycycline  100 mg Oral Q12H  . furosemide  80 mg Intravenous BID  . heparin  5,000 Units Subcutaneous Q8H  . insulin aspart  0-5 Units Subcutaneous QHS  . insulin aspart  0-9 Units Subcutaneous TID WC  . ipratropium-albuterol  3 mL Nebulization TID  . levothyroxine  25 mcg Oral QAC breakfast  . metolazone  2.5 mg Oral Once  . mupirocin ointment   Nasal BID  . pantoprazole  40 mg Oral QHS  . polyethylene glycol  17 g Oral Daily  . potassium chloride  40 mEq Oral Once  .  senna  1 tablet Oral Daily  . sertraline  25 mg Oral Daily  . spironolactone  25 mg Oral Daily   Continuous Infusions: . sodium chloride 250 mL (09/01/16 1900)  . milrinone 0.25 mcg/kg/min (09/02/16 0950)     LOS: 6 days     Cordelia Poche, MD [facility] 09/02/2016, 11:02 AM Pager: [phone]  If 7PM-7AM, please contact night-coverage [email or web address] Password [email or web address] 09/02/2016, 11:02 AM
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
## 2016-09-02 NOTE — Progress Notes (Signed)
PT Cancellation Note  Patient Details Name: Ronald Simpson MRN: [id number] DOB: [birth year]   Cancelled Treatment:    Reason Eval/Treat Not Completed: Patient declined, no reason specified Pt declined PT services due to pain and not wanting to move. WIll follow up as appropriate.   Blake DivineShauna A Lamarr Feenstra 09/02/2016, 10:55 AM  Mylo RedShauna Eythan Jayne, PT, DPT [phone]

## 2016-09-02 NOTE — Progress Notes (Signed)
  Speech Language Pathology Treatment: Dysphagia  Patient Details Name: Ronald Simpson MRN: [id number] DOB: 1945-06-12 Today's Date: 09/02/2016 Time: [id number] SLP Time Calculation (min) (ACUTE ONLY): 9 min  Assessment / Plan / Recommendation Clinical Impression  Pt declined any liquids, but consumed soft solids with mildly prolonged mastication, which is not unexpected given his missing dentition. No other overt signs of dysphagia or aspiration were observed. He remains afebrile and with improving basilar aeration per chart review. Would continue current diet but with brief SLP f/u for more thorough observation given pt's limited intake this afternoon.   HPI HPI: Pt is a [age] y.o. male with PMH of of CAD (coronary artery disease); Chronic anemia; Chronic combined systolic and diastolic CHF (congestive heart failure) (HCC); Cirrhosis (HCC); CKD (chronic kidney disease), stage III; CVA (cerebral infarction); DM (diabetes mellitus), type 2 with peripheral vascular complications (HCC); History of hepatitis C (1990s); Hypoalbuminemia; IDDM (insulin dependent diabetes mellitus) (HCC); Mitral regurgitation; PAF (paroxysmal atrial fibrillation) (HCC); Peripheral neuropathy; Peripheral vascular disease (HCC); and Proteinuria. Admitted 5/15 with decreased level of consciousness along with hypoglycemia (CBG 30s initially) and SOB. Upon arrival to ED, repeat CBG mid 60s and pt was noted to be in obvious respiratory distress. He was subsequently intubated and PCCM was called for admission, extubated 5/17. CXR on 5/19 showed worsening bilateral perihilar and lower lobe opacities which may reflect edema, atelectasis, or a combination; small bilateral pleural effusions. Pt was also recently admitted on 4/21 with pleural effusions/ bibasilar opacities on CXR. Pt had recent EGD which was normal. Bedside swallow eval ordered.      SLP Plan  Continue with current plan of care       Recommendations  Diet  recommendations: Dysphagia 3 (mechanical soft);Thin liquid Liquids provided via: Cup;Straw Medication Administration: Whole meds with liquid Supervision: Patient able to self feed;Intermittent supervision to cue for compensatory strategies Compensations: Slow rate;Small sips/bites Postural Changes and/or Swallow Maneuvers: Seated upright 90 degrees                Oral Care Recommendations: Oral care BID Follow up Recommendations: None SLP Visit Diagnosis: Dysphagia, unspecified (R13.10) Plan: Continue with current plan of care       GO                Ronald Simpson, Ronald Simpson 09/02/2016, 4:04 PM  Ronald Simpson, M.A. CCC-SLP [phone]

## 2016-09-02 NOTE — NC FL2 (Signed)
[place] LEVEL OF CARE SCREENING TOOL     IDENTIFICATION  Patient Name: Ronald Simpson Birthdate: 12-04-1945 Sex: male Admission Date (Current Location): 08/26/2016  [facility] and [place] Number:  Herbalist and Address:  [facility], [place] [address] [place], [place] [address]      Provider Number: [id number]  Attending Physician Name and Address:  Mariel Aloe, MD  Relative Name and Phone Number:       Current Level of Care: Hospital Recommended Level of Care: [place] Prior Approval Number:    Date Approved/Denied:   PASRR Number: [id number]  Discharge Plan: SNF    Current Diagnoses: Patient Active Problem List   Diagnosis Date Noted  . Respiratory failure with hypoxia ([place]) 08/27/2016  . Acute on chronic systolic heart failure ([place])   . Acute respiratory failure ([place])   . Septic shock ([place])   . Chronic bronchitis ([place]) 08/21/2016  . Positive ANA (antinuclear antibody) 08/21/2016  . Pericarditis 08/12/2016  . Acute on chronic combined systolic and diastolic CHF (congestive heart failure) ([place]) 08/05/2016  . Acute kidney injury superimposed on chronic kidney disease ([place]) 08/05/2016  . Chest pain 08/02/2016  . Pleuritic chest pain 08/02/2016  . Pleural effusion, bilateral 08/02/2016  . Elevated brain natriuretic peptide (BNP) level 08/02/2016  . Chronic anemia 07/22/2016  . Supratherapeutic international normalized ratio (INR) 07/17/2016  . Ascites 06/27/2016  . Chronic anticoagulation   . Cirrhosis ([place])   . Acute GI bleeding 05/15/2016  . Melanotic stools 05/15/2016  . Altered mental status 04/10/2016  . Gangrene of toe of left foot ([place])   . Cystitis   . Gangrene associated with diabetes mellitus ([place]) 04/02/2016  . At risk for adverse drug event 02/14/2016  . Peripheral neuropathy 01/31/2016  . Scrotal edema 01/15/2016  . Dry gangrene ([place]) 01/15/2016  . Renal insufficiency 01/15/2016   . PAD (peripheral artery disease) ([place]) 01/11/2016  . 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 Tobacco abuse   . Tachycardia   . Hyponatremia   . Hypoalbuminemia   . MRSA cellulitis of left foot   . Serratia marcescens infection ([place])   . Escherichia coli infection   . CAD in native artery   . Paroxysmal atrial fibrillation (HCC)   . Uncontrolled type 2 diabetes mellitus with complication ([place])   . Hepatitis C without hepatic coma 12/27/2015  . Coronary artery disease due to lipid rich plaque 12/27/2015  . Diabetes mellitus with peripheral vascular disease ([place]) 12/27/2015  . History of CVA (cerebrovascular accident) 12/27/2015  . AKI (acute kidney injury) ([place]) 12/27/2015  . Hypothyroidism, acquired 12/27/2015  . Benign essential HTN 12/27/2015  . Sepsis ([place]) 12/26/2015    Orientation RESPIRATION BLADDER Height & Weight     Self, Time, Situation, Place  O2 (Nasal Cannula, 3L) Continent Weight: 168 lb 14 oz (76.6 kg) Height:  _0  (172.7 cm)  BEHAVIORAL SYMPTOMS/MOOD NEUROLOGICAL BOWEL NUTRITION STATUS      Incontinent  (Please see d/c summary)  AMBULATORY STATUS COMMUNICATION OF NEEDS Skin   Extensive Assist Verbally Surgical wounds (Left foot/toe, gangreen, diabetic ulcer, gauze dressing)                       Personal Care Assistance Level of Assistance  Bathing, Feeding, Dressing Bathing Assistance: Maximum assistance Feeding assistance: Limited assistance Dressing Assistance: Maximum assistance     Functional Limitations  Info  Sight, Hearing, Speech Sight Info: Adequate Hearing Info: Adequate Speech Info: Adequate    SPECIAL CARE FACTORS FREQUENCY  PT (By licensed PT), OT (By licensed OT)     PT Frequency: 2x/week OT Frequency: 2x/week            Contractures Contractures Info: Not present    Additional Factors Info  Code Status, Allergies Code Status Info: Full Allergies Info:  Hydralazine, Percocet Oxycodone-acetaminophen, Tramadol           Current Medications (09/02/2016):  This is the current hospital active medication list Current Facility-Administered Medications  Medication Dose Route Frequency Provider Last Rate Last Dose  . 0.9 %  sodium chloride infusion  250 mL Intravenous PRN Shearon Stalls, Rahul P, PA-C 10 mL/hr at 09/01/16 1900 250 mL at 09/01/16 1900  . amoxicillin-clavulanate (AUGMENTIN) 875-125 MG per tablet 1 tablet  1 tablet Oral Q12H Mariel Aloe, MD   1 tablet at 09/02/16 0944  . aspirin chewable tablet 81 mg  81 mg Per Tube Daily Desai, Rahul P, PA-C   81 mg at 09/02/16 0942  . atorvastatin (LIPITOR) tablet 20 mg  20 mg Per Tube q1800 Desai, Rahul P, PA-C   20 mg at 09/01/16 1754  . carvedilol (COREG) tablet 3.125 mg  3.125 mg Oral BID WC Larey Dresser, MD   3.125 mg at 09/02/16 0943  . chlorhexidine gluconate (MEDLINE KIT) (PERIDEX) 0.12 % solution 15 mL  15 mL Mouth Rinse BID Simonne Maffucci B, MD   15 mL at 09/02/16 0800  . collagenase (SANTYL) ointment   Topical Daily Simonne Maffucci B, MD      . diclofenac sodium (VOLTAREN) 1 % transdermal gel 2 g  2 g Topical QID PRN Mariel Aloe, MD   2 g at 09/02/16 0342  . doxycycline (VIBRA-TABS) tablet 100 mg  100 mg Oral Q12H Mariel Aloe, MD   100 mg at 09/02/16 0943  . furosemide (LASIX) injection 80 mg  80 mg Intravenous BID Larey Dresser, MD   80 mg at 09/02/16 0942  . heparin injection 5,000 Units  5,000 Units Subcutaneous Q8H Desai, Rahul P, PA-C   5,000 Units at 09/02/16 1751  . HYDROcodone-acetaminophen (NORCO/VICODIN) 5-325 MG per tablet 1 tablet  1 tablet Oral Q6H PRN Mariel Aloe, MD   1 tablet at 09/02/16 0352  . insulin aspart (novoLOG) injection 0-5 Units  0-5 Units Subcutaneous QHS Cordelia Poche A, MD      . insulin aspart (novoLOG) injection 0-9 Units  0-9 Units Subcutaneous TID WC Mariel Aloe, MD   2 Units at 09/02/16 [phone]  . ipratropium-albuterol (DUONEB) 0.5-2.5 (3)  MG/3ML nebulizer solution 3 mL  3 mL Nebulization TID Magdalen Spatz, NP   3 mL at 09/02/16 0943  . levothyroxine (SYNTHROID, LEVOTHROID) tablet 25 mcg  25 mcg Oral QAC breakfast Mannam, Praveen, MD   25 mcg at 09/02/16 0943  . milrinone (PRIMACOR) 20 MG/100 ML (0.2 mg/mL) infusion  0.25 mcg/kg/min Intravenous Continuous Kris Mouton, RPH 5.7 mL/hr at 09/02/16 0950 0.25 mcg/kg/min at 09/02/16 0950  . mupirocin ointment (BACTROBAN) 2 %   Nasal BID Simonne Maffucci B, MD      . pantoprazole (PROTONIX) EC tablet 40 mg  40 mg Oral QHS Mannam, Praveen, MD   40 mg at 09/01/16 2152  . polyethylene glycol (MIRALAX / GLYCOLAX) packet 17 g  17 g Oral Daily Mariel Aloe, MD   17 g at 09/02/16  4136  . senna (SENOKOT) tablet 8.6 mg  1 tablet Oral Daily Mariel Aloe, MD   8.6 mg at 09/02/16 0943  . sertraline (ZOLOFT) tablet 25 mg  25 mg Oral Daily Mannam, Praveen, MD   25 mg at 09/02/16 0943  . spironolactone (ALDACTONE) tablet 25 mg  25 mg Oral Daily Shirley Friar, PA-C   25 mg at 09/02/16 4383     Discharge Medications: Please see discharge summary for a list of discharge medications.  Relevant Imaging Results:  Relevant Lab Results:   Additional Information SSN: [id number]  Eileen Stanford, LCSW

## 2016-09-03 ENCOUNTER — Encounter (HOSPITAL_COMMUNITY): Payer: Self-pay | Admitting: Family Medicine

## 2016-09-03 DIAGNOSIS — J9621 Acute and chronic respiratory failure with hypoxia: Secondary | ICD-10-CM

## 2016-09-03 LAB — COOXEMETRY PANEL
CARBOXYHEMOGLOBIN: 1 % (ref 0.5–1.5)
Carboxyhemoglobin: 1.5 % (ref 0.5–1.5)
METHEMOGLOBIN: 1.2 % (ref 0.0–1.5)
Methemoglobin: 0.9 % (ref 0.0–1.5)
O2 SAT: 59.3 %
O2 Saturation: 47.8 %
TOTAL HEMOGLOBIN: 10.5 g/dL — AB (ref 12.0–16.0)
Total hemoglobin: 10.2 g/dL — ABNORMAL LOW (ref 12.0–16.0)

## 2016-09-03 LAB — CBC
HCT: 31.6 % — ABNORMAL LOW (ref 39.0–52.0)
Hemoglobin: 9.6 g/dL — ABNORMAL LOW (ref 13.0–17.0)
MCH: 26 pg (ref 26.0–34.0)
MCHC: 30.4 g/dL (ref 30.0–36.0)
MCV: 85.6 fL (ref 78.0–100.0)
PLATELETS: 230 10*3/uL (ref 150–400)
RBC: 3.69 MIL/uL — ABNORMAL LOW (ref 4.22–5.81)
RDW: 18.5 % — ABNORMAL HIGH (ref 11.5–15.5)
WBC: 6 10*3/uL (ref 4.0–10.5)

## 2016-09-03 LAB — BASIC METABOLIC PANEL
ANION GAP: 5 (ref 5–15)
BUN: 39 mg/dL — AB (ref 6–20)
CALCIUM: 8.6 mg/dL — AB (ref 8.9–10.3)
CO2: 28 mmol/L (ref 22–32)
Chloride: 99 mmol/L — ABNORMAL LOW (ref 101–111)
Creatinine, Ser: 1.25 mg/dL — ABNORMAL HIGH (ref 0.61–1.24)
GFR calc Af Amer: >60 mL/min (ref >60)
GFR, EST NON AFRICAN AMERICAN: 57 mL/min — AB (ref >60)
GLUCOSE: 138 mg/dL — AB (ref 65–99)
Potassium: 4.5 mmol/L (ref 3.5–5.1)
SODIUM: 132 mmol/L — AB (ref 135–145)

## 2016-09-03 LAB — GLUCOSE, CAPILLARY: Glucose-Capillary: 132 mg/dL — ABNORMAL HIGH (ref 65–99)

## 2016-09-03 LAB — CULTURE, BODY FLUID-BOTTLE

## 2016-09-03 LAB — CULTURE, BODY FLUID W GRAM STAIN -BOTTLE: Culture: NO GROWTH

## 2016-09-03 MED ORDER — DIGOXIN 125 MCG PO TABS
0.1250 mg | ORAL_TABLET | Freq: Every day | ORAL | Status: DC
  Administered 2016-09-03 – 2016-09-06 (×4): 0.125 mg via ORAL
  Filled 2016-09-03 (×4): qty 1

## 2016-09-03 MED ORDER — LOSARTAN POTASSIUM 25 MG PO TABS
25.0000 mg | ORAL_TABLET | Freq: Two times a day (BID) | ORAL | Status: DC
  Administered 2016-09-03 (×2): 25 mg via ORAL
  Filled 2016-09-03 (×2): qty 1

## 2016-09-03 NOTE — Progress Notes (Signed)
Patient continues to required max assist and encouragement to participate in activity. Non-agreeable to OOB activity at this time, tolerated some respiratory activities and long sitting in bed. Educated patient on importance of mobility. Will continue to see and progress as tolerated.    09/03/16 1600  PT Visit Information  Last PT Received On 09/03/16  Assistance Needed +2  History of Present Illness Pt is 71 yo male admitted through ED on 08/26/16 from SNF with acute encephalopathy and unresponsive. Found to be in respiratory failure with hypoxia, CHF, septic shock related to pneumonia/UTI. Pt has a past surgical wound on left LE and is R AKA. PMH significant for CAD, anemia, CHF, cirrhosis, CKD3, CVA, DM2, Hep C, PAF, neuropathy, PVD.    Subjective Data  Subjective patient not agreeable to OOB activity at this time  Patient Stated Goal Feel better  Precautions  Precautions Fall  Restrictions  Weight Bearing Restrictions No  Other Position/Activity Restrictions R AKA, L toe amputations  Pain Assessment  Faces Pain Scale 0  Pain Location All over  Pain Descriptors / Indicators Aching  Cognition  Arousal/Alertness Awake/alert  Behavior During Therapy Flat affect  Overall Cognitive Status Within Functional Limits for tasks assessed  Bed Mobility  Overal bed mobility Needs Assistance  Bed Mobility Supine to Sit;Sit to Supine  General bed mobility comments max assist with use of bed to come to upright in long sitting. tolerated 6 minutes in this position before requesting to be lowered back to flat position.  Other Exercises  Other Exercises performed long sit in bed with cues for upright positioning.  Other Exercises Educated and performed IS and flutter.  Other Exercises Discussed importance of OOB activity.  Other Exercises attempted LE activity however patient deferring due to pain in foot at this time.   PT - End of Session  Equipment Utilized During Treatment Oxygen  Activity  Tolerance Patient tolerated treatment well  Patient left in bed;with call bell/phone within reach;with bed alarm set  Nurse Communication Mobility status  PT - Assessment/Plan  PT Plan Current plan remains appropriate  PT Visit Diagnosis Other abnormalities of gait and mobility (R26.89);Muscle weakness (generalized) (M62.81)  PT Frequency (ACUTE ONLY) Min 2X/week  Follow Up Recommendations SNF;Supervision for mobility/OOB  PT equipment None recommended by PT  AM-PAC PT "6 Clicks" Daily Activity Outcome Measure  Difficulty turning over in bed (including adjusting bedclothes, sheets and blankets)? 1  Difficulty moving from lying on back to sitting on the side of the bed?  1  Difficulty sitting down on and standing up from a chair with arms (e.g., wheelchair, bedside commode, etc,.)? 1  Help needed moving to and from a bed to chair (including a wheelchair)? 1  Help needed walking in hospital room? 1  Help needed climbing 3-5 steps with a railing?  1  6 Click Score 6  Mobility G Code  CN  PT Goal Progression  Progress towards PT goals Not progressing toward goals - comment (self limiting during session)  Acute Rehab PT Goals  PT Goal Formulation With patient  Time For Goal Achievement 09/14/16  Potential to Achieve Goals Fair  PT Time Calculation  PT Start Time (ACUTE ONLY) 1603  PT Stop Time (ACUTE ONLY) 1615  PT Time Calculation (min) (ACUTE ONLY) 12 min  PT General Charges  $$ ACUTE PT VISIT 1 Procedure  PT Treatments  $Therapeutic Activity 8-22 mins    Charlotte Crumbevon Emerson Barretto, PT DPT  [phone]

## 2016-09-03 NOTE — Care Management Note (Signed)
Case Management Note Original Note Created by; Donn PieriniKristi Webster RN, BSN Unit 2W-Case Manager-- 2H coverage [phone]  Patient Details  Name: Ronald Simpson MRN: [id number] Date of Birth: 1945/05/13  Subjective/Objective:  Pt admitted with acute encephalopathy/pulmonary edema- intubated in the ED- on Vent.                  Action/Plan: [facility]- CSW consulted-   Expected Discharge Date:                  Expected Discharge Plan:  Skilled Nursing Facility  In-House Referral:  Clinical Social Work  Discharge planning Services  CM Consult  Post Acute Care Choice:    Choice offered to:     DME Arranged:    DME Agency:     HH Arranged:    HH Agency:     Status of Service:  In process, will continue to follow  If discussed at Long Length of Stay Meetings, dates discussed:    Discharge Disposition:   Additional Comments: 09/03/2016 [facility]  Pt is currently on both Milrinone and IV lasix.  Current plan is to wean off Milrinone; per attendings note pt is not candidate for LVAD nor home milrinone.  Pt is from SNF - CSW consulted for discharge planning Cherylann ParrClaxton, Daci Stubbe S, RN 09/03/2016, 10:23 AM

## 2016-09-03 NOTE — Progress Notes (Addendum)
PROGRESS NOTE    Draeden Kellman  LPF:[id number] DOB: 06-07-1945 DOA: 08/26/2016 PCP: Hendricks Limes, MD   Brief Narrative: Ronald Simpson is a 71 y.o. male with a past mental history significant of chronic anemia, chronic combined systolic and diastolic heart failure, cirrhosis, HCV, CKD stage III, CVA, diabetes mellitus, insulin-dependent diabetes mellitus, paroxysmal atrial fibrillation patient presented with altered mental status found to have cardiogenic and septic shock secondary to pneumoniae UTI in addition to CHF exacerbation. Patient required intubation and pressors for support. He was weaned off of pressors and extubated on May 17. He has been receiving diuresis for his heart failure exacerbation with improvement in respiratory status. He has been having pleural effusions and had thoracentesis performed on 5/19. Continuing aggressive diuresis with the help of the heart failure team.  Assessment & Plan:   Active Problems:   Respiratory failure with hypoxia (HCC)   Acute on chronic systolic heart failure (HCC)   Acute respiratory failure (HCC)   Septic shock (HCC)   Cardiogenic shock (HCC)   Acute respiratory failure with hypoxia Multifactorial from pleural effusions and pneumonia. Extubated on 5/17. S/p thoracentesis on 5/19 with 1.2L out. Initial studies suggest exudative fluid. Symptoms improved. Chest x-ray with continued effusions -continue oxygen therapy -fluid culture (no growth x4 days)/cytology pending -treat pneumonia -PT recommendations: SNF  Acute on chronic systolic heart failure Ischemic cardiomyopathy EF of 35-40% with diffuse hypokinesis. Improvement with lasix therapy. -cardiology recommendations: lasix, milronone, CVP  Pleural effusions S/p thoracentesis on 5/19. Fluid study suggests exudative. -cytology pending as above  Sepsis secondary to pneumonia/UTI Septic shock Sepsis physiology resolved. Urine significant for yeast. Will cover for  aspiration/MRSA. -continue Doxycycline -completed fluconazole  Elevated troponin Likely secondary to CHF exacerbation. Flat trend.  Cirrhosis Chronic hepatitis C Stable. Compensated.  Chronic anemia Secondary to chronic disease. Stable. No evidence of bleeding.  Left foot surgical wound Currently wrapped. Afebrile.  Continue local wound care per nursing staff.  -continue Norco prn  Hypothyroidism -stable, continue synthroid  Diabetes Mellitus type 2 with complications - well controlled on supplemental sliding scale, following CBG (last 3)   Recent Labs  09/02/16 0904 09/02/16 1222 09/02/16 1645  GLUCAP 160* 163* 171*   Depression -Continue Zoloft  Acute kidney injury Baseline appears to be 1.1. Creatinine trending down and close to baseline.  Continues to improve with diuresis. -Monitoring closely -diuresis per cardiology  DVT prophylaxis: Heparin subcutaneous Code Status: Full code Family Communication: None at bedside Disposition Plan: Discharge to SNF   Consultants:   Cardiology  Procedures:   Thoracentesis (5/19)  Antimicrobials:  Vancomycin  Zosyn  Augmentin (5/20>>5/23)  Doxycycline (5/20>>  Fluconazole (5/18>>5/22)  Subjective: Pt says that he is feeling much better today.  No CP.  No SOB.   Objective: Vitals:   09/03/16 0200 09/03/16 0300 09/03/16 0400 09/03/16 0500  BP: 137/84 (!) 146/86 (!) 150/88 (!) 154/98  Pulse: 84 84 87 86  Resp: 11 11 14 12   Temp:   97.8 F (36.6 C)   TempSrc:   Oral   SpO2: 98% 99% 98% 100%  Weight:    75.2 kg (165 lb 12.6 oz)  Height:        Intake/Output Summary (Last 24 hours) at 09/03/16 0811 Last data filed at 09/03/16 0500  Gross per 24 hour  Intake            215.4 ml  Output             1250 ml  Net          -  1034.6 ml   Filed Weights   09/01/16 0628 09/02/16 0351 09/03/16 0500  Weight: 76.6 kg (168 lb 14 oz) 76.6 kg (168 lb 14 oz) 75.2 kg (165 lb 12.6 oz)    Examination:  General  exam: Appears calm and comfortable Respiratory system: No crackles heard. Respiratory effort normal. Cardiovascular system: S1 & S2 heard. No murmurs. Gastrointestinal system: Abdomen is nondistended, soft and nontender. Hypoactive bowel sounds heard. Central nervous system: Alert and oriented. No focal neurological deficits. Extremities: Trace edema thigh edema. No calf tenderness. Right AKA. Left foot wounds clean and dry.  Skin: No cyanosis. No rashes Psychiatry: Judgement and insight appear normal. Mood & affect appropriate. No agitation.   Data Reviewed: I have personally reviewed following labs and imaging studies  CBC:  Recent Labs Lab 08/30/16 0500 08/31/16 0551 09/01/16 0614 09/02/16 0531 09/03/16 0450  WBC 11.8* 8.8 8.1 7.3 6.0  HGB 10.0* 9.4* 8.8* 9.5* 9.6*  HCT 32.0* 30.3* 28.8* 30.9* 31.6*  MCV 84.4 85.1 84.7 84.9 85.6  PLT 212 206 196 218 021   Basic Metabolic Panel:  Recent Labs Lab 08/28/16 0500 08/29/16 0455 08/30/16 0500 08/31/16 0551 09/01/16 0614 09/02/16 0531 09/03/16 0450  NA 129* 131* 130* 128* 129* 131* 132*  K 4.0 4.0 3.8 3.4* 3.1* 4.0 4.5  CL 97* 96* 96* 95* 97* 97* 99*  CO2 23 25 24 24 25 26 28   GLUCOSE 144* 121* 142* 167* 164* 160* 138*  BUN 41* 42* 47* 49* 46* 43* 39*  CREATININE 1.51* 1.53* 1.77* 1.90* 1.61* 1.36* 1.25*  CALCIUM 8.2* 8.6* 8.3* 8.4* 8.2* 8.6* 8.6*  MG 2.1 2.0  --   --   --   --   --   PHOS 3.8 3.5  --   --   --   --   --    GFR: Estimated Creatinine Clearance: 54.7 mL/min (A) (by C-G formula based on SCr of 1.25 mg/dL (H)). Liver Function Tests:  Recent Labs Lab 08/29/16 1144  PROT 6.8   Coagulation Profile: No results for input(s): INR, PROTIME in the last 168 hours. Cardiac Enzymes:  Recent Labs Lab 08/27/16 1120  TROPONINI 0.06*   BNP (last 3 results) No results for input(s): PROBNP in the last 8760 hours. HbA1C: No results for input(s): HGBA1C in the last 72 hours. CBG:  Recent Labs Lab  09/01/16 1752 09/01/16 2228 09/02/16 0904 09/02/16 1222 09/02/16 1645  GLUCAP 182* 166* 160* 163* 171*   Sepsis Labs:  Recent Labs Lab 08/28/16 0500 08/29/16 0455 08/31/16 0551 09/02/16 0531  PROCALCITON 10.60 6.93 2.17 0.54    Recent Results (from the past 240 hour(s))  Culture, blood (Routine x 2)     Status: None   Collection Time: 08/27/16 12:05 AM  Result Value Ref Range Status   Specimen Description BLOOD LEFT FOREARM  Final   Special Requests   Final    BOTTLES DRAWN AEROBIC AND ANAEROBIC Blood Culture adequate volume   Culture NO GROWTH 5 DAYS  Final   Report Status 09/01/2016 FINAL  Final  Culture, blood (Routine x 2)     Status: None   Collection Time: 08/27/16 12:25 AM  Result Value Ref Range Status   Specimen Description BLOOD RIGHT HAND  Final   Special Requests IN PEDIATRIC BOTTLE Blood Culture adequate volume  Final   Culture NO GROWTH 5 DAYS  Final   Report Status 09/01/2016 FINAL  Final  MRSA PCR Screening     Status: Abnormal  Collection Time: 08/27/16  4:25 AM  Result Value Ref Range Status   MRSA by PCR POSITIVE (A) NEGATIVE Final    Comment:        The GeneXpert MRSA Assay (FDA approved for NASAL specimens only), is one component of a comprehensive MRSA colonization surveillance program. It is not intended to diagnose MRSA infection nor to guide or monitor treatment for MRSA infections. RESULT CALLED TO, READ BACK BY AND VERIFIED WITH: CGerre Pebbles RN 10:05 08/27/16 ([email or web address])   Urine culture     Status: Abnormal   Collection Time: 08/27/16  6:15 AM  Result Value Ref Range Status   Specimen Description URINE, CATHETERIZED  Final   Special Requests NONE  Final   Culture 50,000 COLONIES/mL YEAST (A)  Final   Report Status 08/28/2016 FINAL  Final  Gram stain     Status: None   Collection Time: 08/29/16 11:40 AM  Result Value Ref Range Status   Specimen Description PLEURAL RIGHT  Final   Special Requests NONE  Final   Gram Stain   Final     WBC PRESENT,BOTH PMN AND MONONUCLEAR NO ORGANISMS SEEN    Report Status 08/29/2016 FINAL  Final  Culture, body fluid-bottle     Status: None (Preliminary result)   Collection Time: 08/29/16 11:40 AM  Result Value Ref Range Status   Specimen Description PLEURAL RIGHT  Final   Special Requests NONE  Final   Culture NO GROWTH 4 DAYS  Final   Report Status PENDING  Incomplete    Radiology Studies: Dg Chest Port 1 View  Result Date: 09/02/2016 CLINICAL DATA:  CHF EXAM: PORTABLE CHEST 1 VIEW COMPARISON:  08/31/2016 FINDINGS: Cardiac shadow is stable. Postsurgical changes are again seen. Right jugular central line is again noted and stable. Some improved aeration is noted in the bases bilaterally although persistent bilateral pleural effusions are seen. No pneumothorax is noted. No bony abnormality is seen. IMPRESSION: Improving aeration in the bases although persistent effusions are again seen. Electronically Signed   By: Inez Catalina M.D.   On: 09/02/2016 07:36    Scheduled Meds: . amoxicillin-clavulanate  1 tablet Oral Q12H  . aspirin  81 mg Per Tube Daily  . atorvastatin  20 mg Per Tube q1800  . carvedilol  3.125 mg Oral BID WC  . chlorhexidine gluconate (MEDLINE KIT)  15 mL Mouth Rinse BID  . collagenase   Topical Daily  . doxycycline  100 mg Oral Q12H  . furosemide  80 mg Intravenous BID  . heparin  5,000 Units Subcutaneous Q8H  . insulin aspart  0-5 Units Subcutaneous QHS  . insulin aspart  0-9 Units Subcutaneous TID WC  . ipratropium-albuterol  3 mL Nebulization TID  . levothyroxine  25 mcg Oral QAC breakfast  . losartan  25 mg Oral QHS  . mupirocin ointment   Nasal BID  . pantoprazole  40 mg Oral QHS  . polyethylene glycol  17 g Oral Daily  . senna  1 tablet Oral Daily  . sertraline  25 mg Oral Daily  . spironolactone  25 mg Oral Daily   Continuous Infusions: . sodium chloride 250 mL (09/02/16 2000)  . milrinone 0.25 mcg/kg/min (09/03/16 0804)   Critical Care Time Spent  38 mins   LOS: 7 days    Irwin Brakeman, MD [facility] 09/03/2016, 8:11 AM Pager: [phone]  If 7PM-7AM, please contact night-coverage [email or web address] Password [email or web address] 09/03/2016, 8:11 AM
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
## 2016-09-04 LAB — BASIC METABOLIC PANEL
Anion gap: 7 (ref 5–15)
BUN: 34 mg/dL — AB (ref 6–20)
CALCIUM: 8.8 mg/dL — AB (ref 8.9–10.3)
CHLORIDE: 96 mmol/L — AB (ref 101–111)
CO2: 29 mmol/L (ref 22–32)
CREATININE: 1.18 mg/dL (ref 0.61–1.24)
GFR calc Af Amer: >60 mL/min (ref >60)
GLUCOSE: 144 mg/dL — AB (ref 65–99)
POTASSIUM: 4.3 mmol/L (ref 3.5–5.1)
Sodium: 132 mmol/L — ABNORMAL LOW (ref 135–145)

## 2016-09-04 LAB — CBC
HCT: 32.4 % — ABNORMAL LOW (ref 39.0–52.0)
Hemoglobin: 9.9 g/dL — ABNORMAL LOW (ref 13.0–17.0)
MCH: 26 pg (ref 26.0–34.0)
MCHC: 30.6 g/dL (ref 30.0–36.0)
MCV: 85 fL (ref 78.0–100.0)
PLATELETS: 256 10*3/uL (ref 150–400)
RBC: 3.81 MIL/uL — ABNORMAL LOW (ref 4.22–5.81)
RDW: 18.3 % — AB (ref 11.5–15.5)
WBC: 6.7 10*3/uL (ref 4.0–10.5)

## 2016-09-04 LAB — COOXEMETRY PANEL
Carboxyhemoglobin: 1 % (ref 0.5–1.5)
Methemoglobin: 0.9 % (ref 0.0–1.5)
O2 Saturation: 61.5 %
TOTAL HEMOGLOBIN: 15.4 g/dL (ref 12.0–16.0)

## 2016-09-04 LAB — GLUCOSE, CAPILLARY: GLUCOSE-CAPILLARY: 116 mg/dL — AB (ref 65–99)

## 2016-09-04 MED ORDER — TORSEMIDE 20 MG PO TABS
60.0000 mg | ORAL_TABLET | Freq: Two times a day (BID) | ORAL | Status: DC
  Administered 2016-09-04: 60 mg via ORAL
  Filled 2016-09-04: qty 3

## 2016-09-04 MED ORDER — TORSEMIDE 20 MG PO TABS
60.0000 mg | ORAL_TABLET | Freq: Every day | ORAL | Status: DC
  Administered 2016-09-05 – 2016-09-06 (×2): 60 mg via ORAL
  Filled 2016-09-04 (×2): qty 3

## 2016-09-04 MED ORDER — LOSARTAN POTASSIUM 50 MG PO TABS
50.0000 mg | ORAL_TABLET | Freq: Two times a day (BID) | ORAL | Status: DC
  Administered 2016-09-04 (×2): 50 mg via ORAL
  Filled 2016-09-04 (×2): qty 1

## 2016-09-04 MED ORDER — VALACYCLOVIR HCL 500 MG PO TABS
1000.0000 mg | ORAL_TABLET | Freq: Two times a day (BID) | ORAL | Status: DC
  Administered 2016-09-04 – 2016-09-06 (×5): 1000 mg via ORAL
  Filled 2016-09-04 (×5): qty 2

## 2016-09-04 NOTE — Progress Notes (Signed)
PROGRESS NOTE    Ronald Simpson  ZOX:[id number] DOB: Oct 11, 1945 DOA: 08/26/2016 PCP: Hendricks Limes, MD   Brief Narrative: Ronald Simpson is a 71 y.o. male with a past mental history significant of chronic anemia, chronic combined systolic and diastolic heart failure, cirrhosis, HCV, CKD stage III, CVA, diabetes mellitus, insulin-dependent diabetes mellitus, paroxysmal atrial fibrillation patient presented with altered mental status found to have cardiogenic and septic shock secondary to pneumoniae UTI in addition to CHF exacerbation. Patient required intubation and pressors for support. He was weaned off of pressors and extubated on May 17. He has been receiving diuresis for his heart failure exacerbation with improvement in respiratory status. He has been having pleural effusions and had thoracentesis performed on 5/19. Continuing aggressive diuresis with the help of the heart failure team.  Assessment & Plan:   Active Problems:   Respiratory failure with hypoxia (HCC)   Acute on chronic systolic heart failure (HCC)   Acute respiratory failure (HCC)   Septic shock (HCC)   Cardiogenic shock (HCC)   Acute respiratory failure with hypoxia Multifactorial from pleural effusions and pneumonia. Extubated on 5/17. S/p thoracentesis on 5/19 with 1.2L out. Initial studies suggest exudative fluid. Clinically much improved. Chest x-ray with persistent effusions -continue oxygen therapy as needed.  -fluid culture (no growth x5 days)/cytology; reactive mesothelial cells, no malignant cells identified.   -treated pneumonia, complete last day of doxycycline today.  -PT recommendations: SNF  Acute on chronic systolic heart failure Ischemic cardiomyopathy EF of 35-40% with diffuse hypokinesis. Improvement with lasix therapy. -cardiology recommendations: lasix, milronone, CVP  Pleural effusions S/p thoracentesis on 5/19. Fluid study suggests exudative. -cytology:  reactive mesothelial cells, no  malignant cells identified  Sepsis secondary to pneumonia/UTI Septic shock Sepsis physiology resolved. Urine significant for yeast. Will cover for aspiration/MRSA. -continue Doxycycline -completed fluconazole  Elevated troponin Likely secondary to CHF exacerbation. Flat trend.  Ulcer on tip of penis - Question if this is a herpes lesion, will send swab to lab for culture. Start valtrex.   Cirrhosis Chronic hepatitis C Stable. Compensated.  Chronic anemia Secondary to chronic disease. Stable. No evidence of bleeding.  Left foot surgical wound Currently wrapped. Afebrile.  Continue local wound care per nursing staff.  -continue Norco prn  Hypothyroidism -stable, continue synthroid  Diabetes Mellitus type 2 with complications - well controlled on supplemental sliding scale, following CBG (last 3)   Recent Labs  09/02/16 1222 09/02/16 1645 09/03/16 2127  GLUCAP 163* 171* 132*   Depression -Continue Zoloft  Acute kidney injury Baseline appears to be 1.1. Creatinine trending down and close to baseline.  Continues to improve with diuresis. -Monitoring closely -diuresis per cardiology  DVT prophylaxis: Heparin subcutaneous Code Status: Full code Family Communication: None at bedside Disposition Plan: Discharge to SNF   Consultants:   Cardiology  Procedures:   Thoracentesis (5/19)  Antimicrobials:  Vancomycin  Zosyn  Augmentin (5/20>>5/23)  Doxycycline (5/20>>  Fluconazole (5/18>>5/22)  Subjective: Pt says that it hurts to urinate and ulcer seen on tip of penis.   Objective: Vitals:   09/03/16 2037 09/03/16 2344 09/04/16 0300 09/04/16 0318  BP: (!) 157/88 (!) 154/94 (!) 167/110 (!) 167/110  Pulse: 93 94 97 (!) 101  Resp: 15 18 18  (!) 21  Temp: 97.6 F (36.4 C) 98 F (36.7 C)  98 F (36.7 C)  TempSrc: Oral Oral  Oral  SpO2: 96% 98% 98% 100%  Weight:    74.1 kg (163 lb 5.8 oz)  Height:  Intake/Output Summary (Last 24 hours) at  09/04/16 0833 Last data filed at 09/04/16 0600  Gross per 24 hour  Intake           674.35 ml  Output             1550 ml  Net          -875.65 ml   Filed Weights   09/02/16 0351 09/03/16 0500 09/04/16 0318  Weight: 76.6 kg (168 lb 14 oz) 75.2 kg (165 lb 12.6 oz) 74.1 kg (163 lb 5.8 oz)    Examination:  General exam: Appears calm and comfortable Respiratory system: No crackles heard. Respiratory effort normal. Cardiovascular system: S1 & S2 heard. No murmurs. Gastrointestinal system: Abdomen is nondistended, soft and nontender. Hypoactive bowel sounds heard. Central nervous system: Alert and oriented. No focal neurological deficits. GU: ulcerated lesion on tip of penis at the urethral meatus.  Extremities: Trace edema thigh edema. No calf tenderness. Right AKA. Left foot wounds clean and dry.  Skin: No cyanosis. No rashes Psychiatry: Judgement and insight appear normal. Mood & affect appropriate. No agitation.   Data Reviewed: I have personally reviewed following labs and imaging studies  CBC:  Recent Labs Lab 08/31/16 0551 09/01/16 0614 09/02/16 0531 09/03/16 0450 09/04/16 0237  WBC 8.8 8.1 7.3 6.0 6.7  HGB 9.4* 8.8* 9.5* 9.6* 9.9*  HCT 30.3* 28.8* 30.9* 31.6* 32.4*  MCV 85.1 84.7 84.9 85.6 85.0  PLT 206 196 218 230 295   Basic Metabolic Panel:  Recent Labs Lab 08/29/16 0455  08/31/16 0551 09/01/16 0614 09/02/16 0531 09/03/16 0450 09/04/16 0237  NA 131*  < > 128* 129* 131* 132* 132*  K 4.0  < > 3.4* 3.1* 4.0 4.5 4.3  CL 96*  < > 95* 97* 97* 99* 96*  CO2 25  < > 24 25 26 28 29   GLUCOSE 121*  < > 167* 164* 160* 138* 144*  BUN 42*  < > 49* 46* 43* 39* 34*  CREATININE 1.53*  < > 1.90* 1.61* 1.36* 1.25* 1.18  CALCIUM 8.6*  < > 8.4* 8.2* 8.6* 8.6* 8.8*  MG 2.0  --   --   --   --   --   --   PHOS 3.5  --   --   --   --   --   --   < > = values in this interval not displayed. GFR: Estimated Creatinine Clearance: 58 mL/min (by C-G formula based on SCr of 1.18  mg/dL). Liver Function Tests:  Recent Labs Lab 08/29/16 1144  PROT 6.8   Coagulation Profile: No results for input(s): INR, PROTIME in the last 168 hours. Cardiac Enzymes: No results for input(s): CKTOTAL, CKMB, CKMBINDEX, TROPONINI in the last 168 hours. BNP (last 3 results) No results for input(s): PROBNP in the last 8760 hours. HbA1C: No results for input(s): HGBA1C in the last 72 hours. CBG:  Recent Labs Lab 09/01/16 2228 09/02/16 0904 09/02/16 1222 09/02/16 1645 09/03/16 2127  GLUCAP 166* 160* 163* 171* 132*   Sepsis Labs:  Recent Labs Lab 08/29/16 0455 08/31/16 0551 09/02/16 0531  PROCALCITON 6.93 2.17 0.54    Recent Results (from the past 240 hour(s))  Culture, blood (Routine x 2)     Status: None   Collection Time: 08/27/16 12:05 AM  Result Value Ref Range Status   Specimen Description BLOOD LEFT FOREARM  Final   Special Requests   Final    BOTTLES DRAWN AEROBIC AND ANAEROBIC Blood  Culture adequate volume   Culture NO GROWTH 5 DAYS  Final   Report Status 09/01/2016 FINAL  Final  Culture, blood (Routine x 2)     Status: None   Collection Time: 08/27/16 12:25 AM  Result Value Ref Range Status   Specimen Description BLOOD RIGHT HAND  Final   Special Requests IN PEDIATRIC BOTTLE Blood Culture adequate volume  Final   Culture NO GROWTH 5 DAYS  Final   Report Status 09/01/2016 FINAL  Final  MRSA PCR Screening     Status: Abnormal   Collection Time: 08/27/16  4:25 AM  Result Value Ref Range Status   MRSA by PCR POSITIVE (A) NEGATIVE Final    Comment:        The GeneXpert MRSA Assay (FDA approved for NASAL specimens only), is one component of a comprehensive MRSA colonization surveillance program. It is not intended to diagnose MRSA infection nor to guide or monitor treatment for MRSA infections. RESULT CALLED TO, READ BACK BY AND VERIFIED WITH: CGerre Pebbles RN 10:05 08/27/16 ([email or web address])   Urine culture     Status: Abnormal   Collection Time: 08/27/16   6:15 AM  Result Value Ref Range Status   Specimen Description URINE, CATHETERIZED  Final   Special Requests NONE  Final   Culture 50,000 COLONIES/mL YEAST (A)  Final   Report Status 08/28/2016 FINAL  Final  Gram stain     Status: None   Collection Time: 08/29/16 11:40 AM  Result Value Ref Range Status   Specimen Description PLEURAL RIGHT  Final   Special Requests NONE  Final   Gram Stain   Final    WBC PRESENT,BOTH PMN AND MONONUCLEAR NO ORGANISMS SEEN    Report Status 08/29/2016 FINAL  Final  Culture, body fluid-bottle     Status: None   Collection Time: 08/29/16 11:40 AM  Result Value Ref Range Status   Specimen Description PLEURAL RIGHT  Final   Special Requests NONE  Final   Culture NO GROWTH 5 DAYS  Final   Report Status 09/03/2016 FINAL  Final    Radiology Studies: No results found.  Scheduled Meds: . aspirin  81 mg Per Tube Daily  . atorvastatin  20 mg Per Tube q1800  . carvedilol  3.125 mg Oral BID WC  . chlorhexidine gluconate (MEDLINE KIT)  15 mL Mouth Rinse BID  . collagenase   Topical Daily  . digoxin  0.125 mg Oral Daily  . doxycycline  100 mg Oral Q12H  . heparin  5,000 Units Subcutaneous Q8H  . insulin aspart  0-5 Units Subcutaneous QHS  . insulin aspart  0-9 Units Subcutaneous TID WC  . ipratropium-albuterol  3 mL Nebulization TID  . levothyroxine  25 mcg Oral QAC breakfast  . losartan  50 mg Oral BID  . mupirocin ointment   Nasal BID  . pantoprazole  40 mg Oral QHS  . polyethylene glycol  17 g Oral Daily  . senna  1 tablet Oral Daily  . sertraline  25 mg Oral Daily  . spironolactone  25 mg Oral Daily  . torsemide  60 mg Oral BID   Continuous Infusions: . sodium chloride 250 mL (09/04/16 0423)  . milrinone 0.125 mcg/kg/min (09/04/16 0800)   Critical Care Time Spent 34 mins   LOS: 8 days    Irwin Brakeman, MD [facility] 09/04/2016, 8:33 AM Pager: [phone]  If 7PM-7AM, please contact night-coverage [email or web address] Password  [facility] 09/04/2016, 8:33 AM
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
## 2016-09-04 NOTE — Progress Notes (Signed)
CVP set up provided to PT bedside- RN aware. 

## 2016-09-04 NOTE — Progress Notes (Signed)
SLP Cancellation Note  Patient Details Name: Ronald Simpson MRN: [id number] DOB: 12/24/1945   Cancelled treatment:       Reason Eval/Treat Not Completed: Patient declined, no reason specified - pt refused all PO despite encouragement and education offered by SLP. Will f/u as able.   Maxcine Hamaiewonsky, Varonica Siharath 09/04/2016, 3:02 PM  Maxcine HamLaura Paiewonsky, M.A. CCC-SLP [phone]

## 2016-09-05 DIAGNOSIS — Z9889 Other specified postprocedural states: Secondary | ICD-10-CM

## 2016-09-05 LAB — BASIC METABOLIC PANEL
ANION GAP: 9 (ref 5–15)
BUN: 34 mg/dL — ABNORMAL HIGH (ref 6–20)
CALCIUM: 9.1 mg/dL (ref 8.9–10.3)
CO2: 31 mmol/L (ref 22–32)
Chloride: 92 mmol/L — ABNORMAL LOW (ref 101–111)
Creatinine, Ser: 1.22 mg/dL (ref 0.61–1.24)
GFR, EST NON AFRICAN AMERICAN: 59 mL/min — AB (ref >60)
Glucose, Bld: 201 mg/dL — ABNORMAL HIGH (ref 65–99)
Potassium: 4 mmol/L (ref 3.5–5.1)
SODIUM: 132 mmol/L — AB (ref 135–145)

## 2016-09-05 LAB — COOXEMETRY PANEL
CARBOXYHEMOGLOBIN: 1.9 % — AB (ref 0.5–1.5)
Carboxyhemoglobin: 1.3 % (ref 0.5–1.5)
METHEMOGLOBIN: 0.7 % (ref 0.0–1.5)
Methemoglobin: 0.9 % (ref 0.0–1.5)
O2 SAT: 94.9 %
O2 Saturation: 50.3 %
TOTAL HEMOGLOBIN: 11.5 g/dL — AB (ref 12.0–16.0)
TOTAL HEMOGLOBIN: 11.6 g/dL — AB (ref 12.0–16.0)

## 2016-09-05 LAB — GLUCOSE, CAPILLARY
GLUCOSE-CAPILLARY: 143 mg/dL — AB (ref 65–99)
Glucose-Capillary: 158 mg/dL — ABNORMAL HIGH (ref 65–99)

## 2016-09-05 LAB — CBC
HCT: 34.6 % — ABNORMAL LOW (ref 39.0–52.0)
HEMOGLOBIN: 10.8 g/dL — AB (ref 13.0–17.0)
MCH: 26.4 pg (ref 26.0–34.0)
MCHC: 31.2 g/dL (ref 30.0–36.0)
MCV: 84.6 fL (ref 78.0–100.0)
Platelets: 274 10*3/uL (ref 150–400)
RBC: 4.09 MIL/uL — AB (ref 4.22–5.81)
RDW: 18 % — ABNORMAL HIGH (ref 11.5–15.5)
WBC: 8.1 10*3/uL (ref 4.0–10.5)

## 2016-09-05 MED ORDER — SACUBITRIL-VALSARTAN 24-26 MG PO TABS: 1.0000 | ORAL_TABLET | Freq: Two times a day (BID) | ORAL | Status: DC

## 2016-09-05 MED ORDER — SACUBITRIL-VALSARTAN 49-51 MG PO TABS
1.0000 | ORAL_TABLET | Freq: Two times a day (BID) | ORAL | Status: DC
  Filled 2016-09-05: qty 1

## 2016-09-05 MED ORDER — SACUBITRIL-VALSARTAN 49-51 MG PO TABS
1.0000 | ORAL_TABLET | Freq: Two times a day (BID) | ORAL | Status: DC
  Administered 2016-09-05 – 2016-09-06 (×3): 1 via ORAL
  Filled 2016-09-05 (×3): qty 1

## 2016-09-05 MED ORDER — CARVEDILOL 6.25 MG PO TABS
6.2500 mg | ORAL_TABLET | Freq: Two times a day (BID) | ORAL | Status: DC
  Administered 2016-09-05 – 2016-09-06 (×2): 6.25 mg via ORAL
  Filled 2016-09-05 (×2): qty 1

## 2016-09-05 NOTE — Progress Notes (Signed)
SLP Cancellation Note  Patient Details Name: [name] Augenstein MRN: [id number] DOB: [birth year]   Cancelled treatment:       Reason Eval/Treat Not Completed: Patient declined, no reason specified - pt again declined POs today despite encouragement.  Will follow   Blenda MountsCouture, Talaysha Freeberg Laurice 09/05/2016, 10:26 AM

## 2016-09-05 NOTE — Progress Notes (Signed)
PROGRESS NOTE    Ronald Simpson  MKL:[id number] DOB: 1945-07-10 DOA: 08/26/2016 PCP: Hendricks Limes, MD   Brief Narrative: Ronald Simpson is a 71 y.o. male with a past mental history significant of chronic anemia, chronic combined systolic and diastolic heart failure, cirrhosis, HCV, CKD stage III, CVA, diabetes mellitus, insulin-dependent diabetes mellitus, paroxysmal atrial fibrillation patient presented with altered mental status found to have cardiogenic and septic shock secondary to pneumoniae UTI in addition to CHF exacerbation. Patient required intubation and pressors for support. He was weaned off of pressors and extubated on May 17. He has been receiving diuresis for his heart failure exacerbation with improvement in respiratory status. He has been having pleural effusions and had thoracentesis performed on 5/19. Continuing aggressive diuresis with the help of the heart failure team.  Assessment & Plan:   Active Problems:   Respiratory failure with hypoxia (HCC)   Acute on chronic systolic heart failure (HCC)   Acute respiratory failure (HCC)   Septic shock (HCC)   Cardiogenic shock (HCC)   Acute respiratory failure with hypoxia Multifactorial from pleural effusions and pneumonia. Extubated on 5/17. S/p thoracentesis on 5/19 with 1.2L out. Initial studies suggest exudative fluid. Clinically much improved. Chest x-ray with persistent effusions -continue oxygen therapy as needed.  -fluid culture (no growth x5 days)/cytology; reactive mesothelial cells, no malignant cells identified.   -treated pneumonia, completed doxycycline -PT recommendations: SNF  Acute on chronic systolic heart failure Ischemic cardiomyopathy EF of 35-40% with diffuse hypokinesis. Improvement with lasix therapy. -cardiology recommendations: off milrinone, transfer to tele 5/25, start entresto 5/25  Pleural effusions S/p thoracentesis on 5/19. Fluid study suggests exudative. -cytology:  reactive  mesothelial cells, no malignant cells identified  Sepsis secondary to pneumonia/UTI Septic shock Sepsis physiology resolved. Urine significant for yeast. Will cover for aspiration/MRSA. -completed Doxycycline -completed fluconazole  Elevated troponin Likely secondary to CHF exacerbation. Flat trend.  Ulcer on tip of penis - Question if this is a herpes lesion, herpes testing pending. Continue valtrex.   Cirrhosis Chronic hepatitis C Stable. Compensated.  Chronic anemia Secondary to chronic disease. Stable. No evidence of bleeding.  Left foot surgical wound Currently wrapped. Afebrile.  Continue local wound care per nursing staff.  -continue Norco prn  Hypothyroidism -stable, continue synthroid  Diabetes Mellitus type 2 with complications - well controlled on supplemental sliding scale, following CBG (last 3)   Recent Labs  09/02/16 1645 09/03/16 2127 09/04/16 1615  GLUCAP 171* 132* 116*   Depression -Continue Zoloft  Acute kidney injury Baseline appears to be 1.1. Creatinine trending down and close to baseline.  Continues to improve with diuresis. -Monitoring closely -diuresis per cardiology  DVT prophylaxis: Heparin subcutaneous Code Status: Full code Family Communication: None at bedside Disposition Plan: Discharge to SNF in 1-2 days  Consultants:   Cardiology  Procedures:   Thoracentesis (5/19)  Antimicrobials:  Vancomycin  Zosyn  Augmentin (5/20>>5/23)  Doxycycline (5/20>>  Fluconazole (5/18>>5/22)  Valtrex (5/24  Subjective: Pt says that he does feel better, still pain with urination.   Objective: Vitals:   09/05/16 0600 09/05/16 0800 09/05/16 0822 09/05/16 0920  BP: (!) 172/117 (!) 153/99 (!) 152/107   Pulse: (!) 104 95 99   Resp: 16 11 14    Temp:   98.1 F (36.7 C)   TempSrc:   Oral   SpO2: 99% 100% 99% 100%  Weight:      Height:        Intake/Output Summary (Last 24 hours) at 09/05/16 6979 Last data  filed at 09/05/16  3748  Gross per 24 hour  Intake              462 ml  Output             1025 ml  Net             -563 ml   Filed Weights   09/03/16 0500 09/04/16 0318 09/05/16 0452  Weight: 75.2 kg (165 lb 12.6 oz) 74.1 kg (163 lb 5.8 oz) 72.4 kg (159 lb 9.8 oz)    Examination:  General exam: Appears calm and comfortable Respiratory system: No crackles heard. Respiratory effort normal. Cardiovascular system: S1 & S2 heard. No murmurs. Gastrointestinal system: Abdomen is nondistended, soft and nontender. Hypoactive bowel sounds heard. Central nervous system: Alert and oriented. No focal neurological deficits. GU: ulcerated lesion on tip of penis at the urethral meatus unchanged.  Extremities: Trace edema thigh edema. No calf tenderness. Right AKA. Left foot wounds clean and dry.  Skin: No cyanosis. No rashes Psychiatry: Judgement and insight appear normal. Mood & affect appropriate. No agitation.   Data Reviewed: I have personally reviewed following labs and imaging studies  CBC:  Recent Labs Lab 09/01/16 0614 09/02/16 0531 09/03/16 0450 09/04/16 0237 09/05/16 0500  WBC 8.1 7.3 6.0 6.7 8.1  HGB 8.8* 9.5* 9.6* 9.9* 10.8*  HCT 28.8* 30.9* 31.6* 32.4* 34.6*  MCV 84.7 84.9 85.6 85.0 84.6  PLT 196 218 230 256 270   Basic Metabolic Panel:  Recent Labs Lab 09/01/16 0614 09/02/16 0531 09/03/16 0450 09/04/16 0237 09/05/16 0500  NA 129* 131* 132* 132* 132*  K 3.1* 4.0 4.5 4.3 4.0  CL 97* 97* 99* 96* 92*  CO2 25 26 28 29 31   GLUCOSE 164* 160* 138* 144* 201*  BUN 46* 43* 39* 34* 34*  CREATININE 1.61* 1.36* 1.25* 1.18 1.22  CALCIUM 8.2* 8.6* 8.6* 8.8* 9.1   GFR: Estimated Creatinine Clearance: 56.1 mL/min (by C-G formula based on SCr of 1.22 mg/dL). Liver Function Tests:  Recent Labs Lab 08/29/16 1144  PROT 6.8   Coagulation Profile: No results for input(s): INR, PROTIME in the last 168 hours. Cardiac Enzymes: No results for input(s): CKTOTAL, CKMB, CKMBINDEX, TROPONINI in the  last 168 hours. BNP (last 3 results) No results for input(s): PROBNP in the last 8760 hours. HbA1C: No results for input(s): HGBA1C in the last 72 hours. CBG:  Recent Labs Lab 09/02/16 0904 09/02/16 1222 09/02/16 1645 09/03/16 2127 09/04/16 1615  GLUCAP 160* 163* 171* 132* 116*   Sepsis Labs:  Recent Labs Lab 08/31/16 0551 09/02/16 0531  PROCALCITON 2.17 0.54    Recent Results (from the past 240 hour(s))  Culture, blood (Routine x 2)     Status: None   Collection Time: 08/27/16 12:05 AM  Result Value Ref Range Status   Specimen Description BLOOD LEFT FOREARM  Final   Special Requests   Final    BOTTLES DRAWN AEROBIC AND ANAEROBIC Blood Culture adequate volume   Culture NO GROWTH 5 DAYS  Final   Report Status 09/01/2016 FINAL  Final  Culture, blood (Routine x 2)     Status: None   Collection Time: 08/27/16 12:25 AM  Result Value Ref Range Status   Specimen Description BLOOD RIGHT HAND  Final   Special Requests IN PEDIATRIC BOTTLE Blood Culture adequate volume  Final   Culture NO GROWTH 5 DAYS  Final   Report Status 09/01/2016 FINAL  Final  MRSA PCR Screening  Status: Abnormal   Collection Time: 08/27/16  4:25 AM  Result Value Ref Range Status   MRSA by PCR POSITIVE (A) NEGATIVE Final    Comment:        The GeneXpert MRSA Assay (FDA approved for NASAL specimens only), is one component of a comprehensive MRSA colonization surveillance program. It is not intended to diagnose MRSA infection nor to guide or monitor treatment for MRSA infections. RESULT CALLED TO, READ BACK BY AND VERIFIED WITH: CGerre Pebbles RN 10:05 08/27/16 ([email or web address])   Urine culture     Status: Abnormal   Collection Time: 08/27/16  6:15 AM  Result Value Ref Range Status   Specimen Description URINE, CATHETERIZED  Final   Special Requests NONE  Final   Culture 50,000 COLONIES/mL YEAST (A)  Final   Report Status 08/28/2016 FINAL  Final  Gram stain     Status: None   Collection Time: 08/29/16  11:40 AM  Result Value Ref Range Status   Specimen Description PLEURAL RIGHT  Final   Special Requests NONE  Final   Gram Stain   Final    WBC PRESENT,BOTH PMN AND MONONUCLEAR NO ORGANISMS SEEN    Report Status 08/29/2016 FINAL  Final  Culture, body fluid-bottle     Status: None   Collection Time: 08/29/16 11:40 AM  Result Value Ref Range Status   Specimen Description PLEURAL RIGHT  Final   Special Requests NONE  Final   Culture NO GROWTH 5 DAYS  Final   Report Status 09/03/2016 FINAL  Final    Radiology Studies: No results found.  Scheduled Meds: . aspirin  81 mg Per Tube Daily  . atorvastatin  20 mg Per Tube q1800  . carvedilol  6.25 mg Oral BID WC  . chlorhexidine gluconate (MEDLINE KIT)  15 mL Mouth Rinse BID  . collagenase   Topical Daily  . digoxin  0.125 mg Oral Daily  . heparin  5,000 Units Subcutaneous Q8H  . insulin aspart  0-5 Units Subcutaneous QHS  . insulin aspart  0-9 Units Subcutaneous TID WC  . ipratropium-albuterol  3 mL Nebulization TID  . levothyroxine  25 mcg Oral QAC breakfast  . mupirocin ointment   Nasal BID  . pantoprazole  40 mg Oral QHS  . polyethylene glycol  17 g Oral Daily  . sacubitril-valsartan  1 tablet Oral BID  . senna  1 tablet Oral Daily  . sertraline  25 mg Oral Daily  . spironolactone  25 mg Oral Daily  . torsemide  60 mg Oral Daily  . valACYclovir  1,000 mg Oral BID   Continuous Infusions: . sodium chloride Stopped (09/04/16 2000)   Critical Care Time Spent 33 mins   LOS: 9 days   Irwin Brakeman, MD [facility] 09/05/2016, 9:36 AM Pager: [phone]  If 7PM-7AM, please contact night-coverage [email or web address] Password [facility] 09/05/2016, 9:36 AM

## 2016-09-05 NOTE — Progress Notes (Signed)
Patient ID: Ronald Simpson, male   DOB: [birth year], 71 y.o.   MRN: [id number]   ADVANCED HF CLINIC NOTE  SUBJECTIVE:   Milrinone decreased yesterday. Creatinine down from 1.6 -> 1.3 -> 1.25 ->1.18->1.22   Denies SOB, chest pain. He is up and eating. Weight down 12 pounds total.  Blood cultures NGTD. Urine culture with yeast, completed fluconazole.   CT chest 08/27/16 with large right effusion and LUL PNA. 5/18 he had right thoracentesis, exudative.    Scheduled Meds: . aspirin  81 mg Per Tube Daily  . atorvastatin  20 mg Per Tube q1800  . carvedilol  3.125 mg Oral BID WC  . chlorhexidine gluconate (MEDLINE KIT)  15 mL Mouth Rinse BID  . collagenase   Topical Daily  . digoxin  0.125 mg Oral Daily  . heparin  5,000 Units Subcutaneous Q8H  . insulin aspart  0-5 Units Subcutaneous QHS  . insulin aspart  0-9 Units Subcutaneous TID WC  . ipratropium-albuterol  3 mL Nebulization TID  . levothyroxine  25 mcg Oral QAC breakfast  . losartan  50 mg Oral BID  . mupirocin ointment   Nasal BID  . pantoprazole  40 mg Oral QHS  . polyethylene glycol  17 g Oral Daily  . senna  1 tablet Oral Daily  . sertraline  25 mg Oral Daily  . spironolactone  25 mg Oral Daily  . torsemide  60 mg Oral Daily  . valACYclovir  1,000 mg Oral BID   Continuous Infusions: . sodium chloride Stopped (09/04/16 2000)  . milrinone 0.125 mcg/kg/min (09/04/16 0800)   PRN Meds:.sodium chloride, diclofenac sodium, HYDROcodone-acetaminophen   Vitals:   09/05/16 0400 09/05/16 0452 09/05/16 0500 09/05/16 0600  BP: (!) 146/95  (!) 179/104 (!) 172/117  Pulse: 92  100 (!) 104  Resp: _0 Temp:   98 F (36.7 C)   TempSrc:   Oral   SpO2: 99%  98% 99%  Weight:  159 lb 9.8 oz (72.4 kg)    Height:        Intake/Output Summary (Last 24 hours) at 09/05/16 0809 Last data filed at 09/05/16 0500  Gross per 24 hour  Intake              342 ml  Output              875 ml  Net             -533 ml    LABS: Basic  Metabolic Panel:  Recent Labs  09/04/16 0237 09/05/16 0500  NA 132* 132*  K 4.3 4.0  CL 96* 92*  CO2 29 31  GLUCOSE 144* 201*  BUN 34* 34*  CREATININE 1.18 1.22  CALCIUM 8.8* 9.1    CBC:  Recent Labs  09/04/16 0237 09/05/16 0500  WBC 6.7 8.1  HGB 9.9* 10.8*  HCT 32.4* 34.6*  MCV 85.0 84.6  PLT 256 274    RADIOLOGY: Ct Chest Wo Contrast  Result Date: 08/27/2016 CLINICAL DATA:  Lung mass. EXAM: CT CHEST WITHOUT CONTRAST TECHNIQUE: Multidetector CT imaging of the chest was performed following the standard protocol without IV contrast. COMPARISON:  08/02/2016 FINDINGS: Cardiovascular: Cardiomegaly without pericardial effusion. Diffuse atherosclerotic calcification, status post CABG. Mitral valve annuloplasty. Mediastinum/Nodes: Atherosclerotic calcification. No acute finding. Right IJ central line with tip at the upper cavoatrial junction. An orogastric tube reaches the stomach. Lungs/Pleura: Large layering right pleural effusion with multi segment atelectasis, essentially stable from 08/02/2016. The  lower lobe is completely collapsed. The aerated lung shows no pulmonary edema. Smaller layering left pleural effusion, also with atelectasis. The left lung is mildly scalloped, also seen previously, suggesting some pleural fluid complexity. Posterior segment left upper lobe consolidative and ground-glass densities that are new from prior and suspicious for infection. Upper Abdomen: No acute finding.  History of cirrhosis. Musculoskeletal: No acute or aggressive finding. IMPRESSION: 1. Large layering right pleural effusion with lower lobe collapse, essentially stable compared to 08/02/2016 chest CT. 2. Small layering left pleural effusion with atelectasis. Left upper lobe pneumonia or pneumonitis that is new from 08/02/2016. 3. Tubes and central line in good position. Electronically Signed   By: Monte Fantasia M.D.   On: 08/27/2016 14:31   Dg Chest Port 1 View  Result Date:  09/02/2016 CLINICAL DATA:  CHF EXAM: PORTABLE CHEST 1 VIEW COMPARISON:  08/31/2016 FINDINGS: Cardiac shadow is stable. Postsurgical changes are again seen. Right jugular central line is again noted and stable. Some improved aeration is noted in the bases bilaterally although persistent bilateral pleural effusions are seen. No pneumothorax is noted. No bony abnormality is seen. IMPRESSION: Improving aeration in the bases although persistent effusions are again seen. Electronically Signed   By: Inez Catalina M.D.   On: 09/02/2016 07:36   Dg Chest Port 1 View  Result Date: 08/31/2016 CLINICAL DATA:  Rib fracture.  Coronary artery disease.  Dyspnea. EXAM: PORTABLE CHEST 1 VIEW COMPARISON:  One-view chest x-ray 08/30/2016 FINDINGS: A right IJ line is stable. Bilateral pleural effusions are increasing, right greater than left. Associated airspace disease is present. Moderate pulmonary vascular congestion is worse right than left. IMPRESSION: 1. Cardiomegaly with increasing the moderate pulmonary vascular congestion and right greater than left pleural effusions compatible with congestive heart failure. 2. Asymmetric right-sided airspace disease likely reflects atelectasis. Pneumonia is not excluded. Electronically Signed   By: San Morelle M.D.   On: 08/31/2016 08:37   Dg Chest Port 1 View  Result Date: 08/30/2016 CLINICAL DATA:  Pleural effusion EXAM: PORTABLE CHEST 1 VIEW COMPARISON:  08/29/2016 FINDINGS: Right central line remains in place, unchanged. Prior median sternotomy and valve replacement. Cardiomegaly. Worsening bilateral perihilar and lower lobe airspace opacities. Small bilateral effusions. IMPRESSION: Worsening bilateral perihilar and lower lobe opacities which may reflect edema, atelectasis, or a combination. Small bilateral pleural effusions. Electronically Signed   By: Rolm Baptise M.D.   On: 08/30/2016 10:33   Dg Chest Port 1 View  Result Date: 08/29/2016 CLINICAL DATA:  Status post  right thoracentesis EXAM: PORTABLE CHEST 1 VIEW COMPARISON:  08/29/2016 FINDINGS: Near complete evacuation of the right effusion following thoracentesis. No pneumothorax. Stable cardiomegaly and mild edema pattern. Bibasilar atelectasis persist, worse on the left. Previous median sternotomy and mitral valve replacement. Right IJ central line tip SVC RA junction. Trachea is midline. Aorta is atherosclerotic. IMPRESSION: Negative for pneumothorax following right thoracentesis. Mild CHF pattern Electronically Signed   By: Jerilynn Mages.  Shick M.D.   On: 08/29/2016 12:22   Dg Chest Port 1 View  Result Date: 08/29/2016 CLINICAL DATA:  Shortness of breath.  Acute respiratory failure. EXAM: PORTABLE CHEST 1 VIEW COMPARISON:  Chest x-ray 08/28/2016 08/27/2016. CT chest 08/27/2016 . FINDINGS: Interim extubation removal of NG tube . Right IJ line in stable position. Prior CABG and cardiac valve replacement. Stable cardiomegaly. Persistent bilateral atelectatic changes. Persistent bilateral pleural effusions, right side greater than left. Chest is unchanged from prior exam. No pneumothorax . IMPRESSION: 1. Interim extubation and removal of NG  tube. Right IJ line stable position. 2. Persistent bilateral atelectatic changes with bilateral pleural effusions, right side worse than left. No significant change from prior exam. 3. Prior CABG. Prior cardiac valve replacement. Stable cardiomegaly . Electronically Signed   By: Marcello Moores  Register   On: 08/29/2016 07:56   Dg Chest Port 1 View  Result Date: 08/28/2016 CLINICAL DATA:  Intubated patient, respiratory failure EXAM: PORTABLE CHEST 1 VIEW COMPARISON:  CT scan of the chest and portable chest x-ray of Aug 27, 2016 FINDINGS: The left lung is well-expanded. On the right there is persistent increased density in the mid and lower lung. The right hemidiaphragm is obscured. There is a small left pleural effusion. There is no pneumothorax. The heart remains mildly enlarged. A prosthetic  mitral valve ring is visible. The pulmonary vascularity is normal. The endotracheal tube tip lies 5.2 cm above the carina. The esophagogastric tube tip in proximal port project below the GE junction. The right internal jugular venous catheter tip projects over the midportion of the SVC. IMPRESSION: Large right and smaller left pleural effusion, stable. Mild cardiomegaly without pulmonary vascular congestion. The support tubes are in reasonable position. Thoracic aortic atherosclerosis. Electronically Signed   By: David  Martinique M.D.   On: 08/28/2016 07:41   Dg Chest Port 1 View  Result Date: 08/27/2016 CLINICAL DATA:  Endotracheal tube EXAM: PORTABLE CHEST 1 VIEW COMPARISON:  Earlier today FINDINGS: Endotracheal tube tip 16 mm above the carina. An orogastric tube is coiled in the stomach. Right IJ catheter with tip at the upper cavoatrial junction. Inferior flow of at least moderate pleural effusion on the right. Stable postoperative heart size. Much of the right lung is obscured. No suspected pulmonary edema. No visible pneumothorax. IMPRESSION: 1. Advanced endotracheal tube, tip 16 mm above the carina. 2. Pleural effusion on the right with inferior flow compared to earlier today. Electronically Signed   By: Monte Fantasia M.D.   On: 08/27/2016 10:31   Dg Chest Portable 1 View  Result Date: 08/27/2016 CLINICAL DATA:  Central line placement EXAM: PORTABLE CHEST 1 VIEW COMPARISON:  08/27/2016 at 0059 hours FINDINGS: New right-sided IJ central line catheter is seen at the cavoatrial junction. No pneumothorax is noted. Heart is borderline enlarged. Patient is status post CABG and mitral valvular replacement. Endotracheal tube is 6 cm above the carina. Gastric tube extends below the left hemidiaphragm. Loculated pleural effusion is again seen along the periphery the right lung. Small left effusion is also present unchanged in appearance. IMPRESSION: 1. No pneumothorax after right IJ central line placement. The  tip is seen at the cavoatrial junction. 2. Satisfactory support line and tube positions. 3. Bilateral pleural effusions right greater than left with loculation of fluid along the periphery of the right lung as before. Electronically Signed   By: Ashley Royalty M.D.   On: 08/27/2016 02:00   Dg Chest Portable 1 View  Result Date: 08/27/2016 CLINICAL DATA:  Endotracheal and gastric tube placement EXAM: PORTABLE CHEST 1 VIEW COMPARISON:  None. FINDINGS: The tip of a gastric tube is seen coiled in the left upper quadrant of the abdomen in the expected location the stomach. An endotracheal tube is noted with tip approximately 5.1 cm above the carina. The patient is status post median sternotomy and CABG. Heart is top-normal. There is mild aortic atherosclerosis. Bilateral pleural effusions moderate to large on the right and small on the left are again seen without change. No acute nor suspicious osseous abnormality. IMPRESSION: Satisfactory support line  and tube positions. Status post CABG. Right greater than left pleural effusions remain unchanged. Electronically Signed   By: Ashley Royalty M.D.   On: 08/27/2016 01:12   Dg Chest Portable 1 View  Result Date: 08/27/2016 CLINICAL DATA:  Suspected sepsis. Shortness of breath, weakness and cough today. EXAM: PORTABLE CHEST 1 VIEW COMPARISON:  Radiograph 08/04/2016 FINDINGS: Post median sternotomy with prosthetic presumed mitral valve. Cardiomediastinal contours are grossly stable, partially obscured by right-sided pulmonary opacity. Increased right pleural effusion with hazy opacity in the right mid lower lung zone and pleural fluid tracking about the lateral chest. Suspect small left pleural effusion. There is vascular congestion and probable perihilar edema. No pneumothorax. IMPRESSION: Reaccumulation right pleural effusion, moderate to large in size. Small left pleural effusion. Vascular congestion and probable pulmonary edema. Electronically Signed   By: Jeb Levering M.D.   On: 08/27/2016 00:21    PHYSICAL EXAM General: Male, NAD. Lying in bed.  HEENT: Normal Neck: Supple. 8 cm JVD. Carotids 2+ bilat; no bruits. No thyromegaly or nodule noted. Cor: PMI nondisplaced. Regular rate and rhythm, No M/G/R noted Lungs: Rhonchi in bilateral upper lobes, diminished in bases.  Abdomen: Soft, nontender, distended. no HSM. No bruits or masses. + bowel sounds.  Extremities: no cyanosis, clubbing, rash, No lower extremity edema.  S/p right AKA.  Neuro: alert & orientedx3, cranial nerves grossly intact. moves all 4 extremities w/o difficulty. Affect pleasant   TELEMETRY: NSR.   ASSESSMENT AND PLAN: 1. Acute on chronic systolic CHF: EF 91-50% with moderate RV dysfunction in 4/18, ischemic cardiomyopathy.  Milrinone started, primarily for RV support.  - Stop milrinone today.  - Continue torsemide 60 mg daily.  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  - Start Entresto 49/91m BID.  - Increase Coreg to 6.236mBID.  - Continue spironolactone 25 mg daily.   - Continue digoxin 0.125 daily.  2. Shock: Resolved.  Suspected primarily septic shock with left-sided PNA and UTI.  3. CAD: h/o CABG.  Mild rise in troponin with no trend is likely demand ischemia.   - Continue ASA and atorvastatin. - no change to current plan.  4. 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  Fluid was exudative but cultures negative so far. - No change to current plan.  5. Atrial fibrillation: Paroxysmal - Remains in NSR - Not anticoagulated due to history of GI bleeds/cirrhosis. - Continue current medical management.   6. AKI on CKD stage III:  - Improving 7. 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 No change. 8. Altered mental status: Presented with AMS, intubated.  Likely due to combination of hypoglycemia, infection, shock.   - Improved, no change to current plan.  9. PAD: s/p Right AKA. Last year.  - Stable, no  change.  10. ID: Left-sided PNA, UTI on UA.   - Currently on doxycycline, Augmentin stopped.  Fluconazole completed for yeast in urine. 11. Deconditioning: Bed-bound, does not have prosthesis with him, lives in SNF.  - PT has seen.  12. DM2 - Per primary  ErArbutus Leas/25/2018 8:09 AM   Advanced Heart Failure Team  Pager [phone] 7am-4pm.  Please contact [facility] for night-coverage after hours (4p -7a ) and weekends on [email or web address]  Patient seen with NP, agree with the above note.  Stable today.  CVP around 9 when checked personally.  Weight continues to come down.  BP is high.  - Stop milrinone today, check co-ox after that.  -  Stop losartan, start Entresto 49/51 bid.  Agree with increase in Coreg to 6.25 mg bid as well. Continue spironolactone and digoxin.   If he remains stable off milrinone, possibly he will be ready for discharge to SNF tomorrow.   Loralie Champagne 09/05/2016 9:14 AM

## 2016-09-06 LAB — CBC
HEMATOCRIT: 38.6 % — AB (ref 39.0–52.0)
HEMOGLOBIN: 12.3 g/dL — AB (ref 13.0–17.0)
MCH: 27.2 pg (ref 26.0–34.0)
MCHC: 31.9 g/dL (ref 30.0–36.0)
MCV: 85.2 fL (ref 78.0–100.0)
Platelets: 269 10*3/uL (ref 150–400)
RBC: 4.53 MIL/uL (ref 4.22–5.81)
RDW: 18.1 % — ABNORMAL HIGH (ref 11.5–15.5)
WBC: 9.4 10*3/uL (ref 4.0–10.5)

## 2016-09-06 LAB — COOXEMETRY PANEL
CARBOXYHEMOGLOBIN: 1.1 % (ref 0.5–1.5)
Methemoglobin: 1 % (ref 0.0–1.5)
O2 Saturation: 50.8 %
TOTAL HEMOGLOBIN: 12.8 g/dL (ref 12.0–16.0)

## 2016-09-06 LAB — GLUCOSE, CAPILLARY
GLUCOSE-CAPILLARY: 169 mg/dL — AB (ref 65–99)
Glucose-Capillary: 176 mg/dL — ABNORMAL HIGH (ref 65–99)

## 2016-09-06 LAB — BASIC METABOLIC PANEL
ANION GAP: 9 (ref 5–15)
BUN: 32 mg/dL — ABNORMAL HIGH (ref 6–20)
CALCIUM: 9 mg/dL (ref 8.9–10.3)
CO2: 33 mmol/L — ABNORMAL HIGH (ref 22–32)
Chloride: 91 mmol/L — ABNORMAL LOW (ref 101–111)
Creatinine, Ser: 1.21 mg/dL (ref 0.61–1.24)
GFR calc non Af Amer: 60 mL/min — ABNORMAL LOW (ref >60)
GLUCOSE: 170 mg/dL — AB (ref 65–99)
POTASSIUM: 3.8 mmol/L (ref 3.5–5.1)
SODIUM: 133 mmol/L — AB (ref 135–145)

## 2016-09-06 MED ORDER — COLCHICINE 0.6 MG PO TABS: 0.6000 mg | ORAL_TABLET | Freq: Every day | ORAL | 0 refills | Status: DC

## 2016-09-06 MED ORDER — FERROUS SULFATE 325 (65 FE) MG PO TABS: 325.0000 mg | ORAL_TABLET | Freq: Every day | ORAL | 3 refills | Status: DC

## 2016-09-06 MED ORDER — SPIRONOLACTONE 25 MG PO TABS: 25.0000 mg | ORAL_TABLET | Freq: Every day | ORAL | Status: DC

## 2016-09-06 MED ORDER — TORSEMIDE 20 MG PO TABS: 60.0000 mg | ORAL_TABLET | Freq: Every day | ORAL | Status: DC

## 2016-09-06 MED ORDER — SACUBITRIL-VALSARTAN 49-51 MG PO TABS: 1.0000 | ORAL_TABLET | Freq: Two times a day (BID) | ORAL | Status: DC

## 2016-09-06 MED ORDER — VALACYCLOVIR HCL 1 G PO TABS: 1000.0000 mg | ORAL_TABLET | Freq: Two times a day (BID) | ORAL | 0 refills | Status: AC

## 2016-09-06 MED ORDER — INSULIN GLARGINE 100 UNITS/ML SOLOSTAR PEN: 8.0000 [IU] | PEN_INJECTOR | Freq: Every day | SUBCUTANEOUS | 11 refills | Status: DC

## 2016-09-06 MED ORDER — SENNA 8.6 MG PO TABS
1.0000 | ORAL_TABLET | Freq: Every day | ORAL | 0 refills | Status: DC | PRN
Start: 2016-09-06 — End: 2016-11-23

## 2016-09-06 MED ORDER — DIGOXIN 125 MCG PO TABS: 0.1250 mg | ORAL_TABLET | Freq: Every day | ORAL | Status: AC

## 2016-09-06 MED ORDER — SODIUM CHLORIDE 0.9% FLUSH
10.0000 mL | INTRAVENOUS | Status: DC | PRN
Start: 2016-09-06 — End: 2016-09-06

## 2016-09-06 NOTE — Progress Notes (Signed)
Right IJ removed without incident and sterile pressure dressing applied.  Pt understands dressing should remain for 24 hrs.  Planning for DC back to SNF today.

## 2016-09-06 NOTE — Progress Notes (Signed)
Patient ID: Ronald Simpson, male   DOB: 09/24/1945, 71 y.o.   MRN: [id number]   ADVANCED HF CLINIC NOTE  SUBJECTIVE:   Milrinone stopped on 5/25. Creatinine down from 1.6 -> 1.3 -> 1.25 ->1.18->1.22-> 1.21.  Co-ox today 51%.   Denies SOB, chest pain. He is up and eating. Weight down 13 pounds total.  Blood cultures NGTD. Urine culture with yeast, completed fluconazole.   CT chest 08/27/16 with large right effusion and LUL PNA. 5/18 he had right thoracentesis, exudative.    Scheduled Meds: . aspirin  81 mg Per Tube Daily  . atorvastatin  20 mg Per Tube q1800  . carvedilol  6.25 mg Oral BID WC  . chlorhexidine gluconate (MEDLINE KIT)  15 mL Mouth Rinse BID  . collagenase   Topical Daily  . digoxin  0.125 mg Oral Daily  . heparin  5,000 Units Subcutaneous Q8H  . insulin aspart  0-5 Units Subcutaneous QHS  . insulin aspart  0-9 Units Subcutaneous TID WC  . ipratropium-albuterol  3 mL Nebulization TID  . levothyroxine  25 mcg Oral QAC breakfast  . mupirocin ointment   Nasal BID  . pantoprazole  40 mg Oral QHS  . polyethylene glycol  17 g Oral Daily  . sacubitril-valsartan  1 tablet Oral BID  . senna  1 tablet Oral Daily  . sertraline  25 mg Oral Daily  . spironolactone  25 mg Oral Daily  . torsemide  60 mg Oral Daily  . valACYclovir  1,000 mg Oral BID   Continuous Infusions: . sodium chloride Stopped (09/04/16 2000)   PRN Meds:.sodium chloride, diclofenac sodium, HYDROcodone-acetaminophen, sodium chloride flush   Vitals:   09/05/16 1502 09/05/16 1953 09/06/16 0500 09/06/16 0850  BP:   122/82   Pulse:   87   Resp:   18   Temp:   98.7 F (37.1 C)   TempSrc:   Oral   SpO2: 100% 99% 98% 92%  Weight:   158 lb (71.7 kg)   Height:        Intake/Output Summary (Last 24 hours) at 09/06/16 1119 Last data filed at 09/05/16 1124  Gross per 24 hour  Intake              120 ml  Output              100 ml  Net               20 ml    LABS: Basic Metabolic Panel:  Recent  Labs  09/05/16 0500 09/06/16 0500  NA 132* 133*  K 4.0 3.8  CL 92* 91*  CO2 31 33*  GLUCOSE 201* 170*  BUN 34* 32*  CREATININE 1.22 1.21  CALCIUM 9.1 9.0    CBC:  Recent Labs  09/05/16 0500 09/06/16 0500  WBC 8.1 9.4  HGB 10.8* 12.3*  HCT 34.6* 38.6*  MCV 84.6 85.2  PLT 274 269    RADIOLOGY: Ct Chest Wo Contrast  Result Date: 08/27/2016 CLINICAL DATA:  Lung mass. EXAM: CT CHEST WITHOUT CONTRAST TECHNIQUE: Multidetector CT imaging of the chest was performed following the standard protocol without IV contrast. COMPARISON:  08/02/2016 FINDINGS: Cardiovascular: Cardiomegaly without pericardial effusion. Diffuse atherosclerotic calcification, status post CABG. Mitral valve annuloplasty. Mediastinum/Nodes: Atherosclerotic calcification. No acute finding. Right IJ central line with tip at the upper cavoatrial junction. An orogastric tube reaches the stomach. Lungs/Pleura: Large layering right pleural effusion with multi segment atelectasis, essentially stable from 08/02/2016. The lower lobe  is completely collapsed. The aerated lung shows no pulmonary edema. Smaller layering left pleural effusion, also with atelectasis. The left lung is mildly scalloped, also seen previously, suggesting some pleural fluid complexity. Posterior segment left upper lobe consolidative and ground-glass densities that are new from prior and suspicious for infection. Upper Abdomen: No acute finding.  History of cirrhosis. Musculoskeletal: No acute or aggressive finding. IMPRESSION: 1. Large layering right pleural effusion with lower lobe collapse, essentially stable compared to 08/02/2016 chest CT. 2. Small layering left pleural effusion with atelectasis. Left upper lobe pneumonia or pneumonitis that is new from 08/02/2016. 3. Tubes and central line in good position. Electronically Signed   By: Monte Fantasia M.D.   On: 08/27/2016 14:31   Dg Chest Port 1 View  Result Date: 09/02/2016 CLINICAL DATA:  CHF EXAM:  PORTABLE CHEST 1 VIEW COMPARISON:  08/31/2016 FINDINGS: Cardiac shadow is stable. Postsurgical changes are again seen. Right jugular central line is again noted and stable. Some improved aeration is noted in the bases bilaterally although persistent bilateral pleural effusions are seen. No pneumothorax is noted. No bony abnormality is seen. IMPRESSION: Improving aeration in the bases although persistent effusions are again seen. Electronically Signed   By: Inez Catalina M.D.   On: 09/02/2016 07:36   Dg Chest Port 1 View  Result Date: 08/31/2016 CLINICAL DATA:  Rib fracture.  Coronary artery disease.  Dyspnea. EXAM: PORTABLE CHEST 1 VIEW COMPARISON:  One-view chest x-ray 08/30/2016 FINDINGS: A right IJ line is stable. Bilateral pleural effusions are increasing, right greater than left. Associated airspace disease is present. Moderate pulmonary vascular congestion is worse right than left. IMPRESSION: 1. Cardiomegaly with increasing the moderate pulmonary vascular congestion and right greater than left pleural effusions compatible with congestive heart failure. 2. Asymmetric right-sided airspace disease likely reflects atelectasis. Pneumonia is not excluded. Electronically Signed   By: San Morelle M.D.   On: 08/31/2016 08:37   Dg Chest Port 1 View  Result Date: 08/30/2016 CLINICAL DATA:  Pleural effusion EXAM: PORTABLE CHEST 1 VIEW COMPARISON:  08/29/2016 FINDINGS: Right central line remains in place, unchanged. Prior median sternotomy and valve replacement. Cardiomegaly. Worsening bilateral perihilar and lower lobe airspace opacities. Small bilateral effusions. IMPRESSION: Worsening bilateral perihilar and lower lobe opacities which may reflect edema, atelectasis, or a combination. Small bilateral pleural effusions. Electronically Signed   By: Rolm Baptise M.D.   On: 08/30/2016 10:33   Dg Chest Port 1 View  Result Date: 08/29/2016 CLINICAL DATA:  Status post right thoracentesis EXAM: PORTABLE  CHEST 1 VIEW COMPARISON:  08/29/2016 FINDINGS: Near complete evacuation of the right effusion following thoracentesis. No pneumothorax. Stable cardiomegaly and mild edema pattern. Bibasilar atelectasis persist, worse on the left. Previous median sternotomy and mitral valve replacement. Right IJ central line tip SVC RA junction. Trachea is midline. Aorta is atherosclerotic. IMPRESSION: Negative for pneumothorax following right thoracentesis. Mild CHF pattern Electronically Signed   By: Jerilynn Mages.  Shick M.D.   On: 08/29/2016 12:22   Dg Chest Port 1 View  Result Date: 08/29/2016 CLINICAL DATA:  Shortness of breath.  Acute respiratory failure. EXAM: PORTABLE CHEST 1 VIEW COMPARISON:  Chest x-ray 08/28/2016 08/27/2016. CT chest 08/27/2016 . FINDINGS: Interim extubation removal of NG tube . Right IJ line in stable position. Prior CABG and cardiac valve replacement. Stable cardiomegaly. Persistent bilateral atelectatic changes. Persistent bilateral pleural effusions, right side greater than left. Chest is unchanged from prior exam. No pneumothorax . IMPRESSION: 1. Interim extubation and removal of NG tube. Right  IJ line stable position. 2. Persistent bilateral atelectatic changes with bilateral pleural effusions, right side worse than left. No significant change from prior exam. 3. Prior CABG. Prior cardiac valve replacement. Stable cardiomegaly . Electronically Signed   By: Marcello Moores  Register   On: 08/29/2016 07:56   Dg Chest Port 1 View  Result Date: 08/28/2016 CLINICAL DATA:  Intubated patient, respiratory failure EXAM: PORTABLE CHEST 1 VIEW COMPARISON:  CT scan of the chest and portable chest x-ray of Aug 27, 2016 FINDINGS: The left lung is well-expanded. On the right there is persistent increased density in the mid and lower lung. The right hemidiaphragm is obscured. There is a small left pleural effusion. There is no pneumothorax. The heart remains mildly enlarged. A prosthetic mitral valve ring is visible. The  pulmonary vascularity is normal. The endotracheal tube tip lies 5.2 cm above the carina. The esophagogastric tube tip in proximal port project below the GE junction. The right internal jugular venous catheter tip projects over the midportion of the SVC. IMPRESSION: Large right and smaller left pleural effusion, stable. Mild cardiomegaly without pulmonary vascular congestion. The support tubes are in reasonable position. Thoracic aortic atherosclerosis. Electronically Signed   By: David  Martinique M.D.   On: 08/28/2016 07:41   Dg Chest Port 1 View  Result Date: 08/27/2016 CLINICAL DATA:  Endotracheal tube EXAM: PORTABLE CHEST 1 VIEW COMPARISON:  Earlier today FINDINGS: Endotracheal tube tip 16 mm above the carina. An orogastric tube is coiled in the stomach. Right IJ catheter with tip at the upper cavoatrial junction. Inferior flow of at least moderate pleural effusion on the right. Stable postoperative heart size. Much of the right lung is obscured. No suspected pulmonary edema. No visible pneumothorax. IMPRESSION: 1. Advanced endotracheal tube, tip 16 mm above the carina. 2. Pleural effusion on the right with inferior flow compared to earlier today. Electronically Signed   By: Monte Fantasia M.D.   On: 08/27/2016 10:31   Dg Chest Portable 1 View  Result Date: 08/27/2016 CLINICAL DATA:  Central line placement EXAM: PORTABLE CHEST 1 VIEW COMPARISON:  08/27/2016 at 0059 hours FINDINGS: New right-sided IJ central line catheter is seen at the cavoatrial junction. No pneumothorax is noted. Heart is borderline enlarged. Patient is status post CABG and mitral valvular replacement. Endotracheal tube is 6 cm above the carina. Gastric tube extends below the left hemidiaphragm. Loculated pleural effusion is again seen along the periphery the right lung. Small left effusion is also present unchanged in appearance. IMPRESSION: 1. No pneumothorax after right IJ central line placement. The tip is seen at the cavoatrial  junction. 2. Satisfactory support line and tube positions. 3. Bilateral pleural effusions right greater than left with loculation of fluid along the periphery of the right lung as before. Electronically Signed   By: Ashley Royalty M.D.   On: 08/27/2016 02:00   Dg Chest Portable 1 View  Result Date: 08/27/2016 CLINICAL DATA:  Endotracheal and gastric tube placement EXAM: PORTABLE CHEST 1 VIEW COMPARISON:  None. FINDINGS: The tip of a gastric tube is seen coiled in the left upper quadrant of the abdomen in the expected location the stomach. An endotracheal tube is noted with tip approximately 5.1 cm above the carina. The patient is status post median sternotomy and CABG. Heart is top-normal. There is mild aortic atherosclerosis. Bilateral pleural effusions moderate to large on the right and small on the left are again seen without change. No acute nor suspicious osseous abnormality. IMPRESSION: Satisfactory support line and tube  positions. Status post CABG. Right greater than left pleural effusions remain unchanged. Electronically Signed   By: Ashley Royalty M.D.   On: 08/27/2016 01:12   Dg Chest Portable 1 View  Result Date: 08/27/2016 CLINICAL DATA:  Suspected sepsis. Shortness of breath, weakness and cough today. EXAM: PORTABLE CHEST 1 VIEW COMPARISON:  Radiograph 08/04/2016 FINDINGS: Post median sternotomy with prosthetic presumed mitral valve. Cardiomediastinal contours are grossly stable, partially obscured by right-sided pulmonary opacity. Increased right pleural effusion with hazy opacity in the right mid lower lung zone and pleural fluid tracking about the lateral chest. Suspect small left pleural effusion. There is vascular congestion and probable perihilar edema. No pneumothorax. IMPRESSION: Reaccumulation right pleural effusion, moderate to large in size. Small left pleural effusion. Vascular congestion and probable pulmonary edema. Electronically Signed   By: Jeb Levering M.D.   On: 08/27/2016 00:21     PHYSICAL EXAM General: Male, NAD. Lying in bed.  HEENT: Normal Neck: Supple. JVP 7 cm. Carotids 2+ bilat; no bruits. No thyromegaly or nodule noted. Cor: PMI nondisplaced. Regular rate and rhythm, No M/G/R noted Lungs: Clear bilaterally.  Abdomen: Soft, nontender, distended. no HSM. No bruits or masses. + bowel sounds.  Extremities: no cyanosis, clubbing, rash, No lower extremity edema.  S/p right AKA.  Neuro: alert & orientedx3, cranial nerves grossly intact. moves all 4 extremities w/o difficulty. Affect pleasant   TELEMETRY: Personally reviewed, NSR.   ASSESSMENT AND PLAN: 1. Acute on chronic systolic CHF: EF 90-24% with moderate RV dysfunction in 4/18, ischemic cardiomyopathy.  Milrinone started, primarily for RV support.  Now off milrinone, co-ox 51%. Volume status looks good at this point.  He feels good, no dyspnea.  - Co-ox not ideal, but patient is not a candidate for LVAD and is really not a great home milrinone candidate.  As he feels good and is on a good medical regimen, will try to manage him without milrinone for now.  - Continue torsemide 60 mg daily.  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  - Continue Entresto 49/65m BID.  - Continue Coreg 6.246mBID.  - Continue spironolactone 25 mg daily.   - Continue digoxin 0.125 daily, will need to check level at followup.  2. Shock: Resolved.  Suspected primarily septic shock with left-sided PNA and UTI.  3. CAD: h/o CABG.  Mild rise in troponin with no trend is likely demand ischemia.   - Continue ASA and atorvastatin. 4. 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  Fluid was exudative but cultures negative so far. - No change to current plan.  5. Atrial fibrillation: Paroxysmal - Remains in NSR - Not anticoagulated due to history of GI bleeds/cirrhosis. - Continue current medical management.   6. AKI on CKD stage III:  - Improved 7. 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 No change. 8. Altered mental status: Presented with AMS, intubated.  Likely due to combination of hypoglycemia, infection, shock.   - Improved, no change to current plan.  9. PAD: s/p Right AKA. Last year.  - Stable, no change.  10. ID: Left-sided PNA, UTI on UA.   - Currently on doxycycline, Augmentin stopped.  Fluconazole completed for yeast in urine. 11. Deconditioning: Bed-bound, does not have prosthesis with him, lives in SNF.  - PT has seen.  12. DM2 - Per primary 13. Disposition: Ready to return to SNF today.  Followup in CHF clinic in around 10 days.  Meds for discharge: Entresto 49/52 bid,  Coreg 6.25 mg bid, spironolactone 25 mg daily, digoxin 0.125 daily, torsemide 60 daily, KCl 20 daily, ASA 81, atorvastatin 20 daily.   Loralie Champagne 09/06/2016 11:19 AM   Advanced Heart Failure Team  Pager [phone] M-F 7am-4pm.  Please contact [facility] for night-coverage after hours (4p -7a ) and weekends on [email or web address]

## 2016-09-06 NOTE — Discharge Summary (Signed)
Physician Discharge Summary  Ronald Simpson:[id number] DOB: 25-May-1945 DOA: 08/26/2016  PCP: Pecola Lawless, MD Cardiologist: Shirlee Latch heart failure team  Admit date: 08/26/2016 Discharge date: 09/06/2016  Admitted From: SNF Disposition: SNF  Recommendations for Outpatient Follow-up:  1. Follow up with PCP in 1 weeks 2. Follow up with cardiology on 6/6 as scheduled. 3. Please check digoxin level on follow up  4. Please obtain daily weights 5. Discontinue Valtrex after 3 more days of treatment 6. Continue local wound care to foot wound and consult wound care if needed 7. Monitor blood glucose TIDAC and PRN   Discharge Condition: STABLE  CODE STATUS: FULL  Diet recommendation: Heart Healthy   Brief/Interim Summary: HISTORY OF PRESENT ILLNESS:  Pt is encephelopathic; therefore, this HPI is obtained from chart review. Ronald Simpson is a 71 y.o. male with PMH as outlined below and who resides at [facility].  He was brought to [facility] ED 5/15 with decreased level of consciousness along with hypoglycemia (CBG 30s initially) and SOB.  Upon arrival to ED, repeat CBG mid 60s and pt was noted to be in obvious respiratory distress. He was subsequently intubated and PCCM was called for admission.  CXR c/w pulmonary edema and bilateral pleural effusions.  Of note, he had admission 08/02/16 through 08/07/16.  During that admission, he had right sided thoracentesis by IR on 4/23 which was transudative and felt to be due to CHF and cirrhosis.  Following intubation, he remained hypotensive so had CVL placed and was started on levophed.  Brief Narrative: Ronald Simpson is a 71 y.o. male with a past mental history significant of chronic anemia, chronic combined systolic and diastolic heart failure, cirrhosis, HCV, CKD stage III, CVA, diabetes mellitus, insulin-dependent diabetes mellitus, paroxysmal atrial fibrillation patient presented with altered mental status found to have cardiogenic and  septic shock secondary to pneumoniae UTI in addition to CHF exacerbation. Patient required intubation and pressors for support. He was weaned off of pressors and extubated on May 17. He has been receiving diuresis for his heart failure exacerbation with improvement in respiratory status. He has been having pleural effusions and had thoracentesis performed on 5/19. Received aggressive diuresis with the help of the heart failure team.  Assessment & Plan:   Active Problems:   Respiratory failure with hypoxia (HCC)   Acute on chronic systolic heart failure (HCC)   Acute respiratory failure (HCC)   Septic shock (HCC)   Cardiogenic shock (HCC)   Status post thoracentesis   Acute respiratory failure with hypoxia - resolved now Multifactorial from pleural effusions and pneumonia. Extubated on 5/17. S/p thoracentesis on 5/19 with 1.2L out. Initial studies suggest exudative fluid. Clinically much improved. Chest x-ray with persistent effusions -continue oxygen therapy as needed.  -fluid culture (no growth x5 days)/cytology; reactive mesothelial cells, no malignant cells identified.   -treated pneumonia, completed doxycycline -PT recommendations: SNF  Acute on chronic systolic heart failure Ischemic cardiomyopathy EF of 35-40% with diffuse hypokinesis. Improvement with lasix therapy. -cardiology recommendations: off milrinone, transfer to tele 5/25, started entresto 5/25, BP much better controlled.   Pleural effusions S/p thoracentesis on 5/19. Fluid study suggests exudative. -cytology:  reactive mesothelial cells, no malignant cells identified  Sepsis secondary to pneumonia/UTI Septic shock Sepsis physiology resolved. Urine significant for yeast. Will cover for aspiration/MRSA. -completed Doxycycline -completed fluconazole  Elevated troponin Likely secondary to CHF exacerbation. Flat trend.    Ulcer on tip of penis - Question if this is a herpes lesion, herpes testing pending.  Continue valtrex. Symptoms improving per patient. Please follow up final herpes culture testing on outpatient follow up.   Cirrhosis Chronic hepatitis C Stable. Compensated.  Chronic anemia Secondary to chronic disease. Stable. No evidence of bleeding.  Left foot surgical wound Currently wrapped. Afebrile.  Continue local wound care per nursing staff.  -  Hypothyroidism -stable, continue synthroid  Diabetes Mellitus type 2 with complications - well controlled on supplemental sliding scale, following CBG (last 3)   Recent Labs (last 2 labs)    Recent Labs  09/05/16 1648 09/05/16 2100 09/06/16 0614  GLUCAP 143* 158* 169*     Depression -Continue Zoloft  Acute kidney injury Baseline appears to be 1.1. Cr back to baseline.  Continues to improve with diuresis. -Monitoring closely -diuresis per cardiology  DVT prophylaxis: Heparin subcutaneous Code Status: Full code Family Communication: None at bedside Disposition Plan: Discharge to SNF possibly later today if bed available  Consultants:   Cardiology  Procedures:   Thoracentesis (5/19)  Antimicrobials:  Vancomycin  Zosyn  Augmentin (5/20>>5/23)  Doxycycline (5/20>>  Fluconazole (5/18>>5/22)  Valtrex (5/24 - 5/29)   Discharge Diagnoses:  Active Problems:   Respiratory failure with hypoxia (HCC)   Acute on chronic systolic heart failure (HCC)   Acute respiratory failure (HCC)   Septic shock (HCC)   Cardiogenic shock (HCC)   Status post thoracentesis    Discharge Instructions  Discharge Instructions    Discharge to SNF when bed available    Complete by:  As directed      Allergies as of 09/06/2016      Reactions   Hydralazine Other (See Comments)   4/21-4/26/18 pleuritic chest pain with bilateral effusions and pericardial rub. Rule out hydralazine drug-induced lupus    Percocet [oxycodone-acetaminophen] Other (See Comments)   Needing narcan on multiple occasions due to  oversedation after getting percocet    Tramadol Other (See Comments)   Excess sedation; ? Related to pre-existing hepatic dysfunction with PMH Hep C      Medication List    STOP taking these medications   cefTRIAXone 1 g injection Commonly known as:  ROCEPHIN   DULoxetine 30 MG capsule Commonly known as:  CYMBALTA   furosemide 40 MG tablet Commonly known as:  LASIX   isosorbide mononitrate 30 MG 24 hr tablet Commonly known as:  IMDUR     TAKE these medications   acetaminophen 325 MG tablet Commonly known as:  TYLENOL Take 650 mg by mouth every 6 (six) hours as needed.   aspirin 81 MG EC tablet Take 1 tablet (81 mg total) by mouth daily.   atorvastatin 20 MG tablet Commonly known as:  LIPITOR Take 20 mg by mouth daily.   barrier cream Crea Commonly known as:  non-specified Apply 1 application topically 2 (two) times daily.   carvedilol 6.25 MG tablet Commonly known as:  COREG Take 6.25 mg by mouth 2 (two) times daily with a meal.   colchicine 0.6 MG tablet Take 1 tablet (0.6 mg total) by mouth daily. What changed:  when to take this   digoxin 0.125 MG tablet Commonly known as:  LANOXIN Take 1 tablet (0.125 mg total) by mouth daily.   feeding supplement (ENSURE ENLIVE) Liqd Take 237 mLs by mouth 2 (two) times daily between meals.   feeding supplement (PRO-STAT SUGAR FREE 64) Liqd Take 30 mLs by mouth 2 (two) times daily.   ferrous sulfate 325 (65 FE) MG tablet Take 1 tablet (325 mg total) by mouth daily with  breakfast. What changed:  when to take this   insulin glargine 100 unit/mL Sopn Commonly known as:  LANTUS Inject 0.08 mLs (8 Units total) into the skin at bedtime. What changed:  how much to take   ipratropium-albuterol 0.5-2.5 (3) MG/3ML Soln Commonly known as:  DUONEB Take 3 mLs by nebulization 3 (three) times daily. May also use via nebulizer every 6 hours as needed for shortness of breath   levothyroxine 25 MCG tablet Commonly known as:   SYNTHROID, LEVOTHROID Take 25 mcg by mouth daily before breakfast.   nitroGLYCERIN 0.4 MG SL tablet Commonly known as:  NITROSTAT Place 0.4 mg under the tongue every 5 (five) minutes as needed for chest pain.   OXYGEN Inhale into the lungs. 2lpm via [place] prn for shortness of breath or O2 sat <90%   pantoprazole 40 MG tablet Commonly known as:  PROTONIX Take 1 tablet (40 mg total) by mouth daily.   sacubitril-valsartan 49-51 MG Commonly known as:  ENTRESTO Take 1 tablet by mouth 2 (two) times daily.   senna 8.6 MG Tabs tablet Commonly known as:  SENOKOT Take 1 tablet (8.6 mg total) by mouth daily as needed for mild constipation.   sertraline 25 MG tablet Commonly known as:  ZOLOFT Take 25 mg by mouth daily.   spironolactone 25 MG tablet Commonly known as:  ALDACTONE Take 1 tablet (25 mg total) by mouth daily.   torsemide 20 MG tablet Commonly known as:  DEMADEX Take 3 tablets (60 mg total) by mouth daily.   valACYclovir 1000 MG tablet Commonly known as:  VALTREX Take 1 tablet (1,000 mg total) by mouth 2 (two) times daily.      Follow-up Information    [facility] Follow up on 09/17/2016.   Specialty:  Cardiology Why:  at 230 for post hospital follow up. Please bring all of your medications to your visit. The code for parking is 6002. Enter through [employer] off of [address]. Underground parking on your right. Can also park in lower ED lot and enter thru awning.    Contact information: [address] [id number] [place] [phone] [phone]       [name], William F, MD. Schedule an appointment as soon as possible for a visit in 2 week(s).   Specialty:  Internal Medicine Why:  Hospital Follow Up  Contact information: [address] [place] [id number] [phone]          Allergies  Allergen Reactions  . Hydralazine Other (See Comments)    4/21-4/26/18 pleuritic chest pain with  bilateral effusions and pericardial rub. Rule out hydralazine drug-induced lupus   . Percocet [Oxycodone-Acetaminophen] Other (See Comments)    Needing narcan on multiple occasions due to oversedation after getting percocet   . Tramadol Other (See Comments)    Excess sedation; ? Related to pre-existing hepatic dysfunction with PMH Hep C    Procedures/Studies: Ct Chest Wo Contrast  Result Date: 08/27/2016 CLINICAL DATA:  Lung mass. EXAM: CT CHEST WITHOUT CONTRAST TECHNIQUE: Multidetector CT imaging of the chest was performed following the standard protocol without IV contrast. COMPARISON:  08/02/2016 FINDINGS: Cardiovascular: Cardiomegaly without pericardial effusion. Diffuse atherosclerotic calcification, status post CABG. Mitral valve annuloplasty. Mediastinum/Nodes: Atherosclerotic calcification. No acute finding. Right IJ central line with tip at the upper cavoatrial junction. An orogastric tube reaches the stomach. Lungs/Pleura: Large layering right pleural effusion with multi segment atelectasis, essentially stable from 08/02/2016. The lower lobe is completely collapsed. The  aerated lung shows no pulmonary edema. Smaller layering left pleural effusion, also with atelectasis. The left lung is mildly scalloped, also seen previously, suggesting some pleural fluid complexity. Posterior segment left upper lobe consolidative and ground-glass densities that are new from prior and suspicious for infection. Upper Abdomen: No acute finding.  History of cirrhosis. Musculoskeletal: No acute or aggressive finding. IMPRESSION: 1. Large layering right pleural effusion with lower lobe collapse, essentially stable compared to 08/02/2016 chest CT. 2. Small layering left pleural effusion with atelectasis. Left upper lobe pneumonia or pneumonitis that is new from 08/02/2016. 3. Tubes and central line in good position. Electronically Signed   By: Marnee Spring M.D.   On: 08/27/2016 14:31   Dg Chest Port 1  View  Result Date: 09/02/2016 CLINICAL DATA:  CHF EXAM: PORTABLE CHEST 1 VIEW COMPARISON:  08/31/2016 FINDINGS: Cardiac shadow is stable. Postsurgical changes are again seen. Right jugular central line is again noted and stable. Some improved aeration is noted in the bases bilaterally although persistent bilateral pleural effusions are seen. No pneumothorax is noted. No bony abnormality is seen. IMPRESSION: Improving aeration in the bases although persistent effusions are again seen. Electronically Signed   By: Alcide Clever M.D.   On: 09/02/2016 07:36   Dg Chest Port 1 View  Result Date: 08/31/2016 CLINICAL DATA:  Rib fracture.  Coronary artery disease.  Dyspnea. EXAM: PORTABLE CHEST 1 VIEW COMPARISON:  One-view chest x-ray 08/30/2016 FINDINGS: A right IJ line is stable. Bilateral pleural effusions are increasing, right greater than left. Associated airspace disease is present. Moderate pulmonary vascular congestion is worse right than left. IMPRESSION: 1. Cardiomegaly with increasing the moderate pulmonary vascular congestion and right greater than left pleural effusions compatible with congestive heart failure. 2. Asymmetric right-sided airspace disease likely reflects atelectasis. Pneumonia is not excluded. Electronically Signed   By: Marin Roberts M.D.   On: 08/31/2016 08:37   Dg Chest Port 1 View  Result Date: 08/30/2016 CLINICAL DATA:  Pleural effusion EXAM: PORTABLE CHEST 1 VIEW COMPARISON:  08/29/2016 FINDINGS: Right central line remains in place, unchanged. Prior median sternotomy and valve replacement. Cardiomegaly. Worsening bilateral perihilar and lower lobe airspace opacities. Small bilateral effusions. IMPRESSION: Worsening bilateral perihilar and lower lobe opacities which may reflect edema, atelectasis, or a combination. Small bilateral pleural effusions. Electronically Signed   By: Charlett Nose M.D.   On: 08/30/2016 10:33   Dg Chest Port 1 View  Result Date: 08/29/2016 CLINICAL  DATA:  Status post right thoracentesis EXAM: PORTABLE CHEST 1 VIEW COMPARISON:  08/29/2016 FINDINGS: Near complete evacuation of the right effusion following thoracentesis. No pneumothorax. Stable cardiomegaly and mild edema pattern. Bibasilar atelectasis persist, worse on the left. Previous median sternotomy and mitral valve replacement. Right IJ central line tip SVC RA junction. Trachea is midline. Aorta is atherosclerotic. IMPRESSION: Negative for pneumothorax following right thoracentesis. Mild CHF pattern Electronically Signed   By: Judie Petit.  Shick M.D.   On: 08/29/2016 12:22   Dg Chest Port 1 View  Result Date: 08/29/2016 CLINICAL DATA:  Shortness of breath.  Acute respiratory failure. EXAM: PORTABLE CHEST 1 VIEW COMPARISON:  Chest x-ray 08/28/2016 08/27/2016. CT chest 08/27/2016 . FINDINGS: Interim extubation removal of NG tube . Right IJ line in stable position. Prior CABG and cardiac valve replacement. Stable cardiomegaly. Persistent bilateral atelectatic changes. Persistent bilateral pleural effusions, right side greater than left. Chest is unchanged from prior exam. No pneumothorax . IMPRESSION: 1. Interim extubation and removal of NG tube. Right IJ line stable position.  2. Persistent bilateral atelectatic changes with bilateral pleural effusions, right side worse than left. No significant change from prior exam. 3. Prior CABG. Prior cardiac valve replacement. Stable cardiomegaly . Electronically Signed   By: Maisie Fus  Register   On: 08/29/2016 07:56   Dg Chest Port 1 View  Result Date: 08/28/2016 CLINICAL DATA:  Intubated patient, respiratory failure EXAM: PORTABLE CHEST 1 VIEW COMPARISON:  CT scan of the chest and portable chest x-ray of Aug 27, 2016 FINDINGS: The left lung is well-expanded. On the right there is persistent increased density in the mid and lower lung. The right hemidiaphragm is obscured. There is a small left pleural effusion. There is no pneumothorax. The heart remains mildly enlarged.  A prosthetic mitral valve ring is visible. The pulmonary vascularity is normal. The endotracheal tube tip lies 5.2 cm above the carina. The esophagogastric tube tip in proximal port project below the GE junction. The right internal jugular venous catheter tip projects over the midportion of the SVC. IMPRESSION: Large right and smaller left pleural effusion, stable. Mild cardiomegaly without pulmonary vascular congestion. The support tubes are in reasonable position. Thoracic aortic atherosclerosis. Electronically Signed   By: David  [place] M.D.   On: 08/28/2016 07:41   Dg Chest Port 1 View  Result Date: 08/27/2016 CLINICAL DATA:  Endotracheal tube EXAM: PORTABLE CHEST 1 VIEW COMPARISON:  Earlier today FINDINGS: Endotracheal tube tip 16 mm above the carina. An orogastric tube is coiled in the stomach. Right IJ catheter with tip at the upper cavoatrial junction. Inferior flow of at least moderate pleural effusion on the right. Stable postoperative heart size. Much of the right lung is obscured. No suspected pulmonary edema. No visible pneumothorax. IMPRESSION: 1. Advanced endotracheal tube, tip 16 mm above the carina. 2. Pleural effusion on the right with inferior flow compared to earlier today. Electronically Signed   By: Marnee Spring M.D.   On: 08/27/2016 10:31   Dg Chest Portable 1 View  Result Date: 08/27/2016 CLINICAL DATA:  Central line placement EXAM: PORTABLE CHEST 1 VIEW COMPARISON:  08/27/2016 at 0059 hours FINDINGS: New right-sided IJ central line catheter is seen at the cavoatrial junction. No pneumothorax is noted. Heart is borderline enlarged. Patient is status post CABG and mitral valvular replacement. Endotracheal tube is 6 cm above the carina. Gastric tube extends below the left hemidiaphragm. Loculated pleural effusion is again seen along the periphery the right lung. Small left effusion is also present unchanged in appearance. IMPRESSION: 1. No pneumothorax after right IJ central line  placement. The tip is seen at the cavoatrial junction. 2. Satisfactory support line and tube positions. 3. Bilateral pleural effusions right greater than left with loculation of fluid along the periphery of the right lung as before. Electronically Signed   By: Tollie Eth M.D.   On: 08/27/2016 02:00   Dg Chest Portable 1 View  Result Date: 08/27/2016 CLINICAL DATA:  Endotracheal and gastric tube placement EXAM: PORTABLE CHEST 1 VIEW COMPARISON:  None. FINDINGS: The tip of a gastric tube is seen coiled in the left upper quadrant of the abdomen in the expected location the stomach. An endotracheal tube is noted with tip approximately 5.1 cm above the carina. The patient is status post median sternotomy and CABG. Heart is top-normal. There is mild aortic atherosclerosis. Bilateral pleural effusions moderate to large on the right and small on the left are again seen without change. No acute nor suspicious osseous abnormality. IMPRESSION: Satisfactory support line and tube positions. Status post CABG.  Right greater than left pleural effusions remain unchanged. Electronically Signed   By: Tollie Eth M.D.   On: 08/27/2016 01:12   Dg Chest Portable 1 View  Result Date: 08/27/2016 CLINICAL DATA:  Suspected sepsis. Shortness of breath, weakness and cough today. EXAM: PORTABLE CHEST 1 VIEW COMPARISON:  Radiograph 08/04/2016 FINDINGS: Post median sternotomy with prosthetic presumed mitral valve. Cardiomediastinal contours are grossly stable, partially obscured by right-sided pulmonary opacity. Increased right pleural effusion with hazy opacity in the right mid lower lung zone and pleural fluid tracking about the lateral chest. Suspect small left pleural effusion. There is vascular congestion and probable perihilar edema. No pneumothorax. IMPRESSION: Reaccumulation right pleural effusion, moderate to large in size. Small left pleural effusion. Vascular congestion and probable pulmonary edema. Electronically Signed   By:  Rubye Oaks M.D.   On: 08/27/2016 00:21    (Echo, Carotid, EGD, Colonoscopy, ERCP)    Subjective: Pt feels much better.  He has been tolerating the entresto.   Discharge Exam: Vitals:   09/05/16 1426 09/06/16 0500  BP: (!) 181/93 122/82  Pulse: 94 87  Resp: 18 18  Temp: 97.7 F (36.5 C) 98.7 F (37.1 C)   Vitals:   09/05/16 1502 09/05/16 1953 09/06/16 0500 09/06/16 0850  BP:   122/82   Pulse:   87   Resp:   18   Temp:   98.7 F (37.1 C)   TempSrc:   Oral   SpO2: 100% 99% 98% 92%  Weight:   71.7 kg (158 lb)   Height:       General exam: Appears calm and comfortable Respiratory system: No crackles heard. Respiratory effort normal. Cardiovascular system: S1 & S2 heard. No murmurs. Gastrointestinal system: Abdomen is nondistended, soft and nontender. Hypoactive bowel sounds heard. Central nervous system: Alert and oriented. No focal neurological deficits. GU: ulcerated lesion on tip of penis at the urethral meatus unchanged.  Extremities: Trace edema thigh edema. No calf tenderness. Right AKA. Left foot wounds clean and dry.  Skin: No cyanosis. No rashes Psychiatry: Judgement and insight appear normal. Mood & affect appropriate. No agitation.   The results of significant diagnostics from this hospitalization (including imaging, microbiology, ancillary and laboratory) are listed below for reference.     Microbiology: Recent Results (from the past 240 hour(s))  Gram stain     Status: None   Collection Time: 08/29/16 11:40 AM  Result Value Ref Range Status   Specimen Description PLEURAL RIGHT  Final   Special Requests NONE  Final   Gram Stain   Final    WBC PRESENT,BOTH PMN AND MONONUCLEAR NO ORGANISMS SEEN    Report Status 08/29/2016 FINAL  Final  Culture, body fluid-bottle     Status: None   Collection Time: 08/29/16 11:40 AM  Result Value Ref Range Status   Specimen Description PLEURAL RIGHT  Final   Special Requests NONE  Final   Culture NO GROWTH 5 DAYS   Final   Report Status 09/03/2016 FINAL  Final     Labs: BNP (last 3 results)  Recent Labs  08/02/16 1610 08/27/16 0016  BNP 1,021.2* 1,093.5*   Basic Metabolic Panel:  Recent Labs Lab 09/02/16 0531 09/03/16 0450 09/04/16 0237 09/05/16 0500 09/06/16 0500  NA 131* 132* 132* 132* 133*  K 4.0 4.5 4.3 4.0 3.8  CL 97* 99* 96* 92* 91*  CO2 26 28 29 31  33*  GLUCOSE 160* 138* 144* 201* 170*  BUN 43* 39* 34* 34* 32*  CREATININE  1.36* 1.25* 1.18 1.22 1.21  CALCIUM 8.6* 8.6* 8.8* 9.1 9.0   Liver Function Tests: No results for input(s): AST, ALT, ALKPHOS, BILITOT, PROT, ALBUMIN in the last 168 hours. No results for input(s): LIPASE, AMYLASE in the last 168 hours. No results for input(s): AMMONIA in the last 168 hours. CBC:  Recent Labs Lab 09/02/16 0531 09/03/16 0450 09/04/16 0237 09/05/16 0500 09/06/16 0500  WBC 7.3 6.0 6.7 8.1 9.4  HGB 9.5* 9.6* 9.9* 10.8* 12.3*  HCT 30.9* 31.6* 32.4* 34.6* 38.6*  MCV 84.9 85.6 85.0 84.6 85.2  PLT 218 230 256 274 269   Cardiac Enzymes: No results for input(s): CKTOTAL, CKMB, CKMBINDEX, TROPONINI in the last 168 hours. BNP: Invalid input(s): POCBNP CBG:  Recent Labs Lab 09/03/16 2127 09/04/16 1615 09/05/16 1648 09/05/16 2100 09/06/16 0614  GLUCAP 132* 116* 143* 158* 169*   D-Dimer No results for input(s): DDIMER in the last 72 hours. Hgb A1c No results for input(s): HGBA1C in the last 72 hours. Lipid Profile No results for input(s): CHOL, HDL, LDLCALC, TRIG, CHOLHDL, LDLDIRECT in the last 72 hours. Thyroid function studies No results for input(s): TSH, T4TOTAL, T3FREE, THYROIDAB in the last 72 hours.  Invalid input(s): FREET3 Anemia work up No results for input(s): VITAMINB12, FOLATE, FERRITIN, TIBC, IRON, RETICCTPCT in the last 72 hours. Urinalysis    Component Value Date/Time   COLORURINE YELLOW 08/27/2016 0615   APPEARANCEUR HAZY (A) 08/27/2016 0615   LABSPEC 1.009 08/27/2016 0615   PHURINE 5.0 08/27/2016  0615   GLUCOSEU NEGATIVE 08/27/2016 0615   HGBUR NEGATIVE 08/27/2016 0615   BILIRUBINUR NEGATIVE 08/27/2016 0615   KETONESUR NEGATIVE 08/27/2016 0615   PROTEINUR 30 (A) 08/27/2016 0615   NITRITE NEGATIVE 08/27/2016 0615   LEUKOCYTESUR LARGE (A) 08/27/2016 0615   Sepsis Labs Invalid input(s): PROCALCITONIN,  WBC,  LACTICIDVEN Microbiology Recent Results (from the past 240 hour(s))  Gram stain     Status: None   Collection Time: 08/29/16 11:40 AM  Result Value Ref Range Status   Specimen Description PLEURAL RIGHT  Final   Special Requests NONE  Final   Gram Stain   Final    WBC PRESENT,BOTH PMN AND MONONUCLEAR NO ORGANISMS SEEN    Report Status 08/29/2016 FINAL  Final  Culture, body fluid-bottle     Status: None   Collection Time: 08/29/16 11:40 AM  Result Value Ref Range Status   Specimen Description PLEURAL RIGHT  Final   Special Requests NONE  Final   Culture NO GROWTH 5 DAYS  Final   Report Status 09/03/2016 FINAL  Final   Time coordinating discharge: 35 minutes  SIGNED: Standley Dakins, MD  [facility] 09/06/2016, 11:34 AM Pager [phone]    If 7PM-7AM, please contact night-coverage [email or web address] Password [email or web address]

## 2016-09-06 NOTE — Clinical Social Work Note (Signed)
Clinical Social Worker facilitated patient discharge including contacting patient family and facility [facility]) to confirm patient discharge plans.  Clinical information faxed to facility and family agreeable with plan.  CSW arranged ambulance transport via [facility] to [facility].  RN to call report prior to discharge.  Clinical Social Worker will sign off for now as social work intervention is no longer needed. Please consult us again if new need arises.  Orhan Mayorga B. Gean QuintBrown,MSW, LCSWA Clinical Social Work Dept Weekend Social Worker [phone] 1:15 PM

## 2016-09-06 NOTE — Progress Notes (Signed)
Pt discharged in stable condition into the care of EMS for transport to [facility].  Report [facility].  PIV (x2) removed intact w/o S&S of complications. Discharge instructions sent with pt.

## 2016-09-06 NOTE — Progress Notes (Signed)
DC to SNF as facilitated by CSW.  

## 2016-09-06 NOTE — Progress Notes (Signed)
PROGRESS NOTE    Ronald Simpson  DEY:[id number] DOB: Mar 19, 1946 DOA: 08/26/2016 PCP: Hendricks Limes, MD   Brief Narrative: Ronald Simpson is a 71 y.o. male with a past mental history significant of chronic anemia, chronic combined systolic and diastolic heart failure, cirrhosis, HCV, CKD stage III, CVA, diabetes mellitus, insulin-dependent diabetes mellitus, paroxysmal atrial fibrillation patient presented with altered mental status found to have cardiogenic and septic shock secondary to pneumoniae UTI in addition to CHF exacerbation. Patient required intubation and pressors for support. He was weaned off of pressors and extubated on May 17. He has been receiving diuresis for his heart failure exacerbation with improvement in respiratory status. He has been having pleural effusions and had thoracentesis performed on 5/19. Received aggressive diuresis with the help of the heart failure team.  Assessment & Plan:   Active Problems:   Respiratory failure with hypoxia (HCC)   Acute on chronic systolic heart failure (HCC)   Acute respiratory failure (HCC)   Septic shock (HCC)   Cardiogenic shock (HCC)   Status post thoracentesis   Acute respiratory failure with hypoxia - resolved now Multifactorial from pleural effusions and pneumonia. Extubated on 5/17. S/p thoracentesis on 5/19 with 1.2L out. Initial studies suggest exudative fluid. Clinically much improved. Chest x-ray with persistent effusions -continue oxygen therapy as needed.  -fluid culture (no growth x5 days)/cytology; reactive mesothelial cells, no malignant cells identified.   -treated pneumonia, completed doxycycline -PT recommendations: SNF  Acute on chronic systolic heart failure Ischemic cardiomyopathy EF of 35-40% with diffuse hypokinesis. Improvement with lasix therapy. -cardiology recommendations: off milrinone, transfer to tele 5/25, started entresto 5/25, BP much better controlled.   Pleural effusions S/p  thoracentesis on 5/19. Fluid study suggests exudative. -cytology:  reactive mesothelial cells, no malignant cells identified  Sepsis secondary to pneumonia/UTI Septic shock Sepsis physiology resolved. Urine significant for yeast. Will cover for aspiration/MRSA. -completed Doxycycline -completed fluconazole  Elevated troponin Likely secondary to CHF exacerbation. Flat trend.  Ulcer on tip of penis - Question if this is a herpes lesion, herpes testing pending. Continue valtrex. Symptoms improving per patient.  Cirrhosis Chronic hepatitis C Stable. Compensated.  Chronic anemia Secondary to chronic disease. Stable. No evidence of bleeding.  Left foot surgical wound Currently wrapped. Afebrile.  Continue local wound care per nursing staff.  -continue Norco prn  Hypothyroidism -stable, continue synthroid  Diabetes Mellitus type 2 with complications - well controlled on supplemental sliding scale, following CBG (last 3)   Recent Labs  09/05/16 1648 09/05/16 2100 09/06/16 0614  GLUCAP 143* 158* 169*   Depression -Continue Zoloft  Acute kidney injury Baseline appears to be 1.1. Cr back to baseline.  Continues to improve with diuresis. -Monitoring closely -diuresis per cardiology  DVT prophylaxis: Heparin subcutaneous Code Status: Full code Family Communication: None at bedside Disposition Plan: Discharge to SNF possibly later today if bed available  Consultants:   Cardiology  Procedures:   Thoracentesis (5/19)  Antimicrobials:  Vancomycin  Zosyn  Augmentin (5/20>>5/23)  Doxycycline (5/20>>  Fluconazole (5/18>>5/22)  Valtrex (5/24  Subjective: Pt says that he does feel better, still pain with urination.   Objective: Vitals:   09/05/16 1502 09/05/16 1953 09/06/16 0500 09/06/16 0850  BP:   122/82   Pulse:   87   Resp:   18   Temp:   98.7 F (37.1 C)   TempSrc:   Oral   SpO2: 100% 99% 98% 92%  Weight:   71.7 kg (158 lb)   Height:  Intake/Output Summary (Last 24 hours) at 09/06/16 0944 Last data filed at 09/05/16 1124  Gross per 24 hour  Intake              120 ml  Output              100 ml  Net               20 ml   Filed Weights   09/04/16 0318 09/05/16 0452 09/06/16 0500  Weight: 74.1 kg (163 lb 5.8 oz) 72.4 kg (159 lb 9.8 oz) 71.7 kg (158 lb)    Examination:  General exam: Appears calm and comfortable Respiratory system: No crackles heard. Respiratory effort normal. Cardiovascular system: S1 & S2 heard. No murmurs. Gastrointestinal system: Abdomen is nondistended, soft and nontender. Hypoactive bowel sounds heard. Central nervous system: Alert and oriented. No focal neurological deficits. GU: ulcerated lesion on tip of penis at the urethral meatus unchanged.  Extremities: Trace edema thigh edema. No calf tenderness. Right AKA. Left foot wounds clean and dry.  Skin: No cyanosis. No rashes Psychiatry: Judgement and insight appear normal. Mood & affect appropriate. No agitation.   Data Reviewed: I have personally reviewed following labs and imaging studies  CBC:  Recent Labs Lab 09/02/16 0531 09/03/16 0450 09/04/16 0237 09/05/16 0500 09/06/16 0500  WBC 7.3 6.0 6.7 8.1 9.4  HGB 9.5* 9.6* 9.9* 10.8* 12.3*  HCT 30.9* 31.6* 32.4* 34.6* 38.6*  MCV 84.9 85.6 85.0 84.6 85.2  PLT 218 230 256 274 482   Basic Metabolic Panel:  Recent Labs Lab 09/02/16 0531 09/03/16 0450 09/04/16 0237 09/05/16 0500 09/06/16 0500  NA 131* 132* 132* 132* 133*  K 4.0 4.5 4.3 4.0 3.8  CL 97* 99* 96* 92* 91*  CO2 26 28 29 31  33*  GLUCOSE 160* 138* 144* 201* 170*  BUN 43* 39* 34* 34* 32*  CREATININE 1.36* 1.25* 1.18 1.22 1.21  CALCIUM 8.6* 8.6* 8.8* 9.1 9.0   GFR: Estimated Creatinine Clearance: 56.5 mL/min (by C-G formula based on SCr of 1.21 mg/dL). Liver Function Tests: No results for input(s): AST, ALT, ALKPHOS, BILITOT, PROT, ALBUMIN in the last 168 hours. Coagulation Profile: No results for input(s):  INR, PROTIME in the last 168 hours. Cardiac Enzymes: No results for input(s): CKTOTAL, CKMB, CKMBINDEX, TROPONINI in the last 168 hours. BNP (last 3 results) No results for input(s): PROBNP in the last 8760 hours. HbA1C: No results for input(s): HGBA1C in the last 72 hours. CBG:  Recent Labs Lab 09/03/16 2127 09/04/16 1615 09/05/16 1648 09/05/16 2100 09/06/16 0614  GLUCAP 132* 116* 143* 158* 169*   Sepsis Labs:  Recent Labs Lab 08/31/16 0551 09/02/16 0531  PROCALCITON 2.17 0.54    Recent Results (from the past 240 hour(s))  Gram stain     Status: None   Collection Time: 08/29/16 11:40 AM  Result Value Ref Range Status   Specimen Description PLEURAL RIGHT  Final   Special Requests NONE  Final   Gram Stain   Final    WBC PRESENT,BOTH PMN AND MONONUCLEAR NO ORGANISMS SEEN    Report Status 08/29/2016 FINAL  Final  Culture, body fluid-bottle     Status: None   Collection Time: 08/29/16 11:40 AM  Result Value Ref Range Status   Specimen Description PLEURAL RIGHT  Final   Special Requests NONE  Final   Culture NO GROWTH 5 DAYS  Final   Report Status 09/03/2016 FINAL  Final    Radiology Studies: No results found.  Scheduled Meds: . aspirin  81 mg Per Tube Daily  . atorvastatin  20 mg Per Tube q1800  . carvedilol  6.25 mg Oral BID WC  . chlorhexidine gluconate (MEDLINE KIT)  15 mL Mouth Rinse BID  . collagenase   Topical Daily  . digoxin  0.125 mg Oral Daily  . heparin  5,000 Units Subcutaneous Q8H  . insulin aspart  0-5 Units Subcutaneous QHS  . insulin aspart  0-9 Units Subcutaneous TID WC  . ipratropium-albuterol  3 mL Nebulization TID  . levothyroxine  25 mcg Oral QAC breakfast  . mupirocin ointment   Nasal BID  . pantoprazole  40 mg Oral QHS  . polyethylene glycol  17 g Oral Daily  . sacubitril-valsartan  1 tablet Oral BID  . senna  1 tablet Oral Daily  . sertraline  25 mg Oral Daily  . spironolactone  25 mg Oral Daily  . torsemide  60 mg Oral Daily  .  valACYclovir  1,000 mg Oral BID   Continuous Infusions: . sodium chloride Stopped (09/04/16 2000)    LOS: 10 days   Irwin Brakeman, MD [facility] 09/06/2016, 9:44 AM Pager: [phone]  If 7PM-7AM, please contact night-coverage [email or web address] Password [facility] 09/06/2016, 9:44 AM

## 2016-09-06 NOTE — Clinical Social Work Placement (Signed)
   CLINICAL SOCIAL WORK PLACEMENT  NOTE  Date:  09/06/2016  Patient Details  Name: [name] Avans MRN: [id number] Date of Birth: 02-01-1946  Clinical Social Work is seeking post-discharge placement for this patient at the Skilled  Nursing Facility level of care (*CSW will initial, date and re-position this form in  chart as items are completed):  Yes   Patient/family provided with [place] Clinical Social Work Department's list of facilities offering this level of care within the geographic area requested by the patient (or if unable, by the patient's family).  Yes   Patient/family informed of their freedom to choose among providers that offer the needed level of care, that participate in Medicare, Medicaid or managed care program needed by the patient, have an available bed and are willing to accept the patient.  Yes   Patient/family informed of [facility]'s ownership interest in [facility] and [facility], as well as of the fact that they are under no obligation to receive care at these facilities.  PASRR submitted to EDS on       PASRR number received on       Existing PASRR number confirmed on 09/02/16     FL2 transmitted to all facilities in geographic area requested by pt/family on 09/02/16     FL2 transmitted to all facilities within larger geographic area on       Patient informed that his/her managed care company has contracts with or will negotiate with certain facilities, including the following:        Yes   Patient/family informed of bed offers received.  Patient chooses bed at  (From [facility])     Physician recommends and patient chooses bed at      Patient to be transferred to  [facility] on  .  Patient to be transferred to facility by  Sharin Mons([facility])     Patient family notified on 09/06/16 of transfer.  Name of family member notified:  [name]     PHYSICIAN       Additional Comment:    _______________________________________________ Norlene DuelBROWN,  Sultan Pargas B, LCSWA 09/06/2016, 1:17 PM

## 2016-09-07 LAB — HSV CULTURE AND TYPING

## 2016-09-09 ENCOUNTER — Encounter: Payer: Self-pay | Admitting: Internal Medicine

## 2016-09-09 ENCOUNTER — Non-Acute Institutional Stay (SKILLED_NURSING_FACILITY): Payer: Medicare Other | Admitting: Internal Medicine

## 2016-09-09 DIAGNOSIS — R195 Other fecal abnormalities: Secondary | ICD-10-CM

## 2016-09-09 DIAGNOSIS — J90 Pleural effusion, not elsewhere classified: Secondary | ICD-10-CM

## 2016-09-09 DIAGNOSIS — E1151 Type 2 diabetes mellitus with diabetic peripheral angiopathy without gangrene: Secondary | ICD-10-CM | POA: Diagnosis not present

## 2016-09-09 DIAGNOSIS — I5043 Acute on chronic combined systolic (congestive) and diastolic (congestive) heart failure: Secondary | ICD-10-CM | POA: Diagnosis not present

## 2016-09-09 NOTE — Assessment & Plan Note (Signed)
Diabetes is well controlled, high risk of hypoglycemia  if oral intake is suboptimal

## 2016-09-09 NOTE — Assessment & Plan Note (Signed)
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

## 2016-09-09 NOTE — Assessment & Plan Note (Signed)
Continue diuresis with furosemide and Entresto as per cardiology

## 2016-09-09 NOTE — Progress Notes (Signed)
NURSING HOME LOCATION:  [facility] ROOM NUMBER:  113-A CODE STATUS:  Full Code  PCP:  Pecola Lawless, MD  [address] [place] [address]  This is a nursing facility follow up for Nursing Facility readmission within 30 days  Interim medical record and care since last [facility] visit was updated with review of diagnostic studies and change in clinical status since last visit were documented.  HPI: The patient was hospitalized 5/15-5/26/18 admitted with altered consciousness, dyspnea, and hypoglycemia. CBGs were initially in the 30s. Repeat CBG in the ED was in the mid 60s. The patient was intubated for respiratory distress. Chest x-ray revealed pulmonary edema and bilateral pleural effusions. He had been hospitalized 4/21-4/26/18 with bilateral effusions as well as possible pericardial effusion. Thoracentesis fluid removed 4/23 was transudate attributed to congestive heart failure and cirrhosis in the context of chronic hepatitis C. Central venous line was placed for hypotension attributed to cardiogenic and septic shock. Elevated troponins were attributed to his heart failure. Clinically pneumonia and urinary tract infection were suggested. The patient received vancomycin, Zosyn, Augmentin, and doxycycline while hospitalized. The patient was extubated on 5/17. Aggressive diuresis was employed for combined diastolic and systolic heart failure. Ejection fraction was 35-40 percent with diffuse hypokinesis. Sherryll Burger 5/25 was initiated Thoracentesis 5/18 resulted in removal of 1.2 L. Exudative ,not transudative ,etiology was suggested. No malignant cells were found, culture was negative at 5 days. The patient also had a penile ulcer; Valtrex was prescribed with clinical improvement.HSV culture was negative. Wound care was continued for the left foot surgical wound. He received sliding scale insulin, prior to discharge fasting glucoses ranged 143-169. He sustained acute kidney  injury but creatinine was at baseline of 1.21 at discharge. The patient has chronic anemia and has had serious GI bleeds. He was on heparin subcutaneously as DVT prophylaxis. Labs at discharge revealed creatinine of 1.21, BUN 32, hemoglobin 12.3/hematocrit 38.6. Hemoglobin @ admission was 9.7/hematocrit 31.3. All transfusion data reviewed related to September 2017 admission. The most recent chest x-ray was a portable done 5/22, this revealed improving aeration with persistent effusions.  Review of systems: Review of systems is reliable as he is oriented 3. He was encephalopathic while in the hospital. He describes a cough with production of thick brown sputum occasionally. He states he has some difficulty urinating and must strain. He had loose-watery stool with incontinence with straining @ urination. He also describes pain in the left foot.  Constitutional: No fever Eyes: No redness, discharge, pain, vision change ENT/mouth: No nasal congestion,  purulent discharge, earache,change in hearing ,sore throat  Cardiovascular: No chest pain, palpitations,paroxysmal nocturnal dyspnea, claudication, edema  Respiratory: No hemoptysis, significant snoring,apnea  Gastrointestinal: No heartburn,dysphagia,abdominal pain, nausea / vomiting,rectal bleeding, melena Genitourinary: No dysuria,hematuria, pyuria,  incontinence, nocturia Musculoskeletal: No joint stiffness, joint swelling, weakness Dermatologic: No rash, pruritus, change in appearance of skin Neurologic: No dizziness,headache,syncope, seizures, numbness , tingling Psychiatric: No significant anxiety , depression, insomnia, anorexia Endocrine: No change in hair/skin/ nails, excessive thirst, excessive hunger, excessive urination  Hematologic/lymphatic: No significant bruising, lymphadenopathy,abnormal bleeding Allergy/immunology: No itchy/ watery eyes, significant sneezing, urticaria, angioedema  Physical exam:  Pertinent or positive findings:As  noted he's alert and oriented. Globes are prominent without definite proptosis. He is not wearing his upper plate, he has multiple missing lower teeth. Mustache is present. Rhythm is slow and somewhat irregular with occasional premature beats. Respirations have somewhat of a raspy quality to them without increased work of breathing. Xiphoid process is  markedly pronounced. 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 Pedal pulses are absent on the left. The left foot is surgically dressed. Toenails on the left are thickened. He has interosseous wasting. He has a large tattoo over the right chest.   General appearance:thin but adequately nourished; no acute distress , increased work of breathing is present.   Lymphatic: No lymphadenopathy about the head, neck, axilla . Eyes: No conjunctival inflammation or lid edema is present. There is no scleral icterus. Ears:  External ear exam shows no significant lesions or deformities.   Nose:  External nasal examination shows no deformity or inflammation. Nasal mucosa are pink and moist without lesions ,exudates Oral exam: lips and gums are healthy appearing.There is no oropharyngeal erythema or exudate . Neck:  No thyromegaly, masses, tenderness noted.    Heart:  No gallop, murmur, click, rub .  Lungs:without wheezes, rhonchi, rubs. Abdomen:Bowel sounds are normal. Abdomen is soft and nontender with no organomegaly, hernias,masses. GU: deferred  Extremities:  No cyanosis, clubbing,edema  Neurologic exam : Balance,Rhomberg,finger to nose testing could not be completed due to clinical state Skin: Warm & dry w/o tenting. No significant rash.  See summary under each active problem in the Problem List with associated updated therapeutic plan

## 2016-09-09 NOTE — Patient Instructions (Signed)
See Current Assessment & Plan in Problem List under specific Diagnosis 

## 2016-09-12 LAB — GLUCOSE, CAPILLARY
GLUCOSE-CAPILLARY: 148 mg/dL — AB (ref 65–99)
GLUCOSE-CAPILLARY: 167 mg/dL — AB (ref 65–99)
GLUCOSE-CAPILLARY: 150 mg/dL — AB (ref 65–99)
GLUCOSE-CAPILLARY: 161 mg/dL — AB (ref 65–99)
Glucose-Capillary: 144 mg/dL — ABNORMAL HIGH (ref 65–99)
Glucose-Capillary: 191 mg/dL — ABNORMAL HIGH (ref 65–99)
Glucose-Capillary: 153 mg/dL — ABNORMAL HIGH (ref 65–99)
Glucose-Capillary: 136 mg/dL — ABNORMAL HIGH (ref 65–99)
Glucose-Capillary: 157 mg/dL — ABNORMAL HIGH (ref 65–99)

## 2016-09-15 ENCOUNTER — Telehealth (HOSPITAL_COMMUNITY): Payer: Self-pay | Admitting: Surgery

## 2016-09-15 NOTE — Telephone Encounter (Signed)
I called [name] regarding his recent hospitalization.  I reminded him of his appointment on Wednesday June 6th at 2:30 in the [facility].  [facility] and will need transportation from there.  I will contact [facility] to remind them of appointment and the need for transportation.

## 2016-09-16 ENCOUNTER — Encounter: Payer: Self-pay | Admitting: Internal Medicine

## 2016-09-16 ENCOUNTER — Non-Acute Institutional Stay (SKILLED_NURSING_FACILITY): Payer: Medicare Other | Admitting: Internal Medicine

## 2016-09-16 DIAGNOSIS — R627 Adult failure to thrive: Secondary | ICD-10-CM

## 2016-09-16 DIAGNOSIS — J90 Pleural effusion, not elsewhere classified: Secondary | ICD-10-CM | POA: Diagnosis not present

## 2016-09-16 NOTE — Patient Instructions (Signed)
See assessment and plan under each diagnosis in the problem list and acutely for this visit 

## 2016-09-16 NOTE — Progress Notes (Signed)
   NURSING HOME LOCATION:  [facility] ROOM NUMBER:  113-A  CODE STATUS:  Full code  PCP:  [name], William F, MD  [address] [place] [id number]   This is a nursing facility follow up for specific acute issue of adult failure to thrive.  Interim medical record and care since last [facility] visit was updated with review of diagnostic studies and change in clinical status since last visit were documented.  HPI: PT/OT states the patient has not been participating in exercises as recommended. He spends most of his time in bed  He states that he has some shortness of breath but he has refused nebulized treatment when offered. He states he has occasional gray sputum. Previously he described brown sputum.He denies other cardiopulmonary, GI, GU symptoms. Despite his lack of participation in PT/OT he denies depression.  Review of systems:  Constitutional: No fever,significant weight change since hospital discharge  Eyes: No redness, discharge, pain, vision change ENT/mouth: No nasal congestion,  purulent discharge, earache,change in hearing ,sore throat  Cardiovascular: No chest pain, palpitations,paroxysmal nocturnal dyspnea, claudication, edema  Respiratory: No hemoptysis, significant snoring,apnea  Gastrointestinal: No heartburn,dysphagia,abdominal pain, nausea / vomiting,rectal bleeding, melena,change in bowels Genitourinary: No dysuria,hematuria, pyuria,  incontinence, nocturia Musculoskeletal: No joint stiffness, joint swelling,pain Dermatologic: No rash, pruritus, change in appearance of skin Neurologic: No dizziness,headache,syncope, seizures, numbness , tingling Psychiatric: No significant anxiety , depression, insomnia, anorexia Endocrine: No change in hair/skin/ nails, excessive thirst, excessive hunger, excessive urination  Hematologic/lymphatic: No significant bruising, lymphadenopathy,abnormal bleeding Allergy/immunology: No itchy/ watery eyes, significant  sneezing, urticaria, angioedema  Physical exam:  Pertinent or positive findings: He appears suboptimally nourished. Affect is flat. Pattern alopecia is present. He has a mustache. He has a few remaining mandibular teeth. A slight murmur is noted at the left base. Breath sounds are decreased with minimal rales. Abdomen is soft but there is dullness in right upper quadrant to percussion. Fusiform changes are present in the left knee. 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 There is atrophy of the left calf. The left foot is dressed. There is thickening and discoloration of the left great toenail and the third toenail. The fourth and fifth toes are absent on the left.  General appearance:no acute distress , increased work of breathing is present.   Lymphatic: No lymphadenopathy about the head, neck, axilla . Eyes: No conjunctival inflammation or lid edema is present. There is no scleral icterus. Ears:  External ear exam shows no significant lesions or deformities.   Nose:  External nasal examination shows no deformity or inflammation. Nasal mucosa are pink and moist without lesions ,exudates Neck:  No thyromegaly, masses, tenderness noted.    Heart:  Normal rate and regular rhythm. S1 and S2 normal without gallop, click, rub .  Abdomen:Bowel sounds are normal. Abdomen is soft and nontender with no palpable organomegaly, hernias,masses. GU: deferred  Extremities:  No cyanosis, clubbing,edema  Neurologic exam : Strength equal  in upper extremities Balance,Rhomberg,finger to nose testing could not be completed due to clinical state Skin: Warm & dry w/o tenting. No significant  rash.  See summary under each active problem in the Problem List with associated updated therapeutic plan

## 2016-09-16 NOTE — Assessment & Plan Note (Signed)
Recheck labs After expression of my concerns and encouragement, he agrees to participate in PT/OT

## 2016-09-16 NOTE — Assessment & Plan Note (Signed)
Recheck portable CXR

## 2016-09-17 ENCOUNTER — Encounter: Payer: Self-pay | Admitting: Vascular Surgery

## 2016-09-17 ENCOUNTER — Ambulatory Visit (HOSPITAL_COMMUNITY)
Admit: 2016-09-17 | Discharge: 2016-09-17 | Disposition: A | Discharge status: Home / Self Care | Payer: Medicare Other | Attending: Internal Medicine | Admitting: Internal Medicine

## 2016-09-17 ENCOUNTER — Encounter: Payer: Self-pay | Admitting: Internal Medicine

## 2016-09-17 VITALS — BP 160/90 | HR 74

## 2016-09-17 DIAGNOSIS — N183 Chronic kidney disease, stage 3 unspecified: Secondary | ICD-10-CM

## 2016-09-17 DIAGNOSIS — I10 Essential (primary) hypertension: Secondary | ICD-10-CM

## 2016-09-17 DIAGNOSIS — I5042 Chronic combined systolic (congestive) and diastolic (congestive) heart failure: Secondary | ICD-10-CM | POA: Diagnosis not present

## 2016-09-17 DIAGNOSIS — I480 Paroxysmal atrial fibrillation: Secondary | ICD-10-CM

## 2016-09-17 DIAGNOSIS — I2510 Atherosclerotic heart disease of native coronary artery without angina pectoris: Secondary | ICD-10-CM

## 2016-09-17 MED ORDER — SACUBITRIL-VALSARTAN 49-51 MG PO TABS: 1.0000 | ORAL_TABLET | Freq: Two times a day (BID) | ORAL | 6 refills | Status: DC

## 2016-09-17 NOTE — Patient Instructions (Signed)
INCREASE Entresto to 49/51 mg, one tab twice daily  Your physician recommends that you schedule a follow-up appointment in: 2 months

## 2016-09-17 NOTE — Progress Notes (Signed)
Advanced Heart Failure Clinic Note   Referring Physician:  Primary Care: Primary Cardiologist:  HPI: Mr. Westling is a 71 year old male with a past medical history of ICM, chronic combined systolic and diastolic CHF (EF 16-10%), CAD with remote CABG, mitral regurgitation s/p prior annular ring prothesis, CVA, DM, Hep C/ETOH cirrhosis, PVD, right LE amputation, PAF (not on anticoagulation likely due to h/o IC hemorrhage, prior GIB, cirrhosis).   Admitted 08/02/16-08/07/16 with pleuritic chest pain, d-dimer was elevated at 8, found to have bilateral pleural effusions and pericardial rub. CT also showed abnormality in the LAA which was felt due to prior clipping of the appendage at time of CABG, no further workup recommended. Symptoms significantly improved with thoracentesis 08/04/16 for his effusions as well as colchicine for pleuritic pain. Path neg for malignancy. Echo that admission showed an EF of 35-40%, diffuse HK worse in inferior and inferolateral walls, diastolic and systolic flattening of ventricular septum, moderate mitral regurgitation. He was discharged to [facility] rehab. Discharge weight was 167 pounds.   Admitted on 08/26/16 with decreased LOC and hypoglycemia (CBG 30). Developed respiratory distress and was intubated. Chest x ray showed bilateral pleural effusions, pulmonary edema. He was hypotensive, so CVL was placed and Levophed started.  CT chest showed large right pleural effusions and LUL PNA. Had thoracentesis on 5/18, exudative fluid. Echo with EF 35-40%, moderate RV dysfunction. Started on milrinone 08/31/16 for RV support.  Milrinone was weaned off on 09/06/16. He was discharged to SNF on 09/06/16, discharge weight was 158 pounds.   He presents today for post hospital follow up. Weight at SNF yesterday was 158 pounds. He is feeling ok, seems depressed about being at [facility]. He is not SOB at rest, he can transfer from wheelchair to bed without SOB. SNF note says that he is not  participating in PT. I asked him about this and he says that he is working with PT. Eating food at SNF, but says he does not particularly watch his salt intake. Denies chest pain, palpitations and SOB.     Review of Systems: [y] = yes, [ ]  = no   General: Weight gain [ ] ; Weight loss [ ] ; Anorexia [ ] ; Fatigue [ y]; Fever [ ] ; Chills [ ] ; Weakness [ ]   Cardiac: Chest pain/pressure [ ] ; Resting SOB [ ] ; Exertional SOB [ ] ; Orthopnea [ ] ; Pedal Edema [ ] ; Palpitations [ ] ; Syncope [ ] ; Presyncope [ ] ; Paroxysmal nocturnal dyspnea[ ]   Pulmonary: Cough [ ] ; Wheezing[ ] ; Hemoptysis[ ] ; Sputum [ ] ; Snoring [ ]   GI: Vomiting[ ] ; Dysphagia[ ] ; Melena[ ] ; Hematochezia [ ] ; Heartburn[ ] ; Abdominal pain [ ] ; Constipation [ ] ; Diarrhea [ ] ; BRBPR [ ]   GU: Hematuria[ ] ; Dysuria [ ] ; Nocturia[ ]   Vascular: Pain in legs with walking [ ] ; Pain in feet with lying flat [ ] ; Non-healing sores [ ] ; Stroke [ ] ; TIA [ ] ; Slurred speech [ ] ;  Neuro: Headaches[ ] ; Vertigo[ ] ; Seizures[ ] ; Paresthesias[ ] ;Blurred vision [ ] ; Diplopia [ ] ; Vision changes [ ]   Ortho/Skin: Arthritis [place] ]; Joint pain [ ] ; Muscle pain [place]ienne ]; Joint swelling [ ] ; Back Pain [ ] ; Rash [ ]   Psych: Depression[y ]; Anxiety[ ]   Heme: Bleeding problems [ ] ; Clotting disorders [ ] ; Anemia [ ]   Endocrine: Diabetes [ ] ; Thyroid dysfunction[ ]    Past Medical History:  Diagnosis Date  . CAD (coronary artery disease)    a. remote CABG.  . Chronic anemia   .  Chronic combined systolic and diastolic CHF (congestive heart failure) (HCC)   . Cirrhosis (HCC)   . CKD (chronic kidney disease), stage III   . CVA (cerebral infarction)   . DM (diabetes mellitus), type 2 with peripheral vascular complications (HCC)   . History of hepatitis C 1990s   Hep B immune  . Hypoalbuminemia   . IDDM (insulin dependent diabetes mellitus) (HCC)   . Mitral regurgitation    a. s/p prior ring annuloplasty prosthesis per echo with mod MR by echo 07/2016.  Marland Kitchen. PAF  (paroxysmal atrial fibrillation) (HCC)    a. not on anticoagulation likely due to h/o IC hemorrhage, prior GIB, cirrhosis. b. suspected prior LAA clipping based on imaging.  . Peripheral neuropathy   . Peripheral vascular disease (HCC)   . Proteinuria     Current Outpatient Prescriptions  Medication Sig Dispense Refill  . acetaminophen (TYLENOL) 325 MG tablet Take 650 mg by mouth every 6 (six) hours as needed.    . Amino Acids-Protein Hydrolys (FEEDING SUPPLEMENT, PRO-STAT SUGAR FREE 64,) LIQD Take 30 mLs by mouth 2 (two) times daily.    Marland Kitchen. aspirin EC 81 MG EC tablet Take 1 tablet (81 mg total) by mouth daily. 30 tablet 1  . atorvastatin (LIPITOR) 20 MG tablet Take 20 mg by mouth daily.    . barrier cream (NON-SPECIFIED) CREA Apply 1 application topically 2 (two) times daily.    . carvedilol (COREG) 6.25 MG tablet Take 6.25 mg by mouth 2 (two) times daily with a meal.    . colchicine 0.6 MG tablet Take 1 tablet (0.6 mg total) by mouth daily. 30 tablet 0  . digoxin (LANOXIN) 0.125 MG tablet Take 1 tablet (0.125 mg total) by mouth daily.    . feeding supplement, ENSURE ENLIVE, (ENSURE ENLIVE) LIQD Take 237 mLs by mouth 2 (two) times daily between meals. 237 mL 12  . ferrous sulfate 325 (65 FE) MG tablet Take 1 tablet (325 mg total) by mouth daily with breakfast.  3  . insulin glargine (LANTUS) 100 unit/mL SOPN Inject 0.08 mLs (8 Units total) into the skin at bedtime. 15 mL 11  . ipratropium-albuterol (DUONEB) 0.5-2.5 (3) MG/3ML SOLN Take 3 mLs by nebulization 3 (three) times daily. May also use via nebulizer every 6 hours as needed for shortness of breath    . levothyroxine (SYNTHROID, LEVOTHROID) 25 MCG tablet Take 25 mcg by mouth daily before breakfast.    . nitroGLYCERIN (NITROSTAT) 0.4 MG SL tablet Place 0.4 mg under the tongue every 5 (five) minutes as needed for chest pain.    . OXYGEN Inhale into the lungs. 2lpm via [place] prn for shortness of breath or O2 sat <90%    . pantoprazole  (PROTONIX) 40 MG tablet Take 1 tablet (40 mg total) by mouth daily. 30 tablet 3  . saccharomyces boulardii (FLORASTOR) 250 MG capsule Take 250 mg by mouth 2 (two) times daily.    Marland Kitchen. senna (SENOKOT) 8.6 MG TABS tablet Take 1 tablet (8.6 mg total) by mouth daily as needed for mild constipation. 120 each 0  . sertraline (ZOLOFT) 25 MG tablet Take 25 mg by mouth daily.    Marland Kitchen. spironolactone (ALDACTONE) 25 MG tablet Take 1 tablet (25 mg total) by mouth daily.    Marland Kitchen. torsemide (DEMADEX) 20 MG tablet Take 3 tablets (60 mg total) by mouth daily.    . valACYclovir (VALTREX) 1000 MG tablet Take 1,000 mg by mouth 2 (two) times daily.    . sacubitril-valsartan (  ENTRESTO) 49-51 MG Take 1 tablet by mouth 2 (two) times daily. 60 tablet 6   No current facility-administered medications for this encounter.     Allergies  Allergen Reactions  . Hydralazine Other (See Comments)    4/21-4/26/18 pleuritic chest pain with bilateral effusions and pericardial rub. Rule out hydralazine drug-induced lupus   . Percocet [Oxycodone-Acetaminophen] Other (See Comments)    Needing narcan on multiple occasions due to oversedation after getting percocet   . Tramadol Other (See Comments)    Excess sedation; ? Related to pre-existing hepatic dysfunction with PMH Hep C      Social History   Social History  . Marital status: Widowed    Spouse name: N/A  . Number of children: N/A  . Years of education: N/A   Occupational History  . Not on file.   Social History Main Topics  . Smoking status: Current Every Day Smoker    Packs/day: 0.10    Years: 54.00    Types: Cigarettes  . Smokeless tobacco: Never Used  . Alcohol use Yes     Comment: 04/02/2016 "aint suppose to drink in the nursing home"  . Drug use: Yes    Types: Cocaine, Marijuana     Comment: marijuana use daily, rare cocain use.  Previously did snort and inject drugs through 2017. Hasn't had access to street drugs from 10/201 7 through February/201 8  . Sexual  activity: Not Currently   Other Topics Concern  . Not on file   Social History Narrative   As of fall/2017 patient had been living in [place] but then entered SNF in [place]. His wife passed away in summer 2017.      Family History  Problem Relation Age of Onset  . Hypertension Other   . Diabetes Other   . Mental illness Other   . Lupus Other   . Diabetes Father   . Cancer Neg Hx   . Heart disease Neg Hx   . Stroke Neg Hx     Vitals:   09/17/16 1547  BP: (!) 160/90  Pulse: 74  SpO2: 98%     PHYSICAL EXAM: General:  Well appearing. No respiratory difficulty, arrived in wheelchair.  HEENT: Poor dentition  Neck: supple. no JVD. Carotids 2+ bilat; no bruits. No lymphadenopathy or thyromegaly appreciated. Cor: PMI nondisplaced. Regular rate & rhythm. No rubs, gallops or murmurs. Lungs: clear bilaterally. Normal effort.  Abdomen: soft, nontender, nondistended. No hepatosplenomegaly. No bruits or masses. Good bowel sounds. Extremities: no cyanosis, clubbing, rash, edema. S/p R BKA.  Neuro: alert & oriented x 3, cranial nerves grossly intact. moves all 4 extremities w/o difficulty. Affect pleasant.     ASSESSMENT & PLAN: 1. Chronic combined systolic and diastolic CHF: EF 52-84%, moderate RV dysfunction. ICM.  - Continue torsemide 60mg  daily - Increase Entresto 97/103 mg BID. BP is 160/90 today.  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 CAD s/p CABG - Continue ASA and atorvastatin - Denies chest pain 3. Pleural effusion - S/p right thoracentesis. - Stable 4. PAF - Regular rate and rhythm - Not on anticoagulation with history of GI bleeds 5. CKD stage III - BMET today and in 10 days after increasing Entresto 6. DM - Per PCP    Follow up in one month.   Little Ishikawa, NP 09/17/16

## 2016-09-18 LAB — BASIC METABOLIC PANEL
BUN: 52 — AB (ref 4–21)
Creatinine: 1 (ref 0.6–1.3)
Glucose: 146
Potassium: 4.5 (ref 3.4–5.3)
SODIUM: 136 — AB (ref 137–147)

## 2016-09-18 LAB — CBC AND DIFFERENTIAL
HEMATOCRIT: 40 — AB (ref 41–53)
Hemoglobin: 12.1 — AB (ref 13.5–17.5)
PLATELETS: 189 (ref 150–399)
WBC: 4.4

## 2016-09-23 ENCOUNTER — Ambulatory Visit (INDEPENDENT_AMBULATORY_CARE_PROVIDER_SITE_OTHER): Payer: Medicare Other | Admitting: Vascular Surgery

## 2016-09-23 ENCOUNTER — Encounter: Payer: Self-pay | Admitting: Vascular Surgery

## 2016-09-23 VITALS — BP 136/93 | HR 76 | Temp 95.1°F | Resp 20 | Ht 68.0 in | Wt 145.0 lb

## 2016-09-23 DIAGNOSIS — Z89611 Acquired absence of right leg above knee: Secondary | ICD-10-CM | POA: Diagnosis not present

## 2016-09-23 DIAGNOSIS — I2510 Atherosclerotic heart disease of native coronary artery without angina pectoris: Secondary | ICD-10-CM

## 2016-09-23 DIAGNOSIS — I739 Peripheral vascular disease, unspecified: Secondary | ICD-10-CM | POA: Diagnosis not present

## 2016-09-23 NOTE — Progress Notes (Signed)
[facility] [place]  Patient name: Ronald Simpson MRN: [id number] DOB: 04/11/1946 Sex: male  REASON FOR VISIT: Here today for follow-up of his open toe amputation site  HPI: Ronald Simpson is a 71 y.o. male here today for follow-up. Has continued failing health. Is alert and oriented. Is in a wheelchair today. He was admitted to [facility] one month ago with respiratory failure on mechanical ventilation for. Time and also sepsis been extremely low blood sugars. He has had continued slow healing of his amputation and is having local wound care at the nursing facility  Past Medical History:  Diagnosis Date  . CAD (coronary artery disease)    a. remote CABG.  . Chronic anemia   . Chronic combined systolic and diastolic CHF (congestive heart failure) (HCC)   . Cirrhosis (HCC)   . CKD (chronic kidney disease), stage III   . CVA (cerebral infarction)   . DM (diabetes mellitus), type 2 with peripheral vascular complications (HCC)   . History of hepatitis C 1990s   Hep B immune  . Hypoalbuminemia   . IDDM (insulin dependent diabetes mellitus) (HCC)   . Mitral regurgitation    a. s/p prior ring annuloplasty prosthesis per echo with mod MR by echo 07/2016.  Marland Kitchen PAF (paroxysmal atrial fibrillation) (HCC)    a. not on anticoagulation likely due to h/o IC hemorrhage, prior GIB, cirrhosis. b. suspected prior LAA clipping based on imaging.  . Peripheral neuropathy   . Peripheral vascular disease (HCC)   . Proteinuria     Family History  Problem Relation Age of Onset  . Hypertension Other   . Diabetes Other   . Mental illness Other   . Lupus Other   . Diabetes Father   . Cancer Neg Hx   . Heart disease Neg Hx   . Stroke Neg Hx     SOCIAL HISTORY: Social History  Substance Use Topics  . Smoking status: Former Smoker    Packs/day: 0.10    Years: 54.00    Types: Cigarettes    Quit date: 04/25/2016  . Smokeless tobacco:  Never Used  . Alcohol use Yes     Comment: 04/02/2016 "aint suppose to drink in the nursing home"    Allergies  Allergen Reactions  . Hydralazine Other (See Comments)    4/21-4/26/18 pleuritic chest pain with bilateral effusions and pericardial rub. Rule out hydralazine drug-induced lupus   . Percocet [Oxycodone-Acetaminophen] Other (See Comments)    Needing narcan on multiple occasions due to oversedation after getting percocet   . Tramadol Other (See Comments)    Excess sedation; ? Related to pre-existing hepatic dysfunction with PMH Hep C    Current Outpatient Prescriptions  Medication Sig Dispense Refill  . acetaminophen (TYLENOL) 325 MG tablet Take 650 mg by mouth every 6 (six) hours as needed.    . Amino Acids-Protein Hydrolys (FEEDING SUPPLEMENT, PRO-STAT SUGAR FREE 64,) LIQD Take 30 mLs by mouth 2 (two) times daily.    Marland Kitchen aspirin EC 81 MG EC tablet Take 1 tablet (81 mg total) by mouth daily. 30 tablet 1  . atorvastatin (LIPITOR) 20 MG tablet Take 20 mg by mouth daily.    . barrier cream (NON-SPECIFIED) CREA Apply 1 application topically 2 (two) times daily.    . carvedilol (COREG) 6.25 MG tablet Take 6.25 mg by mouth 2 (two) times daily with a meal.    . colchicine 0.6 MG tablet Take 1 tablet (0.6 mg total) by  mouth daily. 30 tablet 0  . digoxin (LANOXIN) 0.125 MG tablet Take 1 tablet (0.125 mg total) by mouth daily.    . feeding supplement, ENSURE ENLIVE, (ENSURE ENLIVE) LIQD Take 237 mLs by mouth 2 (two) times daily between meals. 237 mL 12  . ferrous sulfate 325 (65 FE) MG tablet Take 1 tablet (325 mg total) by mouth daily with breakfast.  3  . insulin glargine (LANTUS) 100 unit/mL SOPN Inject 0.08 mLs (8 Units total) into the skin at bedtime. 15 mL 11  . ipratropium-albuterol (DUONEB) 0.5-2.5 (3) MG/3ML SOLN Take 3 mLs by nebulization 3 (three) times daily. May also use via nebulizer every 6 hours as needed for shortness of breath    . levothyroxine (SYNTHROID, LEVOTHROID) 25  MCG tablet Take 25 mcg by mouth daily before breakfast.    . nitroGLYCERIN (NITROSTAT) 0.4 MG SL tablet Place 0.4 mg under the tongue every 5 (five) minutes as needed for chest pain.    . OXYGEN Inhale into the lungs. 2lpm via [place] prn for shortness of breath or O2 sat <90%    . pantoprazole (PROTONIX) 40 MG tablet Take 1 tablet (40 mg total) by mouth daily. 30 tablet 3  . saccharomyces boulardii (FLORASTOR) 250 MG capsule Take 250 mg by mouth 2 (two) times daily.    . sacubitril-valsartan (ENTRESTO) 49-51 MG Take 1 tablet by mouth 2 (two) times daily. 60 tablet 6  . senna (SENOKOT) 8.6 MG TABS tablet Take 1 tablet (8.6 mg total) by mouth daily as needed for mild constipation. 120 each 0  . sertraline (ZOLOFT) 25 MG tablet Take 25 mg by mouth daily.    Marland Kitchen. spironolactone (ALDACTONE) 25 MG tablet Take 1 tablet (25 mg total) by mouth daily.    Marland Kitchen. torsemide (DEMADEX) 20 MG tablet Take 3 tablets (60 mg total) by mouth daily.    . valACYclovir (VALTREX) 1000 MG tablet Take 1,000 mg by mouth 2 (two) times daily.     No current facility-administered medications for this visit.     REVIEW OF SYSTEMS:  [X]  denotes positive finding, [ ]  denotes negative finding Cardiac  Comments:  Chest pain or chest pressure:    Shortness of breath upon exertion:    Short of breath when lying flat:    Irregular heart rhythm:        Vascular    Pain in calf, thigh, or hip brought on by ambulation:    Pain in feet at night that wakes you up from your sleep:     Blood clot in your veins:    Leg swelling:           PHYSICAL EXAM: Vitals:   09/23/16 1110  BP: (!) 136/93  Pulse: 76  Resp: 20  Temp: (!) 95.1 F (35.1 C)  TempSrc: Oral  SpO2: 100%  Weight: 145 lb (65.8 kg)  Height: 5\' 8"  (1.727 m)    GENERAL: The patient is a well-nourished male, in no acute distress. The vital signs are documented above. CARDIOVASCULAR: Palpable left popliteal pulse. Absent pedal pulses. PULMONARY: There is good air  exchange  MUSCULOSKELETAL: Right above-knee amputation NEUROLOGIC: No focal weakness or paresthesias are detected. SKIN: Healing of the lateral aspect of his left foot wound over the fifth metatarsal head. Continues to have open ulceration over the fourth metatarsal head and at the lateral aspect of the third metatarsal head. PSYCHIATRIC: The patient has a normal affect.    MEDICAL ISSUES: Diminutive overall clinical deterioration and weight  loss. No option for reconstruction. Will need continued local wound care. Had the open amputation on 04/04/2016. Very slow healing related to poor nutritional status and somewhat compromised arterial flow. We'll see him again in 2 months for continued follow-up    Larina Earthly, MD [facility] Tel [phone] Pager [phone]

## 2016-09-26 LAB — ACID FAST CULTURE WITH REFLEXED SENSITIVITIES

## 2016-09-26 LAB — ACID FAST CULTURE WITH REFLEXED SENSITIVITIES (MYCOBACTERIA): Acid Fast Culture: NEGATIVE

## 2016-09-29 NOTE — Addendum Note (Signed)
Encounter addended by: Little IshikawaSmith, Lino Wickliff E, NP on: 09/29/2016  8:59 AM<BR>    Actions taken: LOS modified, Follow-up modified

## 2016-09-30 ENCOUNTER — Encounter: Payer: Self-pay | Admitting: Adult Health

## 2016-09-30 ENCOUNTER — Non-Acute Institutional Stay (SKILLED_NURSING_FACILITY): Payer: Medicare Other | Admitting: Adult Health

## 2016-09-30 DIAGNOSIS — E039 Hypothyroidism, unspecified: Secondary | ICD-10-CM | POA: Diagnosis not present

## 2016-09-30 DIAGNOSIS — D649 Anemia, unspecified: Secondary | ICD-10-CM | POA: Diagnosis not present

## 2016-09-30 DIAGNOSIS — I5042 Chronic combined systolic (congestive) and diastolic (congestive) heart failure: Secondary | ICD-10-CM | POA: Diagnosis not present

## 2016-09-30 DIAGNOSIS — Z8719 Personal history of other diseases of the digestive system: Secondary | ICD-10-CM | POA: Diagnosis not present

## 2016-09-30 DIAGNOSIS — J9611 Chronic respiratory failure with hypoxia: Secondary | ICD-10-CM | POA: Diagnosis not present

## 2016-09-30 DIAGNOSIS — I480 Paroxysmal atrial fibrillation: Secondary | ICD-10-CM

## 2016-09-30 DIAGNOSIS — E785 Hyperlipidemia, unspecified: Secondary | ICD-10-CM

## 2016-09-30 DIAGNOSIS — K5901 Slow transit constipation: Secondary | ICD-10-CM | POA: Diagnosis not present

## 2016-09-30 DIAGNOSIS — E1151 Type 2 diabetes mellitus with diabetic peripheral angiopathy without gangrene: Secondary | ICD-10-CM

## 2016-09-30 DIAGNOSIS — I25810 Atherosclerosis of coronary artery bypass graft(s) without angina pectoris: Secondary | ICD-10-CM

## 2016-09-30 DIAGNOSIS — F329 Major depressive disorder, single episode, unspecified: Secondary | ICD-10-CM

## 2016-09-30 DIAGNOSIS — M1A9XX0 Chronic gout, unspecified, without tophus (tophi): Secondary | ICD-10-CM

## 2016-09-30 DIAGNOSIS — F32A Depression, unspecified: Secondary | ICD-10-CM

## 2016-09-30 LAB — BASIC METABOLIC PANEL
BUN: 52 — AB (ref 4–21)
Creatinine: 1.4 — AB (ref 0.6–1.3)
GLUCOSE: 278
POTASSIUM: 5.1 (ref 3.4–5.3)
Sodium: 132 — AB (ref 137–147)

## 2016-09-30 NOTE — Progress Notes (Addendum)
DATE:  09/30/2016   MRN:  [id number]  BIRTHDAY: 03-25-1946  Facility:  Nursing Home Location:  [facility] Room Number: 113-A  LEVEL OF CARE:  SNF (31)  Contact Information    Name Relation Home Work Mobile   Glover,[name] Niece [phone]  [phone]   Soth,Clementine Mother [phone]     Deforest HoylesFarrar,Lisa Daughter   [phone]       Code Status History    Date Active Date Inactive Code Status Order ID Comments User Context   08/27/2016  2:20 AM 09/06/2016  5:35 PM Full Code [id number]  Kathlene Coteesai, Rahul P, PA-C ED   08/02/2016  8:29 PM 08/07/2016  6:48 PM Full Code [id number]  Michael Litterarter, Nikki, MD ED   05/15/2016  5:59 PM 05/21/2016  9:41 PM Full Code [id number]  Penny PiaVega, Orlando, MD Inpatient   04/02/2016  3:19 PM 04/08/2016  8:26 PM Full Code [id number]  Hyacinth MeekerAhmed, Tasrif, MD ED   12/27/2015  1:27 AM 01/14/2016  5:17 PM Full Code [id number]  Danford, Earl Liteshristopher P, MD Inpatient       Chief Complaint  Patient presents with  . Medical Management of Chronic Issues    Routine    HISTORY OF PRESENT ILLNESS:  This is a 71-YO male seen for a routine visit. He is a long-term care resident of [facility].  He has PMH of chronic anemia, chronic combined systolic and diastolic heart failure, cirrhosis, HCV, CKD stage III, CVA, diabetes mellitus, insulin-dependent diabetes mellitus, paroxysmal atrial fibrillation. He was recently discharged from physical therapy. He was seen in his room today and  did not verbalize any concerns.     PAST MEDICAL HISTORY:  Past Medical History:  Diagnosis Date  . CAD (coronary artery disease)    a. remote CABG.  . Chronic anemia   . Chronic combined systolic and diastolic CHF (congestive heart failure) (HCC)   . Cirrhosis (HCC)   . CKD (chronic kidney disease), stage III   . CVA (cerebral infarction)   . DM (diabetes mellitus), type 2 with peripheral vascular complications (HCC)   . History of hepatitis C 1990s     Hep B immune  . Hypoalbuminemia   . IDDM (insulin dependent diabetes mellitus) (HCC)   . Mitral regurgitation    a. s/p prior ring annuloplasty prosthesis per echo with mod MR by echo 07/2016.  Marland Kitchen. PAF (paroxysmal atrial fibrillation) (HCC)    a. not on anticoagulation likely due to h/o IC hemorrhage, prior GIB, cirrhosis. b. suspected prior LAA clipping based on imaging.  . Peripheral neuropathy   . Peripheral vascular disease (HCC)   . Proteinuria      CURRENT MEDICATIONS: Reviewed  Patient's Medications  New Prescriptions   No medications on file  Previous Medications   ACETAMINOPHEN (TYLENOL) 325 MG TABLET    Take 650 mg by mouth every 6 (six) hours as needed.   ASPIRIN EC 81 MG EC TABLET    Take 1 tablet (81 mg total) by mouth daily.   ATORVASTATIN (LIPITOR) 20 MG TABLET    Take 20 mg by mouth daily.   BARRIER CREAM (NON-SPECIFIED) CREA    Apply 1 application topically 2 (two) times daily.   CARVEDILOL (COREG) 6.25 MG TABLET    Take 6.25 mg by mouth 2 (two) times daily with a meal.   COLCHICINE 0.6 MG TABLET    Take 1 tablet (0.6 mg total) by mouth daily.   DIGOXIN (LANOXIN) 0.125 MG TABLET  Take 1 tablet (0.125 mg total) by mouth daily.   FERROUS SULFATE 325 (65 FE) MG TABLET    Take 1 tablet (325 mg total) by mouth daily with breakfast.   INSULIN GLARGINE (LANTUS) 100 UNIT/ML SOPN    Inject 0.08 mLs (8 Units total) into the skin at bedtime.   IPRATROPIUM-ALBUTEROL (DUONEB) 0.5-2.5 (3) MG/3ML SOLN    Take 3 mLs by nebulization 3 (three) times daily. May also use via nebulizer every 6 hours as needed for shortness of breath   LEVOTHYROXINE (SYNTHROID, LEVOTHROID) 25 MCG TABLET    Take 25 mcg by mouth daily before breakfast.   NITROGLYCERIN (NITROSTAT) 0.4 MG SL TABLET    Place 0.4 mg under the tongue every 5 (five) minutes as needed for chest pain.   NUTRITIONAL SUPPLEMENTS (ENSURE ENLIVE PO)    Take 237 mLs by mouth 3 (three) times daily between meals.   OXYGEN    Inhale into  the lungs. 2lpm via [place] prn for shortness of breath or O2 sat <90%   PANTOPRAZOLE (PROTONIX) 40 MG TABLET    Take 1 tablet (40 mg total) by mouth daily.   SACCHAROMYCES BOULARDII (FLORASTOR) 250 MG CAPSULE    Take 250 mg by mouth 2 (two) times daily.   SACUBITRIL-VALSARTAN (ENTRESTO) 97-103 MG    Take 1 tablet by mouth 2 (two) times daily.   SENNA (SENOKOT) 8.6 MG TABS TABLET    Take 1 tablet (8.6 mg total) by mouth daily as needed for mild constipation.   SERTRALINE (ZOLOFT) 25 MG TABLET    Take 25 mg by mouth daily.   SPIRONOLACTONE (ALDACTONE) 25 MG TABLET    Take 1 tablet (25 mg total) by mouth daily.   TORSEMIDE (DEMADEX) 20 MG TABLET    Take 3 tablets (60 mg total) by mouth daily.  Modified Medications   No medications on file  Discontinued Medications   AMINO ACIDS-PROTEIN HYDROLYS (FEEDING SUPPLEMENT, PRO-STAT SUGAR FREE 64,) LIQD    Take 30 mLs by mouth 2 (two) times daily.   FEEDING SUPPLEMENT, ENSURE ENLIVE, (ENSURE ENLIVE) LIQD    Take 237 mLs by mouth 2 (two) times daily between meals.   SACUBITRIL-VALSARTAN (ENTRESTO) 49-51 MG    Take 1 tablet by mouth 2 (two) times daily.   VALACYCLOVIR (VALTREX) 1000 MG TABLET    Take 1,000 mg by mouth 2 (two) times daily.     Allergies  Allergen Reactions  . Hydralazine Other (See Comments)    4/21-4/26/18 pleuritic chest pain with bilateral effusions and pericardial rub. Rule out hydralazine drug-induced lupus   . Percocet [Oxycodone-Acetaminophen] Other (See Comments)    Needing narcan on multiple occasions due to oversedation after getting percocet   . Tramadol Other (See Comments)    Excess sedation; ? Related to pre-existing hepatic dysfunction with PMH Hep C     REVIEW OF SYSTEMS:  GENERAL: no change in appetite, no fatigue, no weight changes, no fever, chills or weakness EYES: Denies change in vision, dry eyes, eye pain, itching or discharge EARS: Denies change in hearing, ringing in ears, or earache NOSE: Denies nasal  congestion or epistaxis MOUTH and THROAT: Denies oral discomfort, gingival pain or bleeding, pain from teeth or hoarseness   RESPIRATORY: no cough, SOB, DOE, wheezing, hemoptysis CARDIAC: no chest pain, edema or palpitations GI: no abdominal pain, diarrhea, constipation, heart burn, nausea or vomiting GU: Denies dysuria, frequency, hematuria, incontinence, or discharge PSYCHIATRIC: Denies feeling of depression or anxiety. No report of hallucinations, insomnia, paranoia,  or agitation     PHYSICAL EXAMINATION  GENERAL APPEARANCE: Well nourished. In no acute distress. 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 No evidence of trauma EYES: Lids open and close normally. No blepharitis, entropion or ectropion. PERRL. Conjunctivae are clear and sclerae are white. Lenses are without opacity EARS: Pinnae are normal. Patient hears normal voice tunes of the examiner MOUTH and THROAT: Lips are without lesions. Oral mucosa is moist and without lesions. 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 Affect and behavior are appropriate  LABS/RADIOLOGY: Labs reviewed: Basic Metabolic Panel:  Recent Labs  45/40/98 0411 08/28/16 0500 08/29/16 0455  09/04/16 0237 09/05/16 0500 09/06/16 0500 09/18/16  NA 128* 129* 131*  < > 132* 132* 133* 136*  K 3.8 4.0 4.0  < > 4.3 4.0 3.8 4.5  CL 97* 97* 96*  < > 96* 92* 91*  --   CO2 23 23 25   < > 29 31 33*  --   GLUCOSE  80 144* 121*  < > 144* 201* 170*  --   BUN 40* 41* 42*  < > 34* 34* 32* 52*  CREATININE 1.40* 1.51* 1.53*  < > 1.18 1.22 1.21 1.0  CALCIUM 8.0* 8.2* 8.6*  < > 8.8* 9.1 9.0  --   MG 1.9 2.1 2.0  --   --   --   --   --   PHOS 3.3 3.8 3.5  --   --   --   --   --   < > = values in this interval not displayed. Liver Function Tests:  Recent Labs  08/02/16 1610 08/07/16 0541 08/15/16 08/27/16 0000 08/29/16 1144  AST 19 45* 55* 28  --   ALT 8* 15* 36 16*  --   ALKPHOS 141* 146* 265* 141*  --   BILITOT 1.6* 0.7  --  1.0  --   PROT 7.8 7.8  --  7.1 6.8  ALBUMIN 2.4* 2.4*  --  2.4*  --     Recent Labs  04/02/16 1121 05/18/16 1111  AMMONIA 53* 31   CBC:  Recent Labs  04/02/16 1046  05/15/16 1440  08/27/16 0000  09/04/16 0237 09/05/16 0500 09/06/16 0500 09/18/16  WBC 12.9*  < > 6.6  < > 9.6  < > 6.7 8.1 9.4 4.4  NEUTROABS 9.7*  --  4.0  --  8.2*  --   --   --   --   --   HGB 9.1*  < > 8.2*  < > 9.7*  < > 9.9* 10.8* 12.3* 12.1*  HCT 28.9*  < > 26.6*  < > 31.3*  < > 32.4* 34.6* 38.6* 40*  MCV 93.8  < > 86.9  < > 85.3  < > 85.0 84.6 85.2  --   PLT 185  < > 230  < > 268  < > 256 274 269 189  < > = values in this interval not displayed. Lipid Panel:  Recent Labs  06/09/16 06/11/16  HDL 38 42   Cardiac Enzymes:  Recent Labs  08/27/16 0016 08/27/16 0411 08/27/16 1120  TROPONINI 0.03* 0.07* 0.06*   CBG:  Recent Labs  09/05/16 2100 09/06/16 0614 09/06/16 1145  GLUCAP 158* 169* 176*    Dg Chest Port 1 View  Result Date: 09/02/2016 CLINICAL DATA:  CHF EXAM: PORTABLE CHEST 1 VIEW COMPARISON:  08/31/2016 FINDINGS: Cardiac shadow is stable. Postsurgical changes are again seen. Right jugular central line is again noted and stable. Some improved aeration is noted in the bases bilaterally although persistent bilateral pleural effusions are seen. No pneumothorax is noted. No bony abnormality is seen. IMPRESSION: Improving aeration in the bases although persistent effusions  are again seen. Electronically Signed   By: Alcide Clever M.D.   On: 09/02/2016 07:36    ASSESSMENT/PLAN:  1. Chronic combined systolic and diastolic heart failure (HCC) - EF 35-40% , with O2 @ 2L/min via [place] continuously, continue Entresto 97-103 milligrams 1 tab by mouth twice a day, torsemide 20 mg 3 tabs = 3 mg by mouth daily, Spironolactone 25 mg 1 tab by mouth daily, digoxin 125 g 1 tab by mouth daily and carvedilol 6.25 mg 1 tab by mouth twice a day; will check digoxin level and BMP   2. Coronary artery disease involving coronary bypass graft of native heart without angina pectoris - no complaints of chest pain, continue aspirin EC 81 mg 1 tab by mouth daily, atorvastatin 20 mg 1 tab by mouth every at bedtime and NTG when necessary   3. Slow transit constipation - continue senna 8.6 mg 1 tab by mouth daily when necessary   4. Hypothyroidism, unspecified type - continue levothyroxine 25 g 1 tab by mouth daily Lab Results  Component Value Date   TSH 2.74 07/18/2016    5. Chronic anemia - Continue ferrous sulfate 325 mg 1 tab by mouth daily Lab Results  Component Value Date   HGB 12.1 (A) 09/18/2016     6. Chronic gout without tophus, unspecified cause, unspecified site - continue Colchicine 0.6 mg 1 tab by mouth daily   7. Hyperlipidemia, unspecified hyperlipidemia type - continue atorvastatin 20 mg 1 tab by mouth daily at bedtime Lab Results  Component Value Date   CHOL 88 06/11/2016   HDL 42 06/11/2016   LDLCALC 30 06/11/2016   TRIG 84 06/11/2016     8. Chronic depression - mood this is stable; continue sertraline 25 mg 1 tab by mouth daily   9. Diabetes mellitus with peripheral vascular disease (HCC) - continue Lantus Solostar 100 units/mL inject 8 units subcutaneous daily at bedtime Lab Results  Component Value Date   HGBA1C 6.8 07/03/2016     10. History of GI bleed -  No signs of GI bleed, continue pantoprazole 40 mg 1 tab by mouth daily   11. Chronic  respiratory failure with hypoxia (HCC) - continue O2 at 2 L/minute via [place] continuously  12. PAF - rate-controlled, not on anticoagulation due to hx of GI bleed, Continue digoxin 125 g 1 tab by mouth daily and carvedilol 6.25 mg 1 tab by mouth twice a day     Goals of care:  Long-term care   Monina C. Medina-Vargas - NP    [facility] [employer] [phone]

## 2016-10-02 ENCOUNTER — Encounter: Payer: Self-pay | Admitting: Internal Medicine

## 2016-10-10 ENCOUNTER — Other Ambulatory Visit (HOSPITAL_COMMUNITY): Payer: Self-pay | Admitting: Internal Medicine

## 2016-10-16 ENCOUNTER — Non-Acute Institutional Stay (SKILLED_NURSING_FACILITY): Payer: Medicare Other | Admitting: Adult Health

## 2016-10-16 ENCOUNTER — Telehealth: Payer: Self-pay | Admitting: *Deleted

## 2016-10-16 ENCOUNTER — Encounter: Payer: Self-pay | Admitting: Adult Health

## 2016-10-16 DIAGNOSIS — S91302S Unspecified open wound, left foot, sequela: Secondary | ICD-10-CM | POA: Diagnosis not present

## 2016-10-16 DIAGNOSIS — E1151 Type 2 diabetes mellitus with diabetic peripheral angiopathy without gangrene: Secondary | ICD-10-CM | POA: Diagnosis not present

## 2016-10-16 DIAGNOSIS — M79672 Pain in left foot: Secondary | ICD-10-CM | POA: Diagnosis not present

## 2016-10-16 NOTE — Progress Notes (Addendum)
DATE:  10/16/2016   MRN:  [id number]  BIRTHDAY: 20-Jun-1945  Facility:  Nursing Home Location:  [facility] Room Number: 113-A  LEVEL OF CARE:  SNF (31)  Contact Information    Name Relation Home Work Mobile   Glover,[name] Niece [phone]  [phone]   Mohr,Clementine Mother [phone]     Tc, [place] Daughter   [phone]       Code Status History    Date Active Date Inactive Code Status Order ID Comments User Context   08/27/2016  2:20 AM 09/06/2016  5:35 PM Full Code [id number]  Kathlene Cote, PA-C ED   08/02/2016  8:29 PM 08/07/2016  6:48 PM Full Code [id number]  Michael Litter, MD ED   05/15/2016  5:59 PM 05/21/2016  9:41 PM Full Code [id number]  Penny Pia, MD Inpatient   04/02/2016  3:19 PM 04/08/2016  8:26 PM Full Code [id number]  Hyacinth Meeker, MD ED   12/27/2015  1:27 AM 01/14/2016  5:17 PM Full Code [id number]  Danford, Earl Lites, MD Inpatient       Chief Complaint  Patient presents with  . Acute Visit    Left foot pain    HISTORY OF PRESENT ILLNESS:  This is a 63-YO male seen for an acute visit secondary to left foot pain. He was seen in his room and verbalized severe pain on left foot. He had removed dressing on  left foot. Left foot open wound beside 3rd toe has no erythema, no drainage. He had open amputation on 04/04/16 and being followed-up by vascular.  He is currently on Acetaminophen 650 mg Q 6 hours PRN for pain. He has PMH of chronic anemia, chronic combined systolic and diastolic heart failure, cirrhosis, HCV, CKD stage III, CVA, diabetes mellitus and paroxysmal atrial fibrillation.   PAST MEDICAL HISTORY:  Past Medical History:  Diagnosis Date  . CAD (coronary artery disease)    a. remote CABG.  . Chronic anemia   . Chronic combined systolic and diastolic CHF (congestive heart failure) (HCC)   . Cirrhosis (HCC)   . CKD (chronic kidney disease), stage III   . CVA (cerebral infarction)   . DM (diabetes mellitus),  type 2 with peripheral vascular complications (HCC)   . History of hepatitis C 1990s   Hep B immune  . Hypoalbuminemia   . IDDM (insulin dependent diabetes mellitus) (HCC)   . Mitral regurgitation    a. s/p prior ring annuloplasty prosthesis per echo with mod MR by echo 07/2016.  Marland Kitchen PAF (paroxysmal atrial fibrillation) (HCC)    a. not on anticoagulation likely due to h/o IC hemorrhage, prior GIB, cirrhosis. b. suspected prior LAA clipping based on imaging.  . Peripheral neuropathy   . Peripheral vascular disease (HCC)   . Proteinuria      CURRENT MEDICATIONS: Reviewed  Patient's Medications  New Prescriptions   No medications on file  Previous Medications   ACETAMINOPHEN (TYLENOL) 325 MG TABLET    Take 650 mg by mouth every 6 (six) hours as needed.   ASPIRIN EC 81 MG EC TABLET    Take 1 tablet (81 mg total) by mouth daily.   ATORVASTATIN (LIPITOR) 20 MG TABLET    Take 20 mg by mouth daily.   BARRIER CREAM (NON-SPECIFIED) CREA    Apply 1 application topically 2 (two) times daily.   CARVEDILOL (COREG) 6.25 MG TABLET    Take 6.25 mg by mouth 2 (two) times daily with a meal.  COLCHICINE 0.6 MG TABLET    Take 0.6 mg by mouth daily.   DIGOXIN (LANOXIN) 0.125 MG TABLET    Take 1 tablet (0.125 mg total) by mouth daily.   FERROUS SULFATE 325 (65 FE) MG TABLET    Take 1 tablet (325 mg total) by mouth daily with breakfast.   INSULIN GLARGINE (BASAGLAR KWIKPEN) 100 UNIT/ML SOPN    Inject 8 Units into the skin at bedtime.   IPRATROPIUM-ALBUTEROL (DUONEB) 0.5-2.5 (3) MG/3ML SOLN    Take 3 mLs by nebulization 3 (three) times daily. May also use via nebulizer every 6 hours as needed for shortness of breath   LEVOTHYROXINE (SYNTHROID, LEVOTHROID) 25 MCG TABLET    Take 25 mcg by mouth daily before breakfast.   NITROGLYCERIN (NITROSTAT) 0.4 MG SL TABLET    Place 0.4 mg under the tongue every 5 (five) minutes as needed for chest pain.   NUTRITIONAL SUPPLEMENTS (ENSURE ENLIVE PO)    Take 237 mLs by mouth 3  (three) times daily between meals.   OXYGEN    Inhale into the lungs. 2lpm via [place] prn for shortness of breath or O2 sat <90%   PANTOPRAZOLE (PROTONIX) 40 MG TABLET    Take 1 tablet (40 mg total) by mouth daily.   SACCHAROMYCES BOULARDII (FLORASTOR) 250 MG CAPSULE    Take 250 mg by mouth 2 (two) times daily.   SACUBITRIL-VALSARTAN (ENTRESTO) 97-103 MG    Take 1 tablet by mouth 2 (two) times daily.   SENNA (SENOKOT) 8.6 MG TABS TABLET    Take 1 tablet (8.6 mg total) by mouth daily as needed for mild constipation.   SERTRALINE (ZOLOFT) 25 MG TABLET    Take 25 mg by mouth daily.   SPIRONOLACTONE (ALDACTONE) 25 MG TABLET    Take 1 tablet (25 mg total) by mouth daily.   TORSEMIDE (DEMADEX) 20 MG TABLET    Take 3 tablets (60 mg total) by mouth daily.  Modified Medications   No medications on file  Discontinued Medications   COLCHICINE 0.6 MG TABLET    Take 1 tablet (0.6 mg total) by mouth daily.     Allergies  Allergen Reactions  . Hydralazine Other (See Comments)    4/21-4/26/18 pleuritic chest pain with bilateral effusions and pericardial rub. Rule out hydralazine drug-induced lupus   . Percocet [Oxycodone-Acetaminophen] Other (See Comments)    Needing narcan on multiple occasions due to oversedation after getting percocet   . Tramadol Other (See Comments)    Excess sedation; ? Related to pre-existing hepatic dysfunction with PMH Hep C     REVIEW OF SYSTEMS:  GENERAL:  no fever or chills  EYES: Denies change in vision, dry eyes, eye pain, itching or discharge EARS: Denies change in hearing, ringing in ears, or earache NOSE: Denies nasal congestion or epistaxis MOUTH and THROAT: Denies oral discomfort, gingival pain or bleeding, pain from teeth or hoarseness   RESPIRATORY: no cough, SOB, DOE, wheezing, hemoptysis CARDIAC: no chest pain, edema or palpitations GI: no abdominal pain, diarrhea, constipation, heart burn, nausea or vomiting GU: Denies dysuria, frequency, hematuria,  incontinence, or discharge PSYCHIATRIC: Denies feeling of depression or anxiety. No report of hallucinations, insomnia, paranoia, or agitation    PHYSICAL EXAMINATION  GENERAL APPEARANCE: Well nourished. In no acute distress. 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 No evidence of trauma EYES: Lids open and close normally.  No blepharitis, entropion or ectropion. PERRL. Conjunctivae are clear and sclerae are white. Lenses are without opacity EARS: Pinnae are normal. Patient hears normal voice tunes of the examiner MOUTH and THROAT: Lips are without lesions. Oral mucosa is moist and without lesions. Tongue is normal in shape, size, and color and without lesions NECK: supple, trachea midline, no neck masses, no thyroid tenderness, no thyromegaly LYMPHATICS: no LAN in the neck, no supraclavicular LAN RESPIRATORY: breathing is even & unlabored, BS CTAB CARDIAC: RRR, no murmur,no extra heart sounds, no edema GI: abdomen soft, normal BS, no masses, no tenderness, no hepatomegaly, no splenomegaly EXTREMITIES:  old right AKA, able to move X 4 extremities PSYCHIATRIC: Alert to self and place, disoriented to time. Affect and behavior are appropriate   LABS/RADIOLOGY: Labs reviewed: Basic Metabolic Panel:  Recent Labs  91/47/82 0411 08/28/16 0500 08/29/16 0455  09/04/16 0237 09/05/16 0500 09/06/16 0500 09/18/16 09/30/16  NA 128* 129* 131*  < > 132* 132* 133* 136* 132*  K 3.8 4.0 4.0  < > 4.3 4.0 3.8 4.5 5.1  CL 97* 97* 96*  < > 96* 92* 91*  --   --   CO2 23 23 25   < > 29 31 33*  --   --   GLUCOSE 80 144* 121*  < > 144* 201* 170*  --   --   BUN 40* 41* 42*  < > 34* 34* 32* 52* 52*  CREATININE 1.40* 1.51* 1.53*  < > 1.18 1.22 1.21 1.0 1.4*  CALCIUM 8.0* 8.2* 8.6*  < > 8.8* 9.1 9.0  --   --   MG 1.9 2.1 2.0  --   --   --   --   --   --   PHOS 3.3 3.8 3.5  --   --   --   --   --   --     < > = values in this interval not displayed. Liver Function Tests:  Recent Labs  08/02/16 1610 08/07/16 0541 08/15/16 08/27/16 0000 08/29/16 1144  AST 19 45* 55* 28  --   ALT 8* 15* 36 16*  --   ALKPHOS 141* 146* 265* 141*  --   BILITOT 1.6* 0.7  --  1.0  --   PROT 7.8 7.8  --  7.1 6.8  ALBUMIN 2.4* 2.4*  --  2.4*  --     Recent Labs  04/02/16 1121 05/18/16 1111  AMMONIA 53* 31   CBC:  Recent Labs  04/02/16 1046  05/15/16 1440  08/27/16 0000  09/04/16 0237 09/05/16 0500 09/06/16 0500 09/18/16  WBC 12.9*  < > 6.6  < > 9.6  < > 6.7 8.1 9.4 4.4  NEUTROABS 9.7*  --  4.0  --  8.2*  --   --   --   --   --   HGB 9.1*  < > 8.2*  < > 9.7*  < > 9.9* 10.8* 12.3* 12.1*  HCT 28.9*  < > 26.6*  < > 31.3*  < > 32.4* 34.6* 38.6* 40*  MCV 93.8  < > 86.9  < > 85.3  < > 85.0 84.6 85.2  --   PLT 185  < > 230  < > 268  < > 256 274 269 189  < > = values in this interval not displayed. 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 Medina-Vargas - NP   [facility] [phone]

## 2016-10-16 NOTE — Telephone Encounter (Signed)
[name] from [facility] called stating that patient was experiencing worsening pain and wound condition. His follow up appointment is in August.  An appointment was scheduled with the NP tomorrow, July 6,2018 @ 2:15.  I called [facility] and notified Stanton KidneyDebra.

## 2016-10-17 ENCOUNTER — Inpatient Hospital Stay (HOSPITAL_COMMUNITY)
Admission: EM | Admit: 2016-10-17 | Discharge: 2016-10-23 | DRG: 853 | Disposition: A | Discharge status: Skilled Nursing Facility | Payer: Medicare Other | Attending: Internal Medicine | Admitting: Internal Medicine

## 2016-10-17 ENCOUNTER — Ambulatory Visit (INDEPENDENT_AMBULATORY_CARE_PROVIDER_SITE_OTHER): Payer: Medicare Other | Admitting: Family

## 2016-10-17 ENCOUNTER — Emergency Department (HOSPITAL_COMMUNITY): Payer: Medicare Other

## 2016-10-17 ENCOUNTER — Encounter: Payer: Self-pay | Admitting: Family

## 2016-10-17 ENCOUNTER — Encounter (HOSPITAL_COMMUNITY): Payer: Self-pay | Admitting: Emergency Medicine

## 2016-10-17 VITALS — BP 82/45 | HR 82 | Temp 97.4°F | Resp 16 | Ht 71.0 in | Wt 147.0 lb

## 2016-10-17 DIAGNOSIS — I2510 Atherosclerotic heart disease of native coronary artery without angina pectoris: Secondary | ICD-10-CM | POA: Diagnosis present

## 2016-10-17 DIAGNOSIS — IMO0002 Reserved for concepts with insufficient information to code with codable children: Secondary | ICD-10-CM | POA: Diagnosis present

## 2016-10-17 DIAGNOSIS — E861 Hypovolemia: Secondary | ICD-10-CM | POA: Diagnosis present

## 2016-10-17 DIAGNOSIS — Z87891 Personal history of nicotine dependence: Secondary | ICD-10-CM | POA: Diagnosis not present

## 2016-10-17 DIAGNOSIS — I959 Hypotension, unspecified: Secondary | ICD-10-CM | POA: Diagnosis present

## 2016-10-17 DIAGNOSIS — E1151 Type 2 diabetes mellitus with diabetic peripheral angiopathy without gangrene: Secondary | ICD-10-CM

## 2016-10-17 DIAGNOSIS — I130 Hypertensive heart and chronic kidney disease with heart failure and stage 1 through stage 4 chronic kidney disease, or unspecified chronic kidney disease: Secondary | ICD-10-CM | POA: Diagnosis present

## 2016-10-17 DIAGNOSIS — E1152 Type 2 diabetes mellitus with diabetic peripheral angiopathy with gangrene: Secondary | ICD-10-CM | POA: Diagnosis present

## 2016-10-17 DIAGNOSIS — Z888 Allergy status to other drugs, medicaments and biological substances status: Secondary | ICD-10-CM

## 2016-10-17 DIAGNOSIS — Z8619 Personal history of other infectious and parasitic diseases: Secondary | ICD-10-CM | POA: Diagnosis not present

## 2016-10-17 DIAGNOSIS — Z89521 Acquired absence of right knee: Secondary | ICD-10-CM | POA: Diagnosis not present

## 2016-10-17 DIAGNOSIS — I480 Paroxysmal atrial fibrillation: Secondary | ICD-10-CM | POA: Diagnosis present

## 2016-10-17 DIAGNOSIS — T8789 Other complications of amputation stump: Secondary | ICD-10-CM | POA: Diagnosis not present

## 2016-10-17 DIAGNOSIS — E875 Hyperkalemia: Secondary | ICD-10-CM | POA: Diagnosis present

## 2016-10-17 DIAGNOSIS — E1165 Type 2 diabetes mellitus with hyperglycemia: Secondary | ICD-10-CM | POA: Diagnosis present

## 2016-10-17 DIAGNOSIS — N183 Chronic kidney disease, stage 3 (moderate): Secondary | ICD-10-CM | POA: Diagnosis present

## 2016-10-17 DIAGNOSIS — I5022 Chronic systolic (congestive) heart failure: Secondary | ICD-10-CM | POA: Diagnosis not present

## 2016-10-17 DIAGNOSIS — N179 Acute kidney failure, unspecified: Secondary | ICD-10-CM | POA: Diagnosis present

## 2016-10-17 DIAGNOSIS — Z89611 Acquired absence of right leg above knee: Secondary | ICD-10-CM | POA: Diagnosis not present

## 2016-10-17 DIAGNOSIS — E871 Hypo-osmolality and hyponatremia: Secondary | ICD-10-CM | POA: Diagnosis present

## 2016-10-17 DIAGNOSIS — N390 Urinary tract infection, site not specified: Secondary | ICD-10-CM | POA: Diagnosis present

## 2016-10-17 DIAGNOSIS — Z7982 Long term (current) use of aspirin: Secondary | ICD-10-CM

## 2016-10-17 DIAGNOSIS — Z9981 Dependence on supplemental oxygen: Secondary | ICD-10-CM

## 2016-10-17 DIAGNOSIS — E43 Unspecified severe protein-calorie malnutrition: Secondary | ICD-10-CM | POA: Diagnosis present

## 2016-10-17 DIAGNOSIS — Z681 Body mass index (BMI) 19 or less, adult: Secondary | ICD-10-CM | POA: Diagnosis not present

## 2016-10-17 DIAGNOSIS — Z794 Long term (current) use of insulin: Secondary | ICD-10-CM

## 2016-10-17 DIAGNOSIS — I96 Gangrene, not elsewhere classified: Secondary | ICD-10-CM

## 2016-10-17 DIAGNOSIS — A419 Sepsis, unspecified organism: Principal | ICD-10-CM | POA: Diagnosis present

## 2016-10-17 DIAGNOSIS — Z79899 Other long term (current) drug therapy: Secondary | ICD-10-CM

## 2016-10-17 DIAGNOSIS — B182 Chronic viral hepatitis C: Secondary | ICD-10-CM | POA: Diagnosis not present

## 2016-10-17 DIAGNOSIS — I10 Essential (primary) hypertension: Secondary | ICD-10-CM | POA: Diagnosis present

## 2016-10-17 DIAGNOSIS — E118 Type 2 diabetes mellitus with unspecified complications: Secondary | ICD-10-CM

## 2016-10-17 DIAGNOSIS — Z951 Presence of aortocoronary bypass graft: Secondary | ICD-10-CM

## 2016-10-17 DIAGNOSIS — E1122 Type 2 diabetes mellitus with diabetic chronic kidney disease: Secondary | ICD-10-CM | POA: Diagnosis present

## 2016-10-17 DIAGNOSIS — E039 Hypothyroidism, unspecified: Secondary | ICD-10-CM | POA: Diagnosis present

## 2016-10-17 DIAGNOSIS — I5042 Chronic combined systolic (congestive) and diastolic (congestive) heart failure: Secondary | ICD-10-CM | POA: Diagnosis present

## 2016-10-17 DIAGNOSIS — B1920 Unspecified viral hepatitis C without hepatic coma: Secondary | ICD-10-CM | POA: Diagnosis present

## 2016-10-17 DIAGNOSIS — K7460 Unspecified cirrhosis of liver: Secondary | ICD-10-CM | POA: Diagnosis present

## 2016-10-17 DIAGNOSIS — Z8673 Personal history of transient ischemic attack (TIA), and cerebral infarction without residual deficits: Secondary | ICD-10-CM

## 2016-10-17 DIAGNOSIS — Z8249 Family history of ischemic heart disease and other diseases of the circulatory system: Secondary | ICD-10-CM

## 2016-10-17 DIAGNOSIS — E876 Hypokalemia: Secondary | ICD-10-CM | POA: Diagnosis present

## 2016-10-17 DIAGNOSIS — G8918 Other acute postprocedural pain: Secondary | ICD-10-CM | POA: Diagnosis not present

## 2016-10-17 DIAGNOSIS — Z885 Allergy status to narcotic agent status: Secondary | ICD-10-CM

## 2016-10-17 DIAGNOSIS — M064 Inflammatory polyarthropathy: Secondary | ICD-10-CM | POA: Diagnosis present

## 2016-10-17 DIAGNOSIS — T8189XA Other complications of procedures, not elsewhere classified, initial encounter: Secondary | ICD-10-CM

## 2016-10-17 LAB — BASIC METABOLIC PANEL
Anion gap: 11 (ref 5–15)
BUN: 91 mg/dL — AB (ref 6–20)
CALCIUM: 9.9 mg/dL (ref 8.9–10.3)
CHLORIDE: 97 mmol/L — AB (ref 101–111)
CO2: 19 mmol/L — AB (ref 22–32)
CREATININE: 3.53 mg/dL — AB (ref 0.61–1.24)
GFR calc Af Amer: 19 mL/min — ABNORMAL LOW (ref >60)
GFR calc non Af Amer: 16 mL/min — ABNORMAL LOW (ref >60)
Glucose, Bld: 147 mg/dL — ABNORMAL HIGH (ref 65–99)
Potassium: >7.5 mmol/L (ref 3.5–5.1)
SODIUM: 127 mmol/L — AB (ref 135–145)

## 2016-10-17 LAB — CBC
HCT: 48.2 % (ref 39.0–52.0)
Hemoglobin: 15.7 g/dL (ref 13.0–17.0)
MCH: 27.9 pg (ref 26.0–34.0)
MCHC: 32.6 g/dL (ref 30.0–36.0)
MCV: 85.8 fL (ref 78.0–100.0)
Platelets: 245 10*3/uL (ref 150–400)
RBC: 5.62 MIL/uL (ref 4.22–5.81)
RDW: 18.3 % — AB (ref 11.5–15.5)
WBC: 11.1 10*3/uL — ABNORMAL HIGH (ref 4.0–10.5)

## 2016-10-17 LAB — I-STAT CG4 LACTIC ACID, ED: Lactic Acid, Venous: 2.68 mmol/L (ref 0.5–1.9)

## 2016-10-17 LAB — CBG MONITORING, ED: Glucose-Capillary: 156 mg/dL — ABNORMAL HIGH (ref 65–99)

## 2016-10-17 LAB — PROCALCITONIN: PROCALCITONIN: 0.22 ng/mL

## 2016-10-17 LAB — PROTIME-INR
INR: 1.04
Prothrombin Time: 13.6 seconds (ref 11.4–15.2)

## 2016-10-17 LAB — POTASSIUM: Potassium: 5.7 mmol/L — ABNORMAL HIGH (ref 3.5–5.1)

## 2016-10-17 LAB — GLUCOSE, CAPILLARY: GLUCOSE-CAPILLARY: 99 mg/dL (ref 65–99)

## 2016-10-17 LAB — DIGOXIN LEVEL: Digoxin Level: 1.5 ng/mL (ref 0.8–2.0)

## 2016-10-17 LAB — LACTIC ACID, PLASMA: LACTIC ACID, VENOUS: 3 mmol/L — AB (ref 0.5–1.9)

## 2016-10-17 MED ORDER — INSULIN ASPART 100 UNIT/ML IV SOLN: 10.0000 [IU] | Freq: Once | INTRAVENOUS | Status: DC

## 2016-10-17 MED ORDER — PIPERACILLIN-TAZOBACTAM 3.375 G IVPB 30 MIN
3.3750 g | Freq: Once | INTRAVENOUS | Status: AC
  Administered 2016-10-17: 3.375 g via INTRAVENOUS
  Filled 2016-10-17: qty 50

## 2016-10-17 MED ORDER — VANCOMYCIN HCL IN DEXTROSE 1-5 GM/200ML-% IV SOLN
1000.0000 mg | Freq: Once | INTRAVENOUS | Status: AC
  Administered 2016-10-17: 1000 mg via INTRAVENOUS
  Filled 2016-10-17: qty 200

## 2016-10-17 MED ORDER — PIPERACILLIN-TAZOBACTAM IN DEX 2-0.25 GM/50ML IV SOLN
2.2500 g | Freq: Three times a day (TID) | INTRAVENOUS | Status: DC
  Administered 2016-10-18 – 2016-10-19 (×4): 2.25 g via INTRAVENOUS
  Filled 2016-10-17 (×6): qty 50

## 2016-10-17 MED ORDER — PANTOPRAZOLE SODIUM 40 MG PO TBEC
40.0000 mg | DELAYED_RELEASE_TABLET | Freq: Every day | ORAL | Status: DC
  Administered 2016-10-18 – 2016-10-23 (×6): 40 mg via ORAL
  Filled 2016-10-17 (×6): qty 1

## 2016-10-17 MED ORDER — BISACODYL 5 MG PO TBEC: 5.0000 mg | DELAYED_RELEASE_TABLET | Freq: Every day | ORAL | Status: DC | PRN

## 2016-10-17 MED ORDER — HYDROMORPHONE HCL 1 MG/ML IJ SOLN
1.0000 mg | Freq: Once | INTRAMUSCULAR | Status: AC
Start: 2016-10-17 — End: 2016-10-17
  Administered 2016-10-17: 1 mg via INTRAVENOUS
  Filled 2016-10-17: qty 1

## 2016-10-17 MED ORDER — IPRATROPIUM-ALBUTEROL 0.5-2.5 (3) MG/3ML IN SOLN
3.0000 mL | Freq: Three times a day (TID) | RESPIRATORY_TRACT | Status: DC
  Filled 2016-10-17: qty 3

## 2016-10-17 MED ORDER — ALBUTEROL SULFATE (2.5 MG/3ML) 0.083% IN NEBU
10.0000 mg | INHALATION_SOLUTION | Freq: Once | RESPIRATORY_TRACT | Status: AC
  Administered 2016-10-17: 10 mg via RESPIRATORY_TRACT
  Filled 2016-10-17: qty 12

## 2016-10-17 MED ORDER — VANCOMYCIN HCL IN DEXTROSE 750-5 MG/150ML-% IV SOLN
750.0000 mg | INTRAVENOUS | Status: DC
  Filled 2016-10-17: qty 150

## 2016-10-17 MED ORDER — SODIUM CHLORIDE 0.9 % IV SOLN
Freq: Once | INTRAVENOUS | Status: DC
  Administered 2016-10-17: 20:00:00 via INTRAVENOUS

## 2016-10-17 MED ORDER — SODIUM CHLORIDE 0.9 % IV BOLUS (SEPSIS)
500.0000 mL | Freq: Once | INTRAVENOUS | Status: AC
  Administered 2016-10-17: 500 mL via INTRAVENOUS

## 2016-10-17 MED ORDER — INSULIN ASPART 100 UNIT/ML ~~LOC~~ SOLN
0.0000 [IU] | Freq: Every day | SUBCUTANEOUS | Status: DC
Start: 2016-10-17 — End: 2016-10-23
  Administered 2016-10-20: 2 [IU] via SUBCUTANEOUS

## 2016-10-17 MED ORDER — DIPHENHYDRAMINE HCL 25 MG PO CAPS
25.0000 mg | ORAL_CAPSULE | Freq: Four times a day (QID) | ORAL | Status: DC | PRN
  Administered 2016-10-17: 25 mg via ORAL
  Filled 2016-10-17: qty 1

## 2016-10-17 MED ORDER — SACCHAROMYCES BOULARDII 250 MG PO CAPS
250.0000 mg | ORAL_CAPSULE | Freq: Two times a day (BID) | ORAL | Status: DC
  Administered 2016-10-18 – 2016-10-23 (×10): 250 mg via ORAL
  Filled 2016-10-17 (×10): qty 1

## 2016-10-17 MED ORDER — ACETAMINOPHEN 650 MG RE SUPP: 650.0000 mg | Freq: Four times a day (QID) | RECTAL | Status: DC | PRN

## 2016-10-17 MED ORDER — LEVOTHYROXINE SODIUM 25 MCG PO TABS
25.0000 ug | ORAL_TABLET | Freq: Every day | ORAL | Status: DC
  Administered 2016-10-18 – 2016-10-23 (×5): 25 ug via ORAL
  Filled 2016-10-17 (×6): qty 1

## 2016-10-17 MED ORDER — DIGOXIN 125 MCG PO TABS
0.1250 mg | ORAL_TABLET | Freq: Every day | ORAL | Status: DC
  Administered 2016-10-18 – 2016-10-23 (×6): 0.125 mg via ORAL
  Filled 2016-10-17 (×6): qty 1

## 2016-10-17 MED ORDER — HEPARIN SODIUM (PORCINE) 5000 UNIT/ML IJ SOLN
5000.0000 [IU] | Freq: Three times a day (TID) | INTRAMUSCULAR | Status: DC
  Administered 2016-10-17 – 2016-10-23 (×14): 5000 [IU] via SUBCUTANEOUS
  Filled 2016-10-17 (×16): qty 1

## 2016-10-17 MED ORDER — POLYETHYLENE GLYCOL 3350 17 G PO PACK: 17.0000 g | PACK | Freq: Every day | ORAL | Status: DC | PRN

## 2016-10-17 MED ORDER — ONDANSETRON HCL 4 MG PO TABS: 4.0000 mg | ORAL_TABLET | Freq: Four times a day (QID) | ORAL | Status: DC | PRN

## 2016-10-17 MED ORDER — ONDANSETRON HCL 4 MG/2ML IJ SOLN: 4.0000 mg | Freq: Four times a day (QID) | INTRAMUSCULAR | Status: DC | PRN

## 2016-10-17 MED ORDER — SERTRALINE HCL 25 MG PO TABS
25.0000 mg | ORAL_TABLET | Freq: Every day | ORAL | Status: DC
  Administered 2016-10-18 – 2016-10-23 (×6): 25 mg via ORAL
  Filled 2016-10-17 (×6): qty 1

## 2016-10-17 MED ORDER — INSULIN ASPART 100 UNIT/ML ~~LOC~~ SOLN
0.0000 [IU] | Freq: Three times a day (TID) | SUBCUTANEOUS | Status: DC
  Administered 2016-10-18 – 2016-10-21 (×7): 1 [IU] via SUBCUTANEOUS
  Administered 2016-10-22: 2 [IU] via SUBCUTANEOUS
  Administered 2016-10-22: 1 [IU] via SUBCUTANEOUS
  Administered 2016-10-22: 2 [IU] via SUBCUTANEOUS
  Administered 2016-10-23: 1 [IU] via SUBCUTANEOUS

## 2016-10-17 MED ORDER — INSULIN ASPART 100 UNIT/ML ~~LOC~~ SOLN
10.0000 [IU] | Freq: Once | SUBCUTANEOUS | Status: AC
Start: 2016-10-17 — End: 2016-10-17
  Administered 2016-10-17: 10 [IU] via INTRAVENOUS
  Filled 2016-10-17: qty 1

## 2016-10-17 MED ORDER — DEXTROSE 50 % IV SOLN
1.0000 | Freq: Once | INTRAVENOUS | Status: AC
  Administered 2016-10-17: 50 mL via INTRAVENOUS
  Filled 2016-10-17: qty 50

## 2016-10-17 MED ORDER — HYDROCODONE-ACETAMINOPHEN 5-325 MG PO TABS
1.0000 | ORAL_TABLET | ORAL | Status: DC | PRN
  Administered 2016-10-17 – 2016-10-23 (×24): 2 via ORAL
  Filled 2016-10-17 (×12): qty 2
  Filled 2016-10-17: qty 1
  Filled 2016-10-17 (×6): qty 2
  Filled 2016-10-17: qty 1
  Filled 2016-10-17 (×8): qty 2

## 2016-10-17 MED ORDER — SODIUM CHLORIDE 0.9% FLUSH
3.0000 mL | Freq: Two times a day (BID) | INTRAVENOUS | Status: DC
  Administered 2016-10-17 – 2016-10-22 (×6): 3 mL via INTRAVENOUS

## 2016-10-17 MED ORDER — SODIUM POLYSTYRENE SULFONATE 15 GM/60ML PO SUSP
30.0000 g | Freq: Once | ORAL | Status: AC
  Administered 2016-10-17: 30 g via ORAL
  Filled 2016-10-17: qty 120

## 2016-10-17 MED ORDER — ACETAMINOPHEN 325 MG PO TABS
650.0000 mg | ORAL_TABLET | Freq: Four times a day (QID) | ORAL | Status: DC | PRN
  Administered 2016-10-17 – 2016-10-20 (×3): 650 mg via ORAL
  Filled 2016-10-17 (×4): qty 2

## 2016-10-17 MED ORDER — ENSURE ENLIVE PO LIQD
237.0000 mL | Freq: Three times a day (TID) | ORAL | Status: DC
Start: 2016-10-18 — End: 2016-10-23
  Administered 2016-10-18 – 2016-10-23 (×14): 237 mL via ORAL

## 2016-10-17 MED ORDER — SODIUM BICARBONATE 8.4 % IV SOLN
50.0000 meq | Freq: Once | INTRAVENOUS | Status: AC
  Administered 2016-10-17: 50 meq via INTRAVENOUS
  Filled 2016-10-17: qty 50

## 2016-10-17 MED ORDER — BARRIER CREAM NON-SPECIFIED
1.0000 "application " | TOPICAL_CREAM | Freq: Two times a day (BID) | TOPICAL | Status: DC
  Administered 2016-10-17 – 2016-10-22 (×8): 1 via TOPICAL
  Filled 2016-10-17: qty 1

## 2016-10-17 NOTE — Progress Notes (Signed)
[facility] [place]   CC: Follow up peripheral artery occlusive disease  History of Present Illness Ronald Simpson is a 71 y.o. male whom Dr. 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  He has continued failing health.  He was admitted to [facility] in May of 2018 with respiratory failure on mechanical ventilation for sepsis and extremely low blood sugars. He has had continued slow healing of his amputation and is having local wound care at the nursing facility. 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   Dr. Arbie Simpson last evaluated pt on 6-132-18. At that time he had healing of the lateral aspect of his left foot wound over the fifth metatarsal head. Continued to have open ulceration over the fourth metatarsal head and at the lateral aspect of the third metatarsal head. Diminished overall clinical deterioration and weight loss. No option for reconstruction. Will need continued local wound care. Had the open amputation on 04/04/2016. Very slow healing related to poor nutritional status and somewhat compromised arterial flow. Dr. Arbie Simpson advised follow up in 2 months.   He returns today after phone call received from Ronald Simpson from [facility] stating that patient was experiencing worsening pain and wound condition; apparently as relates to his left foot, 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 amputation site, which is black and gangrenous, no purulence.  When I walked in the exam room pt was holding his head, stated "I'm about to fall out".   I tried to check an automated blood pressure, no reading obtained in his right upper arm. His pulse was 71/minute, Sao2 on RA was 96-100%. Unable to obtain Doppler signal at right brachial, Dr. Randie Simpson able to detect left brachial Doppler signal.   + Doppler signal at left pedal PT.     Past Medical History:  Diagnosis Date  . CAD (coronary artery disease)    a. remote CABG.  . Chronic  anemia   . Chronic combined systolic and diastolic CHF (congestive heart failure) (HCC)   . Cirrhosis (HCC)   . CKD (chronic kidney disease), stage III   . CVA (cerebral infarction)   . DM (diabetes mellitus), type 2 with peripheral vascular complications (HCC)   . History of hepatitis C 1990s   Hep B immune  . Hypoalbuminemia   . IDDM (insulin dependent diabetes mellitus) (HCC)   . Mitral regurgitation    a. s/p prior ring annuloplasty prosthesis per echo with mod MR by echo 07/2016.  Marland Kitchen. PAF (paroxysmal atrial fibrillation) (HCC)    a. not on anticoagulation likely due to h/o IC hemorrhage, prior GIB, cirrhosis. b. suspected prior LAA clipping based on imaging.  . Peripheral neuropathy   . Peripheral vascular disease (HCC)   . Proteinuria     Social History Social History  Substance Use Topics  . Smoking status: Former Smoker    Packs/day: 0.10    Years: 54.00    Types: Cigarettes    Quit date: 04/25/2016  . Smokeless tobacco: Never Used  . Alcohol use Yes     Comment: 04/02/2016 "aint suppose to drink in the nursing home"    Family History Family History  Problem Relation Age of Onset  . Hypertension Other   . Diabetes Other   . Mental illness Other   . Lupus Other   . Diabetes Father   . Cancer Neg Hx   . Heart disease Neg Hx   . Stroke Neg Hx  Past Surgical History:  Procedure Laterality Date  . AMPUTATION Right 01/07/2016   Procedure: AMPUTATION ABOVE KNEE;  Surgeon: Ronald Earthly, [facility];  Location: [facility];  Service: Vascular;  Laterality: Right;  . AMPUTATION Left 04/04/2016   Procedure: LEFT FOURTH AND FIFTH  TOE AMPUTATION;  Surgeon: Ronald Earthly, [facility];  Location: [facility];  Service: Vascular;  Laterality: Left;  . BELOW KNEE LEG AMPUTATION Right   . CORONARY ARTERY BYPASS GRAFT  2005  . ESOPHAGOGASTRODUODENOSCOPY N/A 05/17/2016   Procedure: ESOPHAGOGASTRODUODENOSCOPY (EGD);  Surgeon: Ronald Dare, Ronald Simpson;  Location: [facility];  Service: Simpson;  Laterality: N/A;   . IR GENERIC HISTORICAL  07/07/2016   IR PARACENTESIS 07/07/2016 Ronald Crane, [facility] Simpson [facility]  . IR THORACENTESIS ASP PLEURAL SPACE W/IMG GUIDE  08/04/2016  . PERIPHERAL VASCULAR CATHETERIZATION N/A 01/11/2016   Procedure: Lower Extremity Angiography;  Surgeon: Ronald Libman, Ronald [facility];  Location: [facility];  Service: Cardiovascular;  Laterality: N/A;  . STERNOTOMY      Allergies  Allergen Reactions  . Hydralazine Other (See Comments)    4/21-4/26/18 pleuritic chest pain with bilateral effusions and pericardial rub. Rule out hydralazine drug-induced lupus   . Percocet [Oxycodone-Acetaminophen] Other (See Comments)    Needing narcan on multiple occasions due to oversedation after getting percocet   . Tramadol Other (See Comments)    Excess sedation; ? Related to pre-existing hepatic dysfunction with PMH Hep C    Current Outpatient Prescriptions  Medication Sig Dispense Refill  . acetaminophen (TYLENOL) 325 MG tablet Take 650 mg by mouth every 6 (six) hours as needed.    Marland Kitchen aspirin EC 81 MG EC tablet Take 1 tablet (81 mg total) by mouth daily. 30 tablet 1  . atorvastatin (LIPITOR) 20 MG tablet Take 20 mg by mouth daily.    . barrier cream (NON-SPECIFIED) CREA Apply 1 application topically 2 (two) times daily.    . carvedilol (COREG) 6.25 MG tablet Take 6.25 mg by mouth 2 (two) times daily with a meal.    . colchicine 0.6 MG tablet Take 0.6 mg by mouth daily.    . digoxin (LANOXIN) 0.125 MG tablet Take 1 tablet (0.125 mg total) by mouth daily.    . ferrous sulfate 325 (65 FE) MG tablet Take 1 tablet (325 mg total) by mouth daily with breakfast.  3  . Insulin Glargine (BASAGLAR KWIKPEN) 100 UNIT/ML SOPN Inject 8 Units into the skin at bedtime.    Marland Kitchen ipratropium-albuterol (DUONEB) 0.5-2.5 (3) MG/3ML SOLN Take 3 mLs by nebulization 3 (three) times daily. May also use via nebulizer every 6 hours as needed for shortness of breath    . levothyroxine (SYNTHROID, LEVOTHROID) 25 MCG  tablet Take 25 mcg by mouth daily before breakfast.    . nitroGLYCERIN (NITROSTAT) 0.4 MG SL tablet Place 0.4 mg under the tongue every 5 (five) minutes as needed for chest pain.    . Nutritional Supplements (ENSURE ENLIVE PO) Take 237 mLs by mouth 3 (three) times daily between meals.    . OXYGEN Inhale into the lungs. 2lpm via [place] prn for shortness of breath Simpson O2 sat <90%    . pantoprazole (PROTONIX) 40 MG tablet Take 1 tablet (40 mg total) by mouth daily. 30 tablet 3  . saccharomyces boulardii (FLORASTOR) 250 MG capsule Take 250 mg by mouth 2 (two) times daily.    . sacubitril-valsartan (ENTRESTO) 97-103 MG Take 1 tablet by mouth 2 (two) times daily.    Marland Kitchen senna (  SENOKOT) 8.6 MG TABS tablet Take 1 tablet (8.6 mg total) by mouth daily as needed for mild constipation. 120 each 0  . sertraline (ZOLOFT) 25 MG tablet Take 25 mg by mouth daily.    Marland Kitchen spironolactone (ALDACTONE) 25 MG tablet Take 1 tablet (25 mg total) by mouth daily.    Marland Kitchen torsemide (DEMADEX) 20 MG tablet Take 3 tablets (60 mg total) by mouth daily.     No current facility-administered medications for this visit.     ROS: See HPI for pertinent positives and negatives.   Physical Examination  Vitals:   10/17/16 1358  BP: (!) 82/45  Pulse: 82  Resp: 16  Temp: (!) 97.4 F (36.3 C)  Weight: 147 lb (66.7 kg)  Height: 5\' 11"  (1.803 m)   Body mass index is 20.5 kg/m.  General: A&O x 3, WDWN, male seated in wheel chair. Gait: seated in w/c Eyes: PERRLA. Pulmonary: Respirations are non labored, CTAB, limited air movement Cardiac: regular rhythm and rate, no detected murmur.         Carotid Bruits Right Left   Negative Negative   Radial pulses are faintly palpable bilaterally   Adominal aortic pulse is not palpable                         VASCULAR EXAM: Extremities with ischemic changes: dark dry necrosis at left 3rd toe, and 4th/5th toe amputation site.  No drainage. Right AKA stump with no wounds noted.                                                                                                            LE Pulses Right Left       FEMORAL   palpable  not palpable        POPLITEAL  AKA   not palpable       POSTERIOR TIBIAL AKA  + Doppler signal        DORSALIS PEDIS      ANTERIOR TIBIAL AKA  non-Dopplerable    Abdomen: soft, NT, no palpable masses. Skin: no rashes, see Extremities. Musculoskeletal: cachectic, thin, see Extremities.   Neurologic: A&O X 3; Appropriate Affect ; SENSATION: normal; MOTOR FUNCTION:  moving all extremities equally, motor strength 3/5 throughout. Speech is fluent/normal. Holding his head in his hand.     ASSESSMENT: Ronald Simpson is a 71 y.o. male who presents with gangrene of the left lateral forefoot and symptomatic hypotension. He has a history of sepsis in May of 2018.  Dr. Randie Heinz spoke with and examined pt.  To [facility] ED, hypotensive, symptomatic, unable to obtain brachial blood pressure by Doppler.  I attempted to call report to Triage at [facility] ED, no one could take my call as they were triaging pts.    PLAN:  To [facility] ED, hypotensive, unable to obtain brachial blood pressure by Doppler.     Charisse March, RN, MSN, FNP-C [facility] [place] Phone: [phone]  Clinic [facility]: Ronald Heinz  10/17/16 2:23 PM
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
## 2016-10-17 NOTE — ED Triage Notes (Signed)
Pt sent to the ED for hypotension. EMS reports a pressure of 94/63 with weak radial pulses. EMS reports unable to get an oxygen saturation. EMS also reports periods of dizziness and tachypnea but both resolve itself.   Pt has no complaints at this time other then being cold.

## 2016-10-17 NOTE — Progress Notes (Signed)
Pharmacy Antibiotic Note  Ronald Simpson is a 71 y.o. male admitted on 10/17/2016 with cellulitis.  Pharmacy has been consulted for vancomycin and zosyn dosing. Pt is afebrile and WBC is elevated at 11.1. SCr is well above baseline at 3.53. Lactic acid is also elevated at 2.68.   Plan: Vancomycin 1gm IV x 1 then 750mg  IV Q48H Zosyn 3.375gm IV x 1 then 2.25mg  IV Q8H F/u renal fxn, C&S, clinical status and trough at SS  Height: 5\' 11"  (180.3 cm) Weight: 140 lb (63.5 kg) IBW/kg (Calculated) : 75.3  Temp (24hrs), Avg:97.3 F (36.3 C), Min:97.1 F (36.2 C), Max:97.4 F (36.3 C)   Recent Labs Lab 10/17/16 1618 10/17/16 1630  WBC 11.1*  --   CREATININE 3.53*  --   LATICACIDVEN  --  2.68*    Estimated Creatinine Clearance: 18 mL/min (A) (by C-G formula based on SCr of 3.53 mg/dL (H)).    Allergies  Allergen Reactions  . Hydralazine Other (See Comments)    4/21-4/26/18 pleuritic chest pain with bilateral effusions and pericardial rub. Rule out hydralazine drug-induced lupus   . Percocet [Oxycodone-Acetaminophen] Other (See Comments)    Needing narcan on multiple occasions due to oversedation after getting percocet   . Tramadol Other (See Comments)    Excess sedation; ? Related to pre-existing hepatic dysfunction with PMH Hep C    Antimicrobials this admission: Vanc 7/6>> Zosyn 7/6>>  Dose adjustments this admission: N/A  Microbiology results: Pending  Thank you for allowing pharmacy to be a part of this patient's care.  Ronald Simpson, Ronald Simpson 10/17/2016 8:09 PM

## 2016-10-17 NOTE — ED Notes (Addendum)
Lactic acid = 2.68, EDP Pfeiffer notified

## 2016-10-17 NOTE — ED Notes (Signed)
No addl blood draw,  Pt enroute to inpatient. 

## 2016-10-18 DIAGNOSIS — I96 Gangrene, not elsewhere classified: Secondary | ICD-10-CM

## 2016-10-18 LAB — URINALYSIS, ROUTINE W REFLEX MICROSCOPIC
Bilirubin Urine: NEGATIVE
GLUCOSE, UA: NEGATIVE mg/dL
Ketones, ur: NEGATIVE mg/dL
NITRITE: NEGATIVE
PH: 5 (ref 5.0–8.0)
PROTEIN: 100 mg/dL — AB
SPECIFIC GRAVITY, URINE: 1.012 (ref 1.005–1.030)

## 2016-10-18 LAB — CBC WITH DIFFERENTIAL/PLATELET
BASOS PCT: 0 %
Basophils Absolute: 0 10*3/uL (ref 0.0–0.1)
Eosinophils Absolute: 0.1 10*3/uL (ref 0.0–0.7)
Eosinophils Relative: 1 %
HEMATOCRIT: 43.1 % (ref 39.0–52.0)
HEMOGLOBIN: 13.7 g/dL (ref 13.0–17.0)
Lymphocytes Relative: 19 %
Lymphs Abs: 1.6 10*3/uL (ref 0.7–4.0)
MCH: 27.7 pg (ref 26.0–34.0)
MCHC: 31.8 g/dL (ref 30.0–36.0)
MCV: 87.1 fL (ref 78.0–100.0)
Monocytes Absolute: 0.7 10*3/uL (ref 0.1–1.0)
Monocytes Relative: 8 %
NEUTROS ABS: 6.2 10*3/uL (ref 1.7–7.7)
NEUTROS PCT: 73 %
Platelets: 202 10*3/uL (ref 150–400)
RBC: 4.95 MIL/uL (ref 4.22–5.81)
RDW: 18.7 % — AB (ref 11.5–15.5)
WBC: 8.6 10*3/uL (ref 4.0–10.5)

## 2016-10-18 LAB — BLOOD CULTURE ID PANEL (REFLEXED)
Acinetobacter baumannii: NOT DETECTED
CANDIDA KRUSEI: NOT DETECTED
CANDIDA PARAPSILOSIS: NOT DETECTED
Candida albicans: NOT DETECTED
Candida glabrata: NOT DETECTED
Candida tropicalis: NOT DETECTED
ESCHERICHIA COLI: NOT DETECTED
Enterobacter cloacae complex: NOT DETECTED
Enterobacteriaceae species: NOT DETECTED
Enterococcus species: NOT DETECTED
Haemophilus influenzae: NOT DETECTED
KLEBSIELLA OXYTOCA: NOT DETECTED
KLEBSIELLA PNEUMONIAE: NOT DETECTED
Listeria monocytogenes: NOT DETECTED
Methicillin resistance: NOT DETECTED
Neisseria meningitidis: NOT DETECTED
PROTEUS SPECIES: NOT DETECTED
PSEUDOMONAS AERUGINOSA: NOT DETECTED
SERRATIA MARCESCENS: NOT DETECTED
STAPHYLOCOCCUS AUREUS BCID: NOT DETECTED
STAPHYLOCOCCUS SPECIES: DETECTED — AB
STREPTOCOCCUS PNEUMONIAE: NOT DETECTED
Streptococcus agalactiae: NOT DETECTED
Streptococcus pyogenes: NOT DETECTED
Streptococcus species: NOT DETECTED

## 2016-10-18 LAB — POTASSIUM
POTASSIUM: 5.3 mmol/L — AB (ref 3.5–5.1)
POTASSIUM: 5.6 mmol/L — AB (ref 3.5–5.1)

## 2016-10-18 LAB — COMPREHENSIVE METABOLIC PANEL
ALK PHOS: 218 U/L — AB (ref 38–126)
ALT: 14 U/L — ABNORMAL LOW (ref 17–63)
ANION GAP: 10 (ref 5–15)
AST: 18 U/L (ref 15–41)
Albumin: 2.5 g/dL — ABNORMAL LOW (ref 3.5–5.0)
BILIRUBIN TOTAL: 1 mg/dL (ref 0.3–1.2)
BUN: 82 mg/dL — ABNORMAL HIGH (ref 6–20)
CALCIUM: 8.8 mg/dL — AB (ref 8.9–10.3)
CO2: 18 mmol/L — ABNORMAL LOW (ref 22–32)
Chloride: 107 mmol/L (ref 101–111)
Creatinine, Ser: 2.93 mg/dL — ABNORMAL HIGH (ref 0.61–1.24)
GFR calc non Af Amer: 21 mL/min — ABNORMAL LOW (ref >60)
GFR, EST AFRICAN AMERICAN: 24 mL/min — AB (ref >60)
Glucose, Bld: 103 mg/dL — ABNORMAL HIGH (ref 65–99)
POTASSIUM: 5.3 mmol/L — AB (ref 3.5–5.1)
SODIUM: 135 mmol/L (ref 135–145)
TOTAL PROTEIN: 7.5 g/dL (ref 6.5–8.1)

## 2016-10-18 LAB — GLUCOSE, CAPILLARY
GLUCOSE-CAPILLARY: 139 mg/dL — AB (ref 65–99)
Glucose-Capillary: 106 mg/dL — ABNORMAL HIGH (ref 65–99)
Glucose-Capillary: 95 mg/dL (ref 65–99)
Glucose-Capillary: 102 mg/dL — ABNORMAL HIGH (ref 65–99)

## 2016-10-18 LAB — BASIC METABOLIC PANEL
Anion gap: 10 (ref 5–15)
BUN: 75 mg/dL — AB (ref 6–20)
CALCIUM: 8.4 mg/dL — AB (ref 8.9–10.3)
CO2: 18 mmol/L — ABNORMAL LOW (ref 22–32)
Chloride: 105 mmol/L (ref 101–111)
Creatinine, Ser: 2.5 mg/dL — ABNORMAL HIGH (ref 0.61–1.24)
GFR calc Af Amer: 29 mL/min — ABNORMAL LOW (ref >60)
GFR, EST NON AFRICAN AMERICAN: 25 mL/min — AB (ref >60)
Glucose, Bld: 97 mg/dL (ref 65–99)
Potassium: 4.7 mmol/L (ref 3.5–5.1)
SODIUM: 133 mmol/L — AB (ref 135–145)

## 2016-10-18 LAB — NA AND K (SODIUM & POTASSIUM), RAND UR
POTASSIUM UR: 26 mmol/L
Sodium, Ur: 84 mmol/L

## 2016-10-18 LAB — MRSA PCR SCREENING: MRSA BY PCR: POSITIVE — AB

## 2016-10-18 LAB — CREATININE, URINE, RANDOM: CREATININE, URINE: 63.94 mg/dL

## 2016-10-18 LAB — LACTIC ACID, PLASMA: Lactic Acid, Venous: 2 mmol/L (ref 0.5–1.9)

## 2016-10-18 MED ORDER — CHLORHEXIDINE GLUCONATE CLOTH 2 % EX PADS
6.0000 | MEDICATED_PAD | Freq: Every day | CUTANEOUS | Status: AC
  Administered 2016-10-18 – 2016-10-23 (×5): 6 via TOPICAL

## 2016-10-18 MED ORDER — MUPIROCIN 2 % EX OINT
1.0000 "application " | TOPICAL_OINTMENT | Freq: Two times a day (BID) | CUTANEOUS | Status: AC
  Administered 2016-10-18 – 2016-10-22 (×10): 1 via NASAL
  Filled 2016-10-18 (×2): qty 22

## 2016-10-18 MED ORDER — ADULT MULTIVITAMIN W/MINERALS CH
1.0000 | ORAL_TABLET | Freq: Every day | ORAL | Status: DC
  Administered 2016-10-18 – 2016-10-23 (×6): 1 via ORAL
  Filled 2016-10-18 (×6): qty 1

## 2016-10-18 NOTE — Progress Notes (Signed)
PHARMACY - PHYSICIAN COMMUNICATION CRITICAL VALUE ALERT - BLOOD CULTURE IDENTIFICATION (BCID)  Results for orders placed or performed during the hospital encounter of 10/17/16  Blood Culture ID Panel (Reflexed) (Collected: 10/17/2016  4:18 PM)  Result Value Ref Range   Enterococcus species NOT DETECTED NOT DETECTED   Listeria monocytogenes NOT DETECTED NOT DETECTED   Staphylococcus species DETECTED (A) NOT DETECTED   Staphylococcus aureus NOT DETECTED NOT DETECTED   Methicillin resistance NOT DETECTED NOT DETECTED   Streptococcus species NOT DETECTED NOT DETECTED   Streptococcus agalactiae NOT DETECTED NOT DETECTED   Streptococcus pneumoniae NOT DETECTED NOT DETECTED   Streptococcus pyogenes NOT DETECTED NOT DETECTED   Acinetobacter baumannii NOT DETECTED NOT DETECTED   Enterobacteriaceae species NOT DETECTED NOT DETECTED   Enterobacter cloacae complex NOT DETECTED NOT DETECTED   Escherichia coli NOT DETECTED NOT DETECTED   Klebsiella oxytoca NOT DETECTED NOT DETECTED   Klebsiella pneumoniae NOT DETECTED NOT DETECTED   Proteus species NOT DETECTED NOT DETECTED   Serratia marcescens NOT DETECTED NOT DETECTED   Haemophilus influenzae NOT DETECTED NOT DETECTED   Neisseria meningitidis NOT DETECTED NOT DETECTED   Pseudomonas aeruginosa NOT DETECTED NOT DETECTED   Candida albicans NOT DETECTED NOT DETECTED   Candida glabrata NOT DETECTED NOT DETECTED   Candida krusei NOT DETECTED NOT DETECTED   Candida parapsilosis NOT DETECTED NOT DETECTED   Candida tropicalis NOT DETECTED NOT DETECTED    Name of physician (or Provider) Contacted: Dr. Julio Sickssei-Bonsu  Changes to prescribed antibiotics required: Already on vancomycin and zosyn. No changes needed at this time.  Fredrik RiggerMarkle, Jaysun Wessels Sue 10/18/2016  5:48 PM

## 2016-10-18 NOTE — Progress Notes (Signed)
Patient has refused x2 for blood to be drawn for k+ check

## 2016-10-18 NOTE — Progress Notes (Signed)
Initial Nutrition Assessment  DOCUMENTATION CODES:   Severe malnutrition in context of chronic illness  INTERVENTION:   -Ensure Enlive po TID, each supplement provides 350 kcal and 20 grams of protein -MVI daily  NUTRITION DIAGNOSIS:   Malnutrition (Severe) related to chronic illness (PAD) as evidenced by energy intake < 75% for > or equal to 1 month, percent weight loss, moderate depletions of muscle mass, severe depletion of muscle mass, moderate depletion of body fat, severe depletion of body fat.  GOAL:   Patient will meet greater than or equal to 90% of their needs  MONITOR:   PO intake, Supplement acceptance, Labs, Weight trends, Skin, I & O's  REASON FOR ASSESSMENT:   Consult Assessment of nutrition requirement/status  ASSESSMENT:   Ronald Simpson is a 71 y.o. male with medical history significant for chronic systolic CHF, coronary artery disease with remote CABG, history of CVA, insulin-dependent diabetes mellitus, chronic kidney disease stage II, hepatitis C with cirrhosis, peripheral arterial disease status post amputations, now presenting to the emergency department at the direction of his vascular surgeon for evaluation of symptomatic hypotension  Pt reports ongoing poor appetite over the past year ("it wasn't worth a nickel"). He shares that his intake is extremely variable, but he usually eats well at breakfast, however, intake decreases throughout the day. He shares that he was consuming on average 2-3 Ensure supplements daily, dependent on intake. Pt was consuming lunch at time of visit, but only consumed about 25% of rice.   Pt reports a large amount of weight loss, however, unable to provide specifics other than he used to weigh over 200#. Per wt hx, pt has experienced a 25.6% wt loss over the past 3 months, which is significant for time frame.   Pt reports being optimistic about wound healing, however, is realistic that he may require another amputation with  poor healing. Discussed importance of good nutrition to promote healing throughout hospitalization. Provided examples of good protein sources and importance of controlling blood sugars to promote wound healing. Pt amenable to continue Ensure supplements.   Nutrition-Focused physical exam completed. Findings are moderate to severe fat depletion, moderate to severe muscle depletion, and no edema.   Dr. Darrick PennaFields ([facility]) in room at time of evaluation, who also reiterated importance of nutrition to healing and encouraged pt to adhere to nutrition care plan.   Pt with poor oral intake and would benefit from nutrient dense supplement. One Ensure Enlive supplement provides 350 kcals, 20 grams protein, and 44-45 grams of carbohydrate vs one Glucerna shake supplement, which provides 220 kcals, 10 grams of protein, and 26 grams of carbohydrate. Given pt's hx of DM, RD will continue to monitor PO intake, CBGS, and adjust supplement regimen as appropriate.   Labs reviewed: CBGS: 106-139. Last Hgb A1c: 6.8 (07/03/16).   Diet Order:  Diet heart healthy/carb modified Room service appropriate? Yes; Fluid consistency: Thin  Skin:  Wound (see comment) (gangrenous lt medial toe)  Last BM:  PTA  Height:   Ht Readings from Last 1 Encounters:  10/17/16 5\' 11"  (1.803 m)    Weight:   Wt Readings from Last 1 Encounters:  10/18/16 133 lb 2.5 oz (60.4 kg)    Ideal Body Weight:  55.6 kg  BMI:  Body mass index is 18.57 kg/m.  Estimated Nutritional Needs:   Kcal:  1800-2000  Protein:  105-120 grams  Fluid:  > 1.8 L  EDUCATION NEEDS:   Education needs addressed  Jawanda Passey A. Mayford KnifeWilliams, RD, LDN, CDE  Pager: [phone] After hours Pager: [phone]

## 2016-10-18 NOTE — Consult Note (Signed)
Referring Physician: Julio Sicks  Patient name: Ronald Simpson MRN: [id number] DOB: [birth year] Sex: male  REASON FOR CONSULT: gangrene left 3rd toe  HPI: Ronald Simpson is a 71 y.o. male admitted yesterday with hypotension and multiple metabolic derangements.  Pt well known to my partner Dr Arbie Cookey.  Pt has unreconstructable tibial disease.  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  These have been very slow to heal.  Pt now complains of pain in 3rd toe with dark color change. Other medical problems include severe protein calorie malnutrition, CKD 3, DM, cirrhosis, paroxysmal afib all of which have been stable.  Pt currently receiving antibiotics for presumed sepsis.  Feels better today.  Past Medical History:  Diagnosis Date  . CAD (coronary artery disease)    a. remote CABG.  . Chronic anemia   . Chronic combined systolic and diastolic CHF (congestive heart failure) (HCC)   . Cirrhosis (HCC)   . CKD (chronic kidney disease), stage III   . CVA (cerebral infarction)   . DM (diabetes mellitus), type 2 with peripheral vascular complications (HCC)   . History of hepatitis C 1990s   Hep B immune  . Hypoalbuminemia   . IDDM (insulin dependent diabetes mellitus) (HCC)   . Mitral regurgitation    a. s/p prior ring annuloplasty prosthesis per echo with mod MR by echo 07/2016.  Marland Kitchen PAF (paroxysmal atrial fibrillation) (HCC)    a. not on anticoagulation likely due to h/o IC hemorrhage, prior GIB, cirrhosis. b. suspected prior LAA clipping based on imaging.  . Peripheral neuropathy   . Peripheral vascular disease (HCC)   . Proteinuria    Past Surgical History:  Procedure Laterality Date  . AMPUTATION Right 01/07/2016   Procedure: AMPUTATION ABOVE KNEE;  Surgeon: Larina Earthly, MD;  [facility];  Service: Vascular;  Laterality: Right;  . AMPUTATION Left 04/04/2016   Procedure: LEFT FOURTH AND FIFTH  TOE AMPUTATION;  Surgeon: Larina Earthly,  MD;  Location: [facility] OR;  Service: Vascular;  Laterality: Left;  . BELOW KNEE LEG AMPUTATION Right   . CORONARY ARTERY BYPASS GRAFT  2005  . ESOPHAGOGASTRODUODENOSCOPY N/A 05/17/2016   Procedure: ESOPHAGOGASTRODUODENOSCOPY (EGD);  Surgeon: Meryl Dare, MD;  Location: [facility] ENDOSCOPY;  Service: Endoscopy;  Laterality: N/A;  . IR GENERIC HISTORICAL  07/07/2016   IR PARACENTESIS 07/07/2016 Gershon Crane, PA-C [facility]-INTERV RAD  . IR THORACENTESIS ASP PLEURAL SPACE W/IMG GUIDE  08/04/2016  . PERIPHERAL VASCULAR CATHETERIZATION N/A 01/11/2016   Procedure: Lower Extremity Angiography;  Surgeon: Nada Libman, MD;  Location: [facility] INVASIVE CV LAB;  Service: Cardiovascular;  Laterality: N/A;  . STERNOTOMY      Family History  Problem Relation Age of Onset  . Hypertension Other   . Diabetes Other   . Mental illness Other   . Lupus Other   . Diabetes Father   . Cancer Neg Hx   . Heart disease Neg Hx   . Stroke Neg Hx     SOCIAL HISTORY: Social History   Social History  . Marital status: Widowed    Spouse name: N/A  . Number of children: N/A  . Years of education: N/A   Occupational History  . Not on file.   Social History Main Topics  . Smoking status: Former Smoker    Packs/day: 0.10    Years: 54.00    Types: Cigarettes    Quit date:  04/25/2016  . Smokeless tobacco: Never Used  . Alcohol use Yes     Comment: 04/02/2016 "aint suppose to drink in the nursing home"  . Drug use: Yes    Types: Cocaine, Marijuana     Comment: marijuana use daily, rare cocain use.  Previously did snort and inject drugs through 2017. Hasn't had access to street drugs from 10/201 7 through February/201 8  . Sexual activity: Not Currently   Other Topics Concern  . Not on file   Social History Narrative   As of fall/2017 patient had been living in [place] but then entered SNF in [place]. His wife passed away in summer 2017.    Allergies  Allergen Reactions  . Hydralazine Other (See Comments)     4/21-4/26/18 pleuritic chest pain with bilateral effusions and pericardial rub. Rule out hydralazine drug-induced lupus   . Percocet [Oxycodone-Acetaminophen] Other (See Comments)    Needing narcan on multiple occasions due to oversedation after getting percocet   . Tramadol Other (See Comments)    Excess sedation; ? Related to pre-existing hepatic dysfunction with PMH Hep C    Current Facility-Administered Medications  Medication Dose Route Frequency Provider Last Rate Last Dose  . acetaminophen (TYLENOL) tablet 650 mg  650 mg Oral Q6H PRN Opyd, Lavone Neri, MD   650 mg at 10/17/16 1954   Or  . acetaminophen (TYLENOL) suppository 650 mg  650 mg Rectal Q6H PRN Opyd, Lavone Neri, MD      . barrier cream (non-specified) 1 application  1 application Topical BID Opyd, Lavone Neri, MD   1 application at 10/18/16 1000  . bisacodyl (DULCOLAX) EC tablet 5 mg  5 mg Oral Daily PRN Opyd, Lavone Neri, MD      . Chlorhexidine Gluconate Cloth 2 % PADS 6 each  6 each Topical Q0600 Jackie Plum, MD   6 each at 10/18/16 0910  . digoxin (LANOXIN) tablet 0.125 mg  0.125 mg Oral Daily Opyd, Lavone Neri, MD   0.125 mg at 10/18/16 0914  . diphenhydrAMINE (BENADRYL) capsule 25 mg  25 mg Oral Q6H PRN Opyd, Lavone Neri, MD   25 mg at 10/17/16 2228  . feeding supplement (ENSURE ENLIVE) (ENSURE ENLIVE) liquid 237 mL  237 mL Oral TID BM Opyd, Lavone Neri, MD   237 mL at 10/18/16 0933  . heparin injection 5,000 Units  5,000 Units Subcutaneous Q8H Opyd, Lavone Neri, MD   5,000 Units at 10/17/16 2228  . HYDROcodone-acetaminophen (NORCO/VICODIN) 5-325 MG per tablet 1-2 tablet  1-2 tablet Oral Q4H PRN Opyd, Lavone Neri, MD   2 tablet at 10/18/16 1241  . insulin aspart (novoLOG) injection 0-5 Units  0-5 Units Subcutaneous QHS Opyd, Timothy S, MD      . insulin aspart (novoLOG) injection 0-9 Units  0-9 Units Subcutaneous TID WC Opyd, Lavone Neri, MD   1 Units at 10/18/16 1200  . levothyroxine (SYNTHROID, LEVOTHROID) tablet 25 mcg  25 mcg Oral  QAC breakfast Opyd, Lavone Neri, MD   25 mcg at 10/18/16 0829  . mupirocin ointment (BACTROBAN) 2 % 1 application  1 application Nasal BID Jackie Plum, MD   1 application at 10/18/16 0910  . ondansetron (ZOFRAN) tablet 4 mg  4 mg Oral Q6H PRN Opyd, Lavone Neri, MD       Or  . ondansetron (ZOFRAN) injection 4 mg  4 mg Intravenous Q6H PRN Opyd, Lavone Neri, MD      . pantoprazole (PROTONIX) EC tablet 40 mg  40 mg  Oral Daily Opyd, Lavone Neriimothy S, MD   40 mg at 10/18/16 0914  . piperacillin-tazobactam (ZOSYN) IVPB 2.25 g  2.25 g Intravenous Q8H Rumbarger, Faye RamsayRachel L, RPH   Stopped at 10/18/16 1003  . polyethylene glycol (MIRALAX / GLYCOLAX) packet 17 g  17 g Oral Daily PRN Opyd, Lavone Neriimothy S, MD      . saccharomyces boulardii (FLORASTOR) capsule 250 mg  250 mg Oral BID Opyd, Lavone Neriimothy S, MD   250 mg at 10/18/16 0910  . sertraline (ZOLOFT) tablet 25 mg  25 mg Oral Daily Opyd, Lavone Neriimothy S, MD   25 mg at 10/18/16 0913  . sodium chloride flush (NS) 0.9 % injection 3 mL  3 mL Intravenous Q12H Opyd, Lavone Neriimothy S, MD   3 mL at 10/17/16 2229  . [START ON 10/19/2016] vancomycin (VANCOCIN) IVPB 750 mg/150 ml premix  750 mg Intravenous Q48H Rumbarger, Rachel L, RPH        ROS:   General:  + weight loss, denies Fever, chills  Cardiac: No recent episodes of chest pain/pressure, no shortness of breath at rest.  + shortness of breath with exertion.  Denies history of atrial fibrillation or irregular heartbeat  Vascular: + history of rest pain in feet.  + history of non-healing ulcer, No history of DVT     Physical Examination  Vitals:   10/17/16 2355 10/18/16 0413 10/18/16 0831 10/18/16 1209  BP: (!) 112/97 107/61 116/72 98/77  Pulse: 87 84 80 81  Resp: 18 20 18 19   Temp: (!) 97.4 F (36.3 C) (!) 97.4 F (36.3 C) 97.9 F (36.6 C) 97.7 F (36.5 C)  TempSrc: Oral Oral Oral Oral  SpO2: 100% 96% 100% 100%  Weight:  133 lb 2.5 oz (60.4 kg)    Height:        Body mass index is 18.57 kg/m.  General:  Alert and  oriented, no acute distress HEENT: Normal Abdomen: Soft, non-tender Skin: No rash, 2 cm opening at site of 4th toe amp with some granulation 2 cm by 3 mm less than 1 mm depth Extremity Pulses:  2+ radial, brachial, femoral, dorsalis pedis, posterior tibial pulses bilaterally Musculoskeletal: absent toes four five left foot 3rd toe black with gangrene  Neurologic: Upper and lower extremity motor 5/5 and symmetric  DATA:  CBC    Component Value Date/Time   WBC 8.6 10/18/2016 0321   RBC 4.95 10/18/2016 0321   HGB 13.7 10/18/2016 0321   HCT 43.1 10/18/2016 0321   PLT 202 10/18/2016 0321   MCV 87.1 10/18/2016 0321   MCH 27.7 10/18/2016 0321   MCHC 31.8 10/18/2016 0321   RDW 18.7 (H) 10/18/2016 0321   LYMPHSABS 1.6 10/18/2016 0321   MONOABS 0.7 10/18/2016 0321   EOSABS 0.1 10/18/2016 0321   BASOSABS 0.0 10/18/2016 0321    BMET    Component Value Date/Time   NA 135 10/18/2016 0321   NA 132 (A) 09/30/2016   K 5.3 (H) 10/18/2016 0637   CL 107 10/18/2016 0321   CO2 18 (L) 10/18/2016 0321   GLUCOSE 103 (H) 10/18/2016 0321   BUN 82 (H) 10/18/2016 0321   BUN 52 (A) 09/30/2016   CREATININE 2.93 (H) 10/18/2016 0321   CALCIUM 8.8 (L) 10/18/2016 0321   GFRNONAA 21 (L) 10/18/2016 0321   GFRAA 24 (L) 10/18/2016 0321     ASSESSMENT:  Admitted with sepsis and electrolyte derangements.  Renal failure has worsened.  Pt has new gangrenous toe and non healing left foot wound but  does not appear to have abscess   PLAN:  Would continue to correct his electrolytes may need renal consult if renal function does not improve.  Pt would like to consider 3rd toe amp but realizes risk of not healing is high.  Hydrogel to left foot wound once daily  Will recheck tomorrow   Fabienne Bruns, MD [facility] Office: [phone] Pager: [phone]
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
## 2016-10-19 DIAGNOSIS — E43 Unspecified severe protein-calorie malnutrition: Secondary | ICD-10-CM | POA: Insufficient documentation

## 2016-10-19 LAB — GLUCOSE, CAPILLARY
GLUCOSE-CAPILLARY: 148 mg/dL — AB (ref 65–99)
GLUCOSE-CAPILLARY: 102 mg/dL — AB (ref 65–99)
Glucose-Capillary: 133 mg/dL — ABNORMAL HIGH (ref 65–99)
Glucose-Capillary: 140 mg/dL — ABNORMAL HIGH (ref 65–99)

## 2016-10-19 LAB — BASIC METABOLIC PANEL
Anion gap: 8 (ref 5–15)
BUN: 77 mg/dL — AB (ref 6–20)
CHLORIDE: 106 mmol/L (ref 101–111)
CO2: 21 mmol/L — AB (ref 22–32)
Calcium: 8.8 mg/dL — ABNORMAL LOW (ref 8.9–10.3)
Creatinine, Ser: 2.58 mg/dL — ABNORMAL HIGH (ref 0.61–1.24)
GFR calc non Af Amer: 24 mL/min — ABNORMAL LOW (ref >60)
GFR, EST AFRICAN AMERICAN: 28 mL/min — AB (ref >60)
Glucose, Bld: 88 mg/dL (ref 65–99)
POTASSIUM: 4.6 mmol/L (ref 3.5–5.1)
SODIUM: 135 mmol/L (ref 135–145)

## 2016-10-19 LAB — URINE CULTURE

## 2016-10-19 LAB — POTASSIUM
POTASSIUM: 4.5 mmol/L (ref 3.5–5.1)
POTASSIUM: 4.3 mmol/L (ref 3.5–5.1)
Potassium: 4.1 mmol/L (ref 3.5–5.1)

## 2016-10-19 LAB — DIGOXIN LEVEL: DIGOXIN LVL: 0.8 ng/mL (ref 0.8–2.0)

## 2016-10-19 LAB — PREALBUMIN: PREALBUMIN: 11.1 mg/dL — AB (ref 18–38)

## 2016-10-19 LAB — UREA NITROGEN, URINE: UREA NITROGEN UR: 223 mg/dL

## 2016-10-19 MED ORDER — VANCOMYCIN HCL IN DEXTROSE 750-5 MG/150ML-% IV SOLN
750.0000 mg | INTRAVENOUS | Status: DC
  Administered 2016-10-19: 750 mg via INTRAVENOUS
  Filled 2016-10-19 (×2): qty 150

## 2016-10-19 MED ORDER — PIPERACILLIN-TAZOBACTAM 3.375 G IVPB
3.3750 g | Freq: Three times a day (TID) | INTRAVENOUS | Status: DC
  Administered 2016-10-19 – 2016-10-21 (×5): 3.375 g via INTRAVENOUS
  Filled 2016-10-19 (×6): qty 50

## 2016-10-19 MED ORDER — SODIUM CHLORIDE 0.9 % IV SOLN
INTRAVENOUS | Status: DC
  Administered 2016-10-19 – 2016-10-23 (×8): via INTRAVENOUS

## 2016-10-19 NOTE — Progress Notes (Addendum)
[facility]  Subjective  - No new complaints.   Objective 104/73 71 98.1 F (36.7 C) (Oral) 12 100%  Intake/Output Summary (Last 24 hours) at 10/19/16 0822 Last data filed at 10/19/16 0414  Gross per 24 hour  Intake              700 ml  Output              975 ml  Net             -275 ml   Left third toe wound with gangrene, dry dressing in place Heart RRR Lungs non labored breathing   Assessment/Planning: Admitted with sepsis and electrolyte derangements.  Renal failure has worsened.  Pt has new gangrenous toe and non healing left foot wound but does not appear to have abscess Possible plan for third toe amputation, healing issue due to sever PAD.  Pt has unreconstructable tibial disease. Hydrogel to left foot wound once daily WBC decreased now 8.6 Cr increased to 2.58, UO total daily 975 CKD IV antibiotics Zosyn and Vanco   COLLINS, EMMA [name] 10/19/2016 8:22 AM -- Pt currently skeptical about further toe amp. Will make Dr Early aware of pt admission.  He will see tomorrow Continue current care as above  Charles Fields, MD [facility] Office: [phone] Pager: [phone]  Laboratory Lab Results:  Recent Labs  10/17/16 1618 10/18/16 0321  WBC 11.1* 8.6  HGB 15.7 13.7  HCT 48.2 43.1  PLT 245 202   BMET  Recent Labs  10/18/16 1926 10/19/16 0342  NA 133* 135  K 4.7 4.5  4.6  CL 105 106  CO2 18* 21*  GLUCOSE 97 88  BUN 75* 77*  CREATININE 2.50* 2.58*  CALCIUM 8.4* 8.8*    COAG Lab Results  Component Value Date   INR 1.04 10/17/2016   INR 1.57 08/27/2016   INR 1.26 08/02/2016   PROTIME 48.3 (A) 07/18/2016   PROTIME 62.4 (A) 07/15/2016   PROTIME 31.5 (A) 07/10/2016   No results found for: PTT    
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
## 2016-10-19 NOTE — Progress Notes (Signed)
Pharmacy Antibiotic Note  Ronald Simpson is a 71 y.o. male admitted on 10/17/2016 with cellulitis.  Pharmacy has been consulted for vancomycin and zosyn dosing.   Day # 3 of antibiotics Scr now improved Afebrile, WBC 8.6 Cultures negative to date (contaminant [facility]) Vascular to see patient    Plan: Increase Vancomycin to 750 mg iv q 24 hours Increase Zosyn to 3.375 grams iv Q 8 hours Continue to follow  Height: 5\' 11"  (180.3 cm) Weight: 137 lb 12.6 oz (62.5 kg) IBW/kg (Calculated) : 75.3  Temp (24hrs), Avg:98 F (36.7 C), Min:97.7 F (36.5 C), Max:98.3 F (36.8 C)   Recent Labs Lab 10/17/16 1618 10/17/16 1630 10/17/16 2117 10/17/16 2342 10/18/16 0321 10/18/16 1926 10/19/16 0342  WBC 11.1*  --   --   --  8.6  --   --   CREATININE 3.53*  --   --   --  2.93* 2.50* 2.58*  LATICACIDVEN  --  2.68* 3.0* 2.0*  --   --   --     Estimated Creatinine Clearance: 24.2 mL/min (A) (by C-G formula based on SCr of 2.58 mg/dL (H)).    Allergies  Allergen Reactions  . Hydralazine Other (See Comments)    4/21-4/26/18 pleuritic chest pain with bilateral effusions and pericardial rub. Rule out hydralazine drug-induced lupus   . Percocet [Oxycodone-Acetaminophen] Other (See Comments)    Needing narcan on multiple occasions due to oversedation after getting percocet   . Tramadol Other (See Comments)    Excess sedation; ? Related to pre-existing hepatic dysfunction with PMH Hep C    Antimicrobials this admission: Vanc 7/6>> Zosyn 7/6>>  Dose adjustments this admission: N/A  Microbiology results: Pending  Thank you for allowing pharmacy to be a part of this patient's care.  Elwin Sleightowell, Amair Shrout Kay 10/19/2016 12:08 PM

## 2016-10-20 ENCOUNTER — Encounter (HOSPITAL_COMMUNITY): Payer: Self-pay | Admitting: Certified Registered Nurse Anesthetist

## 2016-10-20 ENCOUNTER — Inpatient Hospital Stay (HOSPITAL_COMMUNITY): Payer: Medicare Other | Admitting: Certified Registered Nurse Anesthetist

## 2016-10-20 ENCOUNTER — Encounter (HOSPITAL_COMMUNITY)
Admission: EM | Disposition: A | Discharge status: Skilled Nursing Facility | Payer: Self-pay | Source: Home / Self Care | Attending: Internal Medicine

## 2016-10-20 ENCOUNTER — Inpatient Hospital Stay (HOSPITAL_COMMUNITY): Admission: RE | Admit: 2016-10-20 | Payer: Medicare Other | Source: Ambulatory Visit

## 2016-10-20 DIAGNOSIS — E875 Hyperkalemia: Secondary | ICD-10-CM

## 2016-10-20 DIAGNOSIS — N179 Acute kidney failure, unspecified: Secondary | ICD-10-CM

## 2016-10-20 DIAGNOSIS — I10 Essential (primary) hypertension: Secondary | ICD-10-CM

## 2016-10-20 HISTORY — PX: TRANSMETATARSAL AMPUTATION: SHX6197

## 2016-10-20 LAB — GLUCOSE, CAPILLARY
GLUCOSE-CAPILLARY: 142 mg/dL — AB (ref 65–99)
GLUCOSE-CAPILLARY: 145 mg/dL — AB (ref 65–99)
GLUCOSE-CAPILLARY: 124 mg/dL — AB (ref 65–99)
GLUCOSE-CAPILLARY: 146 mg/dL — AB (ref 65–99)
GLUCOSE-CAPILLARY: 228 mg/dL — AB (ref 65–99)

## 2016-10-20 LAB — BASIC METABOLIC PANEL
ANION GAP: 7 (ref 5–15)
BUN: 61 mg/dL — ABNORMAL HIGH (ref 6–20)
CO2: 21 mmol/L — ABNORMAL LOW (ref 22–32)
Calcium: 8.3 mg/dL — ABNORMAL LOW (ref 8.9–10.3)
Chloride: 108 mmol/L (ref 101–111)
Creatinine, Ser: 1.77 mg/dL — ABNORMAL HIGH (ref 0.61–1.24)
GFR calc Af Amer: 44 mL/min — ABNORMAL LOW (ref >60)
GFR, EST NON AFRICAN AMERICAN: 38 mL/min — AB (ref >60)
Glucose, Bld: 149 mg/dL — ABNORMAL HIGH (ref 65–99)
POTASSIUM: 3.9 mmol/L (ref 3.5–5.1)
SODIUM: 136 mmol/L (ref 135–145)

## 2016-10-20 LAB — POTASSIUM
POTASSIUM: 4.4 mmol/L (ref 3.5–5.1)
POTASSIUM: 4.3 mmol/L (ref 3.5–5.1)

## 2016-10-20 LAB — CULTURE, BLOOD (ROUTINE X 2): Special Requests: ADEQUATE

## 2016-10-20 SURGERY — AMPUTATION, FOOT, TRANSMETATARSAL
Anesthesia: General | Site: Toe | Laterality: Left

## 2016-10-20 MED ORDER — SODIUM CHLORIDE 0.9 % IV SOLN
INTRAVENOUS | Status: DC | PRN
  Administered 2016-10-20: 07:00:00 via INTRAVENOUS

## 2016-10-20 MED ORDER — MIDAZOLAM HCL 2 MG/2ML IJ SOLN
INTRAMUSCULAR | Status: AC
Start: 2016-10-20 — End: ?
  Filled 2016-10-20: qty 2

## 2016-10-20 MED ORDER — PHENYLEPHRINE HCL 10 MG/ML IJ SOLN
INTRAVENOUS | Status: DC | PRN
  Administered 2016-10-20: 20 ug/min via INTRAVENOUS

## 2016-10-20 MED ORDER — ONDANSETRON HCL 4 MG/2ML IJ SOLN
INTRAMUSCULAR | Status: DC | PRN
  Administered 2016-10-20: 4 mg via INTRAVENOUS

## 2016-10-20 MED ORDER — DEXAMETHASONE SODIUM PHOSPHATE 10 MG/ML IJ SOLN
INTRAMUSCULAR | Status: AC
  Filled 2016-10-20: qty 1

## 2016-10-20 MED ORDER — PROPOFOL 10 MG/ML IV BOLUS
INTRAVENOUS | Status: AC
  Filled 2016-10-20: qty 20

## 2016-10-20 MED ORDER — LIDOCAINE 2% (20 MG/ML) 5 ML SYRINGE
INTRAMUSCULAR | Status: DC | PRN
  Administered 2016-10-20: 50 mg via INTRAVENOUS

## 2016-10-20 MED ORDER — ROCURONIUM BROMIDE 50 MG/5ML IV SOLN
INTRAVENOUS | Status: AC
  Filled 2016-10-20: qty 5

## 2016-10-20 MED ORDER — 0.9 % SODIUM CHLORIDE (POUR BTL) OPTIME
TOPICAL | Status: DC | PRN
  Administered 2016-10-20: 1000 mL

## 2016-10-20 MED ORDER — ONDANSETRON HCL 4 MG/2ML IJ SOLN
INTRAMUSCULAR | Status: AC
  Filled 2016-10-20: qty 2

## 2016-10-20 MED ORDER — LIDOCAINE HCL (CARDIAC) 20 MG/ML IV SOLN
INTRAVENOUS | Status: AC
  Filled 2016-10-20: qty 20

## 2016-10-20 MED ORDER — FENTANYL CITRATE (PF) 250 MCG/5ML IJ SOLN
INTRAMUSCULAR | Status: AC
  Filled 2016-10-20: qty 5

## 2016-10-20 MED ORDER — PROPOFOL 10 MG/ML IV BOLUS
INTRAVENOUS | Status: DC | PRN
Start: 2016-10-20 — End: 2016-10-20
  Administered 2016-10-20: 130 mg via INTRAVENOUS

## 2016-10-20 MED ORDER — VANCOMYCIN HCL IN DEXTROSE 1-5 GM/200ML-% IV SOLN
1000.0000 mg | INTRAVENOUS | Status: DC
  Administered 2016-10-20: 1000 mg via INTRAVENOUS
  Filled 2016-10-20 (×2): qty 200

## 2016-10-20 MED ORDER — PHENYLEPHRINE 40 MCG/ML (10ML) SYRINGE FOR IV PUSH (FOR BLOOD PRESSURE SUPPORT)
PREFILLED_SYRINGE | INTRAVENOUS | Status: DC | PRN
  Administered 2016-10-20: 80 ug via INTRAVENOUS
  Administered 2016-10-20: 40 ug via INTRAVENOUS
  Administered 2016-10-20: 80 ug via INTRAVENOUS
  Administered 2016-10-20: 40 ug via INTRAVENOUS
  Administered 2016-10-20 (×2): 80 ug via INTRAVENOUS

## 2016-10-20 MED ORDER — FENTANYL CITRATE (PF) 100 MCG/2ML IJ SOLN
INTRAMUSCULAR | Status: DC | PRN
  Administered 2016-10-20: 50 ug via INTRAVENOUS

## 2016-10-20 MED ORDER — MIDAZOLAM HCL 2 MG/2ML IJ SOLN
INTRAMUSCULAR | Status: DC | PRN
Start: 2016-10-20 — End: 2016-10-20
  Administered 2016-10-20: 1 mg via INTRAVENOUS

## 2016-10-20 MED ORDER — PHENYLEPHRINE 40 MCG/ML (10ML) SYRINGE FOR IV PUSH (FOR BLOOD PRESSURE SUPPORT)
PREFILLED_SYRINGE | INTRAVENOUS | Status: AC
  Filled 2016-10-20: qty 10

## 2016-10-20 MED ORDER — PIPERACILLIN-TAZOBACTAM 3.375 G IVPB 30 MIN
3.3750 g | INTRAVENOUS | Status: AC
  Administered 2016-10-20: 3.375 g via INTRAVENOUS
  Filled 2016-10-20: qty 50

## 2016-10-20 MED ORDER — ONDANSETRON HCL 4 MG/2ML IJ SOLN
INTRAMUSCULAR | Status: AC
  Filled 2016-10-20: qty 6

## 2016-10-20 SURGICAL SUPPLY — 33 items
BNDG GAUZE ELAST 4 BULKY (GAUZE/BANDAGES/DRESSINGS) ×3 IMPLANT
CANISTER SUCT 3000ML PPV (MISCELLANEOUS) ×3 IMPLANT
CLIP LIGATING EXTRA MED SLVR (CLIP) IMPLANT
CLIP LIGATING EXTRA SM BLUE (MISCELLANEOUS) IMPLANT
COVER SURGICAL LIGHT HANDLE (MISCELLANEOUS) ×3 IMPLANT
DRAPE EXTREMITY T 121X128X90 (DRAPE) ×3 IMPLANT
DRAPE HALF SHEET 40X57 (DRAPES) IMPLANT
DRSG EMULSION OIL 3X3 NADH (GAUZE/BANDAGES/DRESSINGS) ×3 IMPLANT
ELECT REM PT RETURN 9FT ADLT (ELECTROSURGICAL) ×3
ELECTRODE REM PT RTRN 9FT ADLT (ELECTROSURGICAL) ×1 IMPLANT
GAUZE SPONGE 4X4 12PLY STRL (GAUZE/BANDAGES/DRESSINGS) ×3 IMPLANT
GAUZE SPONGE 4X4 12PLY STRL LF (GAUZE/BANDAGES/DRESSINGS) ×3 IMPLANT
GLOVE SS BIOGEL STRL SZ 7.5 (GLOVE) ×1 IMPLANT
GLOVE SUPERSENSE BIOGEL SZ 7.5 (GLOVE) ×2
GLOVE SURG SS PI 7.0 STRL IVOR (GLOVE) ×3 IMPLANT
GOWN STRL REUS W/ TWL LRG LVL3 (GOWN DISPOSABLE) ×1 IMPLANT
GOWN STRL REUS W/ TWL XL LVL3 (GOWN DISPOSABLE) ×1 IMPLANT
GOWN STRL REUS W/TWL LRG LVL3 (GOWN DISPOSABLE) ×2
GOWN STRL REUS W/TWL XL LVL3 (GOWN DISPOSABLE) ×2
KIT BASIN OR (CUSTOM PROCEDURE TRAY) ×3 IMPLANT
KIT ROOM TURNOVER OR (KITS) ×3 IMPLANT
NEEDLE 22X1 1/2 (OR ONLY) (NEEDLE) IMPLANT
NS IRRIG 1000ML POUR BTL (IV SOLUTION) ×3 IMPLANT
PACK GENERAL/GYN (CUSTOM PROCEDURE TRAY) ×3 IMPLANT
PAD ARMBOARD 7.5X6 YLW CONV (MISCELLANEOUS) ×6 IMPLANT
SPECIMEN JAR SMALL (MISCELLANEOUS) IMPLANT
SUT ETHILON 3 0 PS 1 (SUTURE) ×3 IMPLANT
SUT VIC AB 3-0 SH 27 (SUTURE)
SUT VIC AB 3-0 SH 27X BRD (SUTURE) IMPLANT
SYR CONTROL 10ML LL (SYRINGE) IMPLANT
TOWEL GREEN STERILE (TOWEL DISPOSABLE) ×3 IMPLANT
UNDERPAD 30X30 (UNDERPADS AND DIAPERS) ×3 IMPLANT
WATER STERILE IRR 1000ML POUR (IV SOLUTION) ×3 IMPLANT

## 2016-10-20 NOTE — OR Nursing (Signed)
Three gold colored rings removed by CRNA placed in denture cup, tagged with patient sticker, transferred to PACU Nurse in bay 1 at case end.
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
## 2016-10-20 NOTE — Progress Notes (Signed)
Patient is refusing lab draws, heparin injection. Will attempt later

## 2016-10-20 NOTE — Anesthesia Postprocedure Evaluation (Signed)
Anesthesia Post Note  Patient: Ronald Simpson  Procedure(s) Performed: Procedure(s) (LRB): LEFT THIRD TOE  AMPUTATION (Left)     Patient location during evaluation: PACU Anesthesia Type: General Level of consciousness: oriented and awake Pain management: pain level controlled Vital Signs Assessment: post-procedure vital signs reviewed and stable Respiratory status: spontaneous breathing, nonlabored ventilation, respiratory function stable and patient connected to nasal cannula oxygen Cardiovascular status: blood pressure returned to baseline and stable Postop Assessment: no signs of nausea or vomiting Anesthetic complications: no    Last Vitals:  Vitals:   10/20/16 0855 10/20/16 0900  BP: 104/66   Pulse: 61   Resp: 13   Temp:  (!) 36.3 C    Last Pain:  Vitals:   10/20/16 0309  TempSrc: Oral  PainSc:                  Johnel Yielding,JAMES TERRILL

## 2016-10-20 NOTE — Anesthesia Procedure Notes (Signed)
Procedure Name: LMA Insertion Date/Time: 10/20/2016 8:01 AM Performed by: Rise PatienceBELL, Eliyanah Elgersma T Pre-anesthesia Checklist: Patient identified, Emergency Drugs available, Suction available and Patient being monitored Patient Re-evaluated:Patient Re-evaluated prior to inductionOxygen Delivery Method: Circle System Utilized Preoxygenation: Pre-oxygenation with 100% oxygen Intubation Type: IV induction LMA: LMA inserted LMA Size: 4.0 Number of attempts: 1 Airway Equipment and Method: Bite block Placement Confirmation: positive ETCO2 Tube secured with: Tape Dental Injury: Teeth and Oropharynx as per pre-operative assessment

## 2016-10-20 NOTE — Care Management Note (Signed)
Case Management Note  Patient Details  Name: Ronald Simpson MRN: [id number] Date of Birth: 1945/06/07  Subjective/Objective:    [facility], presents with sepsis, aki, severe hyperkalemia, pvd with gangrenous left foot, chronic chf,  Severe protein calorie malnutrition, indpendent dm, pafib  And uti.              Action/Plan: NCM will follow along with CSW for dc needs.    Expected Discharge Date:                  Expected Discharge Plan:     In-House Referral:     Discharge planning Services  CM Consult  Post Acute Care Choice:    Choice offered to:     DME Arranged:    DME Agency:     HH Arranged:    HH Agency:     Status of Service:  In process, will continue to follow  If discussed at Long Length of Stay Meetings, dates discussed:    Additional Comments:  Leone Havenaylor, Tehya Leath Clinton, RN 10/20/2016, 4:41 PM

## 2016-10-20 NOTE — Anesthesia Preprocedure Evaluation (Addendum)
Anesthesia Evaluation  Patient identified by MRN, date of birth, ID band Patient awake    Reviewed: Allergy & Precautions, NPO status , Patient's Chart, lab work & pertinent test results  History of Anesthesia Complications (+) Emergence Delirium  Airway Mallampati: II  TM Distance: >3 FB Neck ROM: Full    Dental  (+) Edentulous Upper, Edentulous Lower   Pulmonary former smoker,    breath sounds clear to auscultation       Cardiovascular hypertension, + CAD, + Peripheral Vascular Disease, +CHF and + DOE  + dysrhythmias  Rhythm:Regular Rate:Normal     Neuro/Psych    GI/Hepatic (+) Cirrhosis       , Hepatitis -  Endo/Other  diabetes, Poorly Controlled  Renal/GU Renal disease     Musculoskeletal   Abdominal   Peds  Hematology  (+) anemia ,   Anesthesia Other Findings   Reproductive/Obstetrics                           Anesthesia Physical Anesthesia Plan  ASA: IV  Anesthesia Plan: General   Post-op Pain Management:    Induction: Intravenous  PONV Risk Score and Plan: 3 and Ondansetron, Dexamethasone, Propofol and Midazolam  Airway Management Planned: LMA  Additional Equipment:   Intra-op Plan:   Post-operative Plan: Extubation in OR  Informed Consent: I have reviewed the patients History and Physical, chart, labs and discussed the procedure including the risks, benefits and alternatives for the proposed anesthesia with the patient or authorized representative who has indicated his/her understanding and acceptance.     Plan Discussed with: CRNA  Anesthesia Plan Comments: (Patient not sure why he is here today)       Anesthesia Quick Evaluation

## 2016-10-20 NOTE — Progress Notes (Signed)
Returning 3 gold colored rings to pt

## 2016-10-20 NOTE — H&P (View-Only) (Signed)
[facility]  Subjective  - No new complaints.   Objective 104/73 71 98.1 F (36.7 C) (Oral) 12 100%  Intake/Output Summary (Last 24 hours) at 10/19/16 16100822 Last data filed at 10/19/16 0414  Gross per 24 hour  Intake              700 ml  Output              975 ml  Net             -275 ml   Left third toe wound with gangrene, dry dressing in place Heart RRR Lungs non labored breathing   Assessment/Planning: Admitted with sepsis and electrolyte derangements.  Renal failure has worsened.  Pt has new gangrenous toe and non healing left foot wound but does not appear to have abscess Possible plan for third toe amputation, healing issue due to sever PAD.  Pt has unreconstructable tibial disease. Hydrogel to left foot wound once daily WBC decreased now 8.6 Cr increased to 2.58, UO total daily 975 CKD IV antibiotics Zosyn and Vanco   COLLINS, EMMA [name] 10/19/2016 8:22 AM -- Pt currently skeptical about further toe amp. Will make Dr Early aware of pt admission.  He will see tomorrow Continue current care as above  Fabienne Brunsharles Fields, MD [facility] Office: [phone] Pager: [phone]  Laboratory Lab Results:  Recent Labs  10/17/16 1618 10/18/16 0321  WBC 11.1* 8.6  HGB 15.7 13.7  HCT 48.2 43.1  PLT 245 202   BMET  Recent Labs  10/18/16 1926 10/19/16 0342  NA 133* 135  K 4.7 4.5  4.6  CL 105 106  CO2 18* 21*  GLUCOSE 97 88  BUN 75* 77*  CREATININE 2.50* 2.58*  CALCIUM 8.4* 8.8*    COAG Lab Results  Component Value Date   INR 1.04 10/17/2016   INR 1.57 08/27/2016   INR 1.26 08/02/2016   PROTIME 48.3 (A) 07/18/2016   PROTIME 62.4 (A) 07/15/2016   PROTIME 31.5 (A) 07/10/2016   No results found for: PTT

## 2016-10-20 NOTE — Transfer of Care (Signed)
Immediate Anesthesia Transfer of Care Note  Patient: Ronald Simpson  Procedure(s) Performed: Procedure(s): LEFT THIRD TOE  AMPUTATION (Left)  Patient Location: PACU  Anesthesia Type:General  Level of Consciousness: awake and alert   Airway & Oxygen Therapy: Patient Spontanous Breathing and Patient connected to nasal cannula oxygen  Post-op Assessment: Report given to RN, Post -op Vital signs reviewed and stable and Patient moving all extremities  Post vital signs: Reviewed and stable  Last Vitals:  Vitals:   10/19/16 2246 10/20/16 0309  BP: 132/72 123/74  Pulse: 79 80  Resp: 19 (!) 32  Temp: 36.7 C 36.8 C    Last Pain:  Vitals:   10/20/16 0309  TempSrc: Oral  PainSc:       Patients Stated Pain Goal: 0 (10/19/16 2004)  Complications: No apparent anesthesia complications

## 2016-10-20 NOTE — Progress Notes (Signed)
Pharmacy Antibiotic Note  Ronald Simpson is a 71 y.o. male admitted on 10/17/2016 with cellulitis.  Pharmacy has been consulted for vancomycin and zosyn dosing.   Renal function continues to improve, SCr 1.77, CrCl~30-40 ml/min. Will adjust Vancomycin dose today.    Plan: 1. Increase Vancomycin to 1g IV every 24 hours 2. Continue Zosyn 3.375g IV every 8 hours (infused over 4 hours) 3. Will continue to follow renal function, culture results, LOT, and antibiotic de-escalation plans   Height: 5\' 11"  (180.3 cm) Weight: 137 lb 12.6 oz (62.5 kg) IBW/kg (Calculated) : 75.3  Temp (24hrs), Avg:97.8 F (36.6 C), Min:97.3 F (36.3 C), Max:98.3 F (36.8 C)   Recent Labs Lab 10/17/16 1618 10/17/16 1630 10/17/16 2117 10/17/16 2342 10/18/16 0321 10/18/16 1926 10/19/16 0342 10/20/16 0318  WBC 11.1*  --   --   --  8.6  --   --   --   CREATININE 3.53*  --   --   --  2.93* 2.50* 2.58* 1.77*  LATICACIDVEN  --  2.68* 3.0* 2.0*  --   --   --   --     Estimated Creatinine Clearance: 35.3 mL/min (A) (by C-G formula based on SCr of 1.77 mg/dL (H)).    Allergies  Allergen Reactions  . Hydralazine Other (See Comments)    4/21-4/26/18 pleuritic chest pain with bilateral effusions and pericardial rub. Rule out hydralazine drug-induced lupus   . Percocet [Oxycodone-Acetaminophen] Other (See Comments)    Needing narcan on multiple occasions due to oversedation after getting percocet   . Tramadol Other (See Comments)    Excess sedation; ? Related to pre-existing hepatic dysfunction with PMH Hep C    Antimicrobials this admission: Vanc 7/6>> Zosyn 7/6>>  Dose adjustments this admission: N/A  Microbiology results: 7/6 BCx >> 1/2 CoNS 7/6 MRSA PCR >> positive  Thank you for allowing pharmacy to be a part of this patient's care.  Georgina PillionElizabeth Matilde Pottenger, PharmD, BCPS Clinical Pharmacist Clinical phone for 10/20/2016 from 7a-3:30p: [phone] If after 3:30p, please call main pharmacy at: [phone] 10/20/2016  1:40 PM

## 2016-10-20 NOTE — Op Note (Signed)
    OPERATIVE REPORT  DATE OF SURGERY: 10/20/2016  PATIENT: Ronald Simpson, 71 y.o. male MRN: [id number]  DOB: [birth year]  PRE-OPERATIVE DIAGNOSIS: Gangrene left third toe  POST-OPERATIVE DIAGNOSIS:  Same  PROCEDURE: Left third toe amputation and metatarsal head amputation  SURGEON:  Gretta Beganodd Tristine Langi, M.D.  PHYSICIAN ASSISTANT: Nurse  ANESTHESIA:  Gen.  EBL: Minimal ml  Total I/O In: -  Out: 10 [Blood:10]  BLOOD ADMINISTERED: None  DRAINS: None  SPECIMEN: None  COUNTS CORRECT:  YES  PLAN OF CARE: PACU   PATIENT DISPOSITION:  PACU - hemodynamically stable  PROCEDURE DETAILS: The patient had a prior fourth and fifth toe amputation. Does have a palpable popliteal pulse and arteriogram which showed single-vessel peroneal runoff. He has had the continued overall deterioration with malnutrition and renal insufficiency. Admitted with hyperkalemia and possible sepsis. Did not appear to have any involvement into his foot. His third toe is frankly dead. He had healed his fourth and fifth toe amputation sites. I recommended that amputation. Explained that he may continue to have involvement of his second and even great toe and may end up with a transmitted amputation.  Technical the operating room and after general anesthesia the left foot was prepped and draped in sterile fashion. The third toe was amputated at the base. The fourth and fifth amputation sites are completely healed. There was obvious involvement of the metatarsal head and this was debrided with the Roger back to healthy tissue. There was no involvement of the second metatarsal head. Was irrigated with saline and hemostasis obtained left cautery. The wound was packed with a Betadine soaked 4 x 4 and Kerlix was placed over this. The patient was transferred to the recovery room in stable condition   Ronald Simpson, M.D., [facility] 10/20/2016 8:51 AM

## 2016-10-20 NOTE — Clinical Social Work Note (Signed)
CSW acknowledges consult "Triggered from admission." Please consult again if any social work needs arise.  CSW signing off.  Charlynn CourtSarah Elice Crigger, CSW [phone]

## 2016-10-20 NOTE — Interval H&P Note (Signed)
History and Physical Interval Note:  10/20/2016 8:07 AM  Ronald Simpson  has presented today for surgery, with the diagnosis of Diabetic Vascular Disease Left Third Toe   The various methods of treatment have been discussed with the patient and family. After consideration of risks, benefits and other options for treatment, the patient has consented to  Procedure(s): LEFT THIRD TOE  AMPUTATION (Left) as a surgical intervention .  The patient's history has been reviewed, patient examined, no change in status, stable for surgery.  I have reviewed the patient's chart and labs.  Questions were answered to the patient's satisfaction.     Gretta BeganEarly, Denzel Etienne

## 2016-10-21 ENCOUNTER — Encounter (HOSPITAL_COMMUNITY): Payer: Self-pay | Admitting: Vascular Surgery

## 2016-10-21 LAB — GLUCOSE, CAPILLARY
GLUCOSE-CAPILLARY: 131 mg/dL — AB (ref 65–99)
GLUCOSE-CAPILLARY: 92 mg/dL (ref 65–99)
GLUCOSE-CAPILLARY: 114 mg/dL — AB (ref 65–99)
GLUCOSE-CAPILLARY: 125 mg/dL — AB (ref 65–99)
Glucose-Capillary: 111 mg/dL — ABNORMAL HIGH (ref 65–99)

## 2016-10-21 LAB — COMPREHENSIVE METABOLIC PANEL
ALBUMIN: 2 g/dL — AB (ref 3.5–5.0)
ALK PHOS: 134 U/L — AB (ref 38–126)
ALT: 10 U/L — AB (ref 17–63)
ANION GAP: 6 (ref 5–15)
AST: 13 U/L — ABNORMAL LOW (ref 15–41)
BUN: 43 mg/dL — ABNORMAL HIGH (ref 6–20)
CALCIUM: 8.1 mg/dL — AB (ref 8.9–10.3)
CHLORIDE: 110 mmol/L (ref 101–111)
CO2: 20 mmol/L — AB (ref 22–32)
CREATININE: 1.35 mg/dL — AB (ref 0.61–1.24)
GFR calc non Af Amer: 52 mL/min — ABNORMAL LOW (ref >60)
GLUCOSE: 101 mg/dL — AB (ref 65–99)
Potassium: 4.2 mmol/L (ref 3.5–5.1)
SODIUM: 136 mmol/L (ref 135–145)
Total Bilirubin: 0.7 mg/dL (ref 0.3–1.2)
Total Protein: 6.2 g/dL — ABNORMAL LOW (ref 6.5–8.1)

## 2016-10-21 LAB — HEMOGLOBIN A1C
Hgb A1c MFr Bld: 7.4 % — ABNORMAL HIGH (ref 4.8–5.6)
MEAN PLASMA GLUCOSE: 166 mg/dL

## 2016-10-21 LAB — CBC
HCT: 33.5 % — ABNORMAL LOW (ref 39.0–52.0)
HEMOGLOBIN: 10.4 g/dL — AB (ref 13.0–17.0)
MCH: 27.3 pg (ref 26.0–34.0)
MCHC: 31 g/dL (ref 30.0–36.0)
MCV: 87.9 fL (ref 78.0–100.0)
PLATELETS: 233 10*3/uL (ref 150–400)
RBC: 3.81 MIL/uL — AB (ref 4.22–5.81)
RDW: 19.6 % — ABNORMAL HIGH (ref 11.5–15.5)
WBC: 9.8 10*3/uL (ref 4.0–10.5)

## 2016-10-21 MED ORDER — FENTANYL CITRATE (PF) 100 MCG/2ML IJ SOLN
25.0000 ug | INTRAMUSCULAR | Status: DC | PRN
  Administered 2016-10-21 (×3): 25 ug via INTRAVENOUS
  Administered 2016-10-22 (×2): 50 ug via INTRAVENOUS
  Administered 2016-10-22 (×4): 25 ug via INTRAVENOUS
  Administered 2016-10-23 (×4): 50 ug via INTRAVENOUS
  Filled 2016-10-21 (×14): qty 2

## 2016-10-21 MED ORDER — VANCOMYCIN HCL 500 MG IV SOLR
500.0000 mg | Freq: Two times a day (BID) | INTRAVENOUS | Status: DC
  Administered 2016-10-21 – 2016-10-23 (×4): 500 mg via INTRAVENOUS
  Filled 2016-10-21 (×5): qty 500
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
## 2016-10-21 NOTE — Care Management Important Message (Signed)
Important Message  Patient Details  Name: Ronald Simpson MRN: [id number] Date of Birth: October 18, 1945   Medicare Important Message Given:  Yes    Leone Havenaylor, Kamilah Correia Clinton, RN 10/21/2016, 10:25 AMImportant Message  Patient Details  Name: Ronald Simpson MRN: [id number] Date of Birth: October 18, 1945   Medicare Important Message Given:  Yes    Leone Havenaylor, Emmanuel Ercole Clinton, RN 10/21/2016, 10:25 AM

## 2016-10-21 NOTE — Progress Notes (Addendum)
  Progress Note    10/21/2016 7:45 AM 1 Day Post-Op  Subjective:  Nervous about dressing change  Tm 99.4 now afebrile HR  60's-80's   Vitals:   10/20/16 2326 10/21/16 0400  BP:  123/71  Pulse: 88 70  Resp: 18 (!) 9  Temp:  98.2 F (36.8 C)    Physical Exam: Lungs:  Non labored Incisions:  Wound appears dusky with minimal bleeding   CBC    Component Value Date/Time   WBC 9.8 10/21/2016 0306   RBC 3.81 (L) 10/21/2016 0306   HGB 10.4 (L) 10/21/2016 0306   HCT 33.5 (L) 10/21/2016 0306   PLT 233 10/21/2016 0306   MCV 87.9 10/21/2016 0306   MCH 27.3 10/21/2016 0306   MCHC 31.0 10/21/2016 0306   RDW 19.6 (H) 10/21/2016 0306   LYMPHSABS 1.6 10/18/2016 0321   MONOABS 0.7 10/18/2016 0321   EOSABS 0.1 10/18/2016 0321   BASOSABS 0.0 10/18/2016 0321    BMET    Component Value Date/Time   NA 136 10/21/2016 0306   NA 132 (A) 09/30/2016   K 4.2 10/21/2016 0306   CL 110 10/21/2016 0306   CO2 20 (L) 10/21/2016 0306   GLUCOSE 101 (H) 10/21/2016 0306   BUN 43 (H) 10/21/2016 0306   BUN 52 (A) 09/30/2016   CREATININE 1.35 (H) 10/21/2016 0306   CALCIUM 8.1 (L) 10/21/2016 0306   GFRNONAA 52 (L) 10/21/2016 0306   GFRAA >60 10/21/2016 0306    INR    Component Value Date/Time   INR 1.04 10/17/2016 1618     Intake/Output Summary (Last 24 hours) at 10/21/16 0745 Last data filed at 10/21/16 0600  Gross per 24 hour  Intake             3135 ml  Output              485 ml  Net             2650 ml     Assessment:  71 y.o. male is s/p:  Left third toe amputation and metatarsal head amputation  1 Day Post-Op  Plan: -pt's dressing changed; wound dusky with minimal bleeding -4x4 placed between two toes and wet to dry dressing placed.   -will have to monitor wound -dressing changes bid    Doreatha MassedSamantha Ascher Schroepfer, PA-C [facility] [phone] 10/21/2016 7:45 AM   ADDENDUM:  Discussed IV ABx with Dr. Arbie CookeyEarly.  Pt should remain on IV abx for another week.   If he is discharged, he may be converted to po for a total of another 2 weeks.  Doreatha MassedSamantha Sophea Rackham, [facility] 10/21/2016. Now

## 2016-10-21 NOTE — Progress Notes (Signed)
Pharmacy Antibiotic Note  Ronald Simpson is a 71 y.o. male admitted on 10/17/2016 with cellulitis.  Pharmacy has been consulted for vancomycin and zosyn dosing.   Renal function continues to improve, SCr 1.35 << 1.77, CrCl~40-50 ml/min. Will adjust Vancomycin dose today.   Called to discuss antibiotic LOT with [facility] today since appears per OR report that the wound was debrided to healthy tissue. [facility]-PA will discuss with [facility]-MD and plans to address.   Plan: 1. Increase Vancomycin to 500 mg IV every 12 hours 2. Continue Zosyn 3.375g IV every 8 hours (infused over 4 hours) 3. Will continue to follow renal function, culture results, LOT, and antibiotic de-escalation plans   Height: 5\' 11"  (180.3 cm) Weight: 139 lb 5.3 oz (63.2 kg) IBW/kg (Calculated) : 75.3  Temp (24hrs), Avg:98.5 F (36.9 C), Min:97.9 F (36.6 C), Max:99.4 F (37.4 C)   Recent Labs Lab 10/17/16 1618 10/17/16 1630 10/17/16 2117 10/17/16 2342 10/18/16 0321 10/18/16 1926 10/19/16 0342 10/20/16 0318 10/21/16 0306  WBC 11.1*  --   --   --  8.6  --   --   --  9.8  CREATININE 3.53*  --   --   --  2.93* 2.50* 2.58* 1.77* 1.35*  LATICACIDVEN  --  2.68* 3.0* 2.0*  --   --   --   --   --     Estimated Creatinine Clearance: 46.8 mL/min (A) (by C-G formula based on SCr of 1.35 mg/dL (H)).    Allergies  Allergen Reactions  . Hydralazine Other (See Comments)    4/21-4/26/18 pleuritic chest pain with bilateral effusions and pericardial rub. Rule out hydralazine drug-induced lupus   . Percocet [Oxycodone-Acetaminophen] Other (See Comments)    Needing narcan on multiple occasions due to oversedation after getting percocet   . Tramadol Other (See Comments)    Excess sedation; ? Related to pre-existing hepatic dysfunction with PMH Hep C    Antimicrobials this admission: Vanc 7/6>> Zosyn 7/6>>  Dose adjustments this admission: N/A  Microbiology results: 7/6 BCx >> 1/2 CoNS 7/6 MRSA PCR >> positive  Thank you for  allowing pharmacy to be a part of this patient's care.  Georgina PillionElizabeth Elizibeth Breau, PharmD, BCPS Clinical Pharmacist Clinical phone for 10/21/2016 from 7a-3:30p: [phone] If after 3:30p, please call main pharmacy at: [phone] 10/21/2016 11:48 AM

## 2016-10-22 ENCOUNTER — Encounter: Payer: Self-pay | Admitting: Vascular Surgery

## 2016-10-22 ENCOUNTER — Telehealth: Payer: Self-pay | Admitting: Vascular Surgery

## 2016-10-22 DIAGNOSIS — I2510 Atherosclerotic heart disease of native coronary artery without angina pectoris: Secondary | ICD-10-CM

## 2016-10-22 LAB — CULTURE, BLOOD (ROUTINE X 2)
CULTURE: NO GROWTH
SPECIAL REQUESTS: ADEQUATE

## 2016-10-22 LAB — GLUCOSE, CAPILLARY
GLUCOSE-CAPILLARY: 153 mg/dL — AB (ref 65–99)
GLUCOSE-CAPILLARY: 166 mg/dL — AB (ref 65–99)
GLUCOSE-CAPILLARY: 190 mg/dL — AB (ref 65–99)
GLUCOSE-CAPILLARY: 132 mg/dL — AB (ref 65–99)
Glucose-Capillary: 81 mg/dL (ref 65–99)

## 2016-10-22 MED ORDER — CARVEDILOL 6.25 MG PO TABS
6.2500 mg | ORAL_TABLET | Freq: Two times a day (BID) | ORAL | Status: DC
  Administered 2016-10-22 – 2016-10-23 (×2): 6.25 mg via ORAL
  Filled 2016-10-22 (×2): qty 1

## 2016-10-22 NOTE — Progress Notes (Addendum)
Progress Note  SUBJECTIVE:    POD #2  Was sleeping comfortably. Awakes easily.   OBJECTIVE:   Vitals:   10/22/16 0328 10/22/16 0600  BP: (!) 177/88   Pulse: 94 89  Resp: 17 19  Temp: 98.3 F (36.8 C)     Intake/Output Summary (Last 24 hours) at 10/22/16 0725 Last data filed at 10/21/16 1500  Gross per 24 hour  Intake             1025 ml  Output               25 ml  Net             1000 ml   Left foot dressing clean. Left foot warm.   ASSESSMENT/PLAN:   71 y.o. 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 Will return later after pain medication administration.  Continue IV abx for another week. If discharged, change to po abx for a total of 2 weeks.    Ronald Simpson 10/22/2016 7:25 AM   Addendum  Dressing changed. Wound bed is clean with pink granulation tissue.   Maris Berger, PA-C -- LABS:   CBC    Component Value Date/Time   WBC 9.8 10/21/2016 0306   HGB 10.4 (L) 10/21/2016 0306   HCT 33.5 (L) 10/21/2016 0306   PLT 233 10/21/2016 0306    BMET    Component Value Date/Time   NA 136 10/21/2016 0306   NA 132 (A) 09/30/2016   K 4.2 10/21/2016 0306   CL 110 10/21/2016 0306   CO2 20 (L) 10/21/2016 0306   GLUCOSE 101 (H) 10/21/2016 0306   BUN 43 (H) 10/21/2016 0306   BUN 52 (A) 09/30/2016   CREATININE 1.35 (H) 10/21/2016 0306   CALCIUM 8.1 (L) 10/21/2016 0306   GFRNONAA 52 (L) 10/21/2016 0306   GFRAA >60 10/21/2016 0306    COAG Lab Results  Component Value Date   INR 1.04 10/17/2016   INR 1.57 08/27/2016   INR 1.26 08/02/2016   PROTIME 48.3 (A) 07/18/2016   PROTIME 62.4 (A) 07/15/2016   PROTIME 31.5 (A) 07/10/2016   No results found for: PTT  ANTIBIOTICS:   Anti-infectives    Start     Dose/Rate Route Frequency Ordered Stop   10/21/16 1800  vancomycin (VANCOCIN) 500 mg in sodium chloride 0.9 % 100 mL IVPB     500 mg 100 mL/hr over 60  Minutes Intravenous Every 12 hours 10/21/16 1150     10/20/16 2000  vancomycin (VANCOCIN) IVPB 1000 mg/200 mL premix  Status:  Discontinued     1,000 mg 200 mL/hr over 60 Minutes Intravenous Every 24 hours 10/20/16 1340 10/21/16 1150   10/20/16 0745  piperacillin-tazobactam (ZOSYN) IVPB 3.375 g     3.375 g 100 mL/hr over 30 Minutes Intravenous To Surgery 10/20/16 0741 10/20/16 0835   10/19/16 2000  vancomycin (VANCOCIN) IVPB 750 mg/150 ml premix  Status:  Discontinued     750 mg 150 mL/hr over 60 Minutes Intravenous Every 48 hours 10/17/16 2009 10/19/16 1210   10/19/16 2000  vancomycin (VANCOCIN) IVPB 750 mg/150 ml premix  Status:  Discontinued     750 mg 150 mL/hr over 60 Minutes Intravenous Every 24 hours 10/19/16 1210 10/20/16 1340   10/19/16 1700  piperacillin-tazobactam (ZOSYN) IVPB 3.375 g  Status:  Discontinued     3.375 g 12.5 mL/hr over 240  Minutes Intravenous Every 8 hours 10/19/16 1211 10/21/16 0945   10/18/16 0200  piperacillin-tazobactam (ZOSYN) IVPB 2.25 g  Status:  Discontinued     2.25 g 100 mL/hr over 30 Minutes Intravenous Every 8 hours 10/17/16 2009 10/19/16 1210   10/17/16 1930  piperacillin-tazobactam (ZOSYN) IVPB 3.375 g     3.375 g 100 mL/hr over 30 Minutes Intravenous  Once 10/17/16 1929 10/17/16 2037   10/17/16 1930  vancomycin (VANCOCIN) IVPB 1000 mg/200 mL premix     1,000 mg 200 mL/hr over 60 Minutes Intravenous  Once 10/17/16 1929 10/17/16 2107       Maris BergerKimberly Takashi Korol, PA-C [facility] Office: [phone] Pager: [phone] 10/22/2016 7:25 AM

## 2016-10-22 NOTE — Evaluation (Signed)
Occupational Therapy Evaluation Patient Details Name: Ronald Simpson MRN: [id number] DOB: 09-28-1945 Today's Date: 10/22/2016    History of Present Illness This 71 y.o. male admitted from MD office with Lt foot pain an lightheadedness with hypotension.  Dx:  hyperkalemia; Lt foot gangrene with possible sepsis, AKI.  He underwent Third toe amputation 10/20/16.  PMH includes:  substance abuse, IDDM, Hep C cirrhosis, Paroxysmal A-FIb, peripheral neuropathy, s/p Rt AKA, Lt fourth and fifth toe amputation.    Clinical Impression   Pt admitted with above.  See below for deficits and current status.   He participated minimally during eval, and refused EOB or OOB activity citing pain.   Reinforced need and discussed secondary effects of immobility with no change in participation.   He will benefit from SNF level rehab at discharge.      Follow Up Recommendations  SNF    Equipment Recommendations  None recommended by OT    Recommendations for Other Services       Precautions / Restrictions Precautions Precautions: Fall      Mobility Bed Mobility               General bed mobility comments: pt adamantly refused   Transfers Overall transfer level: Needs assistance               General transfer comment: Pt adamantly refused     Balance                                           ADL either performed or assessed with clinical judgement   ADL Overall ADL's : Needs assistance/impaired Eating/Feeding: Modified independent;Bed level   Grooming: Wash/dry hands;Wash/dry face;Oral care;Brushing hair;Set up;Bed level   Upper Body Bathing: Minimal assistance;Bed level   Lower Body Bathing: Total assistance;Bed level   Upper Body Dressing : Maximal assistance;Bed level   Lower Body Dressing: Total assistance;Bed level   Toilet Transfer: Total assistance Toilet Transfer Details (indicate cue type and reason): Pt refused  Toileting- Clothing Manipulation  and Hygiene: Total assistance;Bed level         General ADL Comments: All info above simulated as pt refused to move.       Vision         Perception     Praxis      Pertinent Vitals/Pain Pain Assessment: 0-10 Pain Score: 8  Pain Location: Lt foot  Pain Descriptors / Indicators: Aching Pain Intervention(s): Limited activity within patient's tolerance;Premedicated before session;Patient requesting pain meds-RN notified     Hand Dominance Right   Extremity/Trunk Assessment Upper Extremity Assessment Upper Extremity Assessment: Generalized weakness   Lower Extremity Assessment Lower Extremity Assessment: Defer to PT evaluation       Communication Communication Communication: No difficulties   Cognition Arousal/Alertness: Awake/alert Behavior During Therapy: WFL for tasks assessed/performed Overall Cognitive Status: Within Functional Limits for tasks assessed                                 General Comments: Pt irritable at times and evasive with questions, but more interactive at end of session    General Comments  Pt states he needs at least 4-5 days to feel better and strong enough to move to EOB, or work with therapies.  Explained risks associated with immobility including increased risk of infection,  PNA, delayed wound healing, pressure sores, etc.  Despite this and encouragement, pt continued to refuse activity.  He did thank therapist for trying stating "I know you are just trying to help me, but I got a lot in my head I have to work through".       Exercises     Shoulder Instructions      Home Living Family/patient expects to be discharged to:: Skilled nursing facility                                 Additional Comments: Pt reports he has resided in NH for ~ 1 year.  Prior that, he lived with wife, who is now deceased       Prior Functioning/Environment Level of Independence: Needs assistance  Gait / Transfers Assistance  Needed: Pt reports he has been transferring into w/c mod I ADL's / Homemaking Assistance Needed: Pt very vague about his current functional status, making it difficult to determine    Comments: Pt indicates he was not receiving therapies at SNF because of the Lt foot, but he is very evasive with answers, making it difficult to accurately determine his baseline status         OT Problem List: Decreased strength;Decreased activity tolerance;Impaired balance (sitting and/or standing);Decreased safety awareness;Decreased knowledge of use of DME or AE;Pain      OT Treatment/Interventions:      OT Goals(Current goals can be found in the care plan section) Acute Rehab OT Goals Patient Stated Goal: To have less pain  OT Goal Formulation: All assessment and education complete, DC therapy  OT Frequency:     Barriers to D/C:            Co-evaluation              AM-PAC PT "6 Clicks" Daily Activity     Outcome Measure Help from another person eating meals?: None Help from another person taking care of personal grooming?: A Little Help from another person toileting, which includes using toliet, bedpan, or urinal?: Total Help from another person bathing (including washing, rinsing, drying)?: A Lot Help from another person to put on and taking off regular upper body clothing?: A Lot Help from another person to put on and taking off regular lower body clothing?: Total 6 Click Score: 13   End of Session Nurse Communication: Other (comment) (activity status )  Activity Tolerance: Patient limited by pain Patient left: in bed;with call bell/phone within reach  OT Visit Diagnosis: Pain Pain - Right/Left: Left Pain - part of body: Leg                Time: 1111-1131 OT Time Calculation (min): 20 min Charges:  OT General Charges $OT Visit: 1 Procedure OT Evaluation $OT Eval Moderate Complexity: 1 Procedure G-Codes:     Jeani HawkingWendi Callaghan Laverdure, OTR/L [phone]   Virgina Organonarpe, Jena Tegeler M 10/22/2016,  2:24 PM

## 2016-10-22 NOTE — Clinical Social Work Note (Signed)
Clinical Social Work Assessment  Patient Details  Name: Ronald Simpson MRN: [id number] Date of Birth: Sep 23, 1945  Date of referral:  10/22/16               Reason for consult:  Facility Placement, Discharge Planning                Permission sought to share information with:  Chartered certified accountant granted to share information::  Yes, Verbal Permission Granted  Name::        Agency::  [facility]  Relationship::     Contact Information:     Housing/Transportation Living arrangements for the past 2 months:  [place] of Information:  Patient, Scientist, water quality, Facility Patient Interpreter Needed:  None Criminal Activity/Legal Involvement Pertinent to Current Situation/Hospitalization:  No - Comment as needed Significant Relationships:  Adult Children, Parents, Other Family Members Lives with:  Facility Resident Do you feel safe going back to the place where you live?  Yes Need for family participation in patient care:  No (Coment)  Care giving concerns:  Patient is a long-term resident at [facility].   Social Worker assessment / plan:  CSW met with patient. No supports at bedside. CSW introduced role and explained that discharge planning would be discussed. Patient confirmed that he was admitted from [facility] and plans to return when stable for discharge. Patient states he has been there about a year. Per past CSW notes, patient was initially discharged there October 2017. Admissions coordinator confirmed he is considered a long-term resident. Patient asking when he can get his pain medication. RN aware and wrote on the board for patient to see the time for his next doses. No further concerns. CSW encouraged patient to contact CSW as needed. CSW will continue to follow patient for support and facilitate discharge back to SNF once medically stable.  Employment status:  Retired Forensic scientist:  Medicare PT Recommendations:  Not assessed at  this time [place] / Referral to community resources:  [place]  Patient/Family's Response to care:  Patient agreeable to return to SNF. Patient's family supportive and involved in patient's care. Patient appreciated social work intervention.  Patient/Family's Understanding of and Emotional Response to Diagnosis, Current Treatment, and Prognosis:  Patient has a good understanding of the reason for admission and his need to return to SNF. He is focused on getting his pain medication. Per RN, patient has made frequent requests for it.  Emotional Assessment Appearance:  Appears stated age Attitude/Demeanor/Rapport:  Complaining Affect (typically observed):  Irritable Orientation:  Oriented to Self, Oriented to Place, Oriented to  Time, Oriented to Situation Alcohol / Substance use:  Never Used Psych involvement (Current and /or in the community):  No (Comment)  Discharge Needs  Concerns to be addressed:  Care Coordination Readmission within the last 30 days:  No Current discharge risk:  None Barriers to Discharge:  Continued Medical Work up   Candie Chroman, LCSW 10/22/2016, 1:53 PM

## 2016-10-22 NOTE — Telephone Encounter (Signed)
-----   Message from Sharee PimpleMarilyn K McChesney, RN sent at 10/22/2016  8:43 AM EDT ----- Regarding: 2 weeks   ----- Message ----- From: Raymond Gurneyrinh, Kimberly A, PA-C Sent: 10/22/2016   7:35 AM To: Vvs Charge Pool  S/p left 3rd toe amputation 10/20/16  F/u with Dr. Arbie CookeyEarly in 2 weeks.  Thanks Selena BattenKim

## 2016-10-22 NOTE — Telephone Encounter (Signed)
Sched appt 11/04/16 at 9:30. Spoke to pt.

## 2016-10-22 NOTE — NC FL2 (Signed)
[place] MEDICAID FL2 LEVEL OF CARE SCREENING TOOL     IDENTIFICATION  Patient Name: Ronald Simpson Birthdate: Apr 27, 1945 Sex: male Admission Date (Current Location): 10/17/2016  [facility] [place]   Facility and Address:  [facility], [address], [place], [place] [address]      Provider Number: [id number]  Attending Physician Name and Address:  Richarda OverlieAbrol, Nayana, MD  Relative Name and Phone Number:       Current Level of Care: Hospital Recommended Level of Care: Skilled Nursing Facility Prior Approval Number:    Date Approved/Denied:   PASRR Number: [id number]  Discharge Plan: SNF    Current Diagnoses: Patient Active Problem List   Diagnosis Date Noted  . Protein-calorie malnutrition, severe 10/19/2016  . Hyperkalemia 10/17/2016  . Delayed surgical wound healing of foot amputation stump (HCC)   . Hypotension   . Adult failure to thrive 09/16/2016  . Status post thoracentesis   . Cardiogenic shock (HCC)   . Respiratory failure with hypoxia (HCC) 08/27/2016  . Chronic systolic CHF (congestive heart failure) (HCC)   . Acute respiratory failure (HCC)   . Septic shock (HCC)   . Chronic bronchitis (HCC) 08/21/2016  . Positive ANA (antinuclear antibody) 08/21/2016  . Pericarditis 08/12/2016  . Acute on chronic combined systolic and diastolic CHF (congestive heart failure) (HCC) 08/05/2016  . Acute kidney injury superimposed on chronic kidney disease (HCC) 08/05/2016  . Chest pain 08/02/2016  . Pleuritic chest pain 08/02/2016  . Pleural effusion 08/02/2016  . Elevated brain natriuretic peptide (BNP) level 08/02/2016  . Chronic anemia 07/22/2016  . Supratherapeutic international normalized ratio (INR) 07/17/2016  . Ascites 06/27/2016  . Chronic anticoagulation   . Cirrhosis (HCC)   . Acute GI bleeding 05/15/2016  . Melanotic stools 05/15/2016  . Altered mental status 04/10/2016  . Gangrene of toe of left foot (HCC)    . Cystitis   . 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 At risk for adverse drug event 02/14/2016  . Peripheral neuropathy 01/31/2016  . Scrotal edema 01/15/2016  . Dry gangrene (HCC) 01/15/2016  . Renal insufficiency 01/15/2016  . PAD (peripheral artery disease) (HCC) 01/11/2016  . 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 Tobacco abuse   . Tachycardia   . Hyponatremia   . Hypoalbuminemia   . MRSA cellulitis of left foot   . Serratia marcescens infection (HCC)   . Escherichia coli infection   . CAD in native artery   . PAF (paroxysmal atrial fibrillation) (HCC)   . Uncontrolled type 2 diabetes mellitus with complication (HCC)   . Hepatitis C without hepatic coma 12/27/2015  . Diabetes mellitus with peripheral vascular disease (HCC) 12/27/2015  . History of CVA (cerebrovascular accident) 12/27/2015  . AKI (acute kidney injury) (HCC) 12/27/2015  . Hypothyroidism, acquired 12/27/2015  . Benign essential HTN 12/27/2015  . Sepsis (HCC) 12/26/2015    Orientation RESPIRATION BLADDER Height & Weight     Self, Time, Situation, Place  Normal Continent Weight: 139 lb 12.4 oz (63.4 kg) Height:  5\' 11"  (180.3 cm)  BEHAVIORAL SYMPTOMS/MOOD NEUROLOGICAL BOWEL NUTRITION STATUS     (History of CVA) Continent Diet (Heart healthy/carb modified)  AMBULATORY STATUS COMMUNICATION OF NEEDS Skin     Verbally Other (Comment), Surgical wounds (Amputation, MASD, Excoriated. Gangrene: Left medial toe.)  Personal Care Assistance Level of Assistance              Functional Limitations Info  Sight, Hearing, Speech Sight Info: Adequate Hearing Info: Adequate Speech Info: Adequate    SPECIAL CARE FACTORS FREQUENCY  Blood pressure                    Contractures Contractures Info: Not present    Additional Factors Info  Code Status, Allergies, Isolation Precautions Code Status  Info: Full Allergies Info: Hydralazine, Percoset (Oxycodone-acetaminophen), Tramadol     Isolation Precautions Info: Contact: MRSA     Current Medications (10/22/2016):  This is the current hospital active medication list Current Facility-Administered Medications  Medication Dose Route Frequency Provider Last Rate Last Dose  . 0.9 %  sodium chloride infusion   Intravenous Continuous Osei-Bonsu, Greggory Stallion, MD 75 mL/hr at 10/22/16 1144    . acetaminophen (TYLENOL) tablet 650 mg  650 mg Oral Q6H PRN Opyd, Lavone Neri, MD   650 mg at 10/20/16 1231   Or  . acetaminophen (TYLENOL) suppository 650 mg  650 mg Rectal Q6H PRN Opyd, Lavone Neri, MD      . barrier cream (non-specified) 1 application  1 application Topical BID Opyd, Lavone Neri, MD   1 application at 10/20/16 2200  . bisacodyl (DULCOLAX) EC tablet 5 mg  5 mg Oral Daily PRN Opyd, Lavone Neri, MD      . carvedilol (COREG) tablet 6.25 mg  6.25 mg Oral BID WC Abrol, Germain Osgood, MD      . Chlorhexidine Gluconate Cloth 2 % PADS 6 each  6 each Topical Q0600 Jackie Plum, MD   6 each at 10/22/16 0555  . digoxin (LANOXIN) tablet 0.125 mg  0.125 mg Oral Daily Opyd, Lavone Neri, MD   0.125 mg at 10/22/16 0949  . diphenhydrAMINE (BENADRYL) capsule 25 mg  25 mg Oral Q6H PRN Opyd, Lavone Neri, MD   25 mg at 10/17/16 2228  . feeding supplement (ENSURE ENLIVE) (ENSURE ENLIVE) liquid 237 mL  237 mL Oral TID BM Opyd, Lavone Neri, MD   237 mL at 10/22/16 1000  . fentaNYL (SUBLIMAZE) injection 25-50 mcg  25-50 mcg Intravenous Q3H PRN Richarda Overlie, MD   50 mcg at 10/22/16 0948  . heparin injection 5,000 Units  5,000 Units Subcutaneous Q8H Opyd, Lavone Neri, MD   5,000 Units at 10/22/16 0556  . HYDROcodone-acetaminophen (NORCO/VICODIN) 5-325 MG per tablet 1-2 tablet  1-2 tablet Oral Q4H PRN Opyd, Lavone Neri, MD   2 tablet at 10/22/16 1154  . insulin aspart (novoLOG) injection 0-5 Units  0-5 Units Subcutaneous QHS Briscoe Deutscher, MD   2 Units at 10/20/16 2337  . insulin aspart  (novoLOG) injection 0-9 Units  0-9 Units Subcutaneous TID WC Opyd, Lavone Neri, MD   2 Units at 10/22/16 1152  . levothyroxine (SYNTHROID, LEVOTHROID) tablet 25 mcg  25 mcg Oral QAC breakfast Opyd, Lavone Neri, MD   25 mcg at 10/22/16 [phone]  . multivitamin with minerals tablet 1 tablet  1 tablet Oral Daily Osei-Bonsu, Greggory Stallion, MD   1 tablet at 10/22/16 0949  . mupirocin ointment (BACTROBAN) 2 % 1 application  1 application Nasal BID Jackie Plum, MD   1 application at 10/22/16 0951  . ondansetron (ZOFRAN) tablet 4 mg  4 mg Oral Q6H PRN Opyd, Lavone Neri, MD       Or  . ondansetron (ZOFRAN) injection 4 mg  4 mg Intravenous Q6H PRN Opyd, Lavone Neri, MD      .  pantoprazole (PROTONIX) EC tablet 40 mg  40 mg Oral Daily Opyd, Lavone Neri, MD   40 mg at 10/22/16 0949  . polyethylene glycol (MIRALAX / GLYCOLAX) packet 17 g  17 g Oral Daily PRN Opyd, Lavone Neri, MD      . saccharomyces boulardii (FLORASTOR) capsule 250 mg  250 mg Oral BID Opyd, Lavone Neri, MD   250 mg at 10/22/16 0949  . sertraline (ZOLOFT) tablet 25 mg  25 mg Oral Daily Opyd, Lavone Neri, MD   25 mg at 10/22/16 0949  . sodium chloride flush (NS) 0.9 % injection 3 mL  3 mL Intravenous Q12H Opyd, Lavone Neri, MD   3 mL at 10/20/16 1047  . vancomycin (VANCOCIN) 500 mg in sodium chloride 0.9 % 100 mL IVPB  500 mg Intravenous Q12H Ann Held, RPH 100 mL/hr at 10/22/16 0556 500 mg at 10/22/16 1610     Discharge Medications: Please see discharge summary for a list of discharge medications.  Relevant Imaging Results:  Relevant Lab Results:   Additional Information SS#: [id number]  Margarito Liner, LCSW

## 2016-10-22 NOTE — Progress Notes (Signed)
PT Cancellation Note  Patient Details Name: Ronald Simpson MRN: [id number] DOB: [birth year]   Cancelled Treatment:    Reason Eval/Treat Not Completed: (P) Patient declined, no reason specified. Pt asked PT to leave because he had "a lot of stuff going on." Pt asked when PT could come back and pt replied "midnight tomorrow." Both PT and nursing staff tried to convince pt to participate.  PT will try to perform evaluation tomorrow.   Nicollette Wilhelmi B. Beverely RisenVan Simpson PT, DPT Acute Rehabilitation  [phone] Pager [phone]   Ronald Simpson 10/22/2016, 4:31 PM
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
## 2016-10-23 DIAGNOSIS — I5022 Chronic systolic (congestive) heart failure: Secondary | ICD-10-CM

## 2016-10-23 LAB — COMPREHENSIVE METABOLIC PANEL
ALBUMIN: 1.8 g/dL — AB (ref 3.5–5.0)
ALT: 10 U/L — ABNORMAL LOW (ref 17–63)
AST: 16 U/L (ref 15–41)
Alkaline Phosphatase: 152 U/L — ABNORMAL HIGH (ref 38–126)
Anion gap: 4 — ABNORMAL LOW (ref 5–15)
BUN: 20 mg/dL (ref 6–20)
CHLORIDE: 114 mmol/L — AB (ref 101–111)
CO2: 22 mmol/L (ref 22–32)
Calcium: 8.2 mg/dL — ABNORMAL LOW (ref 8.9–10.3)
Creatinine, Ser: 0.79 mg/dL (ref 0.61–1.24)
GFR calc Af Amer: >60 mL/min (ref >60)
GFR calc non Af Amer: >60 mL/min (ref >60)
GLUCOSE: 116 mg/dL — AB (ref 65–99)
POTASSIUM: 4.6 mmol/L (ref 3.5–5.1)
Sodium: 140 mmol/L (ref 135–145)
Total Bilirubin: 0.6 mg/dL (ref 0.3–1.2)
Total Protein: 6.3 g/dL — ABNORMAL LOW (ref 6.5–8.1)

## 2016-10-23 LAB — CBC
HEMATOCRIT: 31.7 % — AB (ref 39.0–52.0)
Hemoglobin: 9.7 g/dL — ABNORMAL LOW (ref 13.0–17.0)
MCH: 26.7 pg (ref 26.0–34.0)
MCHC: 30.6 g/dL (ref 30.0–36.0)
MCV: 87.3 fL (ref 78.0–100.0)
Platelets: 222 10*3/uL (ref 150–400)
RBC: 3.63 MIL/uL — ABNORMAL LOW (ref 4.22–5.81)
RDW: 20 % — AB (ref 11.5–15.5)
WBC: 9.1 10*3/uL (ref 4.0–10.5)

## 2016-10-23 LAB — GLUCOSE, CAPILLARY
Glucose-Capillary: 115 mg/dL — ABNORMAL HIGH (ref 65–99)
Glucose-Capillary: 126 mg/dL — ABNORMAL HIGH (ref 65–99)

## 2016-10-23 MED ORDER — DOXYCYCLINE HYCLATE 50 MG PO CAPS: 100.0000 mg | ORAL_CAPSULE | Freq: Two times a day (BID) | ORAL | 0 refills | Status: DC

## 2016-10-23 MED ORDER — OXYCODONE HCL 5 MG PO TABS: 10.0000 mg | ORAL_TABLET | Freq: Once | ORAL | Status: DC

## 2016-10-23 MED ORDER — HYDROCODONE-ACETAMINOPHEN 5-325 MG PO TABS: 1.0000 | ORAL_TABLET | Freq: Four times a day (QID) | ORAL | 0 refills | Status: DC | PRN

## 2016-10-23 MED ORDER — AMOXICILLIN-POT CLAVULANATE 875-125 MG PO TABS: 1.0000 | ORAL_TABLET | Freq: Two times a day (BID) | ORAL | 0 refills | Status: AC

## 2016-10-23 MED ORDER — POLYETHYLENE GLYCOL 3350 17 G PO PACK: 17.0000 g | PACK | Freq: Every day | ORAL | 0 refills | Status: AC | PRN

## 2016-10-23 MED ORDER — TORSEMIDE 20 MG PO TABS: 60.0000 mg | ORAL_TABLET | Freq: Every day | ORAL | Status: DC

## 2016-10-23 MED ORDER — SACUBITRIL-VALSARTAN 97-103 MG PO TABS: 1.0000 | ORAL_TABLET | Freq: Two times a day (BID) | ORAL | 1 refills | Status: AC

## 2016-10-23 NOTE — Clinical Social Work Note (Signed)
CSW facilitated patient discharge including contacting patient family (patient requested that CSW call his mother but she was not at home) and facility to confirm patient discharge plans. Clinical information faxed to facility and family agreeable with plan. CSW arranged ambulance transport via [facility] to [facility]. RN to call report prior to discharge [phone]).  CSW will sign off for now as social work intervention is no longer needed. Please consult us again if new needs arise.  Charlynn CourtSarah Jemar Paulsen, CSW [phone]

## 2016-10-23 NOTE — Progress Notes (Signed)
Pt discharged to [facility]. IV and telemetry box removed. Pt transported via [facility]. Report called to receiving nurse. All questions answered.   Berdine DanceLauren Moffitt BSN, RN

## 2016-10-23 NOTE — Care Management Note (Signed)
Case Management Note  Patient Details  Name: Ronald Simpson MRN: [id number] Date of Birth: 19-Jan-1946  Subjective/Objective:    From [facility], presents with sepsis, aki, severe hyperkalemia, pvd with gangrenous left foot, chronic chf,  Severe protein calorie malnutrition, indpendent dm, pafib  And uti.              Action/Plan: NCM will follow along with CSW for dc needs.    Expected Discharge Date:  10/23/16               Expected Discharge Plan:  Skilled Nursing Facility  In-House Referral:  Clinical Social Work  Discharge planning Services  CM Consult  Post Acute Care Choice:  NA Choice offered to:  NA  DME Arranged:    DME Agency:     HH Arranged:    HH Agency:     Status of Service:  Completed, signed off  If discussed at [employer] Length of Stay Meetings, dates discussed:  7/12  Discharge Disposition: skilled facility   Additional Comments:  10/23/16- 1500- Shatha Hooser RN, CM- pt for d/c back to [facility] today- CSW following-   Zenda AlpersWebster, Lenn SinkKristi Hall, RN 10/23/2016, 3:09 PM [phone]

## 2016-10-23 NOTE — Progress Notes (Signed)
PT Cancellation Note  Patient Details Name: Ronald Simpson MRN: [id number] DOB: 1946/04/07   Cancelled Treatment:    Reason Eval/Treat Not Completed: (P) Patient declined, no reason specified Pt emphatically refused therapy session today despite counseling on the benefits of mobility in the healing process and the precipitous loss of strength with prolonged bed rest. PT willing to return as able if pt changes his mind.    [name] B. [name] PT, DPT Acute Rehabilitation  [phone] Pager [phone]   [name] 10/23/2016, 9:24 AM

## 2016-10-23 NOTE — Progress Notes (Signed)
Progress Note  SUBJECTIVE:    POD #3  Wanting pain medicine this am.   OBJECTIVE:   Vitals:   10/23/16 0500 10/23/16 0738  BP: (!) 168/82 (!) 168/82  Pulse: 78 81  Resp: 13 16  Temp: 97.6 F (36.4 C)     Intake/Output Summary (Last 24 hours) at 10/23/16 0837 Last data filed at 10/23/16 0610  Gross per 24 hour  Intake             1432 ml  Output             1075 ml  Net              357 ml   Right foot dressing just changed by RN. Wound bed is clean with good granulation tissue.   ASSESSMENT/PLAN:   71 y.o. male is s/p: Left third toe amputation and metatarsal head amputation 3 Days Post-Op   Ok from vascular standpoint for d/c. Will follow-up as outpatient.  If discharged, changed IV abx to po for total of 2 weeks.   Raymond GurneyKimberly A Case Vassell 10/23/2016 8:37 AM -- LABS:   CBC    Component Value Date/Time   WBC 9.1 10/23/2016 0330   HGB 9.7 (L) 10/23/2016 0330   HCT 31.7 (L) 10/23/2016 0330   PLT 222 10/23/2016 0330    BMET    Component Value Date/Time   NA 140 10/23/2016 0330   NA 132 (A) 09/30/2016   K 4.6 10/23/2016 0330   CL 114 (H) 10/23/2016 0330   CO2 22 10/23/2016 0330   GLUCOSE 116 (H) 10/23/2016 0330   BUN 20 10/23/2016 0330   BUN 52 (A) 09/30/2016   CREATININE 0.79 10/23/2016 0330   CALCIUM 8.2 (L) 10/23/2016 0330   GFRNONAA >60 10/23/2016 0330   GFRAA >60 10/23/2016 0330    COAG Lab Results  Component Value Date   INR 1.04 10/17/2016   INR 1.57 08/27/2016   INR 1.26 08/02/2016   PROTIME 48.3 (A) 07/18/2016   PROTIME 62.4 (A) 07/15/2016   PROTIME 31.5 (A) 07/10/2016   No results found for: PTT  ANTIBIOTICS:   Anti-infectives    Start     Dose/Rate Route Frequency Ordered Stop   10/21/16 1800  vancomycin (VANCOCIN) 500 mg in sodium chloride 0.9 % 100 mL IVPB     500 mg 100 mL/hr over 60 Minutes Intravenous Every 12 hours 10/21/16 1150     10/20/16 2000  vancomycin (VANCOCIN) IVPB 1000 mg/200 mL premix  Status:  Discontinued     1,000 mg 200 mL/hr over 60 Minutes Intravenous Every 24 hours 10/20/16 1340 10/21/16 1150   10/20/16 0745  piperacillin-tazobactam (ZOSYN) IVPB 3.375 g     3.375 g 100 mL/hr over 30 Minutes Intravenous To Surgery 10/20/16 0741 10/20/16 0835   10/19/16 2000  vancomycin (VANCOCIN) IVPB 750 mg/150 ml premix  Status:  Discontinued     750 mg 150 mL/hr over 60 Minutes Intravenous Every 48 hours 10/17/16 2009 10/19/16 1210   10/19/16 2000  vancomycin (VANCOCIN) IVPB 750 mg/150 ml premix  Status:  Discontinued     750 mg 150 mL/hr over 60 Minutes Intravenous Every 24 hours 10/19/16 1210 10/20/16 1340   10/19/16 1700  piperacillin-tazobactam (ZOSYN) IVPB 3.375 g  Status:  Discontinued     3.375 g 12.5 mL/hr over 240 Minutes Intravenous Every 8 hours 10/19/16 1211 10/21/16 0945   10/18/16 0200  piperacillin-tazobactam (ZOSYN) IVPB 2.25 g  Status:  Discontinued  2.25 g 100 mL/hr over 30 Minutes Intravenous Every 8 hours 10/17/16 2009 10/19/16 1210   10/17/16 1930  piperacillin-tazobactam (ZOSYN) IVPB 3.375 g     3.375 g 100 mL/hr over 30 Minutes Intravenous  Once 10/17/16 1929 10/17/16 2037   10/17/16 1930  vancomycin (VANCOCIN) IVPB 1000 mg/200 mL premix     1,000 mg 200 mL/hr over 60 Minutes Intravenous  Once 10/17/16 1929 10/17/16 2107       Maris Berger, PA-C [facility] Office: [phone] Pager: [phone] 10/23/2016 8:37 AM
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
## 2016-10-24 ENCOUNTER — Encounter: Payer: Self-pay | Admitting: Adult Health

## 2016-10-24 ENCOUNTER — Encounter: Payer: Self-pay | Admitting: Internal Medicine

## 2016-10-24 ENCOUNTER — Non-Acute Institutional Stay (SKILLED_NURSING_FACILITY): Payer: Medicare Other | Admitting: Adult Health

## 2016-10-24 DIAGNOSIS — E1151 Type 2 diabetes mellitus with diabetic peripheral angiopathy without gangrene: Secondary | ICD-10-CM | POA: Diagnosis not present

## 2016-10-24 DIAGNOSIS — E039 Hypothyroidism, unspecified: Secondary | ICD-10-CM

## 2016-10-24 DIAGNOSIS — I5022 Chronic systolic (congestive) heart failure: Secondary | ICD-10-CM | POA: Diagnosis not present

## 2016-10-24 DIAGNOSIS — J9611 Chronic respiratory failure with hypoxia: Secondary | ICD-10-CM

## 2016-10-24 DIAGNOSIS — I480 Paroxysmal atrial fibrillation: Secondary | ICD-10-CM | POA: Diagnosis not present

## 2016-10-24 DIAGNOSIS — I96 Gangrene, not elsewhere classified: Secondary | ICD-10-CM | POA: Diagnosis not present

## 2016-10-24 DIAGNOSIS — A419 Sepsis, unspecified organism: Secondary | ICD-10-CM | POA: Diagnosis not present

## 2016-10-24 DIAGNOSIS — E43 Unspecified severe protein-calorie malnutrition: Secondary | ICD-10-CM | POA: Diagnosis not present

## 2016-10-24 DIAGNOSIS — D62 Acute posthemorrhagic anemia: Secondary | ICD-10-CM

## 2016-10-24 DIAGNOSIS — F329 Major depressive disorder, single episode, unspecified: Secondary | ICD-10-CM

## 2016-10-24 DIAGNOSIS — R531 Weakness: Secondary | ICD-10-CM

## 2016-10-24 DIAGNOSIS — N179 Acute kidney failure, unspecified: Secondary | ICD-10-CM | POA: Diagnosis not present

## 2016-10-24 DIAGNOSIS — E875 Hyperkalemia: Secondary | ICD-10-CM

## 2016-10-24 DIAGNOSIS — F32A Depression, unspecified: Secondary | ICD-10-CM

## 2016-10-24 DIAGNOSIS — Z8719 Personal history of other diseases of the digestive system: Secondary | ICD-10-CM
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
## 2016-10-24 NOTE — Progress Notes (Signed)
    NURSING HOME LOCATION:  [facility] ROOM NUMBER:  113-A  CODE STATUS:  Full Code  PCP:  [name], William F, MD  [address] [place] [id number]  This is a nursing facility follow up for specific acute issue of  of chronic medical diagnoses  Nursing Facility readmission within 30 days  Interim medical record and care since [facility] was updated with review of diagnostic studies and change in clinical status since last visit were documented.  HPI:  Review of systems: Dementia invalidated responses. Date given as   Constitutional: No fever,significant weight change, fatigue  Eyes: No redness, discharge, pain, vision change ENT/mouth: No nasal congestion,  purulent discharge, earache,change in hearing ,sore throat  Cardiovascular: No chest pain, palpitations,paroxysmal nocturnal dyspnea, claudication, edema  Respiratory: No cough, sputum production,hemoptysis, DOE , significant snoring,apnea  Gastrointestinal: No heartburn,dysphagia,abdominal pain, nausea / vomiting,rectal bleeding, melena,change in bowels Genitourinary: No dysuria,hematuria, pyuria,  incontinence, nocturia Musculoskeletal: No joint stiffness, joint swelling, weakness,pain Dermatologic: No rash, pruritus, change in appearance of skin Neurologic: No dizziness,headache,syncope, seizures, numbness , tingling Psychiatric: No significant anxiety , depression, insomnia, anorexia Endocrine: No change in hair/skin/ nails, excessive thirst, excessive hunger, excessive urination  Hematologic/lymphatic: No significant bruising, lymphadenopathy,abnormal bleeding Allergy/immunology: No itchy/ watery eyes, significant sneezing, urticaria, angioedema  Physical exam:  Pertinent or positive findings: General appearance:Adequately nourished; no acute distress , increased work of breathing is present.   Lymphatic: No lymphadenopathy about the head, neck, axilla . Eyes: No conjunctival inflammation or  lid edema is present. There is no scleral icterus. Ears:  External ear exam shows no significant lesions or deformities.   Nose:  External nasal examination shows no deformity or inflammation. Nasal mucosa are pink and moist without lesions ,exudates Oral exam: lips and gums are healthy appearing.There is no oropharyngeal erythema or exudate . Neck:  No thyromegaly, masses, tenderness noted.    Heart:  Normal rate and regular rhythm. S1 and S2 normal without gallop, murmur, click, rub .  Lungs:Chest clear to auscultation without wheezes, rhonchi,rales , rubs. Abdomen:Bowel sounds are normal. Abdomen is soft and nontender with no organomegaly, hernias,masses. GU: deferred  Extremities:  No cyanosis, clubbing,edema  Neurologic exam : Cn 2-7 intact Strength equal  in upper & lower extremities Balance,Rhomberg,finger to nose testing could not be completed due to clinical state Deep tendon reflexes are equal Skin: Warm & dry w/o tenting. No significant lesions or rash.  See summary under each active problem in the Problem List with associated updated therapeutic plan     This encounter was created in error - please disregard.

## 2016-10-27 ENCOUNTER — Non-Acute Institutional Stay (SKILLED_NURSING_FACILITY): Payer: Medicare Other | Admitting: Internal Medicine

## 2016-10-27 ENCOUNTER — Encounter: Payer: Self-pay | Admitting: Internal Medicine

## 2016-10-27 DIAGNOSIS — I5022 Chronic systolic (congestive) heart failure: Secondary | ICD-10-CM | POA: Diagnosis not present

## 2016-10-27 DIAGNOSIS — I480 Paroxysmal atrial fibrillation: Secondary | ICD-10-CM | POA: Diagnosis not present

## 2016-10-27 DIAGNOSIS — E43 Unspecified severe protein-calorie malnutrition: Secondary | ICD-10-CM | POA: Diagnosis not present

## 2016-10-27 DIAGNOSIS — B369 Superficial mycosis, unspecified: Secondary | ICD-10-CM | POA: Diagnosis not present

## 2016-10-27 DIAGNOSIS — Z9189 Other specified personal risk factors, not elsewhere classified: Secondary | ICD-10-CM

## 2016-10-27 DIAGNOSIS — E1152 Type 2 diabetes mellitus with diabetic peripheral angiopathy with gangrene: Secondary | ICD-10-CM | POA: Diagnosis not present

## 2016-10-27 NOTE — Progress Notes (Signed)
NURSING HOME LOCATION:  [facility] ROOM NUMBER:  113-A  CODE STATUS:  Full Code  PCP:  Pecola Lawless, MD [address] [place] [address]  This is a nursing facility follow up for Nursing Facility readmission within 30 days  Interim medical record and care since last [facility] visit was updated with review of diagnostic studies and change in clinical status since last visit were documented.  HPI: The patient was hospitalized 7/6-7/12/18 with gangrene of the left foot. The patient was admitted from the vascular surgeon's office for hypotension and hypothermia . Associated mild leukocytosis and elevated lactic acid level were documented. He also exhibited hyperkalemia in the setting of acute renal failure. The left foot revealed gangrenous changes. No palpable pulses were present. In December 2017 the patient had left fourth and fifth toes amputated and been receiving wound care at the SNF. Imaging revealed inflammatory arthritis; osteomyelitis could not be definitely ruled out. The third left toe and metatarsal head were amputated 7/9. Wet-to-dry dressings were continued. Vancomycin and Zosyn were initiated;blood cultures revealed no growth. Creatinine peaked at 3.53, potassium was 7.5 at admission. At discharge creatinine was 0.79 and GFR greater than 60. Potassium was 4.6. He exhibited a normochromic, normocytic anemia with hemoglobin 9.7/hematocrit 31.7. Hemoglobin A1c was 7.4% on 10/20/16. He was to continue Augmentin until 10/30/16. Also he was to continue doxycycline 100 milligrams twice a day until 7/26 Significantly he was discharged on hydrocodone 5/325 one tablet every 6 hours as needed for moderate to severe pain. He has a history of having received Narcan on at least 5 occasions for excessive somnolence and hypoxia due to opiod administration.  Using an opioid/overdose calculator ,his risk of adverse reaction or even death over the next 6 months would be over  80% were he to continue this on a maintenance basis. The comorbidities include a history of cocaine use and tobacco abuse. He's had a history of stroke as well as acute renal insufficiency. He also has a diagnosis of chronic systolic congestive heart failure. Echo revealed an ejection fraction of 35-40 percent with diffuse hypokinesis worse in the inferior and inferolateral walls. He has moderate LA enlargement and moderate right ventricular dilation. The right atrium was severely dilated. This is in the context of history of paroxysmal atrial fibrillation. He has moderate tricuspid valve regurgitation.  He has cirrhosis and is at risk for nonmalignant pancreatic disease. 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 Additionally is on antidepressant. These as would give him a risk score of 62 which documents > 83% risk. That risk has been explained to the patient on multiple occasions and he agreed to weaning & discontinuing the opioids previously.  Review of systems:  His main concern is  increasing the opioid pain medication . He has noted a rash around the glans penis. He also has had some straining with urination.  Constitutional: No fever,significant weight change, fatigue  Eyes: No redness, discharge, pain, vision change ENT/mouth: No nasal congestion,  purulent discharge, earache,change in hearing ,sore throat  Cardiovascular: No chest pain, palpitations,paroxysmal nocturnal dyspnea, claudication, edema  Respiratory: No cough, sputum production,hemoptysis, DOE , significant snoring,apnea  Gastrointestinal: No heartburn,dysphagia,abdominal pain, nausea / vomiting,rectal bleeding, melena,change in bowels Genitourinary: No dysuria,hematuria, pyuria,  incontinence, nocturia Dermatologic: No rash, pruritus, change in appearance of skin Neurologic: No dizziness,headache,syncope, seizures, numbness , tingling Psychiatric: No significant anxiety , depression, insomnia, anorexia Endocrine: No change in  hair/skin/ nails, excessive thirst, excessive hunger, excessive urination  Hematologic/lymphatic: No significant bruising, lymphadenopathy,abnormal bleeding Allergy/immunology: No itchy/ watery eyes, significant sneezing, urticaria, angioedema  Physical exam:  Pertinent or positive findings: Significantly @ bedside are saltine crackers  & butterscotch candies. His breakfast was essentially untouched except for a few bites from the toast.  Alopecia is present. He is Transport plannerunshaven. Affect is flat and depressed. He has only a few lower teeth, the maxilla is edentulous. Breath sounds are decreased. He has a rare extra beat. Interosseous wasting is noted as well as some limb atrophy. Well-healed sternal op scar is present. He has a large tattoo over the right pectoralis area. 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 He has only 2 remaining toes on the left foot. There is severe atrophy of the left gastrocnemius. Pedal pulses are absent on the left. Remaining toenails are thickened and deformed. He has somewhat of a macerated dermatitis around the base of the glans penis suggesting candidal infection.  General appearance:no acute distress , increased work of breathing is present.   Lymphatic: No lymphadenopathy about the head, neck, axilla . Eyes: No conjunctival inflammation or lid edema is present. There is no scleral icterus. Ears:  External ear exam shows no significant lesions or deformities.   Nose:  External nasal examination shows no deformity or inflammation. Nasal mucosa are pink and moist without lesions ,exudates Oral exam: lips and gums are healthy appearing.There is no oropharyngeal erythema or exudate . Neck:  No thyromegaly, masses, tenderness noted.    Heart:  No gallop, murmur, click, rub .  Lungs: without wheezes, rhonchi,rales , rubs. Abdomen:Bowel sounds are normal. Abdomen is soft and nontender with no organomegaly, hernias,masses. GU: deferred  Extremities:  No cyanosis, clubbing,edema    Neurologic exam : Balance,Rhomberg,finger to nose testing could not be completed due to clinical state Skin: Warm & dry w/o tenting.  See summary under each active problem in the Problem List with associated updated therapeutic plan

## 2016-10-27 NOTE — Patient Instructions (Signed)
See assessment and plan under each diagnosis in the problem list and acutely for this visit 

## 2016-10-27 NOTE — Assessment & Plan Note (Signed)
Risk of over 83% of adverse reaction or even death over the next 6 months with maintenance opioids discussed with him Pathophysiology of neuropathic pain discussed At least 3 episodes of hypopnea requiring Narcan discussed with him

## 2016-10-27 NOTE — Assessment & Plan Note (Signed)
Nizoral bid

## 2016-10-29 ENCOUNTER — Encounter: Payer: Self-pay | Admitting: Internal Medicine

## 2016-10-30 ENCOUNTER — Encounter: Payer: Self-pay | Admitting: Adult Health

## 2016-10-30 ENCOUNTER — Non-Acute Institutional Stay (SKILLED_NURSING_FACILITY): Payer: Medicare Other | Admitting: Adult Health

## 2016-10-30 DIAGNOSIS — R630 Anorexia: Secondary | ICD-10-CM | POA: Diagnosis not present

## 2016-10-30 DIAGNOSIS — Z7189 Other specified counseling: Secondary | ICD-10-CM | POA: Diagnosis not present

## 2016-10-30 DIAGNOSIS — F32A Depression, unspecified: Secondary | ICD-10-CM

## 2016-10-30 DIAGNOSIS — F329 Major depressive disorder, single episode, unspecified: Secondary | ICD-10-CM
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
## 2016-11-03 ENCOUNTER — Encounter: Payer: Self-pay | Admitting: Vascular Surgery

## 2016-11-03 ENCOUNTER — Other Ambulatory Visit: Payer: Self-pay

## 2016-11-03 MED ORDER — HYDROCODONE-ACETAMINOPHEN 5-325 MG PO TABS: 1.0000 | ORAL_TABLET | Freq: Four times a day (QID) | ORAL | 0 refills | Status: DC | PRN

## 2016-11-03 NOTE — Telephone Encounter (Signed)
Prescription request was received from:    [facility] [address] [place]  [address]  Phone: [phone] Fax: [phone] 

## 2016-11-04 ENCOUNTER — Encounter: Payer: Self-pay | Admitting: Vascular Surgery

## 2016-11-04 ENCOUNTER — Other Ambulatory Visit: Payer: Self-pay | Admitting: *Deleted

## 2016-11-04 ENCOUNTER — Other Ambulatory Visit: Payer: Self-pay

## 2016-11-04 ENCOUNTER — Ambulatory Visit (INDEPENDENT_AMBULATORY_CARE_PROVIDER_SITE_OTHER): Payer: Self-pay | Admitting: Vascular Surgery

## 2016-11-04 VITALS — BP 119/72 | HR 74 | Temp 97.5°F | Resp 20 | Ht 69.0 in | Wt 139.0 lb

## 2016-11-04 DIAGNOSIS — I96 Gangrene, not elsewhere classified: Secondary | ICD-10-CM

## 2016-11-04 MED ORDER — OXYCODONE-ACETAMINOPHEN 10-325 MG PO TABS: 1.0000 | ORAL_TABLET | ORAL | 0 refills | Status: DC | PRN

## 2016-11-04 NOTE — Progress Notes (Signed)
Patient name: Ronald Simpson MRN: [id number] DOB: 1945-08-15 Sex: male  REASON FOR VISIT: Follow-up left third toe amputation on 10/20/2016  HPI: Ronald Simpson is a 71 y.o. male here for follow-up. I was admitted in [place] July with sepsis. Well-known to me from prior left fourth and fifth toe amputation. Prolonged healing for months. This is despite arteriogram showing peroneal artery runoff with normal flow to the popliteal level. He had a gangrenous changes of his third toe and underwent open third toe amputation. At that time he had no evidence of involvement of the second metatarsal head of her second toe is here today for follow-up. He reports severe pain and is requesting Percocet  Current Outpatient Prescriptions  Medication Sig Dispense Refill  . acetaminophen (TYLENOL) 325 MG tablet Take 650 mg by mouth every 6 (six) hours as needed for headache (pain).     Marland Kitchen. aspirin EC 81 MG EC tablet Take 1 tablet (81 mg total) by mouth daily. 30 tablet 1  . atorvastatin (LIPITOR) 20 MG tablet Take 20 mg by mouth at bedtime.     . barrier cream (NON-SPECIFIED) CREA Apply 1 application topically See admin instructions. Apply to inner right scrotum twice daily for denuded area r/t scrotal edema    . carvedilol (COREG) 6.25 MG tablet Take 6.25 mg by mouth 2 (two) times daily with a meal.    . colchicine 0.6 MG tablet Take 0.6 mg by mouth daily.    . digoxin (LANOXIN) 0.125 MG tablet Take 1 tablet (0.125 mg total) by mouth daily.    Marland Kitchen. doxycycline (VIBRAMYCIN) 100 MG capsule Take 100 mg by mouth 2 (two) times daily.    . ferrous sulfate 325 (65 FE) MG tablet Take 1 tablet (325 mg total) by mouth daily with breakfast.  3  . HYDROcodone-acetaminophen (NORCO/VICODIN) 5-325 MG tablet Take 1 tablet by mouth every 6 (six) hours as needed for moderate pain or severe pain. 120 tablet 0  . insulin detemir (LEVEMIR) 100 UNIT/ML injection Inject 8 Units into the skin at bedtime.     Marland Kitchen. ipratropium-albuterol (DUONEB) 0.5-2.5 (3) MG/3ML SOLN Take 3 mLs by nebulization every 6 (six) hours as needed (shortness of breath).    Marland Kitchen. KETOCONAZOLE, TOPICAL, 1 % SHAM Apply 1 application topically 2 (two) times daily. Apply to penis for rash x10 days    . levothyroxine (SYNTHROID, LEVOTHROID) 25 MCG tablet Take 25 mcg by mouth daily before breakfast.    . mirtazapine (REMERON) 15 MG tablet Take 15 mg by mouth at bedtime.    . nitroGLYCERIN (NITROSTAT) 0.4 MG SL tablet Place 0.4 mg under the tongue every 5 (five) minutes as needed for chest pain.    . Nutritional Supplements (ENSURE ENLIVE PO) Take 237 mLs by mouth 3 (three) times daily between meals.    . OXYGEN Inhale 2 L into the lungs as needed (shortness of breath or O2 sat <90%).     . pantoprazole (PROTONIX) 40 MG tablet Take 1 tablet (40 mg total) by mouth daily. 30 tablet 3  . polyethylene glycol (MIRALAX / GLYCOLAX) packet Take 17 g by mouth daily as needed for mild constipation. 14 each 0  . saccharomyces boulardii (FLORASTOR) 250 MG capsule Take 250 mg by mouth 2 (two) times daily.    Melene Muller. [START ON 11/05/2016] sacubitril-valsartan (ENTRESTO) 97-103 MG Take 1 tablet by mouth 2 (two) times daily. 60 tablet 1  . senna (SENOKOT) 8.6 MG TABS tablet Take 1 tablet (8.6 mg total) by mouth  daily as needed for mild constipation. 120 each 0  . sertraline (ZOLOFT) 50 MG tablet Take 50 mg by mouth daily.    Marland Kitchen spironolactone (ALDACTONE) 25 MG tablet Take 1 tablet (25 mg total) by mouth daily.    Marland Kitchen torsemide (DEMADEX) 20 MG tablet Take 3 tablets (60 mg total) by mouth daily.    Marland Kitchen oxyCODONE-acetaminophen (PERCOCET) 10-325 MG tablet Take 1 tablet by mouth every 4 (four) hours as needed for pain. 20 tablet 0   No current facility-administered medications for this visit.      PHYSICAL EXAM: Vitals:   11/04/16 0943  BP: 119/72  Pulse: 74  Resp: 20  Temp: (!) 97.5 F (36.4 C)  TempSrc: Oral  SpO2: 100%  Weight: 139 lb (63 kg)  Height: 5'  9" (1.753 m)    GENERAL: The patient is a well-nourished male, in no acute distress. The vital signs are documented above. Dusky changes at the second metatarsal head. No evidence of erythema or purulence.  MEDICAL ISSUES: Patient continues to have palpable left popliteal pulse and despite this has very poor healing of his third toe amputation. He will continue his local wound care. I explained that he is at very high risk for requiring further amputation and this would either be formal transmetatarsal amputation with continued risk for healing versus left below-knee amputation which should heal with his popliteal pulse. Patient has severe malnutrition making this more difficult for healing as well. He is requesting pain medication. Was given a prescription for Percocet 10/325 one every 4 hours as needed #20. We'll see him again in 2 weeks. Will continue a daily normal saline dressing changes   Larina Earthly, MD FACS [facility] Tel [phone] Pager [phone]

## 2016-11-06 ENCOUNTER — Encounter: Payer: Self-pay | Admitting: Internal Medicine

## 2016-11-06 ENCOUNTER — Encounter: Payer: Self-pay | Admitting: Vascular Surgery

## 2016-11-06 ENCOUNTER — Non-Acute Institutional Stay (SKILLED_NURSING_FACILITY): Payer: Medicare Other

## 2016-11-06 ENCOUNTER — Non-Acute Institutional Stay (SKILLED_NURSING_FACILITY): Payer: Medicare Other | Admitting: Internal Medicine

## 2016-11-06 DIAGNOSIS — G63 Polyneuropathy in diseases classified elsewhere: Secondary | ICD-10-CM

## 2016-11-06 DIAGNOSIS — Z9189 Other specified personal risk factors, not elsewhere classified: Secondary | ICD-10-CM

## 2016-11-06 DIAGNOSIS — E43 Unspecified severe protein-calorie malnutrition: Secondary | ICD-10-CM

## 2016-11-06 DIAGNOSIS — Z0000 Encounter for general adult medical examination without abnormal findings: Secondary | ICD-10-CM | POA: Diagnosis not present

## 2016-11-06 NOTE — Progress Notes (Signed)
Subjective:   Ronald Simpson is a 71 y.o. male who presents for an Initial Medicare Annual Wellness Visit at [facility]   Objective:    Today's Vitals   11/06/16 1445 11/06/16 1446  BP: 130/62   Pulse: 69   Temp: 97.9 F (36.6 C)   TempSrc: Oral   SpO2: 95%   Weight: 139 lb (63 kg)   Height: 5\' 9"  (1.753 m)   PainSc:  7    Body mass index is 20.53 kg/m.  Current Medications (verified) Outpatient Encounter Prescriptions as of 11/06/2016  Medication Sig  . acetaminophen (TYLENOL) 325 MG tablet Take 650 mg by mouth every 6 (six) hours as needed for headache (pain).   Marland Kitchen aspirin EC 81 MG EC tablet Take 1 tablet (81 mg total) by mouth daily.  Marland Kitchen atorvastatin (LIPITOR) 20 MG tablet Take 20 mg by mouth at bedtime.   . barrier cream (NON-SPECIFIED) CREA Apply 1 application topically See admin instructions. Apply to inner right scrotum twice daily for denuded area r/t scrotal edema  . carvedilol (COREG) 6.25 MG tablet Take 6.25 mg by mouth 2 (two) times daily with a meal.  . colchicine 0.6 MG tablet Take 0.6 mg by mouth daily.  . digoxin (LANOXIN) 0.125 MG tablet Take 1 tablet (0.125 mg total) by mouth daily.  . ferrous sulfate 325 (65 FE) MG tablet Take 1 tablet (325 mg total) by mouth daily with breakfast.  . geriatric multivitamins-minerals (ELDERTONIC/GEVRABON) ELIX Take 15 mLs by mouth 2 (two) times daily.  . insulin detemir (LEVEMIR) 100 UNIT/ML injection Inject 8 Units into the skin at bedtime.  Marland Kitchen ipratropium-albuterol (DUONEB) 0.5-2.5 (3) MG/3ML SOLN Take 3 mLs by nebulization every 6 (six) hours as needed (shortness of breath).  Marland Kitchen ketoconazole (NIZORAL) 2 % cream Apply 1 application topically 2 (two) times daily.  Marland Kitchen levothyroxine (SYNTHROID, LEVOTHROID) 25 MCG tablet Take 25 mcg by mouth daily before breakfast.  . nitroGLYCERIN (NITROSTAT) 0.4 MG SL tablet Place 0.4 mg under the tongue every 5 (five) minutes as needed for chest pain.  . Nutritional Supplements  (ENSURE ENLIVE PO) Take 237 mLs by mouth 3 (three) times daily between meals.  Marland Kitchen oxyCODONE-acetaminophen (PERCOCET) 10-325 MG tablet Take 1 tablet by mouth every 4 (four) hours as needed for pain.  . OXYGEN Inhale 2 L into the lungs as needed (shortness of breath or O2 sat <90%).   . pantoprazole (PROTONIX) 40 MG tablet Take 1 tablet (40 mg total) by mouth daily.  . polyethylene glycol (MIRALAX / GLYCOLAX) packet Take 17 g by mouth daily as needed for mild constipation.  . saccharomyces boulardii (FLORASTOR) 250 MG capsule Take 250 mg by mouth 2 (two) times daily.  . sacubitril-valsartan (ENTRESTO) 97-103 MG Take 1 tablet by mouth 2 (two) times daily.  Marland Kitchen senna (SENOKOT) 8.6 MG TABS tablet Take 1 tablet (8.6 mg total) by mouth daily as needed for mild constipation.  Marland Kitchen spironolactone (ALDACTONE) 25 MG tablet Take 1 tablet (25 mg total) by mouth daily.  Marland Kitchen torsemide (DEMADEX) 20 MG tablet Take 3 tablets (60 mg total) by mouth daily.  . [DISCONTINUED] KETOCONAZOLE, TOPICAL, 1 % SHAM Apply 1 application topically 2 (two) times daily. Apply to penis for rash x10 days  . [DISCONTINUED] mirtazapine (REMERON) 15 MG tablet Take 15 mg by mouth at bedtime.  . [DISCONTINUED] sertraline (ZOLOFT) 50 MG tablet Take 50 mg by mouth daily.   No facility-administered encounter medications on file as of 11/06/2016.  Allergies (verified) Hydralazine; Percocet [oxycodone-acetaminophen]; Tramadol; and Remeron [mirtazapine]   History: Past Medical History:  Diagnosis Date  . CAD (coronary artery disease)    a. remote CABG.  . Chronic anemia   . Chronic combined systolic and diastolic CHF (congestive heart failure) (HCC)   . Cirrhosis (HCC)   . CKD (chronic kidney disease), stage III   . CVA (cerebral infarction)   . DM (diabetes mellitus), type 2 with peripheral vascular complications (HCC)   . History of hepatitis C 1990s   Hep B immune  . Hypoalbuminemia   . IDDM (insulin dependent diabetes mellitus)  (HCC)   . Mitral regurgitation    a. s/p prior ring annuloplasty prosthesis per echo with mod MR by echo 07/2016.  Marland Kitchen PAF (paroxysmal atrial fibrillation) (HCC)    a. not on anticoagulation likely due to h/o IC hemorrhage, prior GIB, cirrhosis. b. suspected prior LAA clipping based on imaging.  . Peripheral neuropathy   . Peripheral vascular disease (HCC)   . Proteinuria    Past Surgical History:  Procedure Laterality Date  . AMPUTATION Right 01/07/2016   Procedure: AMPUTATION ABOVE KNEE;  Surgeon: Larina Earthly, MD;  Location: [facility] OR;  Service: Vascular;  Laterality: Right;  . AMPUTATION Left 04/04/2016   Procedure: LEFT FOURTH AND FIFTH  TOE AMPUTATION;  Surgeon: Larina Earthly, MD;  Location: [facility] OR;  Service: Vascular;  Laterality: Left;  . BELOW KNEE LEG AMPUTATION Right   . CORONARY ARTERY BYPASS GRAFT  2005  . ESOPHAGOGASTRODUODENOSCOPY N/A 05/17/2016   Procedure: ESOPHAGOGASTRODUODENOSCOPY (EGD);  Surgeon: Meryl Dare, MD;  Location: [facility] ENDOSCOPY;  Service: Endoscopy;  Laterality: N/A;  . IR GENERIC HISTORICAL  07/07/2016   IR PARACENTESIS 07/07/2016 Gershon Crane, PA-C [facility]-INTERV RAD  . IR THORACENTESIS ASP PLEURAL SPACE W/IMG GUIDE  08/04/2016  . PERIPHERAL VASCULAR CATHETERIZATION N/A 01/11/2016   Procedure: Lower Extremity Angiography;  Surgeon: Nada Libman, MD;  Location: [facility] INVASIVE CV LAB;  Service: Cardiovascular;  Laterality: N/A;  . STERNOTOMY    . TRANSMETATARSAL AMPUTATION Left 10/20/2016   Procedure: LEFT THIRD TOE  AMPUTATION;  Surgeon: Larina Earthly, MD;  Location[facility] OR;  Service: Vascular;  Laterality: Left;   Family History  Problem Relation Age of Onset  . Hypertension Other   . Diabetes Other   . Mental illness Other   . Lupus Other   . Diabetes Father   . Cancer Neg Hx   . Heart disease Neg Hx   . Stroke Neg Hx    Social History   Occupational History  . Not on file.   Social History Main Topics  . Smoking status: Former Smoker    Packs/day:  0.10    Years: 54.00    Types: Cigarettes    Quit date: 04/25/2016  . Smokeless tobacco: Never Used  . Alcohol use Yes     Comment: 04/02/2016 "aint suppose to drink in the nursing home"  . Drug use: Yes    Types: Cocaine, Marijuana     Comment: marijuana use daily, rare cocain use.  Previously did snort and inject drugs through 2017. Hasn't had access to street drugs from 10/201 7 through February/201 8  . Sexual activity: Not Currently   Tobacco Counseling Counseling given: Not Answered   Activities of Daily Living In your present state of health, do you have any difficulty performing the following activities: 11/06/2016 10/17/2016  Hearing? N N  Vision? N N  Difficulty concentrating or making decisions?  N N  Walking or climbing stairs? Y Y  Dressing or bathing? Y N  Doing errands, shopping? Malvin JohnsY Y  Preparing Food and eating ? Y -  Using the Toilet? Y -  In the past six months, have you accidently leaked urine? N -  Do you have problems with loss of bowel control? N -  Some recent data might be hidden    Immunizations and Health Maintenance Immunization History  Administered Date(s) Administered  . Influenza-Unspecified 12/14/2014, 12/14/2015  . PPD Test 01/14/2016, 04/09/2016, 08/07/2016  . Pneumococcal Polysaccharide-23 04/05/2016   Health Maintenance Due  Topic Date Due  . FOOT EXAM  10/23/1955  . OPHTHALMOLOGY EXAM  10/23/1955  . COLONOSCOPY  10/23/1997    Patient Care Team: [name], William F, MD as PCP - General (Internal Medicine)  Indicate any recent Medical Services you may have received from other than [facility] providers in the past year (date may be approximate).    Assessment:   This is a routine wellness examination for [employer].   Hearing/Vision screen No exam data present  Dietary issues and exercise activities discussed: Current Exercise Habits: The patient does not participate in regular exercise at present, Exercise limited by: None identified  Goals     None     Depression Screen PHQ 2/9 Scores 11/06/2016  PHQ - 2 Score 0    Fall Risk Fall Risk  11/06/2016  Falls in the past year? No    Cognitive Function:     6CIT Screen 11/06/2016  What Year? 0 points  What month? 0 points  What time? 0 points  Count back from 20 0 points  Months in reverse 2 points  Repeat phrase 4 points  Total Score 6    Screening Tests Health Maintenance  Topic Date Due  . FOOT EXAM  10/23/1955  . OPHTHALMOLOGY EXAM  10/23/1955  . COLONOSCOPY  10/23/1997  . TETANUS/TDAP  10/01/2026 (Originally 10[birth year]24/1966)  . URINE MICROALBUMIN  04/14/2028 (Originally 10/23/1955)  . INFLUENZA VACCINE  11/12/2016  . PNA vac Low Risk Adult (2 of 2 - PCV13) 04/05/2017  . HEMOGLOBIN A1C  04/22/2017  . Hepatitis C Screening  Completed        Plan:    I have personally reviewed and addressed the Medicare Annual Wellness questionnaire and have noted the following in the patient's chart:  A. Medical and social history B. Use of alcohol, tobacco or illicit drugs  C. Current medications and supplements D. Functional ability and status E.  Nutritional status F.  Physical activity G. Advance directives H. List of other physicians I.  Hospitalizations, surgeries, and ER visits in previous 12 months J.  Vitals K. Screenings to include hearing, vision, cognitive, depression L. Referrals and appointments - none  In addition, I have reviewed and discussed with patient certain preventive protocols, quality metrics, and best practice recommendations. A written personalized care plan for preventive services as well as general preventive health recommendations were provided to patient.  See attached scanned questionnaire for additional information.   Signed,   Annetta MawSara Gonthier, RN Nurse Health Advisor   Quick Notes   Health Maintenance: Foot exam, eye exam, microalbumin, PNA 13 due and ordered     Abnormal Screen: 6 CIT-6     Patient Concerns: Asked for  medication refill for Ketoxonazole      Nurse Concerns: None  I have personally reviewed the health advisor's clinical note, was available for consultation, and agree with the assessment and plan as written. Chrissie NoaWilliam  Minerva EndsF Hopper M.D., FACP, [facility]

## 2016-11-06 NOTE — Assessment & Plan Note (Signed)
Nutrition has been consulted and protein supplements recommended. Patient is marginally compliant with the protein supplementation. Both Dr. Arbie CookeyEarly and I have discussed the critical impact of nutrition for wound healing with the patient

## 2016-11-06 NOTE — Patient Instructions (Signed)
See assessment and plan under each diagnosis in the problem list and acutely for this visit 

## 2016-11-06 NOTE — Assessment & Plan Note (Signed)
11/06/16 Lyrica would be more appropriate for pain in the context of chronic peripheral neuropathy as opposed to opioids with the associated documented risks

## 2016-11-06 NOTE — Assessment & Plan Note (Signed)
11/06/16 I informed him that the opioid pain medication must come from Dr Bosie HelperEarly's office Staff reports that he is taking it every 4 hours, he was given 20 pills on 7/24 Having administered Narcan personally to this patient 3, I cannot medicolegally or ethically order maintenance narcotics knowing the risk of recurrent apnea Unfortunately the patient does have a history of alcohol, cannabis, tobacco, and cocaine abuse

## 2016-11-06 NOTE — Progress Notes (Signed)
NURSING HOME LOCATION:  [facility] ROOM NUMBER:  [id number]  CODE STATUS:  Full Code   PCP:  [name], Trinka Keshishyan F, MD  [address] [place] [id number]  This is a nursing facility follow up for specific acute issue of adult failure to thrive and opiod associated risks.  Interim medical record and care since [facility] was updated with review of diagnostic studies and change in clinical status since last visit were documented.  HPI: Staff reports the patient continues to be noncompliant. He refuses weights , glucose checks and medications intermittently except for narcotics which he request on schedule.  PT/OT released him as he was not participating in recommended activities. He is essentially bed ridden, sleeping most of the day , awakening to request pain meds. Protein supplements have been ordered which he states he will ingest "occasionally". The office visit with Dr. Shirline FreesEarly,Vascular & Vein Specialist  for delayed healing following prior left fourth and fifth toe amputation 11/04/16 was reviewed.  He had been readmitted in July with sepsis and gangrenous changes of the third toe origin necessitating open amputation. Dr. Arbie CookeyEarly was perplexed by the prolonged delay in healing as arteriogram had shown peroneal artery runoff with normal flow to the popliteal level.On exam Dr. Arbie CookeyEarly documented palpable left popliteal pulse.  He informed patient that he was at very high risk for requiring further amputation either transmetatarsal with continuing risk of delayed healing versus left below-the-knee amputation which would be expected to heal with the intact popliteal pulse. He commented on the patient's severe malnutrition which impairs healing. He has been seen by the Nutritionist  at the SNF and the critical need to control sugars and enhance protein supplementation discussed with the patient. Despite this he has not been compliant with dietary recommendations. He informed Dr.  Arbie CookeyEarly that he was having severe pain and Percocet was increased every 4 hours. Follow-up appointment was made for 2 weeks. Wound care was to be continued at the SNF. He was seen by the psychiatric nurse practitioner 7/18 and Zoloft increased to 50 mg Remeron 15 mg at bedtime was added for depression and to improve appetite. On 7/19 both agents were discontinued at the request the family. Eldertonic was initiated. Glucoses have ranged from 98-233.  Review of systems: His focus continues to be pain in the foot. The penile rash persists but is improved. He categorically denies depression.  Physical exam:  Pertinent or positive findings:He appears somewhat cachectic with temporal wasting as well as some limb atrophy. Initially he was asleep. Upon awakening he remained very lethargic with slurred speech.  He has a few remaining lower teeth. Breath sounds are decreased.  Pedal pulses are not palpable on the left. The foot is bandaged. He has thickened & deformed toenails of remaining toes. 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  The macerated dermatitis at the base of the glans penis is improved but still present   General appearance: no acute distress , increased work of breathing is present.   Lymphatic: No lymphadenopathy about the head, neck, axilla . Eyes: No conjunctival inflammation or lid edema is present. There is no scleral icterus. Ears:  External ear exam shows no significant lesions or deformities.   Nose:  External nasal examination shows no deformity or inflammation. Nasal mucosa are pink and moist without lesions ,exudates Oral exam: lips and gums are healthy appearing.There is no oropharyngeal erythema or exudate . Neck:  No thyromegaly, masses, tenderness noted.  Heart:  No gallop, murmur, click, rub .  Lungs:without wheezes, rhonchi,rales , rubs. Abdomen:Bowel sounds are normal. Abdomen is soft and nontender with no organomegaly, hernias,masses. Extremities:  No cyanosis,  clubbing,edema  Skin: Warm & dry w/o tenting.  See summary under each active problem in the Problem List with associated updated therapeutic plan

## 2016-11-06 NOTE — Patient Instructions (Signed)
[name] , Thank you for taking time to come for your Medicare Wellness Visit. I appreciate your ongoing commitment to your health goals. Please review the following plan we discussed and let me know if I can assist you in the future.   Screening recommendations/referrals: Colonoscopy excluded, long term pt Recommended yearly ophthalmology/optometry visit for glaucoma screening and checkup Recommended yearly dental visit for hygiene and checkup  Vaccinations: Influenza vaccine up to date. Due 2018 fall season Pneumococcal vaccine 13 due, ordered Tdap vaccine not in records Shingles vaccine not in records  Advanced directives: Need a copy for chart  Conditions/risks identified: None  Next appointment: Dr. Alwyn RenHopper makes rounds  Preventive Care 65 Years and Older, Male Preventive care refers to lifestyle choices and visits with your health care provider that can promote health and wellness. What does preventive care include?  A yearly physical exam. This is also called an annual well check.  Dental exams once or twice a year.  Routine eye exams. Ask your health care provider how often you should have your eyes checked.  Personal lifestyle choices, including:  Daily care of your teeth and gums.  Regular physical activity.  Eating a healthy diet.  Avoiding tobacco and drug use.  Limiting alcohol use.  Practicing safe sex.  Taking low doses of aspirin every day.  Taking vitamin and mineral supplements as recommended by your health care provider. What happens during an annual well check? The services and screenings done by your health care provider during your annual well check will depend on your age, overall health, lifestyle risk factors, and family history of disease. Counseling  Your health care provider may ask you questions about your:  Alcohol use.  Tobacco use.  Drug use.  Emotional well-being.  Home and relationship well-being.  Sexual  activity.  Eating habits.  History of falls.  Memory and ability to understand (cognition).  Work and work Astronome[employer]vironment. Screening  You may have the following tests or measurements:  Height, weight, and BMI.  Blood pressure.  Lipid and cholesterol levels. These may be checked every 5 years, or more frequently if you are over 69 years old.  Skin check.  Lung cancer screening. You may have this screening every year starting at age 69 if you have a 30-pack-year history of smoking and currently smoke or have quit within the past 15 years.  Fecal occult blood test (FOBT) of the stool. You may have this test every year starting at age 69.  Flexible sigmoidoscopy or colonoscopy. You may have a sigmoidoscopy every 5 years or a colonoscopy every 10 years starting at age 450.  Prostate cancer screening. Recommendations will vary depending on your family history and other risks.  Hepatitis C blood test.  Hepatitis B blood test.  Sexually transmitted disease (STD) testing.  Diabetes screening. This is done by checking your blood sugar (glucose) after you have not eaten for a while (fasting). You may have this done every 1-3 years.  Abdominal aortic aneurysm (AAA) screening. You may need this if you are a current or former smoker.  Osteoporosis. You may be screened starting at age 69 if you are at high risk. Talk with your health care provider about your test results, treatment options, and if necessary, the need for more tests. Vaccines  Your health care provider may recommend certain vaccines, such as:  Influenza vaccine. This is recommended every year.  Tetanus, diphtheria, and acellular pertussis (Tdap, Td) vaccine. You may need a Td booster every  10 years.  Zoster vaccine. You may need this after age 76.  Pneumococcal 13-valent conjugate (PCV13) vaccine. One dose is recommended after age 82.  Pneumococcal polysaccharide (PPSV23) vaccine. One dose is recommended after age  87. Talk to your health care provider about which screenings and vaccines you need and how often you need them. This information is not intended to replace advice given to you by your health care provider. Make sure you discuss any questions you have with your health care provider. Document Released: 04/27/2015 Document Revised: 12/19/2015 Document Reviewed: 01/30/2015 Elsevier Interactive Patient Education  2017 [place] Prevention in the Home Falls can cause injuries. They can happen to people of all ages. There are many things you can do to make your home safe and to help prevent falls. What can I do on the outside of my home?  Regularly fix the edges of walkways and driveways and fix any cracks.  Remove anything that might make you trip as you walk through a door, such as a raised step or threshold.  Trim any bushes or trees on the path to your home.  Use bright outdoor lighting.  Clear any walking paths of anything that might make someone trip, such as rocks or tools.  Regularly check to see if handrails are loose or broken. Make sure that both sides of any steps have handrails.  Any raised decks and porches should have guardrails on the edges.  Have any leaves, snow, or ice cleared regularly.  Use sand or salt on walking paths during winter.  Clean up any spills in your garage right away. This includes oil or grease spills. What can I do in the bathroom?  Use night lights.  Install grab bars by the toilet and in the tub and shower. Do not use towel bars as grab bars.  Use non-skid mats or decals in the tub or shower.  If you need to sit down in the shower, use a plastic, non-slip stool.  Keep the floor dry. Clean up any water that spills on the floor as soon as it happens.  Remove soap buildup in the tub or shower regularly.  Attach bath mats securely with double-sided non-slip rug tape.  Do not have throw rugs and other things on the floor that can make  you trip. What can I do in the bedroom?  Use night lights.  Make sure that you have a light by your bed that is easy to reach.  Do not use any sheets or blankets that are too big for your bed. They should not hang down onto the floor.  Have a firm chair that has side arms. You can use this for support while you get dressed.  Do not have throw rugs and other things on the floor that can make you trip. What can I do in the kitchen?  Clean up any spills right away.  Avoid walking on wet floors.  Keep items that you use a lot in easy-to-reach places.  If you need to reach something above you, use a strong step stool that has a grab bar.  Keep electrical cords out of the way.  Do not use floor polish or wax that makes floors slippery. If you must use wax, use non-skid floor wax.  Do not have throw rugs and other things on the floor that can make you trip. What can I do with my stairs?  Do not leave any items on the stairs.  Make sure  that there are handrails on both sides of the stairs and use them. Fix handrails that are broken or loose. Make sure that handrails are as long as the stairways.  Check any carpeting to make sure that it is firmly attached to the stairs. Fix any carpet that is loose or worn.  Avoid having throw rugs at the top or bottom of the stairs. If you do have throw rugs, attach them to the floor with carpet tape.  Make sure that you have a light switch at the top of the stairs and the bottom of the stairs. If you do not have them, ask someone to add them for you. What else can I do to help prevent falls?  Wear shoes that:  Do not have high heels.  Have rubber bottoms.  Are comfortable and fit you well.  Are closed at the toe. Do not wear sandals.  If you use a stepladder:  Make sure that it is fully opened. Do not climb a closed stepladder.  Make sure that both sides of the stepladder are locked into place.  Ask someone to hold it for you, if  possible.  Clearly mark and make sure that you can see:  Any grab bars or handrails.  First and last steps.  Where the edge of each step is.  Use tools that help you move around (mobility aids) if they are needed. These include:  Canes.  Walkers.  Scooters.  Crutches.  Turn on the lights when you go into a dark area. Replace any light bulbs as soon as they burn out.  Set up your furniture so you have a clear path. Avoid moving your furniture around.  If any of your floors are uneven, fix them.  If there are any pets around you, be aware of where they are.  Review your medicines with your doctor. Some medicines can make you feel dizzy. This can increase your chance of falling. Ask your doctor what other things that you can do to help prevent falls. This information is not intended to replace advice given to you by your health care provider. Make sure you discuss any questions you have with your health care provider. Document Released: 01/25/2009 Document Revised: 09/06/2015 Document Reviewed: 05/05/2014 Elsevier Interactive Patient Education  2017 [employer].

## 2016-11-11 ENCOUNTER — Encounter: Payer: Self-pay | Admitting: Adult Health

## 2016-11-11 ENCOUNTER — Ambulatory Visit: Payer: Medicare Other | Admitting: Vascular Surgery

## 2016-11-11 ENCOUNTER — Non-Acute Institutional Stay (SKILLED_NURSING_FACILITY): Payer: Medicare Other | Admitting: Adult Health

## 2016-11-11 ENCOUNTER — Telehealth: Payer: Self-pay | Admitting: *Deleted

## 2016-11-11 DIAGNOSIS — G894 Chronic pain syndrome: Secondary | ICD-10-CM | POA: Diagnosis not present

## 2016-11-11 DIAGNOSIS — I5022 Chronic systolic (congestive) heart failure: Secondary | ICD-10-CM

## 2016-11-11 DIAGNOSIS — Z91199 Patient's noncompliance with other medical treatment and regimen due to unspecified reason: Secondary | ICD-10-CM

## 2016-11-11 DIAGNOSIS — Z9119 Patient's noncompliance with other medical treatment and regimen: Secondary | ICD-10-CM | POA: Diagnosis not present

## 2016-11-11 DIAGNOSIS — R627 Adult failure to thrive: Secondary | ICD-10-CM

## 2016-11-11 NOTE — Telephone Encounter (Signed)
Returned call to BrootenJoAnn, Engineer, civil (consulting)nurse from [facility].  She states that patients left great toe has formed eschar from the tip of toe to the first joint. Stating that there is no odor or drainage.  She was requesting to move patients appointment up from 11/25/2016.  An appointment was scheduled for 11/18/2016.  [facility] was notified.
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
## 2016-11-13 ENCOUNTER — Encounter: Payer: Self-pay | Admitting: Internal Medicine

## 2016-11-13 ENCOUNTER — Non-Acute Institutional Stay (SKILLED_NURSING_FACILITY): Payer: Medicare Other | Admitting: Internal Medicine

## 2016-11-13 DIAGNOSIS — R627 Adult failure to thrive: Secondary | ICD-10-CM

## 2016-11-13 DIAGNOSIS — T8189XA Other complications of procedures, not elsewhere classified, initial encounter: Secondary | ICD-10-CM | POA: Diagnosis not present

## 2016-11-13 DIAGNOSIS — T8789 Other complications of amputation stump: Secondary | ICD-10-CM | POA: Diagnosis not present

## 2016-11-13 DIAGNOSIS — Z9189 Other specified personal risk factors, not elsewhere classified: Secondary | ICD-10-CM

## 2016-11-13 NOTE — Patient Instructions (Addendum)
See assessment and plan under each diagnosis in the problem list and acutely for this visit. Total time 42 minutes; greater than 50% of the visit spent counseling patient and coordinating care for problems addressed at this encounter  11/13/16 #1 prescription written for oxycodone/acetaminophen 7.5 mg /325 mg 1 every 8 hours as needed dispense 30 pills. Note: present dose is 10/325 mg every 4 hours as needed. He has been taking it at that interval and actually requesting administration prior to the 4 hours. #2 Oxycodone 5/325 postdated to 812/18 1 every 8 hours as needed dispense 30. I  plan to decrease the dose to 5/325 every 12 hours as needed after that 11/23/16 prescription has been completed.  I am weaning and discontinuing the narcotics because of the risk of respiratory suppression and death. I am weaning rather than acutely stopping high-dose opioids with potential for acute withdrawal.

## 2016-11-13 NOTE — Progress Notes (Signed)
   [facility] Room: 113A  Code Status:Full Code  This is a nursing facility follow up for specific acute issue of altered mental status  Interim medical record and care since last [facility] visit was updated with review of diagnostic studies and change in clinical status since last visit were documented.  HPI: His Nurse reported the patient was extremely hard to arouse and not verbally interactive and noted to be drooling. She attended the patient yesterday and knew that he did not eat breakfast or lunch. She is unsure about the evening meal. He also did not eat breakfast this morning. He's been demonstrating jerking of extremities. When he could be awakened his speech was slurred. He was unaware that he had not eaten. The SNF Administer attempted to have the patient sign some legal papers. The patient kept falling asleep, dropping the pen. Percocet twenty tablets had been prescribed by his vascular surgeon, but the patient had been taking this every 4 hours. The emergency [facility] supply of narcotics had to be accessed because he completed the Rx in < 4 days. [facility] and oxycodone 10 ordered every 4 hours. The patient had been taking this on a regular basis and in fact had been requesting administration prior to the 4 hour interval. As is documented in the chart the patient has received Narcan on 5 occasions for profound somnolence and hypopnea.  Review of systems:  When he can be awakened, he voices no complaints other than pain in the foot  Physical exam:  Pertinent or positive findings:Upon awakening to some degree he looked around the room blankly and slowly waved his arms. His speech was markedly slurred and a whisper. He was unable to give me the day or month. He did slowly reply that the date was "18". He is grossly cachectic with diffuse muscle wasting, most visible over the thorax and temples. Heart  sounds are distant and slow. Breath sounds are decreased. Bowel sounds are also decreased. BKA on the right. The remaining toenails on the left are thickened and distorted. The foot is dressed.   General appearance: no acute distress , increased work of breathing is present.   Lymphatic: No lymphadenopathy about the head, neck, axilla . Eyes: No conjunctival inflammation or lid edema is present. There is no scleral icterus.Ptosis bilaterally Ears:  External ear exam shows no significant lesions or deformities.   Nose:  External nasal examination shows no deformity or inflammation. Nasal mucosa are pink and moist without lesions ,exudates Oral exam: lips and gums are healthy appearing. Neck:  No thyromegaly, masses, tenderness noted.    Heart:  No murmur, click, rub .  Lungs: without wheezes, rhonchi,rales , rubs. Abdomen: Abdomen is soft and nontender with no organomegaly, hernias,masses. GU: deferred  Extremities:  No LLE edema  Skin: Warm & dry w/o tenting. No significant  rash.  See summary under each active problem in the Problem List with associated updated therapeutic plan

## 2016-11-13 NOTE — Assessment & Plan Note (Signed)
11/13/16 multifactorial but mainly due to protein malnutrition and peripheral vascular disease patient is noncompliant with nutrition, mobilization and medication regimen. He essentially sleeps almost all the time, waking only to request pain medication and requesting that prior to the 6 hour interval. I had a frank discussion with him about inability to heal without proper nutrition. I informed him that without addressing the protein deficit and other co-morbidities (DM,CHF etc), the process is terminal. I gave him the option of Hospice referral if he can not or does not want to  Pursue recommended therapeutic options. Palliative Care consult

## 2016-11-13 NOTE — Assessment & Plan Note (Addendum)
See entry under  Delayed Surgical Wound Healing

## 2016-11-13 NOTE — Assessment & Plan Note (Addendum)
11/13/16  Although patient is profoundly lethargic , he can be aroused and oxygen saturations are adequate.  Because of the risk of opioid-induced apnea which has occurred on several occasions, the narcotics will be weaned. I'll write specific orders and request that no other providers be called to fill the narcotics.

## 2016-11-17 ENCOUNTER — Encounter (HOSPITAL_COMMUNITY): Payer: Self-pay | Admitting: Emergency Medicine

## 2016-11-17 ENCOUNTER — Emergency Department (HOSPITAL_COMMUNITY): Payer: Medicare Other

## 2016-11-17 ENCOUNTER — Inpatient Hospital Stay (HOSPITAL_COMMUNITY)
Admission: EM | Admit: 2016-11-17 | Discharge: 2016-11-23 | DRG: 474 | Disposition: A | Discharge status: Skilled Nursing Facility | Payer: Medicare Other | Attending: Internal Medicine | Admitting: Internal Medicine

## 2016-11-17 ENCOUNTER — Inpatient Hospital Stay (HOSPITAL_COMMUNITY): Payer: Medicare Other

## 2016-11-17 ENCOUNTER — Encounter: Payer: Self-pay | Admitting: Family

## 2016-11-17 DIAGNOSIS — E1122 Type 2 diabetes mellitus with diabetic chronic kidney disease: Secondary | ICD-10-CM | POA: Diagnosis present

## 2016-11-17 DIAGNOSIS — E43 Unspecified severe protein-calorie malnutrition: Secondary | ICD-10-CM | POA: Diagnosis present

## 2016-11-17 DIAGNOSIS — K5900 Constipation, unspecified: Secondary | ICD-10-CM | POA: Diagnosis present

## 2016-11-17 DIAGNOSIS — Z89512 Acquired absence of left leg below knee: Secondary | ICD-10-CM | POA: Diagnosis not present

## 2016-11-17 DIAGNOSIS — E861 Hypovolemia: Secondary | ICD-10-CM | POA: Diagnosis present

## 2016-11-17 DIAGNOSIS — Z79891 Long term (current) use of opiate analgesic: Secondary | ICD-10-CM

## 2016-11-17 DIAGNOSIS — Z89519 Acquired absence of unspecified leg below knee: Secondary | ICD-10-CM | POA: Diagnosis not present

## 2016-11-17 DIAGNOSIS — I480 Paroxysmal atrial fibrillation: Secondary | ICD-10-CM | POA: Diagnosis present

## 2016-11-17 DIAGNOSIS — E872 Acidosis: Secondary | ICD-10-CM | POA: Diagnosis present

## 2016-11-17 DIAGNOSIS — E1151 Type 2 diabetes mellitus with diabetic peripheral angiopathy without gangrene: Secondary | ICD-10-CM | POA: Diagnosis not present

## 2016-11-17 DIAGNOSIS — Z833 Family history of diabetes mellitus: Secondary | ICD-10-CM

## 2016-11-17 DIAGNOSIS — Y835 Amputation of limb(s) as the cause of abnormal reaction of the patient, or of later complication, without mention of misadventure at the time of the procedure: Secondary | ICD-10-CM | POA: Diagnosis present

## 2016-11-17 DIAGNOSIS — Z794 Long term (current) use of insulin: Secondary | ICD-10-CM

## 2016-11-17 DIAGNOSIS — E876 Hypokalemia: Secondary | ICD-10-CM | POA: Diagnosis not present

## 2016-11-17 DIAGNOSIS — A419 Sepsis, unspecified organism: Secondary | ICD-10-CM | POA: Diagnosis not present

## 2016-11-17 DIAGNOSIS — I5042 Chronic combined systolic (congestive) and diastolic (congestive) heart failure: Secondary | ICD-10-CM | POA: Diagnosis present

## 2016-11-17 DIAGNOSIS — E860 Dehydration: Secondary | ICD-10-CM | POA: Diagnosis present

## 2016-11-17 DIAGNOSIS — D649 Anemia, unspecified: Secondary | ICD-10-CM | POA: Diagnosis present

## 2016-11-17 DIAGNOSIS — E039 Hypothyroidism, unspecified: Secondary | ICD-10-CM | POA: Diagnosis not present

## 2016-11-17 DIAGNOSIS — Z8673 Personal history of transient ischemic attack (TIA), and cerebral infarction without residual deficits: Secondary | ICD-10-CM | POA: Diagnosis not present

## 2016-11-17 DIAGNOSIS — Z6820 Body mass index (BMI) 20.0-20.9, adult: Secondary | ICD-10-CM | POA: Diagnosis not present

## 2016-11-17 DIAGNOSIS — I959 Hypotension, unspecified: Secondary | ICD-10-CM | POA: Diagnosis present

## 2016-11-17 DIAGNOSIS — I70262 Atherosclerosis of native arteries of extremities with gangrene, left leg: Secondary | ICD-10-CM | POA: Diagnosis present

## 2016-11-17 DIAGNOSIS — G8918 Other acute postprocedural pain: Secondary | ICD-10-CM

## 2016-11-17 DIAGNOSIS — E1142 Type 2 diabetes mellitus with diabetic polyneuropathy: Secondary | ICD-10-CM | POA: Diagnosis present

## 2016-11-17 DIAGNOSIS — I96 Gangrene, not elsewhere classified: Secondary | ICD-10-CM | POA: Diagnosis present

## 2016-11-17 DIAGNOSIS — F1010 Alcohol abuse, uncomplicated: Secondary | ICD-10-CM | POA: Diagnosis present

## 2016-11-17 DIAGNOSIS — G9340 Encephalopathy, unspecified: Secondary | ICD-10-CM | POA: Diagnosis present

## 2016-11-17 DIAGNOSIS — Z8249 Family history of ischemic heart disease and other diseases of the circulatory system: Secondary | ICD-10-CM

## 2016-11-17 DIAGNOSIS — I130 Hypertensive heart and chronic kidney disease with heart failure and stage 1 through stage 4 chronic kidney disease, or unspecified chronic kidney disease: Secondary | ICD-10-CM | POA: Diagnosis present

## 2016-11-17 DIAGNOSIS — I739 Peripheral vascular disease, unspecified: Secondary | ICD-10-CM | POA: Diagnosis not present

## 2016-11-17 DIAGNOSIS — D62 Acute posthemorrhagic anemia: Secondary | ICD-10-CM

## 2016-11-17 DIAGNOSIS — I10 Essential (primary) hypertension: Secondary | ICD-10-CM | POA: Diagnosis not present

## 2016-11-17 DIAGNOSIS — Z89611 Acquired absence of right leg above knee: Secondary | ICD-10-CM | POA: Diagnosis not present

## 2016-11-17 DIAGNOSIS — Z9981 Dependence on supplemental oxygen: Secondary | ICD-10-CM

## 2016-11-17 DIAGNOSIS — R000 Tachycardia, unspecified: Secondary | ICD-10-CM | POA: Diagnosis present

## 2016-11-17 DIAGNOSIS — M86172 Other acute osteomyelitis, left ankle and foot: Secondary | ICD-10-CM | POA: Diagnosis present

## 2016-11-17 DIAGNOSIS — N179 Acute kidney failure, unspecified: Secondary | ICD-10-CM | POA: Diagnosis present

## 2016-11-17 DIAGNOSIS — E875 Hyperkalemia: Secondary | ICD-10-CM | POA: Diagnosis present

## 2016-11-17 DIAGNOSIS — Z87891 Personal history of nicotine dependence: Secondary | ICD-10-CM

## 2016-11-17 DIAGNOSIS — E1152 Type 2 diabetes mellitus with diabetic peripheral angiopathy with gangrene: Secondary | ICD-10-CM | POA: Diagnosis present

## 2016-11-17 DIAGNOSIS — Z885 Allergy status to narcotic agent status: Secondary | ICD-10-CM

## 2016-11-17 DIAGNOSIS — Z79899 Other long term (current) drug therapy: Secondary | ICD-10-CM

## 2016-11-17 DIAGNOSIS — E1169 Type 2 diabetes mellitus with other specified complication: Secondary | ICD-10-CM | POA: Diagnosis present

## 2016-11-17 DIAGNOSIS — Z888 Allergy status to other drugs, medicaments and biological substances status: Secondary | ICD-10-CM

## 2016-11-17 DIAGNOSIS — Z951 Presence of aortocoronary bypass graft: Secondary | ICD-10-CM | POA: Diagnosis not present

## 2016-11-17 DIAGNOSIS — K7030 Alcoholic cirrhosis of liver without ascites: Secondary | ICD-10-CM | POA: Diagnosis present

## 2016-11-17 DIAGNOSIS — N183 Chronic kidney disease, stage 3 (moderate): Secondary | ICD-10-CM | POA: Diagnosis present

## 2016-11-17 DIAGNOSIS — Z5320 Procedure and treatment not carried out because of patient's decision for unspecified reasons: Secondary | ICD-10-CM | POA: Diagnosis present

## 2016-11-17 DIAGNOSIS — Z79890 Hormone replacement therapy (postmenopausal): Secondary | ICD-10-CM

## 2016-11-17 DIAGNOSIS — I2510 Atherosclerotic heart disease of native coronary artery without angina pectoris: Secondary | ICD-10-CM | POA: Diagnosis present

## 2016-11-17 DIAGNOSIS — I340 Nonrheumatic mitral (valve) insufficiency: Secondary | ICD-10-CM | POA: Diagnosis present

## 2016-11-17 DIAGNOSIS — K7460 Unspecified cirrhosis of liver: Secondary | ICD-10-CM | POA: Diagnosis not present

## 2016-11-17 DIAGNOSIS — Z8619 Personal history of other infectious and parasitic diseases: Secondary | ICD-10-CM | POA: Diagnosis not present

## 2016-11-17 DIAGNOSIS — E8809 Other disorders of plasma-protein metabolism, not elsewhere classified: Secondary | ICD-10-CM | POA: Diagnosis present

## 2016-11-17 DIAGNOSIS — D638 Anemia in other chronic diseases classified elsewhere: Secondary | ICD-10-CM

## 2016-11-17 DIAGNOSIS — Z7982 Long term (current) use of aspirin: Secondary | ICD-10-CM

## 2016-11-17 DIAGNOSIS — Z89422 Acquired absence of other left toe(s): Secondary | ICD-10-CM

## 2016-11-17 DIAGNOSIS — T8789 Other complications of amputation stump: Secondary | ICD-10-CM | POA: Diagnosis present

## 2016-11-17 LAB — COMPREHENSIVE METABOLIC PANEL
ALT: 10 U/L — AB (ref 17–63)
ANION GAP: 22 — AB (ref 5–15)
AST: 17 U/L (ref 15–41)
Albumin: 2.6 g/dL — ABNORMAL LOW (ref 3.5–5.0)
Alkaline Phosphatase: 186 U/L — ABNORMAL HIGH (ref 38–126)
BUN: 202 mg/dL — AB (ref 6–20)
CALCIUM: 9.2 mg/dL (ref 8.9–10.3)
CHLORIDE: 100 mmol/L — AB (ref 101–111)
CO2: 11 mmol/L — AB (ref 22–32)
Creatinine, Ser: 8.48 mg/dL — ABNORMAL HIGH (ref 0.61–1.24)
GFR, EST AFRICAN AMERICAN: 7 mL/min — AB (ref >60)
GFR, EST NON AFRICAN AMERICAN: 6 mL/min — AB (ref >60)
Glucose, Bld: 142 mg/dL — ABNORMAL HIGH (ref 65–99)
SODIUM: 133 mmol/L — AB (ref 135–145)
TOTAL PROTEIN: 8.8 g/dL — AB (ref 6.5–8.1)
Total Bilirubin: 1.3 mg/dL — ABNORMAL HIGH (ref 0.3–1.2)

## 2016-11-17 LAB — BASIC METABOLIC PANEL
Anion gap: 18 — ABNORMAL HIGH (ref 5–15)
BUN: 197 mg/dL — AB (ref 6–20)
CO2: 12 mmol/L — ABNORMAL LOW (ref 22–32)
CREATININE: 7.92 mg/dL — AB (ref 0.61–1.24)
Calcium: 8.6 mg/dL — ABNORMAL LOW (ref 8.9–10.3)
Chloride: 101 mmol/L (ref 101–111)
GFR calc Af Amer: 7 mL/min — ABNORMAL LOW (ref >60)
GFR, EST NON AFRICAN AMERICAN: 6 mL/min — AB (ref >60)
Glucose, Bld: 292 mg/dL — ABNORMAL HIGH (ref 65–99)
Potassium: 6.6 mmol/L (ref 3.5–5.1)
SODIUM: 131 mmol/L — AB (ref 135–145)

## 2016-11-17 LAB — CBC WITH DIFFERENTIAL/PLATELET
BASOS ABS: 0 10*3/uL (ref 0.0–0.1)
BASOS PCT: 0 %
EOS PCT: 0 %
Eosinophils Absolute: 0 10*3/uL (ref 0.0–0.7)
HCT: 39.4 % (ref 39.0–52.0)
Hemoglobin: 12.5 g/dL — ABNORMAL LOW (ref 13.0–17.0)
LYMPHS PCT: 7 %
Lymphs Abs: 1.3 10*3/uL (ref 0.7–4.0)
MCH: 27.7 pg (ref 26.0–34.0)
MCHC: 31.7 g/dL (ref 30.0–36.0)
MCV: 87.2 fL (ref 78.0–100.0)
Monocytes Absolute: 1.1 10*3/uL — ABNORMAL HIGH (ref 0.1–1.0)
Monocytes Relative: 6 %
NEUTROS ABS: 17.4 10*3/uL — AB (ref 1.7–7.7)
Neutrophils Relative %: 87 %
PLATELETS: 332 10*3/uL (ref 150–400)
RBC: 4.52 MIL/uL (ref 4.22–5.81)
RDW: 20.1 % — ABNORMAL HIGH (ref 11.5–15.5)
WBC: 19.8 10*3/uL — AB (ref 4.0–10.5)

## 2016-11-17 LAB — I-STAT VENOUS BLOOD GAS, ED
Acid-base deficit: 13 mmol/L — ABNORMAL HIGH (ref 0.0–2.0)
Bicarbonate: 15.1 mmol/L — ABNORMAL LOW (ref 20.0–28.0)
O2 Saturation: 73 %
PCO2 VEN: 40.7 mmHg — AB (ref 44.0–60.0)
PH VEN: 7.177 — AB (ref 7.250–7.430)
TCO2: 16 mmol/L (ref 0–100)
pO2, Ven: 48 mmHg — ABNORMAL HIGH (ref 32.0–45.0)

## 2016-11-17 LAB — CBG MONITORING, ED: GLUCOSE-CAPILLARY: 266 mg/dL — AB (ref 65–99)

## 2016-11-17 LAB — I-STAT CG4 LACTIC ACID, ED
LACTIC ACID, VENOUS: 1.43 mmol/L (ref 0.5–1.9)
LACTIC ACID, VENOUS: 0.93 mmol/L (ref 0.5–1.9)

## 2016-11-17 MED ORDER — INSULIN ASPART 100 UNIT/ML IV SOLN
10.0000 [IU] | Freq: Once | INTRAVENOUS | Status: AC
  Administered 2016-11-17: 10 [IU] via INTRAVENOUS
  Filled 2016-11-17: qty 0.1

## 2016-11-17 MED ORDER — INSULIN ASPART 100 UNIT/ML ~~LOC~~ SOLN
2.0000 [IU] | SUBCUTANEOUS | Status: DC
  Administered 2016-11-18: 2 [IU] via SUBCUTANEOUS

## 2016-11-17 MED ORDER — ALBUTEROL (5 MG/ML) CONTINUOUS INHALATION SOLN
10.0000 mg/h | INHALATION_SOLUTION | RESPIRATORY_TRACT | Status: DC
  Administered 2016-11-17: 10 mg/h via RESPIRATORY_TRACT
  Filled 2016-11-17: qty 20

## 2016-11-17 MED ORDER — HEPARIN SODIUM (PORCINE) 5000 UNIT/ML IJ SOLN
5000.0000 [IU] | Freq: Three times a day (TID) | INTRAMUSCULAR | Status: DC
  Administered 2016-11-18 – 2016-11-20 (×8): 5000 [IU] via SUBCUTANEOUS
  Filled 2016-11-17 (×9): qty 1

## 2016-11-17 MED ORDER — SODIUM CHLORIDE 0.9 % IV SOLN
1.0000 g | Freq: Once | INTRAVENOUS | Status: AC
  Administered 2016-11-17: 1 g via INTRAVENOUS
  Filled 2016-11-17: qty 10

## 2016-11-17 MED ORDER — PIPERACILLIN-TAZOBACTAM 3.375 G IVPB 30 MIN
3.3750 g | Freq: Once | INTRAVENOUS | Status: AC
  Administered 2016-11-17: 3.375 g via INTRAVENOUS
  Filled 2016-11-17: qty 50

## 2016-11-17 MED ORDER — DEXTROSE 50 % IV SOLN
1.0000 | Freq: Once | INTRAVENOUS | Status: AC
  Administered 2016-11-17: 50 mL via INTRAVENOUS
  Filled 2016-11-17: qty 50

## 2016-11-17 MED ORDER — SODIUM CHLORIDE 0.9 % IV BOLUS (SEPSIS)
1000.0000 mL | Freq: Once | INTRAVENOUS | Status: AC
Start: 2016-11-17 — End: 2016-11-17
  Administered 2016-11-17: 1000 mL via INTRAVENOUS

## 2016-11-17 MED ORDER — PIPERACILLIN-TAZOBACTAM IN DEX 2-0.25 GM/50ML IV SOLN
2.2500 g | Freq: Three times a day (TID) | INTRAVENOUS | Status: DC
  Administered 2016-11-18 – 2016-11-19 (×5): 2.25 g via INTRAVENOUS
  Filled 2016-11-17 (×7): qty 50

## 2016-11-17 MED ORDER — VANCOMYCIN HCL IN DEXTROSE 1-5 GM/200ML-% IV SOLN
1000.0000 mg | Freq: Once | INTRAVENOUS | Status: AC
  Administered 2016-11-17: 1000 mg via INTRAVENOUS
  Filled 2016-11-17: qty 200

## 2016-11-17 MED ORDER — STERILE WATER FOR INJECTION IV SOLN
INTRAVENOUS | Status: DC
  Administered 2016-11-17 – 2016-11-19 (×5): via INTRAVENOUS
  Filled 2016-11-17 (×11): qty 850

## 2016-11-17 MED ORDER — PANTOPRAZOLE SODIUM 40 MG IV SOLR
40.0000 mg | INTRAVENOUS | Status: DC
  Administered 2016-11-18: 40 mg via INTRAVENOUS
  Filled 2016-11-17: qty 40

## 2016-11-17 MED ORDER — LEVOTHYROXINE SODIUM 100 MCG IV SOLR
12.5000 ug | Freq: Every day | INTRAVENOUS | Status: DC
  Administered 2016-11-18: 12.5 ug via INTRAVENOUS
  Filled 2016-11-17: qty 5

## 2016-11-17 MED ORDER — SODIUM POLYSTYRENE SULFONATE 15 GM/60ML PO SUSP
50.0000 g | ORAL | Status: DC
  Administered 2016-11-17: 50 g via ORAL
  Filled 2016-11-17: qty 240

## 2016-11-17 MED ORDER — ALBUTEROL SULFATE (2.5 MG/3ML) 0.083% IN NEBU: 10.0000 mg | INHALATION_SOLUTION | Freq: Once | RESPIRATORY_TRACT | Status: DC

## 2016-11-17 MED ORDER — INSULIN ASPART 100 UNIT/ML ~~LOC~~ SOLN
SUBCUTANEOUS | Status: AC
  Filled 2016-11-17: qty 1

## 2016-11-17 MED ORDER — SODIUM CHLORIDE 0.9 % IV BOLUS (SEPSIS)
1000.0000 mL | Freq: Once | INTRAVENOUS | Status: AC
  Administered 2016-11-17: 1000 mL via INTRAVENOUS

## 2016-11-17 MED ORDER — SODIUM POLYSTYRENE SULFONATE 15 GM/60ML PO SUSP
50.0000 g | ORAL | Status: AC
  Administered 2016-11-18 (×2): 50 g via RECTAL
  Filled 2016-11-17 (×3): qty 240

## 2016-11-17 MED ORDER — SODIUM CHLORIDE 0.9 % IV SOLN
0.0000 ug/min | INTRAVENOUS | Status: DC
  Filled 2016-11-17 (×2): qty 1

## 2016-11-17 MED ORDER — SODIUM CHLORIDE 0.9 % IV SOLN
250.0000 mL | INTRAVENOUS | Status: DC | PRN
  Administered 2016-11-22: 250 mL via INTRAVENOUS

## 2016-11-17 NOTE — Progress Notes (Signed)
Pharmacy Antibiotic Note  [name] Wieck is a 71 y.o. male admitted on 11/17/2016 with sepsis.  Pharmacy has been consulted for vancomycin and zosyn dosing.  Pt presents with acute on chronic renal dysfunction and a history of Recent hospitalization 7/6-7/12 with gangrene in left foot s/p 4th+5th and 3rd toe amputation.   Plan: Zosyn 2.25 g IV q8h Vancomycin 1 g IV x 1 and adjust regimen as CrCl changes F/u kidney function and adjust abx as needed   Height: 5\' 9"  (175.3 cm) Weight: 140 lb (63.5 kg) IBW/kg (Calculated) : 70.7  Temp (24hrs), Avg:96.5 F (35.8 C), Min:96.5 F (35.8 C), Max:96.5 F (35.8 C)   Recent Labs Lab 11/17/16 1755 11/17/16 1809  WBC 19.8*  --   CREATININE 8.48*  --   LATICACIDVEN  --  1.43    Estimated Creatinine Clearance: 7.4 mL/min (A) (by C-G formula based on SCr of 8.48 mg/dL (H)).    Allergies  Allergen Reactions  . Hydralazine Other (See Comments)    4/21-4/26/18 pleuritic chest pain with bilateral effusions and pericardial rub. Rule out hydralazine drug-induced lupus   . Percocet [Oxycodone-Acetaminophen] Other (See Comments)    3 episodes of unresponsiveness & apnea; he has received a total of 5 doses of Narcan to date for resusitation ( [facility] & [facility]) Hx of drug abuse (cocaine, canabis,alcohol, tobacco) Opiods CONTRAINDICATED !!!   . Tramadol Other (See Comments)    Excess sedation; ? Related to pre-existing hepatic dysfunction with PMH Hep C  . Remeron [Mirtazapine]     Antimicrobials this admission: 8/6 vanc> 8/6 zosyn>  Dose adjustments this admission: n/a  Microbiology results: 8/6 BCx: sent   Daylene PoseyJonathan Deshawn Skelley, PharmD Pharmacy Resident Pager #: [phone] 11/17/2016 8:22 PM

## 2016-11-17 NOTE — ED Notes (Signed)
Pt spoke with family member on phone.

## 2016-11-17 NOTE — Consult Note (Signed)
HPI: I was asked by Dr. Gustavus Messing to see Ronald Simpson who is a 72 y.o. male recently discharged to a SNF after the 3rd left toe was amputated on 10/20/16.  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  During that July admission there was AKI with a K of 7.5  creat up to 3.53 dropping to 0.79. Baseline creat is about 1.2  At the SNF he had issues with diff voiding and received inadvertently high doses of narcotics. At the SNF, according to niece, he had decreased PO intake over last several days and depressed mentation.  He was transferred from the SNF to ED with weakness, BP systolic in 03J upon arrival to ED, and c/o pain "all over" BUN was 202, creat 8.48 and K > 7.5.  Hgb was 12.5g compared with 9/7g on 10/23/16.  WBC 19,800 w/L shift.   Past Medical History:  Diagnosis Date  . CAD (coronary artery disease)    a. remote CABG.  . Chronic anemia   . Chronic combined systolic and diastolic CHF (congestive heart failure) ([place])   . Cirrhosis ([place])   . CKD (chronic kidney disease), stage III   . CVA (cerebral infarction)   . DM (diabetes mellitus), type 2 with peripheral vascular complications ([place])   . History of hepatitis C 1990s   Hep B immune  . Hypoalbuminemia   . IDDM (insulin dependent diabetes mellitus) ([place])   . Mitral regurgitation    a. s/p prior ring annuloplasty prosthesis per echo with mod MR by echo 07/2016.  Marland Kitchen PAF (paroxysmal atrial fibrillation) ([facility])    a. not on anticoagulation likely due to h/o IC hemorrhage, prior GIB, cirrhosis. b. suspected prior LAA clipping based on imaging.  . Peripheral neuropathy   . Peripheral vascular disease ([place])   . Proteinuria    Past Surgical History:  Procedure Laterality Date  . AMPUTATION Right 01/07/2016   Procedure: AMPUTATION ABOVE KNEE;  Surgeon: Rosetta Posner, MD;  Location: [place];  Service: Vascular;  Laterality: Right;  . AMPUTATION Left 04/04/2016   Procedure: LEFT FOURTH AND FIFTH  TOE  AMPUTATION;  Surgeon: Rosetta Posner, MD;  Location: [place];  Service: Vascular;  Laterality: Left;  . BELOW KNEE LEG AMPUTATION Right   . CORONARY ARTERY BYPASS GRAFT  2005  . ESOPHAGOGASTRODUODENOSCOPY N/A 05/17/2016   Procedure: ESOPHAGOGASTRODUODENOSCOPY (EGD);  Surgeon: Ladene Artist, MD;  Location: [facility] ENDOSCOPY;  Service: Endoscopy;  Laterality: N/A;  . IR GENERIC HISTORICAL  07/07/2016   IR PARACENTESIS 07/07/2016 Ardis Rowan, PA-C [facility]-INTERV RAD  . IR THORACENTESIS ASP PLEURAL SPACE W/IMG GUIDE  08/04/2016  . PERIPHERAL VASCULAR CATHETERIZATION N/A 01/11/2016   Procedure: Lower Extremity Angiography;  Surgeon: Serafina Mitchell, MD;  Location: [place] CV LAB;  Service: Cardiovascular;  Laterality: N/A;  . STERNOTOMY    . TRANSMETATARSAL AMPUTATION Left 10/20/2016   Procedure: LEFT THIRD TOE  AMPUTATION;  Surgeon: Rosetta Posner, MD;  Location: [place];  Service: Vascular;  Laterality: Left;   Social History:  reports that he quit smoking about 6 months ago. His smoking use included Cigarettes. He has a 5.40 pack-year smoking history. He has never used smokeless tobacco. He reports that he drinks alcohol. He reports that he uses drugs, including Cocaine and Marijuana. Allergies:  Allergies  Allergen Reactions  . Hydralazine Other (See Comments)    4/21-4/26/18 pleuritic chest pain with bilateral effusions and pericardial rub. Rule out  hydralazine drug-induced lupus   . Percocet [Oxycodone-Acetaminophen] Other (See Comments)    3 episodes of unresponsiveness & apnea; he has received a total of 5 doses of Narcan to date for resusitation ( 2 in [facility] & 3 @ [facility]) Hx of drug abuse (cocaine, canabis,alcohol, tobacco) Opiods CONTRAINDICATED !!!   . Tramadol Other (See Comments)    Excess sedation; ? Related to pre-existing hepatic dysfunction with PMH Hep C  . Remeron [Mirtazapine]    Family History  Problem Relation Age of Onset  . Hypertension Other   . Diabetes Other   .  Mental illness Other   . Lupus Other   . Diabetes Father   . Cancer Neg Hx   . Heart disease Neg Hx   . Stroke Neg Hx     Medications:  Prior to Admission:  (Not in a hospital admission) Scheduled: . heparin  5,000 Units Subcutaneous Q8H  . insulin aspart  2-6 Units Subcutaneous Q4H  . sodium polystyrene  50 g Oral Q2H    ROS: Difficult to obtain atr this time Blood pressure (!) 84/54, pulse 83, temperature (!) 96.5 F (35.8 C), temperature source Rectal, resp. rate 18, height 5' 9"  (1.753 m), weight 63.5 kg (140 lb), SpO2 100 %.  General appearance: cooperative and appears stated age Head: symmetric shape, atraumatic Eyes: negative Nose: Nares normal. Septum midline. Mucosa normal. No drainage or sinus tenderness. Throat dry mouth Resp: clear to auscultation bilaterally Chest wall: wnl Cardio: regular rate and rhythm, S1, S2 normal, no murmur, click, rub or gallop GI: scapphoid, mild RUQ and suprapubic tenderness Extremities: right AKA, missing 3 toes and 3rd one packed, great toe dark red ischemic appearing and very tender Skin: Skin color, texture, & turgor is significantly reduced. No rashes. Tattoos present Neurologic: Grossly normal  lethargic Results for orders placed or performed during the hospital encounter of 11/17/16 (from the past 48 hour(s))  Comprehensive metabolic panel     Status: Abnormal   Collection Time: 11/17/16  5:55 PM  Result Value Ref Range   Sodium 133 (L) 135 - 145 mmol/L   Potassium >7.5 (HH) 3.5 - 5.1 mmol/L    Comment: NO VISIBLE HEMOLYSIS CRITICAL RESULT CALLED TO, READ BACK BY AND VERIFIED WITH: E.OLSEN,RN 11/17/16 @ [address]    Chloride 100 (L) 101 - 111 mmol/L   CO2 11 (L) 22 - 32 mmol/L   Glucose, Bld 142 (H) 65 - 99 mg/dL   BUN 202 (H) 6 - 20 mg/dL   Creatinine, Ser 8.48 (H) 0.61 - 1.24 mg/dL   Calcium 9.2 8.9 - 10.3 mg/dL   Total Protein 8.8 (H) 6.5 - 8.1 g/dL   Albumin 2.6 (L) 3.5 - 5.0 g/dL   AST 17 15 - 41 U/L   ALT  10 (L) 17 - 63 U/L   Alkaline Phosphatase 186 (H) 38 - 126 U/L   Total Bilirubin 1.3 (H) 0.3 - 1.2 mg/dL   GFR calc non Af Amer 6 (L) >60 mL/min   GFR calc Af Amer 7 (L) >60 mL/min    Comment: (NOTE) The eGFR has been calculated using the CKD EPI equation. This calculation has not been validated in all clinical situations. eGFR's persistently <60 mL/min signify possible Chronic Kidney Disease.    Anion gap 22 (H) 5 - 15  CBC with Differential     Status: Abnormal   Collection Time: 11/17/16  5:55 PM  Result Value Ref Range   WBC 19.8 (H) 4.0 -  10.5 K/uL   RBC 4.52 4.22 - 5.81 MIL/uL   Hemoglobin 12.5 (L) 13.0 - 17.0 g/dL   HCT 39.4 39.0 - 52.0 %   MCV 87.2 78.0 - 100.0 fL   MCH 27.7 26.0 - 34.0 pg   MCHC 31.7 30.0 - 36.0 g/dL   RDW 20.1 (H) 11.5 - 15.5 %   Platelets 332 150 - 400 K/uL   Neutrophils Relative % 87 %   Neutro Abs 17.4 (H) 1.7 - 7.7 K/uL   Lymphocytes Relative 7 %   Lymphs Abs 1.3 0.7 - 4.0 K/uL   Monocytes Relative 6 %   Monocytes Absolute 1.1 (H) 0.1 - 1.0 K/uL   Eosinophils Relative 0 %   Eosinophils Absolute 0.0 0.0 - 0.7 K/uL   Basophils Relative 0 %   Basophils Absolute 0.0 0.0 - 0.1 K/uL  I-Stat CG4 Lactic Acid, ED     Status: None   Collection Time: 11/17/16  6:09 PM  Result Value Ref Range   Lactic Acid, Venous 1.43 0.5 - 1.9 mmol/L  CBG monitoring, ED     Status: Abnormal   Collection Time: 11/17/16  8:42 PM  Result Value Ref Range   Glucose-Capillary 266 (H) 65 - 99 mg/dL  Basic metabolic panel     Status: Abnormal   Collection Time: 11/17/16  8:45 PM  Result Value Ref Range   Sodium 131 (L) 135 - 145 mmol/L   Potassium 6.6 (HH) 3.5 - 5.1 mmol/L    Comment: CRITICAL RESULT CALLED TO, READ BACK BY AND VERIFIED WITH: OLSEN E,RN 11/17/16 2127 WAYK    Chloride 101 101 - 111 mmol/L   CO2 12 (L) 22 - 32 mmol/L   Glucose, Bld 292 (H) 65 - 99 mg/dL   BUN 197 (H) 6 - 20 mg/dL   Creatinine, Ser 7.92 (H) 0.61 - 1.24 mg/dL   Calcium 8.6 (L) 8.9 -  10.3 mg/dL   GFR calc non Af Amer 6 (L) >60 mL/min   GFR calc Af Amer 7 (L) >60 mL/min    Comment: (NOTE) The eGFR has been calculated using the CKD EPI equation. This calculation has not been validated in all clinical situations. eGFR's persistently <60 mL/min signify possible Chronic Kidney Disease.    Anion gap 18 (H) 5 - 15  I-Stat CG4 Lactic Acid, ED  (not at  [facility])     Status: None   Collection Time: 11/17/16  8:50 PM  Result Value Ref Range   Lactic Acid, Venous 0.93 0.5 - 1.9 mmol/L  I-Stat Venous Blood Gas, ED (order at [facility] and [facility] only)     Status: Abnormal   Collection Time: 11/17/16  8:50 PM  Result Value Ref Range   pH, Ven 7.177 (LL) 7.250 - 7.430   pCO2, Ven 40.7 (L) 44.0 - 60.0 mmHg   pO2, Ven 48.0 (H) 32.0 - 45.0 mmHg   Bicarbonate 15.1 (L) 20.0 - 28.0 mmol/L   TCO2 16 0 - 100 mmol/L   O2 Saturation 73.0 %   Acid-base deficit 13.0 (H) 0.0 - 2.0 mmol/L   Patient temperature HIDE    Sample type VENOUS    Comment NOTIFIED PHYSICIAN    Dg Chest Port 1 View  Result Date: 11/17/2016 CLINICAL DATA:  Hypotension. EXAM: PORTABLE CHEST 1 VIEW COMPARISON:  Radiograph of Sep 02, 2016. FINDINGS: The heart size and mediastinal contours are within normal limits. Status post cardiac valve repair. No pneumothorax or pleural effusion is noted. Left lung is clear.  Nodular density is seen in right lung base. No consolidative process is noted. The visualized skeletal structures are unremarkable. IMPRESSION: Nodular density seen in right lung base which may represent overlying nipple shadow repeat radiograph with nipple markers is recommended to rule out pulmonary nodule. No other acute abnormality is noted. Electronically Signed   By: Marijo Conception, M.D.   On: 11/17/2016 19:36   Dg Foot Complete Left  Result Date: 11/17/2016 CLINICAL DATA:  History of gangrene of left foot and toes and status post recent left third toe amputation on 10/20/2016. Left foot wound. History of diabetes. EXAM:  LEFT FOOT - COMPLETE 3+ VIEW COMPARISON:  10/17/2016 FINDINGS: Interval metatarsal amputation of the third toe. AP view shows focal erosion involving the lateral aspect of the second metatarsal head which appears new. This is concerning for potential osteomyelitis. No fracture identified. Stable extensive vascular calcifications. Stable retained metallic foreign bodies. IMPRESSION: Erosion of the lateral aspect of the left second metatarsal head is concerning for focal osteomyelitis. Electronically Signed   By: Aletta Edouard M.D.   On: 11/17/2016 19:39    Assessment:  1 AKI due to hypovolemia w/shock  2 CKD 3 3 Dehydration 4 R/O sepsis, r/o urinary obstruction 5 Hyperkalemia 6. Met acidosis 7 Hx of Hep C Ab+  Plan: 1 IV bicarbonate 2 PO kayexalate 3 Vol expand 4 Bladder scan 5 Renal ultrasound 6 Urine analysis  Dalores Weger C 11/17/2016, 9:41 PM

## 2016-11-17 NOTE — ED Notes (Signed)
Warm blankets applied

## 2016-11-17 NOTE — ED Notes (Signed)
Tomma LightningFrankie Ferrar [phone], call for updates/questions

## 2016-11-17 NOTE — ED Notes (Signed)
CCM at bedside 

## 2016-11-17 NOTE — ED Triage Notes (Signed)
[facility] weakness and pain al over. Pt was noted to e hypotensive with ems first bp was in 70's systolic. Pt has wound to left foot and appears to have packed dressing. Pt c/o of pain all over and states "they stopped giving me pain medicine". Pt is alert and ox3.

## 2016-11-17 NOTE — ED Notes (Signed)
Pt instructed to provide urine sample, provided urinal.

## 2016-11-17 NOTE — ED Notes (Signed)
MD at bedside. 

## 2016-11-17 NOTE — H&P (Signed)
PULMONARY / CRITICAL CARE MEDICINE   Name: Ronald Simpson MRN: [id number] DOB: 1946-01-17    ADMISSION DATE:  11/17/2016 CONSULTATION DATE:  11/17/2016  REFERRING MD:  Dr. Marcina Millard, EDP   CHIEF COMPLAINT:  Sepsis   HISTORY OF PRESENT ILLNESS:   71 year old male with PMH of CAD, Anemia, Chronic combined systolic/diastolic HF, Hep C with Cirrhosis, CKD stage 3, DM, PVD s/p amputation of left side third/fourth/fifith toe and AKA of right extremity.   Presents to ED on 8/6 with [facility] for weakness. Upon arrival was noted to be hypotensive with systolic 60-70s. Potassium >7.5, Creat 8.48, LA 1.43. Recent amputation site of left third toe site noted to be down to bone. Patient given 2L NS with improvement of BP. Nephrology consulted. PCCM asked to admit.   Recent Admission 7/6-7/12 in setting of sepsis secondary to nonhealing left foot wound with amputation on 7/9 of left third toe (discharged on 2 weeks of doxycycline and Augmentin) and hyperkalemia/acute on chronic kidney injury.   PAST MEDICAL HISTORY :  He  has a past medical history of CAD (coronary artery disease); Chronic anemia; Chronic combined systolic and diastolic CHF (congestive heart failure) (HCC); Cirrhosis (HCC); CKD (chronic kidney disease), stage III; CVA (cerebral infarction); DM (diabetes mellitus), type 2 with peripheral vascular complications (HCC); History of hepatitis C (1990s); Hypoalbuminemia; IDDM (insulin dependent diabetes mellitus) (HCC); Mitral regurgitation; PAF (paroxysmal atrial fibrillation) (HCC); Peripheral neuropathy; Peripheral vascular disease (HCC); and Proteinuria.  PAST SURGICAL HISTORY: He  has a past surgical history that includes Below knee leg amputation (Right); Sternotomy; Coronary artery bypass graft (2005); Amputation (Right, 01/07/2016); Cardiac catheterization (N/A, 01/11/2016); Amputation (Left, 04/04/2016); Esophagogastroduodenoscopy (N/A, 05/17/2016); ir generic historical (07/07/2016); IR THORACENTESIS  ASP PLEURAL SPACE W/IMG GUIDE (08/04/2016); and Transmetatarsal amputation (Left, 10/20/2016).  Allergies  Allergen Reactions  . Hydralazine Other (See Comments)    4/21-4/26/18 pleuritic chest pain with bilateral effusions and pericardial rub. Rule out hydralazine drug-induced lupus   . Percocet [Oxycodone-Acetaminophen] Other (See Comments)    3 episodes of unresponsiveness & apnea; he has received a total of 5 doses of Narcan to date for resusitation ( 2 in [facility] ED & 3 @ [facility]) Hx of drug abuse (cocaine, canabis,alcohol, tobacco) Opiods CONTRAINDICATED !!!   . Tramadol Other (See Comments)    Excess sedation; ? Related to pre-existing hepatic dysfunction with PMH Hep C  . Remeron [Mirtazapine]     No current facility-administered medications on file prior to encounter.    Current Outpatient Prescriptions on File Prior to Encounter  Medication Sig  . acetaminophen (TYLENOL) 325 MG tablet Take 650 mg by mouth every 6 (six) hours as needed for headache (pain).   Marland Kitchen aspirin EC 81 MG EC tablet Take 1 tablet (81 mg total) by mouth daily.  Marland Kitchen atorvastatin (LIPITOR) 20 MG tablet Take 20 mg by mouth at bedtime.   . barrier cream (NON-SPECIFIED) CREA Apply 1 application topically See admin instructions. Apply to inner right scrotum twice daily for denuded area r/t scrotal edema  . carvedilol (COREG) 6.25 MG tablet Take 6.25 mg by mouth 2 (two) times daily with a meal.  . colchicine 0.6 MG tablet Take 0.6 mg by mouth daily.  . digoxin (LANOXIN) 0.125 MG tablet Take 1 tablet (0.125 mg total) by mouth daily.  . ferrous sulfate 325 (65 FE) MG tablet Take 1 tablet (325 mg total) by mouth daily with breakfast.  . geriatric multivitamins-minerals (ELDERTONIC/GEVRABON) ELIX Take 15 mLs by mouth 2 (two) times  daily.  . insulin detemir (LEVEMIR) 100 UNIT/ML injection Inject 8 Units into the skin at bedtime.  Marland Kitchen ipratropium-albuterol (DUONEB) 0.5-2.5 (3) MG/3ML SOLN Take 3 mLs by nebulization every 6  (six) hours as needed (shortness of breath).  Marland Kitchen levothyroxine (SYNTHROID, LEVOTHROID) 25 MCG tablet Take 25 mcg by mouth daily before breakfast.  . nitroGLYCERIN (NITROSTAT) 0.4 MG SL tablet Place 0.4 mg under the tongue every 5 (five) minutes as needed for chest pain.  . Nutritional Supplements (ENSURE ENLIVE PO) Take 237 mLs by mouth 3 (three) times daily between meals.  Marland Kitchen oxyCODONE-acetaminophen (PERCOCET) 10-325 MG tablet Take 1 tablet by mouth every 4 (four) hours as needed for pain.  . OXYGEN Inhale 2 L into the lungs as needed (shortness of breath or O2 sat <90%).   . pantoprazole (PROTONIX) 40 MG tablet Take 1 tablet (40 mg total) by mouth daily.  . polyethylene glycol (MIRALAX / GLYCOLAX) packet Take 17 g by mouth daily as needed for mild constipation.  . saccharomyces boulardii (FLORASTOR) 250 MG capsule Take 250 mg by mouth 2 (two) times daily.  . sacubitril-valsartan (ENTRESTO) 97-103 MG Take 1 tablet by mouth 2 (two) times daily.  Marland Kitchen senna (SENOKOT) 8.6 MG TABS tablet Take 1 tablet (8.6 mg total) by mouth daily as needed for mild constipation.  Marland Kitchen spironolactone (ALDACTONE) 25 MG tablet Take 1 tablet (25 mg total) by mouth daily.  Marland Kitchen torsemide (DEMADEX) 20 MG tablet Take 3 tablets (60 mg total) by mouth daily.    FAMILY HISTORY:  His indicated that his mother is deceased. He indicated that his father is deceased. He indicated that his maternal grandmother is deceased. He indicated that his maternal grandfather is deceased. He indicated that his paternal grandmother is deceased. He indicated that his paternal grandfather is deceased. He indicated that the status of his neg hx is unknown. He indicated that the status of his other is unknown.    SOCIAL HISTORY: He  reports that he quit smoking about 6 months ago. His smoking use included Cigarettes. He has a 5.40 pack-year smoking history. He has never used smokeless tobacco. He reports that he drinks alcohol. He reports that he uses  drugs, including Cocaine and Marijuana.  REVIEW OF SYSTEMS:   Could not review as patient is encephalopathic   SUBJECTIVE:    VITAL SIGNS: BP (!) 84/54   Pulse 83   Temp (!) 96.5 F (35.8 C) (Rectal)   Resp 18   Ht 5\' 9"  (1.753 m)   Wt 63.5 kg (140 lb)   SpO2 100%   BMI 20.67 kg/m   HEMODYNAMICS:    VENTILATOR SETTINGS:    INTAKE / OUTPUT: No intake/output data recorded.  PHYSICAL EXAMINATION: General:  Chronically ill, thin adult male  Neuro:  Alert, does not follow commands, confused  HEENT:  Dry MM  Cardiovascular:  RRR, no MRG  Lungs:  Clear breath sounds, non-labored  Abdomen:  Non-tender, active bowel sounds  Musculoskeletal:  -edema, right foot AKA, left foot 3rd-5th toe amputations, 3rd toe wound > non-healing, bone visible  Skin:  Dry  LABS:  BMET  Recent Labs Lab 11/17/16 1755 11/17/16 2045  NA 133* 131*  K >7.5* 6.6*  CL 100* 101  CO2 11* 12*  BUN 202* 197*  CREATININE 8.48* 7.92*  GLUCOSE 142* 292*    Electrolytes  Recent Labs Lab 11/17/16 1755 11/17/16 2045  CALCIUM 9.2 8.6*    CBC  Recent Labs Lab 11/17/16 1755  WBC 19.8*  HGB 12.5*  HCT 39.4  PLT 332    Coag's No results for input(s): APTT, INR in the last 168 hours.  Sepsis Markers  Recent Labs Lab 11/17/16 1809 11/17/16 2050  LATICACIDVEN 1.43 0.93    ABG No results for input(s): PHART, PCO2ART, PO2ART in the last 168 hours.  Liver Enzymes  Recent Labs Lab 11/17/16 1755  AST 17  ALT 10*  ALKPHOS 186*  BILITOT 1.3*  ALBUMIN 2.6*    Cardiac Enzymes No results for input(s): TROPONINI, PROBNP in the last 168 hours.  Glucose  Recent Labs Lab 11/17/16 2042  GLUCAP 266*    Imaging Dg Chest Port 1 View  Result Date: 11/17/2016 CLINICAL DATA:  Hypotension. EXAM: PORTABLE CHEST 1 VIEW COMPARISON:  Radiograph of Sep 02, 2016. FINDINGS: The heart size and mediastinal contours are within normal limits. Status post cardiac valve repair. No  pneumothorax or pleural effusion is noted. Left lung is clear. Nodular density is seen in right lung base. No consolidative process is noted. The visualized skeletal structures are unremarkable. IMPRESSION: Nodular density seen in right lung base which may represent overlying nipple shadow repeat radiograph with nipple markers is recommended to rule out pulmonary nodule. No other acute abnormality is noted. Electronically Signed   By: Lupita RaiderJames  Green Jr, M.D.   On: 11/17/2016 19:36   Dg Foot Complete Left  Result Date: 11/17/2016 CLINICAL DATA:  History of gangrene of left foot and toes and status post recent left third toe amputation on 10/20/2016. Left foot wound. History of diabetes. EXAM: LEFT FOOT - COMPLETE 3+ VIEW COMPARISON:  10/17/2016 FINDINGS: Interval metatarsal amputation of the third toe. AP view shows focal erosion involving the lateral aspect of the second metatarsal head which appears new. This is concerning for potential osteomyelitis. No fracture identified. Stable extensive vascular calcifications. Stable retained metallic foreign bodies. IMPRESSION: Erosion of the lateral aspect of the left second metatarsal head is concerning for focal osteomyelitis. Electronically Signed   By: Irish LackGlenn  Yamagata M.D.   On: 11/17/2016 19:39     STUDIES:  CXR 8/6 > Nodular density seen in right lung base which may represent overlying nipple shadow Xray Left Foot 8/6 > Erosion of the lateral aspect of the left second metatarsal head. Potential Osteomyelitis. No fracture identified. Stable extensive vascular calcifications. Stable retained metallic foreign bodies.    CULTURES: Blood 8/6 >> U/A 8/6 >>  ANTIBIOTICS: Vancomycin 8/6 >> Zosyn 8/6 >>   SIGNIFICANT EVENTS: 7/9 > Amputation of third left toe  8/6 > Presents to ED   LINES/TUBES: PIV (2-22G)  DISCUSSION: 71 year old male with recent amputation of left third toe secondary to gangrene. Discharged with oral antibiotics. Now presents septic  from ?osteomyelitis.   ASSESSMENT / PLAN:  PULMONARY A: Respiratory Insufficieny in setting of sepsis  P:   Monitor for need for intubation  Maintain Oxygen >92 Pulmonary Hygiene  ABG now   CARDIOVASCULAR A:  Hypotension in setting of sepsis  H/O Chronic Systolic HF (EF 35%), CAD S/P CABG, PAD, PAF (not on anticoagulation due to prior GIB)  P:  Cardiac Monitoring  Maintain MAP >65  ECHO pending  Neo if needed to maintain MAP   RENAL A:   Acute on Chronic Renal Failure Stage 3 Metabolic Acidosis with Anion Gap  Hyperkalemia > s/p Temporization and Kayexalate  P:   Nephrology consulted  Trend BMP > now and 0500 Renal U/S pending  Bicarb gtt @ 150 ml/hr  Bolus one liter now =  total 3L   GASTROINTESTINAL A:   Severe Protein malnutrition  H/O Hepatitis C with Cirrhosis  P:   NPO > for ? Surgery in AM  PPI   HEMATOLOGIC A:   Chronic Anemia  P:  Trend CBC Heparin SQ  INFECTIOUS A:   Osteomyelitis  Non-healing Left foot wound s/p amputation   P:   Vascular Consulted > possible amputation of left foot in AM  Follow culture data Trend WBC and Fever Curve  Continue Vancomycin and Zosyn   ENDOCRINE A:  Hyperglycemia   H/O DM 2, Hypothyroidism  P:   Trend Glucose SSI Beta-Hydro pending  Continue home synthroid   NEUROLOGIC A:   Uremic vs Metabolic vs Hepatic Encephalopathy  H/O CVA P:   Monitor  Hold sedating medications  Ammonia pending   FAMILY  - Updates: no family at bedside   - Inter-disciplinary family meet or Palliative Care meeting due by: 11/24/2016    CC Time: 45 minutes   Jovita Kussmaul, AGACNP-BC [facility]  Pgr: [phone]  PCCM Pgr: [phone]  Attending Addendum: I personally examined this patient and agree with plan as detailed above. Briefly this is a [phone] with HCV cirrhosis, DM, CVA, HTN, CAD, Afib, CHF (EF 35%) Anemia, and recent MRSA Bacteremia related to a gangrenous left toes, which were  amputated. He now returns with septic shock due to osteomyelitis of the left 2nd metatarsal joint seen on xray, and clinically deep ulcer on lateral aspect of left 2nd toe with bone exposed. Patient is awake but not oriented. He c/o pain but is unable to qualify or quantify it. Lungs CTA b/l no distress. He is noted to be in AKI with creatinine 7.92 and up from baseline of 0.79, and also is hyperkalemic (K >7.5 improved to 6.6 after medical management). Started sepsis protocol, pancultured, received 30cc/kg fluid bolus, start broad spectrum antibiotics. Consulted Vascular surgery for evaluation as likely needs further amputation for source control. Currently MAP 65 after IVF's. Will ask RN to place more PIV's but will hold off on CVC at this time. Baseline BP is also lower due to his cirrhosis. Nephrology is consulted and Renal ultrasound ordered. Will place foley catheter. Severe metabolic acidosis due to his AKI, with not much contribution from lactic acidosis (Lactate 1.43 improved to 0.93). Started bicarb gtt. Repeat ABG.   60 minutes critical care time  Milana Obey, MD Pulmonary & Critical Care

## 2016-11-17 NOTE — ED Notes (Signed)
Date and time results received: 11/17/16 7:33 PM (use smartphrase ".now" to insert current time)  Test: Potassium Critical Value: >7.5, no hemolysis   Name of Provider Notified: Dr. Julieanne Mansonegler  Orders Received? Or Actions Taken?: see new orders

## 2016-11-17 NOTE — ED Provider Notes (Signed)
[facility]-EM[facility]ENCY DEPT Provider Note   CSN: [id number] Arrival date & time: 11/17/16  1734     History   Chief Complaint Chief Complaint  Patient presents with  . Weakness    HPI Percell Lamboy is a 71 y.o. male. 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 Recent admission 7/6-7/12 for sepsis 2/2 nonhealing left foot wound with great toe gangrene when he underwent toe amputation and acute on chronic kidney injury. Patient is poor historian (unclear baseline).   HPI  Past Medical History:  Diagnosis Date  . CAD (coronary artery disease)    a. remote CABG.  . Chronic anemia   . Chronic combined systolic and diastolic CHF (congestive heart failure) (HCC)   . Cirrhosis (HCC)   . CKD (chronic kidney disease), stage III   . CVA (cerebral infarction)   . DM (diabetes mellitus), type 2 with peripheral vascular complications (HCC)   . History of hepatitis C 1990s   Hep B immune  . Hypoalbuminemia   . IDDM (insulin dependent diabetes mellitus) (HCC)   . Mitral regurgitation    a. s/p prior ring annuloplasty prosthesis per echo with mod MR by echo 07/2016.  Marland Kitchen PAF (paroxysmal atrial fibrillation) (HCC)    a. not on anticoagulation likely due to h/o IC hemorrhage, prior GIB, cirrhosis. b. suspected prior LAA clipping based on imaging.  . Peripheral neuropathy   . Peripheral vascular disease (HCC)   . Proteinuria     Patient Active Problem List   Diagnosis Date Noted  . Fungal dermatitis 10/27/2016  . Protein-calorie malnutrition, severe 10/19/2016  . Hyperkalemia 10/17/2016  . Delayed surgical wound healing of foot amputation stump (HCC)   . Hypotension   . Adult failure to thrive 09/16/2016  . Status post thoracentesis   . Cardiogenic shock (HCC)   . Respiratory failure with hypoxia (HCC) 08/27/2016  .  Chronic systolic CHF (congestive heart failure) (HCC)   . Acute respiratory failure (HCC)   . Septic shock (HCC)   . Chronic bronchitis (HCC) 08/21/2016  . Positive ANA (antinuclear antibody) 08/21/2016  . Pericarditis 08/12/2016  . Acute on chronic combined systolic and diastolic CHF (congestive heart failure) (HCC) 08/05/2016  . Acute kidney injury superimposed on chronic kidney disease (HCC) 08/05/2016  . Chest pain 08/02/2016  . Pleuritic chest pain 08/02/2016  . Pleural effusion 08/02/2016  . Elevated brain natriuretic peptide (BNP) level 08/02/2016  . Chronic anemia 07/22/2016  . Supratherapeutic international normalized ratio (INR) 07/17/2016  . Ascites 06/27/2016  . Chronic anticoagulation   . Cirrhosis (HCC)   . Acute GI bleeding 05/15/2016  . Melanotic stools 05/15/2016  . Altered mental status 04/10/2016  . Gangrene of toe of left foot (HCC)   . Cystitis   . 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 At risk for adverse drug event 02/14/2016  . Peripheral neuropathy 01/31/2016  . Scrotal edema 01/15/2016  . Dry gangrene (HCC) 01/15/2016  . Renal insufficiency 01/15/2016  . PAD (peripheral artery disease) (HCC) 01/11/2016  . 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 Tobacco abuse   . Tachycardia   . Hyponatremia   . Hypoalbuminemia   . MRSA cellulitis of left foot   . Serratia marcescens infection (HCC)   . Escherichia coli infection   .  CAD in native artery   . PAF (paroxysmal atrial fibrillation) (HCC)   . Uncontrolled type 2 diabetes mellitus with complication (HCC)   . Hepatitis C without hepatic coma 12/27/2015  . Diabetes mellitus with peripheral vascular disease (HCC) 12/27/2015  . History of CVA (cerebrovascular accident) 12/27/2015  . AKI (acute kidney injury) (HCC) 12/27/2015  . Hypothyroidism, acquired 12/27/2015  . Benign essential HTN 12/27/2015  . Sepsis (HCC) 12/26/2015     Past Surgical History:  Procedure Laterality Date  . AMPUTATION Right 01/07/2016   Procedure: AMPUTATION ABOVE KNEE;  Surgeon: Larina Earthlyodd F Early, MD;  [facility];  Service: Vascular;  Laterality: Right;  . AMPUTATION Left 04/04/2016   Procedure: LEFT FOURTH AND FIFTH  TOE AMPUTATION;  Surgeon: Larina Earthlyodd F Early, MD;  [facility];  Service: Vascular;  Laterality: Left;  . BELOW KNEE LEG AMPUTATION Right   . CORONARY ARTERY BYPASS GRAFT  2005  . ESOPHAGOGASTRODUODENOSCOPY N/A 05/17/2016   Procedure: ESOPHAGOGASTRODUODENOSCOPY (EGD);  Surgeon: Meryl DareMalcolm T Stark, MD;  [facility];  Service: Endoscopy;  Laterality: N/A;  . IR GENERIC HISTORICAL  07/07/2016   IR PARACENTESIS 07/07/2016 Gershon CraneWendy Sanders Blair, PA-C [facility]-INTERV RAD  . IR THORACENTESIS ASP PLEURAL SPACE W/IMG GUIDE  08/04/2016  . PERIPHERAL VASCULAR CATHETERIZATION N/A 01/11/2016   Procedure: Lower Extremity Angiography;  Surgeon: Nada LibmanVance W Brabham, MD;  [facility];  Service: Cardiovascular;  Laterality: N/A;  . STERNOTOMY    . TRANSMETATARSAL AMPUTATION Left 10/20/2016   Procedure: LEFT THIRD TOE  AMPUTATION;  Surgeon: Larina EarthlyEarly, Todd F, MD;  [facility];  Service: Vascular;  Laterality: Left;       Home Medications    Prior to Admission medications   Medication Sig Start Date End Date Taking? Authorizing Provider  acetaminophen (TYLENOL) 325 MG tablet Take 650 mg by mouth every 6 (six) hours as needed (for pain or headaches).    Yes [provider]  aspirin EC 81 MG EC tablet Take 1 tablet (81 mg total) by mouth daily. 08/08/16  Yes Richarda OverlieAbrol, Nayana, MD  atorvastatin (LIPITOR) 20 MG tablet Take 20 mg by mouth at bedtime.    Yes [provider]  bisacodyl (DULCOLAX) 10 MG suppository Place 10 mg rectally once as needed (for constipation not relieved by Milk of Magnesia).   Yes [provider]  carvedilol (COREG) 6.25 MG tablet Take 6.25 mg by mouth 2 (two) times daily with a meal.   Yes  [provider]  colchicine 0.6 MG tablet Take 0.6 mg by mouth daily.   Yes [provider]  digoxin (LANOXIN) 0.125 MG tablet Take 1 tablet (0.125 mg total) by mouth daily. 09/06/16  Yes Johnson, Clanford L, MD  ferrous sulfate 325 (65 FE) MG tablet Take 1 tablet (325 mg total) by mouth daily with breakfast. 09/06/16  Yes Johnson, Clanford L, MD  geriatric multivitamins-minerals (ELDERTONIC/GEVRABON) ELIX Take 15 mLs by mouth 2 (two) times daily.   Yes [provider]  Insulin Detemir (LEVEMIR FLEXTOUCH) 100 UNIT/ML Pen Inject 8 Units into the skin at bedtime.   Yes [provider]  ipratropium-albuterol (DUONEB) 0.5-2.5 (3) MG/3ML SOLN Take 3 mLs by nebulization every 6 (six) hours as needed (shortness of breath).   Yes [provider]  levothyroxine (SYNTHROID, LEVOTHROID) 25 MCG tablet Take 25 mcg by mouth daily before breakfast.   Yes [provider]  magnesium hydroxide (MILK OF MAGNESIA) 400 MG/5ML suspension Take 30 mLs by mouth once as needed  for mild constipation.   Yes [provider]  nitroGLYCERIN (NITROSTAT) 0.4 MG SL tablet Place 0.4 mg under the tongue every 5 (five) minutes x 3 doses as needed for chest pain.    Yes [provider]  Nutritional Supplements (ENSURE ENLIVE PO) Take 237 mLs by mouth 3 (three) times daily between meals.   Yes [provider]  oxyCODONE-acetaminophen (PERCOCET) 7.5-325 MG tablet Take 1 tablet by mouth every 8 (eight) hours as needed (for pain).   Yes [provider]  OXYGEN Inhale 2 L into the lungs as needed (shortness of breath or O2 sat <90%).    Yes [provider]  pantoprazole (PROTONIX) 40 MG tablet Take 1 tablet (40 mg total) by mouth daily. 05/22/16  Yes Rodolph Bong, MD  polyethylene glycol [facility] / Ethelene Hal) packet Take 17 g by mouth daily as needed for mild constipation. 10/23/16  Yes Richarda Overlie, MD  saccharomyces boulardii (FLORASTOR) 250  MG capsule Take 250 mg by mouth 2 (two) times daily.   Yes [provider]  sacubitril-valsartan (ENTRESTO) 97-103 MG Take 1 tablet by mouth 2 (two) times daily. 11/05/16  Yes Richarda Overlie, MD  senna (SENOKOT) 8.6 MG TABS tablet Take 1 tablet (8.6 mg total) by mouth daily as needed for mild constipation. 09/06/16  Yes Johnson, Clanford L, MD  Sodium Phosphates (RA SALINE ENEMA) 19-7 GM/118ML ENEM Place 1 enema rectally once as needed (for constipation not relieved by a Dulcolax suppository and call the MD if no relief from the enema).   Yes [provider]  spironolactone (ALDACTONE) 25 MG tablet Take 1 tablet (25 mg total) by mouth daily. 09/06/16  Yes Johnson, Clanford L, MD  torsemide (DEMADEX) 20 MG tablet Take 3 tablets (60 mg total) by mouth daily. 10/30/16  Yes Richarda Overlie, MD  barrier cream (NON-SPECIFIED) CREA Apply 1 application topically See admin instructions. Apply to inner right scrotum twice daily for denuded area r/t scrotal edema    [provider]    Family History Family History  Problem Relation Age of Onset  . Hypertension Other   . Diabetes Other   . Mental illness Other   . Lupus Other   . Diabetes Father   . Cancer Neg Hx   . Heart disease Neg Hx   . Stroke Neg Hx     Social History Social History  Substance Use Topics  . Smoking status: Former Smoker    Packs/day: 0.10    Years: 54.00    Types: Cigarettes    Quit date: 04/25/2016  . Smokeless tobacco: Never Used  . Alcohol use Yes     Comment: 04/02/2016 "aint suppose to drink in the nursing home"     Allergies   Hydralazine; Percocet [oxycodone-acetaminophen]; Tramadol; and Remeron [mirtazapine]   Review of Systems Review of Systems  Constitutional: Negative for chills and fever.  HENT: Negative for ear pain and sore throat.   Eyes: Negative for pain and visual disturbance.  Respiratory: Negative for cough and shortness of breath.   Cardiovascular: Negative for chest  pain and palpitations.  Gastrointestinal: Negative for abdominal pain and vomiting.  Genitourinary: Negative for dysuria and hematuria.  Musculoskeletal: Negative for arthralgias and back pain.  Skin: Positive for color change and wound. Negative for rash.  Neurological: Positive for weakness. Negative for seizures and syncope.  All other systems reviewed and are negative.    Physical Exam Updated Vital Signs BP 137/79   Pulse 98   Temp  98.6 F (37 C) (Oral)   Resp 14   Ht 5\' 10"  (1.778 m)   Wt 60.4 kg (133 lb 2.5 oz)   SpO2 100%   BMI 19.11 kg/m   Physical Exam  Constitutional: He appears well-developed.  Cachectic appearing male  HENT:  Head: Normocephalic and atraumatic.  Eyes: Conjunctivae are normal.  Neck: Neck supple.  Cardiovascular: Normal rate and regular rhythm.   No murmur heard. Pulmonary/Chest: Effort normal and breath sounds normal. No respiratory distress.  Abdominal: Soft. There is no tenderness.  Musculoskeletal: He exhibits edema and deformity.  RLE: AKA LLE: 3rd toe amputation with bone exposed, drainage.  Neurological: He is alert.  Skin: Skin is warm and dry.  Psychiatric: He has a normal mood and affect.  Nursing note and vitals reviewed.    ED Treatments / Results  Labs (all labs ordered are listed, but only abnormal results are displayed) Labs Reviewed  MRSA PCR SCREENING - Abnormal; Notable for the following:       Result Value   MRSA by PCR POSITIVE (*)    All other components within normal limits  COMPREHENSIVE METABOLIC PANEL - Abnormal; Notable for the following:    Sodium 133 (*)    Potassium >7.5 (*)    Chloride 100 (*)    CO2 11 (*)    Glucose, Bld 142 (*)    BUN 202 (*)    Creatinine, Ser 8.48 (*)    Total Protein 8.8 (*)    Albumin 2.6 (*)    ALT 10 (*)    Alkaline Phosphatase 186 (*)    Total Bilirubin 1.3 (*)    GFR calc non Af Amer 6 (*)    GFR calc Af Amer 7 (*)    Anion gap 22 (*)    All other components  within normal limits  CBC WITH DIFFERENTIAL/PLATELET - Abnormal; Notable for the following:    WBC 19.8 (*)    Hemoglobin 12.5 (*)    RDW 20.1 (*)    Neutro Abs 17.4 (*)    Monocytes Absolute 1.1 (*)    All other components within normal limits  URINALYSIS, ROUTINE W REFLEX MICROSCOPIC - Abnormal; Notable for the following:    APPearance TURBID (*)    Hgb urine dipstick SMALL (*)    Ketones, ur 5 (*)    Protein, ur 100 (*)    Leukocytes, UA MODERATE (*)    All other components within normal limits  BASIC METABOLIC PANEL - Abnormal; Notable for the following:    Sodium 131 (*)    Potassium 6.6 (*)    CO2 12 (*)    Glucose, Bld 292 (*)    BUN 197 (*)    Creatinine, Ser 7.92 (*)    Calcium 8.6 (*)    GFR calc non Af Amer 6 (*)    GFR calc Af Amer 7 (*)    Anion gap 18 (*)    All other components within normal limits  BETA-HYDROXYBUTYRIC ACID - Abnormal; Notable for the following:    Beta-Hydroxybutyric Acid 3.83 (*)    All other components within normal limits  SEDIMENTATION RATE - Abnormal; Notable for the following:    Sed Rate 108 (*)    All other components within normal limits  C-REACTIVE PROTEIN - Abnormal; Notable for the following:    CRP 15.2 (*)    All other components within normal limits  CBC - Abnormal; Notable for the following:    WBC 17.3 (*)  Hemoglobin 12.6 (*)    HCT 38.4 (*)    RDW 19.7 (*)    All other components within normal limits  PHOSPHORUS - Abnormal; Notable for the following:    Phosphorus 5.5 (*)    All other components within normal limits  BASIC METABOLIC PANEL - Abnormal; Notable for the following:    Potassium 5.8 (*)    Chloride 100 (*)    CO2 19 (*)    Glucose, Bld 126 (*)    BUN 172 (*)    Creatinine, Ser 6.16 (*)    GFR calc non Af Amer 8 (*)    GFR calc Af Amer 10 (*)    Anion gap 18 (*)    All other components within normal limits  AMMONIA - Abnormal; Notable for the following:    Ammonia 42 (*)    All other components  within normal limits  BASIC METABOLIC PANEL - Abnormal; Notable for the following:    Potassium 7.0 (*)    CO2 14 (*)    Glucose, Bld 114 (*)    BUN 183 (*)    Creatinine, Ser 7.33 (*)    GFR calc non Af Amer 7 (*)    GFR calc Af Amer 8 (*)    Anion gap 18 (*)    All other components within normal limits  BLOOD GAS, ARTERIAL - Abnormal; Notable for the following:    pCO2 arterial 19.7 (*)    pO2, Arterial 112 (*)    Bicarbonate 11.1 (*)    Acid-base deficit 13.3 (*)    All other components within normal limits  GLUCOSE, CAPILLARY - Abnormal; Notable for the following:    Glucose-Capillary 109 (*)    All other components within normal limits  GLUCOSE, CAPILLARY - Abnormal; Notable for the following:    Glucose-Capillary 108 (*)    All other components within normal limits  BASIC METABOLIC PANEL - Abnormal; Notable for the following:    Chloride 98 (*)    BUN 153 (*)    Creatinine, Ser 4.61 (*)    Calcium 8.4 (*)    GFR calc non Af Amer 12 (*)    GFR calc Af Amer 14 (*)    All other components within normal limits  GLUCOSE, CAPILLARY - Abnormal; Notable for the following:    Glucose-Capillary 128 (*)    All other components within normal limits  GLUCOSE, CAPILLARY - Abnormal; Notable for the following:    Glucose-Capillary 112 (*)    All other components within normal limits  RENAL FUNCTION PANEL - Abnormal; Notable for the following:    Chloride 95 (*)    BUN 138 (*)    Creatinine, Ser 3.84 (*)    Calcium 8.2 (*)    Albumin 2.0 (*)    GFR calc non Af Amer 15 (*)    GFR calc Af Amer 17 (*)    Anion gap 16 (*)    All other components within normal limits  CBC - Abnormal; Notable for the following:    WBC 16.7 (*)    RBC 3.90 (*)    Hemoglobin 10.7 (*)    HCT 32.8 (*)    RDW 19.6 (*)    All other components within normal limits  MAGNESIUM - Abnormal; Notable for the following:    Magnesium 1.6 (*)    All other components within normal limits  I-STAT VENOUS BLOOD  GAS, ED - Abnormal; Notable for the following:    pH, Ven  7.177 (*)    pCO2, Ven 40.7 (*)    pO2, Ven 48.0 (*)    Bicarbonate 15.1 (*)    Acid-base deficit 13.0 (*)    All other components within normal limits  CBG MONITORING, ED - Abnormal; Notable for the following:    Glucose-Capillary 266 (*)    All other components within normal limits  CULTURE, BLOOD (ROUTINE X 2)  CULTURE, BLOOD (ROUTINE X 2)  URINE CULTURE  PROCALCITONIN  MAGNESIUM  PROCALCITONIN  POTASSIUM  GLUCOSE, CAPILLARY  PROCALCITONIN  GLUCOSE, CAPILLARY  GLUCOSE, CAPILLARY  GLUCOSE, CAPILLARY  GLUCOSE, CAPILLARY  I-STAT CG4 LACTIC ACID, ED  I-STAT CG4 LACTIC ACID, ED  I-STAT CG4 LACTIC ACID, ED    EKG  EKG Interpretation  Date/Time:  Monday November 17 2016 17:37:51 EDT Ventricular Rate:  45 PR Interval:    QRS Duration: 110 QT Interval:  475 QTC Calculation: 411 R Axis:   95 Text Interpretation:  Sinus bradycardia Atrial premature complexes Right axis deviation Abnormal T, probable ischemia, anterior leads When compared with ECG of 10/17/2016, T wave inversion is now present Anterior leads Confirmed by Dione Booze ([id number]) on 11/18/2016 2:23:20 PM       Radiology US Renal  Result Date: 11/19/2016 CLINICAL DATA:  Acute onset of renal insufficiency. Initial encounter. EXAM: RENAL / URINARY TRACT ULTRASOUND COMPLETE COMPARISON:  CT of the abdomen and pelvis performed 12/27/2015 FINDINGS: Right Kidney: Length: 11.6 cm. Echogenicity within normal limits. No mass or hydronephrosis visualized. Left Kidney: Length: 9.1 cm. Echogenicity within normal limits. No mass or hydronephrosis visualized. Bladder: Decompressed, with a Foley catheter in place. A stone is noted within the gallbladder. IMPRESSION: 1. Unremarkable renal ultrasound. 2. Cholelithiasis.  Gallbladder otherwise grossly unremarkable. Electronically Signed   By: Roanna Raider M.D.   On: 11/19/2016 03:33   Dg Chest Port 1 View  Result Date:  11/17/2016 CLINICAL DATA:  Hypotension. EXAM: PORTABLE CHEST 1 VIEW COMPARISON:  Radiograph of Sep 02, 2016. FINDINGS: The heart size and mediastinal contours are within normal limits. Status post cardiac valve repair. No pneumothorax or pleural effusion is noted. Left lung is clear. Nodular density is seen in right lung base. No consolidative process is noted. The visualized skeletal structures are unremarkable. IMPRESSION: Nodular density seen in right lung base which may represent overlying nipple shadow repeat radiograph with nipple markers is recommended to rule out pulmonary nodule. No other acute abnormality is noted. Electronically Signed   By: Lupita Raider, M.D.   On: 11/17/2016 19:36   Dg Foot Complete Left  Result Date: 11/17/2016 CLINICAL DATA:  History of gangrene of left foot and toes and status post recent left third toe amputation on 10/20/2016. Left foot wound. History of diabetes. EXAM: LEFT FOOT - COMPLETE 3+ VIEW COMPARISON:  10/17/2016 FINDINGS: Interval metatarsal amputation of the third toe. AP view shows focal erosion involving the lateral aspect of the second metatarsal head which appears new. This is concerning for potential osteomyelitis. No fracture identified. Stable extensive vascular calcifications. Stable retained metallic foreign bodies. IMPRESSION: Erosion of the lateral aspect of the left second metatarsal head is concerning for focal osteomyelitis. Electronically Signed   By: Irish Lack M.D.   On: 11/17/2016 19:39    Procedures Procedures (including critical care time)  Medications Ordered in ED Medications  piperacillin-tazobactam (ZOSYN) IVPB 2.25 g (0 g Intravenous Stopped 11/19/16 0448)  0.9 %  sodium chloride infusion (250 mLs Intravenous Stopped 11/18/16 1100)  heparin injection 5,000 Units (5,000 Units  Subcutaneous Given 11/19/16 0536)  acetaminophen (OFIRMEV) IV 1,000 mg (0 mg Intravenous Stopped 11/18/16 2046)  vancomycin (VANCOCIN) IVPB 750 mg/150 ml premix  (not administered)  mupirocin ointment (BACTROBAN) 2 % 1 application (1 application Nasal Given 11/19/16 0938)  Chlorhexidine Gluconate Cloth 2 % PADS 6 each (6 each Topical Given 11/18/16 1000)  levothyroxine (SYNTHROID, LEVOTHROID) tablet 25 mcg (25 mcg Oral Given 11/19/16 0837)  pantoprazole (PROTONIX) EC tablet 40 mg (40 mg Oral Given 11/19/16 0937)  insulin aspart (novoLOG) injection 2-6 Units (2 Units Subcutaneous Not Given 11/19/16 0800)  dextrose 5 % and 0.45% NaCl 1,000 mL infusion ( Intravenous New Bag/Given 11/19/16 0938)  sodium chloride 0.9 % bolus 1,000 mL (0 mLs Intravenous Stopped 11/17/16 2147)    And  sodium chloride 0.9 % bolus 1,000 mL (0 mLs Intravenous Stopped 11/17/16 1918)  piperacillin-tazobactam (ZOSYN) IVPB 3.375 g (0 g Intravenous Stopped 11/17/16 1946)  vancomycin (VANCOCIN) IVPB 1000 mg/200 mL premix (0 mg Intravenous Stopped 11/17/16 2017)  calcium chloride 1 g in sodium chloride 0.9 % 100 mL IVPB (0 g Intravenous Stopped 11/17/16 2048)  insulin aspart (novoLOG) injection 10 Units (10 Units Intravenous Given 11/17/16 2012)  dextrose 50 % solution 50 mL (50 mLs Intravenous Given 11/17/16 2011)  sodium chloride 0.9 % bolus 1,000 mL (1,000 mLs Intravenous New Bag/Given 11/17/16 2150)  sodium polystyrene (KAYEXALATE) 15 GM/60ML suspension 50 g (50 g Rectal Given 11/18/16 0404)  fentaNYL (SUBLIMAZE) injection 12.5 mcg (12.5 mcg Intravenous Given 11/18/16 0133)  sodium chloride 0.9 % bolus 1,000 mL (0 mLs Intravenous Stopped 11/18/16 1306)  magnesium sulfate IVPB 2 g 50 mL (0 g Intravenous Stopped 11/19/16 1037)     Initial Impression / Assessment and Plan / ED Course  I have reviewed the triage vital signs and the nursing notes.  Pertinent labs & imaging results that were available during my care of the patient were reviewed by me and considered in my medical decision making (see chart for details).    Patient is a 71 year old male with recent admission for sepsis/left toe amputation and gangrene  who presents with increased drainage from wound site. Patient arrived hypotensive, exam as above concerning for exposed bone on wound site.   Concern for sepsis given history and current examination. Code sepsis initiated with IV fluid resuscitation, broad spectrum antibiotics. Cultures pending. Labs significant for acute on chronic renal failure (Cr 8), hyperkalemia. No acute EKG changes. Patient temporized. Left foot XR showing osetomyelitis.   Discussed with Nephrology for acute on chronic renal failure, plan to fluid resuscitate, continue temporizing and kayexalate. Bicarb drip initiated in ED. Discussed with Critical care for admission. Plan for possible amputation of left foot with Vascular in AM.   Patient and plan of care discussed with Attending physician, Dr. Rush Landmark.    Final Clinical Impressions(s) / ED Diagnoses   Final diagnoses:  AKI (acute kidney injury) (HCC)  Septic shock Hypotension Hyperkalemia Osteomyelitis  New Prescriptions Current Discharge Medication List       Wynelle Cleveland, MD 11/19/16 1043    Tegeler, Canary Brim, MD 11/19/16 1126

## 2016-11-18 ENCOUNTER — Inpatient Hospital Stay (HOSPITAL_COMMUNITY): Payer: Medicare Other

## 2016-11-18 ENCOUNTER — Ambulatory Visit: Payer: Medicare Other | Admitting: Family

## 2016-11-18 DIAGNOSIS — A419 Sepsis, unspecified organism: Secondary | ICD-10-CM

## 2016-11-18 DIAGNOSIS — I10 Essential (primary) hypertension: Secondary | ICD-10-CM

## 2016-11-18 LAB — BASIC METABOLIC PANEL
ANION GAP: 15 (ref 5–15)
Anion gap: 18 — ABNORMAL HIGH (ref 5–15)
Anion gap: 18 — ABNORMAL HIGH (ref 5–15)
BUN: 172 mg/dL — ABNORMAL HIGH (ref 6–20)
BUN: 153 mg/dL — AB (ref 6–20)
BUN: 183 mg/dL — AB (ref 6–20)
CALCIUM: 8.4 mg/dL — AB (ref 8.9–10.3)
CHLORIDE: 100 mmol/L — AB (ref 101–111)
CHLORIDE: 103 mmol/L (ref 101–111)
CO2: 14 mmol/L — ABNORMAL LOW (ref 22–32)
CO2: 26 mmol/L (ref 22–32)
CO2: 19 mmol/L — AB (ref 22–32)
CREATININE: 6.16 mg/dL — AB (ref 0.61–1.24)
CREATININE: 4.61 mg/dL — AB (ref 0.61–1.24)
Calcium: 9.5 mg/dL (ref 8.9–10.3)
Calcium: 9.2 mg/dL (ref 8.9–10.3)
Chloride: 98 mmol/L — ABNORMAL LOW (ref 101–111)
Creatinine, Ser: 7.33 mg/dL — ABNORMAL HIGH (ref 0.61–1.24)
GFR calc Af Amer: 14 mL/min — ABNORMAL LOW (ref >60)
GFR calc non Af Amer: 7 mL/min — ABNORMAL LOW (ref >60)
GFR calc non Af Amer: 8 mL/min — ABNORMAL LOW (ref >60)
GFR, EST AFRICAN AMERICAN: 10 mL/min — AB (ref >60)
GFR, EST AFRICAN AMERICAN: 8 mL/min — AB (ref >60)
GFR, EST NON AFRICAN AMERICAN: 12 mL/min — AB (ref >60)
GLUCOSE: 126 mg/dL — AB (ref 65–99)
GLUCOSE: 85 mg/dL (ref 65–99)
Glucose, Bld: 114 mg/dL — ABNORMAL HIGH (ref 65–99)
POTASSIUM: 7 mmol/L — AB (ref 3.5–5.1)
Potassium: 5.8 mmol/L — ABNORMAL HIGH (ref 3.5–5.1)
Potassium: 4.4 mmol/L (ref 3.5–5.1)
SODIUM: 135 mmol/L (ref 135–145)
Sodium: 137 mmol/L (ref 135–145)
Sodium: 139 mmol/L (ref 135–145)

## 2016-11-18 LAB — URINALYSIS, ROUTINE W REFLEX MICROSCOPIC
BILIRUBIN URINE: NEGATIVE
Bacteria, UA: NONE SEEN
GLUCOSE, UA: NEGATIVE mg/dL
KETONES UR: 5 mg/dL — AB
NITRITE: NEGATIVE
PH: 5 (ref 5.0–8.0)
Protein, ur: 100 mg/dL — AB
Specific Gravity, Urine: 1.013 (ref 1.005–1.030)
Squamous Epithelial / LPF: NONE SEEN

## 2016-11-18 LAB — CBC
HCT: 38.4 % — ABNORMAL LOW (ref 39.0–52.0)
Hemoglobin: 12.6 g/dL — ABNORMAL LOW (ref 13.0–17.0)
MCH: 27.9 pg (ref 26.0–34.0)
MCHC: 32.8 g/dL (ref 30.0–36.0)
MCV: 85.1 fL (ref 78.0–100.0)
PLATELETS: 298 10*3/uL (ref 150–400)
RBC: 4.51 MIL/uL (ref 4.22–5.81)
RDW: 19.7 % — ABNORMAL HIGH (ref 11.5–15.5)
WBC: 17.3 10*3/uL — ABNORMAL HIGH (ref 4.0–10.5)

## 2016-11-18 LAB — MRSA PCR SCREENING: MRSA by PCR: POSITIVE — AB

## 2016-11-18 LAB — PROCALCITONIN
PROCALCITONIN: 0.72 ng/mL
Procalcitonin: 0.43 ng/mL

## 2016-11-18 LAB — BLOOD GAS, ARTERIAL
Acid-base deficit: 13.3 mmol/L — ABNORMAL HIGH (ref 0.0–2.0)
Bicarbonate: 11.1 mmol/L — ABNORMAL LOW (ref 20.0–28.0)
Drawn by: 345601
O2 Saturation: 97.9 %
PCO2 ART: 19.7 mmHg — AB (ref 32.0–48.0)
PH ART: 7.37 (ref 7.350–7.450)
PO2 ART: 112 mmHg — AB (ref 83.0–108.0)
Patient temperature: 98.6

## 2016-11-18 LAB — GLUCOSE, CAPILLARY
GLUCOSE-CAPILLARY: 93 mg/dL (ref 65–99)
GLUCOSE-CAPILLARY: 86 mg/dL (ref 65–99)
Glucose-Capillary: 108 mg/dL — ABNORMAL HIGH (ref 65–99)
Glucose-Capillary: 109 mg/dL — ABNORMAL HIGH (ref 65–99)
Glucose-Capillary: 112 mg/dL — ABNORMAL HIGH (ref 65–99)
Glucose-Capillary: 128 mg/dL — ABNORMAL HIGH (ref 65–99)

## 2016-11-18 LAB — ECHOCARDIOGRAM COMPLETE
Height: 70 in
WEIGHTICAEL: 2021.18 [oz_av]

## 2016-11-18 LAB — AMMONIA: Ammonia: 42 umol/L — ABNORMAL HIGH (ref 9–35)

## 2016-11-18 LAB — PHOSPHORUS: Phosphorus: 5.5 mg/dL — ABNORMAL HIGH (ref 2.5–4.6)

## 2016-11-18 LAB — POTASSIUM: POTASSIUM: 5.1 mmol/L (ref 3.5–5.1)

## 2016-11-18 LAB — SEDIMENTATION RATE: SED RATE: 108 mm/h — AB (ref 0–16)

## 2016-11-18 LAB — MAGNESIUM: MAGNESIUM: 2 mg/dL (ref 1.7–2.4)

## 2016-11-18 LAB — BETA-HYDROXYBUTYRIC ACID: BETA-HYDROXYBUTYRIC ACID: 3.83 mmol/L — AB (ref 0.05–0.27)

## 2016-11-18 LAB — C-REACTIVE PROTEIN: CRP: 15.2 mg/dL — ABNORMAL HIGH (ref <1.0)

## 2016-11-18 MED ORDER — VANCOMYCIN HCL IN DEXTROSE 750-5 MG/150ML-% IV SOLN
750.0000 mg | INTRAVENOUS | Status: DC
  Administered 2016-11-19: 750 mg via INTRAVENOUS
  Filled 2016-11-18: qty 150

## 2016-11-18 MED ORDER — CHLORHEXIDINE GLUCONATE CLOTH 2 % EX PADS
6.0000 | MEDICATED_PAD | Freq: Every day | CUTANEOUS | Status: AC
  Administered 2016-11-18 – 2016-11-22 (×5): 6 via TOPICAL

## 2016-11-18 MED ORDER — FENTANYL CITRATE (PF) 100 MCG/2ML IJ SOLN
12.5000 ug | Freq: Once | INTRAMUSCULAR | Status: AC
  Administered 2016-11-18: 12.5 ug via INTRAVENOUS
  Filled 2016-11-18: qty 2

## 2016-11-18 MED ORDER — SODIUM CHLORIDE 0.9 % IV BOLUS (SEPSIS)
1000.0000 mL | Freq: Once | INTRAVENOUS | Status: AC
  Administered 2016-11-18: 1000 mL via INTRAVENOUS

## 2016-11-18 MED ORDER — PANTOPRAZOLE SODIUM 40 MG PO TBEC
40.0000 mg | DELAYED_RELEASE_TABLET | Freq: Every day | ORAL | Status: DC
  Administered 2016-11-19 – 2016-11-23 (×5): 40 mg via ORAL
  Filled 2016-11-18 (×5): qty 1

## 2016-11-18 MED ORDER — MUPIROCIN 2 % EX OINT
1.0000 "application " | TOPICAL_OINTMENT | Freq: Two times a day (BID) | CUTANEOUS | Status: AC
  Administered 2016-11-18 – 2016-11-23 (×9): 1 via NASAL
  Filled 2016-11-18 (×5): qty 22

## 2016-11-18 MED ORDER — ACETAMINOPHEN 10 MG/ML IV SOLN
1000.0000 mg | Freq: Four times a day (QID) | INTRAVENOUS | Status: AC | PRN
  Administered 2016-11-18 (×2): 1000 mg via INTRAVENOUS
  Filled 2016-11-18 (×2): qty 100

## 2016-11-18 MED ORDER — FENTANYL CITRATE (PF) 100 MCG/2ML IJ SOLN: 12.5000 ug | INTRAMUSCULAR | Status: DC | PRN

## 2016-11-18 MED ORDER — LEVOTHYROXINE SODIUM 25 MCG PO TABS
25.0000 ug | ORAL_TABLET | Freq: Every day | ORAL | Status: DC
  Administered 2016-11-19 – 2016-11-23 (×5): 25 ug via ORAL
  Filled 2016-11-18 (×6): qty 1

## 2016-11-18 NOTE — Progress Notes (Signed)
[facility] Rounding Note  Subjective/Interval History:  Getting IVF VVS has seen re foot   Objective Vital signs in last 24 hours: Vitals:   11/18/16 0600 11/18/16 0630 11/18/16 0700 11/18/16 0755  BP: 114/78 103/60 (!) 91/57   Pulse: 92 89 84   Resp: _0 Temp:    (!) 97.5 F (36.4 C)  TempSrc:    Oral  SpO2: 100% 95% 99%   Weight:      Height:       Weight change:   Intake/Output Summary (Last 24 hours) at 11/18/16 0948 Last data filed at 11/18/16 0700  Gross per 24 hour  Intake             3875 ml  Output             1300 ml  Net             2575 ml   Physical Exam:  Blood pressure (!) 91/57, pulse 84, temperature (!) 97.5 F (36.4 C), temperature source Oral, resp. rate 12, height _1  (1.778 m), weight 57.3 kg (126 lb 5.2 oz), SpO2 99 %.  Elderly WM  Tacky MM's  C/o dry mouth No JVD in the supine position Lungs clear S1SD2 No S3 Abd scaphoid not tender No LE edema Ischemic remaining toes 1,2 left foot R AKA  Labs: Basic Metabolic Panel:  Recent Labs Lab 11/17/16 1755 11/17/16 2045 11/18/16 0012 11/18/16 0601 11/18/16 0900  NA 133* 131* 135 137  --   K >7.5* 6.6* 7.0* 5.8* 5.1  CL 100* 101 103 100*  --   CO2 11* 12* 14* 19*  --   GLUCOSE 142* 292* 114* 126*  --   BUN 202* 197* 183* 172*  --   CREATININE 8.48* 7.92* 7.33* 6.16*  --   CALCIUM 9.2 8.6* 9.5 9.2  --   PHOS  --   --   --  5.5*  --     Recent Labs Lab 11/17/16 1755  AST 17  ALT 10*  ALKPHOS 186*  BILITOT 1.3*  PROT 8.8*  ALBUMIN 2.6*    Recent Labs Lab 11/18/16 0012  AMMONIA 42*     Recent Labs Lab 11/17/16 1755 11/18/16 0601  WBC 19.8* 17.3*  NEUTROABS 17.4*  --   HGB 12.5* 12.6*  HCT 39.4 38.4*  MCV 87.2 85.1  PLT 332 298     Recent Labs Lab 11/17/16 2042 11/18/16 0015 11/18/16 0348  GLUCAP 266* 109* 108*   Studies/Results: Dg Chest Port 1 View  Result Date: 11/17/2016 CLINICAL DATA:  Hypotension. EXAM: PORTABLE CHEST 1 VIEW COMPARISON:   Radiograph of Sep 02, 2016. FINDINGS: The heart size and mediastinal contours are within normal limits. Status post cardiac valve repair. No pneumothorax or pleural effusion is noted. Left lung is clear. Nodular density is seen in right lung base. No consolidative process is noted. The visualized skeletal structures are unremarkable. IMPRESSION: Nodular density seen in right lung base which may represent overlying nipple shadow repeat radiograph with nipple markers is recommended to rule out pulmonary nodule. No other acute abnormality is noted. Electronically Signed   By: Marijo Conception, M.D.   On: 11/17/2016 19:36   Dg Foot Complete Left  Result Date: 11/17/2016 CLINICAL DATA:  History of gangrene of left foot and toes and status post recent left third toe amputation on 10/20/2016. Left foot wound. History of diabetes. EXAM: LEFT FOOT - COMPLETE 3+ VIEW COMPARISON:  10/17/2016 FINDINGS:  Interval metatarsal amputation of the third toe. AP view shows focal erosion involving the lateral aspect of the second metatarsal head which appears new. This is concerning for potential osteomyelitis. No fracture identified. Stable extensive vascular calcifications. Stable retained metallic foreign bodies. IMPRESSION: Erosion of the lateral aspect of the left second metatarsal head is concerning for focal osteomyelitis. Electronically Signed   By: Aletta Edouard M.D.   On: 11/17/2016 19:39   Medications: . sodium chloride    . acetaminophen Stopped (11/18/16 0418)  . albuterol 10 mg/hr (11/17/16 2023)  . phenylephrine (NEO-SYNEPHRINE) Adult infusion    . piperacillin-tazobactam (ZOSYN)  IV Stopped (11/18/16 0450)  .  sodium bicarbonate (isotonic) infusion in sterile water 150 mL/hr at 11/18/16 0530  . [START ON 11/19/2016] vancomycin     . Chlorhexidine Gluconate Cloth  6 each Topical Q0600  . heparin  5,000 Units Subcutaneous Q8H  . insulin aspart  2-6 Units Subcutaneous Q4H  . levothyroxine  12.5 mcg  Intravenous Daily  . mupirocin ointment  1 application Nasal BID  . pantoprazole (PROTONIX) IV  40 mg Intravenous Q24H    Background: 71 y.o. male recently discharged to a SNF after the 3rd left toe was amputated on 10/20/16.  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  During that July admission there was AKI with a K of 7.5  creat up to 3.53 dropping to 0.79. Baseline creat is about 1.2  At the SNF he had issues with diff voiding and received inadvertently high doses of narcotics. He had decreased PO intake over last several days and depressed mentation.  He was transferred from the SNF to ED with weakness, BP systolic in 56D upon arrival to ED, and c/o pain "all over" BUN was 202, creat 8.48 and K > 7.5, bicarb of 12  Hgb was 12.5g compared with 9.7g on 10/23/16.  WBC 19,800 w/L shift. We were asked to see for AKI, hyperkalemia, metabolic acidosis.  Assessment/Recommendations  1. AKI due to hypovolemia w/shock - excellent UOP. Current fluids isotonic bicarb at 150 per hour. Still quite dry by exam. Bicarb up to 19. Once over 20-22 can change to NS. Creatinine is falling nicely. 2. CKD 3 - baseline creatinine 1.2 3. Dehydration - severe. 4. R/O sepsis - foot is issue. Culture pending. VVS has seen about ischemic foot and does not feel septic from it. 5. r/o urinary obstruction - voiding probs reported at SNF (per niece) - leave foley in. 6. Hyperkalemia - continued improvement with conservative management 7. Met acidosis - on bicarb drip and improving. 8. Hx of Hep C Ab+  Jamal Maes, MD [facility] [phone] Pager 11/18/2016, 9:53 AM

## 2016-11-18 NOTE — Progress Notes (Signed)
PULMONARY / CRITICAL CARE MEDICINE   Name: Ronald Simpson Herber MRN: [id number] DOB: 10/30/1945    ADMISSION DATE:  11/17/2016   CONSULTATION DATE:  11/17/2016  REFERRING MD:  Dr. Marcina MillardMizera, EDP   CHIEF COMPLAINT:  Sepsis   HISTORY OF PRESENT ILLNESS:   71 year old male with PMH of CAD, Anemia, Chronic combined systolic/diastolic HF, Hep C with Cirrhosis, CKD stage 3, DM, PVD s/p amputation of left side third/fourth/fifith toe and AKA of right extremity. Presents to ED on 8/6 with [facility] for weakness. Upon arrival was noted to be hypotensive with systolic 60-70s. Potassium >7.5, Creat 8.48, LA 1.43. Recent amputation site of left third toe site noted to be down to bone. Patient given 2L NS with improvement of BP. Nephrology consulted. PCCM asked to admit. Recent Admission 7/6-7/12 in setting of sepsis secondary to nonhealing left foot wound with amputation on 7/9 of left third toe (discharged on 2 weeks of doxycycline and Augmentin) and hyperkalemia/acute on chronic kidney injury.   SUBJECTIVE:  No acute events overnight. Patient did not require vasopressors. Remains altered.   REVIEW OF SYSTEMS:  Unable to obtain with altered mentation.   VITAL SIGNS: BP (!) 91/58   Pulse 79   Temp (!) 97.5 F (36.4 C) (Oral)   Resp 16   Ht 5\' 10"  (1.778 m)   Wt 126 lb 5.2 oz (57.3 kg)   SpO2 100%   BMI 18.13 kg/m   HEMODYNAMICS:    VENTILATOR SETTINGS:    INTAKE / OUTPUT: I/O last 3 completed shifts: In: 3875 [I.V.:1375; IV Piggyback:2500] Out: 1300 [Urine:1300]  PHYSICAL EXAMINATION: General:  Cachectic male. Awake. Moaning.  Neuro:  Spontaneously moving extremities. No meningismus. Oriented to year but not place or president.  HEENT:  Tacky mucus membranes. Poor dentition. No scleral icterus.  Cardiovascular:  Regular rate. Regular rhythm. No edema. Ischemia left great toe. Lungs:  Normal work of breathing on room air. Clear with auscultation.  Abdomen:  Soft. Nontender. Normal bowel sounds.   Musculoskeletal:  Left foot toe amputations w/ visible bone. Skin:  Warm. Dry. No rash on exposed skin.   LABS:  BMET  Recent Labs Lab 11/17/16 2045 11/18/16 0012 11/18/16 0601 11/18/16 0900  NA 131* 135 137  --   K 6.6* 7.0* 5.8* 5.1  CL 101 103 100*  --   CO2 12* 14* 19*  --   BUN 197* 183* 172*  --   CREATININE 7.92* 7.33* 6.16*  --   GLUCOSE 292* 114* 126*  --     Electrolytes  Recent Labs Lab 11/17/16 2045 11/18/16 0012 11/18/16 0601  CALCIUM 8.6* 9.5 9.2  MG  --   --  2.0  PHOS  --   --  5.5*    CBC  Recent Labs Lab 11/17/16 1755 11/18/16 0601  WBC 19.8* 17.3*  HGB 12.5* 12.6*  HCT 39.4 38.4*  PLT 332 298    Coag's No results for input(s): APTT, INR in the last 168 hours.  Sepsis Markers  Recent Labs Lab 11/17/16 1809 11/17/16 2050 11/18/16 0012 11/18/16 0601  LATICACIDVEN 1.43 0.93  --   --   PROCALCITON  --   --  0.72 0.43    ABG  Recent Labs Lab 11/17/16 0038  PHART 7.370  PCO2ART 19.7*  PO2ART 112*    Liver Enzymes  Recent Labs Lab 11/17/16 1755  AST 17  ALT 10*  ALKPHOS 186*  BILITOT 1.3*  ALBUMIN 2.6*    Cardiac Enzymes No results  for input(s): TROPONINI, PROBNP in the last 168 hours.  Glucose  Recent Labs Lab 11/17/16 2042 11/18/16 0015 11/18/16 0348  GLUCAP 266* 109* 108*    Imaging Dg Chest Port 1 View  Result Date: 11/17/2016 CLINICAL DATA:  Hypotension. EXAM: PORTABLE CHEST 1 VIEW COMPARISON:  Radiograph of Sep 02, 2016. FINDINGS: The heart size and mediastinal contours are within normal limits. Status post cardiac valve repair. No pneumothorax or pleural effusion is noted. Left lung is clear. Nodular density is seen in right lung base. No consolidative process is noted. The visualized skeletal structures are unremarkable. IMPRESSION: Nodular density seen in right lung base which may represent overlying nipple shadow repeat radiograph with nipple markers is recommended to rule out pulmonary nodule. No  other acute abnormality is noted. Electronically Signed   By: Lupita Raider, M.D.   On: 11/17/2016 19:36   Dg Foot Complete Left  Result Date: 11/17/2016 CLINICAL DATA:  History of gangrene of left foot and toes and status post recent left third toe amputation on 10/20/2016. Left foot wound. History of diabetes. EXAM: LEFT FOOT - COMPLETE 3+ VIEW COMPARISON:  10/17/2016 FINDINGS: Interval metatarsal amputation of the third toe. AP view shows focal erosion involving the lateral aspect of the second metatarsal head which appears new. This is concerning for potential osteomyelitis. No fracture identified. Stable extensive vascular calcifications. Stable retained metallic foreign bodies. IMPRESSION: Erosion of the lateral aspect of the left second metatarsal head is concerning for focal osteomyelitis. Electronically Signed   By: Irish Lack M.D.   On: 11/17/2016 19:39     STUDIES:  PORT CXR 8/6:  Personally reviewed by me. Questionable right lower lobe nodule. No pleural effusion appreciated.  L FOOT X-RAY 8/6:  Erosion of the lateral aspect of the left second metatarsal head is concerning for focal osteomyelitis. RENAL U/S 8/6 >>> TTE 8/7 >>>  MICROBIOLOGY: MRSA PCR 7/6:  Positive Blood Cultures x2 7/6:  1/2 Bottles Coag Neg Staph Urine Culture 7/7:  Multiple Species Present MRSA PCR 8/6 >>> Blood Ctx 8/6 >>> Urine Culture 8/7 >>>  ANTIBIOTICS: Vancomycin 8/6 >>> Zosyn 8/6 >>>  SIGNIFICANT EVENTS: 7/09 - Amputation of third left toe  8/06 - Presents to ED   LINES/TUBES: Foley >>> PIV  DISCUSSION:  71 y.o. male with recent amputation left third toe secondary to gangrene. Presents with sepsis secondary to osteomyelitis and left foot with visible bone and evidence of ischemia. Patient continues to have altered mentation. Blood pressure has stabilized with fluid resuscitation.   ASSESSMENT / PLAN:  PULMONARY A: Right Lower Lung Nodule  P:   Checking CT Chest w/o Continuous  pulse oximetry monitoring  CARDIOVASCULAR A:  Hypotension: Secondary to hypovolemia in the setting of sepsis. Resolved. History of chronic systolic congestive heart failure History of coronary artery disease status post CABG History of peripheral arterial disease History of paroxysmal atrial fibrillation: Not on anticoagulation due to prior GI bleeding.  P:  Bolus 1 L NS IV team to establish better access Awaiting echocardiogram Vascular surgery consulted   RENAL A:   Acute on chronic renal failure stage III: Improving. Metabolic acidosis: Improving. Hyperkalemia: Resolved.  P:   Trending urine output with Foley Monitoring electrolytes and renal function daily Renal ultrasound pending Continuing bicarbonate drip Nephrology following  GASTROINTESTINAL A:   Severe protein calorie malnutrition History of hepatitis C with liver cirrhosis  P:   Nothing by mouth pending improvement in mental status  HEMATOLOGIC A:   Anemia: Chronic.  P:  Trending cell counts daily with CBC SCD Heparin subcutaneous every 8 hours  INFECTIOUS A:   Sepsis: Secondary to osteomyelitis.  Osteomyelitis: Left foot. Recent amputation left foot.  P:   Continuing empiric antibiotic coverage with vancomycin and Zosyn Trending leukocytosis Awaiting culture results   ENDOCRINE A:  Diabetes mellitus type 2: Glucose controlled. Question possible low-level diabetic ketoacidosis. Hypothyroidism   P:   Continuing IV Synthroid Continuing Accu-Cheks every 4 hours Insulin per standard sliding scale algorithm  Holding home insulin regimen   NEUROLOGIC A:    Acute encephalopathy: Likely multifactorial from uremia versus toxic metabolic encephalopathy. History of CVA   P:   Avoiding sedating medications holding on lactulose (ammonia 42)  FAMILY  - Updates:  No family at bedside at the time of my rounds.   - Inter-disciplinary family meet or Palliative Care meeting due by: 11/24/2016    I have spent a total of 31 minutes of critical care time today caring for the patient and reviewing the patient's electronic medical record.    Donna Christen Jamison Neighbor, M.D. [facility] Pulmonary & Critical Care Pager:  [phone] After 3pm or if no response, call [phone] 10:32 AM 11/18/16

## 2016-11-18 NOTE — Progress Notes (Signed)
Inpatient Diabetes Program Recommendations  AACE/ADA: New Consensus Statement on Inpatient Glycemic Control (2015)  Target Ranges:  Prepandial:   less than 140 mg/dL      Peak postprandial:   less than 180 mg/dL (1-2 hours)      Critically ill patients:  140 - 180 mg/dL   Lab Results  Component Value Date   GLUCAP 108 (H) 11/18/2016   HGBA1C 7.4 (H) 10/20/2016    Review of Glycemic Control  Results for [name], Ronald (MRN [id number]) as of 11/18/2016 11:23  Ref. Range 11/17/2016 20:42 11/18/2016 00:15 11/18/2016 03:48  Glucose-Capillary Latest Ref Range: 65 - 99 mg/dL 409266 (H) 811109 (H) 914108 (H)   Diabetes history: Type 2  Outpatient Diabetes medications: Levemir 8 units qday Current orders for Inpatient glycemic control: Novolog 2-6 units q4h  Inpatient Diabetes Program Recommendations: agree with current orders for blood sugar management.  Susette RacerJulie Wilburn Keir, RN, BA, MHA, CDE Diabetes Coordinator Inpatient Diabetes Program  [phone] (Team Pager) [phone] [facility] Office) 11/18/2016 11:27 AM

## 2016-11-18 NOTE — Progress Notes (Signed)
Patient ID: Mohsin Crum, male   DOB: 07-18-1945, 70 y.o.   MRN: [id number]                                     Patient name: Corvin Sorbo MRN: [id number] DOB: 1946-04-07 Sex: male    HPI: Mendell Bontempo is a 71 y.o. male well-known to me from prior fourth and fifth toe proximal 6 months ago. He had healing of this but then had breakdown of his third toe open third toe amputation by myself once 10/20/2016. I'd seen him in the office with some erosion of his second toe. The open amputation site actually was healing lateral aspect with some possible involvement of the second metatarsal head. He was admitted last night with the medical deterioration. Hyperkalemia and markedly elevated creatinine.  Current Facility-Administered Medications  Medication Dose Route Frequency Provider Last Rate Last Dose  . 0.9 %  sodium chloride infusion  250 mL Intravenous PRN Tobey Grim, NP      . acetaminophen (OFIRMEV) IV 1,000 mg  1,000 mg Intravenous Q6H PRN Tobey Grim, NP   Stopped at 11/18/16 [id number]  . albuterol (PROVENTIL,VENTOLIN) solution continuous neb  10 mg/hr Nebulization Continuous Tegeler, Canary Brim, MD 2 mL/hr at 11/17/16 2023 10 mg/hr at 11/17/16 2023  . heparin injection 5,000 Units  5,000 Units Subcutaneous Q8H Jovita Kussmaul M, NP      . insulin aspart (novoLOG) injection 2-6 Units  2-6 Units Subcutaneous Q4H Tobey Grim, NP      . levothyroxine (SYNTHROID, LEVOTHROID) injection 12.5 mcg  12.5 mcg Intravenous Daily Jovita Kussmaul M, NP      . pantoprazole (PROTONIX) injection 40 mg  40 mg Intravenous Q24H Jovita Kussmaul M, NP      . phenylephrine (NEO-SYNEPHRINE) 10 mg in sodium chloride 0.9 % 250 mL (0.04 mg/mL) infusion  0-200 mcg/min Intravenous Titrated Tobey Grim, NP      . piperacillin-tazobactam (ZOSYN) IVPB 2.25 g  2.25 g Intravenous Q8H Dion Body, [facility]   Stopped at 11/18/16 0450  . sodium bicarbonate 150 mEq in sterile water 1,000 mL  infusion   Intravenous Continuous Lauris Poag, MD 150 mL/hr at 11/18/16 0530       PHYSICAL EXAM: Vitals:   11/18/16 0530 11/18/16 0600 11/18/16 0630 11/18/16 0700  BP: 107/84 114/78 103/60 (!) 91/57  Pulse: 95 92 89 84  Resp: (!) 21 18 20 12   Temp:      TempSrc:      SpO2: 100% 100% 95% 99%  Weight:      Height:        GENERAL: The patient is a well-nourished male, in no acute distress. The vital signs are documented above. He continues to have an easily palpable left popliteal pulse. The amputation wound from the fourth and fifth toe is completely healed. Does have open amputation of the third toe with some necrotic debris on the medial aspect of the second metatarsal head. He now also has dry gangrene tip of his great toe.  MEDICAL ISSUES: Chronically ill patient with severe malnutrition. Has lost an extreme amount of weight over the past 6 months. Now with renal failure as well. Discussed the need for eventual either transmetatarsal amputation or below-knee amputation. Quite concerned that he has had all of this difficulty in healing toe amputations with a palpable popliteal pulse and arteriogram demonstrating patent peroneal artery to  the ankle. The patient is NOT septic from his foot. He does have x-ray changes suggesting focal osteo-at the second metatarsal head. He has no fluctuance no erythema of his foot. Will continue to follow. Will need either transmetatarsal amputation or below-knee amputation during this admission. Patient had been made nothing by mouth on admission last night. No recent acute him nothing by mouth from vascular standpoint   Larina Earthlyodd F. Skyleen Bentley, MD [facility] Office Tel [phone] Pager [phone]

## 2016-11-18 NOTE — Progress Notes (Signed)
PCCM Attending Re-Rounding Note:  Patient now has IV access established. Mental status remains altered. BP stabilized. Transferring patient to telemetry bed. TRH to assume care & PCCM off as of 8/8.   Donna ChristenJennings E. Jamison NeighborNestor, M.D. [facility] Pager:  [phone] After 3pm or if no response, call [phone] 3:58 PM 11/18/16

## 2016-11-18 NOTE — Progress Notes (Signed)
Pharmacy Antibiotic Note  Ronald Simpson is a 71 y.o. male admitted on 11/17/2016 with sepsis.    Plan: Continue Zosyn 2.25 g IV q8h Vanc 750 mg q48h Monitor renal fx, cx, vt prn Need for HD?  Height: 5\' 10"  (177.8 cm) Weight: 126 lb 5.2 oz (57.3 kg) IBW/kg (Calculated) : 73  Temp (24hrs), Avg:97.2 F (36.2 C), Min:96.5 F (35.8 C), Max:97.5 F (36.4 C)   Recent Labs Lab 11/17/16 1755 11/17/16 1809 11/17/16 2045 11/17/16 2050 11/18/16 0012 11/18/16 0601  WBC 19.8*  --   --   --   --  17.3*  CREATININE 8.48*  --  7.92*  --  7.33* 6.16*  LATICACIDVEN  --  1.43  --  0.93  --   --     Estimated Creatinine Clearance: 9.2 mL/min (A) (by C-G formula based on SCr of 6.16 mg/dL (H)).    Allergies  Allergen Reactions  . Hydralazine Other (See Comments)    4/21-4/26/18 pleuritic chest pain with bilateral effusions and pericardial rub. Rule out hydralazine drug-induced lupus   . Percocet [Oxycodone-Acetaminophen] Other (See Comments)    3 episodes of unresponsiveness & apnea; he has received a total of 5 doses of Narcan to date for res[facility] Hx of drug abuse (cocaine, canabis,alcohol, tobacco) Opioids CONTRAINDICATED !!!   . Tramadol Other (See Comments)    Excess sedation; ? Related to pre-existing hepatic dysfunction with PMH Hep C  . Remeron [Mirtazapine]    Ronald Simpson, PharmD, BCPS, BCCCP Clinical Pharmacist Clinical phone for 11/18/2016 from 7a-3:30p: [phone] If after 3:30p, please call main pharmacy at: [phone] 11/18/2016 8:46 AM

## 2016-11-18 NOTE — Progress Notes (Signed)
  Echocardiogram 2D Echocardiogram has been performed.  Janalyn HarderWest, Macrae Wiegman R 11/18/2016, 10:07 AM

## 2016-11-18 NOTE — Progress Notes (Signed)
CRITICAL VALUE ALERT  Critical Value Ph 7.37, CO@ 19.7, and Bicar 11.1  Date & Time Notied: 0005 11/19/15  Provider Notified: Idolina PrimerEubanks K MD  Orders Received/Actions taken: No new orders at this time

## 2016-11-19 ENCOUNTER — Inpatient Hospital Stay (HOSPITAL_COMMUNITY): Payer: Medicare Other

## 2016-11-19 DIAGNOSIS — I96 Gangrene, not elsewhere classified: Secondary | ICD-10-CM

## 2016-11-19 DIAGNOSIS — K7460 Unspecified cirrhosis of liver: Secondary | ICD-10-CM

## 2016-11-19 LAB — GLUCOSE, CAPILLARY
GLUCOSE-CAPILLARY: 150 mg/dL — AB (ref 65–99)
GLUCOSE-CAPILLARY: 162 mg/dL — AB (ref 65–99)
GLUCOSE-CAPILLARY: 82 mg/dL (ref 65–99)
GLUCOSE-CAPILLARY: 90 mg/dL (ref 65–99)
Glucose-Capillary: 93 mg/dL (ref 65–99)
Glucose-Capillary: 101 mg/dL — ABNORMAL HIGH (ref 65–99)
Glucose-Capillary: 150 mg/dL — ABNORMAL HIGH (ref 65–99)

## 2016-11-19 LAB — CBC
HEMATOCRIT: 32.8 % — AB (ref 39.0–52.0)
Hemoglobin: 10.7 g/dL — ABNORMAL LOW (ref 13.0–17.0)
MCH: 27.4 pg (ref 26.0–34.0)
MCHC: 32.6 g/dL (ref 30.0–36.0)
MCV: 84.1 fL (ref 78.0–100.0)
PLATELETS: 258 10*3/uL (ref 150–400)
RBC: 3.9 MIL/uL — ABNORMAL LOW (ref 4.22–5.81)
RDW: 19.6 % — ABNORMAL HIGH (ref 11.5–15.5)
WBC: 16.7 10*3/uL — AB (ref 4.0–10.5)

## 2016-11-19 LAB — RENAL FUNCTION PANEL
Albumin: 2 g/dL — ABNORMAL LOW (ref 3.5–5.0)
Anion gap: 16 — ABNORMAL HIGH (ref 5–15)
BUN: 138 mg/dL — AB (ref 6–20)
CHLORIDE: 95 mmol/L — AB (ref 101–111)
CO2: 30 mmol/L (ref 22–32)
CREATININE: 3.84 mg/dL — AB (ref 0.61–1.24)
Calcium: 8.2 mg/dL — ABNORMAL LOW (ref 8.9–10.3)
GFR calc Af Amer: 17 mL/min — ABNORMAL LOW (ref >60)
GFR, EST NON AFRICAN AMERICAN: 15 mL/min — AB (ref >60)
GLUCOSE: 88 mg/dL (ref 65–99)
POTASSIUM: 3.5 mmol/L (ref 3.5–5.1)
Phosphorus: 3.6 mg/dL (ref 2.5–4.6)
Sodium: 141 mmol/L (ref 135–145)

## 2016-11-19 LAB — URINE CULTURE: SPECIAL REQUESTS: NORMAL

## 2016-11-19 LAB — PROCALCITONIN: PROCALCITONIN: 0.28 ng/mL

## 2016-11-19 LAB — MAGNESIUM: MAGNESIUM: 1.6 mg/dL — AB (ref 1.7–2.4)

## 2016-11-19 MED ORDER — ENSURE ENLIVE PO LIQD
237.0000 mL | Freq: Three times a day (TID) | ORAL | Status: DC
  Administered 2016-11-19 – 2016-11-23 (×8): 237 mL via ORAL

## 2016-11-19 MED ORDER — MAGNESIUM SULFATE 2 GM/50ML IV SOLN
2.0000 g | Freq: Once | INTRAVENOUS | Status: AC
  Administered 2016-11-19: 2 g via INTRAVENOUS
  Filled 2016-11-19: qty 50

## 2016-11-19 MED ORDER — DEXTROSE-NACL 5-0.45 % IV SOLN
INTRAVENOUS | Status: DC
  Administered 2016-11-19 – 2016-11-20 (×2): via INTRAVENOUS
  Filled 2016-11-19 (×7): qty 1000

## 2016-11-19 MED ORDER — INSULIN ASPART 100 UNIT/ML ~~LOC~~ SOLN
2.0000 [IU] | Freq: Three times a day (TID) | SUBCUTANEOUS | Status: DC
  Administered 2016-11-19: 2 [IU] via SUBCUTANEOUS
  Administered 2016-11-19: 4 [IU] via SUBCUTANEOUS
  Administered 2016-11-20: 2 [IU] via SUBCUTANEOUS
  Administered 2016-11-20 – 2016-11-22 (×4): 4 [IU] via SUBCUTANEOUS
  Administered 2016-11-23: 2 [IU] via SUBCUTANEOUS

## 2016-11-19 MED ORDER — ACETAMINOPHEN 325 MG PO TABS
650.0000 mg | ORAL_TABLET | Freq: Four times a day (QID) | ORAL | Status: DC | PRN
  Administered 2016-11-19 – 2016-11-20 (×3): 650 mg via ORAL
  Filled 2016-11-19 (×4): qty 2

## 2016-11-19 NOTE — Progress Notes (Signed)
[facility] Rounding Note  Subjective/Interval History:  Continues to make good urine, renal function improving daily Still on a bicarb drip  Objective Vital signs in last 24 hours: Vitals:   11/19/16 0424 11/19/16 0500 11/19/16 0600 11/19/16 0743  BP:  108/65 (!) 113/55   Pulse:  79 87   Resp:  (!) 24 18   Temp:    98.6 F (37 C)  TempSrc:    Oral  SpO2:  100% 100%   Weight: 60.4 kg (133 lb 2.5 oz)     Height:       Weight change: -3.103 kg (-6 lb 13.5 oz)  Intake/Output Summary (Last 24 hours) at 11/19/16 0845 Last data filed at 11/19/16 0600  Gross per 24 hour  Intake             3420 ml  Output             2050 ml  Net             1370 ml   Physical Exam:  Blood pressure (!) 113/55, pulse 87, temperature 98.6 F (37 C), temperature source Oral, resp. rate 18, height 5' 10"  (1.778 m), weight 60.4 kg (133 lb 2.5 oz), SpO2 100 %.  Elderly WM  Tacky MM's  No JVD in the supine position Lungs clear S1SD2 No S3 Abd scaphoid not tender No LE edema Ischemic remaining toes 1,2 left foot R AKA  Labs:  Recent Labs Lab 11/17/16 1755 11/17/16 2045 11/18/16 0012 11/18/16 0601 11/18/16 0900 11/18/16 1957 11/19/16 0217  NA 133* 131* 135 137  --  139 141  K >7.5* 6.6* 7.0* 5.8* 5.1 4.4 3.5  CL 100* 101 103 100*  --  98* 95*  CO2 11* 12* 14* 19*  --  26 30  GLUCOSE 142* 292* 114* 126*  --  85 88  BUN 202* 197* 183* 172*  --  153* 138*  CREATININE 8.48* 7.92* 7.33* 6.16*  --  4.61* 3.84*  CALCIUM 9.2 8.6* 9.5 9.2  --  8.4* 8.2*  PHOS  --   --   --  5.5*  --   --  3.6    Recent Labs Lab 11/17/16 1755 11/19/16 0217  AST 17  --   ALT 10*  --   ALKPHOS 186*  --   BILITOT 1.3*  --   PROT 8.8*  --   ALBUMIN 2.6* 2.0*    Recent Labs Lab 11/18/16 0012  AMMONIA 42*     Recent Labs Lab 11/17/16 1755 11/18/16 0601 11/19/16 0217  WBC 19.8* 17.3* 16.7*  NEUTROABS 17.4*  --   --   HGB 12.5* 12.6* 10.7*  HCT 39.4 38.4* 32.8*  MCV 87.2 85.1 84.1  PLT 332 298 258      Recent Labs Lab 11/18/16 1547 11/18/16 2106 11/19/16 0011 11/19/16 0406 11/19/16 0741  GLUCAP 93 86 90 82 93   Studies/Results: US Renal  Result Date: 11/19/2016 CLINICAL DATA:  Acute onset of renal insufficiency. Initial encounter. EXAM: RENAL / URINARY TRACT ULTRASOUND COMPLETE COMPARISON:  CT of the abdomen and pelvis performed 12/27/2015 FINDINGS: Right Kidney: Length: 11.6 cm. Echogenicity within normal limits. No mass or hydronephrosis visualized. Left Kidney: Length: 9.1 cm. Echogenicity within normal limits. No mass or hydronephrosis visualized. Bladder: Decompressed, with a Foley catheter in place. A stone is noted within the gallbladder. IMPRESSION: 1. Unremarkable renal ultrasound. 2. Cholelithiasis.  Gallbladder otherwise grossly unremarkable. Electronically Signed   By: Francoise Schaumann.D.  On: 11/19/2016 03:33   Dg Chest Port 1 View  Result Date: 11/17/2016 CLINICAL DATA:  Hypotension. EXAM: PORTABLE CHEST 1 VIEW COMPARISON:  Radiograph of Sep 02, 2016. FINDINGS: The heart size and mediastinal contours are within normal limits. Status post cardiac valve repair. No pneumothorax or pleural effusion is noted. Left lung is clear. Nodular density is seen in right lung base. No consolidative process is noted. The visualized skeletal structures are unremarkable. IMPRESSION: Nodular density seen in right lung base which may represent overlying nipple shadow repeat radiograph with nipple markers is recommended to rule out pulmonary nodule. No other acute abnormality is noted. Electronically Signed   By: Marijo Conception, M.D.   On: 11/17/2016 19:36   Dg Foot Complete Left  Result Date: 11/17/2016 CLINICAL DATA:  History of gangrene of left foot and toes and status post recent left third toe amputation on 10/20/2016. Left foot wound. History of diabetes. EXAM: LEFT FOOT - COMPLETE 3+ VIEW COMPARISON:  10/17/2016 FINDINGS: Interval metatarsal amputation of the third toe. AP view shows  focal erosion involving the lateral aspect of the second metatarsal head which appears new. This is concerning for potential osteomyelitis. No fracture identified. Stable extensive vascular calcifications. Stable retained metallic foreign bodies. IMPRESSION: Erosion of the lateral aspect of the left second metatarsal head is concerning for focal osteomyelitis. Electronically Signed   By: Aletta Edouard M.D.   On: 11/17/2016 19:39   Medications: . sodium chloride Stopped (11/18/16 1100)  . magnesium sulfate 1 - 4 g bolus IVPB    . piperacillin-tazobactam (ZOSYN)  IV Stopped (11/19/16 0448)  .  sodium bicarbonate (isotonic) infusion in sterile water 150 mL/hr at 11/19/16 0535  . vancomycin     . Chlorhexidine Gluconate Cloth  6 each Topical Q0600  . heparin  5,000 Units Subcutaneous Q8H  . insulin aspart  2-6 Units Subcutaneous TID WC  . levothyroxine  25 mcg Oral QAC breakfast  . mupirocin ointment  1 application Nasal BID  . pantoprazole  40 mg Oral Daily    Background: 71 y.o. male recently discharged to a SNF after the 3rd left toe was amputated on 10/20/16.  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  During that July admission there was AKI with a K of 7.5  creat up to 3.53 dropping to 0.79. Baseline creat is about 1.2  At the SNF he had issues with diff voiding and received inadvertently high doses of narcotics. He had decreased PO intake over last several days and depressed mentation.  He was transferred from the SNF to ED with weakness, BP systolic in 38V upon arrival to ED, and c/o pain "all over" BUN was 202, creat 8.48 and K > 7.5, bicarb of 12  Hgb was 12.5g compared with 9.7g on 10/23/16.  WBC 19,800 w/L shift. We were asked to see for AKI, hyperkalemia, metabolic acidosis.  Assessment/Recommendations  1. AKI due to hypovolemia w/shock - excellent UOP. Current fluids isotonic bicarb at 150 per hour. Hydration status improving. Bicarb now 30, Na  creeping up. Will likely have some ongoing osmotic diuresis 2/2 severe azotemia. Recommend changing fluids to D5 1/2 NS and continue at current rate.  2. CKD 3 - baseline creatinine 1.2 3. Dehydration - severe on admission. Volume status improving. 4. R/O sepsis - foot is issue. Culture pending. [facility]VS has seen about ischemic foot and does not feel septic from it. 5. r/o urinary obstruction - voiding  probs reported at SNF (per niece) - leave foley in. 6. Hyperkalemia - continued improvement with conservative management 7. Met acidosis - resolved with bicarb drip 8. Hx of Hep C Ab+  Have changed to D5!/2 NS at 150. Anticipate continued renal recovery. No other recommendations.  Renal will sign off at this time. Please call if questions.   Jamal Maes, MD [facility] [phone] Pager 11/19/2016, 8:45 AM

## 2016-11-19 NOTE — Care Management Note (Signed)
Case Management Note  Patient Details  Name: Ronald Simpson MRN: [id number] Date of Birth: Aug 13, 1945  Subjective/Objective:     Pt has scheduled BKA or transmetatarsal for 8/10.  Pt had previous toes amputations that are non healing           Action/Plan:   PTA from [facility] - CSW consulted 11/19/16.  CM will continue to follow for discharge needs   Expected Discharge Date:                  Expected Discharge Plan:  Skilled Nursing Facility (From [facility] - [facility] )  In-House Referral:  Clinical Social Work  Discharge planning Services  CM Consult  Post Acute Care Choice:    Choice offered to:     DME Arranged:    DME Agency:     HH Arranged:    HH Agency:     Status of Service:     If discussed at [employer] Meetings, dates discussed:    Additional Comments:  Cherylann ParrClaxton, Marit Goodwill S, RN 11/19/2016, 12:03 PM

## 2016-11-19 NOTE — Progress Notes (Signed)
PROGRESS NOTE    Ronald Simpson [place]   ION:[id number]  DOB: 1946-03-17  DOA: 11/17/2016 PCP: [name], William F, MD   Brief Narrative:  Ronald Simpson Ronald Simpson is coming from SNF with a PMH of CAD, Anemia, Chronic combined systolic/diastolic HF, Hep C with Cirrhosis, CKD stage 3, DM, PVD s/p amputation of left side third/fourth/fifith toe and AKA of right extremity.  Presents to ED on 8/6 with [facility] for weakness. Upon arrival was noted to be hypotensive with systolic 60-70s. Potassium >7.5, Creat 8.48, LA 1.43. Noted to have dry gangrene of left 2 toes.  Recent Admission 7/6-7/12  for sepsis secondary to nonhealing left foot wound, AKI and hyperkalemia. He underwent amputation on 7/9 of left third toe discharged on 2 weeks of doxycycline and Augmentin to SNF.     Subjective: No complaints.  ROS: no complaints of nausea, vomiting, constipation diarrhea, cough, dyspnea or dysuria. No other complaints.   Assessment & Plan:   Principal Problem:   AKI (acute kidney injury)/ hypotension/ hyperkalemia -  second admission for the same in setting of Aldactone, Entresto, Dig, Demedex - appreciate Nephro f/u - will need to cut back on diuretics that he is on for CHF especially as his EF has improved- see below - no sepsis- cultures negative x 2 days- d/c antibiotics  Active Problems: Chronic combined systolic/diastolic HF - EF previously 35-40 % on ECHO in 4/18 however repeat ECHO on 8/7 show improvement in EF to 65-70% - on Coreg, Aldactone, Entresto, Dig, Demedex all of which are on hold at this time  Dry Gangrene left 2 toes/ PAD - 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,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 - vascular surgery has evaluated him and plans on either at transmet vs BKA  Diabetes mellitus with peripheral vascular disease  - Levemir on hold- cont SSI  H/o CAD/CABG - aspirin 81 mg and statin    Hypothyroidism, acquired - Synthroid    Benign essential HTN - holding meds as mentioned above     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  PAF - not on anticoagulation due to GI bleed in the past  Severely malnourished   DVT prophylaxis: Heparin Code Status: Full Family Communication: wife and daughter Disposition Plan:  Consultants:   Vascular surgery  PCCM Procedures:     Antimicrobials:  Anti-infectives    Start     Dose/Rate Route Frequency Ordered Stop   11/19/16 1800  vancomycin (VANCOCIN) IVPB 750 mg/150 ml premix     750 mg 150 mL/hr over 60 Minutes Intravenous Every 48 hours 11/18/16 0846     11/18/16 0400  piperacillin-tazobactam (ZOSYN) IVPB 2.25 g     2.25 g 100 mL/hr over 30 Minutes Intravenous Every 8 hours 11/17/16 2037     11/17/16 1900  piperacillin-tazobactam (ZOSYN) IVPB 3.375 g     3.375 g 100 mL/hr over 30 Minutes Intravenous  Once 11/17/16 1852 11/17/16 1946   11/17/16 1900  vancomycin (VANCOCIN) IVPB 1000 mg/200 mL premix     1,000 mg 200 mL/hr over 60 Minutes Intravenous  Once 11/17/16 1852 11/17/16 2017       Objective: Vitals:   11/19/16 1000 11/19/16 1100 11/19/16 1127 11/19/16 1200  BP: 137/79 133/71  116/60  Pulse: 98 89  82  Resp: 14 16  17   Temp:   98.3 F (36.8 C)   TempSrc:   Oral   SpO2: 100% 100%  100%  Weight:      Height:  Intake/Output Summary (Last 24 hours) at 11/19/16 1820 Last data filed at 11/19/16 1200  Gross per 24 hour  Intake             2405 ml  Output             2150 ml  Net              255 ml   Filed Weights   11/17/16 1758 11/18/16 0425 11/19/16 0424  Weight: 63.5 kg (140 lb) 57.3 kg (126 lb 5.2 oz) 60.4 kg (133 lb 2.5 oz)    Examination: General exam: Appears comfortable  HEENT: PERRLA, oral mucosa moist, no sclera icterus or thrush Respiratory system: Clear to auscultation. Respiratory effort normal. Cardiovascular system: S1 & S2 heard, RRR.  No murmurs  Gastrointestinal system: Abdomen soft, non-tender, nondistended. Normal bowel sound. No organomegaly Central nervous system: Alert and oriented. No  focal neurological deficits. Extremities: No cyanosis, clubbing or edema-  Skin: No rashes or ulcers - gangrene of left 1st and second toes Psychiatry:  Mood & affect appropriate.     Data Reviewed: I have personally reviewed following labs and imaging studies  CBC:  Recent Labs Lab 11/17/16 1755 11/18/16 0601 11/19/16 0217  WBC 19.8* 17.3* 16.7*  NEUTROABS 17.4*  --   --   HGB 12.5* 12.6* 10.7*  HCT 39.4 38.4* 32.8*  MCV 87.2 85.1 84.1  PLT 332 298 258   Basic Metabolic Panel:  Recent Labs Lab 11/17/16 2045 11/18/16 0012 11/18/16 0601 11/18/16 0900 11/18/16 1957 11/19/16 0217  NA 131* 135 137  --  139 141  K 6.6* 7.0* 5.8* 5.1 4.4 3.5  CL 101 103 100*  --  98* 95*  CO2 12* 14* 19*  --  26 30  GLUCOSE 292* 114* 126*  --  85 88  BUN 197* 183* 172*  --  153* 138*  CREATININE 7.92* 7.33* 6.16*  --  4.61* 3.84*  CALCIUM 8.6* 9.5 9.2  --  8.4* 8.2*  MG  --   --  2.0  --   --  1.6*  PHOS  --   --  5.5*  --   --  3.6   GFR: Estimated Creatinine Clearance: 15.5 mL/min (A) (by C-G formula based on SCr of 3.84 mg/dL (H)). Liver Function Tests:  Recent Labs Lab 11/17/16 1755 11/19/16 0217  AST 17  --   ALT 10*  --   ALKPHOS 186*  --   BILITOT 1.3*  --   PROT 8.8*  --   ALBUMIN 2.6* 2.0*   No results for input(s): LIPASE, AMYLASE in the last 168 hours.  Recent Labs Lab 11/18/16 0012  AMMONIA 42*   Coagulation Profile: No results for input(s): INR, PROTIME in the last 168 hours. Cardiac Enzymes: No results for input(s): CKTOTAL, CKMB, CKMBINDEX, TROPONINI in the last 168 hours. BNP (last 3 results) No results for input(s): PROBNP in the last 8760 hours. HbA1C: No results for input(s): HGBA1C in the last 72 hours. CBG:  Recent Labs Lab 11/19/16 0011 11/19/16 0406 11/19/16 0741 11/19/16 1126 11/19/16 1540  GLUCAP 90 82 93 150* 162*   Lipid Profile: No results for input(s): CHOL, HDL, LDLCALC, TRIG, CHOLHDL, LDLDIRECT in the last 72  hours. Thyroid Function Tests: No results for input(s): TSH, T4TOTAL, FREET4, T3FREE, THYROIDAB in the last 72 hours. Anemia Panel: No results for input(s): VITAMINB12, FOLATE, FERRITIN, TIBC, IRON, RETICCTPCT in the last 72 hours. Urine analysis:    Component Value  Date/Time   COLORURINE YELLOW 11/18/2016 0012   APPEARANCEUR TURBID (A) 11/18/2016 0012   LABSPEC 1.013 11/18/2016 0012   PHURINE 5.0 11/18/2016 0012   GLUCOSEU NEGATIVE 11/18/2016 0012   HGBUR SMALL (A) 11/18/2016 0012   BILIRUBINUR NEGATIVE 11/18/2016 0012   KETONESUR 5 (A) 11/18/2016 0012   PROTEINUR 100 (A) 11/18/2016 0012   NITRITE NEGATIVE 11/18/2016 0012   LEUKOCYTESUR MODERATE (A) 11/18/2016 0012   Sepsis Labs: @LABRCNTIP (procalcitonin:4,lacticidven:4) ) Recent Results (from the past 240 hour(s))  Culture, blood (Routine x 2)     Status: None (Preliminary result)   Collection Time: 11/17/16  5:55 PM  Result Value Ref Range Status   Specimen Description BLOOD LEFT HAND  Final   Special Requests   Final    BOTTLES DRAWN AEROBIC ONLY Blood Culture adequate volume   Culture NO GROWTH 2 DAYS  Final   Report Status PENDING  Incomplete  Culture, blood (Routine x 2)     Status: None (Preliminary result)   Collection Time: 11/17/16  6:48 PM  Result Value Ref Range Status   Specimen Description BLOOD RIGHT ANTECUBITAL  Final   Special Requests IN PEDIATRIC BOTTLE Blood Culture adequate volume  Final   Culture NO GROWTH 2 DAYS  Final   Report Status PENDING  Incomplete  MRSA PCR Screening     Status: Abnormal   Collection Time: 11/18/16 12:12 AM  Result Value Ref Range Status   MRSA by PCR POSITIVE (A) NEGATIVE Final    Comment:        The GeneXpert MRSA Assay (FDA approved for NASAL specimens only), is one component of a comprehensive MRSA colonization surveillance program. It is not intended to diagnose MRSA infection nor to guide or monitor treatment for MRSA infections. RESULT CALLED TO, READ BACK BY  AND VERIFIED WITH: D TURNER 11/18/16 @ 0750 M VESTAL   Culture, Urine     Status: Abnormal   Collection Time: 11/18/16 12:12 AM  Result Value Ref Range Status   Specimen Description URINE, RANDOM  Final   Special Requests Normal  Final   Culture >=100,000 COLONIES/mL YEAST (A)  Final   Report Status 11/19/2016 FINAL  Final         Radiology Studies: US Renal  Result Date: 11/19/2016 CLINICAL DATA:  Acute onset of renal insufficiency. Initial encounter. EXAM: RENAL / URINARY TRACT ULTRASOUND COMPLETE COMPARISON:  CT of the abdomen and pelvis performed 12/27/2015 FINDINGS: Right Kidney: Length: 11.6 cm. Echogenicity within normal limits. No mass or hydronephrosis visualized. Left Kidney: Length: 9.1 cm. Echogenicity within normal limits. No mass or hydronephrosis visualized. Bladder: Decompressed, with a Foley catheter in place. A stone is noted within the gallbladder. IMPRESSION: 1. Unremarkable renal ultrasound. 2. Cholelithiasis.  Gallbladder otherwise grossly unremarkable. Electronically Signed   By: Roanna Raider M.D.   On: 11/19/2016 03:33   Dg Chest Port 1 View  Result Date: 11/17/2016 CLINICAL DATA:  Hypotension. EXAM: PORTABLE CHEST 1 VIEW COMPARISON:  Radiograph of Sep 02, 2016. FINDINGS: The heart size and mediastinal contours are within normal limits. Status post cardiac valve repair. No pneumothorax or pleural effusion is noted. Left lung is clear. Nodular density is seen in right lung base. No consolidative process is noted. The visualized skeletal structures are unremarkable. IMPRESSION: Nodular density seen in right lung base which may represent overlying nipple shadow repeat radiograph with nipple markers is recommended to rule out pulmonary nodule. No other acute abnormality is noted. Electronically Signed   By: Roque Lias  Montez Hageman, M.D.   On: 11/17/2016 19:36   Dg Foot Complete Left  Result Date: 11/17/2016 CLINICAL DATA:  History of gangrene of left foot and toes and status post  recent left third toe amputation on 10/20/2016. Left foot wound. History of diabetes. EXAM: LEFT FOOT - COMPLETE 3+ VIEW COMPARISON:  10/17/2016 FINDINGS: Interval metatarsal amputation of the third toe. AP view shows focal erosion involving the lateral aspect of the second metatarsal head which appears new. This is concerning for potential osteomyelitis. No fracture identified. Stable extensive vascular calcifications. Stable retained metallic foreign bodies. IMPRESSION: Erosion of the lateral aspect of the left second metatarsal head is concerning for focal osteomyelitis. Electronically Signed   By: Irish Lack M.D.   On: 11/17/2016 19:39      Scheduled Meds: . Chlorhexidine Gluconate Cloth  6 each Topical Q0600  . heparin  5,000 Units Subcutaneous Q8H  . insulin aspart  2-6 Units Subcutaneous TID WC  . levothyroxine  25 mcg Oral QAC breakfast  . mupirocin ointment  1 application Nasal BID  . pantoprazole  40 mg Oral Daily   Continuous Infusions: . sodium chloride Stopped (11/18/16 1100)  . dextrose 5 % and 0.45% NaCl 1,000 mL infusion 150 mL/hr at 11/19/16 0938  . piperacillin-tazobactam (ZOSYN)  IV Stopped (11/19/16 1252)  . vancomycin Stopped (11/19/16 1816)     LOS: 2 days    Time spent in minutes: 35    Calvert Cantor, MD [facility] Pager: [email or web address] Password [email or web address] 11/19/2016, 6:20 PM

## 2016-11-19 NOTE — Progress Notes (Signed)
Subjective: Interval History: none.. More alert this morning  Objective: Vital signs in last 24 hours: Temp:  [97.5 F (36.4 C)-98.9 F (37.2 C)] 98.6 F (37 C) (08/08 0743) Pulse Rate:  [72-98] 87 (08/08 0600) Resp:  [10-32] 18 (08/08 0600) BP: (71-136)/(46-104) 113/55 (08/08 0600) SpO2:  [90 %-100 %] 100 % (08/08 0600) Weight:  [133 lb 2.5 oz (60.4 kg)] 133 lb 2.5 oz (60.4 kg) (08/08 0424)  Intake/Output from previous day: 08/07 0701 - 08/08 0700 In: 3580 [I.V.:3330; IV Piggyback:250] Out: 2050 [Urine:2050] Intake/Output this shift: No intake/output data recorded.  No change in foot. Dry gangrene on the great toe tip and also on the lateral aspect of the second toe. No erythema or purulence. No tracking into the arch of the foot  Lab Results:  Recent Labs  11/18/16 0601 11/19/16 0217  WBC 17.3* 16.7*  HGB 12.6* 10.7*  HCT 38.4* 32.8*  PLT 298 258   BMET  Recent Labs  11/18/16 1957 11/19/16 0217  NA 139 141  K 4.4 3.5  CL 98* 95*  CO2 26 30  GLUCOSE 85 88  BUN 153* 138*  CREATININE 4.61* 3.84*  CALCIUM 8.4* 8.2*    Studies/Results: US Renal  Result Date: 11/19/2016 CLINICAL DATA:  Acute onset of renal insufficiency. Initial encounter. EXAM: RENAL / URINARY TRACT ULTRASOUND COMPLETE COMPARISON:  CT of the abdomen and pelvis performed 12/27/2015 FINDINGS: Right Kidney: Length: 11.6 cm. Echogenicity within normal limits. No mass or hydronephrosis visualized. Left Kidney: Length: 9.1 cm. Echogenicity within normal limits. No mass or hydronephrosis visualized. Bladder: Decompressed, with a Foley catheter in place. A stone is noted within the gallbladder. IMPRESSION: 1. Unremarkable renal ultrasound. 2. Cholelithiasis.  Gallbladder otherwise grossly unremarkable. Electronically Signed   By: Roanna Raider M.D.   On: 11/19/2016 03:33   Dg Chest Port 1 View  Result Date: 11/17/2016 CLINICAL DATA:  Hypotension. EXAM: PORTABLE CHEST 1 VIEW COMPARISON:  Radiograph of Sep 02, 2016. FINDINGS: The heart size and mediastinal contours are within normal limits. Status post cardiac valve repair. No pneumothorax or pleural effusion is noted. Left lung is clear. Nodular density is seen in right lung base. No consolidative process is noted. The visualized skeletal structures are unremarkable. IMPRESSION: Nodular density seen in right lung base which may represent overlying nipple shadow repeat radiograph with nipple markers is recommended to rule out pulmonary nodule. No other acute abnormality is noted. Electronically Signed   By: Lupita Raider, M.D.   On: 11/17/2016 19:36   Dg Foot Complete Left  Result Date: 11/17/2016 CLINICAL DATA:  History of gangrene of left foot and toes and status post recent left third toe amputation on 10/20/2016. Left foot wound. History of diabetes. EXAM: LEFT FOOT - COMPLETE 3+ VIEW COMPARISON:  10/17/2016 FINDINGS: Interval metatarsal amputation of the third toe. AP view shows focal erosion involving the lateral aspect of the second metatarsal head which appears new. This is concerning for potential osteomyelitis. No fracture identified. Stable extensive vascular calcifications. Stable retained metallic foreign bodies. IMPRESSION: Erosion of the lateral aspect of the left second metatarsal head is concerning for focal osteomyelitis. Electronically Signed   By: Irish Lack M.D.   On: 11/17/2016 19:39   Anti-infectives: Anti-infectives    Start     Dose/Rate Route Frequency Ordered Stop   11/19/16 1800  vancomycin (VANCOCIN) IVPB 750 mg/150 ml premix     750 mg 150 mL/hr over 60 Minutes Intravenous Every 48 hours 11/18/16 0846  11/18/16 0400  piperacillin-tazobactam (ZOSYN) IVPB 2.25 g     2.25 g 100 mL/hr over 30 Minutes Intravenous Every 8 hours 11/17/16 2037     11/17/16 1900  piperacillin-tazobactam (ZOSYN) IVPB 3.375 g     3.375 g 100 mL/hr over 30 Minutes Intravenous  Once 11/17/16 1852 11/17/16 1946   11/17/16 1900  vancomycin  (VANCOCIN) IVPB 1000 mg/200 mL premix     1,000 mg 200 mL/hr over 60 Minutes Intravenous  Once 11/17/16 1852 11/17/16 2017      Assessment/Plan: s/p * No surgery found * More stable medically. Discussed need for either transmetatarsal or below-knee amputation. We'll schedule this for Friday. Patient will continue to consider option. Has had very difficult time healing toe amputations despite popliteal pulse and may have difficulty healing transmetatarsal amputation. Would have better mobility to be able to transfer on his heel with a transmetatarsal amputation would have the higher chance of healing below-knee amputation.   LOS: 2 days   Gretta Beganarly, [place] 11/19/2016, 8:47 AM

## 2016-11-20 ENCOUNTER — Ambulatory Visit: Payer: Medicare Other | Admitting: Cardiology

## 2016-11-20 ENCOUNTER — Encounter (HOSPITAL_COMMUNITY): Payer: Self-pay | Admitting: General Practice

## 2016-11-20 LAB — GLUCOSE, CAPILLARY
GLUCOSE-CAPILLARY: 191 mg/dL — AB (ref 65–99)
GLUCOSE-CAPILLARY: 188 mg/dL — AB (ref 65–99)
Glucose-Capillary: 128 mg/dL — ABNORMAL HIGH (ref 65–99)
Glucose-Capillary: 177 mg/dL — ABNORMAL HIGH (ref 65–99)
Glucose-Capillary: 179 mg/dL — ABNORMAL HIGH (ref 65–99)
Glucose-Capillary: 203 mg/dL — ABNORMAL HIGH (ref 65–99)

## 2016-11-20 LAB — RENAL FUNCTION PANEL
ANION GAP: 9 (ref 5–15)
Albumin: 2 g/dL — ABNORMAL LOW (ref 3.5–5.0)
BUN: 78 mg/dL — AB (ref 6–20)
CALCIUM: 8.6 mg/dL — AB (ref 8.9–10.3)
CO2: 37 mmol/L — ABNORMAL HIGH (ref 22–32)
Chloride: 95 mmol/L — ABNORMAL LOW (ref 101–111)
Creatinine, Ser: 1.85 mg/dL — ABNORMAL HIGH (ref 0.61–1.24)
GFR calc Af Amer: 41 mL/min — ABNORMAL LOW (ref >60)
GFR calc non Af Amer: 36 mL/min — ABNORMAL LOW (ref >60)
Glucose, Bld: 196 mg/dL — ABNORMAL HIGH (ref 65–99)
PHOSPHORUS: 2 mg/dL — AB (ref 2.5–4.6)
POTASSIUM: 3.1 mmol/L — AB (ref 3.5–5.1)
Sodium: 141 mmol/L (ref 135–145)

## 2016-11-20 LAB — CBC
HEMATOCRIT: 33.9 % — AB (ref 39.0–52.0)
HEMOGLOBIN: 10.9 g/dL — AB (ref 13.0–17.0)
MCH: 28.2 pg (ref 26.0–34.0)
MCHC: 32.2 g/dL (ref 30.0–36.0)
MCV: 87.6 fL (ref 78.0–100.0)
Platelets: 199 10*3/uL (ref 150–400)
RBC: 3.87 MIL/uL — ABNORMAL LOW (ref 4.22–5.81)
RDW: 19.4 % — AB (ref 11.5–15.5)
WBC: 17 10*3/uL — AB (ref 4.0–10.5)

## 2016-11-20 LAB — MAGNESIUM: Magnesium: 1.8 mg/dL (ref 1.7–2.4)

## 2016-11-20 MED ORDER — OXYCODONE-ACETAMINOPHEN 5-325 MG PO TABS
2.0000 | ORAL_TABLET | Freq: Once | ORAL | Status: AC
  Administered 2016-11-20: 2 via ORAL
  Filled 2016-11-20: qty 2

## 2016-11-20 MED ORDER — POTASSIUM CHLORIDE CRYS ER 20 MEQ PO TBCR
40.0000 meq | EXTENDED_RELEASE_TABLET | Freq: Once | ORAL | Status: AC
  Administered 2016-11-20: 40 meq via ORAL
  Filled 2016-11-20: qty 2

## 2016-11-20 MED ORDER — ZOLPIDEM TARTRATE 5 MG PO TABS
5.0000 mg | ORAL_TABLET | Freq: Once | ORAL | Status: AC
  Administered 2016-11-21: 5 mg via ORAL
  Filled 2016-11-20 (×2): qty 1

## 2016-11-20 MED ORDER — ADULT MULTIVITAMIN W/MINERALS CH
1.0000 | ORAL_TABLET | Freq: Every day | ORAL | Status: DC
  Administered 2016-11-20 – 2016-11-23 (×4): 1 via ORAL
  Filled 2016-11-20 (×4): qty 1

## 2016-11-20 MED ORDER — DEXTROSE-NACL 5-0.45 % IV SOLN
INTRAVENOUS | Status: DC
  Administered 2016-11-20 – 2016-11-21 (×2): via INTRAVENOUS

## 2016-11-20 MED ORDER — LEVOTHYROXINE SODIUM 25 MCG PO TABS: 25.0000 ug | ORAL_TABLET | Freq: Every day | ORAL | Status: DC

## 2016-11-20 MED ORDER — ASPIRIN EC 81 MG PO TBEC
81.0000 mg | DELAYED_RELEASE_TABLET | Freq: Every day | ORAL | Status: DC
  Administered 2016-11-20 – 2016-11-23 (×4): 81 mg via ORAL
  Filled 2016-11-20 (×5): qty 1

## 2016-11-20 MED ORDER — SENNA 8.6 MG PO TABS: 1.0000 | ORAL_TABLET | Freq: Every day | ORAL | Status: DC | PRN

## 2016-11-20 MED ORDER — K PHOS MONO-SOD PHOS DI & MONO 155-852-130 MG PO TABS
500.0000 mg | ORAL_TABLET | Freq: Two times a day (BID) | ORAL | Status: DC
  Administered 2016-11-20 – 2016-11-23 (×5): 500 mg via ORAL
  Filled 2016-11-20 (×8): qty 2

## 2016-11-20 MED ORDER — COLCHICINE 0.6 MG PO TABS
0.6000 mg | ORAL_TABLET | Freq: Every day | ORAL | Status: DC
  Administered 2016-11-20 – 2016-11-23 (×4): 0.6 mg via ORAL
  Filled 2016-11-20 (×4): qty 1

## 2016-11-20 MED ORDER — PRO-STAT SUGAR FREE PO LIQD
30.0000 mL | Freq: Two times a day (BID) | ORAL | Status: DC
  Administered 2016-11-20 – 2016-11-22 (×4): 30 mL via ORAL
  Filled 2016-11-20 (×6): qty 30

## 2016-11-20 NOTE — Progress Notes (Signed)
PROGRESS NOTE    Ronald Simpson   ZOX:[id number]  DOB: 1945/09/03  DOA: 11/17/2016 PCP: Pecola Lawless, MD   Brief Narrative:  Ronald Simpson is coming from SNF with a PMH of CAD, Anemia, Chronic combined systolic/diastolic HF, Hep C with Cirrhosis, CKD stage 3, DM, PVD s/p amputation of left side third/fourth/fifith toe and AKA of right extremity.  Presents to ED on 8/6  Via EMS for weakness. Upon arrival was noted to be hypotensive with systolic 60-70s. Potassium >7.5, Creat 8.48, LA 1.43. Noted to have dry gangrene of left 2 toes.  Recent Admission 7/6-7/12  for sepsis secondary to nonhealing left foot wound, AKI and hyperkalemia. He underwent amputation on 7/9 of left third toe discharged on 2 weeks of doxycycline and Augmentin to SNF.     Subjective: No complaints today  ROS: no complaints of nausea, vomiting, constipation diarrhea, cough, dyspnea or dysuria. No other complaints.   Assessment & Plan:   Principal Problem:   AKI (acute kidney injury)/ hypotension/ hyperkalemia -  second admission for the same in setting of Aldactone, Entresto, Dig, Demedex -Cr  8.48 >> 1.85 - K+ > 7.5 >> 3.1 - appreciate Nephro f/u - will need to cut back on diuretics that he is on for CHF especially as his EF has improved- see below - no sepsis-Procalcitonin 0.72, cultures negative x 2 days- d/c antibiotics 8/8 - cont IVF today  Active Problems: Chronic combined systolic/diastolic HF - EF previously 35-40 % on ECHO in 4/18 however repeat ECHO on 8/7 show improvement in EF to 65-70% - on Coreg, Aldactone, Entresto, Dig, Demedex all of which are on hold at this time  Dry Gangrene left 2 toes/ PAD Leukocytosis due to necrosis - 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,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 - vascular surgery has evaluated him and plans on either a transmet vs BKA tomorrow  Diabetes mellitus with peripheral vascular disease  - Levemir on hold- cont SSI  Hypokalemia, hypophos - replace, check mg  H/o  CAD/CABG - aspirin 81 mg and statin     Hypothyroidism, acquired - Synthroid    Benign essential HTN - holding meds as mentioned above     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  PAF - not on anticoagulation due to GI bleed in the past   anorexia/ severe protein calorie malnutrition - Ensure and Prostat   DVT prophylaxis: Heparin Code Status: Full Family Communication: wife and daughter Disposition Plan:  Consultants:   Vascular surgery  PCCM Procedures:     Antimicrobials:  Anti-infectives    Start     Dose/Rate Route Frequency Ordered Stop   11/19/16 1800  vancomycin (VANCOCIN) IVPB 750 mg/150 ml premix  Status:  Discontinued     750 mg 150 mL/hr over 60 Minutes Intravenous Every 48 hours 11/18/16 0846 11/19/16 1832   11/18/16 0400  piperacillin-tazobactam (ZOSYN) IVPB 2.25 g  Status:  Discontinued     2.25 g 100 mL/hr over 30 Minutes Intravenous Every 8 hours 11/17/16 2037 11/19/16 1832   11/17/16 1900  piperacillin-tazobactam (ZOSYN) IVPB 3.375 g     3.375 g 100 mL/hr over 30 Minutes Intravenous  Once 11/17/16 1852 11/17/16 1946   11/17/16 1900  vancomycin (VANCOCIN) IVPB 1000 mg/200 mL premix     1,000 mg 200 mL/hr over 60 Minutes Intravenous  Once 11/17/16 1852 11/17/16 2017       Objective: Vitals:   11/19/16 2323 11/20/16 0000 11/20/16 0104 11/20/16 0723  BP:  (!) 110/92 Marland Kitchen)  141/78 (!) 143/77  Pulse:  81 73 77  Resp:  (!) 21 16 20   Temp: 98.5 F (36.9 C)  98.6 F (37 C) 98.8 F (37.1 C)  TempSrc: Oral  Oral Oral  SpO2:  98% 99% 100%  Weight:   62.5 kg (137 lb 12.6 oz)   Height:   5\' 9"  (1.753 m)     Intake/Output Summary (Last 24 hours) at 11/20/16 1005 Last data filed at 11/20/16 0214  Gross per 24 hour  Intake           1942.5 ml  Output             2126 ml  Net           -183.5 ml   Filed Weights   11/18/16 0425 11/19/16 0424 11/20/16 0104  Weight: 57.3 kg (126 lb 5.2 oz) 60.4 kg (133 lb 2.5 oz) 62.5 kg (137 lb 12.6 oz)     Examination:  General exam: Appears comfortable  HEENT: PERRLA, oral mucosa moist, no sclera icterus or thrush Respiratory system: Clear to auscultation. Respiratory effort normal. Cardiovascular system: S1 & S2 heard,  No murmurs  Gastrointestinal system: Abdomen soft, non-tender, nondistended. Normal bowel sound. No organomegaly Central nervous system: Alert and oriented. No focal neurological deficits. Extremities: No cyanosis, clubbing or edema- right bka Skin: No rashes or ulcers- gangrene left 2 1st and 2nd toe Psychiatry:  Mood & affect appropriate.      Data Reviewed: I have personally reviewed following labs and imaging studies  CBC:  Recent Labs Lab 11/17/16 1755 11/18/16 0601 11/19/16 0217 11/20/16 0536  WBC 19.8* 17.3* 16.7* 17.0*  NEUTROABS 17.4*  --   --   --   HGB 12.5* 12.6* 10.7* 10.9*  HCT 39.4 38.4* 32.8* 33.9*  MCV 87.2 85.1 84.1 87.6  PLT 332 298 258 199   Basic Metabolic Panel:  Recent Labs Lab 11/18/16 0012 11/18/16 0601 11/18/16 0900 11/18/16 1957 11/19/16 0217 11/20/16 0536  NA 135 137  --  139 141 141  K 7.0* 5.8* 5.1 4.4 3.5 3.1*  CL 103 100*  --  98* 95* 95*  CO2 14* 19*  --  26 30 37*  GLUCOSE 114* 126*  --  85 88 196*  BUN 183* 172*  --  153* 138* 78*  CREATININE 7.33* 6.16*  --  4.61* 3.84* 1.85*  CALCIUM 9.5 9.2  --  8.4* 8.2* 8.6*  MG  --  2.0  --   --  1.6*  --   PHOS  --  5.5*  --   --  3.6 2.0*   GFR: Estimated Creatinine Clearance: 33.3 mL/min (A) (by C-G formula based on SCr of 1.85 mg/dL (H)). Liver Function Tests:  Recent Labs Lab 11/17/16 1755 11/19/16 0217 11/20/16 0536  AST 17  --   --   ALT 10*  --   --   ALKPHOS 186*  --   --   BILITOT 1.3*  --   --   PROT 8.8*  --   --   ALBUMIN 2.6* 2.0* 2.0*   No results for input(s): LIPASE, AMYLASE in the last 168 hours.  Recent Labs Lab 11/18/16 0012  AMMONIA 42*   Coagulation Profile: No results for input(s): INR, PROTIME in the last 168  hours. Cardiac Enzymes: No results for input(s): CKTOTAL, CKMB, CKMBINDEX, TROPONINI in the last 168 hours. BNP (last 3 results) No results for input(s): PROBNP in the last 8760 hours. HbA1C: No results  for input(s): HGBA1C in the last 72 hours. CBG:  Recent Labs Lab 11/19/16 1540 11/19/16 2010 11/19/16 2324 11/20/16 0456 11/20/16 0801  GLUCAP 162* 101* 150* 179* 177*   Lipid Profile: No results for input(s): CHOL, HDL, LDLCALC, TRIG, CHOLHDL, LDLDIRECT in the last 72 hours. Thyroid Function Tests: No results for input(s): TSH, T4TOTAL, FREET4, T3FREE, THYROIDAB in the last 72 hours. Anemia Panel: No results for input(s): VITAMINB12, FOLATE, FERRITIN, TIBC, IRON, RETICCTPCT in the last 72 hours. Urine analysis:    Component Value Date/Time   COLORURINE YELLOW 11/18/2016 0012   APPEARANCEUR TURBID (A) 11/18/2016 0012   LABSPEC 1.013 11/18/2016 0012   PHURINE 5.0 11/18/2016 0012   GLUCOSEU NEGATIVE 11/18/2016 0012   HGBUR SMALL (A) 11/18/2016 0012   BILIRUBINUR NEGATIVE 11/18/2016 0012   KETONESUR 5 (A) 11/18/2016 0012   PROTEINUR 100 (A) 11/18/2016 0012   NITRITE NEGATIVE 11/18/2016 0012   LEUKOCYTESUR MODERATE (A) 11/18/2016 0012   Sepsis Labs: @LABRCNTIP (procalcitonin:4,lacticidven:4) ) Recent Results (from the past 240 hour(s))  Culture, blood (Routine x 2)     Status: None (Preliminary result)   Collection Time: 11/17/16  5:55 PM  Result Value Ref Range Status   Specimen Description BLOOD LEFT HAND  Final   Special Requests   Final    BOTTLES DRAWN AEROBIC ONLY Blood Culture adequate volume   Culture NO GROWTH 2 DAYS  Final   Report Status PENDING  Incomplete  Culture, blood (Routine x 2)     Status: None (Preliminary result)   Collection Time: 11/17/16  6:48 PM  Result Value Ref Range Status   Specimen Description BLOOD RIGHT ANTECUBITAL  Final   Special Requests IN PEDIATRIC BOTTLE Blood Culture adequate volume  Final   Culture NO GROWTH 2 DAYS  Final    Report Status PENDING  Incomplete  MRSA PCR Screening     Status: Abnormal   Collection Time: 11/18/16 12:12 AM  Result Value Ref Range Status   MRSA by PCR POSITIVE (A) NEGATIVE Final    Comment:        The GeneXpert MRSA Assay (FDA approved for NASAL specimens only), is one component of a comprehensive MRSA colonization surveillance program. It is not intended to diagnose MRSA infection nor to guide or monitor treatment for MRSA infections. RESULT CALLED TO, READ BACK BY AND VERIFIED WITH: D TURNER 11/18/16 @ 0750 M VESTAL   Culture, Urine     Status: Abnormal   Collection Time: 11/18/16 12:12 AM  Result Value Ref Range Status   Specimen Description URINE, RANDOM  Final   Special Requests Normal  Final   Culture >=100,000 COLONIES/mL YEAST (A)  Final   Report Status 11/19/2016 FINAL  Final         Radiology Studies: Ct Chest Wo Contrast  Result Date: 11/19/2016 CLINICAL DATA:  Shortness of breath, weakness, renal failure and gangrene of left foot. EXAM: CT CHEST WITHOUT CONTRAST TECHNIQUE: Multidetector CT imaging of the chest was performed following the standard protocol without IV contrast. COMPARISON:  Chest x-ray on 11/17/2016. Prior chest CT on 08/27/2016 FINDINGS: Cardiovascular: The heart size is normal. Evidence of prior CABG is well as mitral valve repair. No pericardial fluid. The thoracic aorta is normal in caliber. Mediastinum/Nodes: Scattered small nonenlarged mediastinal lymph nodes. The largest measures 10 mm in short axis anterior to the carina. No obvious hilar lymphadenopathy. Lungs/Pleura: Within both posterior lower lobes, there is evidence of mild bronchiectasis with prominent bronchial wall thickening and scarring bilaterally. This appears  likely to be mostly chronic. A component of active bronchitis or with patchy pneumonia would be difficult to exclude. There is some associated pleural thickening without pleural fluid. No pulmonary mass lesions or  pneumothorax. Upper Abdomen: The liver demonstrates evidence of cirrhosis. There is a trace amount of ascites around the liver. Partially calcified gallstones visualized. Musculoskeletal: No chest wall mass or suspicious bone lesions identified. IMPRESSION: 1. Mild bronchiectasis with prominent bronchial wall thickening and pulmonary scarring in both lower lobes. This appears to most likely be chronic. A component of active bronchitis/pneumonia would be difficult to exclude. 2. Evidence of cirrhosis with trace ascites around the liver. Electronically Signed   By: Irish Lack M.D.   On: 11/19/2016 23:28   US Renal  Result Date: 11/19/2016 CLINICAL DATA:  Acute onset of renal insufficiency. Initial encounter. EXAM: RENAL / URINARY TRACT ULTRASOUND COMPLETE COMPARISON:  CT of the abdomen and pelvis performed 12/27/2015 FINDINGS: Right Kidney: Length: 11.6 cm. Echogenicity within normal limits. No mass or hydronephrosis visualized. Left Kidney: Length: 9.1 cm. Echogenicity within normal limits. No mass or hydronephrosis visualized. Bladder: Decompressed, with a Foley catheter in place. A stone is noted within the gallbladder. IMPRESSION: 1. Unremarkable renal ultrasound. 2. Cholelithiasis.  Gallbladder otherwise grossly unremarkable. Electronically Signed   By: Roanna Raider M.D.   On: 11/19/2016 03:33      Scheduled Meds: . Chlorhexidine Gluconate Cloth  6 each Topical Q0600  . feeding supplement (ENSURE ENLIVE)  237 mL Oral TID BM  . heparin  5,000 Units Subcutaneous Q8H  . insulin aspart  2-6 Units Subcutaneous TID WC  . levothyroxine  25 mcg Oral QAC breakfast  . mupirocin ointment  1 application Nasal BID  . pantoprazole  40 mg Oral Daily   Continuous Infusions: . sodium chloride Stopped (11/18/16 1100)  . dextrose 5 % and 0.45% NaCl 1,000 mL infusion 75 mL/hr at 11/19/16 1914     LOS: 3 days    Time spent in minutes: 35    Calvert Cantor, MD [facility] Pager: [email or web address] Password [email or web address] 11/20/2016, 10:05 AM

## 2016-11-20 NOTE — Progress Notes (Addendum)
  Progress Note  SUBJECTIVE:    Having pain with left foot.  OBJECTIVE:   Vitals:   11/20/16 0104 11/20/16 0723  BP: (!) 141/78 (!) 143/77  Pulse: 73 77  Resp: 16 20  Temp: 98.6 F (37 C) 98.8 F (37.1 C)  SpO2: 99% 100%    Intake/Output Summary (Last 24 hours) at 11/20/16 0926 Last data filed at 11/20/16 0214  Gross per 24 hour  Intake           2047.5 ml  Output             2226 ml  Net           -178.5 ml   Left third toe amputation site open without active drainage.   ASSESSMENT/PLAN:   71 y.o. male with non healing left 3rd toe amputation site.   Plan for left transmetatarsal versus left below knee amputation tomorrow. Patient agreeable to proceed. Obtain consent. NPO past midnight.   Raymond GurneyKimberly A Trinh 11/20/2016 9:26 AM -- LABS:   CBC    Component Value Date/Time   WBC 17.0 (H) 11/20/2016 0536   HGB 10.9 (L) 11/20/2016 0536   HCT 33.9 (L) 11/20/2016 0536   PLT 199 11/20/2016 0536    BMET    Component Value Date/Time   NA 141 11/20/2016 0536   NA 132 (A) 09/30/2016   K 3.1 (L) 11/20/2016 0536   CL 95 (L) 11/20/2016 0536   CO2 37 (H) 11/20/2016 0536   GLUCOSE 196 (H) 11/20/2016 0536   BUN 78 (H) 11/20/2016 0536   BUN 52 (A) 09/30/2016   CREATININE 1.85 (H) 11/20/2016 0536   CALCIUM 8.6 (L) 11/20/2016 0536   GFRNONAA 36 (L) 11/20/2016 0536   GFRAA 41 (L) 11/20/2016 0536    COAG Lab Results  Component Value Date   INR 1.04 10/17/2016   INR 1.57 08/27/2016   INR 1.26 08/02/2016   PROTIME 48.3 (A) 07/18/2016   PROTIME 62.4 (A) 07/15/2016   PROTIME 31.5 (A) 07/10/2016   No results found for: PTT  ANTIBIOTICS:   Anti-infectives    Start     Dose/Rate Route Frequency Ordered Stop   11/19/16 1800  vancomycin (VANCOCIN) IVPB 750 mg/150 ml premix  Status:  Discontinued     750 mg 150 mL/hr over 60 Minutes Intravenous Every 48 hours 11/18/16 0846 11/19/16 1832   11/18/16 0400  piperacillin-tazobactam (ZOSYN) IVPB 2.25 g  Status:  Discontinued      2.25 g 100 mL/hr over 30 Minutes Intravenous Every 8 hours 11/17/16 2037 11/19/16 1832   11/17/16 1900  piperacillin-tazobactam (ZOSYN) IVPB 3.375 g     3.375 g 100 mL/hr over 30 Minutes Intravenous  Once 11/17/16 1852 11/17/16 1946   11/17/16 1900  vancomycin (VANCOCIN) IVPB 1000 mg/200 mL premix     1,000 mg 200 mL/hr over 60 Minutes Intravenous  Once 11/17/16 1852 11/17/16 2017       Maris BergerKimberly Trinh, PA-C [facility] Office: [phone] Pager: [phone] 11/20/2016 9:26 AM   I have examined the patient, reviewed and agree with above.  Gretta BeganEarly, Todd, MD 11/20/2016 10:29 AM

## 2016-11-20 NOTE — Progress Notes (Signed)
Initial Nutrition Assessment  DOCUMENTATION CODES:   Severe malnutrition in context of chronic illness  INTERVENTION:   -Continue Ensure Enlive po TID, each supplement provides 350 kcal and 20 grams of protein -Continue 30 ml Prostat BID, each supplement provides 100 kcals and 15 grams protein -MVI daily  NUTRITION DIAGNOSIS:   Malnutrition (Severe) related to chronic illness (DM with peripheral neuropathy) as evidenced by percent weight loss, energy intake < 75% for > or equal to 1 month, moderate depletion of body fat, severe depletion of body fat, moderate depletions of muscle mass, severe depletion of muscle mass.  GOAL:   Patient will meet greater than or equal to 90% of their needs  MONITOR:   PO intake, Supplement acceptance, Labs, Weight trends, Skin, I & O's  REASON FOR ASSESSMENT:   Consult Assessment of nutrition requirement/status  ASSESSMENT:   71 year old male with recent amputation of left third toe secondary to gangrene. Discharged with oral antibiotics. Now presents septic from ?osteomyelitis.   Pt selective about which RD questions he would answer. He would often given short, close ended answers and would not elaborate despite probing. Pt is fixated on receiving grape juice- RD educated pt on carbohydrate content in juices and role of blood sugar control for wound healing, however, pt was not satisfied with answer. Pt later requested a diet sprite, which this RD provided and became more talkative during interview.   Pt reports poor appetite at baseline. He shares [facility]. Noted 10-60% meal completion during hospitalization- pt reports he ate a few bites of eggs this AM. Pt is familiar to this RD and is aware poor oral intake is a chronic problem for this pt.   [facility]- pt was on a 2 grams NA diet with 2 L fluid restriction. Medications include furosemide, ferrous sulfate, senna, Ensure TID, insulin detemir  8 units q HS and probiotic. Per MAR, 300 ml of fluid restriction reserved for supplements.   Reviewed wt hx; noted a 63# (32%) wt loss over the past 6 months, which is significant for time frame.   Nutrition-Focused physical exam completed. Findings are moderate to severe fat depletion, moderate to severe muscle depletion, and no edema.   Pt with chronic lt foot wound. Per discussion with RN, plan for transmetatarsal amputation vs BKA tomorrow (11/21/16).   Discussed with pt importance of glycemic control, good meal intake, and supplement intake to promote healing, especially for wound healing and upcoming surgery.  Current orders for glycemic control as insulin aspart 2-6 units TID with meals.  Pt with poor oral intake and would benefit from nutrient dense supplement. One Ensure Enlive supplement provides 350 kcals, 20 grams protein, and 44-45 grams of carbohydrate vs one Glucerna shake supplement, which provides 220 kcals, 10 grams of protein, and 26 grams of carbohydrate. Given pt's hx of DM, RD will continue to monitor PO intake, CBGS, and adjust supplement regimen as appropriate.   Labs reviewed: K: 3.1 (on IV supplementation), CBGS: 128-191, Phos: 2.0 (on PO supplementation).   Diet Order:  Diet Carb Modified Fluid consistency: Thin; Room service appropriate? Yes Diet NPO time specified Except for: Sips with Meds  Skin:  Wound (see comment) (open lt toe wound)  Last BM:  11/18/16  Height:   Ht Readings from Last 1 Encounters:  11/20/16 5\' 9"  (1.753 m)    Weight:   Wt Readings from Last 1 Encounters:  11/20/16 137 lb 12.6 oz (62.5  kg)    Ideal Body Weight:  66.9 kg  BMI:  Body mass index is 20.35 kg/m.  Estimated Nutritional Needs:   Kcal:  2000-2200  Protein:  120-135 grams  Fluid:  > 2.0 L  EDUCATION NEEDS:   Education needs addressed  Loren Vicens A. Mayford Knife, RD, LDN, CDE Pager: [phone] After hours Pager: [phone]

## 2016-11-21 ENCOUNTER — Encounter (HOSPITAL_COMMUNITY)
Admission: EM | Disposition: A | Discharge status: Skilled Nursing Facility | Payer: Self-pay | Source: Home / Self Care | Attending: Internal Medicine

## 2016-11-21 ENCOUNTER — Inpatient Hospital Stay (HOSPITAL_COMMUNITY): Payer: Medicare Other | Admitting: Certified Registered"

## 2016-11-21 ENCOUNTER — Encounter (HOSPITAL_COMMUNITY): Payer: Self-pay | Admitting: Surgery

## 2016-11-21 DIAGNOSIS — I96 Gangrene, not elsewhere classified: Secondary | ICD-10-CM

## 2016-11-21 DIAGNOSIS — I739 Peripheral vascular disease, unspecified: Secondary | ICD-10-CM

## 2016-11-21 HISTORY — PX: TRANSMETATARSAL AMPUTATION: SHX6197

## 2016-11-21 LAB — BASIC METABOLIC PANEL
Anion gap: 7 (ref 5–15)
BUN: 42 mg/dL — AB (ref 6–20)
CALCIUM: 8.2 mg/dL — AB (ref 8.9–10.3)
CHLORIDE: 98 mmol/L — AB (ref 101–111)
CO2: 34 mmol/L — AB (ref 22–32)
CREATININE: 1.01 mg/dL (ref 0.61–1.24)
GFR calc non Af Amer: >60 mL/min (ref >60)
GLUCOSE: 214 mg/dL — AB (ref 65–99)
Potassium: 3.1 mmol/L — ABNORMAL LOW (ref 3.5–5.1)
Sodium: 139 mmol/L (ref 135–145)

## 2016-11-21 LAB — MAGNESIUM: MAGNESIUM: 1.6 mg/dL — AB (ref 1.7–2.4)

## 2016-11-21 LAB — CBC
HCT: 29.7 % — ABNORMAL LOW (ref 39.0–52.0)
Hemoglobin: 9.2 g/dL — ABNORMAL LOW (ref 13.0–17.0)
MCH: 27.4 pg (ref 26.0–34.0)
MCHC: 31 g/dL (ref 30.0–36.0)
MCV: 88.4 fL (ref 78.0–100.0)
PLATELETS: 186 10*3/uL (ref 150–400)
RBC: 3.36 MIL/uL — AB (ref 4.22–5.81)
RDW: 18.3 % — AB (ref 11.5–15.5)
WBC: 14.6 10*3/uL — ABNORMAL HIGH (ref 4.0–10.5)

## 2016-11-21 LAB — GLUCOSE, CAPILLARY
GLUCOSE-CAPILLARY: 174 mg/dL — AB (ref 65–99)
GLUCOSE-CAPILLARY: 146 mg/dL — AB (ref 65–99)
Glucose-Capillary: 132 mg/dL — ABNORMAL HIGH (ref 65–99)
Glucose-Capillary: 129 mg/dL — ABNORMAL HIGH (ref 65–99)
Glucose-Capillary: 128 mg/dL — ABNORMAL HIGH (ref 65–99)
Glucose-Capillary: 135 mg/dL — ABNORMAL HIGH (ref 65–99)
Glucose-Capillary: 144 mg/dL — ABNORMAL HIGH (ref 65–99)

## 2016-11-21 LAB — PHOSPHORUS: Phosphorus: 1.2 mg/dL — ABNORMAL LOW (ref 2.5–4.6)

## 2016-11-21 SURGERY — AMPUTATION, FOOT, TRANSMETATARSAL
Anesthesia: General | Site: Foot | Laterality: Left

## 2016-11-21 MED ORDER — FENTANYL CITRATE (PF) 250 MCG/5ML IJ SOLN
INTRAMUSCULAR | Status: AC
  Filled 2016-11-21: qty 5

## 2016-11-21 MED ORDER — MAGNESIUM SULFATE 2 GM/50ML IV SOLN
2.0000 g | Freq: Once | INTRAVENOUS | Status: AC
  Administered 2016-11-21: 2 g via INTRAVENOUS
  Filled 2016-11-21: qty 50

## 2016-11-21 MED ORDER — LACTATED RINGERS IV SOLN
INTRAVENOUS | Status: DC
  Administered 2016-11-21: 15:00:00 via INTRAVENOUS

## 2016-11-21 MED ORDER — PROPOFOL 10 MG/ML IV BOLUS
INTRAVENOUS | Status: DC | PRN
  Administered 2016-11-21: 150 mg via INTRAVENOUS

## 2016-11-21 MED ORDER — ONDANSETRON HCL 4 MG/2ML IJ SOLN: 4.0000 mg | Freq: Once | INTRAMUSCULAR | Status: DC | PRN

## 2016-11-21 MED ORDER — MIDAZOLAM HCL 2 MG/2ML IJ SOLN
INTRAMUSCULAR | Status: AC
  Filled 2016-11-21: qty 2

## 2016-11-21 MED ORDER — MEPERIDINE HCL 25 MG/ML IJ SOLN: 6.2500 mg | INTRAMUSCULAR | Status: DC | PRN

## 2016-11-21 MED ORDER — HYDROMORPHONE HCL 1 MG/ML IJ SOLN
INTRAMUSCULAR | Status: AC
  Filled 2016-11-21: qty 1

## 2016-11-21 MED ORDER — CEFAZOLIN SODIUM 1 G IJ SOLR
INTRAMUSCULAR | Status: AC
  Filled 2016-11-21: qty 20

## 2016-11-21 MED ORDER — MAGNESIUM SULFATE 2 GM/50ML IV SOLN
2.0000 g | Freq: Every day | INTRAVENOUS | Status: DC | PRN
  Filled 2016-11-21: qty 50

## 2016-11-21 MED ORDER — FENTANYL CITRATE (PF) 100 MCG/2ML IJ SOLN
INTRAMUSCULAR | Status: DC | PRN
  Administered 2016-11-21: 25 ug via INTRAVENOUS
  Administered 2016-11-21: 50 ug via INTRAVENOUS
  Administered 2016-11-21: 25 ug via INTRAVENOUS

## 2016-11-21 MED ORDER — CEFAZOLIN SODIUM-DEXTROSE 2-3 GM-% IV SOLR
INTRAVENOUS | Status: DC | PRN
  Administered 2016-11-21: 2 g via INTRAVENOUS

## 2016-11-21 MED ORDER — FENTANYL CITRATE (PF) 100 MCG/2ML IJ SOLN
INTRAMUSCULAR | Status: AC
  Filled 2016-11-21: qty 2

## 2016-11-21 MED ORDER — LIDOCAINE 2% (20 MG/ML) 5 ML SYRINGE
INTRAMUSCULAR | Status: DC | PRN
  Administered 2016-11-21: 100 mg via INTRAVENOUS

## 2016-11-21 MED ORDER — PHENOL 1.4 % MT LIQD: 1.0000 | OROMUCOSAL | Status: DC | PRN

## 2016-11-21 MED ORDER — ONDANSETRON HCL 4 MG/2ML IJ SOLN: 4.0000 mg | Freq: Four times a day (QID) | INTRAMUSCULAR | Status: DC | PRN

## 2016-11-21 MED ORDER — MORPHINE SULFATE (PF) 2 MG/ML IV SOLN
1.0000 mg | INTRAVENOUS | Status: DC | PRN
  Administered 2016-11-22 – 2016-11-23 (×5): 1 mg via INTRAVENOUS
  Filled 2016-11-21 (×5): qty 1

## 2016-11-21 MED ORDER — ONDANSETRON HCL 4 MG/2ML IJ SOLN
INTRAMUSCULAR | Status: DC | PRN
  Administered 2016-11-21: 4 mg via INTRAVENOUS

## 2016-11-21 MED ORDER — DOCUSATE SODIUM 100 MG PO CAPS
100.0000 mg | ORAL_CAPSULE | Freq: Every day | ORAL | Status: DC
  Administered 2016-11-22 – 2016-11-23 (×2): 100 mg via ORAL
  Filled 2016-11-21 (×2): qty 1

## 2016-11-21 MED ORDER — DEXTROSE 5 % IV SOLN
1.5000 g | Freq: Two times a day (BID) | INTRAVENOUS | Status: AC
  Administered 2016-11-21 – 2016-11-22 (×2): 1.5 g via INTRAVENOUS
  Filled 2016-11-21 (×2): qty 1.5

## 2016-11-21 MED ORDER — HYDROCODONE-ACETAMINOPHEN 5-325 MG PO TABS
1.0000 | ORAL_TABLET | Freq: Four times a day (QID) | ORAL | Status: DC | PRN
  Administered 2016-11-21 – 2016-11-22 (×2): 1 via ORAL
  Filled 2016-11-21 (×2): qty 1

## 2016-11-21 MED ORDER — LIDOCAINE 2% (20 MG/ML) 5 ML SYRINGE
INTRAMUSCULAR | Status: AC
  Filled 2016-11-21: qty 10

## 2016-11-21 MED ORDER — MIDAZOLAM HCL 5 MG/5ML IJ SOLN
INTRAMUSCULAR | Status: DC | PRN
  Administered 2016-11-21: 1 mg via INTRAVENOUS

## 2016-11-21 MED ORDER — HYDROMORPHONE HCL 1 MG/ML IJ SOLN
0.2500 mg | INTRAMUSCULAR | Status: DC | PRN
  Administered 2016-11-21 (×4): 0.5 mg via INTRAVENOUS

## 2016-11-21 MED ORDER — POTASSIUM CHLORIDE CRYS ER 20 MEQ PO TBCR: 20.0000 meq | EXTENDED_RELEASE_TABLET | Freq: Every day | ORAL | Status: DC | PRN

## 2016-11-21 MED ORDER — 0.9 % SODIUM CHLORIDE (POUR BTL) OPTIME
TOPICAL | Status: DC | PRN
  Administered 2016-11-21: 1000 mL

## 2016-11-21 MED ORDER — PROPOFOL 10 MG/ML IV BOLUS
INTRAVENOUS | Status: AC
  Filled 2016-11-21: qty 20

## 2016-11-21 MED ORDER — HEPARIN SODIUM (PORCINE) 5000 UNIT/ML IJ SOLN
5000.0000 [IU] | Freq: Three times a day (TID) | INTRAMUSCULAR | Status: DC
  Administered 2016-11-22 (×3): 5000 [IU] via SUBCUTANEOUS
  Filled 2016-11-21 (×4): qty 1

## 2016-11-21 MED ORDER — POTASSIUM CHLORIDE CRYS ER 20 MEQ PO TBCR
40.0000 meq | EXTENDED_RELEASE_TABLET | ORAL | Status: AC
  Administered 2016-11-21: 40 meq via ORAL
  Filled 2016-11-21: qty 2

## 2016-11-21 SURGICAL SUPPLY — 46 items
BANDAGE ACE 6X5 VEL STRL LF (GAUZE/BANDAGES/DRESSINGS) ×3 IMPLANT
BLADE SAW GIGLI 510 (BLADE) ×2 IMPLANT
BLADE SAW GIGLI 510MM (BLADE) ×1
BNDG CONFORM 2 STRL LF (GAUZE/BANDAGES/DRESSINGS) ×3 IMPLANT
BNDG GAUZE ELAST 4 BULKY (GAUZE/BANDAGES/DRESSINGS) ×3 IMPLANT
CANISTER SUCT 3000ML PPV (MISCELLANEOUS) ×3 IMPLANT
CLIP LIGATING EXTRA MED SLVR (CLIP) ×3 IMPLANT
CLIP LIGATING EXTRA SM BLUE (MISCELLANEOUS) ×3 IMPLANT
COVER SURGICAL LIGHT HANDLE (MISCELLANEOUS) ×3 IMPLANT
DRAPE EXTREMITY T 121X128X90 (DRAPE) ×3 IMPLANT
DRAPE HALF SHEET 40X57 (DRAPES) ×3 IMPLANT
ELECT REM PT RETURN 9FT ADLT (ELECTROSURGICAL) ×3
ELECTRODE REM PT RTRN 9FT ADLT (ELECTROSURGICAL) ×1 IMPLANT
GAUZE SPONGE 4X4 12PLY STRL (GAUZE/BANDAGES/DRESSINGS) ×3 IMPLANT
GAUZE SPONGE 4X4 12PLY STRL LF (GAUZE/BANDAGES/DRESSINGS) ×3 IMPLANT
GAUZE XEROFORM 5X9 LF (GAUZE/BANDAGES/DRESSINGS) ×3 IMPLANT
GLOVE BIO SURGEON STRL SZ 6.5 (GLOVE) ×4 IMPLANT
GLOVE BIO SURGEON STRL SZ7 (GLOVE) ×3 IMPLANT
GLOVE BIO SURGEONS STRL SZ 6.5 (GLOVE) ×2
GLOVE BIOGEL PI IND STRL 7.0 (GLOVE) ×1 IMPLANT
GLOVE BIOGEL PI INDICATOR 7.0 (GLOVE) ×2
GLOVE ECLIPSE 7.5 STRL STRAW (GLOVE) ×3 IMPLANT
GLOVE SS BIOGEL STRL SZ 7.5 (GLOVE) ×1 IMPLANT
GLOVE SUPERSENSE BIOGEL SZ 7.5 (GLOVE) ×2
GOWN STRL REUS W/ TWL LRG LVL3 (GOWN DISPOSABLE) ×3 IMPLANT
GOWN STRL REUS W/ TWL XL LVL3 (GOWN DISPOSABLE) ×1 IMPLANT
GOWN STRL REUS W/TWL LRG LVL3 (GOWN DISPOSABLE) ×6
GOWN STRL REUS W/TWL XL LVL3 (GOWN DISPOSABLE) ×2
KIT BASIN OR (CUSTOM PROCEDURE TRAY) ×3 IMPLANT
KIT ROOM TURNOVER OR (KITS) ×3 IMPLANT
NEEDLE 22X1 1/2 (OR ONLY) (NEEDLE) IMPLANT
NS IRRIG 1000ML POUR BTL (IV SOLUTION) ×3 IMPLANT
PACK GENERAL/GYN (CUSTOM PROCEDURE TRAY) ×3 IMPLANT
PAD ARMBOARD 7.5X6 YLW CONV (MISCELLANEOUS) ×6 IMPLANT
SPECIMEN JAR SMALL (MISCELLANEOUS) ×3 IMPLANT
STAPLER VISISTAT 35W (STAPLE) ×3 IMPLANT
SUT ETHILON 3 0 PS 1 (SUTURE) ×3 IMPLANT
SUT VIC AB 2-0 CT1 18 (SUTURE) ×6 IMPLANT
SUT VIC AB 3-0 SH 27 (SUTURE) ×2
SUT VIC AB 3-0 SH 27X BRD (SUTURE) ×1 IMPLANT
SWAB CULTURE ESWAB REG 1ML (MISCELLANEOUS) IMPLANT
SYR CONTROL 10ML LL (SYRINGE) IMPLANT
TOWEL GREEN STERILE (TOWEL DISPOSABLE) ×3 IMPLANT
TOWEL GREEN STERILE FF (TOWEL DISPOSABLE) ×3 IMPLANT
UNDERPAD 30X30 (UNDERPADS AND DIAPERS) ×3 IMPLANT
WATER STERILE IRR 1000ML POUR (IV SOLUTION) ×3 IMPLANT

## 2016-11-21 NOTE — Anesthesia Preprocedure Evaluation (Addendum)
Anesthesia Evaluation  Patient identified by MRN, date of birth, ID band Patient awake    Reviewed: Allergy & Precautions, NPO status , Patient's Chart, lab work & pertinent test results  History of Anesthesia Complications (+) Emergence Delirium  Airway Mallampati: II  TM Distance: >3 FB Neck ROM: Full    Dental  (+) Edentulous Upper, Edentulous Lower   Pulmonary pneumonia, former smoker,    breath sounds clear to auscultation       Cardiovascular hypertension, + CAD, + Peripheral Vascular Disease, +CHF and + DOE  + dysrhythmias  Rhythm:Regular Rate:Normal     Neuro/Psych  Neuromuscular disease    GI/Hepatic (+) Cirrhosis       , Hepatitis -  Endo/Other  diabetes, Poorly ControlledHypothyroidism   Renal/GU Renal disease     Musculoskeletal   Abdominal   Peds  Hematology  (+) anemia ,   Anesthesia Other Findings   Reproductive/Obstetrics                            Anesthesia Physical  Anesthesia Plan  ASA: IV  Anesthesia Plan: General   Post-op Pain Management:    Induction: Intravenous  PONV Risk Score and Plan: 2 and Ondansetron, Propofol and Treatment may vary due to age or medical condition  Airway Management Planned: LMA  Additional Equipment:   Intra-op Plan:   Post-operative Plan: Extubation in OR  Informed Consent: I have reviewed the patients History and Physical, chart, labs and discussed the procedure including the risks, benefits and alternatives for the proposed anesthesia with the patient or authorized representative who has indicated his/her understanding and acceptance.   Dental advisory given  Plan Discussed with: CRNA  Anesthesia Plan Comments:       Anesthesia Quick Evaluation

## 2016-11-21 NOTE — Op Note (Signed)
    OPERATIVE REPORT  DATE OF SURGERY: 11/21/2016  PATIENT: Ronald Simpson, 71 y.o. male MRN: [id number]  DOB: January 08, 1946  PRE-OPERATIVE DIAGNOSIS: Left foot ischemia with gangrene at the metatarsal head on the second toe and dry gangrene of the great toe  POST-OPERATIVE DIAGNOSIS:  Same  PROCEDURE: Left below knee amputation  SURGEON:  Gretta Beganodd Karry Causer, M.D.  PHYSICIAN ASSISTANT: Samantha Rhyne PA-C  ANESTHESIA:  Adderall  EBL: 20 ml  Total I/O In: 1396.3 [I.V.:1396.3] Out: 121 [Urine:100; Stool:1; Blood:20]  BLOOD ADMINISTERED: None  DRAINS: None  SPECIMEN: Below-knee amputation  COUNTS CORRECT:  YES  PLAN OF CARE: PACU   PATIENT DISPOSITION:  PACU - hemodynamically stable  PROCEDURE DETAILS: Patient is a very complex history. Presented with gangrenous changes of his fourth and fifth toe over 6 months ago. Had amputation with very prolonged healing and eventual healing. He recently presented with gangrene of his third toe and underwent open Ray amputation by myself approximately one month ago. He continues to have difficulty healing this and now presents with gangrenous changes of the second metatarsal head and also now a new finding of dry gangrene of his great toe. This is despite a palpable left popliteal pulse. Had extensive discussion with patient. I explained that he has had the very difficult time healing lesions despite none patency of the peroneal artery runoff and palpable popliteal pulse. With his gangrenous changes of the second metatarsal head explained that he could potentially heal a transmetatarsal amputation but this would be quite unlikely giving his past history of similar difficulty healing the same level. Also offered a below-knee amputation which are much better chance of healing. He wished to proceed with below-knee amputation. Understands that there would be more difficulty transferring by  Patient was taken operative placed supine position where the area  of the left leg prepped in usual sterile fashion. Incision was made over the tibia and the posterior muscl  and skin flap was base. Incision was taken down to the tibia and anterior tibial muscle bodies were divided. Electrocautery. The anterior tibial neurovascular bundle was ligated with 2-0 silk ties and divided. The ostium was elevated off the fibula and the fibula was divided with shears. The L periosteum was elevated off the tibia and the tibia was divided with a Gigli saw. Soleus and gastrocnemius muscle were left in line with the posterior-based skin flap. Specialist passed off the field. Hemostasis tablet cautery. The wound was closed with 0 Vicryl figure-of-eight sutures to close the anterior fascia the posterior fascia. Care was taken to smooth the edge of the tibia with his cachectic condition was concern regarding this area of bony prominence. Muscle flap was brought up over this area as well. The skin was closed with skin staples. The patient was transferred to the recovery room stable condition  Larina Earthlyodd F. Gorje Iyer, M.D., [facility] 11/21/2016 4:33 PM

## 2016-11-21 NOTE — Progress Notes (Signed)
Patient took meds this morning with sip for water. However when swallowing he decided he didn't want to take meds anymore and spit some out into trash can. I think some meds went down but I cant see which meds he spit out.

## 2016-11-21 NOTE — Anesthesia Postprocedure Evaluation (Signed)
Anesthesia Post Note  Patient: Ronald Simpson  Procedure(s) Performed: Procedure(s) (LRB): LEFT BELOW KNEE AMPUTATION (Left)     Patient location during evaluation: PACU Anesthesia Type: General Level of consciousness: awake and alert Pain management: pain level controlled Vital Signs Assessment: post-procedure vital signs reviewed and stable Respiratory status: spontaneous breathing, nonlabored ventilation, respiratory function stable and patient connected to nasal cannula oxygen Cardiovascular status: blood pressure returned to baseline and stable Postop Assessment: no signs of nausea or vomiting Anesthetic complications: no    Last Vitals:  Vitals:   11/21/16 1650 11/21/16 1721  BP: (!) 145/85 (!) 143/78  Pulse: 89 89  Resp: 17 18  Temp: (!) 36.3 C 36.7 C  SpO2: 100% 96%    Last Pain:  Vitals:   11/21/16 1721  TempSrc: Oral  PainSc:                  Kinza Gouveia DAVID

## 2016-11-21 NOTE — Progress Notes (Signed)
PROGRESS NOTE    Ronald Simpson   ZOX:[id number]  DOB: Apr 19, 1945  DOA: 11/17/2016 PCP: Pecola Lawless, MD   Brief Narrative:  Ronald Simpson is coming from SNF with a PMH of CAD, Anemia, Chronic combined systolic/diastolic HF, Hep C with Cirrhosis, CKD stage 3, DM, PVD s/p amputation of left side third/fourth/fifith toe and AKA of right extremity.  Presents to ED on 8/6  Via EMS for weakness. Upon arrival was noted to be hypotensive with systolic 60-70s. Potassium >7.5, Creat 8.48, LA 1.43. Noted to have dry gangrene of left 2 toes.  Recent Admission 7/6-7/12  for sepsis secondary to nonhealing left foot wound, AKI and hyperkalemia. He underwent amputation on 7/9 of left third toe discharged on 2 weeks of doxycycline and Augmentin to SNF.     Subjective: No complaints today - awaiting surgery ROS: no complaints of nausea, vomiting, constipation diarrhea, cough, dyspnea or dysuria. No other complaints.   Assessment & Plan:   Principal Problem:   AKI (acute kidney injury)/ hypotension/ hyperkalemia -  second admission for the same in setting of Aldactone, Entresto, Dig, Demedex -Cr  8.48 >> 1.85 >> 1.01 - K+ > 7.5 >> 3.1 - appreciate Nephro f/u - will need to cut back on diuretics that he is on for CHF especially as his EF has improved- see below - no sepsis-Procalcitonin 0.72, cultures negative x 2 days- d/c'd antibiotics 8/8    Active Problems: Chronic combined systolic/diastolic HF - EF previously 35-40 % on ECHO in 4/18 however repeat ECHO on 8/7 show improvement in EF to 65-70% - on Coreg, Aldactone, Entresto, Dig, Demedex all of which are on hold at this time  Dry Gangrene left 2 toes/ PAD Leukocytosis due to necrosis - 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,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 - vascular surgery has evaluated him - BKA performed today  Diabetes mellitus with peripheral vascular disease  - Levemir on hold- cont SSI  Hypokalemia, hypophos - replace, check mg  H/o CAD/CABG -  aspirin 81 mg and statin     Hypothyroidism, acquired - Synthroid    Benign essential HTN - holding meds as mentioned above     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  PAF - not on anticoagulation due to GI bleed in the past   anorexia/ severe protein calorie malnutrition - Ensure and Prostat   DVT prophylaxis: Heparin Code Status: Full Family Communication: wife and daughter Disposition Plan:  Consultants:   Vascular surgery  PCCM Procedures:     Antimicrobials:  Anti-infectives    Start     Dose/Rate Route Frequency Ordered Stop   11/19/16 1800  vancomycin (VANCOCIN) IVPB 750 mg/150 ml premix  Status:  Discontinued     750 mg 150 mL/hr over 60 Minutes Intravenous Every 48 hours 11/18/16 0846 11/19/16 1832   11/18/16 0400  piperacillin-tazobactam (ZOSYN) IVPB 2.25 g  Status:  Discontinued     2.25 g 100 mL/hr over 30 Minutes Intravenous Every 8 hours 11/17/16 2037 11/19/16 1832   11/17/16 1900  piperacillin-tazobactam (ZOSYN) IVPB 3.375 g     3.375 g 100 mL/hr over 30 Minutes Intravenous  Once 11/17/16 1852 11/17/16 1946   11/17/16 1900  vancomycin (VANCOCIN) IVPB 1000 mg/200 mL premix     1,000 mg 200 mL/hr over 60 Minutes Intravenous  Once 11/17/16 1852 11/17/16 2017       Objective: Vitals:   11/21/16 1622 11/21/16 1635 11/21/16 1645 11/21/16 1650  BP: (!) 151/80 (!) 153/77 (!) 141/76 Marland Kitchen)  145/85  Pulse:  91  89  Resp: 12 14  17   Temp: 98.6 F (37 C)  (!) 97.4 F (36.3 C) (!) 97.4 F (36.3 C)  TempSrc:      SpO2: 98% 100%  100%  Weight:      Height:        Intake/Output Summary (Last 24 hours) at 11/21/16 1719 Last data filed at 11/21/16 1650  Gross per 24 hour  Intake             1860 ml  Output             1271 ml  Net              589 ml   Filed Weights   11/19/16 0424 11/20/16 0104 11/21/16 0457  Weight: 60.4 kg (133 lb 2.5 oz) 62.5 kg (137 lb 12.6 oz) 63.1 kg (139 lb 1.8 oz)    Examination:  General exam: Appears comfortable    HEENT: PERRLA, oral mucosa moist, no sclera icterus or thrush Respiratory system: Clear to auscultation. Respiratory effort normal. Cardiovascular system: S1 & S2 heard,  No murmurs  Gastrointestinal system: Abdomen soft, non-tender, nondistended. Normal bowel sound. No organomegaly Central nervous system: Alert and oriented. No focal neurological deficits. Extremities: No cyanosis, clubbing or edema- right bka Skin: No rashes or ulcers- gangrene left 2 1st and 2nd toe Psychiatry:  Mood & affect appropriate.      Data Reviewed: I have personally reviewed following labs and imaging studies  CBC:  Recent Labs Lab 11/17/16 1755 11/18/16 0601 11/19/16 0217 11/20/16 0536 11/21/16 0401  WBC 19.8* 17.3* 16.7* 17.0* 14.6*  NEUTROABS 17.4*  --   --   --   --   HGB 12.5* 12.6* 10.7* 10.9* 9.2*  HCT 39.4 38.4* 32.8* 33.9* 29.7*  MCV 87.2 85.1 84.1 87.6 88.4  PLT 332 298 258 199 186   Basic Metabolic Panel:  Recent Labs Lab 11/18/16 0601 11/18/16 0900 11/18/16 1957 11/19/16 0217 11/20/16 0536 11/21/16 0401  NA 137  --  139 141 141 139  K 5.8* 5.1 4.4 3.5 3.1* 3.1*  CL 100*  --  98* 95* 95* 98*  CO2 19*  --  26 30 37* 34*  GLUCOSE 126*  --  85 88 196* 214*  BUN 172*  --  153* 138* 78* 42*  CREATININE 6.16*  --  4.61* 3.84* 1.85* 1.01  CALCIUM 9.2  --  8.4* 8.2* 8.6* 8.2*  MG 2.0  --   --  1.6* 1.8 1.6*  PHOS 5.5*  --   --  3.6 2.0* 1.2*   GFR: Estimated Creatinine Clearance: 61.6 mL/min (by C-G formula based on SCr of 1.01 mg/dL). Liver Function Tests:  Recent Labs Lab 11/17/16 1755 11/19/16 0217 11/20/16 0536  AST 17  --   --   ALT 10*  --   --   ALKPHOS 186*  --   --   BILITOT 1.3*  --   --   PROT 8.8*  --   --   ALBUMIN 2.6* 2.0* 2.0*   No results for input(s): LIPASE, AMYLASE in the last 168 hours.  Recent Labs Lab 11/18/16 0012  AMMONIA 42*   Coagulation Profile: No results for input(s): INR, PROTIME in the last 168 hours. Cardiac Enzymes: No  results for input(s): CKTOTAL, CKMB, CKMBINDEX, TROPONINI in the last 168 hours. BNP (last 3 results) No results for input(s): PROBNP in the last 8760 hours. HbA1C: No results for  input(s): HGBA1C in the last 72 hours. CBG:  Recent Labs Lab 11/21/16 0455 11/21/16 0802 11/21/16 1209 11/21/16 1352 11/21/16 1624  GLUCAP 174* 132* 129* 128* 146*   Lipid Profile: No results for input(s): CHOL, HDL, LDLCALC, TRIG, CHOLHDL, LDLDIRECT in the last 72 hours. Thyroid Function Tests: No results for input(s): TSH, T4TOTAL, FREET4, T3FREE, THYROIDAB in the last 72 hours. Anemia Panel: No results for input(s): VITAMINB12, FOLATE, FERRITIN, TIBC, IRON, RETICCTPCT in the last 72 hours. Urine analysis:    Component Value Date/Time   COLORURINE YELLOW 11/18/2016 0012   APPEARANCEUR TURBID (A) 11/18/2016 0012   LABSPEC 1.013 11/18/2016 0012   PHURINE 5.0 11/18/2016 0012   GLUCOSEU NEGATIVE 11/18/2016 0012   HGBUR SMALL (A) 11/18/2016 0012   BILIRUBINUR NEGATIVE 11/18/2016 0012   KETONESUR 5 (A) 11/18/2016 0012   PROTEINUR 100 (A) 11/18/2016 0012   NITRITE NEGATIVE 11/18/2016 0012   LEUKOCYTESUR MODERATE (A) 11/18/2016 0012   Sepsis Labs: @LABRCNTIP (procalcitonin:4,lacticidven:4) ) Recent Results (from the past 240 hour(s))  Culture, blood (Routine x 2)     Status: None (Preliminary result)   Collection Time: 11/17/16  5:55 PM  Result Value Ref Range Status   Specimen Description BLOOD LEFT HAND  Final   Special Requests   Final    BOTTLES DRAWN AEROBIC ONLY Blood Culture adequate volume   Culture NO GROWTH 4 DAYS  Final   Report Status PENDING  Incomplete  Culture, blood (Routine x 2)     Status: None (Preliminary result)   Collection Time: 11/17/16  6:48 PM  Result Value Ref Range Status   Specimen Description BLOOD RIGHT ANTECUBITAL  Final   Special Requests IN PEDIATRIC BOTTLE Blood Culture adequate volume  Final   Culture NO GROWTH 4 DAYS  Final   Report Status PENDING   Incomplete  MRSA PCR Screening     Status: Abnormal   Collection Time: 11/18/16 12:12 AM  Result Value Ref Range Status   MRSA by PCR POSITIVE (A) NEGATIVE Final    Comment:        The GeneXpert MRSA Assay (FDA approved for NASAL specimens only), is one component of a comprehensive MRSA colonization surveillance program. It is not intended to diagnose MRSA infection nor to guide or monitor treatment for MRSA infections. RESULT CALLED TO, READ BACK BY AND VERIFIED WITH: D TURNER 11/18/16 @ 0750 M VESTAL   Culture, Urine     Status: Abnormal   Collection Time: 11/18/16 12:12 AM  Result Value Ref Range Status   Specimen Description URINE, RANDOM  Final   Special Requests Normal  Final   Culture >=100,000 COLONIES/mL YEAST (A)  Final   Report Status 11/19/2016 FINAL  Final         Radiology Studies: Ct Chest Wo Contrast  Result Date: 11/19/2016 CLINICAL DATA:  Shortness of breath, weakness, renal failure and gangrene of left foot. EXAM: CT CHEST WITHOUT CONTRAST TECHNIQUE: Multidetector CT imaging of the chest was performed following the standard protocol without IV contrast. COMPARISON:  Chest x-ray on 11/17/2016. Prior chest CT on 08/27/2016 FINDINGS: Cardiovascular: The heart size is normal. Evidence of prior CABG is well as mitral valve repair. No pericardial fluid. The thoracic aorta is normal in caliber. Mediastinum/Nodes: Scattered small nonenlarged mediastinal lymph nodes. The largest measures 10 mm in short axis anterior to the carina. No obvious hilar lymphadenopathy. Lungs/Pleura: Within both posterior lower lobes, there is evidence of mild bronchiectasis with prominent bronchial wall thickening and scarring bilaterally. This appears likely  to be mostly chronic. A component of active bronchitis or with patchy pneumonia would be difficult to exclude. There is some associated pleural thickening without pleural fluid. No pulmonary mass lesions or pneumothorax. Upper Abdomen: The  liver demonstrates evidence of cirrhosis. There is a trace amount of ascites around the liver. Partially calcified gallstones visualized. Musculoskeletal: No chest wall mass or suspicious bone lesions identified. IMPRESSION: 1. Mild bronchiectasis with prominent bronchial wall thickening and pulmonary scarring in both lower lobes. This appears to most likely be chronic. A component of active bronchitis/pneumonia would be difficult to exclude. 2. Evidence of cirrhosis with trace ascites around the liver. Electronically Signed   By: Irish LackGlenn  Yamagata M.D.   On: 11/19/2016 23:28      Scheduled Meds: . aspirin EC  81 mg Oral Daily  . Chlorhexidine Gluconate Cloth  6 each Topical Q0600  . colchicine  0.6 mg Oral Daily  . feeding supplement (ENSURE ENLIVE)  237 mL Oral TID BM  . feeding supplement (PRO-STAT SUGAR FREE 64)  30 mL Oral BID  . fentaNYL      . heparin  5,000 Units Subcutaneous Q8H  . HYDROmorphone      . HYDROmorphone      . insulin aspart  2-6 Units Subcutaneous TID WC  . levothyroxine  25 mcg Oral QAC breakfast  . multivitamin with minerals  1 tablet Oral Daily  . mupirocin ointment  1 application Nasal BID  . pantoprazole  40 mg Oral Daily  . phosphorus  500 mg Oral BID   Continuous Infusions: . sodium chloride Stopped (11/18/16 1100)  . dextrose 5 % and 0.45% NaCl 75 mL/hr at 11/21/16 1637  . lactated ringers       LOS: 4 days    Time spent in minutes: 35    Calvert CantorSaima Lalisa Kiehn, MD [facility] Pager: [email or web address] Password [email or web address] 11/21/2016, 5:19 PM

## 2016-11-21 NOTE — Transfer of Care (Signed)
Immediate Anesthesia Transfer of Care Note  Patient: Ronald Simpson  Procedure(s) Performed: Procedure(s): LEFT BELOW KNEE AMPUTATION (Left)  Patient Location: PACU  Anesthesia Type:General  Level of Consciousness: drowsy and patient cooperative  Airway & Oxygen Therapy: Patient Spontanous Breathing  Post-op Assessment: Report given to RN, Post -op Vital signs reviewed and stable and Patient moving all extremities X 4  Post vital signs: Reviewed and stable  Last Vitals:  Vitals:   11/21/16 0457 11/21/16 1622  BP: (!) 147/78 (!) (P) 151/80  Pulse: 73   Resp: 17 (P) 12  Temp: 37.1 C (P) 37 C  SpO2: 98%     Last Pain:  Vitals:   11/21/16 1622  TempSrc:   PainSc: (P) 4          Complications: No apparent anesthesia complications

## 2016-11-21 NOTE — NC FL2 (Signed)
[place] MEDICAID FL2 LEVEL OF CARE SCREENING TOOL     IDENTIFICATION  Patient Name: Ronald Simpson Birthdate: [birth year] Sex: male Admission Date (Current Location): 11/17/2016  [facility] and [place] Number:  Producer, television/film/video and Address:  [facility], [address] [place], [place] [address]      Provider Number: [id number]  Attending Physician Name and Address:  Calvert Cantor, MD  Relative Name and Phone Number:       Current Level of Care: Hospital Recommended Level of Care: Skilled Nursing Facility Prior Approval Number:    Date Approved/Denied:   PASRR Number: [id number]  Discharge Plan: SNF    Current Diagnoses: Patient Active Problem List   Diagnosis Date Noted  . Fungal dermatitis 10/27/2016  . Protein-calorie malnutrition, severe 10/19/2016  . Hyperkalemia 10/17/2016  . Delayed surgical wound healing of foot amputation stump (HCC)   . Hypotension   . Adult failure to thrive 09/16/2016  . Status post thoracentesis   . Cardiogenic shock (HCC)   . Respiratory failure with hypoxia (HCC) 08/27/2016  . Chronic systolic CHF (congestive heart failure) (HCC)   . Acute respiratory failure (HCC)   . Septic shock (HCC)   . Chronic bronchitis (HCC) 08/21/2016  . Positive ANA (antinuclear antibody) 08/21/2016  . Pericarditis 08/12/2016  . Acute on chronic combined systolic and diastolic CHF (congestive heart failure) (HCC) 08/05/2016  . Acute kidney injury superimposed on chronic kidney disease (HCC) 08/05/2016  . Chest pain 08/02/2016  . Pleuritic chest pain 08/02/2016  . Pleural effusion 08/02/2016  . Elevated brain natriuretic peptide (BNP) level 08/02/2016  . Chronic anemia 07/22/2016  . Supratherapeutic international normalized ratio (INR) 07/17/2016  . Ascites 06/27/2016  . Chronic anticoagulation   . Cirrhosis (HCC)   . Acute GI bleeding 05/15/2016  . Melanotic stools 05/15/2016  . Altered mental status 04/10/2016  .  Gangrene of toe of left foot (HCC)   . Cystitis   . 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 At risk for adverse drug event 02/14/2016  . Peripheral neuropathy 01/31/2016  . Scrotal edema 01/15/2016  . Dry gangrene (HCC) 01/15/2016  . Renal insufficiency 01/15/2016  . PAD (peripheral artery disease) (HCC) 01/11/2016  . 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 Tobacco abuse   . Tachycardia   . Hyponatremia   . Hypoalbuminemia   . MRSA cellulitis of left foot   . Serratia marcescens infection (HCC)   . Escherichia coli infection   . CAD in native artery   . PAF (paroxysmal atrial fibrillation) (HCC)   . Uncontrolled type 2 diabetes mellitus with complication (HCC)   . Hepatitis C without hepatic coma 12/27/2015  . Diabetes mellitus with peripheral vascular disease (HCC) 12/27/2015  . History of CVA (cerebrovascular accident) 12/27/2015  . AKI (acute kidney injury) (HCC) 12/27/2015  . Hypothyroidism, acquired 12/27/2015  . Benign essential HTN 12/27/2015    Orientation RESPIRATION BLADDER Height & Weight     Self, Situation, Place  Normal Continent, Indwelling catheter Weight: 63.1 kg (139 lb 1.8 oz) Height:  5\' 9"  (175.3 cm)  BEHAVIORAL SYMPTOMS/MOOD NEUROLOGICAL BOWEL NUTRITION STATUS      Incontinent Diet (Please see DC Summary)  AMBULATORY STATUS COMMUNICATION OF NEEDS Skin   Extensive Assist Verbally Surgical wounds (Closed incision on leg from bka)  Personal Care Assistance Level of Assistance  Bathing, Feeding, Dressing Bathing Assistance: Maximum assistance Feeding assistance: Independent Dressing Assistance: Limited assistance     Functional Limitations Info             SPECIAL CARE FACTORS FREQUENCY  PT (By licensed PT)     PT Frequency: Not yet assessed but will need 5x/week for BKA              Contractures      Additional Factors Info   Code Status, Allergies, Isolation Precautions, Insulin Sliding Scale Code Status Info: Full Allergies Info: Hydralazine, Percocet Oxycodone-acetaminophen, Tramadol, Remeron Mirtazapine   Insulin Sliding Scale Info: 3x daily with meals Isolation Precautions Info: MRSA     Current Medications (11/21/2016):  This is the current hospital active medication list Current Facility-Administered Medications  Medication Dose Route Frequency Provider Last Rate Last Dose  . 0.9 %  sodium chloride infusion  250 mL Intravenous PRN Tobey Grim, NP   Stopped at 11/18/16 1100  . acetaminophen (TYLENOL) tablet 650 mg  650 mg Oral Q6H PRN Calvert Cantor, MD   650 mg at 11/20/16 1532  . aspirin EC tablet 81 mg  81 mg Oral Daily Calvert Cantor, MD   81 mg at 11/21/16 0903  . Chlorhexidine Gluconate Cloth 2 % PADS 6 each  6 each Topical Q0600 Roslynn Amble, MD   6 each at 11/21/16 0600  . colchicine tablet 0.6 mg  0.6 mg Oral Daily Calvert Cantor, MD   0.6 mg at 11/21/16 0902  . dextrose 5 %-0.45 % sodium chloride infusion   Intravenous Continuous Calvert Cantor, MD 75 mL/hr at 11/20/16 2340    . feeding supplement (ENSURE ENLIVE) (ENSURE ENLIVE) liquid 237 mL  237 mL Oral TID BM Calvert Cantor, MD   Stopped at 11/21/16 1000  . feeding supplement (PRO-STAT SUGAR FREE 64) liquid 30 mL  30 mL Oral BID Calvert Cantor, MD   Stopped at 11/21/16 1000  . heparin injection 5,000 Units  5,000 Units Subcutaneous Q8H Tobey Grim, NP   5,000 Units at 11/20/16 2100  . insulin aspart (novoLOG) injection 2-6 Units  2-6 Units Subcutaneous TID WC Calvert Cantor, MD   2 Units at 11/20/16 1756  . levothyroxine (SYNTHROID, LEVOTHROID) tablet 25 mcg  25 mcg Oral QAC breakfast Roslynn Amble, MD   25 mcg at 11/21/16 0857  . magnesium sulfate IVPB 2 g 50 mL  2 g Intravenous Once Calvert Cantor, MD 50 mL/hr at 11/21/16 0857 2 g at 11/21/16 0857  . multivitamin with minerals tablet 1 tablet  1 tablet Oral Daily Calvert Cantor, MD   1 tablet at 11/21/16 0902  . mupirocin ointment (BACTROBAN) 2 % 1 application  1 application Nasal BID Roslynn Amble, MD   1 application at 11/20/16 2101  . pantoprazole (PROTONIX) EC tablet 40 mg  40 mg Oral Daily Roslynn Amble, MD   40 mg at 11/21/16 1610  . phosphorus (K PHOS NEUTRAL) tablet 500 mg  500 mg Oral BID Calvert Cantor, MD   500 mg at 11/20/16 2101  . potassium chloride SA (K-DUR,KLOR-CON) CR tablet 40 mEq  40 mEq Oral Q4H Calvert Cantor, MD   40 mEq at 11/21/16 0857  . senna (SENOKOT) tablet 8.6 mg  1 tablet Oral Daily PRN Calvert Cantor, MD         Discharge Medications: Please see discharge summary for a list of discharge medications.  Relevant Imaging Results:  Relevant Lab Results:   Additional Information SS#: [id number]  Mearl Latinadia S Chasity Outten, LCSWA

## 2016-11-21 NOTE — Anesthesia Procedure Notes (Signed)
Procedure Name: LMA Insertion Date/Time: 11/21/2016 3:19 PM Performed by: Burt EkURNER, Tacari Repass ASHLEY Pre-anesthesia Checklist: Patient identified, Emergency Drugs available, Suction available and Patient being monitored Patient Re-evaluated:Patient Re-evaluated prior to induction Oxygen Delivery Method: Circle system utilized Preoxygenation: Pre-oxygenation with 100% oxygen Induction Type: IV induction Ventilation: Mask ventilation without difficulty LMA: LMA inserted LMA Size: 4.0 Number of attempts: 1 Placement Confirmation: positive ETCO2 and breath sounds checked- equal and bilateral Tube secured with: Tape Dental Injury: Teeth and Oropharynx as per pre-operative assessment

## 2016-11-22 ENCOUNTER — Encounter (HOSPITAL_COMMUNITY): Payer: Self-pay | Admitting: Vascular Surgery

## 2016-11-22 DIAGNOSIS — E039 Hypothyroidism, unspecified: Secondary | ICD-10-CM

## 2016-11-22 LAB — GLUCOSE, CAPILLARY
GLUCOSE-CAPILLARY: 171 mg/dL — AB (ref 65–99)
GLUCOSE-CAPILLARY: 163 mg/dL — AB (ref 65–99)
Glucose-Capillary: 119 mg/dL — ABNORMAL HIGH (ref 65–99)
Glucose-Capillary: 176 mg/dL — ABNORMAL HIGH (ref 65–99)
Glucose-Capillary: 180 mg/dL — ABNORMAL HIGH (ref 65–99)
Glucose-Capillary: 194 mg/dL — ABNORMAL HIGH (ref 65–99)

## 2016-11-22 LAB — CULTURE, BLOOD (ROUTINE X 2)
Culture: NO GROWTH
Culture: NO GROWTH
SPECIAL REQUESTS: ADEQUATE
Special Requests: ADEQUATE

## 2016-11-22 MED ORDER — HYDROCODONE-ACETAMINOPHEN 5-325 MG PO TABS
2.0000 | ORAL_TABLET | Freq: Four times a day (QID) | ORAL | Status: DC
Start: 2016-11-22 — End: 2016-11-23
  Administered 2016-11-22 – 2016-11-23 (×5): 2 via ORAL
  Filled 2016-11-22 (×5): qty 2

## 2016-11-22 MED ORDER — ORAL CARE MOUTH RINSE: 15.0000 mL | Freq: Two times a day (BID) | OROMUCOSAL | Status: DC

## 2016-11-22 MED ORDER — HYDROCODONE-ACETAMINOPHEN 5-325 MG PO TABS: 1.0000 | ORAL_TABLET | Freq: Four times a day (QID) | ORAL | Status: DC | PRN

## 2016-11-22 MED ORDER — HYDROCODONE-ACETAMINOPHEN 5-325 MG PO TABS: 2.0000 | ORAL_TABLET | Freq: Four times a day (QID) | ORAL | Status: DC | PRN

## 2016-11-22 NOTE — Progress Notes (Signed)
  Progress Note    11/22/2016 12:04 PM 1 Day Post-Op  Subjective:  Complaining about need for iv pain meds  Vitals:   11/22/16 0429 11/22/16 0506  BP: 140/88   Pulse: (!) 109   Resp: 18   Temp: (!) 97.5 F (36.4 C) 99.3 F (37.4 C)  SpO2: 100%     Physical Exam: aaox3 Well healed right aka Left bka dressing cdi  CBC    Component Value Date/Time   WBC 14.6 (H) 11/21/2016 0401   RBC 3.36 (L) 11/21/2016 0401   HGB 9.2 (L) 11/21/2016 0401   HCT 29.7 (L) 11/21/2016 0401   PLT 186 11/21/2016 0401   MCV 88.4 11/21/2016 0401   MCH 27.4 11/21/2016 0401   MCHC 31.0 11/21/2016 0401   RDW 18.3 (H) 11/21/2016 0401   LYMPHSABS 1.3 11/17/2016 1755   MONOABS 1.1 (H) 11/17/2016 1755   EOSABS 0.0 11/17/2016 1755   BASOSABS 0.0 11/17/2016 1755    BMET    Component Value Date/Time   NA 139 11/21/2016 0401   NA 132 (A) 09/30/2016   K 3.1 (L) 11/21/2016 0401   CL 98 (L) 11/21/2016 0401   CO2 34 (H) 11/21/2016 0401   GLUCOSE 214 (H) 11/21/2016 0401   BUN 42 (H) 11/21/2016 0401   BUN 52 (A) 09/30/2016   CREATININE 1.01 11/21/2016 0401   CALCIUM 8.2 (L) 11/21/2016 0401   GFRNONAA >60 11/21/2016 0401   GFRAA >60 11/21/2016 0401    INR    Component Value Date/Time   INR 1.04 10/17/2016 1618     Intake/Output Summary (Last 24 hours) at 11/22/16 1204 Last data filed at 11/22/16 1043  Gross per 24 hour  Intake             2325 ml  Output             1070 ml  Net             1255 ml     Assessment:  71 y.o. male is s/p left bka Plan: Dressing down tomorrow.  Discussed pain meds with he and nurse. Has had issues with iv meds in the past.    Meera Vasco C. Randie Heinzain, MD [facility] [place] Office: [phone] Pager: [phone]  11/22/2016 12:04 PM

## 2016-11-22 NOTE — Progress Notes (Signed)
PT Cancellation Note  Patient Details Name: Ronald Simpson MRN: [id number] DOB: 03-06-1946   Cancelled Treatment:    Reason Eval/Treat Not Completed: Patient declined, no reason specified  "hurting to bad.  Not today." 11/22/2016  [place] BingKen [name], PT [phone]  (pager)   Eliseo GumKenneth V [name] 11/22/2016, 3:00 PM

## 2016-11-22 NOTE — Progress Notes (Signed)
Pt refused labs this morning. Initially he wanted to get pain med prior to blood collection. Pain med given, pt still refusing. Importance post surgical blood work explained to pt, as well as imp[ortance of fallow up on his potassium and magnesium level. Pt still refused at this time. MD notified, lab notified to attempt later.

## 2016-11-22 NOTE — Progress Notes (Signed)
PROGRESS NOTE    Ronald Simpson   ZOX:[id number]  DOB: [birth year]  DOA: 11/17/2016 PCP: Pecola Lawless, MD   Brief Narrative:  Ronald Simpson is coming from SNF with a PMH of CAD, Anemia, Chronic combined systolic/diastolic HF, Hep C with Cirrhosis, CKD stage 3, DM, PVD s/p amputation of left side third/fourth/fifith toe and AKA of right extremity.  Presents to ED on 8/6  Via EMS for weakness. Upon arrival was noted to be hypotensive with systolic 60-70s. Potassium >7.5, Creat 8.48, LA 1.43. Noted to have dry gangrene of left 2 toes.  Recent Admission 7/6-7/12  for sepsis secondary to nonhealing left foot wound, AKI and hyperkalemia. He underwent amputation on 7/9 of left third toe discharged on 2 weeks of doxycycline and Augmentin to SNF.     Subjective: In pain today and very unhappy about it. Does not want a blood draw for labs until leg pain better.  ROS: no complaints of nausea, vomiting, constipation diarrhea, cough, dyspnea or dysuria. No other complaints.   Assessment & Plan:   Principal Problem:   AKI (acute kidney injury)/ hypotension/ hyperkalemia -  second admission for the same in setting of Aldactone, Entresto, Dig, Demedex -Cr  8.48 >> 1.85 >> 1.01 - K+ > 7.5 >> 3.1 - awaiting labs today - appreciate Nephro f/u - will need to cut back on diuretics that he is on for CHF especially as his EF has improved- see below - no sepsis-Procalcitonin 0.72, cultures negative x 2 days- d/c'd antibiotics 8/8    Active Problems: Chronic combined systolic/diastolic HF - EF previously 35-40 % on ECHO in 4/18 however repeat ECHO on 8/7 show improvement in EF to 65-70% - on Coreg, Aldactone, Entresto, Dig, Demedex all of which are on hold at this time  Dry Gangrene left 2 toes/ PAD Leukocytosis due to necrosis - 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,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 - underwent left BKA   - pain control- Hydrocodone 2 tabs QID and IV Morphine or Dilaudid in between  Diabetes mellitus  with peripheral vascular disease  - Levemir on hold- cont SSI  Hypokalemia, hypophos - replace   H/o CAD/CABG - aspirin 81 mg and statin     Hypothyroidism, acquired - Synthroid    Benign essential HTN - holding meds as mentioned above     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  PAF - not on anticoagulation due to GI bleed in the past   anorexia/ severe protein calorie malnutrition - Ensure and Prostat   DVT prophylaxis: Heparin Code Status: Full Family Communication: wife and daughter [facility] Consultants:   Vascular surgery  PCCM Procedures:     Antimicrobials:  Anti-infectives    Start     Dose/Rate Route Frequency Ordered Stop   11/21/16 2200  cefUROXime (ZINACEF) 1.5 g in dextrose 5 % 50 mL IVPB     1.5 g 100 mL/hr over 30 Minutes Intravenous Every 12 hours 11/21/16 1944 11/22/16 1008   11/19/16 1800  vancomycin (VANCOCIN) IVPB 750 mg/150 ml premix  Status:  Discontinued     750 mg 150 mL/hr over 60 Minutes Intravenous Every 48 hours 11/18/16 0846 11/19/16 1832   11/18/16 0400  piperacillin-tazobactam (ZOSYN) IVPB 2.25 g  Status:  Discontinued     2.25 g 100 mL/hr over 30 Minutes Intravenous Every 8 hours 11/17/16 2037 11/19/16 1832   11/17/16 1900  piperacillin-tazobactam (ZOSYN) IVPB 3.375 g     3.375 g 100 mL/hr  over 30 Minutes Intravenous  Once 11/17/16 1852 11/17/16 1946   11/17/16 1900  vancomycin (VANCOCIN) IVPB 1000 mg/200 mL premix     1,000 mg 200 mL/hr over 60 Minutes Intravenous  Once 11/17/16 1852 11/17/16 2017       Objective: Vitals:   11/21/16 2209 11/21/16 2357 11/22/16 0429 11/22/16 0506  BP: (!) 148/82 (!) 161/92 140/88   Pulse:  (!) 106 (!) 109   Resp:  18 18   Temp:  98.8 F (37.1 C) (!) 97.5 F (36.4 C) 99.3 F (37.4 C)  TempSrc:  Oral Oral Oral  SpO2:  100% 100%   Weight:   67.9 kg (149 lb 11.1 oz)   Height:        Intake/Output Summary (Last 24 hours) at 11/22/16 1214 Last data filed  at 11/22/16 1043  Gross per 24 hour  Intake             2325 ml  Output             1070 ml  Net             1255 ml   Filed Weights   11/20/16 0104 11/21/16 0457 11/22/16 0429  Weight: 62.5 kg (137 lb 12.6 oz) 63.1 kg (139 lb 1.8 oz) 67.9 kg (149 lb 11.1 oz)    Examination: General exam: Appears comfortable  HEENT: PERRLA, oral mucosa moist, no sclera icterus or thrush Respiratory system: Clear to auscultation. Respiratory effort normal. Cardiovascular system: S1 & S2 heard,  No murmurs  Gastrointestinal system: Abdomen soft, non-tender, nondistended. Normal bowel sound. No organomegaly Central nervous system: Alert and oriented. No focal neurological deficits. Extremities: No cyanosis, clubbing- right and left BKA Skin: No rashes or ulcers Psychiatry:  Mood & affect appropriate.   Data Reviewed: I have personally reviewed following labs and imaging studies  CBC:  Recent Labs Lab 11/17/16 1755 11/18/16 0601 11/19/16 0217 11/20/16 0536 11/21/16 0401  WBC 19.8* 17.3* 16.7* 17.0* 14.6*  NEUTROABS 17.4*  --   --   --   --   HGB 12.5* 12.6* 10.7* 10.9* 9.2*  HCT 39.4 38.4* 32.8* 33.9* 29.7*  MCV 87.2 85.1 84.1 87.6 88.4  PLT 332 298 258 199 186   Basic Metabolic Panel:  Recent Labs Lab 11/18/16 0601 11/18/16 0900 11/18/16 1957 11/19/16 0217 11/20/16 0536 11/21/16 0401  NA 137  --  139 141 141 139  K 5.8* 5.1 4.4 3.5 3.1* 3.1*  CL 100*  --  98* 95* 95* 98*  CO2 19*  --  26 30 37* 34*  GLUCOSE 126*  --  85 88 196* 214*  BUN 172*  --  153* 138* 78* 42*  CREATININE 6.16*  --  4.61* 3.84* 1.85* 1.01  CALCIUM 9.2  --  8.4* 8.2* 8.6* 8.2*  MG 2.0  --   --  1.6* 1.8 1.6*  PHOS 5.5*  --   --  3.6 2.0* 1.2*   GFR: Estimated Creatinine Clearance: 66.3 mL/min (by C-G formula based on SCr of 1.01 mg/dL). Liver Function Tests:  Recent Labs Lab 11/17/16 1755 11/19/16 0217 11/20/16 0536  AST 17  --   --   ALT 10*  --   --   ALKPHOS 186*  --   --   BILITOT 1.3*  --    --   PROT 8.8*  --   --   ALBUMIN 2.6* 2.0* 2.0*   No results for input(s): LIPASE, AMYLASE in the last 168  hours.  Recent Labs Lab 11/18/16 0012  AMMONIA 42*   Coagulation Profile: No results for input(s): INR, PROTIME in the last 168 hours. Cardiac Enzymes: No results for input(s): CKTOTAL, CKMB, CKMBINDEX, TROPONINI in the last 168 hours. BNP (last 3 results) No results for input(s): PROBNP in the last 8760 hours. HbA1C: No results for input(s): HGBA1C in the last 72 hours. CBG:  Recent Labs Lab 11/21/16 1719 11/21/16 2016 11/21/16 2356 11/22/16 0428 11/22/16 0808  GLUCAP 135* 144* 194* 171* 163*   Lipid Profile: No results for input(s): CHOL, HDL, LDLCALC, TRIG, CHOLHDL, LDLDIRECT in the last 72 hours. Thyroid Function Tests: No results for input(s): TSH, T4TOTAL, FREET4, T3FREE, THYROIDAB in the last 72 hours. Anemia Panel: No results for input(s): VITAMINB12, FOLATE, FERRITIN, TIBC, IRON, RETICCTPCT in the last 72 hours. Urine analysis:    Component Value Date/Time   COLORURINE YELLOW 11/18/2016 0012   APPEARANCEUR TURBID (A) 11/18/2016 0012   LABSPEC 1.013 11/18/2016 0012   PHURINE 5.0 11/18/2016 0012   GLUCOSEU NEGATIVE 11/18/2016 0012   HGBUR SMALL (A) 11/18/2016 0012   BILIRUBINUR NEGATIVE 11/18/2016 0012   KETONESUR 5 (A) 11/18/2016 0012   PROTEINUR 100 (A) 11/18/2016 0012   NITRITE NEGATIVE 11/18/2016 0012   LEUKOCYTESUR MODERATE (A) 11/18/2016 0012   Sepsis Labs: @LABRCNTIP (procalcitonin:4,lacticidven:4) ) Recent Results (from the past 240 hour(s))  Culture, blood (Routine x 2)     Status: None   Collection Time: 11/17/16  5:55 PM  Result Value Ref Range Status   Specimen Description BLOOD LEFT HAND  Final   Special Requests   Final    BOTTLES DRAWN AEROBIC ONLY Blood Culture adequate volume   Culture NO GROWTH 5 DAYS  Final   Report Status 11/22/2016 FINAL  Final  Culture, blood (Routine x 2)     Status: None   Collection Time: 11/17/16   6:48 PM  Result Value Ref Range Status   Specimen Description BLOOD RIGHT ANTECUBITAL  Final   Special Requests IN PEDIATRIC BOTTLE Blood Culture adequate volume  Final   Culture NO GROWTH 5 DAYS  Final   Report Status 11/22/2016 FINAL  Final  MRSA PCR Screening     Status: Abnormal   Collection Time: 11/18/16 12:12 AM  Result Value Ref Range Status   MRSA by PCR POSITIVE (A) NEGATIVE Final    Comment:        The GeneXpert MRSA Assay (FDA approved for NASAL specimens only), is one component of a comprehensive MRSA colonization surveillance program. It is not intended to diagnose MRSA infection nor to guide or monitor treatment for MRSA infections. RESULT CALLED TO, READ BACK BY AND VERIFIED WITH: D TURNER 11/18/16 @ 0750 M VESTAL   Culture, Urine     Status: Abnormal   Collection Time: 11/18/16 12:12 AM  Result Value Ref Range Status   Specimen Description URINE, RANDOM  Final   Special Requests Normal  Final   Culture >=100,000 COLONIES/mL YEAST (A)  Final   Report Status 11/19/2016 FINAL  Final         Radiology Studies: No results found.    Scheduled Meds: . aspirin EC  81 mg Oral Daily  . colchicine  0.6 mg Oral Daily  . docusate sodium  100 mg Oral Daily  . feeding supplement (ENSURE ENLIVE)  237 mL Oral TID BM  . feeding supplement (PRO-STAT SUGAR FREE 64)  30 mL Oral BID  . heparin  5,000 Units Subcutaneous Q8H  . HYDROcodone-acetaminophen  2  tablet Oral Q6H  . insulin aspart  2-6 Units Subcutaneous TID WC  . levothyroxine  25 mcg Oral QAC breakfast  . mouth rinse  15 mL Mouth Rinse BID  . multivitamin with minerals  1 tablet Oral Daily  . mupirocin ointment  1 application Nasal BID  . pantoprazole  40 mg Oral Daily  . phosphorus  500 mg Oral BID   Continuous Infusions: . sodium chloride Stopped (11/18/16 1100)  . dextrose 5 % and 0.45% NaCl 75 mL/hr at 11/21/16 1637  . magnesium sulfate 1 - 4 g bolus IVPB       LOS: 5 days    Time spent in  minutes: 35    Calvert Cantor, MD [facility] Pager: [email or web address] Password [facility] 11/22/2016, 12:14 PM

## 2016-11-22 NOTE — Progress Notes (Signed)
OT Cancellation Note  Patient Details Name: Ronald Simpson MRN: [id number] DOB: February 03, 1946   Cancelled Treatment:    Reason Eval/Treat Not Completed: Patient declined, no reason specified. Pt declined to participate in therapy session. Attempted to see in conjunction with PT in order to maximize functional gains. However, pt adamantly declined all mobility and participation in ADL at this time secondary to pain. OT will check back as able to initiate evaluation.   Doristine Sectionharity A Eh Sesay, MS OTR/L  Pager: [phone]   Cecilio Ohlrich A Lenny Bouchillon 11/22/2016, 3:00 PM

## 2016-11-23 DIAGNOSIS — I5042 Chronic combined systolic (congestive) and diastolic (congestive) heart failure: Secondary | ICD-10-CM

## 2016-11-23 DIAGNOSIS — E1151 Type 2 diabetes mellitus with diabetic peripheral angiopathy without gangrene: Secondary | ICD-10-CM

## 2016-11-23 DIAGNOSIS — Z89611 Acquired absence of right leg above knee: Secondary | ICD-10-CM

## 2016-11-23 DIAGNOSIS — Z89512 Acquired absence of left leg below knee: Secondary | ICD-10-CM

## 2016-11-23 DIAGNOSIS — I10 Essential (primary) hypertension: Secondary | ICD-10-CM

## 2016-11-23 DIAGNOSIS — G8918 Other acute postprocedural pain: Secondary | ICD-10-CM

## 2016-11-23 DIAGNOSIS — Z89519 Acquired absence of unspecified leg below knee: Secondary | ICD-10-CM

## 2016-11-23 DIAGNOSIS — D62 Acute posthemorrhagic anemia: Secondary | ICD-10-CM

## 2016-11-23 DIAGNOSIS — I480 Paroxysmal atrial fibrillation: Secondary | ICD-10-CM

## 2016-11-23 DIAGNOSIS — N179 Acute kidney failure, unspecified: Secondary | ICD-10-CM

## 2016-11-23 DIAGNOSIS — D638 Anemia in other chronic diseases classified elsewhere: Secondary | ICD-10-CM

## 2016-11-23 DIAGNOSIS — Z8673 Personal history of transient ischemic attack (TIA), and cerebral infarction without residual deficits: Secondary | ICD-10-CM

## 2016-11-23 LAB — GLUCOSE, CAPILLARY
GLUCOSE-CAPILLARY: 118 mg/dL — AB (ref 65–99)
GLUCOSE-CAPILLARY: 136 mg/dL — AB (ref 65–99)
Glucose-Capillary: 134 mg/dL — ABNORMAL HIGH (ref 65–99)

## 2016-11-23 MED ORDER — PRO-STAT SUGAR FREE PO LIQD: 30.0000 mL | Freq: Two times a day (BID) | ORAL | 0 refills | Status: AC

## 2016-11-23 MED ORDER — MUPIROCIN 2 % EX OINT: 1.0000 "application " | TOPICAL_OINTMENT | Freq: Two times a day (BID) | CUTANEOUS | 0 refills | Status: DC

## 2016-11-23 MED ORDER — OXYCODONE-ACETAMINOPHEN 7.5-325 MG PO TABS: 1.0000 | ORAL_TABLET | Freq: Four times a day (QID) | ORAL | 0 refills | Status: DC | PRN

## 2016-11-23 NOTE — Clinical Social Work Note (Addendum)
Clinical Social Worker facilitated patient discharge including contacting patient family and facility to confirm patient discharge plans.  Clinical information faxed to facility and family agreeable with plan. CSW arranged ambulance transport via [facility] to [facility]. RN to call report to [phone] prior to discharge.  Clinical Social Worker will sign off for now as social work intervention is no longer needed. Please consult us again if new need arises.  Hillman Attig B. Gean QuintBrown,MSW, LCSWA Clinical Social Work Dept Weekend Social Worker [phone] 12:26 PM

## 2016-11-23 NOTE — Evaluation (Signed)
Physical Therapy Evaluation Patient Details Name: Ronald Simpson MRN: [id number] DOB: 02-Dec-1945 Today's Date: 11/23/2016   History of Present Illness  Pt is a 71 year old male with PMH of CAD, Anemia, Chronic combined systolic/diastolic HF, Hep C with Cirrhosis, CKD stage 3, DM, and PVD s/p amputation of left side third/fourth/fifith toe and AKA of right extremity. He underwent surgery 11/21/16 for L BKA.    Clinical Impression  PT eval complete. Pt only agreeable to bed mobility. Pt unwilling to mobilize L knee due to pain. See below for eval details. Pt to d/c back to SNF [facility]) today.     Follow Up Recommendations SNF    Equipment Recommendations  None recommended by PT    Recommendations for Other Services       Precautions / Restrictions Precautions Precautions: Fall      Mobility  Bed Mobility Overal bed mobility: Needs Assistance Bed Mobility: Rolling;Supine to Sit;Sit to Supine Rolling: Modified independent (Device/Increase time)   Supine to sit: Mod assist Sit to supine: Mod assist   General bed mobility comments: +rail, increased time, unable to attain full upright sitting EOB due to pain  Transfers                 General transfer comment: unable  Ambulation/Gait             General Gait Details: nonambulatory  Stairs            Wheelchair Mobility    Modified Rankin (Stroke Patients Only)       Balance                                             Pertinent Vitals/Pain Pain Assessment: Faces Faces Pain Scale: Hurts whole lot Pain Location: L residual limb Pain Descriptors / Indicators: Grimacing;Guarding;Moaning Pain Intervention(s): Limited activity within patient's tolerance;Monitored during session    Home Living Family/patient expects to be discharged to:: Skilled nursing facility                      Prior Function Level of Independence: Needs assistance   Gait / Transfers Assistance  Needed: Pt mod I at w/c level prior to previous L toe amputations.  ADL's / Homemaking Assistance Needed: Pt currently resides at a SNF. Unsure of assist level needed for ADLs. Pt very vague and not forthcoming with information.  Comments: Pt indicates he was not receiving therapies at SNF because of the Lt foot, but he is very evasive with answers, making it difficult to accurately determine his baseline status      Hand Dominance   Dominant Hand: Right    Extremity/Trunk Assessment   Upper Extremity Assessment Upper Extremity Assessment: Overall WFL for tasks assessed    Lower Extremity Assessment Lower Extremity Assessment: LLE deficits/detail LLE: Unable to fully assess due to pain       Communication   Communication: No difficulties  Cognition Arousal/Alertness: Awake/alert Behavior During Therapy: Agitated Overall Cognitive Status: Within Functional Limits for tasks assessed                                        General Comments      Exercises     Assessment/Plan    PT Assessment All further  PT needs can be met in the next venue of care  PT Problem List Decreased strength;Decreased mobility;Decreased activity tolerance;Pain       PT Treatment Interventions      PT Goals (Current goals can be found in the Care Plan section)  Acute Rehab PT Goals Patient Stated Goal: not stated PT Goal Formulation: All assessment and education complete, DC therapy    Frequency     Barriers to discharge        Co-evaluation               AM-PAC PT "6 Clicks" Daily Activity  Outcome Measure Difficulty turning over in bed (including adjusting bedclothes, sheets and blankets)?: A Little Difficulty moving from lying on back to sitting on the side of the bed? : Total Difficulty sitting down on and standing up from a chair with arms (e.g., wheelchair, bedside commode, etc,.)?: Total Help needed moving to and from a bed to chair (including a  wheelchair)?: Total Help needed walking in hospital room?: Total Help needed climbing 3-5 steps with a railing? : Total 6 Click Score: 8    End of Session   Activity Tolerance: Patient limited by pain Patient left: in bed;with call bell/phone within reach Nurse Communication: Mobility status PT Visit Diagnosis: Pain;Muscle weakness (generalized) (M62.81);Other abnormalities of gait and mobility (R26.89) Pain - Right/Left: Left Pain - part of body: Leg    Time: 1240-1255 PT Time Calculation (min) (ACUTE ONLY): 15 min   Charges:   PT Evaluation $PT Eval Moderate Complexity: 1 Mod     PT G Codes:        Ronald Simpson, PT  Office # [phone] Pager [phone]   Ronald Simpson 11/23/2016, 3:27 PM

## 2016-11-23 NOTE — Progress Notes (Signed)
Attempted to contact [facility].

## 2016-11-23 NOTE — Progress Notes (Signed)
Despite engaging discussion with the patient, he still adamantly refuses am labs.  Will notify oncoming nurse so that am rounding team will be aware.

## 2016-11-23 NOTE — Discharge Summary (Signed)
Physician Discharge Summary  Ronald Simpson [id number] DOB: [birth year] DOA: 11/17/2016  PCP: [name], William F, MD  Admit date: 11/17/2016 Discharge date: 11/23/2016  Admitted From: home Disposition:  home   Recommendations for Outpatient Follow-up:  1. cont to check CBGs AC/HS - insulin on hold due to poor oral intake 2. Bmet in 2-3 days- demedex and aldactone on hold  Discharge Condition:  SNF   CODE STATUS:  Full code   Consultations:  Vascular surgery    Discharge Diagnoses:  Principal Problem:   AKI (acute kidney injury) (HCC) Active Problems:   Diabetes mellitus with peripheral vascular disease (HCC)   Hypothyroidism, acquired   Benign essential HTN   Dry gangrene (HCC)   Cirrhosis (HCC)  Subjective: Pain is not uncontrolled. He has no complaints. Giving me a number of excuses about why he does not want a blood draw for labs.   Brief Summary: Ronald PunaFletcher Mccarney is coming from SNF with a PMH of CAD, Anemia, Chronic combined systolic/diastolic HF, Hep C with Cirrhosis, CKD stage 3, DM, PVD s/p amputation of left side third/fourth/fifith toe and AKA of right extremity.  Presents to ED on 8/6 via EMS for weakness. Upon arrival was noted to be hypotensive with systolic 60-70s. Potassium >7.5, Creat 8.48, LA 1.43. Noted to have dry gangrene of left 2 toes.  Recent Admission 7/6-7/12  for sepsis secondary to nonhealing left foot wound, AKI and hyperkalemia. He underwent amputation on 7/9 of left third toedischarged on 2 weeks of doxycycline and Augmentin to SNF.     Hospital Course:  Principal Problem:   Hypotension with AKI  and hyperkalemia -  second admission for the same in setting of Aldactone, Entresto, Demadex -Cr  8.48 >> 1.85 >> 1.01 - K+ > 7.5 >> 3.1 - appreciate Nephro f/u - this is his second admission for AKI related to dehydration. His EF was previously 35-45% and is now 65-70 % - he is refusing labs for 2 days now and I am unable to determine if his Cr is  still stable after amputation - for now, will hold Aldactone, Demadex  - recommend Bmet in 2-3 days and daily weights to decide if medications need to be resumed.     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 - vascular surgery has evaluated him - BKA performed 8/10- stable for discharge from vascular standpoint  Diabetes mellitus with peripheral vascular disease  - Levemir on hold as he is eating poorly - cont to check CBGs AC/HS and resume insulin as needed  Hypokalemia, hypophos - replaced- pt refusing further blood draws and I am unable to tell if these have been replaced adequately  H/o CAD/CABG - aspirin 81 mg and statin     Hypothyroidism, acquired - Synthroid    Benign essential HTN - holding meds as mentioned above     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 Rule out hydralazine drug-induced lupus    Percocet [oxycodone-acetaminophen] Other (See Comments)   3 episodes of unresponsiveness & apnea; he has received a total of 5 doses of Narcan to date for resuscitation (2 in [facility] ED & 3 @ [facility] SNF) Hx of drug abuse (cocaine,  canabis,alcohol, tobacco) Opioids CONTRAINDICATED !!!    Tramadol Other (See Comments)   Excess sedation; ? Related to pre-existing hepatic dysfunction with PMH Hep C   Remeron [mirtazapine]       Medication List    STOP taking these medications   LEVEMIR FLEXTOUCH 100 UNIT/ML Pen Generic drug:  Insulin Detemir   RA SALINE ENEMA 19-7 GM/118ML Enem   saccharomyces boulardii 250 MG capsule Commonly known as:  FLORASTOR   senna 8.6 MG Tabs tablet Commonly known as:  SENOKOT   spironolactone 25 MG tablet Commonly known as:  ALDACTONE   torsemide 20 MG tablet Commonly known as:  DEMADEX     TAKE these medications   acetaminophen 325 MG tablet Commonly known as:  TYLENOL Take 650 mg by mouth every 6 (six) hours as needed (for pain or headaches).   aspirin 81 MG EC tablet Take 1 tablet (81 mg total) by mouth daily.   atorvastatin 20 MG tablet Commonly known as:  LIPITOR Take 20 mg by mouth at bedtime.   barrier cream Crea Commonly known as:  non-specified Apply 1 application topically See admin instructions. Apply to inner right scrotum twice daily for denuded area r/t scrotal edema   bisacodyl 10 MG suppository Commonly known as:  DULCOLAX Place 10 mg rectally once as needed (for constipation not relieved by Milk of Magnesia).   carvedilol 6.25 MG tablet Commonly known as:  COREG Take 6.25 mg by mouth 2 (two) times daily with a meal.   colchicine 0.6 MG tablet Take 0.6 mg by mouth daily.   digoxin 0.125 MG tablet Commonly known as:  LANOXIN Take 1 tablet (0.125 mg total) by mouth daily.   ENSURE ENLIVE PO Take 237 mLs by mouth 3 (three) times daily between meals.   feeding supplement (PRO-STAT SUGAR FREE 64) Liqd Take 30 mLs by mouth 2 (two) times daily.   ferrous sulfate 325 (65 FE) MG tablet Take 1 tablet (325 mg total) by mouth daily with breakfast.   geriatric multivitamins-minerals Elix Take 15 mLs by mouth 2 (two) times daily.    ipratropium-albuterol 0.5-2.5 (3) MG/3ML Soln Commonly known as:  DUONEB Take 3 mLs by nebulization every 6 (six) hours as needed (shortness of breath).   levothyroxine 25 MCG tablet Commonly known as:  SYNTHROID, LEVOTHROID Take 25 mcg by mouth daily before breakfast.   magnesium hydroxide 400 MG/5ML suspension Commonly known as:  MILK OF MAGNESIA Take 30 mLs by mouth once as needed for mild constipation.   mupirocin ointment 2 % Commonly known as:  BACTROBAN Place 1 application into the nose 2 (two) times daily.   nitroGLYCERIN 0.4 MG SL tablet Commonly known as:  NITROSTAT Place 0.4 mg under the tongue every 5 (five) minutes x 3 doses as needed for chest pain.   oxyCODONE-acetaminophen 7.5-325 MG tablet Commonly known as:  PERCOCET Take  1 tablet by mouth every 6 (six) hours as needed (for pain). What changed:  when to take this   OXYGEN Inhale 2 L into the lungs as needed (shortness of breath or O2 sat <90%).   pantoprazole 40 MG tablet Commonly known as:  PROTONIX Take 1 tablet (40 mg total) by mouth daily.   polyethylene glycol packet Commonly known as:  MIRALAX / GLYCOLAX Take 17 g by mouth daily as needed for mild constipation.   sacubitril-valsartan 97-103 MG Commonly known as:  ENTRESTO Take 1 tablet by mouth 2 (two) times daily.       Allergies  Allergen Reactions  . Hydralazine Other (See Comments)    4/21-4/26/18 pleuritic chest pain with bilateral effusions and pericardial rub. Rule out hydralazine drug-induced lupus   . Percocet [Oxycodone-Acetaminophen] Other (See Comments)    3 episodes of unresponsiveness & apnea; he has received a total of 5 doses of Narcan to date for resuscitation (2 in [facility] ED & 3 @ [facility] SNF) Hx of drug abuse (cocaine, canabis,alcohol, tobacco) Opioids CONTRAINDICATED !!!   . Tramadol Other (See Comments)    Excess sedation; ? Related to pre-existing hepatic dysfunction with PMH Hep C  . Remeron [Mirtazapine]       Procedures/Studies: Left below knee amputation   Ct Chest Wo Contrast  Result Date: 11/19/2016 CLINICAL DATA:  Shortness of breath, weakness, renal failure and gangrene of left foot. EXAM: CT CHEST WITHOUT CONTRAST TECHNIQUE: Multidetector CT imaging of the chest was performed following the standard protocol without IV contrast. COMPARISON:  Chest x-ray on 11/17/2016. Prior chest CT on 08/27/2016 FINDINGS: Cardiovascular: The heart size is normal. Evidence of prior CABG is well as mitral valve repair. No pericardial fluid. The thoracic aorta is normal in caliber. Mediastinum/Nodes: Scattered small nonenlarged mediastinal lymph nodes. The largest measures 10 mm in short axis anterior to the carina. No obvious hilar lymphadenopathy. Lungs/Pleura: Within both posterior lower lobes, there is evidence of mild bronchiectasis with prominent bronchial wall thickening and scarring bilaterally. This appears likely to be mostly chronic. A component of active bronchitis or with patchy pneumonia would be difficult to exclude. There is some associated pleural thickening without pleural fluid. No pulmonary mass lesions or pneumothorax. Upper Abdomen: The liver demonstrates evidence of cirrhosis. There is a trace amount of ascites around the liver. Partially calcified gallstones visualized. Musculoskeletal: No chest wall mass or suspicious bone lesions identified. IMPRESSION: 1. Mild bronchiectasis with prominent bronchial wall thickening and pulmonary scarring in both lower lobes. This appears to most likely be chronic. A component of active bronchitis/pneumonia would be difficult to exclude. 2. Evidence of cirrhosis with trace ascites around the liver. Electronically Signed   By: Irish Lack M.D.   On: 11/19/2016 23:28   US Renal  Result Date: 11/19/2016 CLINICAL DATA:  Acute onset of renal insufficiency. Initial encounter. EXAM: RENAL / URINARY TRACT ULTRASOUND COMPLETE COMPARISON:  CT of the abdomen and  pelvis performed 12/27/2015 FINDINGS: Right Kidney: Length: 11.6 cm. Echogenicity within normal limits. No mass or hydronephrosis visualized. Left Kidney: Length: 9.1 cm. Echogenicity within normal limits. No mass or hydronephrosis visualized. Bladder: Decompressed, with a Foley catheter in place. A stone is noted within the gallbladder. IMPRESSION: 1. Unremarkable renal ultrasound. 2. Cholelithiasis.  Gallbladder otherwise grossly unremarkable. Electronically Signed   By: Roanna Raider M.D.   On: 11/19/2016 03:33   Dg Chest Port 1 View  Result Date: 11/17/2016 CLINICAL DATA:  Hypotension. EXAM: PORTABLE CHEST 1 VIEW COMPARISON:  Radiograph  of Sep 02, 2016. FINDINGS: The heart size and mediastinal contours are within normal limits. Status post cardiac valve repair. No pneumothorax or pleural effusion is noted. Left lung is clear. Nodular density is seen in right lung base. No consolidative process is noted. The visualized skeletal structures are unremarkable. IMPRESSION: Nodular density seen in right lung base which may represent overlying nipple shadow repeat radiograph with nipple markers is recommended to rule out pulmonary nodule. No other acute abnormality is noted. Electronically Signed   By: Lupita Raider, M.D.   On: 11/17/2016 19:36   Dg Foot Complete Left  Result Date: 11/17/2016 CLINICAL DATA:  History of gangrene of left foot and toes and status post recent left third toe amputation on 10/20/2016. Left foot wound. History of diabetes. EXAM: LEFT FOOT - COMPLETE 3+ VIEW COMPARISON:  10/17/2016 FINDINGS: Interval metatarsal amputation of the third toe. AP view shows focal erosion involving the lateral aspect of the second metatarsal head which appears new. This is concerning for potential osteomyelitis. No fracture identified. Stable extensive vascular calcifications. Stable retained metallic foreign bodies. IMPRESSION: Erosion of the lateral aspect of the left second metatarsal head is concerning  for focal osteomyelitis. Electronically Signed   By: Irish Lack M.D.   On: 11/17/2016 19:39       Discharge Exam: Vitals:   11/22/16 2115 11/23/16 0610  BP: 128/70 117/75  Pulse: 99 98  Resp: 16 16  Temp: 98.6 F (37 C) 99.7 F (37.6 C)  SpO2: 97% 100%   Vitals:   11/22/16 1429 11/22/16 2115 11/23/16 0500 11/23/16 0610  BP: 124/65 128/70  117/75  Pulse: (!) 110 99  98  Resp: 20 16  16   Temp: 99.3 F (37.4 C) 98.6 F (37 C)  99.7 F (37.6 C)  TempSrc: Oral Oral  Oral  SpO2: 100% 97%  100%  Weight:   61.4 kg (135 lb 5.8 oz)   Height:        General: Pt is alert, awake, not in acute distress Cardiovascular: RRR, S1/S2 +, no rubs, no gallops Respiratory: CTA bilaterally, no wheezing, no rhonchi Abdominal: Soft, NT, ND, bowel sounds + Extremities: no edema, no cyanosis    The results of significant diagnostics from this hospitalization (including imaging, microbiology, ancillary and laboratory) are listed below for reference.     Microbiology: Recent Results (from the past 240 hour(s))  Culture, blood (Routine x 2)     Status: None   Collection Time: 11/17/16  5:55 PM  Result Value Ref Range Status   Specimen Description BLOOD LEFT HAND  Final   Special Requests   Final    BOTTLES DRAWN AEROBIC ONLY Blood Culture adequate volume   Culture NO GROWTH 5 DAYS  Final   Report Status 11/22/2016 FINAL  Final  Culture, blood (Routine x 2)     Status: None   Collection Time: 11/17/16  6:48 PM  Result Value Ref Range Status   Specimen Description BLOOD RIGHT ANTECUBITAL  Final   Special Requests IN PEDIATRIC BOTTLE Blood Culture adequate volume  Final   Culture NO GROWTH 5 DAYS  Final   Report Status 11/22/2016 FINAL  Final  MRSA PCR Screening     Status: Abnormal   Collection Time: 11/18/16 12:12 AM  Result Value Ref Range Status   MRSA by PCR POSITIVE (A) NEGATIVE Final    Comment:        The GeneXpert MRSA Assay (FDA approved for NASAL specimens only), is  one  component of a comprehensive MRSA colonization surveillance program. It is not intended to diagnose MRSA infection nor to guide or monitor treatment for MRSA infections. RESULT CALLED TO, READ BACK BY AND VERIFIED WITH: D TURNER 11/18/16 @ 0750 M VESTAL   Culture, Urine     Status: Abnormal   Collection Time: 11/18/16 12:12 AM  Result Value Ref Range Status   Specimen Description URINE, RANDOM  Final   Special Requests Normal  Final   Culture >=100,000 COLONIES/mL YEAST (A)  Final   Report Status 11/19/2016 FINAL  Final     Labs: BNP (last 3 results)  Recent Labs  08/02/16 1610 08/27/16 0016  BNP 1,021.2* 1,093.5*   Basic Metabolic Panel:  Recent Labs Lab 11/18/16 0601 11/18/16 0900 11/18/16 1957 11/19/16 0217 11/20/16 0536 11/21/16 0401  NA 137  --  139 141 141 139  K 5.8* 5.1 4.4 3.5 3.1* 3.1*  CL 100*  --  98* 95* 95* 98*  CO2 19*  --  26 30 37* 34*  GLUCOSE 126*  --  85 88 196* 214*  BUN 172*  --  153* 138* 78* 42*  CREATININE 6.16*  --  4.61* 3.84* 1.85* 1.01  CALCIUM 9.2  --  8.4* 8.2* 8.6* 8.2*  MG 2.0  --   --  1.6* 1.8 1.6*  PHOS 5.5*  --   --  3.6 2.0* 1.2*   Liver Function Tests:  Recent Labs Lab 11/17/16 1755 11/19/16 0217 11/20/16 0536  AST 17  --   --   ALT 10*  --   --   ALKPHOS 186*  --   --   BILITOT 1.3*  --   --   PROT 8.8*  --   --   ALBUMIN 2.6* 2.0* 2.0*   No results for input(s): LIPASE, AMYLASE in the last 168 hours.  Recent Labs Lab 11/18/16 0012  AMMONIA 42*   CBC:  Recent Labs Lab 11/17/16 1755 11/18/16 0601 11/19/16 0217 11/20/16 0536 11/21/16 0401  WBC 19.8* 17.3* 16.7* 17.0* 14.6*  NEUTROABS 17.4*  --   --   --   --   HGB 12.5* 12.6* 10.7* 10.9* 9.2*  HCT 39.4 38.4* 32.8* 33.9* 29.7*  MCV 87.2 85.1 84.1 87.6 88.4  PLT 332 298 258 199 186   Cardiac Enzymes: No results for input(s): CKTOTAL, CKMB, CKMBINDEX, TROPONINI in the last 168 hours. BNP: Invalid input(s): POCBNP CBG:  Recent Labs Lab  11/22/16 1752 11/22/16 2114 11/23/16 0052 11/23/16 0538 11/23/16 0809  GLUCAP 119* 176* 118* 134* 136*   D-Dimer No results for input(s): DDIMER in the last 72 hours. Hgb A1c No results for input(s): HGBA1C in the last 72 hours. Lipid Profile No results for input(s): CHOL, HDL, LDLCALC, TRIG, CHOLHDL, LDLDIRECT in the last 72 hours. Thyroid function studies No results for input(s): TSH, T4TOTAL, T3FREE, THYROIDAB in the last 72 hours.  Invalid input(s): FREET3 Anemia work up No results for input(s): VITAMINB12, FOLATE, FERRITIN, TIBC, IRON, RETICCTPCT in the last 72 hours. Urinalysis    Component Value Date/Time   COLORURINE YELLOW 11/18/2016 0012   APPEARANCEUR TURBID (A) 11/18/2016 0012   LABSPEC 1.013 11/18/2016 0012   PHURINE 5.0 11/18/2016 0012   GLUCOSEU NEGATIVE 11/18/2016 0012   HGBUR SMALL (A) 11/18/2016 0012   BILIRUBINUR NEGATIVE 11/18/2016 0012   KETONESUR 5 (A) 11/18/2016 0012   PROTEINUR 100 (A) 11/18/2016 0012   NITRITE NEGATIVE 11/18/2016 0012   LEUKOCYTESUR MODERATE (A) 11/18/2016 0012   Sepsis Labs  Invalid input(s): PROCALCITONIN,  WBC,  LACTICIDVEN Microbiology Recent Results (from the past 240 hour(s))  Culture, blood (Routine x 2)     Status: None   Collection Time: 11/17/16  5:55 PM  Result Value Ref Range Status   Specimen Description BLOOD LEFT HAND  Final   Special Requests   Final    BOTTLES DRAWN AEROBIC ONLY Blood Culture adequate volume   Culture NO GROWTH 5 DAYS  Final   Report Status 11/22/2016 FINAL  Final  Culture, blood (Routine x 2)     Status: None   Collection Time: 11/17/16  6:48 PM  Result Value Ref Range Status   Specimen Description BLOOD RIGHT ANTECUBITAL  Final   Special Requests IN PEDIATRIC BOTTLE Blood Culture adequate volume  Final   Culture NO GROWTH 5 DAYS  Final   Report Status 11/22/2016 FINAL  Final  MRSA PCR Screening     Status: Abnormal   Collection Time: 11/18/16 12:12 AM  Result Value Ref Range Status    MRSA by PCR POSITIVE (A) NEGATIVE Final    Comment:        The GeneXpert MRSA Assay (FDA approved for NASAL specimens only), is one component of a comprehensive MRSA colonization surveillance program. It is not intended to diagnose MRSA infection nor to guide or monitor treatment for MRSA infections. RESULT CALLED TO, READ BACK BY AND VERIFIED WITH: D TURNER 11/18/16 @ 0750 M VESTAL   Culture, Urine     Status: Abnormal   Collection Time: 11/18/16 12:12 AM  Result Value Ref Range Status   Specimen Description URINE, RANDOM  Final   Special Requests Normal  Final   Culture >=100,000 COLONIES/mL YEAST (A)  Final   Report Status 11/19/2016 FINAL  Final     Time coordinating discharge: Over 30 minutes  SIGNED:   Calvert Cantor, MD  [facility] 11/23/2016, 12:00 PM Pager   If 7PM-7AM, please contact night-coverage [email or web address] Password [email or web address]

## 2016-11-23 NOTE — Progress Notes (Signed)
OT Cancellation Note  Patient Details Name: Ronald Simpson MRN: [id number] DOB: August 06, 1945   Cancelled Treatment:    Reason Eval/Treat Not Completed: Other (comment): OT screened, pt with Medicare from SNF and planning to return to SNF this date. Per discussion with PT, pt functioning at baseline for ADL. No further acute OT needs identified and all further OT needs may be met at the next venue of care. Acute OT will sign off and defer further needs to SNF.   Norman Herrlich, MS OTR/L  Pager: [phone]   Norman Herrlich 11/23/2016, 12:58 PM

## 2016-11-23 NOTE — Progress Notes (Signed)
  Progress Note    11/23/2016 10:05 AM 2 Days Post-Op  Subjective:  Pain more tolerable  Vitals:   11/22/16 2115 11/23/16 0610  BP: 128/70 117/75  Pulse: 99 98  Resp: 16 16  Temp: 98.6 F (37 C) 99.7 F (37.6 C)  SpO2: 97% 100%    Physical Exam: aaox3 Left bka cdi with staples  CBC    Component Value Date/Time   WBC 14.6 (H) 11/21/2016 0401   RBC 3.36 (L) 11/21/2016 0401   HGB 9.2 (L) 11/21/2016 0401   HCT 29.7 (L) 11/21/2016 0401   PLT 186 11/21/2016 0401   MCV 88.4 11/21/2016 0401   MCH 27.4 11/21/2016 0401   MCHC 31.0 11/21/2016 0401   RDW 18.3 (H) 11/21/2016 0401   LYMPHSABS 1.3 11/17/2016 1755   MONOABS 1.1 (H) 11/17/2016 1755   EOSABS 0.0 11/17/2016 1755   BASOSABS 0.0 11/17/2016 1755    BMET    Component Value Date/Time   NA 139 11/21/2016 0401   NA 132 (A) 09/30/2016   K 3.1 (L) 11/21/2016 0401   CL 98 (L) 11/21/2016 0401   CO2 34 (H) 11/21/2016 0401   GLUCOSE 214 (H) 11/21/2016 0401   BUN 42 (H) 11/21/2016 0401   BUN 52 (A) 09/30/2016   CREATININE 1.01 11/21/2016 0401   CALCIUM 8.2 (L) 11/21/2016 0401   GFRNONAA >60 11/21/2016 0401   GFRAA >60 11/21/2016 0401    INR    Component Value Date/Time   INR 1.04 10/17/2016 1618     Intake/Output Summary (Last 24 hours) at 11/23/16 1005 Last data filed at 11/22/16 1903  Gross per 24 hour  Intake                0 ml  Output              800 ml  Net             -800 ml     Assessment:  71 y.o. male is s/p left bka. Healing well  Plan: Ok for d/c from vascular standpoint when ok per primary Discussed need to continue to work on knee mobility  Louvina Cleary C. Randie Heinzain, MD [facility] Office: [phone] Pager: [phone]  11/23/2016 10:05 AM

## 2016-11-23 NOTE — Progress Notes (Signed)
Patient discharge teaching given, including activity, diet, follow-up appoints, and medications. Patient verbalized understanding of all discharge instructions. IV access was d/c'd. Vitals are stable. Skin is intact except as charted in most recent assessments. Pt to be escorted out by [facility] to go to [facility].

## 2016-11-23 NOTE — Clinical Social Work Placement (Signed)
   CLINICAL SOCIAL WORK PLACEMENT  NOTE  Date:  11/23/2016  Patient Details  Name: Ronald Simpson MRN: [id number] Date of Birth: 10-20-1945  Clinical Social Work is seeking post-discharge placement for this patient at the Skilled  Nursing Facility level of care (*CSW will initial, date and re-position this form in  chart as items are completed):  Yes   Patient/family provided with [facility] Clinical Social Work Department's list of facilities offering this level of care within the geographic area requested by the patient (or if unable, by the patient's family).  Yes   Patient/family informed of their freedom to choose among providers that offer the needed level of care, that participate in Medicare, Medicaid or managed care program needed by the patient, have an available bed and are willing to accept the patient.  Yes   Patient/family informed of [facility]'s ownership interest in [facility] and [facility], as well as of the fact that they are under no obligation to receive care at these facilities.  PASRR submitted to EDS on       PASRR number received on       Existing PASRR number confirmed on 11/23/16     FL2 transmitted to all facilities in geographic area requested by pt/family on       FL2 transmitted to all facilities within larger geographic area on       Patient informed that his/her managed care company has contracts with or will negotiate with certain facilities, including the following:        Yes   Patient/family informed of bed offers received.  Patient chooses bed at  (Pt from [facility])     Physician recommends and patient chooses bed at      Patient to be transferred to  (Pt from [facility]) on 11/23/16.  Patient to be transferred to facility by  Sharin Mons([facility])     Patient family notified on 11/23/16 of transfer.  Name of family member notified:  [name]     PHYSICIAN       Additional Comment:     _______________________________________________ Norlene DuelBROWN, Jaquann Guarisco B, LCSWA 11/23/2016, 12:31 PM

## 2016-11-24 ENCOUNTER — Telehealth: Payer: Self-pay

## 2016-11-24 ENCOUNTER — Encounter: Payer: Self-pay | Admitting: Adult Health

## 2016-11-24 ENCOUNTER — Non-Acute Institutional Stay (SKILLED_NURSING_FACILITY): Payer: Medicare Other | Admitting: Adult Health

## 2016-11-24 DIAGNOSIS — N179 Acute kidney failure, unspecified: Secondary | ICD-10-CM | POA: Diagnosis not present

## 2016-11-24 DIAGNOSIS — R531 Weakness: Secondary | ICD-10-CM | POA: Diagnosis not present

## 2016-11-24 DIAGNOSIS — R630 Anorexia: Secondary | ICD-10-CM

## 2016-11-24 DIAGNOSIS — M1A9XX0 Chronic gout, unspecified, without tophus (tophi): Secondary | ICD-10-CM | POA: Diagnosis not present

## 2016-11-24 DIAGNOSIS — I2510 Atherosclerotic heart disease of native coronary artery without angina pectoris: Secondary | ICD-10-CM

## 2016-11-24 DIAGNOSIS — I10 Essential (primary) hypertension: Secondary | ICD-10-CM

## 2016-11-24 DIAGNOSIS — I96 Gangrene, not elsewhere classified: Secondary | ICD-10-CM | POA: Diagnosis not present

## 2016-11-24 DIAGNOSIS — D638 Anemia in other chronic diseases classified elsewhere: Secondary | ICD-10-CM

## 2016-11-24 DIAGNOSIS — I5042 Chronic combined systolic (congestive) and diastolic (congestive) heart failure: Secondary | ICD-10-CM

## 2016-11-24 DIAGNOSIS — E785 Hyperlipidemia, unspecified: Secondary | ICD-10-CM

## 2016-11-24 DIAGNOSIS — I480 Paroxysmal atrial fibrillation: Secondary | ICD-10-CM

## 2016-11-24 DIAGNOSIS — Z8719 Personal history of other diseases of the digestive system: Secondary | ICD-10-CM | POA: Diagnosis not present

## 2016-11-24 DIAGNOSIS — Z89611 Acquired absence of right leg above knee: Secondary | ICD-10-CM

## 2016-11-24 DIAGNOSIS — Z89512 Acquired absence of left leg below knee: Secondary | ICD-10-CM

## 2016-11-24 DIAGNOSIS — G8918 Other acute postprocedural pain: Secondary | ICD-10-CM

## 2016-11-24 DIAGNOSIS — Z89519 Acquired absence of unspecified leg below knee: Secondary | ICD-10-CM

## 2016-11-24 DIAGNOSIS — D62 Acute posthemorrhagic anemia: Secondary | ICD-10-CM

## 2016-11-24 DIAGNOSIS — N189 Chronic kidney disease, unspecified: Secondary | ICD-10-CM

## 2016-11-24 DIAGNOSIS — D72829 Elevated white blood cell count, unspecified: Secondary | ICD-10-CM

## 2016-11-24 DIAGNOSIS — E039 Hypothyroidism, unspecified: Secondary | ICD-10-CM | POA: Diagnosis not present

## 2016-11-24 NOTE — Progress Notes (Signed)
DATE:  11/24/2016   MRN:  [id number]  BIRTHDAY: 03-May-1945  Facility:  Nursing Home Location:  [facility] Room Number: [id number]  LEVEL OF CARE:  SNF (31)  Contact Information    Name Relation Home Work Mobile   Glover,[name] Niece [phone]  [phone]   Argyle,Clementine Mother [phone]     Tally, Mckinnon Daughter   [phone]       Code Status History    Date Active Date Inactive Code Status Order ID Comments User Context   11/17/2016  9:57 PM 11/23/2016  5:34 PM Full Code [id number]  Tobey Grim, NP ED   10/17/2016  7:29 PM 10/23/2016  7:44 PM Full Code [id number]  Briscoe Deutscher, MD ED   08/27/2016  2:20 AM 09/06/2016  5:35 PM Full Code [id number]  Kathlene Cote, PA-C ED   08/02/2016  8:29 PM 08/07/2016  6:48 PM Full Code [id number]  Michael Litter, MD ED   05/15/2016  5:59 PM 05/21/2016  9:41 PM Full Code [id number]  Penny Pia, MD Inpatient   04/02/2016  3:19 PM 04/08/2016  8:26 PM Full Code [id number]  Hyacinth Meeker, MD ED   12/27/2015  1:27 AM 01/14/2016  5:17 PM Full Code [id number]  Danford, Earl Lites, MD Inpatient       Chief Complaint  Patient presents with  . Hospitalization Follow-up    Hospital followup    HISTORY OF PRESENT ILLNESS:  This is a 71-YO male seen for hospital follow-up.  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 He had hypotension and AKI so Demadex and Aldactone were held. He has chronic combined systolic and diastolic CHF. No SOB was noted. He has Diabetes Mellitus with last HgbA1c of 7.4. Levemir was held due to poor PO intake. CBG checked this PM showed 170. He was seen in his room today. He asked for more pain medication and said that if I can't give him pain medication then I should get out. He has history of pain medication intolerance. He has a PMH of AKI, severe  hyperkalemia, PVD with gangrene, chronic CHF with systolic dysfunction, acquired hypothyroidism, uncontrolled type 2 diabetes mellitus with complication, cirrhosis, noncompliance, and history of CVA.   PAST MEDICAL HISTORY:  Past Medical History:  Diagnosis Date  . CAD (coronary artery disease)    a. remote CABG.  . Chronic anemia   . Chronic combined systolic and diastolic CHF (congestive heart failure) (HCC)   . Cirrhosis (HCC)   . CKD (chronic kidney disease), stage III   . CVA (cerebral infarction)   . DM (diabetes mellitus), type 2 with peripheral vascular complications (HCC)   . History of hepatitis C 1990s   Hep B immune  . Hypoalbuminemia   . IDDM (insulin dependent diabetes mellitus) (HCC)   . Mitral regurgitation    a. s/p prior ring annuloplasty prosthesis per echo with mod MR by echo 07/2016.  Marland Kitchen PAF (paroxysmal atrial fibrillation) (HCC)    a. not on anticoagulation likely due to h/o IC hemorrhage, prior GIB, cirrhosis. b. suspected prior LAA clipping based on imaging.  . Peripheral neuropathy   . Peripheral vascular disease (HCC)   . Proteinuria      CURRENT MEDICATIONS: Reviewed  Patient's Medications  New Prescriptions   No medications on file  Previous Medications   ACETAMINOPHEN (TYLENOL) 325  MG TABLET    Take 650 mg by mouth every 6 (six) hours as needed (for pain or headaches).    AMINO ACIDS-PROTEIN HYDROLYS (FEEDING SUPPLEMENT, PRO-STAT SUGAR FREE 64,) LIQD    Take 30 mLs by mouth 2 (two) times daily.   ASPIRIN EC 81 MG EC TABLET    Take 1 tablet (81 mg total) by mouth daily.   ATORVASTATIN (LIPITOR) 20 MG TABLET    Take 20 mg by mouth at bedtime.    BARRIER CREAM (NON-SPECIFIED) CREA    Apply 1 application topically See admin instructions. Apply to inner right scrotum twice daily for denuded area r/t scrotal edema   BISACODYL (DULCOLAX) 10 MG SUPPOSITORY    Place 10 mg rectally once as needed (for constipation not relieved by Milk of Magnesia).   CARVEDILOL  (COREG) 6.25 MG TABLET    Take 6.25 mg by mouth 2 (two) times daily with a meal.   COLCHICINE 0.6 MG TABLET    Take 0.6 mg by mouth daily.   DIGOXIN (LANOXIN) 0.125 MG TABLET    Take 1 tablet (0.125 mg total) by mouth daily.   FERROUS SULFATE 325 (65 FE) MG TABLET    Take 1 tablet (325 mg total) by mouth daily with breakfast.   GERIATRIC MULTIVITAMINS-MINERALS (ELDERTONIC/GEVRABON) ELIX    Take 15 mLs by mouth 2 (two) times daily.   IPRATROPIUM-ALBUTEROL (DUONEB) 0.5-2.5 (3) MG/3ML SOLN    Take 3 mLs by nebulization every 6 (six) hours as needed (shortness of breath).   LEVOTHYROXINE (SYNTHROID, LEVOTHROID) 25 MCG TABLET    Take 25 mcg by mouth daily before breakfast.   MAGNESIUM HYDROXIDE (MILK OF MAGNESIA) 400 MG/5ML SUSPENSION    Take 30 mLs by mouth once as needed for mild constipation.   MUPIROCIN OINTMENT (BACTROBAN) 2 %    Place 1 application into the nose 2 (two) times daily.   NITROGLYCERIN (NITROSTAT) 0.4 MG SL TABLET    Place 0.4 mg under the tongue every 5 (five) minutes x 3 doses as needed for chest pain.    NUTRITIONAL SUPPLEMENTS (ENSURE ENLIVE PO)    Take 237 mLs by mouth 3 (three) times daily between meals.   OXYCODONE-ACETAMINOPHEN (PERCOCET) 7.5-325 MG TABLET    Take 1 tablet by mouth every 8 (eight) hours as needed for severe pain.   OXYGEN    Inhale 2 L into the lungs as needed (shortness of breath or O2 sat <90%).    PANTOPRAZOLE (PROTONIX) 40 MG TABLET    Take 1 tablet (40 mg total) by mouth daily.   POLYETHYLENE GLYCOL (MIRALAX / GLYCOLAX) PACKET    Take 17 g by mouth daily as needed for mild constipation.   SACUBITRIL-VALSARTAN (ENTRESTO) 97-103 MG    Take 1 tablet by mouth 2 (two) times daily.  Modified Medications   No medications on file  Discontinued Medications   OXYCODONE-ACETAMINOPHEN (PERCOCET) 7.5-325 MG TABLET    Take 1 tablet by mouth every 6 (six) hours as needed (for pain).     Allergies  Allergen Reactions  . Hydralazine Other (See Comments)     4/21-4/26/18 pleuritic chest pain with bilateral effusions and pericardial rub. Rule out hydralazine drug-induced lupus   . Percocet [Oxycodone-Acetaminophen] Other (See Comments)    3 episodes of unresponsiveness & apnea; he has received a total of 5 doses of Narcan to date for resuscitation (2 in [facility] ED & 3 @ [facility]) Hx of drug abuse (cocaine, canabis,alcohol, tobacco) Opioids CONTRAINDICATED !!!   .  Tramadol Other (See Comments)    Excess sedation; ? Related to pre-existing hepatic dysfunction with PMH Hep C  . Remeron [Mirtazapine]      REVIEW OF SYSTEMS:  GENERAL: has poor appetite MOUTH and THROAT: Denies oral discomfort, gingival pain or bleeding, pain from teeth or hoarseness   RESPIRATORY: no cough, SOB, DOE, wheezing, hemoptysis CARDIAC: no chest pain, edema or palpitations GI: no abdominal pain, diarrhea, constipation, heart burn, nausea or vomiting PSYCHIATRIC: +agitation   PHYSICAL EXAMINATION  GENERAL APPEARANCE: Well nourished. In no acute distress. Normal body habitus SKIN:  Left BKA stump with staples, dry and no erythema HEAD: Normal in size and contour. No evidence of trauma EYES: Lids open and close normally. No blepharitis, entropion or ectropion.  EARS: Pinnae are normal. Patient hears normal voice tunes of the examiner MOUTH and THROAT: Lips are without lesions. Oral mucosa is moist and without lesions. Tongue is normal in shape, size, and color and without lesions NECK: supple, trachea midline, no neck masses, no thyroid tenderness, no thyromegaly LYMPHATICS: no LAN in the neck, no supraclavicular LAN RESPIRATORY: breathing is even & unlabored, BS CTAB CARDIAC: Irregular heart rate, no murmur,no extra heart sounds, no edema GI: abdomen soft, normal BS, no masses, no tenderness, no hepatomegaly, no splenomegaly GU:  Has foley catheter draining to urine bag with yellowish urine EXTREMITIES:  Old right AKA, left BKA stump with staples, able to move X 4  extremities PSYCHIATRIC: Alert to self and place, disoriented to time. Agitated   LABS/RADIOLOGY: Labs reviewed: Basic Metabolic Panel:  Recent Labs  09/81/19 0217 11/20/16 0536 11/21/16 0401  NA 141 141 139  K 3.5 3.1* 3.1*  CL 95* 95* 98*  CO2 30 37* 34*  GLUCOSE 88 196* 214*  BUN 138* 78* 42*  CREATININE 3.84* 1.85* 1.01  CALCIUM 8.2* 8.6* 8.2*  MG 1.6* 1.8 1.6*  PHOS 3.6 2.0* 1.2*   Liver Function Tests:  Recent Labs  10/21/16 0306 10/23/16 0330 11/17/16 1755 11/19/16 0217 11/20/16 0536  AST 13* 16 17  --   --   ALT 10* 10* 10*  --   --   ALKPHOS 134* 152* 186*  --   --   BILITOT 0.7 0.6 1.3*  --   --   PROT 6.2* 6.3* 8.8*  --   --   ALBUMIN 2.0* 1.8* 2.6* 2.0* 2.0*    Recent Labs  04/02/16 1121 05/18/16 1111 11/18/16 0012  AMMONIA 53* 31 42*   CBC:  Recent Labs  08/27/16 0000  10/18/16 0321  11/17/16 1755  11/19/16 0217 11/20/16 0536 11/21/16 0401  WBC 9.6  < > 8.6  < > 19.8*  < > 16.7* 17.0* 14.6*  NEUTROABS 8.2*  --  6.2  --  17.4*  --   --   --   --   HGB 9.7*  < > 13.7  < > 12.5*  < > 10.7* 10.9* 9.2*  HCT 31.3*  < > 43.1  < > 39.4  < > 32.8* 33.9* 29.7*  MCV 85.3  < > 87.1  < > 87.2  < > 84.1 87.6 88.4  PLT 268  < > 202  < > 332  < > 258 199 186  < > = values in this interval not displayed. Lipid Panel:  Recent Labs  06/09/16 06/11/16  HDL 38 42   Cardiac Enzymes:  Recent Labs  08/27/16 0016 08/27/16 0411 08/27/16 1120  TROPONINI 0.03* 0.07* 0.06*   CBG:  Recent  Labs  11/23/16 0052 11/23/16 0538 11/23/16 0809  GLUCAP 118* 134* 136*      Ct Chest Wo Contrast  Result Date: 11/19/2016 CLINICAL DATA:  Shortness of breath, weakness, renal failure and gangrene of left foot. EXAM: CT CHEST WITHOUT CONTRAST TECHNIQUE: Multidetector CT imaging of the chest was performed following the standard protocol without IV contrast. COMPARISON:  Chest x-ray on 11/17/2016. Prior chest CT on 08/27/2016 FINDINGS: Cardiovascular: The  heart size is normal. Evidence of prior CABG is well as mitral valve repair. No pericardial fluid. The thoracic aorta is normal in caliber. Mediastinum/Nodes: Scattered small nonenlarged mediastinal lymph nodes. The largest measures 10 mm in short axis anterior to the carina. No obvious hilar lymphadenopathy. Lungs/Pleura: Within both posterior lower lobes, there is evidence of mild bronchiectasis with prominent bronchial wall thickening and scarring bilaterally. This appears likely to be mostly chronic. A component of active bronchitis or with patchy pneumonia would be difficult to exclude. There is some associated pleural thickening without pleural fluid. No pulmonary mass lesions or pneumothorax. Upper Abdomen: The liver demonstrates evidence of cirrhosis. There is a trace amount of ascites around the liver. Partially calcified gallstones visualized. Musculoskeletal: No chest wall mass or suspicious bone lesions identified. IMPRESSION: 1. Mild bronchiectasis with prominent bronchial wall thickening and pulmonary scarring in both lower lobes. This appears to most likely be chronic. A component of active bronchitis/pneumonia would be difficult to exclude. 2. Evidence of cirrhosis with trace ascites around the liver. Electronically Signed   By: Irish Lack M.D.   On: 11/19/2016 23:28   US Renal  Result Date: 11/19/2016 CLINICAL DATA:  Acute onset of renal insufficiency. Initial encounter. EXAM: RENAL / URINARY TRACT ULTRASOUND COMPLETE COMPARISON:  CT of the abdomen and pelvis performed 12/27/2015 FINDINGS: Right Kidney: Length: 11.6 cm. Echogenicity within normal limits. No mass or hydronephrosis visualized. Left Kidney: Length: 9.1 cm. Echogenicity within normal limits. No mass or hydronephrosis visualized. Bladder: Decompressed, with a Foley catheter in place. A stone is noted within the gallbladder. IMPRESSION: 1. Unremarkable renal ultrasound. 2. Cholelithiasis.  Gallbladder otherwise grossly  unremarkable. Electronically Signed   By: Roanna Raider M.D.   On: 11/19/2016 03:33   Dg Chest Port 1 View  Result Date: 11/17/2016 CLINICAL DATA:  Hypotension. EXAM: PORTABLE CHEST 1 VIEW COMPARISON:  Radiograph of Sep 02, 2016. FINDINGS: The heart size and mediastinal contours are within normal limits. Status post cardiac valve repair. No pneumothorax or pleural effusion is noted. Left lung is clear. Nodular density is seen in right lung base. No consolidative process is noted. The visualized skeletal structures are unremarkable. IMPRESSION: Nodular density seen in right lung base which may represent overlying nipple shadow repeat radiograph with nipple markers is recommended to rule out pulmonary nodule. No other acute abnormality is noted. Electronically Signed   By: Lupita Raider, M.D.   On: 11/17/2016 19:36   Dg Foot Complete Left  Result Date: 11/17/2016 CLINICAL DATA:  History of gangrene of left foot and toes and status post recent left third toe amputation on 10/20/2016. Left foot wound. History of diabetes. EXAM: LEFT FOOT - COMPLETE 3+ VIEW COMPARISON:  10/17/2016 FINDINGS: Interval metatarsal amputation of the third toe. AP view shows focal erosion involving the lateral aspect of the second metatarsal head which appears new. This is concerning for potential osteomyelitis. No fracture identified. Stable extensive vascular calcifications. Stable retained metallic foreign bodies. IMPRESSION: Erosion of the lateral aspect of the left second metatarsal head is concerning for focal  osteomyelitis. Electronically Signed   By: Irish LackGlenn  Yamagata M.D.   On: 11/17/2016 19:39    ASSESSMENT/PLAN:  1. Generalized weakness - for rehabilitation, PT and OT for therapeutic strengthening exercises; all precautions   2. 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 Gretta BeganEarly Todd),; he has intolerance to  Percocet so Percocet will be decreased to Percocet 5-325 mg 1 tab PO Q 12 Hours PRN and acetaminophen 325 mg give 2 tabs = 650 mg by mouth every 6 hours when necessary for pain   3. Acute kidney injury superimposed on chronic kidney disease (HCC) - GFR >60; re-check BMP; currently Demadex and Aldactone on hold Lab Results  Component Value Date   CREATININE 1.01 11/21/2016    4. Chronic combined systolic and diastolic congestive heart failure (HCC) - no SOB; continue Entresto 97-103 MG 1 tab by mouth twice a day, carvedilol 6.25 mg 1 tab by mouth twice a day and digoxin 125 g 1 tab daily, currently Aldactone and Demadex on hold due to AKI; weigh daily and notify provider for weight gain of >= 3 lbs   5. CAD in native artery - no complaints of chest pains, continue NTG when necessary and aspirin EC 81 mg 1 tab daily   6. Hypothyroidism, unspecified type - continue levothyroxine 25 g 1 tab daily Lab Results  Component Value Date   TSH 2.74 07/18/2016     7. Chronic gout without tophus, unspecified cause, unspecified site - continue colchicine 0.6 mg 1 tab daily   8. Anemia associated with acute blood loss - continue ferrous sulfate 325 mg 1 tab daily, recheck CBC Lab Results  Component Value Date   HGB 9.2 (L) 11/21/2016    9. PAF (paroxysmal atrial fibrillation) (HCC) - rate controlled; continue carvedilol 6.25 mg 1 tab twice a day and digoxin 125 g 1 tab daily, not on any anticoagulation due to history of GI bleed Lab Results  Component Value Date   DIGOXIN 0.8 10/19/2016    10. Hyperlipidemia, unspecified hyperlipidemia type - continue atorvastatin 20 mg 1 tab daily Lab Results  Component Value Date   CHOL 88 06/11/2016   HDL 42 06/11/2016   LDLCALC 30 06/11/2016   TRIG 84 06/11/2016     11. Poor appetite - continue Eldertonic 15 mL twice a day, pro-stat 30 mL twice a day and ensure 237 mL 3 times daily   12. History of GI bleed - continue pantoprazole 40 mg 1 tab  daily   13. Diabetes Mellitus, type 2 - Levemir on hold due to poor oral intake, check CBG before meals at bedtime Lab Results  Component Value Date   HGBA1C 7.4 (H) 10/20/2016    14. Benign essential hypertension - currently Demadex and Aldactone on hold due to hypotension, continue carvedilol 6.25 mg 1 tab BID; check BP/HR twice a day 1 week   15. Leukocytosis -  due to necrosis; afebrile, re-check CBC     Goals of care:  Long-term care    Ariv Penrod C. Medina-Vargas - NP    [facility] [phone]

## 2016-11-24 NOTE — Telephone Encounter (Signed)
This is a patient of [facility], who was [facility]

## 2016-11-24 NOTE — Consult Note (Signed)
Physical Medicine and Rehabilitation Consult Reason for Consult: Left BKA  HPI: Ronald Simpson is a 71 y.o. male with pmh of PVD, neuropathy, PAF, DM type 2, CVA, CKD, CHF, CAD, right AKA presented on 11/17/16 with weakness and pain. History taken from chart review. He was notes to be hypotensive with AKI.  He was previously admitted for sepsis for left foot infection and had an amputation of third toe, discharged to SNF on abx.  He had decrease in PO intake and AMS.  Evaluated by Vascular surgery and underwent left BKA. IVF started and meds adjusted.   Review of Systems  Constitutional: Negative for chills and fever.  Musculoskeletal: Positive for back pain, joint pain and myalgias.  All other systems reviewed and are negative.  Past Medical History:  Diagnosis Date  . CAD (coronary artery disease)    a. remote CABG.  . Chronic anemia   . Chronic combined systolic and diastolic CHF (congestive heart failure) (HCC)   . Cirrhosis (HCC)   . CKD (chronic kidney disease), stage III   . CVA (cerebral infarction)   . DM (diabetes mellitus), type 2 with peripheral vascular complications (HCC)   . History of hepatitis C 1990s   Hep B immune  . Hypoalbuminemia   . IDDM (insulin dependent diabetes mellitus) (HCC)   . Mitral regurgitation    a. s/p prior ring annuloplasty prosthesis per echo with mod MR by echo 07/2016.  Marland Kitchen PAF (paroxysmal atrial fibrillation) (HCC)    a. not on anticoagulation likely due to h/o IC hemorrhage, prior GIB, cirrhosis. b. suspected prior LAA clipping based on imaging.  . Peripheral neuropathy   . Peripheral vascular disease (HCC)   . Proteinuria    Past Surgical History:  Procedure Laterality Date  . AMPUTATION Right 01/07/2016   Procedure: AMPUTATION ABOVE KNEE;  Surgeon: Larina Earthly, MD;  Location: [facility] OR;  Service: Vascular;  Laterality: Right;  . AMPUTATION Left 04/04/2016   Procedure: LEFT FOURTH AND FIFTH  TOE AMPUTATION;  Surgeon: Larina Earthly, MD;   Location: [facility] OR;  Service: Vascular;  Laterality: Left;  . BELOW KNEE LEG AMPUTATION Right   . CORONARY ARTERY BYPASS GRAFT  2005  . ESOPHAGOGASTRODUODENOSCOPY N/A 05/17/2016   Procedure: ESOPHAGOGASTRODUODENOSCOPY (EGD);  Surgeon: Meryl Dare, MD;  Location[facility] ENDOSCOPY;  Service: Endoscopy;  Laterality: N/A;  . IR GENERIC HISTORICAL  07/07/2016   IR PARACENTESIS 07/07/2016 Gershon Crane, PA-C [facility]INTER[facility] RAD  . IR THORACENTESIS ASP PLEURAL SPACE W/IMG GUIDE  08/04/2016  . PERIPHERAL VASCULAR CATHETERIZATION N/A 01/11/2016   Procedure: Lower Extremity Angiography;  Surgeon: Nada Libman, MD;  Location: [facility] INVASIVE CV LAB;  Service: Cardiovascular;  Laterality: N/A;  . STERNOTOMY    . TRANSMETATARSAL AMPUTATION Left 10/20/2016   Procedure: LEFT THIRD TOE  AMPUTATION;  Surgeon: Larina Earthly, MD;  Location: [facility] OR;  Service: Vascular;  Laterality: Left;  . TRANSMETATARSAL AMPUTATION Left 11/21/2016   Procedure: LEFT BELOW KNEE AMPUTATION;  Surgeon: Larina Earthly, MD;  Location: [facility] OR;  Service: Vascular;  Laterality: Left;   Family History  Problem Relation Age of Onset  . Hypertension Other   . Diabetes Other   . Mental illness Other   . Lupus Other   . Diabetes Father   . Cancer Neg Hx   . Heart disease Neg Hx   . Stroke Neg Hx    Social History:  reports that he quit smoking about 7 months ago.  His smoking use included Cigarettes. He has a 5.40 pack-year smoking history. He has never used smokeless tobacco. He reports that he drinks alcohol. He reports that he uses drugs, including Cocaine and Marijuana. Allergies:  Allergies  Allergen Reactions  . Hydralazine Other (See Comments)    4/21-4/26/18 pleuritic chest pain with bilateral effusions and pericardial rub. Rule out hydralazine drug-induced lupus   . Percocet [Oxycodone-Acetaminophen] Other (See Comments)    3 episodes of unresponsiveness & apnea; he has received a total of 5 doses of Narcan to date for resuscitation (2 in  [facility] ED & 3 @ [facility]) Hx of drug abuse (cocaine, canabis,alcohol, tobacco) Opioids CONTRAINDICATED !!!   . Tramadol Other (See Comments)    Excess sedation; ? Related to pre-existing hepatic dysfunction with PMH Hep C  . Remeron [Mirtazapine]    No prescriptions prior to admission.    Home: Home Living Family/patient expects to be discharged to:: Skilled nursing facility Living Arrangements: Parent  Functional History: Prior Function Level of Independence: Needs assistance Gait / Transfers Assistance Needed: Pt mod I at w/c level prior to previous L toe amputations. ADL's / Homemaking Assistance Needed: Pt currently resides at a SNF. Unsure of assist level needed for ADLs. Pt very vague and not forthcoming with information. Comments: Pt indicates he was not receiving therapies at SNF because of the Lt foot, but he is very evasive with answers, making it difficult to accurately determine his baseline status  Functional Status:  Mobility: Bed Mobility Overal bed mobility: Needs Assistance Bed Mobility: Rolling, Supine to Sit, Sit to Supine Rolling: Modified independent (Device/Increase time) Supine to sit: Mod assist Sit to supine: Mod assist General bed mobility comments: +rail, increased time, unable to attain full upright sitting EOB due to pain Transfers General transfer comment: unable Ambulation/Gait General Gait Details: nonambulatory    ADL:    Cognition: Cognition Overall Cognitive Status: Within Functional Limits for tasks assessed Orientation Level: Oriented X4 Cognition Arousal/Alertness: Awake/alert Behavior During Therapy: Agitated Overall Cognitive Status: Within Functional Limits for tasks assessed  Blood pressure 117/75, pulse 98, temperature 99.7 F (37.6 C), temperature source Oral, resp. rate 16, height 5\' 9"  (1.753 m), weight 61.4 kg (135 lb 5.8 oz), SpO2 100 %. Physical Exam  Vitals reviewed. Constitutional: He appears well-developed and  well-nourished.  HENT:  Head: Normocephalic and atraumatic.  Eyes: EOM are normal. Right eye exhibits no discharge. Left eye exhibits no discharge.  Neck: Normal range of motion. Neck supple.  Cardiovascular: Regular rhythm.   +Tachycardia  Respiratory: Effort normal and breath sounds normal.  GI: Soft. Bowel sounds are normal.  Musculoskeletal: He exhibits edema and tenderness.  Right AKA Left BKA  Neurological: He is alert.  Motor: B/l UE 5/5 proximal to distal RLE; HF 5/5 proximal to distal LLE: HF 4-/5 (pain inhibition)  Skin:  Incision with staples c/d/i  Psychiatric: He has a normal mood and affect. His behavior is normal.    No results found for this or any previous visit (from the past 24 hour(s)). No results found.  Assessment/Plan: Diagnosis: Left BKA with history of right AKA Labs independently reviewed.  Records reviewed and summated above. Clean amputation daily with soap and water Monitor incision site for signs of infection or impending skin breakdown. Staples to remain in place for 3-4 weeks Stump shrinker, for edema control  Scar mobilization massaging to prevent soft tissue adherence Stump protector during therapies Prevent flexion contractures by implementing the following:   Encourage prone lying for  20-30 mins per day BID to avoid hip flexion  Contractures if medically appropriate;  Avoid pillow under knees when patient is lying in bed in order to prevent both  knee and hip flexion contractures;  Avoid prolonged sitting Post surgical pain control with oral medication Phantom limb pain control with physical modalities including desensitization techniques (gentle self massage to the residual stump,hot packs if sensation intact, US) and mirror therapy, TENS. If ineffective, consider pharmacological treatment for neuropathic pain (e.g gabapentin, pregabalin, amytriptalyine, duloxetine).  When using wheelchair, patient should have knee on amputated side fully  extended with board under the seat cushion.  1. Does the need for close, 24 hr/day medical supervision in concert with the patient's rehab needs make it unreasonable for this patient to be served in a less intensive setting? Potentially  2. Co-Morbidities requiring supervision/potential complications: PVD (cont meds), neuropathy, PAF (monitor HR with increased physical activity), DM (Monitor in accordance with exercise and adjust meds as necessary), history of CVA, CKD (avoid nephrotoxic meds, CHF (Monitor in accordance with increased physical activity and avoid UE resistance excercises), CAD (meds), right AKA, post-op pain (Biofeedback training with therapies to help reduce reliance on opiate pain medications, particularly IV morphine, monitor pain control during therapies, and sedation at rest and titrate to maximum efficacy to ensure participation and gains in therapies), hypokalemia (continue to monitor and replete as necessary), acute on chronic anemia ((transfuse if necessary to ensure appropriate perfusion for increased activity tolerance) 3. Due to bladder management, bowel management, safety, skin/wound care, disease management, medication administration, pain management and patient education, does the patient require 24 hr/day rehab nursing? Yes 4. Does the patient require coordinated care of a physician, rehab nurse, PT (1-2 hrs/day, 5 days/week) and OT (1-2 hrs/day, 5 days/week) to address physical and functional deficits in the context of the above medical diagnosis(es)? Yes Addressing deficits in the following areas: balance, endurance, locomotion, strength, transferring, bathing, dressing, toileting and psychosocial support 5. Can the patient actively participate in an intensive therapy program of at least 3 hrs of therapy per day at least 5 days per week? No 6. The potential for patient to make measurable gains while on inpatient rehab is excellent 7. Anticipated functional outcomes upon  discharge from inpatient rehab are supervision and min assist  with PT, supervision and min assist with OT, n/a with SLP. 8. Estimated rehab length of stay to reach the above functional goals is: 17-20 days. 9. Does the patient have adequate social supports and living environment to accommodate these discharge functional goals? No 10. Anticipated D/C setting: SNF 11. Anticipated post D/C treatments: SNF 12. Overall Rehab/Functional Prognosis: good  RECOMMENDATIONS: This patient's condition is appropriate for continued rehabilitative care in the following setting: Awaited therapy evals, recommended SNF and pt discharged to SNF.  Believe this is appropriate.  Recommend PM&R outpatient follow up. Patient has agreed to participate in recommended program. Potentially Note that insurance prior authorization may be required for reimbursement for recommended care.  Comment: Rehab Admissions Coordinator to follow up.  Maryla MorrowAnkit Indie Boehne, MD, [place] DomFAAPMR 11/24/2016

## 2016-11-25 ENCOUNTER — Ambulatory Visit: Payer: Medicare Other | Admitting: Vascular Surgery

## 2016-11-26 ENCOUNTER — Other Ambulatory Visit: Payer: Self-pay

## 2016-11-26 ENCOUNTER — Non-Acute Institutional Stay (SKILLED_NURSING_FACILITY): Payer: Medicare Other | Admitting: Internal Medicine

## 2016-11-26 ENCOUNTER — Encounter: Payer: Self-pay | Admitting: Internal Medicine

## 2016-11-26 DIAGNOSIS — I5022 Chronic systolic (congestive) heart failure: Secondary | ICD-10-CM | POA: Diagnosis not present

## 2016-11-26 DIAGNOSIS — R188 Other ascites: Secondary | ICD-10-CM

## 2016-11-26 DIAGNOSIS — K7460 Unspecified cirrhosis of liver: Secondary | ICD-10-CM

## 2016-11-26 DIAGNOSIS — E1151 Type 2 diabetes mellitus with diabetic peripheral angiopathy without gangrene: Secondary | ICD-10-CM | POA: Diagnosis not present

## 2016-11-26 DIAGNOSIS — I480 Paroxysmal atrial fibrillation: Secondary | ICD-10-CM | POA: Diagnosis not present

## 2016-11-26 DIAGNOSIS — N289 Disorder of kidney and ureter, unspecified: Secondary | ICD-10-CM

## 2016-11-26 DIAGNOSIS — N489 Disorder of penis, unspecified: Secondary | ICD-10-CM

## 2016-11-26 DIAGNOSIS — E039 Hypothyroidism, unspecified: Secondary | ICD-10-CM

## 2016-11-26 NOTE — Progress Notes (Signed)
Order written and [facility] DON notified that resident needs to see Dr. Arbie CookeyEarly, vascular surgeon.  He was supposed to follow up with Dr. Arbie CookeyEarly on 11/25/2016, but refused.

## 2016-11-26 NOTE — Progress Notes (Addendum)
Location:  [facility] Room Number: 113-A Place of Service:  SNF (31) Provider:  Freddie Breech. Chales Abrahams, MD   Patient Care Team: Pecola Lawless, MD as PCP - General (Internal Medicine)  Extended Emergency Contact Information Primary Emergency Contact: Harrold Donath [place] of [place] Home Phone: [phone] Mobile Phone: [phone] Relation: Niece Secondary Emergency Contact: Gentz,Clementine Address: [address]          [place], [place] [address] [place] of [place] Home Phone: [phone] Relation: Mother  Code Status:  Full Code Goals of care: Advanced Directive information Advanced Directives 11/20/2016  Does Patient Have a Medical Advance Directive? -  Type of Advance Directive -  Does patient want to make changes to medical advance directive? No - Patient declined  Copy of Healthcare Power of Attorney in Chart? -  Would patient like information on creating a medical advance directive? No - Patient declined  Pre-existing out of facility DNR order (yellow form or pink MOST form) -     Chief Complaint  Patient presents with  . Readmit To SNF    Readmission to [facility]    HPI:  Pt is a 71 y.o. male seen today for medical management of chronic diseases.  Patient has h/o CAD,  Anemia, Chronic CHF, Hep C with Cirrhosis, CKD stage 3, Diabetes Mellitus ,PVD , s/p AKA of right extremity. He also has h/o Drug abuse and Altered mental status due to Narcotics overdose, h/o PAF not on any Anticoagulation  Patient has been long term resident of [facility]. It seems he has been very non Compliant and refuses treatments and Blood draws.  He was Readmitted to hospital with worsening weakness, hypotension and sepsis due to Non Healing Left Foot wound.  He underwent BKA on 08/10 He also had Hyperkalemia and acute renal failure with Creat peaking at 8.48 and Potassium of 7.5 He is discharged to the facility for therapy. In the SNF he is again refusing daily  weights. Refusing Blood draw. Refusing Accu checks and refusing to work with therapy. He denied any chest pain, Cough or swelling. He says his pain is not controlled but is bearable He Wanted me to examine his Penis as he has some lesions appear on the shaft. They are not Painful. They do have smell to it. Denies Dysuria   Past Medical History:  Diagnosis Date  . CAD (coronary artery disease)    a. remote CABG.  . Chronic anemia   . Chronic combined systolic and diastolic CHF (congestive heart failure) (HCC)   . Cirrhosis (HCC)   . CKD (chronic kidney disease), stage III   . CVA (cerebral infarction)   . DM (diabetes mellitus), type 2 with peripheral vascular complications (HCC)   . History of hepatitis C 1990s   Hep B immune  . Hypoalbuminemia   . IDDM (insulin dependent diabetes mellitus) (HCC)   . Mitral regurgitation    a. s/p prior ring annuloplasty prosthesis per echo with mod MR by echo 07/2016.  Marland Kitchen PAF (paroxysmal atrial fibrillation) (HCC)    a. not on anticoagulation likely due to h/o IC hemorrhage, prior GIB, cirrhosis. b. suspected prior LAA clipping based on imaging.  . Peripheral neuropathy   . Peripheral vascular disease (HCC)   . Proteinuria    Past Surgical History:  Procedure Laterality Date  . AMPUTATION Right 01/07/2016   Procedure: AMPUTATION ABOVE KNEE;  Surgeon: Larina Earthly, MD;  Location[facility] OR;  Service: Vascular;  Laterality: Right;  . AMPUTATION  Left 04/04/2016   Procedure: LEFT FOURTH AND FIFTH  TOE AMPUTATION;  Surgeon: Larina Earthly, MD;  Location: [facility] OR;  Service: Vascular;  Laterality: Left;  . BELOW KNEE LEG AMPUTATION Right   . CORONARY ARTERY BYPASS GRAFT  2005  . ESOPHAGOGASTRODUODENOSCOPY N/A 05/17/2016   Procedure: ESOPHAGOGASTRODUODENOSCOPY (EGD);  Surgeon: Meryl Dare, MD;  Location[facility] ENDOSCOPY;  Service: Endoscopy;  Laterality: N/A;  . IR GENERIC HISTORICAL  07/07/2016   IR PARACENTESIS 07/07/2016 Gershon Crane, PA-C [facility]-INTERV RAD    . IR THORACENTESIS ASP PLEURAL SPACE W/IMG GUIDE  08/04/2016  . PERIPHERAL VASCULAR CATHETERIZATION N/A 01/11/2016   Procedure: Lower Extremity Angiography;  Surgeon: Nada Libman, MD;  Location: [facility] INVASIVE CV LAB;  Service: Cardiovascular;  Laterality: N/A;  . STERNOTOMY    . TRANSMETATARSAL AMPUTATION Left 10/20/2016   Procedure: LEFT THIRD TOE  AMPUTATION;  Surgeon: Larina Earthly, MD;  Location: [facility] OR;  Service: Vascular;  Laterality: Left;  . TRANSMETATARSAL AMPUTATION Left 11/21/2016   Procedure: LEFT BELOW KNEE AMPUTATION;  Surgeon: Larina Earthly, MD;  Location: [facility] OR;  Service: Vascular;  Laterality: Left;    Allergies  Allergen Reactions  . Hydralazine Other (See Comments)    4/21-4/26/18 pleuritic chest pain with bilateral effusions and pericardial rub. Rule out hydralazine drug-induced lupus   . Percocet [Oxycodone-Acetaminophen] Other (See Comments)    3 episodes of unresponsiveness & apnea; he has received a total of 5 doses of Narcan to date for resuscitation (2 in [facility] ED & 3 @ [facility] SNF) Hx of drug abuse (cocaine, canabis,alcohol, tobacco) Opioids CONTRAINDICATED !!!   . Tramadol Other (See Comments)    Excess sedation; ? Related to pre-existing hepatic dysfunction with PMH Hep C  . Remeron [Mirtazapine]     Outpatient Encounter Prescriptions as of 11/26/2016  Medication Sig  . acetaminophen (TYLENOL) 325 MG tablet Take 650 mg by mouth every 6 (six) hours as needed (for pain or headaches).   . Amino Acids-Protein Hydrolys (FEEDING SUPPLEMENT, PRO-STAT SUGAR FREE 64,) LIQD Take 30 mLs by mouth 2 (two) times daily.  Marland Kitchen aspirin EC 81 MG EC tablet Take 1 tablet (81 mg total) by mouth daily.  Marland Kitchen atorvastatin (LIPITOR) 20 MG tablet Take 20 mg by mouth at bedtime.   . barrier cream (NON-SPECIFIED) CREA Apply 1 application topically See admin instructions. Apply to inner right scrotum twice daily for denuded area r/t scrotal edema  . bisacodyl (DULCOLAX) 10 MG suppository Place  10 mg rectally once as needed (for constipation not relieved by Milk of Magnesia).  . carvedilol (COREG) 6.25 MG tablet Take 6.25 mg by mouth 2 (two) times daily with a meal.  . colchicine 0.6 MG tablet Take 0.6 mg by mouth daily.  . digoxin (LANOXIN) 0.125 MG tablet Take 1 tablet (0.125 mg total) by mouth daily.  . ferrous sulfate 325 (65 FE) MG tablet Take 1 tablet (325 mg total) by mouth daily with breakfast.  . geriatric multivitamins-minerals (ELDERTONIC/GEVRABON) ELIX Take 15 mLs by mouth 2 (two) times daily.  Marland Kitchen ipratropium-albuterol (DUONEB) 0.5-2.5 (3) MG/3ML SOLN Take 3 mLs by nebulization every 6 (six) hours as needed (shortness of breath).  Marland Kitchen levothyroxine (SYNTHROID, LEVOTHROID) 25 MCG tablet Take 25 mcg by mouth daily before breakfast.  . magnesium hydroxide (MILK OF MAGNESIA) 400 MG/5ML suspension Take 30 mLs by mouth once as needed for mild constipation.  . nitroGLYCERIN (NITROSTAT) 0.4 MG SL tablet Place 0.4 mg under the tongue every 5 (five) minutes  x 3 doses as needed for chest pain.   . Nutritional Supplements (ENSURE ENLIVE PO) Take 237 mLs by mouth 3 (three) times daily between meals.  Marland Kitchen oxyCODONE-acetaminophen (PERCOCET/ROXICET) 5-325 MG tablet Take 1 tablet by mouth every 12 (twelve) hours as needed for severe pain.  . OXYGEN Inhale 2 L into the lungs as needed (shortness of breath or O2 sat <90%).   . pantoprazole (PROTONIX) 40 MG tablet Take 1 tablet (40 mg total) by mouth daily.  . polyethylene glycol (MIRALAX / GLYCOLAX) packet Take 17 g by mouth daily as needed for mild constipation.  . sacubitril-valsartan (ENTRESTO) 97-103 MG Take 1 tablet by mouth 2 (two) times daily.  . [DISCONTINUED] mupirocin ointment (BACTROBAN) 2 % Place 1 application into the nose 2 (two) times daily.  . [DISCONTINUED] oxyCODONE-acetaminophen (PERCOCET) 7.5-325 MG tablet Take 1 tablet by mouth every 8 (eight) hours as needed for severe pain.   No facility-administered encounter medications on  file as of 11/26/2016.     Review of Systems  Review of Systems  Constitutional: Negative for activity change, appetite change, chills, diaphoresis, fatigue and fever.  HENT: Negative for mouth sores, postnasal drip, rhinorrhea, sinus pain and sore throat.   Respiratory: Negative for apnea, cough, chest tightness, shortness of breath and wheezing.   Cardiovascular: Negative for chest pain, palpitations and leg swelling.  Gastrointestinal: Negative for abdominal distention, abdominal pain, constipation, diarrhea, nausea and vomiting.  Genitourinary: Negative for dysuria and frequency.  Musculoskeletal: Negative for arthralgias, joint swelling and myalgias.  Skin: Negative for rash.  Neurological: Negative for dizziness, syncope, weakness, light-headedness and numbness.  Psychiatric/Behavioral: Negative for behavioral problems, confusion and sleep disturbance.    ROS   Immunization History  Administered Date(s) Administered  . Influenza-Unspecified 12/14/2014, 12/14/2015  . PPD Test 01/14/2016, 04/09/2016, 08/07/2016  . Pneumococcal Polysaccharide-23 04/05/2016   Pertinent  Health Maintenance Due  Topic Date Due  . FOOT EXAM  10/23/1955  . OPHTHALMOLOGY EXAM  10/23/1955  . COLONOSCOPY  10/23/1997  . INFLUENZA VACCINE  11/12/2016  . URINE MICROALBUMIN  04/14/2028 (Originally 10/23/1955)  . PNA vac Low Risk Adult (2 of 2 - PCV13) 04/05/2017  . HEMOGLOBIN A1C  04/22/2017   Fall Risk  11/06/2016  Falls in the past year? No   Functional Status Survey:    Vitals:   11/26/16 0919  BP: (!) 166/84  Pulse: 72  Resp: 18  Temp: 98.8 F (37.1 C)  TempSrc: Oral  SpO2: 98%  Weight: 139 lb 6.4 oz (63.2 kg)  Height: 5\' 9"  (1.753 m)   Body mass index is 20.59 kg/m. Physical Exam  Constitutional: He is oriented to person, place, and time. He appears well-developed and well-nourished.  HENT:  Head: Normocephalic.  Mouth/Throat: Oropharynx is clear and moist.  Eyes: Pupils are  equal, round, and reactive to light.  Neck: Neck supple.  Cardiovascular: Normal rate and normal heart sounds.   No murmur heard. Pulmonary/Chest: Effort normal. No respiratory distress. He has no wheezes.  Rales on Left lower lobe  Abdominal: Soft. Bowel sounds are normal. He exhibits no distension. There is no tenderness. There is no rebound.  Genitourinary: Testes normal. Circumcised.  Genitourinary Comments: Has 2 Granulomatous Ulcers on his Penile shaft. Not painful. Do have odor in his discharge   Musculoskeletal: He exhibits no edema.  S/P Bilateral BKA. Scar healing well.  Lymphadenopathy:  No Inguinal Lymph nodes  Neurological: He is alert and oriented to person, place, and time.  Answering appropriately. No Focal  deficit.   Psychiatric: His speech is normal. Thought content normal. He is aggressive. Cognition and memory are normal. He expresses impulsivity. He exhibits a depressed mood.    Labs reviewed:  Recent Labs  11/19/16 0217 11/20/16 0536 11/21/16 0401  NA 141 141 139  K 3.5 3.1* 3.1*  CL 95* 95* 98*  CO2 30 37* 34*  GLUCOSE 88 196* 214*  BUN 138* 78* 42*  CREATININE 3.84* 1.85* 1.01  CALCIUM 8.2* 8.6* 8.2*  MG 1.6* 1.8 1.6*  PHOS 3.6 2.0* 1.2*    Recent Labs  10/21/16 0306 10/23/16 0330 11/17/16 1755 11/19/16 0217 11/20/16 0536  AST 13* 16 17  --   --   ALT 10* 10* 10*  --   --   ALKPHOS 134* 152* 186*  --   --   BILITOT 0.7 0.6 1.3*  --   --   PROT 6.2* 6.3* 8.8*  --   --   ALBUMIN 2.0* 1.8* 2.6* 2.0* 2.0*    Recent Labs  08/27/16 0000  10/18/16 0321  11/17/16 1755  11/19/16 0217 11/20/16 0536 11/21/16 0401  WBC 9.6  < > 8.6  < > 19.8*  < > 16.7* 17.0* 14.6*  NEUTROABS 8.2*  --  6.2  --  17.4*  --   --   --   --   HGB 9.7*  < > 13.7  < > 12.5*  < > 10.7* 10.9* 9.2*  HCT 31.3*  < > 43.1  < > 39.4  < > 32.8* 33.9* 29.7*  MCV 85.3  < > 87.1  < > 87.2  < > 84.1 87.6 88.4  PLT 268  < > 202  < > 332  < > 258 199 186  < > = values in  this interval not displayed. Lab Results  Component Value Date   TSH 2.74 07/18/2016   Lab Results  Component Value Date   HGBA1C 7.4 (H) 10/20/2016   Lab Results  Component Value Date   CHOL 88 06/11/2016   HDL 42 06/11/2016   LDLCALC 30 06/11/2016   TRIG 84 06/11/2016    Significant Diagnostic Results in last 30 days:  Ct Chest Wo Contrast  Result Date: 11/19/2016 CLINICAL DATA:  Shortness of breath, weakness, renal failure and gangrene of left foot. EXAM: CT CHEST WITHOUT CONTRAST TECHNIQUE: Multidetector CT imaging of the chest was performed following the standard protocol without IV contrast. COMPARISON:  Chest x-ray on 11/17/2016. Prior chest CT on 08/27/2016 FINDINGS: Cardiovascular: The heart size is normal. Evidence of prior CABG is well as mitral valve repair. No pericardial fluid. The thoracic aorta is normal in caliber. Mediastinum/Nodes: Scattered small nonenlarged mediastinal lymph nodes. The largest measures 10 mm in short axis anterior to the carina. No obvious hilar lymphadenopathy. Lungs/Pleura: Within both posterior lower lobes, there is evidence of mild bronchiectasis with prominent bronchial wall thickening and scarring bilaterally. This appears likely to be mostly chronic. A component of active bronchitis or with patchy pneumonia would be difficult to exclude. There is some associated pleural thickening without pleural fluid. No pulmonary mass lesions or pneumothorax. Upper Abdomen: The liver demonstrates evidence of cirrhosis. There is a trace amount of ascites around the liver. Partially calcified gallstones visualized. Musculoskeletal: No chest wall mass or suspicious bone lesions identified. IMPRESSION: 1. Mild bronchiectasis with prominent bronchial wall thickening and pulmonary scarring in both lower lobes. This appears to most likely be chronic. A component of active bronchitis/pneumonia would be difficult to exclude. 2.  Evidence of cirrhosis with trace ascites around  the liver. Electronically Signed   By: Irish Lack M.D.   On: 11/19/2016 23:28   US Renal  Result Date: 11/19/2016 CLINICAL DATA:  Acute onset of renal insufficiency. Initial encounter. EXAM: RENAL / URINARY TRACT ULTRASOUND COMPLETE COMPARISON:  CT of the abdomen and pelvis performed 12/27/2015 FINDINGS: Right Kidney: Length: 11.6 cm. Echogenicity within normal limits. No mass or hydronephrosis visualized. Left Kidney: Length: 9.1 cm. Echogenicity within normal limits. No mass or hydronephrosis visualized. Bladder: Decompressed, with a Foley catheter in place. A stone is noted within the gallbladder. IMPRESSION: 1. Unremarkable renal ultrasound. 2. Cholelithiasis.  Gallbladder otherwise grossly unremarkable. Electronically Signed   By: Roanna Raider M.D.   On: 11/19/2016 03:33   Dg Chest Port 1 View  Result Date: 11/17/2016 CLINICAL DATA:  Hypotension. EXAM: PORTABLE CHEST 1 VIEW COMPARISON:  Radiograph of Sep 02, 2016. FINDINGS: The heart size and mediastinal contours are within normal limits. Status post cardiac valve repair. No pneumothorax or pleural effusion is noted. Left lung is clear. Nodular density is seen in right lung base. No consolidative process is noted. The visualized skeletal structures are unremarkable. IMPRESSION: Nodular density seen in right lung base which may represent overlying nipple shadow repeat radiograph with nipple markers is recommended to rule out pulmonary nodule. No other acute abnormality is noted. Electronically Signed   By: Lupita Raider, M.D.   On: 11/17/2016 19:36   Dg Foot Complete Left  Result Date: 11/17/2016 CLINICAL DATA:  History of gangrene of left foot and toes and status post recent left third toe amputation on 10/20/2016. Left foot wound. History of diabetes. EXAM: LEFT FOOT - COMPLETE 3+ VIEW COMPARISON:  10/17/2016 FINDINGS: Interval metatarsal amputation of the third toe. AP view shows focal erosion involving the lateral aspect of the second  metatarsal head which appears new. This is concerning for potential osteomyelitis. No fracture identified. Stable extensive vascular calcifications. Stable retained metallic foreign bodies. IMPRESSION: Erosion of the lateral aspect of the left second metatarsal head is concerning for focal osteomyelitis. Electronically Signed   By: Irish Lack M.D.   On: 11/17/2016 19:39    Assessment/Plan  Penile Lesions Swab the drainage Test for RPR, HIV Start on Keflex to empirically treat the lesions. Urology Consult   S/P BKA Healing well Continue with therapy to learn transfers. Patient refusing to work with them Acute renal injury Patient  Refused Blood draw yesterday Will try agin to follow renal function  Chronic CHF His repeat Echo showed improvement in EF He was taken of Aldactone and Demadex in the hospital due to Acute Renal injury and Hyperkalemia Patient refusing weights or any other treatment. Cannot start the medicines or Diuretics Continue Enteresto Diabetes Mellitus Type 2 patient is refusing Accu checks also It seems his Levimir was stopped in the hospital Will cover with Sliding scale till he agrees for further testing.  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

## 2016-11-29 LAB — FECAL OCCULT BLOOD, GUAIAC: Fecal Occult Blood: NEGATIVE

## 2016-12-03 LAB — FECAL OCCULT BLOOD, GUAIAC: FECAL OCCULT BLD: POSITIVE

## 2016-12-04 LAB — FECAL OCCULT BLOOD, GUAIAC: Fecal Occult Blood: POSITIVE

## 2016-12-22 ENCOUNTER — Encounter: Payer: Self-pay | Admitting: Family

## 2016-12-23 ENCOUNTER — Encounter: Payer: Self-pay | Admitting: Family

## 2016-12-23 ENCOUNTER — Ambulatory Visit (INDEPENDENT_AMBULATORY_CARE_PROVIDER_SITE_OTHER): Payer: Self-pay | Admitting: Family

## 2016-12-23 VITALS — BP 169/96 | HR 86 | Temp 98.8°F | Resp 20 | Ht 69.0 in | Wt 139.0 lb

## 2016-12-23 DIAGNOSIS — I739 Peripheral vascular disease, unspecified: Secondary | ICD-10-CM

## 2016-12-23 DIAGNOSIS — I96 Gangrene, not elsewhere classified: Secondary | ICD-10-CM

## 2016-12-23 DIAGNOSIS — Z89511 Acquired absence of right leg below knee: Secondary | ICD-10-CM

## 2016-12-23 DIAGNOSIS — Z89512 Acquired absence of left leg below knee: Secondary | ICD-10-CM

## 2016-12-23 NOTE — Patient Instructions (Signed)
Peripheral Vascular Disease Peripheral vascular disease (PVD) is a disease of the blood vessels that are not part of your heart and brain. A simple term for PVD is poor circulation. In most cases, PVD narrows the blood vessels that carry blood from your heart to the rest of your body. This can result in a decreased supply of blood to your arms, legs, and internal organs, like your stomach or kidneys. However, it most often affects a person's lower legs and feet. There are two types of PVD.  Organic PVD. This is the more common type. It is caused by damage to the structure of blood vessels.  Functional PVD. This is caused by conditions that make blood vessels contract and tighten (spasm).  Without treatment, PVD tends to get worse over time. PVD can also lead to acute ischemic limb. This is when an arm or limb suddenly has trouble getting enough blood. This is a medical emergency. Follow these instructions at home:  Take medicines only as told by your doctor.  Do not use any tobacco products, including cigarettes, chewing tobacco, or electronic cigarettes. If you need help quitting, ask your doctor.  Lose weight if you are overweight, and maintain a healthy weight as told by your doctor.  Eat a diet that is low in fat and cholesterol. If you need help, ask your doctor.  Exercise regularly. Ask your doctor for some good activities for you.  Take good care of your feet. ? Wear comfortable shoes that fit well. ? Check your feet often for any cuts or sores. Contact a doctor if:  You have cramps in your legs while walking.  You have leg pain when you are at rest.  You have coldness in a leg or foot.  Your skin changes.  You are unable to get or have an erection (erectile dysfunction).  You have cuts or sores on your feet that are not healing. Get help right away if:  Your arm or leg turns cold and blue.  Your arms or legs become red, warm, swollen, painful, or numb.  You have  chest pain or trouble breathing.  You suddenly have weakness in your face, arm, or leg.  You become very confused or you cannot speak.  You suddenly have a very bad headache.  You suddenly cannot see. This information is not intended to replace advice given to you by your health care provider. Make sure you discuss any questions you have with your health care provider. Document Released: 06/25/2009 Document Revised: 09/06/2015 Document Reviewed: 09/08/2013 Elsevier Interactive Patient Education  2017 Elsevier Inc.    Living With an Amputation The most common causes of amputation from the hip down include:  Diseases that: ? Reduce the blood flow to an area of your body. ? Decrease your body's ability to fight infection.  Traumatic injuries that cause significant damage to body tissues.  Birth defects.  Cancerous lumps (malignant tumors). Amputation above the hip is usually the result of trauma or birth defect. Disease is a less common cause. Living with an amputation can be challenging, but you can still live a long, productive life. WHAT ARE THE COMMON CHALLENGES OF LIVING WITH AN AMPUTATION? The most common challenges are mobility and self-care. With some new habits, though, it is often possible to do all of the activities you used to do. You may be able to use a device that substitutes for your limb (prosthesis). The prosthesis helps you adapt more quickly to these challenges. Your health care  provider can help select a prosthesis to meet your needs. A person who helps you choose and fits you with a prosthesis (prosthetist) may also help. A rehabilitation program can also help you gain mobility and self-reliance. Your rehabilitation team may include:  Physicians.  Physical and occupational therapists.  Prosthetists.  Nurses.  Social workers.  Psychologists.  Dietitians. Your rehabilitation team will help you with all aspects of recovery and returning to work, home,  sports, and your community. You will learn to:  Get around safely.  Adjust your home.  Exercise.  Use a prosthetic.  Work through Surveyor, quantity.  Connect with other people who have gone through the same experience. Additional challenges may include:  Grieving period.  Body image issues.  Lifestyle issues, such as sex.  Maintaining a healthy weight. These issues are normal. Discuss these with your rehabilitation team. WHEN CAN I RETURN TO MY REGULAR ACTIVITIES?  Returning to your normal activities is part of healing. Changes can often be made to equipment that allow you to return to a sport or hobby. Some Arboriculturist for this. Discuss all of your leisure interests with your health care provider and prosthetist. WHEN CAN I RETURN TO WORK? When you are ready to return to work, your therapists can perform job site evaluations and make recommendations to help you perform your job. You may not be able to return to your same job. Your local Office of Vocational Rehabilitation can assist you in job retraining. FOR MORE INFORMATION: Visit these online resources. You can find tips on everything from getting dressed and using bathrooms to driving and travel considerations. These resources can also connect you to a network of emotional support, activities, and innovations. You may also search the Internet to find a local support group.  Amputee Coalition: http://www.amputee-coalition.org/ensuring-fall-safety/  Amputee Support Groups: http://amputee.supportgroups.com  Daily Strength: http://melendez.com/  [employer]: http://www.nationalamputation.[facility] on Health, Physical Activity and Disability: http://www.nchpad.org  Disabled Sports [place]: http://www.disabledsportsusa.org  American Academy of Orthotists and Prosthetists: http://www.oandp.org This information is not intended to replace advice  given to you by your health care provider. Make sure you discuss any questions you have with your health care provider. Document Released: 12/21/2001 Document Revised: 04/21/2014 Document Reviewed: 08/16/2013 Elsevier Interactive Patient Education  2017 [employer].

## 2016-12-23 NOTE — Progress Notes (Signed)
Postoperative Visit   History of Present Illness  Ronald Simpson is a 71 y.o. 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 Arbie Cookey for left foot ischemia with gangrene at the metatarsal head on the second toe and dry gangrene of the great toe.  He also has a hx of right BKA.  He is a resident of [facility] facility.   Pt returns by ambulance transport/stretcher for staples removal and incision check.   Pt denies fever or chills. He does not report any pain.  He does have a moist cough. He requested something for his cough. I advised him to ask the nursing facility personnel to contact his PCP to help him with this.   The patient's wounds are healed.    For VQI Use Only  PRE-ADM LIVING: Nursing home  AMB STATUS: pt is on a stretcher   Past Medical History:  Diagnosis Date  . CAD (coronary artery disease)    a. remote CABG.  . Chronic anemia   . Chronic combined systolic and diastolic CHF (congestive heart failure) (HCC)   . Cirrhosis (HCC)   . CKD (chronic kidney disease), stage III   . CVA (cerebral infarction)   . DM (diabetes mellitus), type 2 with peripheral vascular complications (HCC)   . History of hepatitis C 1990s   Hep B immune  . Hypoalbuminemia   . IDDM (insulin dependent diabetes mellitus) (HCC)   . Mitral regurgitation    a. s/p prior ring annuloplasty prosthesis per echo with mod MR by echo 07/2016.  Marland Kitchen PAF (paroxysmal atrial fibrillation) (HCC)    a. not on anticoagulation likely due to h/o IC hemorrhage, prior GIB, cirrhosis. b. suspected prior LAA clipping based on imaging.  . Peripheral neuropathy   . Peripheral vascular disease (HCC)   . Proteinuria     Past Surgical History:  Procedure Laterality Date  . AMPUTATION Right 01/07/2016   Procedure: AMPUTATION ABOVE KNEE;  Surgeon: Larina Earthly, MD;  Location: [facility] OR;  Service: Vascular;  Laterality: Right;  . AMPUTATION Left 04/04/2016   Procedure: LEFT FOURTH AND FIFTH  TOE AMPUTATION;  Surgeon:  Larina Earthly, MD;  Location: [facility] OR;  Service: Vascular;  Laterality: Left;  . BELOW KNEE LEG AMPUTATION Right   . CORONARY ARTERY BYPASS GRAFT  2005  . ESOPHAGOGASTRODUODENOSCOPY N/A 05/17/2016   Procedure: ESOPHAGOGASTRODUODENOSCOPY (EGD);  Surgeon: Meryl Dare, MD;  Location: [facility] ENDOSCOPY;  Service: Endoscopy;  Laterality: N/A;  . IR GENERIC HISTORICAL  07/07/2016   IR PARACENTESIS 07/07/2016 Gershon Crane, PA-C [facility]-INTERV RAD  . IR THORACENTESIS ASP PLEURAL SPACE W/IMG GUIDE  08/04/2016  . PERIPHERAL VASCULAR CATHETERIZATION N/A 01/11/2016   Procedure: Lower Extremity Angiography;  Surgeon: Nada Libman, MD;  Location: [facility] INVASIVE CV LAB;  Service: Cardiovascular;  Laterality: N/A;  . STERNOTOMY    . TRANSMETATARSAL AMPUTATION Left 10/20/2016   Procedure: LEFT THIRD TOE  AMPUTATION;  Surgeon: Larina Earthly, MD;  Location: [facility] OR;  Service: Vascular;  Laterality: Left;  . TRANSMETATARSAL AMPUTATION Left 11/21/2016   Procedure: LEFT BELOW KNEE AMPUTATION;  Surgeon: Larina Earthly, MD;  Location: [facility] OR;  Service: Vascular;  Laterality: Left;    Social History   Social History  . Marital status: Widowed    Spouse name: N/A  . Number of children: N/A  . Years of education: N/A   Occupational History  . Not on file.   Social History Main Topics  . Smoking  status: Former Smoker    Packs/day: 0.10    Years: 54.00    Types: Cigarettes    Quit date: 04/25/2016  . Smokeless tobacco: Never Used  . Alcohol use Yes     Comment: 04/02/2016 "aint suppose to drink in the nursing home"  . Drug use: Yes    Types: Cocaine, Marijuana     Comment: marijuana use daily, rare cocain use.  Previously did snort and inject drugs through 2017. Hasn't had access to street drugs from 10/201 7 through February/201 8  . Sexual activity: Not Currently   Other Topics Concern  . Not on file   Social History Narrative   As of fall/2017 patient had been living in [place] but then entered SNF in  [place]. His wife passed away in summer 2017.    Allergies  Allergen Reactions  . Hydralazine Other (See Comments)    4/21-4/26/18 pleuritic chest pain with bilateral effusions and pericardial rub. Rule out hydralazine drug-induced lupus   . Percocet [Oxycodone-Acetaminophen] Other (See Comments)    3 episodes of unresponsiveness & apnea; he has received a total of 5 doses of Narcan to date for resuscitation (2 in [facility] & 3 @ [facility]) Hx of drug abuse (cocaine, canabis,alcohol, tobacco) Opioids CONTRAINDICATED !!!   . Tramadol Other (See Comments)    Excess sedation; ? Related to pre-existing hepatic dysfunction with PMH Hep C  . Remeron [Mirtazapine]     Current Outpatient Prescriptions on File Prior to Visit  Medication Sig Dispense Refill  . acetaminophen (TYLENOL) 325 MG tablet Take 650 mg by mouth every 6 (six) hours as needed (for pain or headaches).     . Amino Acids-Protein Hydrolys (FEEDING SUPPLEMENT, PRO-STAT SUGAR FREE 64,) LIQD Take 30 mLs by mouth 2 (two) times daily. 900 mL 0  . aspirin EC 81 MG EC tablet Take 1 tablet (81 mg total) by mouth daily. 30 tablet 1  . atorvastatin (LIPITOR) 20 MG tablet Take 20 mg by mouth at bedtime.     . barrier cream (NON-SPECIFIED) CREA Apply 1 application topically See admin instructions. Apply to inner right scrotum twice daily for denuded area r/t scrotal edema    . bisacodyl (DULCOLAX) 10 MG suppository Place 10 mg rectally once as needed (for constipation not relieved by Milk of Magnesia).    . carvedilol (COREG) 6.25 MG tablet Take 6.25 mg by mouth 2 (two) times daily with a meal.    . colchicine 0.6 MG tablet Take 0.6 mg by mouth daily.    . digoxin (LANOXIN) 0.125 MG tablet Take 1 tablet (0.125 mg total) by mouth daily.    . ferrous sulfate 325 (65 FE) MG tablet Take 1 tablet (325 mg total) by mouth daily with breakfast.  3  . geriatric multivitamins-minerals (ELDERTONIC/GEVRABON) ELIX Take 15 mLs by mouth 2 (two) times  daily.    Marland Kitchen. ipratropium-albuterol (DUONEB) 0.5-2.5 (3) MG/3ML SOLN Take 3 mLs by nebulization every 6 (six) hours as needed (shortness of breath).    Marland Kitchen. levothyroxine (SYNTHROID, LEVOTHROID) 25 MCG tablet Take 25 mcg by mouth daily before breakfast.    . magnesium hydroxide (MILK OF MAGNESIA) 400 MG/5ML suspension Take 30 mLs by mouth once as needed for mild constipation.    . nitroGLYCERIN (NITROSTAT) 0.4 MG SL tablet Place 0.4 mg under the tongue every 5 (five) minutes x 3 doses as needed for chest pain.     . Nutritional Supplements (ENSURE ENLIVE PO) Take 237 mLs by mouth 3 (  three) times daily between meals.    Marland Kitchen oxyCODONE-acetaminophen (PERCOCET/ROXICET) 5-325 MG tablet Take 1 tablet by mouth every 12 (twelve) hours as needed for severe pain.    . OXYGEN Inhale 2 L into the lungs as needed (shortness of breath or O2 sat <90%).     . pantoprazole (PROTONIX) 40 MG tablet Take 1 tablet (40 mg total) by mouth daily. 30 tablet 3  . polyethylene glycol (MIRALAX / GLYCOLAX) packet Take 17 g by mouth daily as needed for mild constipation. 14 each 0  . sacubitril-valsartan (ENTRESTO) 97-103 MG Take 1 tablet by mouth 2 (two) times daily. 60 tablet 1   No current facility-administered medications on file prior to visit.     Physical Examination  Vitals:   12/23/16 0929 12/23/16 0932  BP: (!) 176/94 (!) 169/96  Pulse: 86   Resp: 20   Temp: 98.8 F (37.1 C)   TempSrc: Oral   SpO2: 100%   Weight: 139 lb (63 kg)   Height:  (1.753 m)    Body mass index is 20.53 kg/m.  Left BKA    Left BKA incision is well healed.  Staples are intact. No signs of infection.   Medical Decision Making  Ronald Simpson is a 71 y.o. male who presents s/p left below-the-knee amputation on 11-21-16.  All staples removed and steri strips applied.   The patient's stump is healing appropriately with resolution of pre-operative symptoms.  He states he has a right BKA prosthesis and does not use it. He  states he does not want a left BKA prosthesis at this time, but if he changes his mind he will let someone at the nursing facility know to contact us.    The patient can follow up with [place] as needed.  Braylee Lal, Carma Lair, RN, MSN, FNP-C [facility] Office: [phone]  12/23/2016, 9:52 AM  Clinic MD: Early

## 2016-12-26 ENCOUNTER — Encounter: Payer: Self-pay | Admitting: Adult Health

## 2016-12-26 ENCOUNTER — Non-Acute Institutional Stay (SKILLED_NURSING_FACILITY): Payer: Medicare Other | Admitting: Adult Health

## 2016-12-26 DIAGNOSIS — N489 Disorder of penis, unspecified: Secondary | ICD-10-CM

## 2016-12-26 DIAGNOSIS — I5022 Chronic systolic (congestive) heart failure: Secondary | ICD-10-CM

## 2016-12-26 DIAGNOSIS — J189 Pneumonia, unspecified organism: Secondary | ICD-10-CM | POA: Diagnosis not present

## 2016-12-26 DIAGNOSIS — D638 Anemia in other chronic diseases classified elsewhere: Secondary | ICD-10-CM | POA: Diagnosis not present

## 2016-12-26 DIAGNOSIS — I10 Essential (primary) hypertension: Secondary | ICD-10-CM | POA: Diagnosis not present

## 2016-12-26 DIAGNOSIS — I480 Paroxysmal atrial fibrillation: Secondary | ICD-10-CM | POA: Diagnosis not present

## 2016-12-26 NOTE — Progress Notes (Signed)
DATE:  12/26/2016   MRN:  [id number]  BIRTHDAY: [birth year]  Facility:  Nursing Home Location:  [facility] Room Number: 113-A  LEVEL OF CARE:  SNF (31)  Contact Information    Name [place] Niece [phone]  [phone]   Lasalle,Clementine Mother [phone]     Asael, Pann Daughter   [phone]       Code Status History    Date Active Date Inactive Code Status Order ID Comments User Context   11/17/2016  9:57 PM 11/23/2016  5:34 PM Full Code [id number]  Omar Person, NP ED   10/17/2016  7:29 PM 10/23/2016  7:44 PM Full Code [id number]  Vianne Bulls, MD ED   08/27/2016  2:20 AM 09/06/2016  5:35 PM Full Code [id number]  Germain Osgood, PA-C ED   08/02/2016  8:29 PM 08/07/2016  6:48 PM Full Code [id number]  Lily Kocher, MD ED   05/15/2016  5:59 PM 05/21/2016  9:41 PM Full Code [id number]  Velvet Bathe, MD Inpatient   04/02/2016  3:19 PM 04/08/2016  8:26 PM Full Code [id number]  Dellia Nims, MD ED   12/27/2015  1:27 AM 01/14/2016  5:17 PM Full Code [id number]  Danford, Suann Larry, MD Inpatient       Chief Complaint  Patient presents with  . Medical Management of Chronic Issues    Routine visit    HISTORY OF PRESENT ILLNESS:  This is a 54-YO male seen for a routine visit.  He is a long-term care resident of [facility].  He has a PMH of AKI, severe hyperkalemia, PVD with gangrene, chronic CHF with systolic dysfunction, acquired hypothyroidism, uncontrolled type 2 diabetes mellitus with complication, cirrhosis, and hx of CVA.  He was seen today in his room. He was noted to have productive cough with yellowish to whitish phlegm. No reported fever. Breath sounds are clear. Chest x-ray showed bibasilar pneumonia. He was seen yesterday by the urologist due to lesion on his glans. He was restarted on Keflex X 7 days Urologist wanted to do biopsy but wants to have clearance due to complex cardiac  issues.      PAST MEDICAL HISTORY:  Past Medical History:  Diagnosis Date  . CAD (coronary artery disease)    a. remote CABG.  . Chronic anemia   . Chronic combined systolic and diastolic CHF (congestive heart failure) ([place])   . Cirrhosis ([place])   . CKD (chronic kidney disease), stage III   . CVA (cerebral infarction)   . DM (diabetes mellitus), type 2 with peripheral vascular complications ([place])   . History of hepatitis C 1990s   Hep B immune  . Hypoalbuminemia   . IDDM (insulin dependent diabetes mellitus) ([place])   . Mitral regurgitation    a. s/p prior ring annuloplasty prosthesis per echo with mod MR by echo 07/2016.  Marland Kitchen PAF (paroxysmal atrial fibrillation) (HCC)    a. not on anticoagulation likely due to h/o IC hemorrhage, prior GIB, cirrhosis. b. suspected prior LAA clipping based on imaging.  . Peripheral neuropathy   . Peripheral vascular disease ([place])   . Proteinuria      CURRENT MEDICATIONS: Reviewed  Patient's Medications  New Prescriptions   No medications on file  Previous Medications   ACETAMINOPHEN (TYLENOL) 325 MG TABLET    Take 650 mg by mouth every 6 (six) hours as needed (for pain or headaches).    AMINO ACIDS-PROTEIN HYDROLYS (FEEDING  SUPPLEMENT, PRO-STAT SUGAR FREE 64,) LIQD    Take 30 mLs by mouth 2 (two) times daily.   AMLODIPINE (NORVASC) 2.5 MG TABLET    Take 2.5 mg by mouth daily.   ASPIRIN EC 81 MG EC TABLET    Take 1 tablet (81 mg total) by mouth daily.   ATORVASTATIN (LIPITOR) 20 MG TABLET    Take 20 mg by mouth at bedtime.    BARRIER CREAM (NON-SPECIFIED) CREA    Apply 1 application topically See admin instructions. Apply to inner right scrotum twice daily for denuded area r/t scrotal edema   BISACODYL (DULCOLAX) 10 MG SUPPOSITORY    Place 10 mg rectally once as needed (for constipation not relieved by Milk of Magnesia).   CARVEDILOL (COREG) 6.25 MG TABLET    Take 6.25 mg by mouth 2 (two) times daily with a meal.   CEPHALEXIN (KEFLEX) 250 MG CAPSULE     Take 250 mg by mouth 3 (three) times daily.   COLCHICINE 0.6 MG TABLET    Take 0.6 mg by mouth daily.   DIGOXIN (LANOXIN) 0.125 MG TABLET    Take 1 tablet (0.125 mg total) by mouth daily.   FERROUS SULFATE 325 (65 FE) MG TABLET    Take 325 mg by mouth 2 (two) times daily with a meal.   GERIATRIC MULTIVITAMINS-MINERALS (ELDERTONIC/GEVRABON) ELIX    Take 15 mLs by mouth 2 (two) times daily.   IPRATROPIUM-ALBUTEROL (DUONEB) 0.5-2.5 (3) MG/3ML SOLN    Take 3 mLs by nebulization every 6 (six) hours as needed (shortness of breath).   KETOCONAZOLE (NIZORAL) 2 % CREAM    Apply 1 application topically daily. Apply topically to lesions on penis BID   LEVOTHYROXINE (SYNTHROID, LEVOTHROID) 25 MCG TABLET    Take 25 mcg by mouth daily before breakfast.   MAGNESIUM HYDROXIDE (MILK OF MAGNESIA) 400 MG/5ML SUSPENSION    Take 30 mLs by mouth once as needed for mild constipation.   NITROGLYCERIN (NITROSTAT) 0.4 MG SL TABLET    Place 0.4 mg under the tongue every 5 (five) minutes x 3 doses as needed for chest pain.    NUTRITIONAL SUPPLEMENTS (ENSURE ENLIVE PO)    Take 237 mLs by mouth 3 (three) times daily between meals.   ONDANSETRON (ZOFRAN) 4 MG TABLET    Take 4 mg by mouth every 6 (six) hours as needed for nausea or vomiting.   OXYCODONE-ACETAMINOPHEN (PERCOCET/ROXICET) 5-325 MG TABLET    Take 1 tablet by mouth every 12 (twelve) hours as needed for severe pain.   OXYGEN    Inhale 2 L into the lungs as needed (shortness of breath or O2 sat <90%).    PANTOPRAZOLE (PROTONIX) 40 MG TABLET    Take 1 tablet (40 mg total) by mouth daily.   POLYETHYLENE GLYCOL (MIRALAX / GLYCOLAX) PACKET    Take 17 g by mouth daily as needed for mild constipation.   SACUBITRIL-VALSARTAN (ENTRESTO) 97-103 MG    Take 1 tablet by mouth 2 (two) times daily.   SERTRALINE (ZOLOFT) 25 MG TABLET    Take 25 mg by mouth daily.  Modified Medications   No medications on file  Discontinued Medications   FERROUS SULFATE 325 (65 FE) MG TABLET     Take 1 tablet (325 mg total) by mouth daily with breakfast.     Allergies  Allergen Reactions  . Hydralazine Other (See Comments)    4/21-4/26/18 pleuritic chest pain with bilateral effusions and pericardial rub. Rule out hydralazine drug-induced lupus   .  Percocet [Oxycodone-Acetaminophen] Other (See Comments)    3 episodes of unresponsiveness & apnea; he has received a total of 5 doses of Narcan to date for resuscitation (2 in [facility] ED & 3 @ [facility] SNF) Hx of drug abuse (cocaine, canabis,alcohol, tobacco) Opioids CONTRAINDICATED !!!   . Tramadol Other (See Comments)    Excess sedation; ? Related to pre-existing hepatic dysfunction with PMH Hep C  . Remeron [Mirtazapine]     REVIEW OF SYSTEMS:  GENERAL: no change in appetite, no fatigue, no weight changes, no fever, chills or weakness MOUTH and THROAT: Denies oral discomfort, gingival pain or bleeding, pain from teeth or hoarseness   RESPIRATORY: no SOB, DOE, wheezing, hemoptysis, +cough CARDIAC: no chest pain, edema or palpitations GI: no abdominal pain, diarrhea, constipation, heart burn, nausea or vomiting GU: Denies dysuria, frequency, hematuria, incontinence, or discharge PSYCHIATRIC: Denies feeling of depression or anxiety. No report of hallucinations, insomnia, paranoia, or agitation    PHYSICAL EXAMINATION  GENERAL APPEARANCE: Well nourished. In no acute distress. Normal body habitus SKIN:  Has lesion on glans penis MOUTH and THROAT: Lips are without lesions. Oral mucosa is moist and without lesions. Tongue is normal in shape, size, and color and without lesions RESPIRATORY: breathing is even & unlabored, BS CTAB CARDIAC: Irregular hear rate, no murmur,no extra heart sounds, no edema GI: abdomen soft, normal BS, no masses, no tenderness, no hepatomegaly, no splenomegaly EXTREMITIES:  Has old right BKA, Left BKA stmp with dry steri-strips intact, no erythema  PSYCHIATRIC: Alert and oriented X 3. Affect and behavior are  appropriate    LABS/RADIOLOGY: Labs reviewed: 11/29/16   Stool occult blood - negative 11/28/16  glucose 139 calcium 7.7 creatinine 0.96 BUN 21.8 sodium 136  K4.2  eGFR 79 11/27/16  WBC 8.6 hemoglobin 7.8 hematocrit 24.4 platelet 734 Basic Metabolic Panel:  Recent Labs  11/19/16 0217 11/20/16 0536 11/21/16 0401  NA 141 141 139  K 3.5 3.1* 3.1*  CL 95* 95* 98*  CO2 30 37* 34*  GLUCOSE 88 196* 214*  BUN 138* 78* 42*  CREATININE 3.84* 1.85* 1.01  CALCIUM 8.2* 8.6* 8.2*  MG 1.6* 1.8 1.6*  PHOS 3.6 2.0* 1.2*   Liver Function Tests:  Recent Labs  10/21/16 0306 10/23/16 0330 11/17/16 1755 11/19/16 0217 11/20/16 0536  AST 13* 16 17  --   --   ALT 10* 10* 10*  --   --   ALKPHOS 134* 152* 186*  --   --   BILITOT 0.7 0.6 1.3*  --   --   PROT 6.2* 6.3* 8.8*  --   --   ALBUMIN 2.0* 1.8* 2.6* 2.0* 2.0*    Recent Labs  04/02/16 1121 05/18/16 1111 11/18/16 0012  AMMONIA 53* 31 42*   CBC:  Recent Labs  08/27/16 0000  10/18/16 0321  11/17/16 1755  11/19/16 0217 11/20/16 0536 11/21/16 0401  WBC 9.6  < > 8.6  < > 19.8*  < > 16.7* 17.0* 14.6*  NEUTROABS 8.2*  --  6.2  --  17.4*  --   --   --   --   HGB 9.7*  < > 13.7  < > 12.5*  < > 10.7* 10.9* 9.2*  HCT 31.3*  < > 43.1  < > 39.4  < > 32.8* 33.9* 29.7*  MCV 85.3  < > 87.1  < > 87.2  < > 84.1 87.6 88.4  PLT 268  < > 202  < > 332  < > 258  199 186  < > = values in this interval not displayed. Lipid Panel:  Recent Labs  06/09/16 06/11/16  HDL 38 42   Cardiac Enzymes:  Recent Labs  08/27/16 0016 08/27/16 0411 08/27/16 1120  TROPONINI 0.03* 0.07* 0.06*   CBG:  Recent Labs  11/23/16 0052 11/23/16 0538 11/23/16 0809  GLUCAP 118* 134* 136*      ASSESSMENT/PLAN:  1. HCAP (healthcare-associated pneumonia) - chest x-ray showed bibasilar infiltrates, he was recently re-started on Keflex 250 mg TID for penile lesion, will start on Mucinex 600 mg 1 tab BID X 1 week   2. Chronic systolic CHF (congestive  heart failure) (HCC) - no SOB, continue Entresto 97-103 mg 1 tab BID, digoxin 125 g 1 tab daily and carvedilol 6.25 mg 1 tablet twice a day   3. PAF (paroxysmal atrial fibrillation) (HCC) - rate controlled, continue carvedilol 6.25 mg 1 tab twice a day, digoxin 125 g 1 tab daily, aspirin EC 81 mg daily   4. Penile lesion - follows up with urology who wants to have patient to get clearance from cardiology before he goes for debridement and biopsy of his penile lesion, was recently started on Keflex 250 mg TID X 7 days   5. Anemia of chronic disease - hgb 7.8, continue ferrous sulfate 325 mg 1 tab twice a day, check CBC   6. Benign essential HTN - well-controlled; continue amlodipine besylate 2.5 mg 1 tab daily, carvedilol 6.25 mg 1 tab twice a day    Goals of care:  Long-term care    Monina C. [place] - NP    [employer] [phone]

## 2016-12-27 LAB — CBC AND DIFFERENTIAL
HCT: 27 — AB (ref 41–53)
Hemoglobin: 8.3 — AB (ref 13.5–17.5)
NEUTROS ABS: 4
PLATELETS: 165 (ref 150–399)
WBC: 5.8

## 2017-01-01 ENCOUNTER — Encounter: Payer: Self-pay | Admitting: Internal Medicine

## 2017-01-01 DIAGNOSIS — N489 Disorder of penis, unspecified: Secondary | ICD-10-CM | POA: Insufficient documentation

## 2017-01-06 ENCOUNTER — Telehealth: Payer: Self-pay | Admitting: Physician Assistant

## 2017-01-06 NOTE — Telephone Encounter (Signed)
Called Dr. Alphonsa Overall office back.  Left a detailed message to fax [place] the cardiac clearance over so we can have it for documentation.

## 2017-01-06 NOTE — Telephone Encounter (Signed)
New message   Fax after his appt with Herma Carson     [facility] Pre-operative Risk Assessment    Request for surgical clearance:  What type of surgery is being performed? Penal debreavement and biopsy 1. When is this surgery scheduled?  Pending clearance   2. Are there any medications that need to be held prior to surgery and how long? Aspirin -5 days prior  3. Name of physician performing surgery?  Dr Salena Saner Liliane Shi  4. What is your office phone and fax number? ofc [phone] fax [phone]  Berle Mull 01/06/2017, 2:07 PM  _________________________________________________________________   (provider comments below)

## 2017-01-19 ENCOUNTER — Ambulatory Visit (INDEPENDENT_AMBULATORY_CARE_PROVIDER_SITE_OTHER): Payer: Medicare Other | Admitting: Physician Assistant

## 2017-01-19 ENCOUNTER — Encounter: Payer: Self-pay | Admitting: Physician Assistant

## 2017-01-19 VITALS — BP 160/98 | HR 73 | Ht 69.0 in | Wt 139.0 lb

## 2017-01-19 DIAGNOSIS — I5043 Acute on chronic combined systolic (congestive) and diastolic (congestive) heart failure: Secondary | ICD-10-CM

## 2017-01-19 DIAGNOSIS — I480 Paroxysmal atrial fibrillation: Secondary | ICD-10-CM | POA: Diagnosis not present

## 2017-01-19 DIAGNOSIS — Z01818 Encounter for other preprocedural examination: Secondary | ICD-10-CM

## 2017-01-19 DIAGNOSIS — I2510 Atherosclerotic heart disease of native coronary artery without angina pectoris: Secondary | ICD-10-CM | POA: Diagnosis not present

## 2017-01-19 DIAGNOSIS — I10 Essential (primary) hypertension: Secondary | ICD-10-CM | POA: Diagnosis not present

## 2017-01-19 LAB — BASIC METABOLIC PANEL
BUN / CREAT RATIO: 8 — AB (ref 10–24)
BUN: 6 mg/dL — AB (ref 8–27)
CHLORIDE: 103 mmol/L (ref 96–106)
CO2: 23 mmol/L (ref 20–29)
Calcium: 8.4 mg/dL — ABNORMAL LOW (ref 8.6–10.2)
Creatinine, Ser: 0.75 mg/dL — ABNORMAL LOW (ref 0.76–1.27)
GFR calc non Af Amer: 94 mL/min/{1.73_m2} (ref >59)
GFR, EST AFRICAN AMERICAN: 108 mL/min/{1.73_m2} (ref >59)
GLUCOSE: 210 mg/dL — AB (ref 65–99)
POTASSIUM: 4.1 mmol/L (ref 3.5–5.2)
SODIUM: 141 mmol/L (ref 134–144)

## 2017-01-19 MED ORDER — TORSEMIDE 20 MG PO TABS: 60.0000 mg | ORAL_TABLET | Freq: Every day | ORAL | 3 refills | Status: AC

## 2017-01-19 NOTE — Patient Instructions (Addendum)
Your physician has recommended you make the following change in your medication:  1) restart Torsemide (DEMADEX) 60 mg once a day  Your physician recommends that you return for lab work today (BMET) and in 2 weeks (BMET)  Your physician recommends that you schedule a follow-up appointment with the [facility] for follow up and for cardiac clearance.  See below.  Addendum: surgical clearance form interoffice mailed to [facility].

## 2017-01-19 NOTE — Progress Notes (Signed)
Cardiology Office Note    Date:  01/19/2017   ID:  Ronald Simpson, DOB 1945-08-31, MRN [id number]  PCP:  Hendricks Limes, MD  Cardiologist: Dr. Meda Coffee CHF clinic:  No chief complaint on file.   History of Present Illness:  Ronald Simpson is a 71 y.o. male with history of ischemic cardiomyopathy, combined systolic and diastolic CHF EF 54-00%, CAD with remote CABG, mitral regurgitation status post prior annular ring prosthesis, CVA, DM, hepatitis C/EtOH cirrhosis, right lower extremity amputation, PAF not anticoagulated due to history of IC hemorrhage, prior GI bleed, cirrhosis, PVD.  Hospitalized 07/2016 with bilateral pleural effusions and pericardial rub improved with thoracentesis and colchicine. Echo showed EF of 35-40% with diffuse hypokinesis worse in the inferior and inferolateral walls, diastolic and systolic flattening of the ventricular septum, moderate mitral regurgitation.  Admitted 08/2016 with hypoglycemia and pulmonary edema. He was intubated underwent another thoracentesis EF 35-40%.   Patient was last seen in the heart failure clinic 09/17/16 and weight was 158 pounds. He was living in a nursing home.  Patient was admitted to the hospital 11/2016 with weakness and hypotension systolic blood pressures 60 to 70s potassium greater than 7.5 and creatinine 8.48. He had dry gangrene of his left 2 toes. He had been taking Aldactone, Entresto and Demadex. Aldactone and Demadex were held and  Crt 1.01 at discharge. 2-D echo 11/18/16 LVEF 65-70%. 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 Patient refused blood work the last 2 days of the hospitalization.  Patient comes in today from nursing home for clearance for biopsy of penile lesions and possible circumcision. He complains of cough and shortness of breath for over a week now. He's refused being weighed at the nursing home. Past Medical History:  Diagnosis Date  . CAD (coronary artery disease)    a. remote CABG.  . Chronic anemia   . Chronic  combined systolic and diastolic CHF (congestive heart failure) ([place])   . Cirrhosis ([place])   . CKD (chronic kidney disease), stage III ([place])   . CVA (cerebral infarction)   . DM (diabetes mellitus), type 2 with peripheral vascular complications ([place])   . History of hepatitis C 1990s   Hep B immune  . Hypoalbuminemia   . IDDM (insulin dependent diabetes mellitus) ([place])   . Mitral regurgitation    a. s/p prior ring annuloplasty prosthesis per echo with mod MR by echo 07/2016.  Marland Kitchen PAF (paroxysmal atrial fibrillation) ([facility])    a. not on anticoagulation likely due to h/o IC hemorrhage, prior GIB, cirrhosis. b. suspected prior LAA clipping based on imaging.  . Peripheral neuropathy   . Peripheral vascular disease ([place])   . Proteinuria     Past Surgical History:  Procedure Laterality Date  . AMPUTATION Right 01/07/2016   Procedure: AMPUTATION ABOVE KNEE;  Surgeon: Rosetta Posner, MD;  Location: [facility];  Service: Vascular;  Laterality: Right;  . AMPUTATION Left 04/04/2016   Procedure: LEFT FOURTH AND FIFTH  TOE AMPUTATION;  Surgeon: Rosetta Posner, MD;  Location: [facility];  Service: Vascular;  Laterality: Left;  . BELOW KNEE LEG AMPUTATION Right   . CORONARY ARTERY BYPASS GRAFT  2005  . ESOPHAGOGASTRODUODENOSCOPY N/A 05/17/2016   Procedure: ESOPHAGOGASTRODUODENOSCOPY (EGD);  Surgeon: Ladene Artist, MD;  Location: [facility] ENDOSCOPY;  Service: Endoscopy;  Laterality: N/A;  . IR GENERIC HISTORICAL  07/07/2016   IR PARACENTESIS 07/07/2016 Ardis Rowan, PA-C [facility]-INTERV RAD  . IR THORACENTESIS ASP PLEURAL SPACE W/IMG GUIDE  08/04/2016  . PERIPHERAL VASCULAR  CATHETERIZATION N/A 01/11/2016   Procedure: Lower Extremity Angiography;  Surgeon: Serafina Mitchell, MD;  Location: [facility] CV LAB;  Service: Cardiovascular;  Laterality: N/A;  . STERNOTOMY    . TRANSMETATARSAL AMPUTATION Left 10/20/2016   Procedure: LEFT THIRD TOE  AMPUTATION;  Surgeon: Rosetta Posner, MD;  Location: [place];  Service: Vascular;  Laterality:  Left;  . TRANSMETATARSAL AMPUTATION Left 11/21/2016   Procedure: LEFT BELOW KNEE AMPUTATION;  Surgeon: Rosetta Posner, MD;  Location: [facility] OR;  Service: Vascular;  Laterality: Left;    Current Medications: Current Meds  Medication Sig  . acetaminophen (TYLENOL) 325 MG tablet Take 650 mg by mouth every 6 (six) hours as needed (for pain or headaches).   . Amino Acids-Protein Hydrolys (FEEDING SUPPLEMENT, PRO-STAT SUGAR FREE 64,) LIQD Take 30 mLs by mouth 2 (two) times daily.  Marland Kitchen amLODipine (NORVASC) 2.5 MG tablet Take 2.5 mg by mouth daily.  Marland Kitchen aspirin EC 81 MG EC tablet Take 1 tablet (81 mg total) by mouth daily.  Marland Kitchen atorvastatin (LIPITOR) 20 MG tablet Take 20 mg by mouth at bedtime.   . barrier cream (NON-SPECIFIED) CREA Apply 1 application topically See admin instructions. Apply to inner right scrotum twice daily for denuded area r/t scrotal edema  . bisacodyl (DULCOLAX) 10 MG suppository Place 10 mg rectally once as needed (for constipation not relieved by Milk of Magnesia).  . carvedilol (COREG) 6.25 MG tablet Take 6.25 mg by mouth 2 (two) times daily with a meal.  . colchicine 0.6 MG tablet Take 0.6 mg by mouth daily.  . digoxin (LANOXIN) 0.125 MG tablet Take 1 tablet (0.125 mg total) by mouth daily.  . ferrous sulfate 325 (65 FE) MG tablet Take 325 mg by mouth 2 (two) times daily with a meal.  . geriatric multivitamins-minerals (ELDERTONIC/GEVRABON) ELIX Take 15 mLs by mouth 2 (two) times daily.  Marland Kitchen ipratropium-albuterol (DUONEB) 0.5-2.5 (3) MG/3ML SOLN Take 3 mLs by nebulization every 6 (six) hours as needed (shortness of breath).  Marland Kitchen ketoconazole (NIZORAL) 2 % cream Apply 1 application topically daily. Apply topically to lesions on penis BID  . levothyroxine (SYNTHROID, LEVOTHROID) 25 MCG tablet Take 25 mcg by mouth daily before breakfast.  . magnesium hydroxide (MILK OF MAGNESIA) 400 MG/5ML suspension Take 30 mLs by mouth once as needed for mild constipation.  . nitroGLYCERIN (NITROSTAT) 0.4  MG SL tablet Place 0.4 mg under the tongue every 5 (five) minutes x 3 doses as needed for chest pain.   . Nutritional Supplements (ENSURE ENLIVE PO) Take 237 mLs by mouth 3 (three) times daily between meals.  . ondansetron (ZOFRAN) 4 MG tablet Take 4 mg by mouth every 6 (six) hours as needed for nausea or vomiting.  Marland Kitchen oxyCODONE-acetaminophen (PERCOCET/ROXICET) 5-325 MG tablet Take 1 tablet by mouth every 12 (twelve) hours as needed for severe pain.  . OXYGEN Inhale 2 L into the lungs as needed (shortness of breath or O2 sat <90%).   . pantoprazole (PROTONIX) 40 MG tablet Take 1 tablet (40 mg total) by mouth daily.  . polyethylene glycol (MIRALAX / GLYCOLAX) packet Take 17 g by mouth daily as needed for mild constipation.  . sacubitril-valsartan (ENTRESTO) 97-103 MG Take 1 tablet by mouth 2 (two) times daily.  . sertraline (ZOLOFT) 25 MG tablet Take 25 mg by mouth daily.     Allergies:   Hydralazine; Percocet [oxycodone-acetaminophen]; Tramadol; and Remeron [mirtazapine]   Social History   Social History  . Marital status: Widowed  Spouse name: N/A  . Number of children: N/A  . Years of education: N/A   Social History Main Topics  . Smoking status: Former Smoker    Packs/day: 0.10    Years: 54.00    Types: Cigarettes    Quit date: 04/25/2016  . Smokeless tobacco: Never Used  . Alcohol use Yes     Comment: 04/02/2016 "aint suppose to drink in the nursing home"  . Drug use: Yes    Types: Cocaine, Marijuana     Comment: marijuana use daily, rare cocain use.  Previously did snort and inject drugs through 2017. Hasn't had access to street drugs from 10/201 7 through February/201 8  . Sexual activity: Not Currently   Other Topics Concern  . None   Social History Narrative   As of fall/2017 patient had been living in [place] but then entered SNF in [place]. His wife passed away in summer 2017.     Family History:  The patient's family history includes Diabetes in his father and  other; Hypertension in his other; Lupus in his other; Mental illness in his other.   ROS:   Please see the history of present illness.    Review of Systems  Constitution: Positive for weakness and malaise/fatigue.  Cardiovascular: Positive for irregular heartbeat.  Respiratory: Positive for cough, shortness of breath, sleep disturbances due to breathing and sputum production.    All other systems reviewed and are negative.   PHYSICAL EXAM:   VS:  BP (!) 160/98   Pulse 73   Ht _0  (1.753 m)   Wt 139 lb (63 kg) Comment: per vascular office weight  BMI 20.53 kg/m   Physical Exam  GEN: Well nourished, well developed, in no acute distress  Neck: Increased JVD, carotid bruits, or masses Cardiac:RRR; no murmurs, rubs, or gallops  Respiratory:  Decreased breath sounds with Rales at the bases GI: soft, nontender, nondistended, + BS Ext:MS: no deformity or atrophy  Neuro:  Alert and Oriented x 3 Psych: euthymic mood, full affect  Wt Readings from Last 3 Encounters:  01/19/17 139 lb (63 kg)  12/26/16 144 lb 9.6 oz (65.6 kg)  12/23/16 139 lb (63 kg)      Studies/Labs Reviewed:   EKG:  EKG is ordered today.  The ekg ordered today demonstrates  Atrial flutter at 73 bpm nonspecific ST-T wave changes  Recent Labs: 07/18/2016: TSH 2.74 08/27/2016: B Natriuretic Peptide 1,093.5 11/17/2016: ALT 10 11/21/2016: BUN 42; Creatinine, Ser 1.01; Magnesium 1.6; Potassium 3.1; Sodium 139 12/27/2016: Hemoglobin 8.3; Platelets 165   Lipid Panel    Component Value Date/Time   CHOL 88 06/11/2016   TRIG 84 06/11/2016   HDL 42 06/11/2016   LDLCALC 30 06/11/2016    Additional studies/ records that were reviewed today include:  2-D echo 8/7/18Study Conclusions   - Left ventricle: Systolic function was vigorous. The estimated   ejection fraction was in the range of 65% to 70%. Wall motion was   normal; there were no regional wall motion abnormalities. The   study is not technically sufficient to  allow evaluation of LV   diastolic function. - Mitral valve: Moderately calcified annulus. There was trivial   regurgitation. - Left atrium: The atrium was at the upper limits of normal in   size. - Right atrium: The atrium was at the upper limits of normal in   size. - Tricuspid valve: There was moderate regurgitation. - Pulmonary arteries: PA peak pressure: 49 mm Hg (S). -  Inferior vena cava: The vessel was normal in size. The   respirophasic diameter changes were in the normal range (>= 50%),   consistent with normal central venous pressure.   Impressions:   - Compared to a prior study in 07/2016, the LVEF is higher at   65-70%. 2-D echo 4/22/18Study Conclusions   - Left ventricle: The cavity size was normal. Wall thickness was   increased in a pattern of mild LVH. Systolic function was   moderately reduced. The estimated ejection fraction was in the   range of 35% to 40%. Diffuse hypokinesis worse in inferior and   inferolateral walls. - Ventricular septum: The contour showed diastolic flattening and   systolic flattening. - Mitral valve: Calcified annulus. An annular ring prosthesis was   present. There was moderate regurgitation. There is inflow   turbulence across the MV ring. Visually the pressure 1/2 time   velocity looks mild but was not quantified. The gradient was not   measured. Consider repeat imaging to further evalaute. - Left atrium: The atrium was moderately dilated. - Right ventricle: The cavity size was moderately dilated. Systolic   function was moderately reduced. - Right atrium: The atrium was severely dilated. - Tricuspid valve: There was moderate regurgitation. - Pulmonary arteries: PA peak pressure: 43 mm Hg (S).       ASSESSMENT:    1. Acute on chronic combined systolic and diastolic CHF (congestive heart failure) ([place])   2. Preoperative clearance   3. Benign essential HTN   4. PAF (paroxysmal atrial fibrillation) ([place])   5. CAD in native  artery      PLAN:  In order of problems listed above:  Acute on chronic combined systolic and diastolic CHF. Patient was hospitalized 11/2016 with hypotension, potassium greater than 7.5 and creatinine of 8 improved once Demadex and Aldactone were stopped. Continued on Entresto. 2-D echo showed improved LV function to 65-70% compared to echo 07/2016 EF 35-40%. Patient has been coughing for over a week now neck veins are elevated and he has rails. He's refused to be weighed at the nursing facility. He's also had another amputation so we don't know his baseline weight is. Will check bmet today. Begin Demadex 60 mg daily. Repeat be met in 2 weeks and follow-up with CHF clinic.  Preoperative clearance for penile debridement, biopsy and possible circumcision by Dr. Lovena Neighbours. Patient is in active heart failure at this time and cannot be cleared for surgery. Follow-up with heart failure clinic in 1 week and let them reevaluate. Patient with significant medical problems will put him at high risk for any type of surgery.  Hypertension patient's blood pressure is elevated today but he is in heart failure. Resuming Demadex  PAF not on anticoagulation due to history of IC hemorrhage prior GI bleed and cirrhosis  CAD status post remote CABG without angina     Medication Adjustments/Labs and Tests Ordered: Current medicines are reviewed at length with the patient today.  Concerns regarding medicines are outlined above.  Medication changes, Labs and Tests ordered today are listed in the Patient Instructions below. Patient Instructions  Your physician has recommended you make the following change in your medication:  1) restart Torsemide (DEMADEX) 60 mg once a day  Your physician recommends that you return for lab work today (BMET) and in 2 weeks (BMET)  Your physician recommends that you schedule a follow-up appointment with the [facility] for follow up and for cardiac clearance.  See below.  Sumner Boast, PA-C  01/19/2017 9:55 AM    [facility] [address], [place], [place]  [address] Phone: [phone]; Fax: [phone]

## 2017-01-20 ENCOUNTER — Encounter: Payer: Self-pay | Admitting: Adult Health

## 2017-01-20 ENCOUNTER — Non-Acute Institutional Stay (SKILLED_NURSING_FACILITY): Payer: Medicare Other | Admitting: Adult Health

## 2017-01-20 DIAGNOSIS — I5022 Chronic systolic (congestive) heart failure: Secondary | ICD-10-CM | POA: Diagnosis not present

## 2017-01-20 DIAGNOSIS — N489 Disorder of penis, unspecified: Secondary | ICD-10-CM

## 2017-01-20 DIAGNOSIS — I480 Paroxysmal atrial fibrillation: Secondary | ICD-10-CM | POA: Diagnosis not present

## 2017-01-20 DIAGNOSIS — R059 Cough, unspecified: Secondary | ICD-10-CM

## 2017-01-20 DIAGNOSIS — I10 Essential (primary) hypertension: Secondary | ICD-10-CM | POA: Diagnosis not present

## 2017-01-20 DIAGNOSIS — D638 Anemia in other chronic diseases classified elsewhere: Secondary | ICD-10-CM

## 2017-01-20 DIAGNOSIS — E039 Hypothyroidism, unspecified: Secondary | ICD-10-CM

## 2017-01-20 DIAGNOSIS — R05 Cough: Secondary | ICD-10-CM | POA: Diagnosis not present

## 2017-01-20 DIAGNOSIS — E1151 Type 2 diabetes mellitus with diabetic peripheral angiopathy without gangrene: Secondary | ICD-10-CM | POA: Diagnosis not present

## 2017-01-20 NOTE — Progress Notes (Signed)
DATE:  01/20/2017   MRN:  [id number]  BIRTHDAY: 07-26-1945  Facility:  Nursing Home Location:  [facility] Room Number: 113-A  LEVEL OF CARE:  SNF (31)  Contact Information    Name Relation Home Work Mobile   Ronald Simpson,Ronald Simpson Niece [phone]  [phone]   Tait,Clementine Mother [phone]     Navarro, Nine Daughter   [phone]       Code Status History    Date Active Date Inactive Code Status Order ID Comments User Context   11/17/2016  9:57 PM 11/23/2016  5:34 PM Full Code [id number]  Tobey Grim, NP ED   10/17/2016  7:29 PM 10/23/2016  7:44 PM Full Code [id number]  Briscoe Deutscher, MD ED   08/27/2016  2:20 AM 09/06/2016  5:35 PM Full Code [id number]  Kathlene Cote, PA-C ED   08/02/2016  8:29 PM 08/07/2016  6:48 PM Full Code [id number]  Michael Litter, MD ED   05/15/2016  5:59 PM 05/21/2016  9:41 PM Full Code [id number]  Penny Pia, MD Inpatient   04/02/2016  3:19 PM 04/08/2016  8:26 PM Full Code [id number]  Hyacinth Meeker, MD ED   12/27/2015  1:27 AM 01/14/2016  5:17 PM Full Code [id number]  Danford, Earl Lites, MD Inpatient       Chief Complaint  Patient presents with  . Medical Management of Chronic Issues    Routine visit    HISTORY OF PRESENT ILLNESS:  This is a 26-YO male seen for a routine visit.  He is a long-term care resident of [facility].  He has a PMH of PVD with gangrene, AKI, severe hyperkalemia, chronic CHF with systolic dysfunction, acquired hypothyroidism, uncontrolled DM2 with complication, cirrhosis, and history of CVA. He was seen in his room today. He was in good mood today. He was friendly today.  He has a lesion on his penis and urology wants to do biopsy but wants to get clearance from cardiology first. He has completed antibiotic treatment but lesion did not heal. He went to cardiology yesterday but was not cleared due to CHF. He has a return appointment to see the cardiologist next week. He  complains of productive cough with whitish and yellowish phlegm. No reported fever.    PAST MEDICAL HISTORY:  Past Medical History:  Diagnosis Date  . CAD (coronary artery disease)    a. remote CABG.  . Chronic anemia   . Chronic combined systolic and diastolic CHF (congestive heart failure) (HCC)   . Cirrhosis (HCC)   . CKD (chronic kidney disease), stage III (HCC)   . CVA (cerebral infarction)   . DM (diabetes mellitus), type 2 with peripheral vascular complications (HCC)   . History of hepatitis C 1990s   Hep B immune  . Hypoalbuminemia   . IDDM (insulin dependent diabetes mellitus) (HCC)   . Mitral regurgitation    a. s/p prior ring annuloplasty prosthesis per echo with mod MR by echo 07/2016.  Marland Kitchen PAF (paroxysmal atrial fibrillation) (HCC)    a. not on anticoagulation likely due to h/o IC hemorrhage, prior GIB, cirrhosis. b. suspected prior LAA clipping based on imaging.  . Peripheral neuropathy   . Peripheral vascular disease (HCC)   . Proteinuria      CURRENT MEDICATIONS: Reviewed  Patient's Medications  New Prescriptions   No medications on file  Previous Medications   ACETAMINOPHEN (TYLENOL) 325 MG TABLET    Take 650 mg by mouth every 6 (six) hours  as needed (for pain or headaches).    AMINO ACIDS-PROTEIN HYDROLYS (FEEDING SUPPLEMENT, PRO-STAT SUGAR FREE 64,) LIQD    Take 30 mLs by mouth 2 (two) times daily.   AMLODIPINE (NORVASC) 2.5 MG TABLET    Take 2.5 mg by mouth daily.   ASPIRIN EC 81 MG EC TABLET    Take 1 tablet (81 mg total) by mouth daily.   ATORVASTATIN (LIPITOR) 20 MG TABLET    Take 20 mg by mouth at bedtime.    BARRIER CREAM (NON-SPECIFIED) CREA    Apply 1 application topically See admin instructions. Apply to inner right scrotum twice daily for denuded area r/t scrotal edema   BISACODYL (DULCOLAX) 10 MG SUPPOSITORY    Place 10 mg rectally once as needed (for constipation not relieved by Milk of Magnesia).   CARVEDILOL (COREG) 6.25 MG TABLET    Take 6.25 mg  by mouth 2 (two) times daily with a meal.   CLOTRIMAZOLE (LOTRIMIN) 1 % CREAM    Apply 1 application topically 2 (two) times daily. Apply to penis lesion   COLCHICINE 0.6 MG TABLET    Take 0.6 mg by mouth daily.   DIGOXIN (LANOXIN) 0.125 MG TABLET    Take 1 tablet (0.125 mg total) by mouth daily.   FERROUS SULFATE 325 (65 FE) MG TABLET    Take 325 mg by mouth 2 (two) times daily with a meal.   GERIATRIC MULTIVITAMINS-MINERALS (ELDERTONIC/GEVRABON) ELIX    Take 15 mLs by mouth 2 (two) times daily.   GUAIFENESIN-DEXTROMETHORPHAN (ROBITUSSIN DM) 100-10 MG/5ML SYRUP    Take 10 mLs by mouth 2 (two) times daily as needed for cough.   IPRATROPIUM-ALBUTEROL (DUONEB) 0.5-2.5 (3) MG/3ML SOLN    Take 3 mLs by nebulization every 6 (six) hours as needed (shortness of breath).   LEVOTHYROXINE (SYNTHROID, LEVOTHROID) 25 MCG TABLET    Take 25 mcg by mouth daily before breakfast.   MAGNESIUM HYDROXIDE (MILK OF MAGNESIA) 400 MG/5ML SUSPENSION    Take 30 mLs by mouth once as needed for mild constipation.   NITROGLYCERIN (NITROSTAT) 0.4 MG SL TABLET    Place 0.4 mg under the tongue every 5 (five) minutes x 3 doses as needed for chest pain.    NUTRITIONAL SUPPLEMENTS (ENSURE ENLIVE PO)    Take 237 mLs by mouth 3 (three) times daily between meals.   ONDANSETRON (ZOFRAN) 4 MG TABLET    Take 4 mg by mouth every 6 (six) hours as needed for nausea or vomiting.   OXYCODONE-ACETAMINOPHEN (PERCOCET/ROXICET) 5-325 MG TABLET    Take 1 tablet by mouth every 12 (twelve) hours as needed for severe pain.   OXYGEN    Inhale 2 L into the lungs as needed (shortness of breath or O2 sat <90%).    PANTOPRAZOLE (PROTONIX) 40 MG TABLET    Take 1 tablet (40 mg total) by mouth daily.   POLYETHYLENE GLYCOL (MIRALAX / GLYCOLAX) PACKET    Take 17 g by mouth daily as needed for mild constipation.   SACUBITRIL-VALSARTAN (ENTRESTO) 97-103 MG    Take 1 tablet by mouth 2 (two) times daily.   SERTRALINE (ZOLOFT) 25 MG TABLET    Take 25 mg by mouth  daily.   TORSEMIDE (DEMADEX) 20 MG TABLET    Take 3 tablets (60 mg total) by mouth daily.  Modified Medications   No medications on file  Discontinued Medications   KETOCONAZOLE (NIZORAL) 2 % CREAM    Apply 1 application topically daily. Apply topically to lesions  on penis BID     Allergies  Allergen Reactions  . Hydralazine Other (See Comments)    4/21-4/26/18 pleuritic chest pain with bilateral effusions and pericardial rub. Rule out hydralazine drug-induced lupus   . Percocet [Oxycodone-Acetaminophen] Other (See Comments)    3 episodes of unresponsiveness & apnea; he has received a total of 5 doses of Narcan to date for resuscitation (2 in [facility] ED & 3 @ [facility]) Hx of drug abuse (cocaine, canabis,alcohol, tobacco) Opioids CONTRAINDICATED !!!   . Tramadol Other (See Comments)    Excess sedation; ? Related to pre-existing hepatic dysfunction with PMH Hep C  . Remeron [Mirtazapine]      REVIEW OF SYSTEMS:  GENERAL: no change in appetite, no fatigue, no weight changes, no fever, chills or weakness MOUTH and THROAT: Denies oral discomfort, gingival pain or bleeding  RESPIRATORY: no SOB, DOE, wheezing, hemoptysis, +cough CARDIAC: no chest pain, edema or palpitations GI: no abdominal pain, diarrhea, constipation, heart burn, nausea or vomiting GU: Denies dysuria, frequency, hematuria, incontinence, or discharge PSYCHIATRIC: Denies feeling of depression or anxiety. No report of hallucinations, insomnia, paranoia, or agitation     PHYSICAL EXAMINATION  GENERAL APPEARANCE: Well nourished. In no acute distress. Normal body habitus SKIN:  Has non healing lesion on penis MOUTH and THROAT: Lips are without lesions. Oral mucosa is moist and without lesions. 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 Affect and behavior are appropriate   LABS/RADIOLOGY: Labs reviewed: Basic Metabolic Panel:  Recent Labs  11/91/47 0217 11/20/16 0536 11/21/16 0401 01/19/17 0957  NA 141 141 139 141  K 3.5 3.1* 3.1* 4.1  CL 95* 95* 98* 103  CO2 30 37* 34* 23  GLUCOSE 88 196* 214* 210*  BUN 138* 78* 42* 6*  CREATININE 3.84* 1.85* 1.01 0.75*  CALCIUM 8.2* 8.6* 8.2* 8.4*  MG 1.6* 1.8 1.6*  --   PHOS 3.6 2.0* 1.2*  --    Liver Function Tests:  Recent Labs  10/21/16 0306 10/23/16 0330 11/17/16 1755 11/19/16 0217 11/20/16 0536  AST 13* 16 17  --   --   ALT 10* 10* 10*  --   --   ALKPHOS 134* 152* 186*  --   --   BILITOT 0.7 0.6 1.3*  --   --   PROT 6.2* 6.3* 8.8*  --   --   ALBUMIN 2.0* 1.8* 2.6* 2.0* 2.0*    Recent Labs  04/02/16 1121 05/18/16 1111 11/18/16 0012  AMMONIA 53* 31 42*   CBC:  Recent Labs  10/18/16 0321  11/17/16 1755  11/19/16 0217 11/20/16 0536 11/21/16 0401 12/27/16  WBC 8.6  < > 19.8*  < > 16.7* 17.0* 14.6* 5.8  NEUTROABS 6.2  --  17.4*  --   --   --   --  4  HGB 13.7  < > 12.5*  < > 10.7* 10.9* 9.2* 8.3*  HCT 43.1  < > 39.4  < > 32.8* 33.9* 29.7* 27*  MCV 87.1  < > 87.2  < > 84.1 87.6 88.4  --   PLT 202  < > 332  < > 258 199 186 165  < > = values in this interval not displayed. Lipid Panel:  Recent  Labs  06/09/16 06/11/16  HDL 38 42   Cardiac Enzymes:  Recent Labs  08/27/16 0016 08/27/16 0411 08/27/16 1120  TROPONINI 0.03* 0.07* 0.06*   CBG:  Recent Labs  11/23/16 0052 11/23/16 0538 11/23/16 0809  GLUCAP 118* 134* 136*     ASSESSMENT/PLAN:  1. Cough - start Mucinex 600 mg 1 tab twice a day 2 weeks   2. Chronic systolic CHF (congestive heart failure) (HCC) - no SOB, continue and Entresto 97-103 milligrams 1 tab twice a day, digoxin 125 g 1 tab daily, Demadex 20 mg 3 tabs = 60 mg daily and carvedilol 6.25 mg 1 tab twice a day   3.  Diabetes mellitus with peripheral vascular disease (HCC) - start CBG daily, not on any medication at this time Lab Results  Component Value Date   HGBA1C 7.4 (H) 10/20/2016     4. Hypothyroidism, acquired - continue levothyroxine 25 g 1 tab daily Lab Results  Component Value Date   TSH 2.74 07/18/2016    5. Benign essential HTN - Well-controlled, continue amlodipine 2.5 mg 1 tab 3 times a day and carvedilol 6.25 mg 1 tab twice a day   6. Penile lesion - follows up with urology, for possible biopsy of penile lesion pending cardiology clearance   7. PAF (paroxysmal atrial fibrillation) (HCC) - rate controlled, continue digoxin 125 g 1 tab daily, aspirin 81 mg 1 tab daily   8. Chronic anemia - continue ferrous sulfate 325 mg 1 tablet twice a day Lab Results  Component Value Date   HGB 8.3 (A) 12/27/2016      Goals of care:  Long-term care    Monina C. Medina-Vargas - NP   [employer] [phone]

## 2017-01-26 ENCOUNTER — Inpatient Hospital Stay (HOSPITAL_COMMUNITY): Admission: RE | Admit: 2017-01-26 | Payer: Medicare Other | Source: Ambulatory Visit

## 2017-01-27 ENCOUNTER — Emergency Department (HOSPITAL_COMMUNITY)
Admission: EM | Admit: 2017-01-27 | Discharge: 2017-02-12 | Disposition: E | Payer: Medicare Other | Attending: Emergency Medicine | Admitting: Emergency Medicine

## 2017-01-27 ENCOUNTER — Encounter (HOSPITAL_COMMUNITY): Payer: Self-pay | Admitting: Emergency Medicine

## 2017-01-27 DIAGNOSIS — Z87891 Personal history of nicotine dependence: Secondary | ICD-10-CM | POA: Insufficient documentation

## 2017-01-27 DIAGNOSIS — E039 Hypothyroidism, unspecified: Secondary | ICD-10-CM | POA: Diagnosis not present

## 2017-01-27 DIAGNOSIS — Z7982 Long term (current) use of aspirin: Secondary | ICD-10-CM | POA: Insufficient documentation

## 2017-01-27 DIAGNOSIS — Z79899 Other long term (current) drug therapy: Secondary | ICD-10-CM | POA: Diagnosis not present

## 2017-01-27 DIAGNOSIS — N183 Chronic kidney disease, stage 3 (moderate): Secondary | ICD-10-CM | POA: Insufficient documentation

## 2017-01-27 DIAGNOSIS — I2510 Atherosclerotic heart disease of native coronary artery without angina pectoris: Secondary | ICD-10-CM | POA: Insufficient documentation

## 2017-01-27 DIAGNOSIS — R0681 Apnea, not elsewhere classified: Secondary | ICD-10-CM | POA: Diagnosis present

## 2017-01-27 DIAGNOSIS — I469 Cardiac arrest, cause unspecified: Secondary | ICD-10-CM | POA: Insufficient documentation

## 2017-01-27 DIAGNOSIS — E1122 Type 2 diabetes mellitus with diabetic chronic kidney disease: Secondary | ICD-10-CM | POA: Insufficient documentation

## 2017-01-27 LAB — ACID FAST SMEAR (AFB): ACID FAST SMEAR - AFSCU2: NEGATIVE

## 2017-01-27 MED ORDER — SODIUM BICARBONATE 8.4 % IV SOLN
INTRAVENOUS | Status: AC | PRN
  Administered 2017-01-27: 50 meq via INTRAVENOUS

## 2017-01-27 MED ORDER — EPINEPHRINE PF 1 MG/10ML IJ SOSY
PREFILLED_SYRINGE | INTRAMUSCULAR | Status: AC | PRN
  Administered 2017-01-27 (×4): 1 mg via INTRAVENOUS

## 2017-01-28 ENCOUNTER — Encounter: Payer: Self-pay | Admitting: Internal Medicine

## 2017-02-03 ENCOUNTER — Telehealth: Payer: Self-pay

## 2017-02-03 ENCOUNTER — Encounter (HOSPITAL_COMMUNITY): Payer: Medicare Other

## 2017-02-03 NOTE — Telephone Encounter (Signed)
On 02/03/17 [facility] The d/c is for burial. The patient is a patient of Water quality scientistDoctor Hopper. The d/c will be taken to [facility] tomorrow am for signature.  On 02/03/17 I was told by Isla PenceJ Lepper at [facility] that Doctor Alwyn RenHopper has retired. I called the funeral home and they are going to come back and get the death certificate.

## 2017-02-12 NOTE — ED Notes (Signed)
Patient transported to morgue.

## 2017-02-12 NOTE — ED Provider Notes (Signed)
[facility] EMERGENCY DEPARTMENT Provider Note   CSN: [id number] Arrival date & time: 02/09/2017  2022  History   Chief Complaint Chief Complaint  Patient presents with  . Cardiac Arrest    HPI Shayaan Parke is a 71 y.o. male.  The patient is a 71 year old chronically ill male with a medical history significant for diabetes, PVD, hepatitis C, CVA, CAD stage III, cirrhosis, and CAD, who presents to the ED after being found unresponsive, pulseless, and apneic. The patient was found by nursing home staff at 1942 this evening, unresponsive in his bed. Chest compressions were initiated by nursing home staff and EMS was called. At the time of EMS arrival, the patient was unresponsive, pulseless with asystole on the monitor, and apneic. 10 rounds of epinephrine were administered prior to ED arrival. An IO in place in the patient's left tibia, and a King airway was in place.  Blood glucose > 200 in the field.  The patient was unresponsive at the time.   The history is provided by the EMS personnel and medical records. No language interpreter was used.    Past Medical History:  Diagnosis Date  . CAD (coronary artery disease)    a. remote CABG.  . Chronic anemia   . Chronic combined systolic and diastolic CHF (congestive heart failure) (HCC)   . Cirrhosis (HCC)   . CKD (chronic kidney disease), stage III (HCC)   . CVA (cerebral infarction)   . DM (diabetes mellitus), type 2 with peripheral vascular complications (HCC)   . History of hepatitis C 1990s   Hep B immune  . Hypoalbuminemia   . IDDM (insulin dependent diabetes mellitus) (HCC)   . Mitral regurgitation    a. s/p prior ring annuloplasty prosthesis per echo with mod MR by echo 07/2016.  Marland Kitchen PAF (paroxysmal atrial fibrillation) (HCC)    a. not on anticoagulation likely due to h/o IC hemorrhage, prior GIB, cirrhosis. b. suspected prior LAA clipping based on imaging.  . Peripheral neuropathy   . Peripheral vascular  disease (HCC)   . Proteinuria     Patient Active Problem List   Diagnosis Date Noted  . Preoperative clearance 01/19/2017  . Penile lesion 01/01/2017  . S/P unilateral BKA (below knee amputation) (HCC)   . S/P unilateral BKA (below knee amputation), left (HCC)   . S/P AKA (above knee amputation) unilateral, right (HCC)   . Post-operative pain   . Acute blood loss anemia   . Anemia of chronic disease   . Chronic combined systolic and diastolic congestive heart failure (HCC)   . Fungal dermatitis 10/27/2016  . Protein-calorie malnutrition, severe 10/19/2016  . Hyperkalemia 10/17/2016  . Delayed surgical wound healing of foot amputation stump (HCC)   . Hypotension   . Adult failure to thrive 09/16/2016  . Status post thoracentesis   . Cardiogenic shock (HCC)   . Respiratory failure with hypoxia (HCC) 08/27/2016  . Chronic systolic CHF (congestive heart failure) (HCC)   . Septic shock (HCC)   . Chronic bronchitis (HCC) 08/21/2016  . Positive ANA (antinuclear antibody) 08/21/2016  . Pericarditis 08/12/2016  . Acute on chronic combined systolic and diastolic CHF (congestive heart failure) (HCC) 08/05/2016  . Acute kidney injury superimposed on chronic kidney disease (HCC) 08/05/2016  . Chest pain 08/02/2016  . Pleuritic chest pain 08/02/2016  . Pleural effusion 08/02/2016  . Elevated brain natriuretic peptide (BNP) level 08/02/2016  . Chronic anemia 07/22/2016  . Supratherapeutic international normalized ratio (INR) 07/17/2016  .  Ascites 06/27/2016  . Chronic anticoagulation   . Cirrhosis (HCC)   . Acute GI bleeding 05/15/2016  . Melanotic stools 05/15/2016  . Altered mental status 04/10/2016  . Gangrene of toe of left foot (HCC)   . Cystitis   . 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 At risk for adverse drug event 02/14/2016  . Peripheral neuropathy 01/31/2016  . Scrotal edema 01/15/2016  . Dry gangrene (HCC) 01/15/2016  . Renal insufficiency  01/15/2016  . PAD (peripheral artery disease) (HCC) 01/11/2016  . 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 Tobacco abuse   . Tachycardia   . Hyponatremia   . Hypoalbuminemia   . MRSA cellulitis of left foot   . Serratia marcescens infection (HCC)   . Escherichia coli infection   . CAD in native artery   . PAF (paroxysmal atrial fibrillation) (HCC)   . Uncontrolled type 2 diabetes mellitus with complication (HCC)   . Hepatitis C without hepatic coma 12/27/2015  . Diabetes mellitus with peripheral vascular disease (HCC) 12/27/2015  . History of CVA (cerebrovascular accident) 12/27/2015  . Hypothyroidism, acquired 12/27/2015  . Benign essential HTN 12/27/2015    Past Surgical History:  Procedure Laterality Date  . AMPUTATION Right 01/07/2016   Procedure: AMPUTATION ABOVE KNEE;  Surgeon: Larina Earthly, MD;  Location: [facility] OR;  Service: Vascular;  Laterality: Right;  . AMPUTATION Left 04/04/2016   Procedure: LEFT FOURTH AND FIFTH  TOE AMPUTATION;  Surgeon: Larina Earthly, MD;  Location: [facility] OR;  Service: Vascular;  Laterality: Left;  . BELOW KNEE LEG AMPUTATION Right   . CORONARY ARTERY BYPASS GRAFT  2005  . ESOPHAGOGASTRODUODENOSCOPY N/A 05/17/2016   Procedure: ESOPHAGOGASTRODUODENOSCOPY (EGD);  Surgeon: Meryl Dare, MD;  Location[facility] ENDOSCOPY;  Service: Endoscopy;  Laterality: N/A;  . IR GENERIC HISTORICAL  07/07/2016   IR PARACENTESIS 07/07/2016 Gershon Crane, PA-C [facility]-INTERV RAD  . IR THORACENTESIS ASP PLEURAL SPACE W/IMG GUIDE  08/04/2016  . PERIPHERAL VASCULAR CATHETERIZATION N/A 01/11/2016   Procedure: Lower Extremity Angiography;  Surgeon: Nada Libman, MD;  Location: [facility] INVASIVE CV LAB;  Service: Cardiovascular;  Laterality: N/A;  . STERNOTOMY    . TRANSMETATARSAL AMPUTATION Left 10/20/2016   Procedure: LEFT THIRD TOE  AMPUTATION;  Surgeon: Larina Earthly, MD;  Location: [facility] OR;  Service: Vascular;  Laterality: Left;  .  TRANSMETATARSAL AMPUTATION Left 11/21/2016   Procedure: LEFT BELOW KNEE AMPUTATION;  Surgeon: Larina Earthly, MD;  Location: [facility] OR;  Service: Vascular;  Laterality: Left;       Home Medications    Prior to Admission medications   Medication Sig Start Date End Date Taking? Authorizing Provider  acetaminophen (TYLENOL) 325 MG tablet Take 650 mg by mouth every 6 (six) hours as needed (for pain or headaches).     [provider]  Amino Acids-Protein Hydrolys (FEEDING SUPPLEMENT, PRO-STAT SUGAR FREE 64,) LIQD Take 30 mLs by mouth 2 (two) times daily. 11/23/16   Calvert Cantor, MD  amLODipine (NORVASC) 2.5 MG tablet Take 2.5 mg by mouth daily.    [provider]  aspirin EC 81 MG EC tablet Take 1 tablet (81 mg total) by mouth daily. 08/08/16   Richarda Overlie, MD  atorvastatin (LIPITOR) 20 MG tablet Take 20 mg by mouth at bedtime.     [provider]  barrier cream (NON-SPECIFIED) CREA Apply 1 application topically See admin instructions. Apply to inner right scrotum  twice daily for denuded area r/t scrotal edema    [provider]  bisacodyl (DULCOLAX) 10 MG suppository Place 10 mg rectally once as needed (for constipation not relieved by Milk of Magnesia).    [provider]  carvedilol (COREG) 6.25 MG tablet Take 6.25 mg by mouth 2 (two) times daily with a meal.    [provider]  clotrimazole (LOTRIMIN) 1 % cream Apply 1 application topically 2 (two) times daily. Apply to penis lesion    [provider]  colchicine 0.6 MG tablet Take 0.6 mg by mouth daily.    [provider]  digoxin (LANOXIN) 0.125 MG tablet Take 1 tablet (0.125 mg total) by mouth daily. 09/06/16   Johnson, Clanford L, MD  ferrous sulfate 325 (65 FE) MG tablet Take 325 mg by mouth 2 (two) times daily with a meal.    [provider]  geriatric multivitamins-minerals (ELDERTONIC/GEVRABON) ELIX Take 15 mLs by mouth 2 (two) times daily.    [provider]  guaiFENesin-dextromethorphan (ROBITUSSIN DM) 100-10 MG/5ML syrup Take 10 mLs by mouth 2 (two) times daily as needed for cough.    [provider]  ipratropium-albuterol (DUONEB) 0.5-2.5 (3) MG/3ML SOLN Take 3 mLs by nebulization every 6 (six) hours as needed (shortness of breath).    [provider]  levothyroxine (SYNTHROID, LEVOTHROID) 25 MCG tablet Take 25 mcg by mouth daily before breakfast.    [provider]  magnesium hydroxide (MILK OF MAGNESIA) 400 MG/5ML suspension Take 30 mLs by mouth once as needed for mild constipation.    [provider]  nitroGLYCERIN (NITROSTAT) 0.4 MG SL tablet Place 0.4 mg under the tongue every 5 (five) minutes x 3 doses as needed for chest pain.     [provider]  Nutritional Supplements (ENSURE ENLIVE PO) Take 237 mLs by mouth 3 (three) times daily between meals.    [provider]  ondansetron (ZOFRAN) 4 MG tablet Take 4 mg by mouth every 6 (six) hours as needed for nausea or vomiting.    [provider]  oxyCODONE-acetaminophen (PERCOCET/ROXICET) 5-325 MG tablet Take 1 tablet by mouth every 12 (twelve) hours as needed for severe pain.    [provider]  OXYGEN Inhale 2 L into the lungs as needed (shortness of breath or O2 sat <90%).     [provider]  pantoprazole (PROTONIX) 40 MG tablet Take 1 tablet (40 mg total) by mouth daily. 05/22/16   Rodolph Bong, MD  polyethylene glycol [facility] / Ethelene Hal) packet Take 17 g by mouth daily as needed for mild constipation. 10/23/16   Richarda Overlie, MD  sacubitril-valsartan (ENTRESTO) 97-103 MG Take 1 tablet by mouth 2 (two) times daily. 11/05/16   Richarda Overlie, MD  sertraline (ZOLOFT) 25 MG tablet Take 25 mg by mouth daily.    [provider]  torsemide (DEMADEX) 20 MG tablet Take 3 tablets (60 mg total) by mouth daily. 01/19/17   Dyann Kief, PA-C    Family History Family History  Problem Relation Age  of Onset  . Hypertension Other   . Diabetes Other   . Mental illness Other   . Lupus Other   . Diabetes Father   . Cancer Neg Hx   . Heart disease Neg Hx   . Stroke Neg Hx     Social History Social History  Substance Use Topics  . Smoking status: Former Smoker    Packs/day: 0.10    Years: 54.00  Types: Cigarettes    Quit date: 04/25/2016  . Smokeless tobacco: Never Used  . Alcohol use Yes     Comment: 04/02/2016 "aint suppose to drink in the nursing home"     Allergies   Hydralazine; Percocet [oxycodone-acetaminophen]; Tramadol; and Remeron [mirtazapine]   Review of Systems Review of Systems  Unable to perform ROS: Patient unresponsive     Physical Exam Updated Vital Signs Wt 79.4 kg (175 lb)   BMI 25.84 kg/m   Physical Exam  Constitutional: He appears well-developed.  Unresponsive, ill-appearing male who appears older than documented age  HENT:  Head: Normocephalic and atraumatic.  Poor dentition  Eyes:  Pupils dilated, fixed  Cardiovascular:  pulseless  Pulmonary/Chest: No stridor.  Apneic, however clear breath sounds and equal chest rise with BVM  Abdominal: He exhibits no distension.  Musculoskeletal:  Right AKA, left BKA  Skin:  Cool skin  Psychiatric:  Unable to assess    ED Treatments / Results  Labs (all labs ordered are listed, but only abnormal results are displayed) Labs Reviewed - No data to display  EKG  EKG Interpretation None      Radiology No results found.  Procedures Procedure Name: Intubation Date/Time: 01/28/2017 1:28 AM Performed by: Levester Fresh Pre-anesthesia Checklist: Patient identified and Patient being monitored Induction Type: Rapid sequence Laryngoscope Size: Glidescope and 4 Grade View: Grade I Tube size: 8.0 mm Number of attempts: 1 Placement Confirmation: ETT inserted through vocal cords under direct vision,  Positive ETCO2,  CO2 detector and Breath sounds checked- equal and bilateral Secured at:  22 cm Tube secured with: ETT holder Comments: Purulent discharge present in ET tube following intubation.      (including critical care time)  Medications Ordered in ED Medications  EPINEPHrine (ADRENALIN) 1 MG/10ML injection (1 mg Intravenous Given 2017/01/29 2032/08/17)  sodium bicarbonate injection (50 mEq Intravenous Given 2017-01-29 08-17-2024)   Initial Impression / Assessment and Plan / ED Course  I have reviewed the triage vital signs and the nursing notes.  Pertinent labs & imaging results that were available during my care of the patient were reviewed by me and considered in my medical decision making (see chart for details).    On arrival to the ED, the patient had a King airway in place with a [place] performing chest compressions.  The patient was pulseless and apneic. CPR was continued and medications were administered per ACLS protocol. The patient's Brooke Dare airway was exchanged for an ET tube; see procedure details above.  Multiple rounds of epinephrine were administered in addition to sodium bicarbonate. See nursing documentation for complete details of drug administration.  Following multiple rounds of CPR and placement of an endotracheal tube, the patient remained pulseless without spontaneous respirations. Asystole was noted on the monitor multiple times; I did not appreciate any PA while the patient was under my care in the ED.  I made the decision to stop resuscitative efforts at 2034/08/18; time of death was pronounced at this time.  The patient is a chronically ill male with multiple medical comorbidities. I suspect he may have had a myocardial infarction. Alternatively, he may have had sepsis from pneumonia given the appearance of the secretions in his ET tube following intubation.  I spoke with the patient's niece over the phone and informed her that resuscitative efforts had been stopped.  Family members also came to the hospital including the patient's mother and a family friend.  In the  presence of the hospital chaplain and nursing staff,  I informed the patient's family of his passing. An opportunity was provided for the family members to ask questions. Family subsequently viewed the patient while he remained in the ED.  Due to the patient's baseline medical problems, I did not refer this case to the medical examiner. The on-call physician for his PCP was informed of his death. The attending physician will complete the death certificate.  Final Clinical Impressions(s) / ED Diagnoses   Final diagnoses:  Cardiac arrest [facility])   New Prescriptions Discharge Medication List as of 02/11/17 10:44 PM       Levester Fresh, MD 01/28/17 0130    Dione Booze, MD 01/29/17 [phone]

## 2017-02-12 NOTE — Code Documentation (Signed)
Pulse check asystole

## 2017-02-12 NOTE — ED Notes (Signed)
Mother and daughter at bedside with chaplain .

## 2017-02-12 NOTE — Code Documentation (Signed)
Chest compressions continues. 

## 2017-02-12 NOTE — ED Notes (Signed)
Postmortem Care rendered .

## 2017-02-12 NOTE — ED Triage Notes (Addendum)
Patient arrived with EMS from [facility] staff found him unresponsive/pulseless on his bed this evening approx. 7:42 pm -CPR initiated , received 10 doses of Epinephrine  using Intraosseous access at left lower leg / intubated using King airway prior to arrival by EMS. He arrived on a Lucas compression machine.

## 2017-02-12 NOTE — Code Documentation (Signed)
Pulse check - asystole. Chest compressions continues using Lucas machine .

## 2017-02-12 NOTE — Code Documentation (Signed)
Pulse check asystole . Pt. declared dead by Dr. Levester Fresh at 09-02-34.

## 2017-02-12 NOTE — Progress Notes (Signed)
   02/20/2017 2200  Clinical Encounter Type  Visited With Family  Visit Type Death  Referral From Nurse  Consult/Referral To Chaplain  Spiritual Encounters  Spiritual Needs Prayer;Emotional;Grief support  Chaplain met with family in consult room.  Doctor came to pronounce death of PT.  Escorted the family to view the body, prayed with mother of PT.  Funeral home information given to nurse

## 2017-02-12 NOTE — ED Notes (Signed)
[facility] notified on pt.'s death . Referral # [id number] Ronald Simpson representative.

## 2017-02-12 NOTE — Code Documentation (Signed)
Chest compressions Ronald Simpson )continues.

## 2017-02-12 NOTE — ED Notes (Signed)
No pulse/Asystole at arrival .

## 2017-02-12 DEATH — deceased
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
## 2018-06-22 IMAGING — CR DG CHEST 1V PORT
1 series · 1 of 1 positions shown · non-contrast
Comparison: Radiograph 08/04/2016

CLINICAL DATA: Suspected sepsis. Shortness of breath, weakness and
cough today.

EXAM:
PORTABLE CHEST 1 VIEW

[AP]
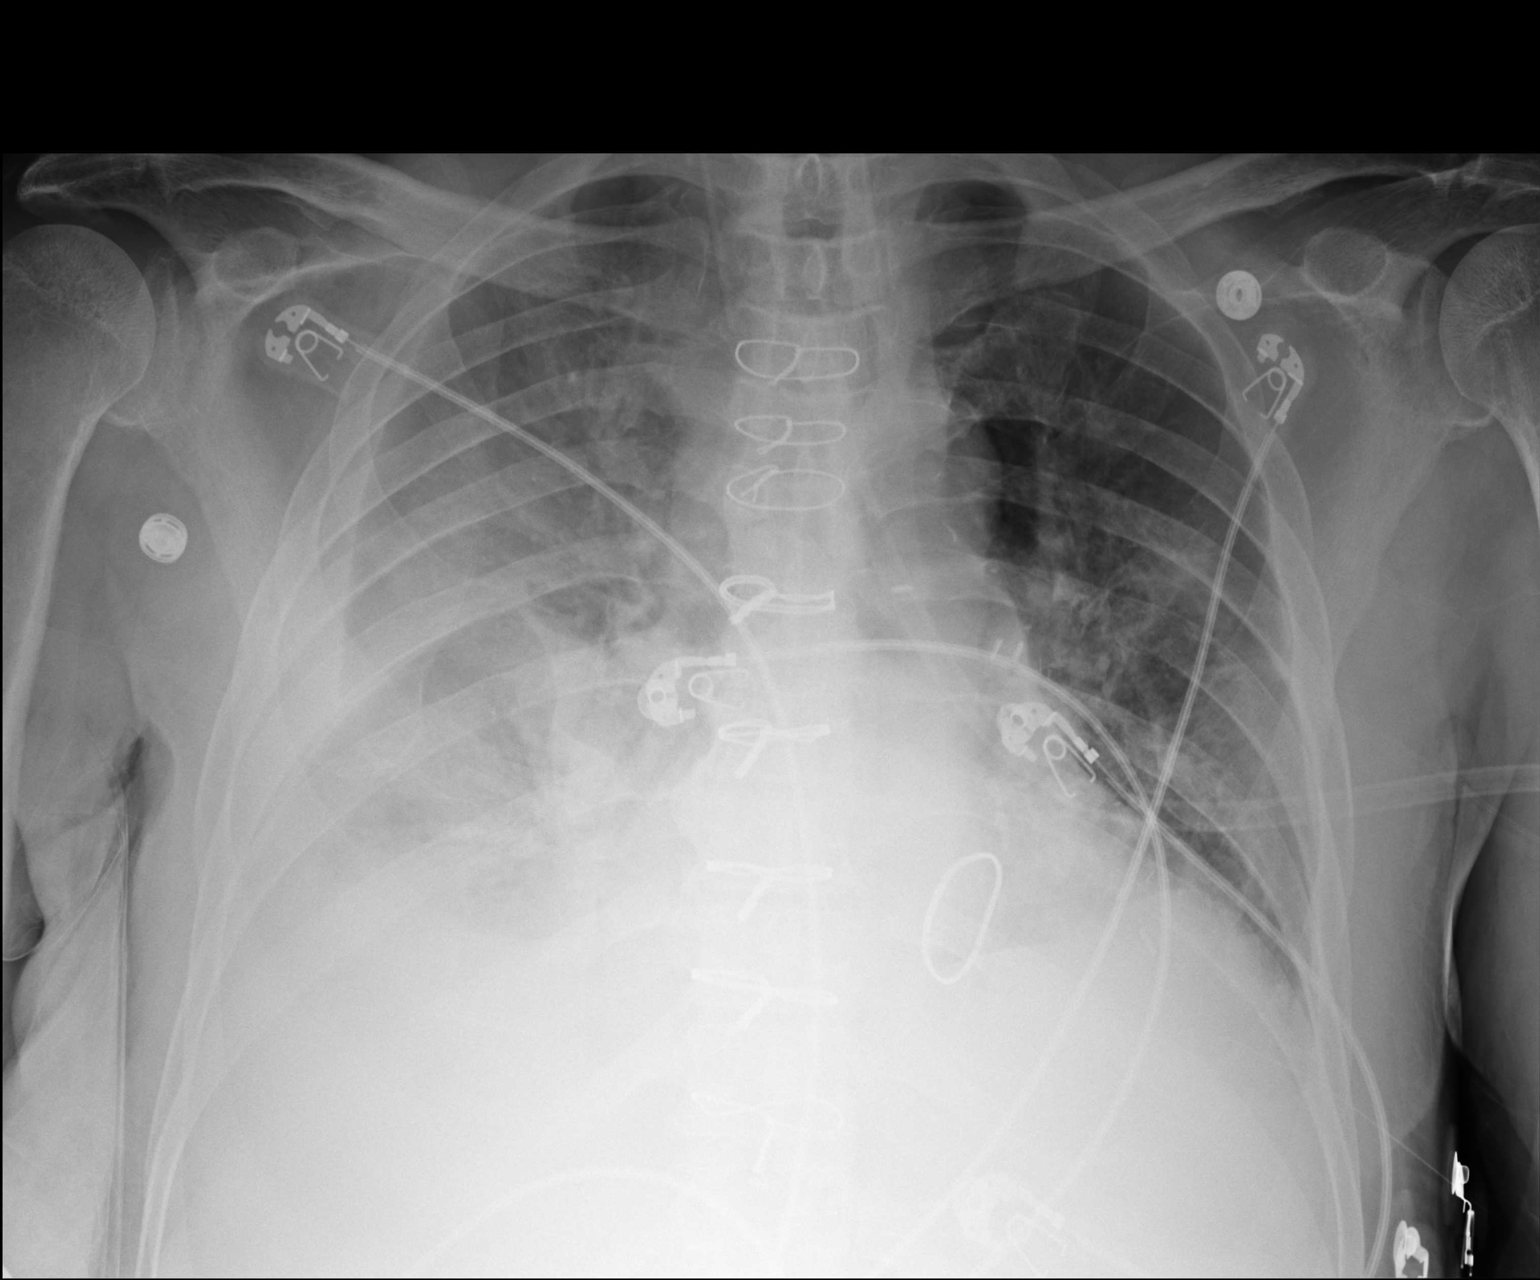

[1 of 1 positions shown; findings below may reference images not displayed]

FINDINGS: Post median sternotomy with prosthetic presumed mitral valve.
Cardiomediastinal contours are grossly stable, partially obscured by
right-sided pulmonary opacity. Increased right pleural effusion with
hazy opacity in the right mid lower lung zone and pleural fluid
tracking about the lateral chest. Suspect small left pleural
effusion. There is vascular congestion and probable perihilar edema.
No pneumothorax.
IMPRESSION: Reaccumulation right pleural effusion, moderate to large in size.
Small left pleural effusion.

Vascular congestion and probable pulmonary edema.

## 2018-06-22 IMAGING — CR DG CHEST 1V PORT
1 series · 1 of 1 positions shown · non-contrast
Comparison: 08/27/2016 at 9929 hours

CLINICAL DATA: Central line placement

EXAM:
PORTABLE CHEST 1 VIEW

[AP]
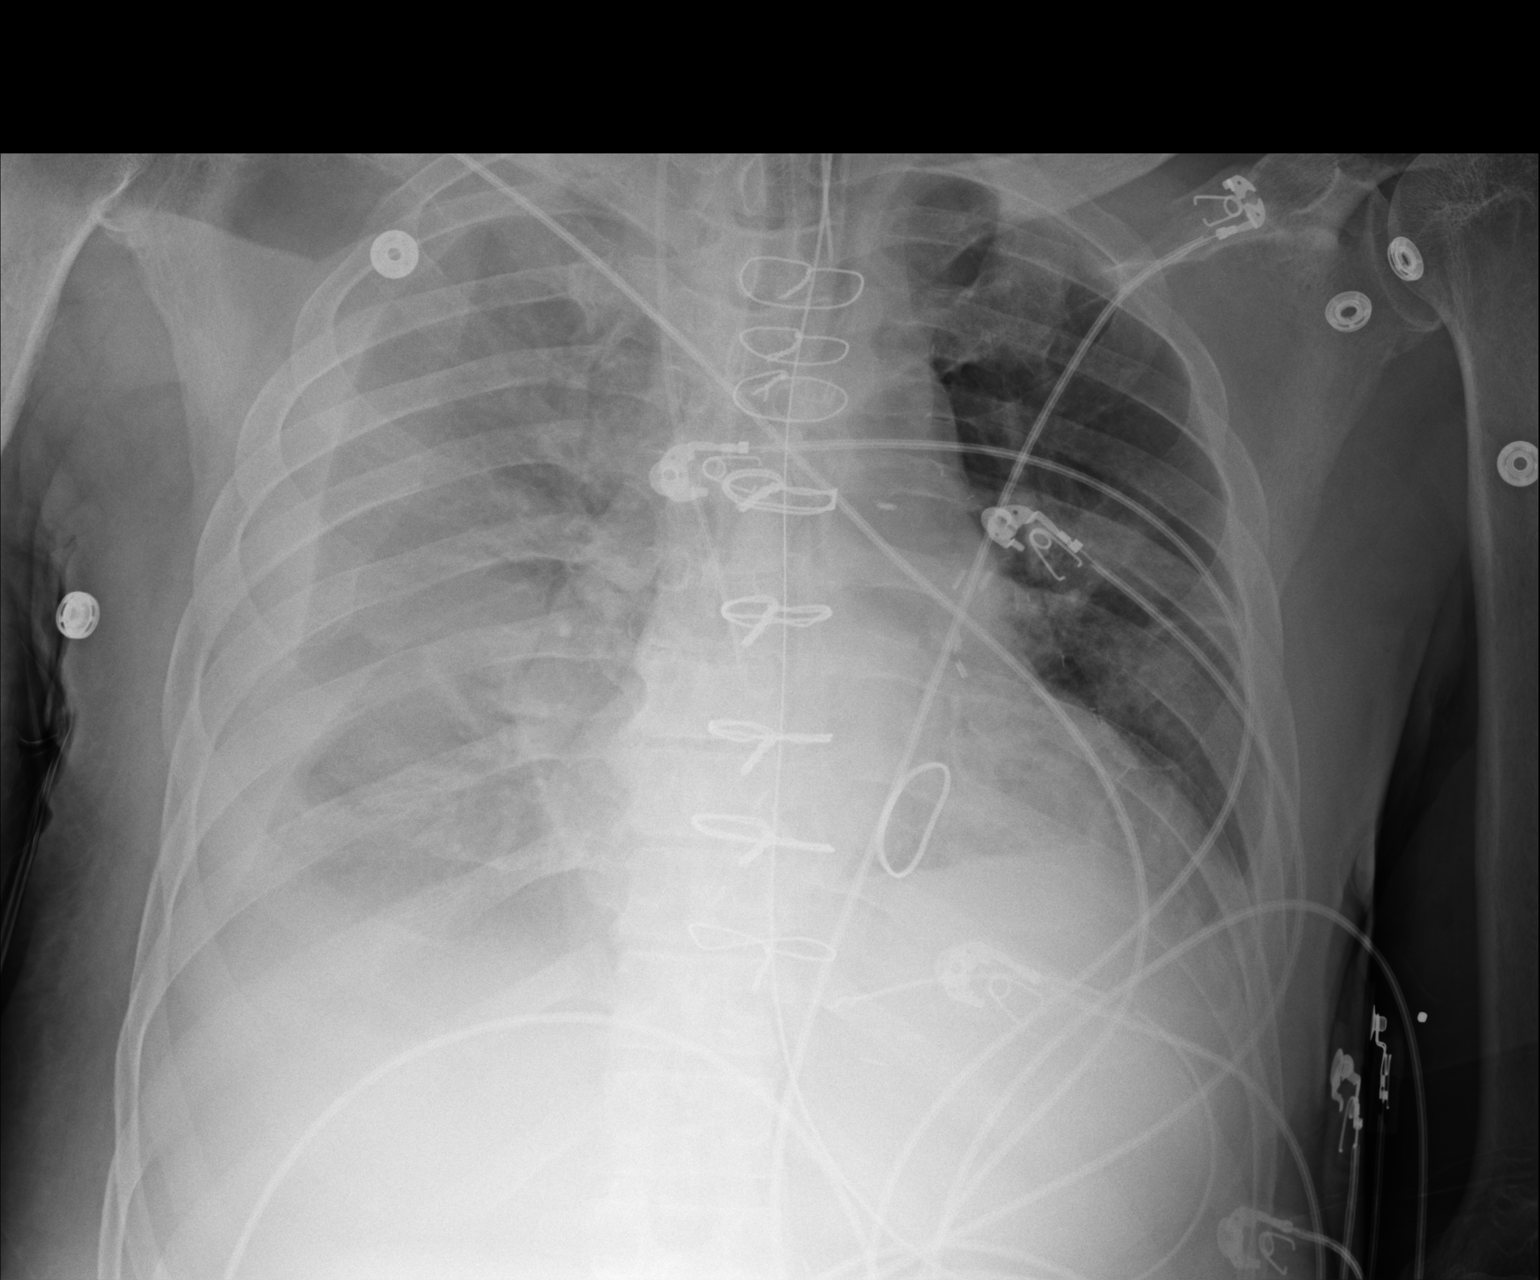

[1 of 1 positions shown; findings below may reference images not displayed]

FINDINGS: New right-sided IJ central line catheter is seen at the cavoatrial
junction. No pneumothorax is noted. Heart is borderline enlarged.
Patient is status post CABG and mitral valvular replacement.
Endotracheal tube is 6 cm above the carina. Gastric tube extends
below the left hemidiaphragm. Loculated pleural effusion is again
seen along the periphery the right lung. Small left effusion is also
present unchanged in appearance.
IMPRESSION: 1. No pneumothorax after right IJ central line placement. The tip is
seen at the cavoatrial junction.
2. Satisfactory support line and tube positions.
3. Bilateral pleural effusions right greater than left with
loculation of fluid along the periphery of the right lung as before.

## 2018-06-22 IMAGING — CR DG CHEST 1V PORT
1 series · 1 of 1 positions shown · non-contrast
Comparison: Earlier today

CLINICAL DATA: Endotracheal tube

EXAM:
PORTABLE CHEST 1 VIEW

[AP]
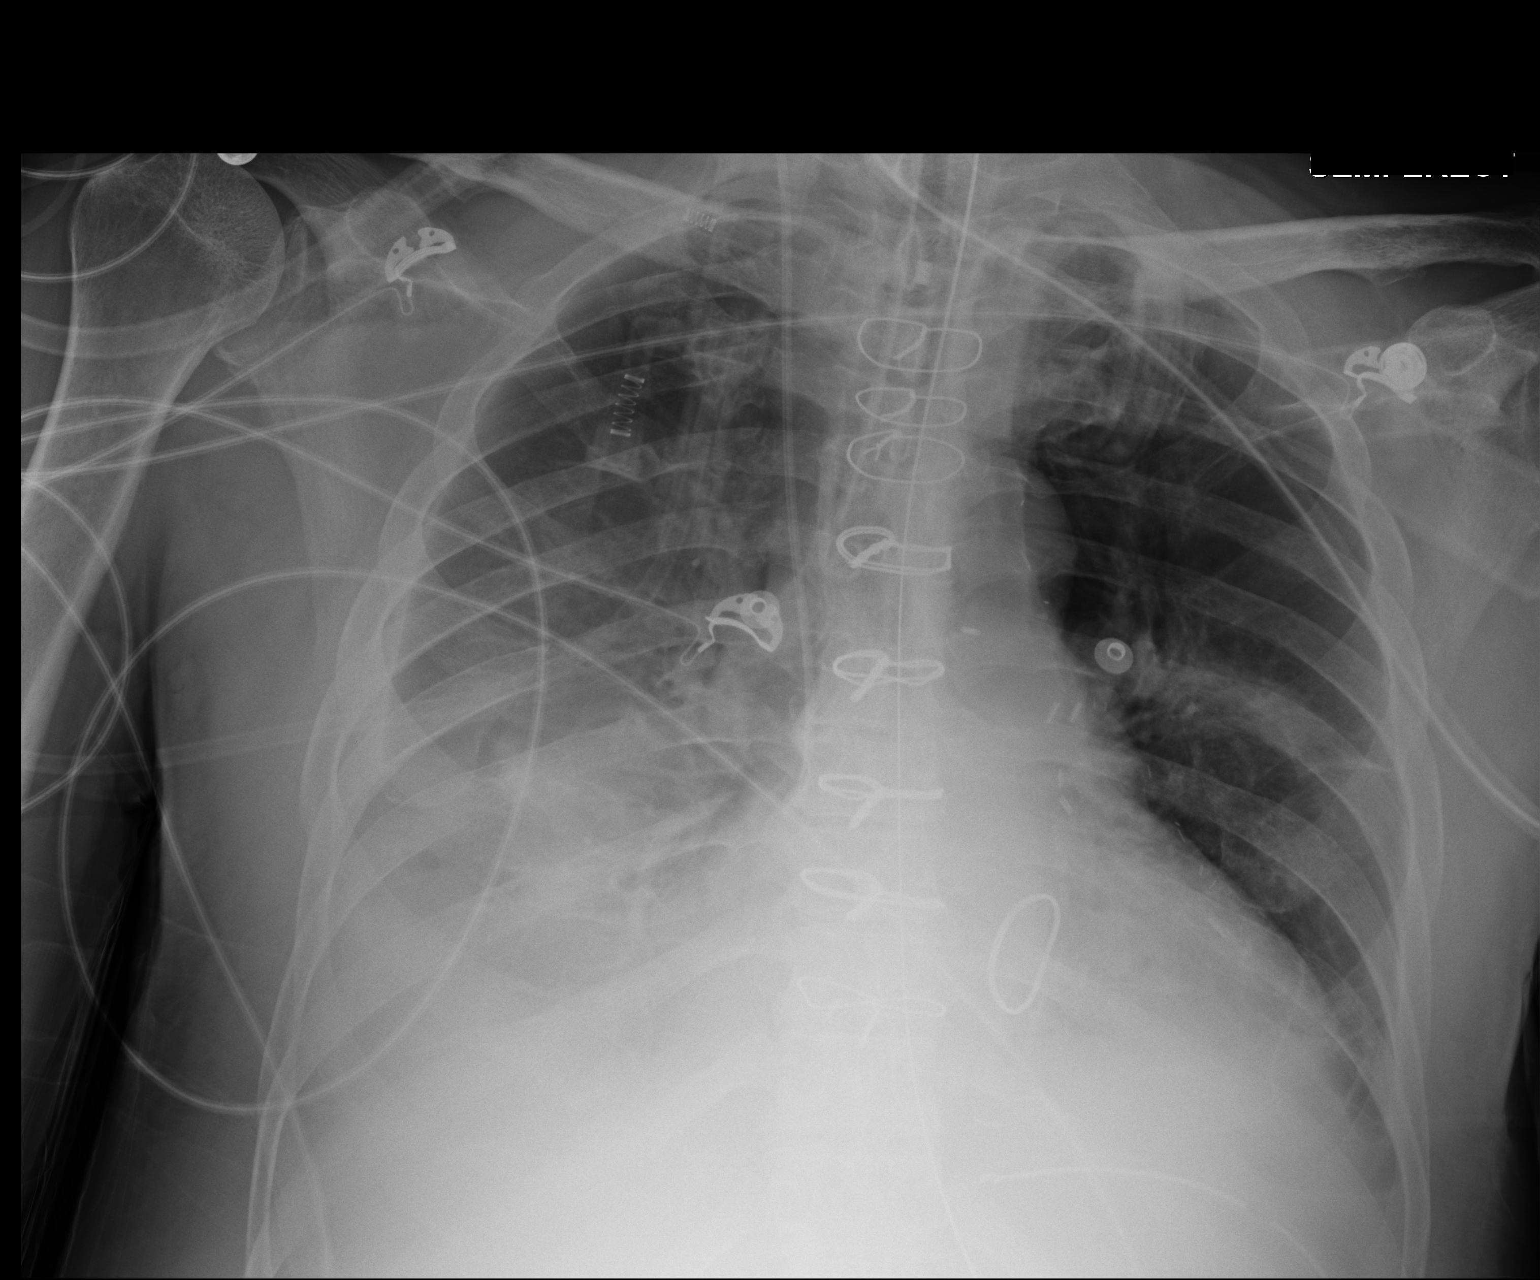

[1 of 1 positions shown; findings below may reference images not displayed]

FINDINGS: Endotracheal tube tip 16 mm above the carina. An orogastric tube is
coiled in the stomach. Right IJ catheter with tip at the upper
cavoatrial junction.

Inferior flow of at least moderate pleural effusion on the right.
Stable postoperative heart size. Much of the right lung is obscured.
No suspected pulmonary edema. No visible pneumothorax.
IMPRESSION: 1. Advanced endotracheal tube, tip 16 mm above the carina.
2. Pleural effusion on the right with inferior flow compared to
earlier today.
